# Patient Record
Sex: Female | Born: 1965 | Race: White | Hispanic: No | Marital: Single | Smoking: Never smoker
Health system: Southern US, Community
[De-identification: ages and names within clinical notes are randomized; demographics above are authoritative.]

## PROBLEM LIST (undated history)

## (undated) DIAGNOSIS — I639 Cerebral infarction, unspecified: Secondary | ICD-10-CM

## (undated) DIAGNOSIS — E7800 Pure hypercholesterolemia, unspecified: Secondary | ICD-10-CM

## (undated) DIAGNOSIS — R569 Unspecified convulsions: Secondary | ICD-10-CM

## (undated) DIAGNOSIS — I509 Heart failure, unspecified: Secondary | ICD-10-CM

## (undated) DIAGNOSIS — F329 Major depressive disorder, single episode, unspecified: Secondary | ICD-10-CM

## (undated) DIAGNOSIS — E785 Hyperlipidemia, unspecified: Secondary | ICD-10-CM

## (undated) DIAGNOSIS — I2510 Atherosclerotic heart disease of native coronary artery without angina pectoris: Secondary | ICD-10-CM

## (undated) DIAGNOSIS — F32A Depression, unspecified: Secondary | ICD-10-CM

## (undated) DIAGNOSIS — G43909 Migraine, unspecified, not intractable, without status migrainosus: Secondary | ICD-10-CM

## (undated) DIAGNOSIS — I472 Ventricular tachycardia, unspecified: Secondary | ICD-10-CM

## (undated) DIAGNOSIS — E669 Obesity, unspecified: Secondary | ICD-10-CM

## (undated) DIAGNOSIS — J45909 Unspecified asthma, uncomplicated: Secondary | ICD-10-CM

## (undated) DIAGNOSIS — I82409 Acute embolism and thrombosis of unspecified deep veins of unspecified lower extremity: Secondary | ICD-10-CM

## (undated) DIAGNOSIS — F419 Anxiety disorder, unspecified: Secondary | ICD-10-CM

## (undated) DIAGNOSIS — M5030 Other cervical disc degeneration, unspecified cervical region: Secondary | ICD-10-CM

## (undated) DIAGNOSIS — T7840XA Allergy, unspecified, initial encounter: Secondary | ICD-10-CM

## (undated) DIAGNOSIS — I469 Cardiac arrest, cause unspecified: Secondary | ICD-10-CM

## (undated) DIAGNOSIS — I10 Essential (primary) hypertension: Secondary | ICD-10-CM

## (undated) DIAGNOSIS — K5790 Diverticulosis of intestine, part unspecified, without perforation or abscess without bleeding: Secondary | ICD-10-CM

## (undated) DIAGNOSIS — K219 Gastro-esophageal reflux disease without esophagitis: Secondary | ICD-10-CM

## (undated) HISTORY — DX: Unspecified asthma, uncomplicated: J45.909

## (undated) HISTORY — PX: ABDOMINAL HYSTERECTOMY: SHX81

## (undated) HISTORY — PX: HAND SURGERY: SHX662

## (undated) HISTORY — DX: Cardiac arrest, cause unspecified: I46.9

## (undated) HISTORY — PX: OVARIAN CYST REMOVAL: SHX89

## (undated) HISTORY — DX: Allergy, unspecified, initial encounter: T78.40XA

---

## 1996-08-25 HISTORY — PX: TUBAL LIGATION: SHX77

## 2004-11-24 ENCOUNTER — Emergency Department: Payer: Self-pay | Admitting: Emergency Medicine

## 2004-11-25 ENCOUNTER — Ambulatory Visit: Payer: Self-pay | Admitting: Emergency Medicine

## 2004-12-04 ENCOUNTER — Ambulatory Visit (HOSPITAL_COMMUNITY): Admission: RE | Admit: 2004-12-04 | Discharge: 2004-12-04 | Payer: Self-pay | Admitting: Gynecology

## 2005-03-27 ENCOUNTER — Emergency Department: Payer: Self-pay | Admitting: Unknown Physician Specialty

## 2005-03-27 ENCOUNTER — Other Ambulatory Visit: Payer: Self-pay

## 2006-05-11 ENCOUNTER — Ambulatory Visit: Payer: Self-pay | Admitting: Gynecology

## 2006-06-09 ENCOUNTER — Ambulatory Visit: Payer: Self-pay | Admitting: Pain Medicine

## 2006-06-17 ENCOUNTER — Ambulatory Visit: Payer: Self-pay | Admitting: Pain Medicine

## 2006-09-08 ENCOUNTER — Ambulatory Visit: Payer: Self-pay | Admitting: Pain Medicine

## 2006-09-21 ENCOUNTER — Ambulatory Visit: Payer: Self-pay | Admitting: Pain Medicine

## 2006-11-03 ENCOUNTER — Ambulatory Visit: Payer: Self-pay | Admitting: Pain Medicine

## 2006-11-11 ENCOUNTER — Ambulatory Visit: Payer: Self-pay | Admitting: Pain Medicine

## 2006-12-24 ENCOUNTER — Ambulatory Visit: Payer: Self-pay | Admitting: Pain Medicine

## 2006-12-28 ENCOUNTER — Ambulatory Visit: Payer: Self-pay | Admitting: Pain Medicine

## 2007-02-09 ENCOUNTER — Ambulatory Visit: Payer: Self-pay | Admitting: Pain Medicine

## 2007-02-15 ENCOUNTER — Ambulatory Visit: Payer: Self-pay | Admitting: Pain Medicine

## 2007-03-25 ENCOUNTER — Ambulatory Visit: Payer: Self-pay | Admitting: Pain Medicine

## 2007-03-29 ENCOUNTER — Ambulatory Visit: Payer: Self-pay | Admitting: Pain Medicine

## 2007-05-20 ENCOUNTER — Ambulatory Visit: Payer: Self-pay | Admitting: Pain Medicine

## 2007-06-02 ENCOUNTER — Ambulatory Visit: Payer: Self-pay | Admitting: Pain Medicine

## 2007-07-08 ENCOUNTER — Ambulatory Visit: Payer: Self-pay | Admitting: Pain Medicine

## 2007-07-14 ENCOUNTER — Ambulatory Visit: Payer: Self-pay | Admitting: Pain Medicine

## 2007-09-07 ENCOUNTER — Ambulatory Visit: Payer: Self-pay | Admitting: Pain Medicine

## 2007-09-20 ENCOUNTER — Ambulatory Visit: Payer: Self-pay | Admitting: Pain Medicine

## 2007-10-07 ENCOUNTER — Ambulatory Visit: Payer: Self-pay | Admitting: Pain Medicine

## 2007-10-11 ENCOUNTER — Ambulatory Visit: Payer: Self-pay | Admitting: Pain Medicine

## 2007-11-04 ENCOUNTER — Ambulatory Visit: Payer: Self-pay | Admitting: Pain Medicine

## 2007-11-08 ENCOUNTER — Ambulatory Visit: Payer: Self-pay | Admitting: Pain Medicine

## 2007-11-21 ENCOUNTER — Encounter: Admission: RE | Admit: 2007-11-21 | Discharge: 2007-11-21 | Payer: Self-pay | Admitting: Neurosurgery

## 2007-12-07 ENCOUNTER — Ambulatory Visit: Payer: Self-pay | Admitting: Pain Medicine

## 2007-12-08 ENCOUNTER — Ambulatory Visit: Payer: Self-pay | Admitting: Pain Medicine

## 2008-01-13 ENCOUNTER — Ambulatory Visit: Payer: Self-pay | Admitting: Pain Medicine

## 2008-01-17 ENCOUNTER — Ambulatory Visit: Payer: Self-pay | Admitting: Pain Medicine

## 2008-02-12 ENCOUNTER — Emergency Department: Payer: Self-pay | Admitting: Emergency Medicine

## 2008-02-17 ENCOUNTER — Ambulatory Visit: Payer: Self-pay | Admitting: Pain Medicine

## 2008-02-23 ENCOUNTER — Ambulatory Visit: Payer: Self-pay | Admitting: Pain Medicine

## 2008-03-14 ENCOUNTER — Ambulatory Visit: Payer: Self-pay | Admitting: Pain Medicine

## 2008-03-14 ENCOUNTER — Emergency Department: Payer: Self-pay | Admitting: Internal Medicine

## 2008-03-20 ENCOUNTER — Ambulatory Visit: Payer: Self-pay | Admitting: Pain Medicine

## 2008-05-09 ENCOUNTER — Ambulatory Visit: Payer: Self-pay | Admitting: Pain Medicine

## 2008-05-15 ENCOUNTER — Ambulatory Visit: Payer: Self-pay | Admitting: Pain Medicine

## 2008-06-26 ENCOUNTER — Ambulatory Visit: Payer: Self-pay | Admitting: Pain Medicine

## 2008-08-08 ENCOUNTER — Ambulatory Visit: Payer: Self-pay | Admitting: Pain Medicine

## 2008-08-14 ENCOUNTER — Ambulatory Visit: Payer: Self-pay | Admitting: Pain Medicine

## 2008-08-28 ENCOUNTER — Ambulatory Visit: Payer: Self-pay | Admitting: Pain Medicine

## 2008-09-28 ENCOUNTER — Ambulatory Visit: Payer: Self-pay | Admitting: Pain Medicine

## 2008-11-01 ENCOUNTER — Ambulatory Visit: Payer: Self-pay | Admitting: Pain Medicine

## 2008-12-04 ENCOUNTER — Ambulatory Visit: Payer: Self-pay | Admitting: Pain Medicine

## 2009-01-01 ENCOUNTER — Ambulatory Visit: Payer: Self-pay | Admitting: Pain Medicine

## 2009-01-25 ENCOUNTER — Ambulatory Visit: Payer: Self-pay | Admitting: Pain Medicine

## 2009-02-28 ENCOUNTER — Ambulatory Visit: Payer: Self-pay | Admitting: Pain Medicine

## 2009-05-02 ENCOUNTER — Ambulatory Visit: Payer: Self-pay | Admitting: Pain Medicine

## 2009-07-23 ENCOUNTER — Ambulatory Visit: Payer: Self-pay | Admitting: Pain Medicine

## 2009-08-22 ENCOUNTER — Ambulatory Visit: Payer: Self-pay | Admitting: Pain Medicine

## 2009-10-08 ENCOUNTER — Ambulatory Visit: Payer: Self-pay | Admitting: Pain Medicine

## 2010-01-14 ENCOUNTER — Ambulatory Visit: Payer: Self-pay | Admitting: Pain Medicine

## 2010-02-14 ENCOUNTER — Ambulatory Visit: Payer: Self-pay | Admitting: Pain Medicine

## 2010-02-18 ENCOUNTER — Ambulatory Visit: Payer: Self-pay | Admitting: Pain Medicine

## 2010-05-15 ENCOUNTER — Emergency Department: Payer: Self-pay | Admitting: Emergency Medicine

## 2010-05-20 ENCOUNTER — Ambulatory Visit: Payer: Self-pay | Admitting: Pain Medicine

## 2010-05-30 ENCOUNTER — Ambulatory Visit: Payer: Self-pay

## 2010-06-19 ENCOUNTER — Ambulatory Visit: Payer: Self-pay | Admitting: Pain Medicine

## 2010-06-21 ENCOUNTER — Ambulatory Visit: Payer: Self-pay | Admitting: Pain Medicine

## 2010-07-09 ENCOUNTER — Ambulatory Visit: Payer: Self-pay | Admitting: Pain Medicine

## 2010-07-15 ENCOUNTER — Ambulatory Visit: Payer: Self-pay | Admitting: Pain Medicine

## 2010-07-16 ENCOUNTER — Encounter: Payer: Self-pay | Admitting: Surgery

## 2010-07-25 ENCOUNTER — Encounter: Payer: Self-pay | Admitting: Surgery

## 2010-08-20 ENCOUNTER — Ambulatory Visit: Payer: Self-pay | Admitting: Pain Medicine

## 2010-08-28 ENCOUNTER — Ambulatory Visit: Payer: Self-pay | Admitting: Pain Medicine

## 2010-08-29 ENCOUNTER — Encounter: Payer: Self-pay | Admitting: Surgery

## 2010-09-25 ENCOUNTER — Encounter: Payer: Self-pay | Admitting: Surgery

## 2010-10-07 ENCOUNTER — Ambulatory Visit: Payer: Self-pay | Admitting: Pain Medicine

## 2010-10-30 ENCOUNTER — Ambulatory Visit: Payer: Self-pay | Admitting: Pain Medicine

## 2010-11-27 ENCOUNTER — Ambulatory Visit: Payer: Self-pay | Admitting: Pain Medicine

## 2010-12-26 ENCOUNTER — Ambulatory Visit: Payer: Self-pay | Admitting: Pain Medicine

## 2010-12-30 ENCOUNTER — Ambulatory Visit: Payer: Self-pay | Admitting: Pain Medicine

## 2010-12-31 ENCOUNTER — Emergency Department (HOSPITAL_COMMUNITY)
Admission: EM | Admit: 2010-12-31 | Discharge: 2010-12-31 | Disposition: A | Discharge status: Home / Self Care | Payer: Medicaid Other | Attending: Emergency Medicine | Admitting: Emergency Medicine

## 2010-12-31 DIAGNOSIS — I10 Essential (primary) hypertension: Secondary | ICD-10-CM | POA: Insufficient documentation

## 2010-12-31 DIAGNOSIS — M549 Dorsalgia, unspecified: Secondary | ICD-10-CM | POA: Insufficient documentation

## 2010-12-31 DIAGNOSIS — F3289 Other specified depressive episodes: Secondary | ICD-10-CM | POA: Insufficient documentation

## 2010-12-31 DIAGNOSIS — F329 Major depressive disorder, single episode, unspecified: Secondary | ICD-10-CM | POA: Insufficient documentation

## 2011-01-08 ENCOUNTER — Emergency Department: Payer: Self-pay | Admitting: Unknown Physician Specialty

## 2011-01-10 ENCOUNTER — Emergency Department: Payer: Self-pay | Admitting: Emergency Medicine

## 2011-01-10 NOTE — Op Note (Signed)
[name], Claire Mcdonald                   ACCOUNT NO.:  [id number]   MEDICAL RECORD NO.:  [id number]          PATIENT TYPE:  AMB   LOCATION:  SDC                           FACILITY:  [facility]   PHYSICIAN:  Ginger Carne, MD  DATE OF BIRTH:  1966-08-04   DATE OF PROCEDURE:  12/04/2004  DATE OF DISCHARGE:                                 OPERATIVE REPORT   PREOPERATIVE DIAGNOSES:  1.  Chronic pelvic pain.  2.  Right ovarian complex cystic mass.   POSTOPERATIVE DIAGNOSES:  1.  Stage I endometriosis.  2.  Right ovarian corpus luteal cyst.   PROCEDURE:  Diagnostic laparoscopy.   SURGEON:  Ginger Carne, MD   ASSISTANT:  None.   COMPLICATIONS:  None immediate.   ESTIMATED BLOOD LOSS:  Negligible.   SPECIMENS:  None.   ASSISTANT:  None.   OPERATIVE FINDINGS:  External genitalia, vulva and vagina normal.  Cervix  smooth without erosions or lesions.  Laparoscopic evaluation demonstrated  stage I endometriosis with red punctate lesions on the broad ligaments  bilaterally posteriorly as well as the uterosacral ligaments and posterior  aspect of the uterus.  Both tubes and ovaries are free of endometriotic  lesions.  The right ovary demonstrated a 2.3 cm corpus luteal cyst that was  intact and hemorrhagic.  The left tube and ovary appeared otherwise normal,  upper abdomen normal, large and small bowel grossly normal, appendix normal.  No evidence of femoral, inguinal or obturator hernias.  Uterine contour was  smooth.   OPERATIVE PROCEDURE:  The patient was prepped and draped in the usual  fashion and placed in lithotomy position, Betadine solution used for  antiseptic, and the patient was catheterized prior to the procedure.  After  adequate general anesthesia, a tenaculum placed on the anterior lip of the  cervix and an acorn tenaculum in the endocervical canal.  A vertical  infraumbilical incision was made and the Veress needle placed in the  abdomen, and opening and closing  pressures were 10-15 mmHg.  A medial  release trocar placed in the same incision and laparoscope placed in the  trocar sleeve.  A 5 mm port was made in the left lower quadrant under direct  visualization.  Inspection of the pelvic contents with photography carried  out, including was the upper abdominal evaluation.  Afterwards gas released, trocars removed.  Closure of the 10 mm fascial site  with 0 Vicryl suture and a 4-0 Vicryl for subcuticular closure.  Instrument  and sponge count were correct.  The patient tolerated the procedure well and  returned to the postanesthesia recovery room in excellent condition.      SHB/MEDQ  D:  12/04/2004  T:  12/04/2004  Job:  [id number]

## 2011-01-10 NOTE — H&P (Signed)
NAME:  Claire Mcdonald, CHANNING NO.:  [id number]   MEDICAL RECORD NO.:  [id number]          PATIENT TYPE:  AMB   LOCATION:  SDC                           [facility]ACILITY:  [facility]   PHYSICIAN:  Ginger Carne, MD  DATE OF BIRTH:  [birth year]   DATE OF ADMISSION:  DATE OF DISCHARGE:                                HISTORY & PHYSICAL   ADMISSION DIAGNOSIS:  Chronic pelvic pain.   HISTORY OF PRESENT ILLNESS:  This patient is a 45 year old gravida 4, para 5-  0-0-4, Caucasian female admitted with a two to three-year history of  worsening bilateral pelvic pain between her menses as well as dysmenorrhea  and dyspareunia.  She noted the onset of increasing discomfort over the past  month and was seen in the emergency room at [facility], at which time a sonogram was performed on November 25, 2004, which  demonstrated a right complex cystic mass measuring 4.6 cm.  She complains of  discomfort primarily on the left side; however, both sides are involved with  discomfort.  The CA125 was 12.7, within normal range.  She has no  genitourinary, gastrointestinal or musculoskeletal sources for her pain.   OBSTETRIC/GYNECOLOGIC HISTORY:  The patient has had four full-term vaginal  deliveries between 1987 and 1998.  She had a tubal ligation postpartum in  1998.   PAST SURGICAL HISTORY:  A left hand surgery in 2005.   SOCIAL HISTORY:  Negative for smoking, illicit drug abuse or alcohol abuse.   ALLERGIES:  None.   CURRENT MEDICATIONS:  Vicodin one to two every four to six hours as needed  for pain.   FAMILY HISTORY:  Father has hypertension.   REVIEW OF SYSTEMS:  Negative.   PAST MEDICAL HISTORY:  Negative.   PHYSICAL EXAMINATION:  GENERAL:  A pleasant-appearing female in no acute  distress.  VITAL SIGNS:  Blood pressure is 132/74, weight 218 pounds, height 5 feet 4  inches.  HEENT:  Grossly normal.  CHEST:  Clear.  CARDIAC:  Without murmurs or enlargements.  BREASTS:   Without masses, discharge, thickenings or tenderness.  EXTREMITIES, LYMPHATIC, SKIN, NEUROLOGIC, MUSCULOSKELETAL:  Normal.  ABDOMEN:  Soft without gross hepatosplenomegaly.  There is tenderness  bilaterally in the lower abdominal quadrants.  PELVIC:  Normal Pap smear.  Urinalysis normal.  External genitalia, vulva  and vagina normal.  Cervix smooth without erosions or lesions.  Uterus is  small, anteverted and flexed, and both adnexa are palpable and found to be  normal.  RECTAL:  Hemoccult-negative without masses.   IMPRESSION:  Chronic pelvic pain with acute exacerbation.   PLAN:  The patient will undergo a diagnostic laparoscopy for purposes of  evaluating the etiology for her pain.  The nature of said procedure  discussed in detail with the patient, all questions answered to the  satisfaction of said patient.      SHB/MEDQ  D:  12/03/2004  T:  12/03/2004  Job:  [id number]

## 2011-01-22 ENCOUNTER — Emergency Department (HOSPITAL_COMMUNITY)
Admission: EM | Admit: 2011-01-22 | Discharge: 2011-01-22 | Disposition: A | Discharge status: Home / Self Care | Payer: Medicaid Other | Attending: Emergency Medicine | Admitting: Emergency Medicine

## 2011-01-22 ENCOUNTER — Emergency Department (HOSPITAL_COMMUNITY): Payer: Medicaid Other

## 2011-01-22 DIAGNOSIS — M549 Dorsalgia, unspecified: Secondary | ICD-10-CM | POA: Insufficient documentation

## 2011-01-22 DIAGNOSIS — F3289 Other specified depressive episodes: Secondary | ICD-10-CM | POA: Insufficient documentation

## 2011-01-22 DIAGNOSIS — I10 Essential (primary) hypertension: Secondary | ICD-10-CM | POA: Insufficient documentation

## 2011-01-22 DIAGNOSIS — N390 Urinary tract infection, site not specified: Secondary | ICD-10-CM | POA: Insufficient documentation

## 2011-01-22 DIAGNOSIS — R109 Unspecified abdominal pain: Secondary | ICD-10-CM | POA: Insufficient documentation

## 2011-01-22 DIAGNOSIS — F329 Major depressive disorder, single episode, unspecified: Secondary | ICD-10-CM | POA: Insufficient documentation

## 2011-01-22 LAB — URINALYSIS, ROUTINE W REFLEX MICROSCOPIC
Glucose, UA: NEGATIVE mg/dL
Ketones, ur: NEGATIVE mg/dL
Nitrite: NEGATIVE
Urobilinogen, UA: 0.2 mg/dL (ref 0.0–1.0)
pH: 5 (ref 5.0–8.0)

## 2011-01-22 LAB — PREGNANCY, URINE: Preg Test, Ur: NEGATIVE

## 2011-01-22 LAB — URINE MICROSCOPIC-ADD ON

## 2011-01-28 ENCOUNTER — Ambulatory Visit: Payer: Self-pay | Admitting: Pain Medicine

## 2012-06-21 ENCOUNTER — Emergency Department: Payer: Self-pay | Admitting: Emergency Medicine

## 2012-06-22 LAB — URINALYSIS, COMPLETE
Bilirubin,UR: NEGATIVE
Blood: NEGATIVE
Glucose,UR: NEGATIVE mg/dL (ref 0–75)
Leukocyte Esterase: NEGATIVE
Nitrite: NEGATIVE
Ph: 7 (ref 4.5–8.0)
Protein: NEGATIVE
Specific Gravity: 1.02 (ref 1.003–1.030)
WBC UR: NONE SEEN /HPF (ref 0–5)

## 2012-07-30 ENCOUNTER — Emergency Department: Payer: Self-pay | Admitting: Emergency Medicine

## 2012-10-12 ENCOUNTER — Emergency Department: Payer: Self-pay | Admitting: Emergency Medicine

## 2012-10-27 ENCOUNTER — Emergency Department: Payer: Self-pay | Admitting: Internal Medicine

## 2012-11-04 ENCOUNTER — Emergency Department: Payer: Self-pay | Admitting: Emergency Medicine

## 2012-11-04 LAB — HCG, QUANTITATIVE, PREGNANCY: Beta Hcg, Quant.: <1 m[IU]/mL — ABNORMAL LOW

## 2012-11-19 ENCOUNTER — Emergency Department: Payer: Self-pay | Admitting: Emergency Medicine

## 2012-11-24 ENCOUNTER — Emergency Department: Payer: Self-pay | Admitting: Emergency Medicine

## 2012-11-24 LAB — COMPREHENSIVE METABOLIC PANEL
Alkaline Phosphatase: 93 U/L (ref 50–136)
BUN: 14 mg/dL (ref 7–18)
Bilirubin,Total: 0.3 mg/dL (ref 0.2–1.0)
Calcium, Total: 8.4 mg/dL — ABNORMAL LOW (ref 8.5–10.1)
Chloride: 107 mmol/L (ref 98–107)
Creatinine: 0.63 mg/dL (ref 0.60–1.30)
EGFR (Non-African Amer.): >60
Glucose: 100 mg/dL — ABNORMAL HIGH (ref 65–99)
Osmolality: 276 (ref 275–301)
SGPT (ALT): 25 U/L (ref 12–78)
Sodium: 138 mmol/L (ref 136–145)
Total Protein: 7.4 g/dL (ref 6.4–8.2)

## 2012-11-24 LAB — URINALYSIS, COMPLETE
Nitrite: NEGATIVE
Ph: 5 (ref 4.5–8.0)
RBC,UR: 1 /HPF (ref 0–5)
Specific Gravity: 1.006 (ref 1.003–1.030)
Squamous Epithelial: 29
WBC UR: 1 /HPF (ref 0–5)

## 2012-11-24 LAB — CBC
HCT: 43.6 % (ref 35.0–47.0)
HGB: 14.7 g/dL (ref 12.0–16.0)
MCH: 30.6 pg (ref 26.0–34.0)
MCHC: 33.8 g/dL (ref 32.0–36.0)
RBC: 4.82 10*6/uL (ref 3.80–5.20)
RDW: 13 % (ref 11.5–14.5)

## 2012-11-30 ENCOUNTER — Emergency Department: Payer: Self-pay | Admitting: Emergency Medicine

## 2012-11-30 LAB — COMPREHENSIVE METABOLIC PANEL
Albumin: 3.2 g/dL — ABNORMAL LOW (ref 3.4–5.0)
Alkaline Phosphatase: 77 U/L (ref 50–136)
Anion Gap: 4 — ABNORMAL LOW (ref 7–16)
BUN: 17 mg/dL (ref 7–18)
Bilirubin,Total: 0.3 mg/dL (ref 0.2–1.0)
Calcium, Total: 8.5 mg/dL (ref 8.5–10.1)
Chloride: 102 mmol/L (ref 98–107)
Creatinine: 0.85 mg/dL (ref 0.60–1.30)
EGFR (African American): >60
EGFR (Non-African Amer.): >60
Glucose: 99 mg/dL (ref 65–99)
Osmolality: 274 (ref 275–301)
SGOT(AST): 21 U/L (ref 15–37)

## 2012-11-30 LAB — URINALYSIS, COMPLETE
Bilirubin,UR: NEGATIVE
Blood: NEGATIVE
Glucose,UR: NEGATIVE mg/dL (ref 0–75)
Ketone: NEGATIVE
Nitrite: NEGATIVE
Ph: 6 (ref 4.5–8.0)
Protein: NEGATIVE
RBC,UR: 1 /HPF (ref 0–5)
Squamous Epithelial: 40

## 2012-11-30 LAB — CBC
HCT: 38.3 % (ref 35.0–47.0)
HGB: 13.3 g/dL (ref 12.0–16.0)
MCHC: 34.6 g/dL (ref 32.0–36.0)
Platelet: 291 10*3/uL (ref 150–440)
RDW: 12.9 % (ref 11.5–14.5)

## 2012-11-30 LAB — LIPASE, BLOOD: Lipase: 163 U/L (ref 73–393)

## 2012-12-02 ENCOUNTER — Ambulatory Visit: Payer: Self-pay

## 2012-12-02 ENCOUNTER — Emergency Department: Payer: Self-pay | Admitting: Unknown Physician Specialty

## 2012-12-02 LAB — URINALYSIS, COMPLETE
Glucose,UR: NEGATIVE mg/dL (ref 0–75)
Ketone: NEGATIVE
Leukocyte Esterase: NEGATIVE
Ph: 8 (ref 4.5–8.0)
RBC,UR: 3 /HPF (ref 0–5)
Specific Gravity: 1.036 (ref 1.003–1.030)
WBC UR: 3 /HPF (ref 0–5)

## 2012-12-02 LAB — COMPREHENSIVE METABOLIC PANEL
Albumin: 3.4 g/dL (ref 3.4–5.0)
Alkaline Phosphatase: 73 U/L (ref 50–136)
Anion Gap: 6 — ABNORMAL LOW (ref 7–16)
BUN: 10 mg/dL (ref 7–18)
Calcium, Total: 8.6 mg/dL (ref 8.5–10.1)
Creatinine: 0.69 mg/dL (ref 0.60–1.30)
EGFR (Non-African Amer.): >60
Glucose: 95 mg/dL (ref 65–99)
Osmolality: 284 (ref 275–301)
Potassium: 3.8 mmol/L (ref 3.5–5.1)
SGOT(AST): 24 U/L (ref 15–37)
Sodium: 143 mmol/L (ref 136–145)
Total Protein: 6.8 g/dL (ref 6.4–8.2)

## 2012-12-02 LAB — LIPASE, BLOOD: Lipase: 135 U/L (ref 73–393)

## 2012-12-02 LAB — CREATININE, SERUM
Creatinine: 0.78 mg/dL (ref 0.60–1.30)
EGFR (Non-African Amer.): >60

## 2012-12-02 LAB — CBC
HCT: 40.5 % (ref 35.0–47.0)
HGB: 13.6 g/dL (ref 12.0–16.0)
MCV: 92 fL (ref 80–100)
Platelet: 316 10*3/uL (ref 150–440)
RBC: 4.4 10*6/uL (ref 3.80–5.20)
RDW: 13.2 % (ref 11.5–14.5)
WBC: 7.7 10*3/uL (ref 3.6–11.0)

## 2012-12-04 ENCOUNTER — Encounter (HOSPITAL_COMMUNITY): Payer: Self-pay | Admitting: Emergency Medicine

## 2012-12-04 ENCOUNTER — Emergency Department: Payer: Self-pay | Admitting: Emergency Medicine

## 2012-12-04 ENCOUNTER — Emergency Department (HOSPITAL_COMMUNITY)
Admission: EM | Admit: 2012-12-04 | Discharge: 2012-12-04 | Disposition: A | Discharge status: Home / Self Care | Payer: Medicaid Other | Attending: Emergency Medicine | Admitting: Emergency Medicine

## 2012-12-04 DIAGNOSIS — Z79899 Other long term (current) drug therapy: Secondary | ICD-10-CM | POA: Insufficient documentation

## 2012-12-04 DIAGNOSIS — Z8639 Personal history of other endocrine, nutritional and metabolic disease: Secondary | ICD-10-CM | POA: Insufficient documentation

## 2012-12-04 DIAGNOSIS — R102 Pelvic and perineal pain: Secondary | ICD-10-CM

## 2012-12-04 DIAGNOSIS — Z3202 Encounter for pregnancy test, result negative: Secondary | ICD-10-CM | POA: Insufficient documentation

## 2012-12-04 DIAGNOSIS — R197 Diarrhea, unspecified: Secondary | ICD-10-CM | POA: Insufficient documentation

## 2012-12-04 DIAGNOSIS — F329 Major depressive disorder, single episode, unspecified: Secondary | ICD-10-CM | POA: Insufficient documentation

## 2012-12-04 DIAGNOSIS — F411 Generalized anxiety disorder: Secondary | ICD-10-CM | POA: Insufficient documentation

## 2012-12-04 DIAGNOSIS — N949 Unspecified condition associated with female genital organs and menstrual cycle: Secondary | ICD-10-CM | POA: Insufficient documentation

## 2012-12-04 DIAGNOSIS — K921 Melena: Secondary | ICD-10-CM | POA: Insufficient documentation

## 2012-12-04 DIAGNOSIS — Z862 Personal history of diseases of the blood and blood-forming organs and certain disorders involving the immune mechanism: Secondary | ICD-10-CM | POA: Insufficient documentation

## 2012-12-04 DIAGNOSIS — I10 Essential (primary) hypertension: Secondary | ICD-10-CM | POA: Insufficient documentation

## 2012-12-04 DIAGNOSIS — F3289 Other specified depressive episodes: Secondary | ICD-10-CM | POA: Insufficient documentation

## 2012-12-04 DIAGNOSIS — R5381 Other malaise: Secondary | ICD-10-CM | POA: Insufficient documentation

## 2012-12-04 DIAGNOSIS — R109 Unspecified abdominal pain: Secondary | ICD-10-CM | POA: Insufficient documentation

## 2012-12-04 DIAGNOSIS — R112 Nausea with vomiting, unspecified: Secondary | ICD-10-CM | POA: Insufficient documentation

## 2012-12-04 HISTORY — DX: Anxiety disorder, unspecified: F41.9

## 2012-12-04 HISTORY — DX: Pure hypercholesterolemia, unspecified: E78.00

## 2012-12-04 HISTORY — DX: Depression, unspecified: F32.A

## 2012-12-04 HISTORY — DX: Major depressive disorder, single episode, unspecified: F32.9

## 2012-12-04 HISTORY — DX: Essential (primary) hypertension: I10

## 2012-12-04 LAB — CBC WITH DIFFERENTIAL/PLATELET
Basophils Relative: 0 % (ref 0–1)
Eosinophils Absolute: 0.2 10*3/uL (ref 0.0–0.7)
HCT: 39.1 % (ref 36.0–46.0)
Hemoglobin: 13.6 g/dL (ref 12.0–15.0)
MCH: 31.2 pg (ref 26.0–34.0)
MCHC: 34.8 g/dL (ref 30.0–36.0)
Monocytes Absolute: 0.6 10*3/uL (ref 0.1–1.0)
Monocytes Relative: 7 % (ref 3–12)
Neutrophils Relative %: 58 % (ref 43–77)
RDW: 12.7 % (ref 11.5–15.5)
WBC: 7.8 10*3/uL (ref 4.0–10.5)

## 2012-12-04 LAB — COMPREHENSIVE METABOLIC PANEL
Albumin: 3.4 g/dL (ref 3.4–5.0)
Alkaline Phosphatase: 77 U/L (ref 50–136)
Bilirubin,Total: 0.2 mg/dL (ref 0.2–1.0)
Calcium, Total: 8.6 mg/dL (ref 8.5–10.1)
Chloride: 110 mmol/L — ABNORMAL HIGH (ref 98–107)
Co2: 25 mmol/L (ref 21–32)
EGFR (African American): >60
EGFR (Non-African Amer.): >60
Glucose: 146 mg/dL — ABNORMAL HIGH (ref 65–99)
Potassium: 3.6 mmol/L (ref 3.5–5.1)
SGOT(AST): 33 U/L (ref 15–37)
SGPT (ALT): 29 U/L (ref 12–78)

## 2012-12-04 LAB — WET PREP, GENITAL: Trich, Wet Prep: NONE SEEN

## 2012-12-04 LAB — POCT I-STAT, CHEM 8
Calcium, Ion: 1.25 mmol/L — ABNORMAL HIGH (ref 1.12–1.23)
Creatinine, Ser: 0.7 mg/dL (ref 0.50–1.10)
Glucose, Bld: 95 mg/dL (ref 70–99)
HCT: 40 % (ref 36.0–46.0)
Hemoglobin: 13.6 g/dL (ref 12.0–15.0)
Potassium: 4 mEq/L (ref 3.5–5.1)
Sodium: 145 mEq/L (ref 135–145)
TCO2: 27 mmol/L (ref 0–100)

## 2012-12-04 LAB — CK TOTAL AND CKMB (NOT AT ARMC): CK, Total: 50 U/L (ref 21–215)

## 2012-12-04 LAB — URINALYSIS, ROUTINE W REFLEX MICROSCOPIC
Bilirubin Urine: NEGATIVE
Glucose, UA: NEGATIVE mg/dL
Hgb urine dipstick: NEGATIVE
Protein, ur: NEGATIVE mg/dL
Urobilinogen, UA: 1 mg/dL (ref 0.0–1.0)

## 2012-12-04 LAB — TROPONIN I: Troponin-I: <0.02 ng/mL

## 2012-12-04 LAB — CBC
HCT: 41.3 % (ref 35.0–47.0)
HGB: 13.5 g/dL (ref 12.0–16.0)
MCH: 30.1 pg (ref 26.0–34.0)
Platelet: 317 10*3/uL (ref 150–440)
WBC: 8.5 10*3/uL (ref 3.6–11.0)

## 2012-12-04 MED ORDER — HYDROMORPHONE HCL PF 1 MG/ML IJ SOLN
1.0000 mg | Freq: Once | INTRAMUSCULAR | Status: AC
  Administered 2012-12-04: 1 mg via INTRAVENOUS
  Filled 2012-12-04: qty 1

## 2012-12-04 MED ORDER — AZITHROMYCIN 250 MG PO TABS
1000.0000 mg | ORAL_TABLET | Freq: Once | ORAL | Status: AC
  Administered 2012-12-04: 1000 mg via ORAL
  Filled 2012-12-04: qty 4

## 2012-12-04 MED ORDER — CEFTRIAXONE SODIUM 250 MG IJ SOLR
250.0000 mg | Freq: Once | INTRAMUSCULAR | Status: AC
  Administered 2012-12-04: 250 mg via INTRAMUSCULAR
  Filled 2012-12-04: qty 250

## 2012-12-04 MED ORDER — PROMETHAZINE HCL 25 MG/ML IJ SOLN
25.0000 mg | Freq: Once | INTRAMUSCULAR | Status: AC
  Administered 2012-12-04: 25 mg via INTRAVENOUS
  Filled 2012-12-04: qty 1

## 2012-12-04 MED ORDER — LIDOCAINE HCL (PF) 1 % IJ SOLN
INTRAMUSCULAR | Status: AC
  Administered 2012-12-04: 0.9 mL
  Filled 2012-12-04: qty 5

## 2012-12-04 NOTE — ED Notes (Signed)
Discharge and follow up instructions reviewed. Pt verbalized understanding.  

## 2012-12-04 NOTE — ED Notes (Signed)
[place]ie PA at bedside 

## 2012-12-04 NOTE — ED Notes (Addendum)
Pt c/o multiple sx. Rt flank pain, hematuria, blood in stool and emesis, and pelvic pain x 2wks. Pt rates pain 10/10.

## 2012-12-04 NOTE — ED Notes (Signed)
Per [facility] pt c/o pelvic pain, rt flank pain, weakness, blood in urine, blood in stool, and blood in emesis x 2wks. Pt is A&O x4, vitals stable. Vitals 120/80, 103 P, 99% RA, Resp 18 and regular. Pt rates pain 10/10. Allergic to zofran, tramadol, and toradol all make her itch and break out in hives. Pt has hx of HTN, High Cholesterol, Panic attacks,depression and neuropathy.

## 2012-12-04 NOTE — ED Provider Notes (Signed)
History     CSN: [id number]  Arrival date & time 12/04/12  1750   First MD Initiated Contact with Patient 12/04/12 1752      Chief Complaint  Patient presents with  . Abdominal Pain  . Pelvic Pain  . Weakness    (Consider location/radiation/quality/duration/timing/severity/associated sxs/prior treatment) HPI  47 year old female presents complaining of abdominal and pelvis pain. Patient reports for the past 2 weeks she has had persistent pain to her low abdomen. Describe pain as an achy sensation, persistent, nonradiating, 10 out of 10, worsening with sexual activities. She also report having blood in the urine and blood in the stool. She has had nausea, vomiting, and diarrhea as well. Saw trace of blood in her emesis. She also endorsed fever as high as 104 last night, improves after she took a Tylenol. Patient denies headache, sneezing, coughing, chest pain, shortness of breath, dysuria, vaginal discharge, or rash. No prior history of STD. Does have prior history of ovarian cyst.  Past Medical History  Diagnosis Date  . Hypertension   . High cholesterol   . Anxiety   . Depression     Past Surgical History  Procedure Laterality Date  . Tubal ligation  1998    History reviewed. No pertinent family history.  History  Substance Use Topics  . Smoking status: Never Smoker   . Smokeless tobacco: Never Used  . Alcohol Use: No    OB History   Grav Para Term Preterm Abortions TAB SAB Ect Mult Living                  Review of Systems  Constitutional:       10 Systems reviewed and all are negative for acute change except as noted in the HPI.     Allergies  Toradol; Tramadol; and Zofran  Home Medications   Current Outpatient Rx  Name  Route  Sig  Dispense  Refill  . acetaminophen (TYLENOL) 500 MG tablet   Oral   Take 500 mg by mouth every 6 (six) hours as needed for pain.         . DULoxetine (CYMBALTA) 60 MG capsule   Oral   Take 60 mg by mouth daily.        . furosemide (LASIX) 40 MG tablet   Oral   Take 40 mg by mouth daily.         Marland Kitchen gabapentin (NEURONTIN) 300 MG capsule   Oral   Take 300 mg by mouth daily.         Marland Kitchen ibuprofen (ADVIL,MOTRIN) 200 MG tablet   Oral   Take 400 mg by mouth every 6 (six) hours as needed for pain.         . metoprolol succinate (TOPROL-XL) 100 MG 24 hr tablet   Oral   Take 100 mg by mouth daily. Take with or immediately following a meal.         . naproxen sodium (ANAPROX) 220 MG tablet   Oral   Take 440 mg by mouth as needed (pain).         . topiramate (TOPAMAX) 100 MG tablet   Oral   Take 100 mg by mouth 2 (two) times daily.           BP 128/76  Pulse 91  Temp(Src) 98.6 F (37 C) (Oral)  Resp 23  SpO2 99%  Physical Exam  Nursing note and vitals reviewed. Constitutional: She appears well-developed and well-nourished. No distress.  HENT:  Head: Normocephalic and atraumatic.  Eyes: Conjunctivae are normal.  Neck: Normal range of motion. Neck supple.  Cardiovascular: Normal rate and regular rhythm.   Pulmonary/Chest: Effort normal and breath sounds normal. She exhibits no tenderness.  Abdominal: Soft. There is no tenderness (Suprapubic tenderness, and left lower quadrant tenderness on palpation without guarding or rebound tenderness. No hernia noted. No overlying skin changes noted.). There is no rebound and no guarding.  Genitourinary: Vagina normal and uterus normal. There is no rash or lesion on the right labia. There is no rash or lesion on the left labia. Cervix exhibits motion tenderness. Cervix exhibits no discharge and no friability. Right adnexum displays tenderness. Right adnexum displays no mass. Left adnexum displays tenderness. Left adnexum displays no mass. No erythema, tenderness or bleeding around the vagina. No vaginal discharge found.  Chaperone present  No CVA tenderness.  Lymphadenopathy:       Right: No inguinal adenopathy present.       Left: No inguinal  adenopathy present.    ED Course  Procedures (including critical care time)  6:50 PM Patient presents with pelvic pain for the past 2 weeks. She does have cervical motion tenderness on exam. Examination was difficult due to large body habitus. Patient is afebrile with stable normal vital signs. I do not suspect tubo-ovarian abscess, or ovarian torsion at this time.  7:54 PM Although patient complained of bloody urine and blood in her stool,UA shows no evidence of hematuria no evidence of UTI. Hemoccult is negative. Patient has normal white count with normal hemoglobin. A wet preps results are unremarkable. She has normal electrolytes and a pregnancy test is negative. She is afebrile with stable normal vital sign. At this time patient has no concerning finding warrant for further evaluation. Patient is stable for discharge.  Labs Reviewed  WET PREP, GENITAL - Abnormal; Notable for the following:    WBC, Wet Prep HPF POC FEW (*)    All other components within normal limits  POCT I-STAT, CHEM 8 - Abnormal; Notable for the following:    Calcium, Ion 1.25 (*)    All other components within normal limits  GC/CHLAMYDIA PROBE AMP  CBC WITH DIFFERENTIAL  URINALYSIS, ROUTINE W REFLEX MICROSCOPIC  PREGNANCY, URINE   No results found.   1. Pelvic pain       MDM  BP 128/76  Pulse 91  Temp(Src) 98.6 F (37 C) (Oral)  Resp 23  SpO2 99%  I have reviewed nursing notes and vital signs.  I reviewed available ER/hospitalization records thought the EMR         Fayrene Helper, [place] 12/04/12 1958

## 2012-12-05 ENCOUNTER — Inpatient Hospital Stay (HOSPITAL_COMMUNITY)
Admission: AD | Admit: 2012-12-05 | Discharge: 2012-12-05 | Disposition: A | Discharge status: Home / Self Care | Payer: Medicaid Other | Source: Ambulatory Visit | Attending: Obstetrics and Gynecology | Admitting: Obstetrics and Gynecology

## 2012-12-05 ENCOUNTER — Inpatient Hospital Stay (HOSPITAL_COMMUNITY): Payer: Medicaid Other

## 2012-12-05 ENCOUNTER — Encounter (HOSPITAL_COMMUNITY): Payer: Self-pay | Admitting: *Deleted

## 2012-12-05 DIAGNOSIS — R197 Diarrhea, unspecified: Secondary | ICD-10-CM | POA: Insufficient documentation

## 2012-12-05 DIAGNOSIS — R102 Pelvic and perineal pain: Secondary | ICD-10-CM

## 2012-12-05 DIAGNOSIS — N949 Unspecified condition associated with female genital organs and menstrual cycle: Secondary | ICD-10-CM | POA: Insufficient documentation

## 2012-12-05 DIAGNOSIS — R109 Unspecified abdominal pain: Secondary | ICD-10-CM | POA: Insufficient documentation

## 2012-12-05 DIAGNOSIS — R112 Nausea with vomiting, unspecified: Secondary | ICD-10-CM | POA: Insufficient documentation

## 2012-12-05 LAB — URINALYSIS, COMPLETE
Ketone: NEGATIVE
Protein: NEGATIVE
RBC,UR: NONE SEEN /HPF (ref 0–5)
Specific Gravity: 1.012 (ref 1.003–1.030)
WBC UR: 1 /HPF (ref 0–5)

## 2012-12-05 MED ORDER — OXYCODONE-ACETAMINOPHEN 5-325 MG PO TABS
1.0000 | ORAL_TABLET | Freq: Once | ORAL | Status: AC
  Administered 2012-12-05: 1 via ORAL
  Filled 2012-12-05: qty 1

## 2012-12-05 MED ORDER — PROMETHAZINE HCL 25 MG PO TABS
25.0000 mg | ORAL_TABLET | Freq: Once | ORAL | Status: AC
  Administered 2012-12-05: 25 mg via ORAL
  Filled 2012-12-05: qty 1

## 2012-12-05 NOTE — ED Provider Notes (Signed)
Medical screening examination/treatment/procedure(s) were conducted as a shared visit with non-physician practitioner(s) and myself.  I personally evaluated the patient during the encounter.  Pelvic pain with normal VS and normal exam.   Labs unremarkable  Donnetta Hutching, MD 12/05/12 1221

## 2012-12-05 NOTE — MAU Note (Signed)
Pt reports abdominal pain and diarrhea.

## 2012-12-05 NOTE — MAU Note (Signed)
t reports she has had increased pelvic pain x 2 weeks and stomach and darrhea x 3 days. Also reports having n/v x 2-3 days (stated she is throwing up blood). Was seen at [facility] yesterday and the sent her home not knowing what was wrong.   

## 2012-12-05 NOTE — MAU Provider Note (Signed)
History     CSN: [id number]  Arrival date and time: 12/05/12 5366   First Provider Initiated Contact with Patient 12/05/12 1857      Chief Complaint  Patient presents with  . Pelvic Pain   HPI This is a 47 y.o. female who presents with c/o pelvic pain for 2 weeks. Had her period a week ago. Started having nausea and vomiting several days ago followed by Diarrhea.  Seen at [facility] yesterday and states they did nothing for her.  Also states her periods have been irregular and she has milk leaking from breasts.  States her family doctor doesn't know what is wrong with her.   RN Note: reports she has had increased pelvic pain x 2 weeks and stomach and darrhea x 3 days. Also reports having n/v x 2-3 days (stated she is throwing up blood). Was seen at [facility] yesterday and the sent her home not knowing what was wrong.       OB History   Grav Para Term Preterm Abortions TAB SAB Ect Mult Living                  Past Medical History  Diagnosis Date  . Hypertension   . High cholesterol   . Anxiety   . Depression     Past Surgical History  Procedure Laterality Date  . Tubal ligation  1998    Family History  Problem Relation Age of Onset  . Hypertension Father   . Diabetes Father   . Hyperlipidemia Neg Hx   . Heart disease Neg Hx   . Stroke Neg Hx   . Cancer Neg Hx     History  Substance Use Topics  . Smoking status: Never Smoker   . Smokeless tobacco: Never Used  . Alcohol Use: No    Allergies:  Allergies  Allergen Reactions  . Toradol (Ketorolac Tromethamine) Itching  . Tramadol Itching  . Zofran (Ondansetron Hcl) Itching    Prescriptions prior to admission  Medication Sig Dispense Refill  . acetaminophen (TYLENOL) 500 MG tablet Take 500 mg by mouth every 6 (six) hours as needed for pain.      . DULoxetine (CYMBALTA) 60 MG capsule Take 60 mg by mouth daily.      . furosemide (LASIX) 40 MG tablet Take 40 mg by mouth daily.      Marland Kitchen gabapentin (NEURONTIN) 300 MG  capsule Take 300 mg by mouth daily.      Marland Kitchen ibuprofen (ADVIL,MOTRIN) 200 MG tablet Take 400 mg by mouth every 6 (six) hours as needed for pain.      . metoprolol succinate (TOPROL-XL) 100 MG 24 hr tablet Take 100 mg by mouth daily. Take with or immediately following a meal.      . naproxen sodium (ANAPROX) 220 MG tablet Take 440 mg by mouth as needed (pain).      . topiramate (TOPAMAX) 100 MG tablet Take 100 mg by mouth 2 (two) times daily.        Review of Systems  Constitutional: Negative for fever, chills and malaise/fatigue.  Gastrointestinal: Positive for abdominal pain, diarrhea and blood in stool. Negative for nausea, vomiting and constipation.  Genitourinary: Negative for dysuria.  Neurological: Negative for focal weakness, weakness and headaches.   Physical Exam   Blood pressure 133/89, pulse 117, temperature 98.2 F (36.8 C), temperature source Oral, resp. rate 18, height 4\' 11"  (1.499 m), weight 221 lb (100.245 kg), last menstrual period 11/28/2012.  Physical Exam  Constitutional:  She is oriented to person, place, and time. She appears well-developed and well-nourished. No distress.  HENT:  Head: Normocephalic.  Cardiovascular: Normal rate.   Respiratory: Effort normal.  GI: Soft. She exhibits no distension and no mass. There is tenderness. There is no rebound and no guarding.  Genitourinary: Vagina normal. Uterus is tender (Exam limited by habitus). Cervix exhibits motion tenderness. Right adnexum displays tenderness. Left adnexum displays tenderness. No vaginal discharge found.  Musculoskeletal: Normal range of motion.  Neurological: She is alert and oriented to person, place, and time.  Skin: Skin is warm and dry.  Psychiatric: She has a normal mood and affect.    MAU Course  Procedures  MDM Pelvic US ordered.   Discussed with Dr Emelda Fear. Will medicate with Percocet for now.   *RADIOLOGY REPORT*  Clinical Data: Pelvic pain.  TRANSABDOMINAL AND TRANSVAGINAL  ULTRASOUND OF PELVIS  Technique: Both transabdominal and transvaginal ultrasound  examinations of the pelvis were performed. Transabdominal technique  was performed for global imaging of the pelvis including uterus,  ovaries, adnexal regions, and pelvic cul-de-sac.  It was necessary to proceed with endovaginal exam following the  transabdominal exam to visualize the uterus and endometrium.  Comparison: CT abdomen and pelvis 01/22/2011 appear  Findings:  Uterus: Normal in size and appearance  Endometrium: Normal in thickness and appearance  Right ovary: Normal appearance/no adnexal mass  Left ovary: Normal appearance/no adnexal mass  Other findings: No free fluid  IMPRESSION:  Normal study. No evidence of pelvic mass or other significant  abnormality.  Original Report Authenticated By: Holley Dexter, M.D.   Assessment and Plan  Report to Zorita Pang CNM  [facility] 12/05/2012, 7:18 PM   1. Female pelvic pain    Nl ultrasound findings Discussed multiple etiologies of pelvic pain Offered appointment in GYN clinic Pt declines, plans to FU with PCP

## 2012-12-06 ENCOUNTER — Emergency Department: Payer: Self-pay | Admitting: Emergency Medicine

## 2012-12-06 LAB — URINALYSIS, COMPLETE
Bilirubin,UR: NEGATIVE
Glucose,UR: NEGATIVE mg/dL (ref 0–75)
Ketone: NEGATIVE
Protein: NEGATIVE
Specific Gravity: 1.018 (ref 1.003–1.030)
Squamous Epithelial: 43
WBC UR: 1 /HPF (ref 0–5)

## 2012-12-06 LAB — GC/CHLAMYDIA PROBE AMP
CT Probe RNA: NEGATIVE
GC Probe RNA: NEGATIVE

## 2012-12-06 LAB — COMPREHENSIVE METABOLIC PANEL
Albumin: 3.2 g/dL — ABNORMAL LOW (ref 3.4–5.0)
Anion Gap: 8 (ref 7–16)
Bilirubin,Total: 0.2 mg/dL (ref 0.2–1.0)
Calcium, Total: 8.3 mg/dL — ABNORMAL LOW (ref 8.5–10.1)
Creatinine: 0.73 mg/dL (ref 0.60–1.30)
EGFR (Non-African Amer.): >60
Potassium: 3.5 mmol/L (ref 3.5–5.1)
SGOT(AST): 62 U/L — ABNORMAL HIGH (ref 15–37)
SGPT (ALT): 52 U/L (ref 12–78)
Sodium: 141 mmol/L (ref 136–145)
Total Protein: 6.7 g/dL (ref 6.4–8.2)

## 2012-12-06 LAB — CBC
HGB: 13 g/dL (ref 12.0–16.0)
MCH: 31 pg (ref 26.0–34.0)
MCV: 92 fL (ref 80–100)
Platelet: 291 10*3/uL (ref 150–440)
RBC: 4.19 10*6/uL (ref 3.80–5.20)
WBC: 6.9 10*3/uL (ref 3.6–11.0)

## 2012-12-07 NOTE — MAU Provider Note (Signed)
Attestation of Attending Supervision of Advanced Practitioner: Evaluation and management procedures were performed by the PA/NP/CNM/OB Fellow under my supervision/collaboration. Chart reviewed and agree with management and plan.  Aliveah Gallant V 12/07/2012 12:07 PM

## 2012-12-26 ENCOUNTER — Encounter (HOSPITAL_COMMUNITY): Payer: Self-pay | Admitting: Nurse Practitioner

## 2012-12-26 ENCOUNTER — Emergency Department (HOSPITAL_COMMUNITY): Payer: Medicaid Other

## 2012-12-26 ENCOUNTER — Emergency Department: Payer: Self-pay | Admitting: Emergency Medicine

## 2012-12-26 ENCOUNTER — Emergency Department (HOSPITAL_COMMUNITY)
Admission: EM | Admit: 2012-12-26 | Discharge: 2012-12-26 | Disposition: A | Discharge status: Home / Self Care | Payer: Medicaid Other | Attending: Emergency Medicine | Admitting: Emergency Medicine

## 2012-12-26 DIAGNOSIS — R197 Diarrhea, unspecified: Secondary | ICD-10-CM | POA: Insufficient documentation

## 2012-12-26 DIAGNOSIS — F411 Generalized anxiety disorder: Secondary | ICD-10-CM | POA: Insufficient documentation

## 2012-12-26 DIAGNOSIS — F329 Major depressive disorder, single episode, unspecified: Secondary | ICD-10-CM | POA: Insufficient documentation

## 2012-12-26 DIAGNOSIS — Z8639 Personal history of other endocrine, nutritional and metabolic disease: Secondary | ICD-10-CM | POA: Insufficient documentation

## 2012-12-26 DIAGNOSIS — Z79899 Other long term (current) drug therapy: Secondary | ICD-10-CM | POA: Insufficient documentation

## 2012-12-26 DIAGNOSIS — Z862 Personal history of diseases of the blood and blood-forming organs and certain disorders involving the immune mechanism: Secondary | ICD-10-CM | POA: Insufficient documentation

## 2012-12-26 DIAGNOSIS — R109 Unspecified abdominal pain: Secondary | ICD-10-CM

## 2012-12-26 DIAGNOSIS — I10 Essential (primary) hypertension: Secondary | ICD-10-CM | POA: Insufficient documentation

## 2012-12-26 DIAGNOSIS — Z3202 Encounter for pregnancy test, result negative: Secondary | ICD-10-CM | POA: Insufficient documentation

## 2012-12-26 DIAGNOSIS — R110 Nausea: Secondary | ICD-10-CM | POA: Insufficient documentation

## 2012-12-26 DIAGNOSIS — F3289 Other specified depressive episodes: Secondary | ICD-10-CM | POA: Insufficient documentation

## 2012-12-26 DIAGNOSIS — R1031 Right lower quadrant pain: Secondary | ICD-10-CM | POA: Insufficient documentation

## 2012-12-26 LAB — URINALYSIS, MICROSCOPIC ONLY
Glucose, UA: NEGATIVE mg/dL
Leukocytes, UA: NEGATIVE
Nitrite: NEGATIVE
Protein, ur: NEGATIVE mg/dL
Urobilinogen, UA: 0.2 mg/dL (ref 0.0–1.0)

## 2012-12-26 LAB — COMPREHENSIVE METABOLIC PANEL
AST: 22 U/L (ref 0–37)
Albumin: 3.7 g/dL (ref 3.4–5.0)
Albumin: 3.8 g/dL (ref 3.5–5.2)
Alkaline Phosphatase: 85 U/L (ref 39–117)
Anion Gap: 5 — ABNORMAL LOW (ref 7–16)
BUN: 19 mg/dL (ref 6–23)
CO2: 24 mEq/L (ref 19–32)
Calcium, Total: 8.9 mg/dL (ref 8.5–10.1)
Calcium: 9.3 mg/dL (ref 8.4–10.5)
Chloride: 109 mmol/L — ABNORMAL HIGH (ref 98–107)
Chloride: 106 mEq/L (ref 96–112)
Co2: 28 mmol/L (ref 21–32)
Creatinine, Ser: 0.62 mg/dL (ref 0.50–1.10)
Creatinine: 0.68 mg/dL (ref 0.60–1.30)
EGFR (Non-African Amer.): >60
GFR calc non Af Amer: >90 mL/min (ref >90)
Glucose, Bld: 94 mg/dL (ref 70–99)
Glucose: 107 mg/dL — ABNORMAL HIGH (ref 65–99)
Osmolality: 286 (ref 275–301)
Potassium: 3.7 mmol/L (ref 3.5–5.1)
SGOT(AST): 25 U/L (ref 15–37)
SGPT (ALT): 30 U/L (ref 12–78)
Sodium: 142 mmol/L (ref 136–145)
Total Protein: 7.6 g/dL (ref 6.4–8.2)

## 2012-12-26 LAB — URINALYSIS, COMPLETE
Ketone: NEGATIVE
Leukocyte Esterase: NEGATIVE
Ph: 6 (ref 4.5–8.0)
Protein: NEGATIVE
RBC,UR: 2 /HPF (ref 0–5)
Specific Gravity: 1.024 (ref 1.003–1.030)

## 2012-12-26 LAB — CBC
HCT: 42.8 % (ref 35.0–47.0)
HGB: 14.2 g/dL (ref 12.0–16.0)
MCH: 29.9 pg (ref 26.0–34.0)
MCHC: 33.3 g/dL (ref 32.0–36.0)
Platelet: 410 10*3/uL (ref 150–440)
RDW: 12.7 % (ref 11.5–14.5)

## 2012-12-26 LAB — CBC WITH DIFFERENTIAL/PLATELET
Basophils Absolute: 0 10*3/uL (ref 0.0–0.1)
Basophils Relative: 0 % (ref 0–1)
Eosinophils Relative: 3 % (ref 0–5)
HCT: 41.6 % (ref 36.0–46.0)
Lymphs Abs: 3.6 10*3/uL (ref 0.7–4.0)
MCH: 30.4 pg (ref 26.0–34.0)
MCHC: 34.4 g/dL (ref 30.0–36.0)
MCV: 88.5 fL (ref 78.0–100.0)
Monocytes Absolute: 0.5 10*3/uL (ref 0.1–1.0)
Neutro Abs: 6.9 10*3/uL (ref 1.7–7.7)
RDW: 12.6 % (ref 11.5–15.5)

## 2012-12-26 LAB — LIPASE, BLOOD
Lipase: 178 U/L (ref 73–393)
Lipase: 43 U/L (ref 11–59)

## 2012-12-26 LAB — WET PREP, GENITAL
Clue Cells Wet Prep HPF POC: NONE SEEN
WBC, Wet Prep HPF POC: NONE SEEN

## 2012-12-26 MED ORDER — HYDROMORPHONE HCL PF 1 MG/ML IJ SOLN
1.0000 mg | Freq: Once | INTRAMUSCULAR | Status: AC
  Administered 2012-12-26: 1 mg via INTRAVENOUS
  Filled 2012-12-26: qty 1

## 2012-12-26 MED ORDER — PROMETHAZINE HCL 25 MG PO TABS: 25.0000 mg | ORAL_TABLET | Freq: Four times a day (QID) | ORAL | Status: DC | PRN

## 2012-12-26 MED ORDER — ONDANSETRON HCL 4 MG/2ML IJ SOLN
4.0000 mg | Freq: Once | INTRAMUSCULAR | Status: AC
  Administered 2012-12-26: 4 mg via INTRAVENOUS
  Filled 2012-12-26: qty 2

## 2012-12-26 MED ORDER — OXYCODONE-ACETAMINOPHEN 5-325 MG PO TABS
1.0000 | ORAL_TABLET | Freq: Once | ORAL | Status: AC
  Administered 2012-12-26: 1 via ORAL
  Filled 2012-12-26: qty 1

## 2012-12-26 MED ORDER — IOHEXOL 300 MG/ML  SOLN: 50.0000 mL | Freq: Once | INTRAMUSCULAR | Status: DC | PRN

## 2012-12-26 MED ORDER — MORPHINE SULFATE 4 MG/ML IJ SOLN
4.0000 mg | Freq: Once | INTRAMUSCULAR | Status: AC
  Administered 2012-12-26: 4 mg via INTRAVENOUS
  Filled 2012-12-26: qty 1

## 2012-12-26 MED ORDER — IOHEXOL 300 MG/ML  SOLN
100.0000 mL | Freq: Once | INTRAMUSCULAR | Status: AC | PRN
  Administered 2012-12-26: 100 mL via INTRAVENOUS

## 2012-12-26 MED ORDER — PROMETHAZINE HCL 25 MG/ML IJ SOLN
12.5000 mg | Freq: Four times a day (QID) | INTRAMUSCULAR | Status: DC | PRN
  Administered 2012-12-26: 12.5 mg via INTRAVENOUS
  Filled 2012-12-26: qty 1

## 2012-12-26 NOTE — ED Provider Notes (Signed)
Pt received from [place], PA-C.  CT abd/pelvis negative for acute pathology.  Results discussed w/ pt.  She is tolerating pos but continues to have pain.  I offered one percocet.  She will be d/c'd home w/ phenergan.  Advised PCP f/u this week. Return precautions discussed. 10:48 PM   Otilio Miu, PA-C 12/26/12 2248

## 2012-12-26 NOTE — ED Notes (Signed)
Pt reports lower back and abd pain for 3 days. Describes as "throbbing pain." took ibuprofen and tylenol with no relief. Reports nausea and diarrhea.

## 2012-12-26 NOTE — ED Provider Notes (Signed)
History     CSN: [id number]  Arrival date & time 12/26/12  1733   First MD Initiated Contact with Patient 12/26/12 1805      Chief Complaint  Patient presents with  . Abdominal Pain    (Consider location/radiation/quality/duration/timing/severity/associated sxs/prior treatment) HPI Comments: Pt states that this pain is different [facility]  Patient is a 47 y.o. female presenting with abdominal pain. The history is provided by the patient. No language interpreter was used.  Abdominal Pain Pain location:  RLQ Pain quality: aching   Pain radiation: lower back. Pain severity:  Moderate Onset quality:  Gradual Duration:  3 days Timing:  Constant Progression:  Worsening Chronicity:  New Relieved by:  Nothing Worsened by:  Nothing tried Ineffective treatments:  None tried Associated symptoms: diarrhea and nausea   Associated symptoms: no chest pain and no vaginal discharge     Past Medical History  Diagnosis Date  . Hypertension   . High cholesterol   . Anxiety   . Depression     Past Surgical History  Procedure Laterality Date  . Tubal ligation  1998    Family History  Problem Relation Age of Onset  . Hypertension Father   . Diabetes Father   . Hyperlipidemia Neg Hx   . Heart disease Neg Hx   . Stroke Neg Hx   . Cancer Neg Hx     History  Substance Use Topics  . Smoking status: Never Smoker   . Smokeless tobacco: Never Used  . Alcohol Use: No    OB History   Grav Para Term Preterm Abortions TAB SAB Ect Mult Living                  Review of Systems  Constitutional: Negative.   Respiratory: Negative.   Cardiovascular: Negative for chest pain.  Gastrointestinal: Positive for nausea, abdominal pain and diarrhea.  Genitourinary: Negative for vaginal discharge.    Allergies  Toradol; Tramadol; and Zofran  Home Medications   Current Outpatient Rx  Name  Route  Sig  Dispense  Refill  . acetaminophen (TYLENOL) 500 MG  tablet   Oral   Take 500 mg by mouth every 6 (six) hours as needed for pain.         . DULoxetine (CYMBALTA) 60 MG capsule   Oral   Take 60 mg by mouth daily.         . furosemide (LASIX) 40 MG tablet   Oral   Take 40 mg by mouth daily.         Marland Kitchen gabapentin (NEURONTIN) 300 MG capsule   Oral   Take 300 mg by mouth daily.         . metoprolol succinate (TOPROL-XL) 100 MG 24 hr tablet   Oral   Take 100 mg by mouth daily. Take with or immediately following a meal.         . topiramate (TOPAMAX) 100 MG tablet   Oral   Take 100 mg by mouth 2 (two) times daily.           BP 146/68  Pulse 94  Temp(Src) 98.1 F (36.7 C) (Oral)  Resp 15  SpO2 98%  LMP 11/28/2012  Physical Exam  Nursing note and vitals reviewed. Constitutional: She is oriented to person, place, and time. She appears well-developed and well-nourished.  HENT:  Head: Normocephalic and atraumatic.  Eyes: Conjunctivae and EOM are normal.  Neck: Normal range of motion. Neck  supple.  Cardiovascular: Normal rate and regular rhythm.   Pulmonary/Chest: Effort normal and breath sounds normal.  Abdominal: Soft. Bowel sounds are normal. There is tenderness in the right lower quadrant.  Genitourinary:  Thin white discharge:no cmt  Musculoskeletal: Normal range of motion.  Neurological: She is oriented to person, place, and time.  Skin: Skin is warm and dry.  Psychiatric: She has a normal mood and affect.    ED Course  Procedures (including critical care time)  Labs Reviewed  CBC WITH DIFFERENTIAL - Abnormal; Notable for the following:    WBC 11.3 (*)    Platelets 404 (*)    All other components within normal limits  URINALYSIS, MICROSCOPIC ONLY - Abnormal; Notable for the following:    APPearance CLOUDY (*)    Bacteria, UA FEW (*)    Squamous Epithelial / LPF MANY (*)    All other components within normal limits  WET PREP, GENITAL  GC/CHLAMYDIA PROBE AMP  COMPREHENSIVE METABOLIC PANEL  LIPASE,  BLOOD  POCT PREGNANCY, URINE   No results found.   No diagnosis found.    MDM  Pt was [facility] for [facility]:us negative on the visit:pt states that this is a little different:will r/o appendicitis        Teressa Lower, NP 12/26/12 2033

## 2012-12-26 NOTE — ED Notes (Signed)
Pt states she is nauseous.  Pt informed medication had been ordered for her nausea by NP and that it would be coming from pharmacy.  Pt also informed RN they had finished their contrast.  CT notified.

## 2012-12-26 NOTE — ED Notes (Signed)
Pelvic cart at bedside. 

## 2012-12-26 NOTE — ED Notes (Signed)
Pt given CT contrast by RN. Pt encouraged to drink contrast.  Pt verbalized understanding of drinking contrast.

## 2012-12-26 NOTE — ED Notes (Signed)
Attempted to obtain e-signature.  Signature pad not working.  Pt verbalized understanding of d/c and f/u.

## 2012-12-26 NOTE — ED Notes (Signed)
Patient transported to CT 

## 2012-12-27 LAB — GC/CHLAMYDIA PROBE AMP: CT Probe RNA: NEGATIVE

## 2012-12-28 ENCOUNTER — Ambulatory Visit: Payer: Self-pay | Admitting: Cardiovascular Disease

## 2012-12-28 LAB — URINE CULTURE

## 2012-12-28 LAB — CBC
HCT: 38.4 % (ref 35.0–47.0)
MCHC: 35 g/dL (ref 32.0–36.0)
MCV: 89 fL (ref 80–100)
Platelet: 352 10*3/uL (ref 150–440)
RBC: 4.31 10*6/uL (ref 3.80–5.20)
RDW: 13 % (ref 11.5–14.5)

## 2012-12-28 LAB — PREGNANCY, URINE: Pregnancy Test, Urine: NEGATIVE m[IU]/mL

## 2012-12-29 ENCOUNTER — Encounter (HOSPITAL_COMMUNITY): Payer: Self-pay | Admitting: *Deleted

## 2012-12-29 ENCOUNTER — Emergency Department (HOSPITAL_COMMUNITY)
Admission: EM | Admit: 2012-12-29 | Discharge: 2012-12-29 | Disposition: A | Discharge status: Home / Self Care | Payer: Medicaid Other | Attending: Emergency Medicine | Admitting: Emergency Medicine

## 2012-12-29 DIAGNOSIS — F329 Major depressive disorder, single episode, unspecified: Secondary | ICD-10-CM | POA: Insufficient documentation

## 2012-12-29 DIAGNOSIS — Z8639 Personal history of other endocrine, nutritional and metabolic disease: Secondary | ICD-10-CM | POA: Insufficient documentation

## 2012-12-29 DIAGNOSIS — Z79899 Other long term (current) drug therapy: Secondary | ICD-10-CM | POA: Insufficient documentation

## 2012-12-29 DIAGNOSIS — I10 Essential (primary) hypertension: Secondary | ICD-10-CM | POA: Insufficient documentation

## 2012-12-29 DIAGNOSIS — F3289 Other specified depressive episodes: Secondary | ICD-10-CM | POA: Insufficient documentation

## 2012-12-29 DIAGNOSIS — M549 Dorsalgia, unspecified: Secondary | ICD-10-CM | POA: Insufficient documentation

## 2012-12-29 DIAGNOSIS — F411 Generalized anxiety disorder: Secondary | ICD-10-CM | POA: Insufficient documentation

## 2012-12-29 DIAGNOSIS — R112 Nausea with vomiting, unspecified: Secondary | ICD-10-CM | POA: Insufficient documentation

## 2012-12-29 DIAGNOSIS — Z862 Personal history of diseases of the blood and blood-forming organs and certain disorders involving the immune mechanism: Secondary | ICD-10-CM | POA: Insufficient documentation

## 2012-12-29 LAB — COMPREHENSIVE METABOLIC PANEL
AST: 18 U/L (ref 0–37)
Albumin: 3.3 g/dL — ABNORMAL LOW (ref 3.5–5.2)
BUN: 15 mg/dL (ref 6–23)
Calcium: 9.1 mg/dL (ref 8.4–10.5)
Chloride: 104 mEq/L (ref 96–112)
Creatinine, Ser: 0.67 mg/dL (ref 0.50–1.10)
GFR calc non Af Amer: >90 mL/min (ref >90)
Sodium: 138 mEq/L (ref 135–145)

## 2012-12-29 LAB — CBC WITH DIFFERENTIAL/PLATELET
Basophils Absolute: 0 10*3/uL (ref 0.0–0.1)
Basophils Relative: 0 % (ref 0–1)
Eosinophils Absolute: 0.3 10*3/uL (ref 0.0–0.7)
Eosinophils Relative: 4 % (ref 0–5)
HCT: 37.8 % (ref 36.0–46.0)
MCH: 30.9 pg (ref 26.0–34.0)
MCHC: 35.2 g/dL (ref 30.0–36.0)
MCV: 87.7 fL (ref 78.0–100.0)
Monocytes Absolute: 0.5 10*3/uL (ref 0.1–1.0)
Monocytes Relative: 6 % (ref 3–12)
Neutro Abs: 4.5 10*3/uL (ref 1.7–7.7)
Platelets: 351 10*3/uL (ref 150–400)
RDW: 12.8 % (ref 11.5–15.5)
WBC: 8 10*3/uL (ref 4.0–10.5)

## 2012-12-29 LAB — URINALYSIS, ROUTINE W REFLEX MICROSCOPIC
Bilirubin Urine: NEGATIVE
Glucose, UA: NEGATIVE mg/dL
Hgb urine dipstick: NEGATIVE
Ketones, ur: NEGATIVE mg/dL
Leukocytes, UA: NEGATIVE
Specific Gravity, Urine: 1.024 (ref 1.005–1.030)
Urobilinogen, UA: 0.2 mg/dL (ref 0.0–1.0)

## 2012-12-29 MED ORDER — HYDROMORPHONE HCL PF 1 MG/ML IJ SOLN
1.0000 mg | Freq: Once | INTRAMUSCULAR | Status: AC
  Administered 2012-12-29: 1 mg via INTRAVENOUS
  Filled 2012-12-29: qty 1

## 2012-12-29 MED ORDER — HYDROCODONE-ACETAMINOPHEN 5-325 MG PO TABS: 2.0000 | ORAL_TABLET | ORAL | Status: DC | PRN

## 2012-12-29 NOTE — ED Notes (Signed)
C/o right flank pain radiating around to RLQ, n/v x 5 days.

## 2012-12-29 NOTE — ED Notes (Signed)
Seen here for same Sunday, c/o symptoms same, continues to take antibiotic prescribed.  Avg diarrhea 3x daily, emesis x 2 daily. Denies UTI symptoms

## 2012-12-29 NOTE — ED Provider Notes (Signed)
Medical screening examination/treatment/procedure(s) were performed by non-physician practitioner and as supervising physician I was immediately available for consultation/collaboration.  Belmira Daley R. Dannya Pitkin, MD 12/29/12 0804 

## 2012-12-29 NOTE — ED Provider Notes (Signed)
Medical screening examination/treatment/procedure(s) were performed by non-physician practitioner and as supervising physician I was immediately available for consultation/collaboration.  Enijah Furr R. Jhania Etherington, MD 12/29/12 0804 

## 2012-12-29 NOTE — ED Provider Notes (Signed)
History     CSN: [id number]  Arrival date & time 12/29/12  1622   First MD Initiated Contact with Patient 12/29/12 1623      Chief Complaint  Patient presents with  . Flank Pain    (Consider location/radiation/quality/duration/timing/severity/associated sxs/prior treatment) HPI Comments: 47 year old female with a history of a recent evaluation for right-sided abdominal and flank pain. This was 3 days ago. She also notes that she has had nausea occasional vomiting and occasional diarrhea for the last 5 days. She also has normal bowel movements over the last 5 days. The location of her pain is her right lower back just above her bottom, has been constant, sharp and dull and achy as described by the patient, nothing seems to make it better or worse, it is there 24 hours a day she states that when she sleeps she does not feel it, she wakes up and it is there again. She has no associated numbness or weakness of the lower extremities, no difficulty urinating, no dysuria, diarrhea as described above. No fevers or chills, denies IV drug use, denies cancer. She denies any rashes to the skin but states that she feels this area is swollen.    Patient is a 47 y.o. female presenting with flank pain. The history is provided by the patient, a friend, medical records and the EMS personnel.  Flank Pain    Past Medical History  Diagnosis Date  . Hypertension   . High cholesterol   . Anxiety   . Depression     Past Surgical History  Procedure Laterality Date  . Tubal ligation  1998    Family History  Problem Relation Age of Onset  . Hypertension Father   . Diabetes Father   . Hyperlipidemia Neg Hx   . Heart disease Neg Hx   . Stroke Neg Hx   . Cancer Neg Hx     History  Substance Use Topics  . Smoking status: Never Smoker   . Smokeless tobacco: Never Used  . Alcohol Use: No    OB History   Grav Para Term Preterm Abortions TAB SAB Ect Mult Living                  Review of  Systems  Genitourinary: Positive for flank pain.  All other systems reviewed and are negative.    Allergies  Toradol; Tramadol; and Zofran  Home Medications   Current Outpatient Rx  Name  Route  Sig  Dispense  Refill  . acetaminophen (TYLENOL) 500 MG tablet   Oral   Take 500 mg by mouth every 6 (six) hours as needed for pain.         . DULoxetine (CYMBALTA) 60 MG capsule   Oral   Take 60 mg by mouth daily.         . furosemide (LASIX) 40 MG tablet   Oral   Take 40 mg by mouth daily.         Marland Kitchen gabapentin (NEURONTIN) 300 MG capsule   Oral   Take 300 mg by mouth daily.         . metoprolol succinate (TOPROL-XL) 100 MG 24 hr tablet   Oral   Take 100 mg by mouth daily. Take with or immediately following a meal.         . promethazine (PHENERGAN) 25 MG tablet   Oral   Take 1 tablet (25 mg total) by mouth every 6 (six) hours as needed for nausea.  20 tablet   0   . topiramate (TOPAMAX) 100 MG tablet   Oral   Take 100 mg by mouth 2 (two) times daily.         Marland Kitchen HYDROcodone-acetaminophen (NORCO/VICODIN) 5-325 MG per tablet   Oral   Take 2 tablets by mouth every 4 (four) hours as needed for pain.   10 tablet   0     BP 134/93  Pulse 87  Temp(Src) 97.6 F (36.4 C) (Oral)  Resp 18  Ht 5\' 4"  (1.626 m)  Wt 219 lb (99.338 kg)  BMI 37.57 kg/m2  SpO2 100%  LMP 12/19/2012  Physical Exam  Nursing note and vitals reviewed. Constitutional: She appears well-developed and well-nourished. No distress.  HENT:  Head: Normocephalic and atraumatic.  Mouth/Throat: Oropharynx is clear and moist. No oropharyngeal exudate.  Eyes: Conjunctivae and EOM are normal. Pupils are equal, round, and reactive to light. Right eye exhibits no discharge. Left eye exhibits no discharge. No scleral icterus.  Neck: Normal range of motion. Neck supple. No JVD present. No thyromegaly present.  Cardiovascular: Normal rate, regular rhythm, normal heart sounds and intact distal pulses.   Exam reveals no gallop and no friction rub.   No murmur heard. Pulmonary/Chest: Effort normal and breath sounds normal. No respiratory distress. She has no wheezes. She has no rales.  Abdominal: Soft. Bowel sounds are normal. She exhibits no distension and no mass. There is tenderness ( Mild suprapubic tenderness, no other abdominal tenderness, soft and non-peritoneal, no guarding).  Musculoskeletal: Normal range of motion. She exhibits tenderness ( Reproducible tenderness to the right lower back, there is no mass, no rash, no erythema, no induration of the skin. She has no swelling, no asymmetry of the lower back.). She exhibits no edema.  Lymphadenopathy:    She has no cervical adenopathy.  Neurological: She is alert. Coordination normal.  The patient has normal strength and sensation of the bilateral lower extremities, normal speech, normal sensorium, follows commands without difficulty  Skin: Skin is warm and dry. No rash noted. No erythema.  Psychiatric: She has a normal mood and affect. Her behavior is normal.    ED Course  Procedures (including critical care time)  Labs Reviewed  COMPREHENSIVE METABOLIC PANEL - Abnormal; Notable for the following:    Glucose, Bld 111 (*)    Albumin 3.3 (*)    Total Bilirubin 0.2 (*)    All other components within normal limits  URINALYSIS, ROUTINE W REFLEX MICROSCOPIC - Abnormal; Notable for the following:    APPearance HAZY (*)    All other components within normal limits  CBC WITH DIFFERENTIAL   No results found.   1. Back pain       MDM  The patient has right lower back pain without any other obvious symptoms of neurologic deficit, no symptoms of kidney stone, no radiation of the pain. Her exam is benign though there is some reproducible nature of his pain. The CT scan and laboratory workup that was performed 3 days ago has been reviewed by myself, it showed that she had a slight leukocytosis of 11,300, CT scan without any acute findings  and a normal compresses metabolic panel, urinalysis and STD testing.  7:26 PM Without any other objective findings, pt has normal CT a couple of days ago and normal labs today.  She has no focal neuro defecits.  Stable for d/c.    Meds given in ED:  Medications  HYDROmorphone (DILAUDID) injection 1 mg (1 mg  Intravenous Given 12/29/12 1751)    New Prescriptions   HYDROCODONE-ACETAMINOPHEN (NORCO/VICODIN) 5-325 MG PER TABLET    Take 2 tablets by mouth every 4 (four) hours as needed for pain.          Vida Roller, MD 12/29/12 [phone]

## 2013-01-04 ENCOUNTER — Ambulatory Visit: Payer: Self-pay | Admitting: Obstetrics & Gynecology

## 2013-01-05 ENCOUNTER — Emergency Department: Payer: Self-pay | Admitting: Emergency Medicine

## 2013-01-05 LAB — URINALYSIS, COMPLETE
Bilirubin,UR: NEGATIVE
Glucose,UR: NEGATIVE mg/dL (ref 0–75)
Ketone: NEGATIVE
Nitrite: NEGATIVE
Ph: 6 (ref 4.5–8.0)
Protein: 500
Specific Gravity: 1.03 (ref 1.003–1.030)

## 2013-01-05 LAB — COMPREHENSIVE METABOLIC PANEL
Albumin: 3.3 g/dL — ABNORMAL LOW (ref 3.4–5.0)
Anion Gap: 5 — ABNORMAL LOW (ref 7–16)
BUN: 15 mg/dL (ref 7–18)
Bilirubin,Total: 0.3 mg/dL (ref 0.2–1.0)
Calcium, Total: 8.8 mg/dL (ref 8.5–10.1)
Chloride: 104 mmol/L (ref 98–107)
Co2: 28 mmol/L (ref 21–32)
EGFR (African American): >60
EGFR (Non-African Amer.): >60
Glucose: 117 mg/dL — ABNORMAL HIGH (ref 65–99)
Potassium: 4 mmol/L (ref 3.5–5.1)
SGOT(AST): 27 U/L (ref 15–37)
SGPT (ALT): 26 U/L (ref 12–78)
Total Protein: 6.8 g/dL (ref 6.4–8.2)

## 2013-01-05 LAB — TROPONIN I: Troponin-I: <0.02 ng/mL

## 2013-01-05 LAB — PRO B NATRIURETIC PEPTIDE: B-Type Natriuretic Peptide: 370 pg/mL — ABNORMAL HIGH (ref 0–125)

## 2013-01-05 LAB — CBC
MCH: 31 pg (ref 26.0–34.0)
Platelet: 306 10*3/uL (ref 150–440)
RBC: 4.05 10*6/uL (ref 3.80–5.20)
WBC: 12.7 10*3/uL — ABNORMAL HIGH (ref 3.6–11.0)

## 2013-01-06 ENCOUNTER — Emergency Department: Payer: Self-pay | Admitting: Emergency Medicine

## 2013-01-06 LAB — HEMOGLOBIN: HGB: 12.3 g/dL (ref 12.0–16.0)

## 2013-01-07 LAB — PATHOLOGY REPORT

## 2013-01-08 ENCOUNTER — Observation Stay: Payer: Self-pay

## 2013-01-08 LAB — URINALYSIS, COMPLETE
Bacteria: NONE SEEN
Bilirubin,UR: NEGATIVE
Glucose,UR: NEGATIVE mg/dL (ref 0–75)
Ketone: NEGATIVE
Leukocyte Esterase: NEGATIVE
Nitrite: NEGATIVE
Ph: 5 (ref 4.5–8.0)
Protein: NEGATIVE
RBC,UR: 2 /HPF (ref 0–5)
Specific Gravity: 1.03 (ref 1.003–1.030)
Squamous Epithelial: 3
WBC UR: 4 /HPF (ref 0–5)

## 2013-01-08 LAB — CBC
HCT: 33.3 % — ABNORMAL LOW (ref 35.0–47.0)
MCH: 30.6 pg (ref 26.0–34.0)
MCHC: 33.9 g/dL (ref 32.0–36.0)
Platelet: 258 10*3/uL (ref 150–440)
RBC: 3.69 10*6/uL — ABNORMAL LOW (ref 3.80–5.20)
RDW: 12.7 % (ref 11.5–14.5)

## 2013-01-08 LAB — COMPREHENSIVE METABOLIC PANEL
Alkaline Phosphatase: 119 U/L (ref 50–136)
Anion Gap: 4 — ABNORMAL LOW (ref 7–16)
BUN: 11 mg/dL (ref 7–18)
Bilirubin,Total: 0.2 mg/dL (ref 0.2–1.0)
Calcium, Total: 7.9 mg/dL — ABNORMAL LOW (ref 8.5–10.1)
Chloride: 106 mmol/L (ref 98–107)
Co2: 32 mmol/L (ref 21–32)
Creatinine: 0.77 mg/dL (ref 0.60–1.30)
EGFR (Non-African Amer.): >60
Osmolality: 282 (ref 275–301)
Potassium: 3.5 mmol/L (ref 3.5–5.1)
Sodium: 142 mmol/L (ref 136–145)
Total Protein: 6.3 g/dL — ABNORMAL LOW (ref 6.4–8.2)

## 2013-01-09 LAB — COMPREHENSIVE METABOLIC PANEL
Albumin: 2.5 g/dL — ABNORMAL LOW (ref 3.4–5.0)
Anion Gap: 5 — ABNORMAL LOW (ref 7–16)
Calcium, Total: 8 mg/dL — ABNORMAL LOW (ref 8.5–10.1)
EGFR (African American): >60
Glucose: 131 mg/dL — ABNORMAL HIGH (ref 65–99)
Osmolality: 282 (ref 275–301)
Potassium: 4.2 mmol/L (ref 3.5–5.1)
SGPT (ALT): 106 U/L — ABNORMAL HIGH (ref 12–78)
Sodium: 141 mmol/L (ref 136–145)

## 2013-01-09 LAB — CBC
HCT: 30.1 % — ABNORMAL LOW (ref 35.0–47.0)
HGB: 10.1 g/dL — ABNORMAL LOW (ref 12.0–16.0)
MCH: 30.1 pg (ref 26.0–34.0)
MCHC: 33.3 g/dL (ref 32.0–36.0)
MCV: 90 fL (ref 80–100)
Platelet: 222 10*3/uL (ref 150–440)
WBC: 7.7 10*3/uL (ref 3.6–11.0)

## 2013-01-10 ENCOUNTER — Emergency Department: Payer: Self-pay | Admitting: Emergency Medicine

## 2013-01-10 LAB — COMPREHENSIVE METABOLIC PANEL
Albumin: 2.7 g/dL — ABNORMAL LOW (ref 3.4–5.0)
Alkaline Phosphatase: 117 U/L (ref 50–136)
Alkaline Phosphatase: 114 U/L (ref 50–136)
Anion Gap: 4 — ABNORMAL LOW (ref 7–16)
BUN: 10 mg/dL (ref 7–18)
Bilirubin,Total: 0.2 mg/dL (ref 0.2–1.0)
Calcium, Total: 8.2 mg/dL — ABNORMAL LOW (ref 8.5–10.1)
Chloride: 105 mmol/L (ref 98–107)
Chloride: 105 mmol/L (ref 98–107)
Co2: 32 mmol/L (ref 21–32)
Co2: 29 mmol/L (ref 21–32)
Creatinine: 0.86 mg/dL (ref 0.60–1.30)
Creatinine: 0.57 mg/dL — ABNORMAL LOW (ref 0.60–1.30)
EGFR (African American): >60
EGFR (Non-African Amer.): >60
EGFR (Non-African Amer.): >60
Glucose: 110 mg/dL — ABNORMAL HIGH (ref 65–99)
Osmolality: 279 (ref 275–301)
Osmolality: 275 (ref 275–301)
Potassium: 3.9 mmol/L (ref 3.5–5.1)
SGOT(AST): 69 U/L — ABNORMAL HIGH (ref 15–37)
SGOT(AST): 63 U/L — ABNORMAL HIGH (ref 15–37)
SGPT (ALT): 90 U/L — ABNORMAL HIGH (ref 12–78)
Sodium: 140 mmol/L (ref 136–145)
Total Protein: 6.2 g/dL — ABNORMAL LOW (ref 6.4–8.2)

## 2013-01-10 LAB — URINALYSIS, COMPLETE
Bilirubin,UR: NEGATIVE
Glucose,UR: NEGATIVE mg/dL (ref 0–75)
Ketone: NEGATIVE
Nitrite: NEGATIVE
RBC,UR: 44 /HPF (ref 0–5)
Squamous Epithelial: 3
WBC UR: 527 /HPF (ref 0–5)

## 2013-01-10 LAB — CBC
MCH: 30.8 pg (ref 26.0–34.0)
MCHC: 34.1 g/dL (ref 32.0–36.0)
MCV: 90 fL (ref 80–100)
Platelet: 258 10*3/uL (ref 150–440)
RBC: 3.5 10*6/uL — ABNORMAL LOW (ref 3.80–5.20)
WBC: 10 10*3/uL (ref 3.6–11.0)

## 2013-01-10 LAB — LIPASE, BLOOD: Lipase: 79 U/L (ref 73–393)

## 2013-01-14 ENCOUNTER — Emergency Department: Payer: Self-pay | Admitting: Emergency Medicine

## 2013-01-14 LAB — COMPREHENSIVE METABOLIC PANEL
Albumin: 2.8 g/dL — ABNORMAL LOW (ref 3.4–5.0)
Anion Gap: 5 — ABNORMAL LOW (ref 7–16)
BUN: 8 mg/dL (ref 7–18)
Calcium, Total: 8.2 mg/dL — ABNORMAL LOW (ref 8.5–10.1)
Chloride: 106 mmol/L (ref 98–107)
Glucose: 93 mg/dL (ref 65–99)
Total Protein: 6.3 g/dL — ABNORMAL LOW (ref 6.4–8.2)

## 2013-01-14 LAB — CBC
HCT: 32.4 % — ABNORMAL LOW (ref 35.0–47.0)
HGB: 10.9 g/dL — ABNORMAL LOW (ref 12.0–16.0)
Platelet: 308 10*3/uL (ref 150–440)
RBC: 3.62 10*6/uL — ABNORMAL LOW (ref 3.80–5.20)
WBC: 8.6 10*3/uL (ref 3.6–11.0)

## 2013-03-03 ENCOUNTER — Ambulatory Visit: Payer: Self-pay | Admitting: Pain Medicine

## 2013-03-14 ENCOUNTER — Ambulatory Visit: Payer: Self-pay | Admitting: Pain Medicine

## 2013-04-12 ENCOUNTER — Ambulatory Visit: Payer: Self-pay | Admitting: Pain Medicine

## 2013-04-20 ENCOUNTER — Ambulatory Visit: Payer: Self-pay | Admitting: Pain Medicine

## 2013-05-24 ENCOUNTER — Ambulatory Visit: Payer: Self-pay | Admitting: Pain Medicine

## 2013-05-30 ENCOUNTER — Ambulatory Visit: Payer: Self-pay | Admitting: Pain Medicine

## 2013-06-01 ENCOUNTER — Emergency Department: Payer: Self-pay | Admitting: Internal Medicine

## 2013-06-01 LAB — COMPREHENSIVE METABOLIC PANEL
Albumin: 3.8 g/dL (ref 3.4–5.0)
Anion Gap: 8 (ref 7–16)
BUN: 14 mg/dL (ref 7–18)
Calcium, Total: 9.1 mg/dL (ref 8.5–10.1)
Chloride: 104 mmol/L (ref 98–107)
Co2: 24 mmol/L (ref 21–32)
Creatinine: 0.86 mg/dL (ref 0.60–1.30)
EGFR (African American): >60
Glucose: 107 mg/dL — ABNORMAL HIGH (ref 65–99)
Potassium: 3.3 mmol/L — ABNORMAL LOW (ref 3.5–5.1)
SGPT (ALT): 24 U/L (ref 12–78)
Sodium: 136 mmol/L (ref 136–145)
Total Protein: 7.4 g/dL (ref 6.4–8.2)

## 2013-06-01 LAB — URINALYSIS, COMPLETE
Bacteria: NONE SEEN
Bilirubin,UR: NEGATIVE
Blood: NEGATIVE
Glucose,UR: NEGATIVE mg/dL (ref 0–75)
Nitrite: NEGATIVE
Ph: 6 (ref 4.5–8.0)
RBC,UR: <1 /HPF (ref 0–5)
Specific Gravity: 1.002 (ref 1.003–1.030)
Squamous Epithelial: <1
WBC UR: <1 /HPF (ref 0–5)

## 2013-06-01 LAB — TROPONIN I: Troponin-I: <0.02 ng/mL

## 2013-06-01 LAB — CBC
HCT: 40.8 % (ref 35.0–47.0)
HGB: 14 g/dL (ref 12.0–16.0)
MCH: 30 pg (ref 26.0–34.0)
Platelet: 362 10*3/uL (ref 150–440)
RDW: 14.4 % (ref 11.5–14.5)
WBC: 10.2 10*3/uL (ref 3.6–11.0)

## 2013-06-01 LAB — LIPASE, BLOOD: Lipase: 152 U/L (ref 73–393)

## 2013-06-23 ENCOUNTER — Ambulatory Visit: Payer: Self-pay | Admitting: Pain Medicine

## 2013-06-24 ENCOUNTER — Emergency Department: Payer: Self-pay | Admitting: Emergency Medicine

## 2013-06-24 LAB — CBC
HGB: 16 g/dL (ref 12.0–16.0)
MCHC: 34.3 g/dL (ref 32.0–36.0)
RBC: 5.24 10*6/uL — ABNORMAL HIGH (ref 3.80–5.20)
WBC: 8.5 10*3/uL (ref 3.6–11.0)

## 2013-06-24 LAB — COMPREHENSIVE METABOLIC PANEL
Alkaline Phosphatase: 78 U/L (ref 50–136)
Anion Gap: 5 — ABNORMAL LOW (ref 7–16)
BUN: 17 mg/dL (ref 7–18)
Bilirubin,Total: 0.4 mg/dL (ref 0.2–1.0)
Calcium, Total: 8.8 mg/dL (ref 8.5–10.1)
Chloride: 105 mmol/L (ref 98–107)
Co2: 27 mmol/L (ref 21–32)
Creatinine: 0.97 mg/dL (ref 0.60–1.30)
EGFR (African American): >60
EGFR (Non-African Amer.): >60
Osmolality: 275 (ref 275–301)
Potassium: 3.3 mmol/L — ABNORMAL LOW (ref 3.5–5.1)
SGOT(AST): 20 U/L (ref 15–37)
Sodium: 137 mmol/L (ref 136–145)

## 2013-06-24 LAB — URINALYSIS, COMPLETE
Glucose,UR: NEGATIVE mg/dL (ref 0–75)
Ketone: NEGATIVE
Leukocyte Esterase: NEGATIVE
RBC,UR: 4 /HPF (ref 0–5)
Specific Gravity: 1.01 (ref 1.003–1.030)
Squamous Epithelial: 4
WBC UR: 2 /HPF (ref 0–5)

## 2013-06-27 ENCOUNTER — Ambulatory Visit: Payer: Self-pay | Admitting: Pain Medicine

## 2013-10-04 ENCOUNTER — Ambulatory Visit: Payer: Self-pay | Admitting: Pain Medicine

## 2013-10-10 ENCOUNTER — Ambulatory Visit: Payer: Self-pay | Admitting: Pain Medicine

## 2013-10-17 ENCOUNTER — Encounter: Payer: Self-pay | Admitting: General Surgery

## 2013-10-17 ENCOUNTER — Observation Stay: Payer: Self-pay | Admitting: General Surgery

## 2013-10-17 DIAGNOSIS — R109 Unspecified abdominal pain: Secondary | ICD-10-CM

## 2013-10-17 LAB — BASIC METABOLIC PANEL
ANION GAP: 4 — AB (ref 7–16)
BUN: 19 mg/dL — ABNORMAL HIGH (ref 7–18)
CREATININE: 0.93 mg/dL (ref 0.60–1.30)
Calcium, Total: 8.8 mg/dL (ref 8.5–10.1)
Chloride: 102 mmol/L (ref 98–107)
Co2: 32 mmol/L (ref 21–32)
EGFR (African American): >60
Glucose: 95 mg/dL (ref 65–99)
Osmolality: 278 (ref 275–301)
POTASSIUM: 4.3 mmol/L (ref 3.5–5.1)
Sodium: 138 mmol/L (ref 136–145)

## 2013-10-18 LAB — CBC WITH DIFFERENTIAL/PLATELET
Basophil #: 0.1 10*3/uL (ref 0.0–0.1)
Basophil %: 0.7 %
EOS ABS: 0.2 10*3/uL (ref 0.0–0.7)
Eosinophil %: 1.5 %
HCT: 40.9 % (ref 35.0–47.0)
HGB: 13.7 g/dL (ref 12.0–16.0)
LYMPHS ABS: 2.5 10*3/uL (ref 1.0–3.6)
LYMPHS PCT: 19.4 %
MCH: 31.2 pg (ref 26.0–34.0)
MCHC: 33.6 g/dL (ref 32.0–36.0)
MCV: 93 fL (ref 80–100)
MONO ABS: 0.7 x10 3/mm (ref 0.2–0.9)
Monocyte %: 5 %
NEUTROS PCT: 73.4 %
Neutrophil #: 9.6 10*3/uL — ABNORMAL HIGH (ref 1.4–6.5)
PLATELETS: 402 10*3/uL (ref 150–440)
RBC: 4.4 10*6/uL (ref 3.80–5.20)
RDW: 13.1 % (ref 11.5–14.5)
WBC: 13.1 10*3/uL — ABNORMAL HIGH (ref 3.6–11.0)

## 2013-10-19 ENCOUNTER — Encounter: Payer: Self-pay | Admitting: General Surgery

## 2013-10-19 LAB — CBC WITH DIFFERENTIAL/PLATELET
BASOS PCT: 0.3 %
Basophil #: 0 10*3/uL (ref 0.0–0.1)
Eosinophil #: 0.2 10*3/uL (ref 0.0–0.7)
Eosinophil %: 2.5 %
HCT: 36.1 % (ref 35.0–47.0)
HGB: 12.3 g/dL (ref 12.0–16.0)
LYMPHS ABS: 2.7 10*3/uL (ref 1.0–3.6)
Lymphocyte %: 27.1 %
MCH: 31.8 pg (ref 26.0–34.0)
MCHC: 34.1 g/dL (ref 32.0–36.0)
MCV: 93 fL (ref 80–100)
MONOS PCT: 6.6 %
Monocyte #: 0.7 x10 3/mm (ref 0.2–0.9)
NEUTROS ABS: 6.3 10*3/uL (ref 1.4–6.5)
Neutrophil %: 63.5 %
PLATELETS: 322 10*3/uL (ref 150–440)
RBC: 3.87 10*6/uL (ref 3.80–5.20)
RDW: 13.3 % (ref 11.5–14.5)
WBC: 9.9 10*3/uL (ref 3.6–11.0)

## 2013-10-19 LAB — SEDIMENTATION RATE: ERYTHROCYTE SED RATE: 18 mm/h (ref 0–20)

## 2013-11-08 ENCOUNTER — Ambulatory Visit: Payer: Self-pay | Admitting: Pain Medicine

## 2013-11-14 ENCOUNTER — Ambulatory Visit: Payer: Self-pay | Admitting: Pain Medicine

## 2013-12-13 ENCOUNTER — Ambulatory Visit: Payer: Self-pay | Admitting: Pain Medicine

## 2013-12-14 ENCOUNTER — Ambulatory Visit: Payer: Self-pay | Admitting: Pain Medicine

## 2014-01-05 ENCOUNTER — Ambulatory Visit: Payer: Self-pay | Admitting: Pain Medicine

## 2014-01-09 ENCOUNTER — Ambulatory Visit: Payer: Self-pay | Admitting: Pain Medicine

## 2014-01-31 ENCOUNTER — Ambulatory Visit: Payer: Self-pay | Admitting: Pain Medicine

## 2014-02-08 ENCOUNTER — Ambulatory Visit: Payer: Self-pay | Admitting: Pain Medicine

## 2014-02-28 ENCOUNTER — Ambulatory Visit: Payer: Self-pay | Admitting: Pain Medicine

## 2014-03-08 ENCOUNTER — Ambulatory Visit: Payer: Self-pay | Admitting: Pain Medicine

## 2014-04-04 ENCOUNTER — Ambulatory Visit: Payer: Self-pay | Admitting: Pain Medicine

## 2014-04-05 ENCOUNTER — Ambulatory Visit: Payer: Self-pay | Admitting: Pain Medicine

## 2014-04-12 ENCOUNTER — Ambulatory Visit: Payer: Self-pay | Admitting: Pain Medicine

## 2014-04-25 ENCOUNTER — Ambulatory Visit: Payer: Self-pay | Admitting: Pain Medicine

## 2014-05-17 ENCOUNTER — Ambulatory Visit: Payer: Self-pay | Admitting: Pain Medicine

## 2014-06-13 ENCOUNTER — Ambulatory Visit: Payer: Self-pay | Admitting: Pain Medicine

## 2014-06-21 ENCOUNTER — Ambulatory Visit: Payer: Self-pay | Admitting: Pain Medicine

## 2014-07-10 ENCOUNTER — Emergency Department: Payer: Self-pay | Admitting: Emergency Medicine

## 2014-07-10 LAB — CBC WITH DIFFERENTIAL/PLATELET
Basophil #: 0 10*3/uL (ref 0.0–0.1)
Basophil %: 0.3 %
EOS ABS: 0.2 10*3/uL (ref 0.0–0.7)
Eosinophil %: 1.5 %
HCT: 45.1 % (ref 35.0–47.0)
HGB: 15.2 g/dL (ref 12.0–16.0)
Lymphocyte #: 2.6 10*3/uL (ref 1.0–3.6)
Lymphocyte %: 24.3 %
MCH: 31.2 pg (ref 26.0–34.0)
MCHC: 33.7 g/dL (ref 32.0–36.0)
MCV: 93 fL (ref 80–100)
MONO ABS: 0.8 x10 3/mm (ref 0.2–0.9)
MONOS PCT: 7 %
NEUTROS ABS: 7.2 10*3/uL — AB (ref 1.4–6.5)
Neutrophil %: 66.9 %
Platelet: 344 10*3/uL (ref 150–440)
RBC: 4.86 10*6/uL (ref 3.80–5.20)
RDW: 13.6 % (ref 11.5–14.5)
WBC: 10.8 10*3/uL (ref 3.6–11.0)

## 2014-07-10 LAB — COMPREHENSIVE METABOLIC PANEL
ALT: 34 U/L
ANION GAP: 8 (ref 7–16)
Albumin: 3.5 g/dL (ref 3.4–5.0)
Alkaline Phosphatase: 75 U/L
BUN: 17 mg/dL (ref 7–18)
Bilirubin,Total: 0.3 mg/dL (ref 0.2–1.0)
CREATININE: 0.88 mg/dL (ref 0.60–1.30)
Calcium, Total: 8.5 mg/dL (ref 8.5–10.1)
Chloride: 100 mmol/L (ref 98–107)
Co2: 29 mmol/L (ref 21–32)
EGFR (African American): >60
Glucose: 99 mg/dL (ref 65–99)
Osmolality: 275 (ref 275–301)
Potassium: 3.3 mmol/L — ABNORMAL LOW (ref 3.5–5.1)
SGOT(AST): 23 U/L (ref 15–37)
Sodium: 137 mmol/L (ref 136–145)
Total Protein: 7.2 g/dL (ref 6.4–8.2)

## 2014-07-10 LAB — D-DIMER(ARMC): D-Dimer: 521 ng/ml

## 2014-07-18 ENCOUNTER — Ambulatory Visit: Payer: Self-pay | Admitting: Pain Medicine

## 2014-07-26 ENCOUNTER — Ambulatory Visit: Payer: Self-pay | Admitting: Pain Medicine

## 2014-08-25 DIAGNOSIS — I469 Cardiac arrest, cause unspecified: Secondary | ICD-10-CM

## 2014-08-25 HISTORY — DX: Cardiac arrest, cause unspecified: I46.9

## 2014-08-29 ENCOUNTER — Ambulatory Visit: Payer: Self-pay | Admitting: Pain Medicine

## 2014-09-06 ENCOUNTER — Ambulatory Visit: Payer: Self-pay | Admitting: Pain Medicine

## 2014-10-05 ENCOUNTER — Ambulatory Visit: Payer: Self-pay | Admitting: Pain Medicine

## 2014-10-18 ENCOUNTER — Ambulatory Visit: Payer: Self-pay | Admitting: Pain Medicine

## 2014-12-15 NOTE — Consult Note (Signed)
Brief Consult Note: Diagnosis: 49 yr old female with hypotension/dizziness/near syncope at  home.has abdominal pain/heavy bleeding,/reuqring percocet/phenergan.poor po intake for few days.   Patient was seen by consultant.   Consult note dictated.   Recommend further assessment or treatment.   Orders entered.   Discussed with Attending MD.   Comments: near syncope/due to hypotension for volumedepletion with bleeding/poor po intake/narcotic induced and also due to continued use of bp meds pt already  got 5 litres fluids,getting 6th litre get chest xray,advised to check ambulatory bp machine at home,limit use of pain medications  2.slightly elevated AST and ALT;started on statins;woud need closemonitoring of LFTas out pt,no need to stop Statin now 3.elevated glucose;check hba1c 4/slight anemia,likley hemodilutional d/w dr,Stephen Glennon Mac.  Electronic Signatures: Epifanio Lesches (MD)  (Signed 18-May-14 16:23)  Authored: Brief Consult Note   Last Updated: 18-May-14 16:23 by Epifanio Lesches (MD)

## 2014-12-15 NOTE — Op Note (Signed)
PATIENT NAME:  Claire Mcdonald, Claire Mcdonald MR#:  [id number] DATE OF BIRTH:  Aug 10, 1966  DATE OF PROCEDURE:  01/04/2013  PREOPERATIVE DIAGNOSES: Chronic pelvic pain, endometrial thickening.   POSTOPERATIVE DIAGNOSES:  Chronic pelvic pain, endometrial thickening.  PROCEDURE PERFORMED: Operative laparoscopy with bilateral ovarian cystectomy, lysis of adhesions and resection of tubal cyst. Hysteroscopy, dilation and curettage procedure with partial resection of endometrium.   SURGEON: Glean Salen, M.D.   ANESTHESIA: General.   ESTIMATED BLOOD LOSS: Minimal.   COMPLICATIONS: None.   FINDINGS: Bilateral ovarian cysts, left greater than right; bilateral tubal cysts, thickened endometrium but no polyps or fibroids visualized.   DISPOSITION: To the recovery room in stable condition.   TECHNIQUE: The patient is prepped in the usual sterile fashion after adequate anesthesia is obtained in the dorsal lithotomy position. A speculum is placed. Bladder is drained and a tenaculum is placed over the anterior lip of the cervix.   Attention is then turned to the abdomen where a Veress needle inserted through a 5 mm infraumbilical incision after Marcaine is used to anesthetize the skin. Veress needle placement is confirmed using the hanging drop technique and the abdomen is then insufflated with CO2 gas. A 5 mm trocar is then inserted under direct visualization with the laparoscope with no injuries or bleeding noted. A left upper quadrant incision 5 mm is also placed for better visualization purposes due to her size and anatomy orientation. A right lower quadrant 11 mm trocar is placed in the lateral to the inferior epigastric blood vessels with no injuries or bleeding noted.   Above-mentioned findings are visualized. The left large ovarian cyst is grasped and is excised using a 5 mm Harmonic scalpel. The edges are cauterized with bipolar cautery device but excellent hemostasis is noted. Tubal cysts near this area as well  as underneath the uterus as well as on the right tube are excised. Adhesions, particularly of colon and omentum to right adnexa and uterus, are carefully dissected with no apparent injury to bowel, bladder or other structures. Ureters are out of harm's way the whole time. Right ovarian cyst was grasped, is dissected with drainage of fluid. It appears to be a corpus luteal cyst. A part of the cyst wall is excised and sent to pathology for further review. Excellent hemostasis is noted at all operative sites.   Gas is expelled. Trocars are removed. The right lower quadrant incision is closed with 2-0 Vicryl suture in the fascia and then the skin is closed with Dermabond at all 3 sites.   Hysteroscopy is performed with saline distention of the intrauterine cavity and the hysteroscope is inserted with the above-mentioned findings visualized. Using the MyoSure device, resection of the anterior uterine wall is performed for specimen procurement purposes and this was what was probably seen on ultrasound and correlates with her findings of heavy bleeding. This will be sent to pathology for further review to rule out hyperplasia or carcinoma. There is no apparent visual findings of cancer at this time. There are no other polyps or fibroids. Bilateral ostia are clearly visualized. The hysteroscope is removed with a 0 mL discrepancy of saline fluid during the course of the hysteroscopy procedure. The tenaculum is also removed with excellent hemostasis noted. The patient goes to the recovery room in stable condition. All sponge, instrument and needle counts are correct.    ____________________________ R. Barnett Applebaum, MD rph:cs D: 01/04/2013 19:26:00 ET T: 01/04/2013 20:10:26 ET JOB#: [id number]  cc: Glean Salen, MD, <Dictator> Herbie Baltimore  Salli Quarry MD ELECTRONICALLY SIGNED 01/05/2013 15:25

## 2014-12-15 NOTE — Consult Note (Signed)
Brief Consult Note: Diagnosis: abd pain, UTI.   Patient was seen by consultant.   Consult note dictated.   Recommend further assessment or treatment.   Discussed with Attending MD.   Comments: Pt seen and examined. No peritoneal signs. UTI evident and pt had not filled Rx for UTI yet. probable small hematoma in RLQ, no sign of extravasation or perf. Nonsurgical abd. Discussed with Dr Kenton Kingfisher and ER MD. Available if needed.  Electronic Signatures: Florene Glen (MD)  (Signed 19-May-14 20:06)  Authored: Brief Consult Note   Last Updated: 19-May-14 20:06 by Florene Glen (MD)

## 2014-12-15 NOTE — H&P (Signed)
 Subjective/Chief Complaint "Weakness, nausea, dizzyness"   History of Present Illness 49 yo female who is post-op day #4 s/p diagnostic laparoscopy, removal of bilateral fallopian tubes and D&C.  She presents for the third time to the Emergency Department since her surgery.  She states that her reason for presentation today is that she is concerned for her bleeding being heavier than yesterday (she has been concerned for her vaginal bleeding on other occasions since her surgery).  Additionally she has had weakness and dizzyness today - nearly falling once. She also feels quite weak.  She has had nausea and emesis since her surgery that has been difficult to control.  Her boyfriend, who accompanies her, states that he took her temp last night and obtained a temp 101.5F.  She had an episode of diarrhea last night, also.   Past Med/Surgical Hx:  Restless Leg Syndrome:   Hyperlipidemia:   depression:   back pain:   htn:   Ovarian Cysts requiring surgical removal:   tubal ligation:   ALLERGIES:  Zofran ODT: Hives, Itching  Ketorolac: Hives, Itching  Ultram: Hives, Itching  HOME MEDICATIONS: Medication Instructions Status  Percocet 5/325 325 mg-5 mg oral tablet 1 tab(s) orally every 3 hours, As Needed - for Pain Active  promethazine 25 mg oral tablet 1 tab(s) orally every 8 hours, As Needed - for Nausea, Vomiting Active  omeprazole 20 mg oral delayed release capsule 1 cap(s) orally 2 times a day Active  DULoxetine 60 mg oral delayed release capsule 1 cap(s) orally once a day (in the morning) Active  furosemide 20 mg oral tablet 1 tab(s) orally once a day (in the morning) Active  metoprolol succinate 100 mg oral tablet, extended release 1 tab(s) orally once a day (in the morning) Active   Family and Social History:  Family History Non-Contributory   Social History negative tobacco, negative ETOH, negative Illicit drugs   Place of Living Home   Review of Systems:  Fever/Chills Yes    Cough No   Sputum No   Abdominal Pain Yes   Diarrhea Yes   Constipation No   Nausea/Vomiting Yes   SOB/DOE No   Chest Pain No   Dysuria Yes   Tolerating Diet Yes   Medications/Allergies Reviewed Medications/Allergies reviewed   Physical Exam:  GEN well developed, well nourished, no acute distress, Vitals SIgns: Temp 37.0C, P 64-75, RR 12-16, BP 97 - 103/45-59, O2 sats 95-100% RA, pain 9-10/10   NECK supple  trachea midline   RESP normal resp effort  mild expiratory wheezes   CARD regular rate  rhythm regular   ABD soft, +tenderness to palpation especially just to the right of her umbilicus and suprapubically   GU superpubic tenderness   EXTR negative cyanosis/clubbing, negative edema   SKIN normal to palpation   NEURO motor/sensory function intact   PSYCH A+O to time, place, person   Additional Comments Labs from previous visits: 5/6 CBC WBC = 7.2, HGB = 13.4, HCT = 38.4 5/14> CBC WBC =12.7, HGB = 12.5, HCT = 36.4 5/17 CBC WBC = 8.6, HGB = 11.3, HCT = 33.3   Lab Results: Hepatic:  17-May-14 11:44   Bilirubin, Total 0.2  Alkaline Phosphatase 119  SGPT (ALT)  106  SGOT (AST)  128  Total Protein, Serum  6.3  Albumin, Serum  2.9  Routine Chem:  17-May-14 11:44   BUN 11  Creatinine (comp) 0.77  Sodium, Serum 142  Potassium, Serum 3.5  Chloride, Serum 106    CO2, Serum 32  Calcium (Total), Serum  7.9  Osmolality (calc) 282  eGFR (African American) >60  eGFR (Non-African American) >60 (eGFR values <54m/min/1.73 m2 may be an indication of chronic kidney disease (CKD). Calculated eGFR is useful in patients with stable renal function. The eGFR calculation will not be reliable in acutely ill patients when serum creatinine is changing rapidly. It is not useful in  patients on dialysis. The eGFR calculation may not be applicable to patients at the low and high extremes of body sizes, pregnant women, and vegetarians.)  Anion Gap  4  Routine UA:   17-May-14 13:20   Color (UA) Yellow  Clarity (UA) Hazy  Glucose (UA) Negative  Bilirubin (UA) Negative  Ketones (UA) Negative  Specific Gravity (UA) 1.030  Blood (UA) 1+  pH (UA) 5.0  Protein (UA) Negative  Nitrite (UA) Negative  Leukocyte Esterase (UA) Negative (Result(s) reported on 08 Jan 2013 at 01:43PM.)  RBC (UA) 2 /HPF  WBC (UA) 4 /HPF  Bacteria (UA) NONE SEEN  Epithelial Cells (UA) 3 /HPF  Mucous (UA) PRESENT (Result(s) reported on 08 Jan 2013 at 01:43PM.)  Routine Hem:  17-May-14 11:44   WBC (CBC) 8.6  RBC (CBC)  3.69  Hemoglobin (CBC)  11.3  Hematocrit (CBC)  33.3  Platelet Count (CBC) 258 (Result(s) reported on 08 Jan 2013 at 11:57AM.)  MCV 90  MCH 30.6  MCHC 33.9  RDW 12.7    Assessment/Admission Diagnosis 49yo Female post-op day #4 s/p diagnostic laparoscopy, removal of ovarian cysts, bilateral salpingectomy, D&C/hysteroscopy who presents for the thrird time since surgery with uncontrolled pain and hypotension with multiple other complaints   Plan 1) hypotension: Will watch overnight.  I have compared her blood pressures today to those in clinic and they to appear lower.  She appears to be going through a brief postoperative period where her blood pressure medication is causing a large drop in her blood pressure and, given that the BP medication is a beta blocker, not allowing for a compensatory rise in heart rate. Will observe tonight and likely d/c her home on a lower dose of metoprolol.  2) pain: will adjust pain medication to get pain level safely acceptable  3) vaginal bleeding post-operatively: Blood counts are reassuring overall given her description.  Will monitor for now. Pathology returned as benign from her surgery.  4) Nausea/emesis: control prn  5) prophylaxis: sequential compression stockings/ prilosec  6) dispo: home in AM, if clinically stable.   Electronic Signatures: JWill Bonnet(MD)  (Signed 17-May-14 18:00)  Authored: CHIEF  COMPLAINT and HISTORY, PAST MEDICAL/SURGIAL HISTORY, ALLERGIES, HOME MEDICATIONS, FAMILY AND SOCIAL HISTORY, REVIEW OF SYSTEMS, PHYSICAL EXAM, LABS, ASSESSMENT AND PLAN   Last Updated: 17-May-14 18:00 by JWill Bonnet(MD)

## 2014-12-15 NOTE — Consult Note (Signed)
PATIENT NAME:  Claire Mcdonald, Claire Mcdonald MR#:  [id number] DATE OF BIRTH:  March 01, 1966  DATE OF CONSULTATION:  01/10/2013  CONSULTING PHYSICIAN:  Jerrol Banana. Burt Knack, MD  CHIEF COMPLAINT:  Abdominal pain.   HISTORY OF PRESENT ILLNESS: This is a 49 year old, obese female patient who is approximately 6 days postop  from a D and C hysteroscopy, laparoscopy with lysis of adhesions, bilateral ovarian cystectomy and excision of tubal cyst and endometrial resection partial, by Dr. Barnett Applebaum. This was done 6 days ago. The patient has been readmitted for abdominal pain, and was discharged this morning and came back to the Emergency Room again with abdominal pain, which has never gone away. She describes considerable dysuria. She had blood in her urine yesterday, none today. She vomited earlier today, one time only. Has been able to keep liquids down. She was diagnosed with a urinary tract infection on discharge, and was given a prescription for antibiotics. She has not yet filled those.   I was asked to see the patient for abnormal CT findings and continued abdominal pain in the postoperative setting.   Dr. Marina Gravel had been consulted originally and spoke with Dr. Kenton Kingfisher, and relayed the history to me. I obtained the history from Dr. Marina Gravel, the chart, and from the patient and husband.   PAST MEDICAL HISTORY:  Hypertension, morbid obesity, with a BMI of 38, leg swelling.   PAST SURGICAL HISTORY: Tubal ligation and recent GYN surgery. See above for complete details.   ALLERGIES:  KETOROLAC, ULTRAM and ZOFRAN.   MEDICATIONS:  See list in chart.   FAMILY HISTORY:  Noncontributory.   SOCIAL HISTORY:  The patient is not employed. Does not smoke or drink.   REVIEW OF SYSTEMS:  A 10-system review is performed and negative, with the exception of that mentioned in the HPI.  Of note, she has dysuria and hematuria, and has been diagnosed with a UTI.   PHYSICAL EXAMINATION: GENERAL:  A healthy, obese, comfortable female patient,  219 pounds, BMI of 38.  VITAL SIGNS: Temp of 98.3, pulse 71, respirations 20, blood pressure 116/71, 98% room air sat. She has a pain scale ranging from 8 to 10.  HEENT:  Shows no scleral icterus, no palpable neck nodes.  CHEST:  Clear to auscultation.  CARDIAC:  Regular rate and rhythm.  ABDOMEN:  Soft, nondistended. Multiple healing scars are noted, with only minimal erythema, No drainage. The abdomen shows no percussion tenderness. No rebound tenderness. No guarding. Somewhat diffuse minimal tenderness throughout, more on the right side than on the left, but she does have some left-sided tenderness as well. It is very minimal.  EXTREMITIES:  Show moderate edema.  NEUROLOGIC:  Grossly intact.  INTEGUMENT:  Shows no jaundice.   DIAGNOSTIC DATA:  CT scan is personally reviewed, as is the operative report. A small area in the right lower quadrant suggestive of a small hematoma. No extravasation of contrast, and no obvious signs of perforation. Today her white blood cell count is 10, it was 7, H and H of 10.8 and 32, with a platelet count of 258. Electrolytes are within normal limits. AST and ALT are elevated. Albumin and protein are low.  ASSESSMENT AND PLAN:  This is a patient with a urinary tract infection. Her urinalysis is grossly abnormal, with 3+ leukocyte esterase and 575 white cells per high-power field. She was diagnosed with a UTI, but did not get her prescription filled yet. She does have some diffuse abdominal pain, but no peritoneal signs. A  nonsurgical abdomen.   I discussed these findings with Dr. Kenton Kingfisher, who has seen the patient as well and knows the patient quite well. In light of the fact that she does not have a surgical abdomen, I discussed with the patient and with Dr. Kenton Kingfisher and with the ER physician, disposition either home or in the hospital. She is able to keep liquids, and could be started on the antibiotics that were ordered for her earlier today, and Dr. Kenton Kingfisher could follow  her up as an outpatient. Otherwise, I see no reason to admit this patient, but I will leave disposition up to the ER physician and Dr. Kenton Kingfisher, who I spoke to personally.     ____________________________ Jerrol Banana Burt Knack, MD rec:mr D: 01/10/2013 20:13:00 ET T: 01/10/2013 20:48:41 ET JOB#: [id number]  cc: Jerrol Banana. Burt Knack, MD, <Dictator> Florene Glen MD ELECTRONICALLY SIGNED 01/11/2013 1:21

## 2014-12-15 NOTE — Consult Note (Signed)
PATIENT NAME:  Claire Mcdonald MR#:  [id number] DATE OF BIRTH:  [birth year]  DATE OF CONSULTATION:  01/09/2013  ER REFERRING PHYSICIAN:  Prentice Docker, MD  CONSULTING PHYSICIAN:  Claire Lesches, MD PRIMARY CARE PHYSICIAN: Claire Pop, MD   REASON FOR CONSULTATION: Near syncope, hypotension.   HOSPITAL COURSE: The patient is a 49 year old female patient who had a laparoscopy with bilateral ovarian cystectomy, lysis of adhesions and resection of tubal cyst. The patient also had D and C on May 13th.  The patient went home and came to the ER on 3 occasions, on May 14th, 16th and 17th for feeling dizzy and having significant bleeding, and also the patient had abdominal pain. She was discharged from the ER on the 14th and 16th but was admitted on the 17th because the patient had almost like a near-syncope yesterday morning, and also she was found to be having low blood pressure by EMS. Medical consult was requested OB/GYN because of hypotension, near syncope. History was obtained from the patient and also husband.  According to them, the patient had severe bleeding and also abdominal pain. The patient takes Percocet 5/325 every 3 hours and Phenergan 25 mg t.i.d., had very poor p.o. intake for the past 3 days with nausea, and the patient was feeling very dizzy.  The patient is on metoprolol, Toprol-XL 100 mg daily and Lasix 20 mg daily for blood pressure, and she been taking those medications without any break; and when she came to the Emergency Room, the patient's blood pressure was low, and it was around 98/59, 97/45 with heart rate 75.  She was admitted to observation because of the hypotension and near syncope. The patient already received 5 liters of normal saline, and this is her 6th pack going.  The patient right now is on Ringer's lactate at 100/mL/h. This is the 6th liter of fluids since yesterday. The patient's blood pressure varies around 104/58 to 94/57.  Heart rate is around 84.  The patient  denies any dizziness but had a fall this morning; and, according to the nurse, she is requiring Percocet 5/325 every 3 hours, and she is also getting Phenergan 12.5 every 6 hours.  She still has abdominal pain, but bleeding is better than yesterday. Denies any shortness of breath or chest pain.   PAST MEDICAL HISTORY:  Significant for hypertension.   MEDICATIONS: The patient is on: 1.  Lasix 20 mg daily at home.  2.  Metoprolol Toprol-XL 100 mg daily.  3.  Cymbalta  60 mg in the morning.   ALLERGIES: TO KETOROLAC, ULTRAM, ZOFRAN.    SOCIAL HISTORY: No smoking, no drinking. Lives with significant other.   FAMILY HISTORY: Significant for hypertension and diabetes, Mother's side.   MEDICATIONS: She is on Cymbalta 60 mg p.o. daily, and she is also on Percocet 5/325,  2 tablets every 4 to 6 hours p.r.n.   REVIEW OF SYSTEMS:  CONSTITUTIONAL: The patient denies any weakness, does have abdominal pain.  HEAD: No headache.  EARS, NOSE AND THROAT: No ear pain. No epistaxis. No difficulty swallowing.  LUNGS: The patient has no trouble breathing.  CARDIOVASCULAR: No chest pain.  GASTROINTESTINAL: The patient has some nausea, and the patient also has some abdominal pain status post surgery.   GENITOURINARY: No dysuria.  PSYCHIATRIC: Mood and affect are within normal limits. The patient has no anxiety or depression.   PHYSICAL EXAMINATION: VITAL SIGNS: Temperature 98.4, heart rate 84, blood pressure is 94/57. This is at 12:30, and the  patient's blood pressure around 10:00 was 106/58, saturation 99% on room air.  GENERAL: Alert, awake, oriented, obese female answering questions appropriately.   HEENT: Head atraumatic, normocephalic. Pupils are equally reacting to light. Extraocular movements are intact.  EARS, NOSE AND THROAT:  No tympanic membrane congestion. No turbinate hypertrophy. No oropharyngeal erythema.  NECK: Normal range of motion. No JVD. No carotid bruit.  CARDIOVASCULAR: S1, S2  regular. No murmurs.  LUNGS: The patient has some expiratory wheeze in the right upper lobe.  ABDOMEN: Soft. The patient's bowel sounds are present. Slight tenderness present in the right and left lower quadrants status post laparoscopy.  EXTREMITIES: Trace edema present.   LABORATORY AND RADIOLOGICAL DATA:  Glucose 117. WBC 7.7, hemoglobin 10.1, hematocrit 30.1, platelets 222. Electrolytes: Sodium 141, potassium 4.2, chloride is 107, bicarb 29, BUN 10, creatinine 0.75, glucose 131. LFTs: AST is 96, ALT is 106 and total protein is 5.3. BNP is elevated slightly at 370 on May 14th. Hemoglobin is around 10.1, which is stable.   EKG: Normal sinus at 67 beats per minute.   ASSESSMENT AND PLAN: A 49 year old female status post laparoscopy with cyst removal and D and C.  The patient is admitted for:  Hypotension and also near syncope to GYN unit. She already got 6 liters of fluid.  I believe patient dizziness is likely secondary to heavy bleeding with poor p.o. intake, and also the patient was on blood pressure medications at home.  All of that has contributed to low blood pressure and feeling dizzy and near syncope. Right now. The patient's blood pressure is slightly low, but it could be secondary to pain medications; and the patient's volume status looks okay, and she does not look dehydrated. She does not look pale.  We are going to stop the IV fluids as she does have some wheezing. The patient will have a chest x-ray.    The patient had a fall this morning likely secondary to pain medications.  I discussed with Dr. Prentice Mcdonald about changing the pain medications probably to Norco at bedtime to see if she can take ibuprofen and start Percocet or Norco.  She said she is not getting enough pain relief with ibuprofen, so I told her to increase the duration that she can be off the pain medication and see how she does; so, she will be on the GYN/OB observation unit until tomorrow.  If the symptoms improve,  she will be going home. I advised her to check the blood pressure at home and also the heart rate, and if she is persistently low on the blood pressure, I advised to hold the metoprolol and Lasix and restart the beta blocker at a lower dose like 25 mg of Toprol if the blood pressure goes up to around 130/80 and also heart rate goes up to more than 90. The patient has heavy vaginal bleeding which is expected after surgery. Hemoglobin is stable. No indication for transfusion.  The patient's hemoglobin is a little low today, but it could be because of hemodilution.    Elevated blood sugar.  The patient will have  hemoglobin A1c checked.    Slightly abnormal AST and ALT, better than yesterday.  Watch LFTs closely.  The patient's simvastatin can be continued because AST and ALT are slightly elevated, but the patient needs monitoring with outpatient LFTs.   I discussed the plan with Dr. Prentice Mcdonald.     TIME SPENT: About 55 minutes.   ____________________________ Claire Lesches, MD sk:cb  D: 01/09/2013 16:17:28 ET T: 01/09/2013 21:15:37 ET JOB#: [id number]  cc: Claire Lesches, MD, <Dictator> Will Bonnet, MD Claire Lesches MD ELECTRONICALLY SIGNED 01/18/2013 22:56

## 2014-12-16 NOTE — Consult Note (Signed)
PATIENT NAME:  Claire, Mcdonald MR#:  [id number] DATE OF BIRTH:  Nov 11, 1965  DATE OF CONSULTATION:  10/18/2013  REFERRING PHYSICIAN:   CONSULTING PHYSICIAN:  Delsa Sale, MD  INDICATIONS FOR ADMISSION: Right lower quadrant pain, leukocytosis, possible appendicitis.   HISTORY OF PRESENT ILLNESS: A 49 year old woman who was admitted to the service by Dr. Job Founds for possible appendicitis. CT scan showed no appendicitis and no inflammatory process in the abdomen, but the patient continued to have a pressure-like sensation in her right lower quadrant, extending to the left lower quadrant. The patient had received a Kenalog shot in February, and her PCP the date of admission, her white cell count was 17.5 thousand. In the hospital, it was 13,000. The patient has been seen for this same thing several times in 2014, with bouts of abdominal pain. After CT was normal. (Dictation Anomaly)  OB/GYN was consulted to see if this is something related to her past history of ovarian cysts and tubal ligation performed by Dr. Kenton Kingfisher in 2014.   PAST MEDICAL HISTORY: The patient is a 49 year old, G5, P 4-0-1-4 with 4 vaginal deliveries and an abortion she does not want to talk about, laparoscopic tubal ligation and ovarian cystectomy bilaterally, performed by Dr. Kenton Kingfisher in 2014. The patient has essential hypertension, treated with medications, Toprol and Lasix. She has reflux treated with Prilosec; migraines treated with Topamax; and depression treated with Cymbalta.  REVIEW OF SYSTEMS: The patient denies any fever or chills. She does have some nausea at this time. No dysuria. She describes a long-going history of vaginal bleeding, irregular, but is not currently bleeding vaginally. The patient also states she has had bouts of diarrhea over the last several months that comes and goes. She had not been able to relate it to anything.   PAST SURGICAL HISTORY: The patient had a tubal ligation, laparoscopy with ovarian cyst  removal.   PHYSICAL EXAMINATION: VITAL SIGNS: The patient is afebrile. Blood pressure is 112/81, 100% saturation.  HEAD AND NECK:  Unremarkable. No lymphadenopathy. No thyromegaly.  CHEST:  Clear to auscultation.  CARDIAC: Regular rate and rhythm.  ABDOMEN: Soft, with diffuse tenderness, but to deep, slow palpation, no rebound, guarding or pain, as the patient spoke through exam.   LABORATORY DATA: White cell count 13,000. CT scan shows an umbilical hernia with no bowel involvement, no evidence of appendicitis, inflammatory process or ovarian cysts. The patient does have diverticulosis without evidence of diverticulitis.   ASSESSMENT:  1.  Pelvic pain with intermittent diarrhea, possibility of irritable bowel syndrome versus colitis or inflammatory reaction to a food she eats. The patient states that she drinks lots of milk. Patient is told to stop all wheat, eggs, and milk for two weeks, and then slowly add back one at a time over a week, and keep a diary of her symptoms to see if this helps. The patient was also told to ask her primary care physician for a gastroenterology consult so that they can evaluate for irritable bowel syndrome.  2.  Pelvic pain. Patient's ultrasound, transvaginal, was scheduled today, however, given emergencies in the Emergency Room, the patient's ultrasound is postponed until tomorrow morning. This to make sure there are no cysts or anything on the ovaries or uterus that needs to be addressed at this time.  3.  Vaginal bleeding, not currently, but patient states she is tired of the heavy bleeding, and she is told to request a referral from her primary care physician to Dr. Kenton Kingfisher for  surgical preparation and for treatment of the urinary incontinence that she also says she is having.  4.  Leukocytosis. Repeat WBC in the morning. The patient is told the white cell count may be from the steroid injection that she got, but at this time, there is nothing requiring a surgical  procedure, and that if the white cell count resolves, this may just be an irritable bowel or some other kind of bowel issue. At this time, there is nothing that needs to be done or performed except for the ultrasound to be sure there is nothing that we are missing. The patient will follow up in our office with Dr. Kenton Kingfisher for surgical consult. The patient is aware of the dietary restrictions and we will continue care with you.   Thank you very much for the consult. Please call if you have any questions.     ____________________________ Delsa Sale, MD cck:cg D: 10/18/2013 22:37:19 ET T: 10/18/2013 23:39:21 ET JOB#: [id number]  cc: Delsa Sale, MD, <Dictator> Delsa Sale MD ELECTRONICALLY SIGNED 10/20/2013 12:56

## 2014-12-16 NOTE — H&P (Signed)
PATIENT NAME:  Claire Mcdonald, Claire Mcdonald MR#:  [id number] DATE OF BIRTH:  [birth year]  DATE OF ADMISSION:  10/17/2013  INDICATION FOR ADMISSION: Right lower quadrant pain, leukocytosis.   CLINICAL NOTE: This 49 year old woman reports that she was in usual state of health until the morning of February 20 when she awoke from a normal night's sleep with pain in the lower abdomen, especially in the right lower quadrant. This was reported as a pressure-like sensation. It was not associated with nausea, vomiting or diarrhea. It was not similar to pain she had experienced in spring of 2014 when she was identified with bilateral ovarian cysts. The pain waxed and waned but still increased throughout the day, but she did not seek medical attention at that time. On the morning of Saturday, February 21, she reported a significant diminution in her pain and was able to go about most of her activities. She has experienced no loss of appetite during this time. She reports daily spontaneous bowel movements without diarrhea. She reports that yesterday morning, February 22, the pain had returned, similar to what she experienced on February 20, primarily a pressure sensation in the right lower quadrant. She was seen at her primary care physician's office this morning, where a white blood cell count of 17,500 with 72% polys with a clinical impression of acute appendicitis. The patient was admitted to the hospital for pain management and further diagnostic studies.   PAST MEDICAL HISTORY: Notable for several episodes of abdominal pain, being evaluated in the Emergency Department in October 2014, May 2014, and April 2014. She has a long-standing history of essential hypertension for approximately 5 years managed with Toprol-XL 100 mg daily, Lasix 20 mg daily; history of reflux managed with Prilosec 20 mg daily; a long-standing history of migraines managed with Topamax 100 mg b.i.d.; and a history of depression managed with Cymbalta 60 mg daily.  The patient in the distant past has made use of Naprosyn.   The patient denies any fever or chills. No nausea or vomiting. No dysuria. No vaginal bleeding.   PAST SURGICAL HISTORY: Notable for a tubal ligation and laparoscopy with ovarian cyst removal as noted above.   PHYSICAL EXAMINATION:  VITAL SIGNS: At the time of admission, the patient was noted to be afebrile with a temperature of 98.1, pulse 71, respirations 17, and a blood pressure of 127/91. Saturation was 95% on room air.  HEAD AND NECK: Unremarkable. Mucous membranes were slightly dry. No pharyngeal erythema. No thyromegaly or cervical adenopathy.  CHEST: Clear to auscultation.  CARDIAC: Regular rhythm without murmur or gallop.  ABDOMEN: Obese and soft, with diffuse tenderness from the right upper quadrant extending down to the right lower quadrant, hypogastrium more so than any other area, and then into the left lower quadrant. There was no peritoneal irritation, guarding, or referred pain. No periumbilical tenderness. No evidence of inguinal or femoral hernias. No inguinal adenopathy.  EXTREMITIES: The patient has very sizable lower extremities, which she attributes to edema. No pitting was appreciated. There was no swelling of her feet. Pedal pulses were intact.   LABORATORY AND RADIOLOGICAL DATA: Laboratory studies submitted by her primary care physician showed a white blood cell count of 17,500, hemoglobin 14.3 with a hematocrit of 43, MCV of 92.5, platelet count of 430,000, 72% neutrophils, 21% lymphocytes.   Urinalysis: Dipstick was negative with a specific gravity of 1.010 with a pH of 5.5.   CT scan of the abdomen and pelvis completed with oral and IV contrast was  reviewed, as well as reviewed with David Martinique, MD, from radiology. No evidence of appendicitis or right lower quadrant inflammatory process. An umbilical hernia was appreciated without evidence of bowel involvement. No evidence of gallstones.   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   Basic metabolic panel is unremarkable with a creatinine of 0.93 and normal electrolytes.   ASSESSMENT AND PLAN: Etiology for the patient's pain is unclear at this time. She will be maintained on clear liquids. A repeat CBC will be obtained in the morning, and further followup will take place based on those studies.    ____________________________ Robert Bellow, MD jwb:jcm D: 10/17/2013 16:20:20 ET T: 10/17/2013 16:53:41 ET JOB#: [id number]  cc: Robert Bellow, MD, <Dictator> [place] MD ELECTRONICALLY SIGNED 10/18/2013 0:52

## 2014-12-16 NOTE — Discharge Summary (Signed)
PATIENT NAME:  Claire Mcdonald, Claire Mcdonald MR#:  [id number] DATE OF BIRTH:  06/15/1966  DATE OF ADMISSION:  10/17/2013 DATE OF DISCHARGE:  10/19/2013  DISCHARGE DIAGNOSES:  1.  Abdominal pain, leukocytosis.  2.  Morbid obesity.  3.  Migraine headaches.  4.  Gastroesophageal reflux.  5.  Essential hypertension.  6.  Fluid retention.  7.  Depression.   CLINICAL NOTE: This 49 year old woman presented to her primary care provider on the morning of February 23 with, at that time, a 3-day history of abdominal pain, primarily in the lower abdomen. Details are included in the history and physical. Her symptoms had been pronounced for the preceding 24 hours and with a white blood cell count of 17,000, she was sent for surgical evaluation.   PAST MEDICAL HISTORY: Notable for essential hypertension, morbid obesity, depression, severe back pain with spondylolisthesis under care of the pain management clinic and migraine headaches.   Clinical examination at the time of admission showed some mild lower abdominal tenderness, but no exam consistent with acute appendicitis. CT scan obtained on the day of admission was notable for a normal appendix and no evidence of a right lower quadrant inflammatory process. A small umbilical hernia with incarcerated fat was identified, but no evidence of entrapped bowel or associated soft tissue inflammation of the mesentery. Diverticulosis without diverticulitis was appreciated. She was admitted for IV hydration and observation. The morning after admission, while she reported no significant improvement in her pain, her abdominal exam remained unremarkable and her white blood cell count had come down to 13,000. The patient had been seen in the pain clinic approximately 10 days earlier and received a total of 40 mg of Kenalog as part of a multilevel injection by Mohammed Kindle, M.D.  Due to a previous GYN history requiring a laparoscopy and ovarian cystectomy a year earlier, consultation with  the GYN service was obtained and this was completed by Erik Obey on the evening of February 24. While the patient had originally denied any GI symptoms prior to presentation, she reported to Dr. Cristino Martes that she had had diarrhea going on for months, predating a course of oral Levaquin supplied in early February for upper respiratory infection. Dr. Bonnell Public assessment considered the possibility of IBS versus colitis or an inflammatory reaction to food. Pelvic pain of unclear etiology for which pelvic ultrasound was requested. Because of her history of intermittent vaginal bleeding reassessment with her primary gynecologist, Barnett Applebaum, M.D. was recommended. Come the morning of post admission day 2, the patient's white blood cell count had fallen to 9900 with a normal differential. Sedimentation rate was normal at 18. Her abdominal exam continued to be unremarkable, although she complained of pain in the lower abdomen. She had tolerated a soft diet without difficulty based on her report, although poorly documented in the clinical record by the nursing staff.   After pelvic ultrasound showed no significant pathology, she was felt to be a candidate for discharge home.   MEDICATIONS AT [facility]: Included metoprolol 100 mg daily, Topamax 100 mg b.i.d., Lasix 20 mg daily, Cymbalta 60 mg daily, Zolpidem 5 mg at bedtime, Prilosec OTC 20 mg daily and a prescription for Norco 5/325 #30 with the inscription 1 p.o. q.4 hours p.r.n. as needed for pain with no refills.   Arrangements were to be made for follow up with Barnett Applebaum, M.D. from [facility] for further assessment of her reported vaginal bleeding.   ____________________________ Robert Bellow, MD jwb:aw D: 10/21/2013 08:51:23 ET T:  10/21/2013 11:19:31 ET JOB#: [id number]  cc: Robert Bellow, MD, <Dictator> Guadalupe Maple, MD R. Barnett Applebaum, MD Burtis Junes, MD JEFFREY Amedeo Kinsman MD ELECTRONICALLY SIGNED 10/21/2013 13:36

## 2014-12-16 NOTE — Consult Note (Signed)
Brief Consult Note: Diagnosis: pelvic pain and elevated WBC, previous ovarian cysts.   Patient was seen by consultant.   Consult note dictated.   Recommend further assessment or treatment.   Orders entered.   Comments: [place] ordered earlier today but due to emergencies and not full bladder radiology postponed until tomorrow. Rec referral to Dr Kenton Kingfisher from her PCP as patient also having bleeding and would like hysterectomy. She states she has been havin g diarrhea off and on over last few months. Rec she stop wheat, and milk and keep diary of symptoms and then see GI for possible colitis or IBS. with bowel spasms.  Electronic Signatures: Erik Obey (MD)  (Signed [phone] 22:23)  Authored: Brief Consult Note   Last Updated: 24-Feb-15 22:23 by Erik Obey (MD)

## 2014-12-19 ENCOUNTER — Ambulatory Visit
Admit: 2014-12-19 | Disposition: A | Discharge status: Home / Self Care | Payer: Self-pay | Attending: Pain Medicine | Admitting: Pain Medicine

## 2014-12-27 ENCOUNTER — Ambulatory Visit: Admission: RE | Admit: 2014-12-27 | Payer: Medicaid Other | Source: Ambulatory Visit

## 2014-12-27 ENCOUNTER — Other Ambulatory Visit: Payer: Self-pay | Admitting: Unknown Physician Specialty

## 2014-12-27 ENCOUNTER — Ambulatory Visit
Admission: RE | Admit: 2014-12-27 | Discharge: 2014-12-27 | Disposition: A | Discharge status: Home / Self Care | Payer: Medicaid Other | Source: Ambulatory Visit | Attending: Unknown Physician Specialty | Admitting: Unknown Physician Specialty

## 2014-12-27 ENCOUNTER — Ambulatory Visit (HOSPITAL_COMMUNITY)
Admission: RE | Admit: 2014-12-27 | Discharge: 2014-12-27 | Disposition: A | Discharge status: Home / Self Care | Payer: Medicaid Other | Source: Ambulatory Visit | Attending: Unknown Physician Specialty | Admitting: Unknown Physician Specialty

## 2014-12-27 DIAGNOSIS — M25561 Pain in right knee: Secondary | ICD-10-CM

## 2014-12-29 ENCOUNTER — Ambulatory Visit
Admission: RE | Admit: 2014-12-29 | Discharge: 2014-12-29 | Disposition: A | Discharge status: Home / Self Care | Payer: Medicaid Other | Source: Ambulatory Visit | Attending: Unknown Physician Specialty | Admitting: Unknown Physician Specialty

## 2014-12-29 ENCOUNTER — Other Ambulatory Visit: Payer: Self-pay | Admitting: Unknown Physician Specialty

## 2014-12-29 DIAGNOSIS — M25561 Pain in right knee: Secondary | ICD-10-CM

## 2014-12-30 ENCOUNTER — Emergency Department: Payer: Medicaid Other

## 2014-12-30 ENCOUNTER — Emergency Department
Admission: EM | Admit: 2014-12-30 | Discharge: 2014-12-31 | Disposition: A | Discharge status: Home / Self Care | Payer: Medicaid Other | Attending: Emergency Medicine | Admitting: Emergency Medicine

## 2014-12-30 ENCOUNTER — Encounter: Payer: Self-pay | Admitting: Emergency Medicine

## 2014-12-30 DIAGNOSIS — Z79899 Other long term (current) drug therapy: Secondary | ICD-10-CM | POA: Diagnosis not present

## 2014-12-30 DIAGNOSIS — R630 Anorexia: Secondary | ICD-10-CM | POA: Insufficient documentation

## 2014-12-30 DIAGNOSIS — R102 Pelvic and perineal pain: Secondary | ICD-10-CM | POA: Diagnosis present

## 2014-12-30 DIAGNOSIS — R109 Unspecified abdominal pain: Secondary | ICD-10-CM

## 2014-12-30 DIAGNOSIS — N8320 Unspecified ovarian cysts: Secondary | ICD-10-CM | POA: Diagnosis not present

## 2014-12-30 DIAGNOSIS — I10 Essential (primary) hypertension: Secondary | ICD-10-CM | POA: Diagnosis not present

## 2014-12-30 DIAGNOSIS — N83201 Unspecified ovarian cyst, right side: Secondary | ICD-10-CM

## 2014-12-30 DIAGNOSIS — R197 Diarrhea, unspecified: Secondary | ICD-10-CM | POA: Diagnosis not present

## 2014-12-30 LAB — URINALYSIS COMPLETE WITH MICROSCOPIC (ARMC ONLY)
BILIRUBIN URINE: NEGATIVE
Bacteria, UA: NONE SEEN
Glucose, UA: NEGATIVE mg/dL
Hgb urine dipstick: NEGATIVE
Ketones, ur: NEGATIVE mg/dL
Nitrite: NEGATIVE
PH: 7 (ref 5.0–8.0)
Protein, ur: NEGATIVE mg/dL
Specific Gravity, Urine: 1.008 (ref 1.005–1.030)

## 2014-12-30 LAB — COMPREHENSIVE METABOLIC PANEL
ALK PHOS: 65 U/L (ref 38–126)
ALT: 19 U/L (ref 14–54)
ANION GAP: 6 (ref 5–15)
AST: 21 U/L (ref 15–41)
Albumin: 3.8 g/dL (ref 3.5–5.0)
BILIRUBIN TOTAL: 0.6 mg/dL (ref 0.3–1.2)
BUN: 10 mg/dL (ref 6–20)
CHLORIDE: 106 mmol/L (ref 101–111)
CO2: 26 mmol/L (ref 22–32)
Calcium: 8.6 mg/dL — ABNORMAL LOW (ref 8.9–10.3)
Creatinine, Ser: 0.78 mg/dL (ref 0.44–1.00)
GFR calc non Af Amer: >60 mL/min (ref >60)
GLUCOSE: 96 mg/dL (ref 65–99)
Potassium: 3.5 mmol/L (ref 3.5–5.1)
SODIUM: 138 mmol/L (ref 135–145)
Total Protein: 7.1 g/dL (ref 6.5–8.1)

## 2014-12-30 LAB — CBC WITH DIFFERENTIAL/PLATELET
Basophils Absolute: 0.1 10*3/uL (ref 0–0.1)
Basophils Relative: 1 %
Eosinophils Absolute: 0.2 10*3/uL (ref 0–0.7)
Eosinophils Relative: 2 %
HCT: 42.8 % (ref 35.0–47.0)
Hemoglobin: 14.2 g/dL (ref 12.0–16.0)
LYMPHS ABS: 3 10*3/uL (ref 1.0–3.6)
LYMPHS PCT: 35 %
MCH: 30.1 pg (ref 26.0–34.0)
MCHC: 33.2 g/dL (ref 32.0–36.0)
MCV: 90.5 fL (ref 80.0–100.0)
Monocytes Absolute: 0.5 10*3/uL (ref 0.2–0.9)
Monocytes Relative: 6 %
NEUTROS PCT: 56 %
Neutro Abs: 4.6 10*3/uL (ref 1.4–6.5)
PLATELETS: 294 10*3/uL (ref 150–440)
RBC: 4.73 MIL/uL (ref 3.80–5.20)
RDW: 13.7 % (ref 11.5–14.5)
WBC: 8.4 10*3/uL (ref 3.6–11.0)

## 2014-12-30 MED ORDER — SODIUM CHLORIDE 0.9 % IV BOLUS (SEPSIS)
1000.0000 mL | Freq: Once | INTRAVENOUS | Status: AC
  Administered 2014-12-30: 1000 mL via INTRAVENOUS

## 2014-12-30 MED ORDER — METOCLOPRAMIDE HCL 5 MG/ML IJ SOLN
INTRAMUSCULAR | Status: AC
  Filled 2014-12-30: qty 2

## 2014-12-30 MED ORDER — NAPROXEN 500 MG PO TABS: 500.0000 mg | ORAL_TABLET | Freq: Two times a day (BID) | ORAL | Status: DC

## 2014-12-30 MED ORDER — HYDROMORPHONE HCL 1 MG/ML IJ SOLN
1.0000 mg | Freq: Once | INTRAMUSCULAR | Status: AC
  Administered 2014-12-30: 1 mg via INTRAVENOUS

## 2014-12-30 MED ORDER — IOHEXOL 300 MG/ML  SOLN
100.0000 mL | Freq: Once | INTRAMUSCULAR | Status: AC | PRN
  Administered 2014-12-30: 100 mL via INTRAVENOUS

## 2014-12-30 MED ORDER — HYDROMORPHONE HCL 1 MG/ML IJ SOLN
INTRAMUSCULAR | Status: AC
Start: 2014-12-30 — End: 2014-12-30
  Filled 2014-12-30: qty 1

## 2014-12-30 MED ORDER — DIPHENHYDRAMINE HCL 50 MG/ML IJ SOLN
50.0000 mg | Freq: Once | INTRAMUSCULAR | Status: AC
  Administered 2014-12-30: 50 mg via INTRAVENOUS

## 2014-12-30 MED ORDER — DIPHENHYDRAMINE HCL 50 MG/ML IJ SOLN
INTRAMUSCULAR | Status: AC
  Administered 2014-12-30: 50 mg via INTRAVENOUS
  Filled 2014-12-30: qty 1

## 2014-12-30 MED ORDER — IOHEXOL 240 MG/ML SOLN
25.0000 mL | Freq: Once | INTRAMUSCULAR | Status: AC | PRN
  Administered 2014-12-30: 25 mL via ORAL

## 2014-12-30 MED ORDER — METOCLOPRAMIDE HCL 5 MG/ML IJ SOLN
10.0000 mg | Freq: Once | INTRAMUSCULAR | Status: AC
  Administered 2014-12-30: 10 mg via INTRAVENOUS

## 2014-12-30 NOTE — Discharge Instructions (Signed)
Abdominal Pain, Women °Abdominal (stomach, pelvic, or belly) pain can be caused by many things. It is important to tell your doctor: °· The location of the pain. °· Does it come and go or is it present all the time? °· Are there things that start the pain (eating certain foods, exercise)? °· Are there other symptoms associated with the pain (fever, nausea, vomiting, diarrhea)? °All of this is helpful to know when trying to find the cause of the pain. °CAUSES  °· Stomach: virus or bacteria infection, or ulcer. °· Intestine: appendicitis (inflamed appendix), regional ileitis (Crohn's disease), ulcerative colitis (inflamed colon), irritable bowel syndrome, diverticulitis (inflamed diverticulum of the colon), or cancer of the stomach or intestine. °· Gallbladder disease or stones in the gallbladder. °· Kidney disease, kidney stones, or infection. °· Pancreas infection or cancer. °· Fibromyalgia (pain disorder). °· Diseases of the female organs: °· Uterus: fibroid (non-cancerous) tumors or infection. °· Fallopian tubes: infection or tubal pregnancy. °· Ovary: cysts or tumors. °· Pelvic adhesions (scar tissue). °· Endometriosis (uterus lining tissue growing in the pelvis and on the pelvic organs). °· Pelvic congestion syndrome (female organs filling up with blood just before the menstrual period). °· Pain with the menstrual period. °· Pain with ovulation (producing an egg). °· Pain with an IUD (intrauterine device, birth control) in the uterus. °· Cancer of the female organs. °· Functional pain (pain not caused by a disease, may improve without treatment). °· Psychological pain. °· Depression. °DIAGNOSIS  °Your doctor will decide the seriousness of your pain by doing an examination. °· Blood tests. °· X-rays. °· Ultrasound. °· CT scan (computed tomography, special type of X-ray). °· MRI (magnetic resonance imaging). °· Cultures, for infection. °· Barium enema (dye inserted in the large intestine, to better view it with  X-rays). °· Colonoscopy (looking in intestine with a lighted tube). °· Laparoscopy (minor surgery, looking in abdomen with a lighted tube). °· Major abdominal exploratory surgery (looking in abdomen with a large incision). °TREATMENT  °The treatment will depend on the cause of the pain.  °· Many cases can be observed and treated at home. °· Over-the-counter medicines recommended by your caregiver. °· Prescription medicine. °· Antibiotics, for infection. °· Birth control pills, for painful periods or for ovulation pain. °· Hormone treatment, for endometriosis. °· Nerve blocking injections. °· Physical therapy. °· Antidepressants. °· Counseling with a psychologist or psychiatrist. °· Minor or major surgery. °HOME CARE INSTRUCTIONS  °· Do not take laxatives, unless directed by your caregiver. °· Take over-the-counter pain medicine only if ordered by your caregiver. Do not take aspirin because it can cause an upset stomach or bleeding. °· Try a clear liquid diet (broth or water) as ordered by your caregiver. Slowly move to a bland diet, as tolerated, if the pain is related to the stomach or intestine. °· Have a thermometer and take your temperature several times a day, and record it. °· Bed rest and sleep, if it helps the pain. °· Avoid sexual intercourse, if it causes pain. °· Avoid stressful situations. °· Keep your follow-up appointments and tests, as your caregiver orders. °· If the pain does not go away with medicine or surgery, you may try: °· Acupuncture. °· Relaxation exercises (yoga, meditation). °· Group therapy. °· Counseling. °SEEK MEDICAL CARE IF:  °· You notice certain foods cause stomach pain. °· Your home care treatment is not helping your pain. °· You need stronger pain medicine. °· You want your IUD removed. °· You feel faint or   not go away with medicine or surgery, you may try:   Acupuncture.   Relaxation exercises (yoga, meditation).   Group therapy.   Counseling.  SEEK MEDICAL CARE IF:    You notice certain foods cause stomach pain.   Your home care treatment is not helping your pain.   You need stronger pain medicine.   You want your IUD removed.   You feel faint or lightheaded.   You develop nausea and vomiting.   You develop a rash.   You are having side effects or an allergy to your medicine.  SEEK IMMEDIATE MEDICAL CARE IF:    Your  pain does not go away or gets worse.   You have a fever.   Your pain is felt only in portions of the abdomen. The right side could possibly be appendicitis. The left lower portion of the abdomen could be colitis or diverticulitis.   You are passing blood in your stools (bright red or black tarry stools, with or without vomiting).   You have blood in your urine.   You develop chills, with or without a fever.   You pass out.  MAKE SURE YOU:    Understand these instructions.   Will watch your condition.   Will get help right away if you are not doing well or get worse.  Document Released: 06/08/2007 Document Revised: 12/26/2013 Document Reviewed: 06/28/2009  [employer] Patient Information 2015 [employer]. This information is not intended to replace advice given to you by your health care provider. Make sure you discuss any questions you have with your health care provider.          Ovarian Cyst  An ovarian cyst is a fluid-filled sac that forms on an ovary. The ovaries are small organs that produce eggs in women. Various types of cysts can form on the ovaries. Most are not cancerous. Many do not cause problems, and they often go away on their own. Some may cause symptoms and require treatment. Common types of ovarian cysts include:   Functional cysts--These cysts may occur every month during the menstrual cycle. This is normal. The cysts usually go away with the next menstrual cycle if the woman does not get pregnant. Usually, there are no symptoms with a functional cyst.   Endometrioma cysts--These cysts form from the tissue that lines the uterus. They are also called "chocolate cysts" because they become filled with blood that turns brown. This type of cyst can cause pain in the lower abdomen during intercourse and with your menstrual period.   Cystadenoma cysts--This type develops from the cells on the outside of the ovary. These cysts can get very big and cause lower abdomen pain and pain with  intercourse. This type of cyst can twist on itself, cut off its blood supply, and cause severe pain. It can also easily rupture and cause a lot of pain.   Dermoid cysts--This type of cyst is sometimes found in both ovaries. These cysts may contain different kinds of body tissue, such as skin, teeth, hair, or cartilage. They usually do not cause symptoms unless they get very big.   Theca lutein cysts--These cysts occur when too much of a certain hormone (human chorionic gonadotropin) is produced and overstimulates the ovaries to produce an egg. This is most common after procedures used to assist with the conception of a baby (in vitro fertilization).  CAUSES    Fertility drugs can cause a condition in which multiple large cysts are formed on the   more about the cyst. These tests may include:  Ultrasound.  X-ray of the pelvis.  CT scan.  MRI.  Blood tests. TREATMENT  Many ovarian cysts go away on their own without treatment. Your health care provider may want to check your cyst regularly for 2-3 months to see if it changes. For women in menopause, it is particularly important to monitor a cyst closely because of the higher rate of ovarian cancer in menopausal women. When treatment is needed, it may include any of the following:  A procedure to  drain the cyst (aspiration). This may be done using a long needle and ultrasound. It can also be done through a laparoscopic procedure. This involves using a thin, lighted tube with a tiny camera on the end (laparoscope) inserted through a small incision.  Surgery to remove the whole cyst. This may be done using laparoscopic surgery or an open surgery involving a larger incision in the lower abdomen.  Hormone treatment or birth control pills. These methods are sometimes used to help dissolve a cyst. HOME CARE INSTRUCTIONS   Only take over-the-counter or prescription medicines as directed by your health care provider.  Follow up with your health care provider as directed.  Get regular pelvic exams and Pap tests. SEEK MEDICAL CARE IF:   Your periods are late, irregular, or painful, or they stop.  Your pelvic pain or abdominal pain does not go away.  Your abdomen becomes larger or swollen.  You have pressure on your bladder or trouble emptying your bladder completely.  You have pain during sexual intercourse.  You have feelings of fullness, pressure, or discomfort in your stomach.  You lose weight for no apparent reason.  You feel generally ill.  You become constipated.  You lose your appetite.  You develop acne.  You have an increase in body and facial hair.  You are gaining weight, without changing your exercise and eating habits.  You think you are pregnant. SEEK IMMEDIATE MEDICAL CARE IF:   You have increasing abdominal pain.  You feel sick to your stomach (nauseous), and you throw up (vomit).  You develop a fever that comes on suddenly.  You have abdominal pain during a bowel movement.  Your menstrual periods become heavier than usual. MAKE SURE YOU:  Understand these instructions.  Will watch your condition.  Will get help right away if you are not doing well or get worse. Document Released: 08/11/2005 Document Revised: 08/16/2013 Document Reviewed:  04/18/2013 [facility] Patient Information 2015 [place], [place]. This information is not intended to replace advice given to you by your health care provider. Make sure you discuss any questions you have with your health care provider.   Your blood tests and CT scan of the abdomen and pelvis today are unremarkable, except that the CT scan shows a 4 cm cyst on the right ovary. Aside from causing severe pain, this does not appear to be causing a serious illness at this time. Take pain medicine as needed, and follow up with gynecology for further monitoring of your symptoms.

## 2014-12-30 NOTE — ED Notes (Signed)
Pt reports that she does not have a ride home and cannot call anyone to come pick her up. Explained to pt that because she received IV narcotics, she will have to remain in ED for 4-6 hours prior to driving home. Pt verbalized understanding. Pt moved to 1H.

## 2014-12-30 NOTE — ED Notes (Signed)
Pt to CT at this time.

## 2014-12-30 NOTE — ED Provider Notes (Signed)
[facility] Emergency Department Provider Note  ____________________________________________  Time seen: 8:20 PM  I have reviewed the triage vital signs and the nursing notes.   HISTORY  Chief Complaint Pelvic Pain and Abdominal Pain    HPI Claire Mcdonald is a 49 y.o. female who complains of 3 days of lower abdominal pain, worse on the right lower quadrant. She has nausea and decreased appetite but is able to tolerate by mouth. No vomiting but she did have diarrhea yesterday. No dysuria, vaginal bleeding, vaginal discharge. She reports that she has not been sexually active in a very long time. No recent trauma, no chest pain or shortness of breath. No syncope. The abdominal pain is right lower quadrant, stabbing, nonradiating, not worsened with movement but worse with pressing in the area. No other aggravating or alleviating factors. No other associated symptoms other than noted above. It is moderate in intensity at this time. It's been constant for 3 days. Worsening.     Past Medical History  Diagnosis Date  . Hypertension   . High cholesterol   . Anxiety   . Depression     There are no active problems to display for this patient.   Past Surgical History  Procedure Laterality Date  . Tubal ligation  1998    Current Outpatient Rx  Name  Route  Sig  Dispense  Refill  . acetaminophen (TYLENOL) 500 MG tablet   Oral   Take 500 mg by mouth every 6 (six) hours as needed for pain.         . DULoxetine (CYMBALTA) 60 MG capsule   Oral   Take 60 mg by mouth daily.         . furosemide (LASIX) 40 MG tablet   Oral   Take 40 mg by mouth daily.         Marland Kitchen gabapentin (NEURONTIN) 300 MG capsule   Oral   Take 300 mg by mouth daily.         Marland Kitchen HYDROcodone-acetaminophen (NORCO/VICODIN) 5-325 MG per tablet   Oral   Take 2 tablets by mouth every 4 (four) hours as needed for pain.   10 tablet   0   . metoprolol succinate (TOPROL-XL) 100 MG 24 hr  tablet   Oral   Take 100 mg by mouth daily. Take with or immediately following a meal.         . naproxen (NAPROSYN) 500 MG tablet   Oral   Take 1 tablet (500 mg total) by mouth 2 (two) times daily with a meal.   20 tablet   0   . promethazine (PHENERGAN) 25 MG tablet   Oral   Take 1 tablet (25 mg total) by mouth every 6 (six) hours as needed for nausea.   20 tablet   0   . topiramate (TOPAMAX) 100 MG tablet   Oral   Take 100 mg by mouth 2 (two) times daily.           Allergies Toradol; Tramadol; and Zofran  Family History  Problem Relation Age of Onset  . Hypertension Father   . Diabetes Father   . Hyperlipidemia Neg Hx   . Heart disease Neg Hx   . Stroke Neg Hx   . Cancer Neg Hx     Social History History  Substance Use Topics  . Smoking status: Never Smoker   . Smokeless tobacco: Never Used  . Alcohol Use: No    Review of Systems  Constitutional: No fever or chills. No weight changes Eyes:No blurry vision or double vision.  ENT: No sore throat. Cardiovascular: No chest pain. Respiratory: No dyspnea or cough. Gastrointestinal: As per history of present illness  No BRBPR or melena. Genitourinary: Negative for dysuria, urinary retention, bloody urine, or difficulty urinating. Musculoskeletal: Negative for back pain. No joint swelling or pain. Skin: Negative for rash. Neurological: Negative for headaches, focal weakness or numbness. Psychiatric:No anxiety or depression.   Endocrine:No hot/cold intolerance, changes in energy, or sleep difficulty.  10-point ROS otherwise negative.  ____________________________________________   PHYSICAL EXAM:  VITAL SIGNS: ED Triage Vitals  Enc Vitals Group     BP 12/30/14 1644 148/93 mmHg     Pulse Rate 12/30/14 1644 63     Resp 12/30/14 1644 18     Temp 12/30/14 1644 98.2 F (36.8 C)     Temp Source 12/30/14 1644 Oral     SpO2 12/30/14 1644 98 %     Weight 12/30/14 1644 220 lb (99.791 kg)     Height  12/30/14 1644 5\' 4"  (1.626 m)     Head Cir --      Peak Flow --      Pain Score 12/30/14 1648 10     Pain Loc --      Pain Edu? --      Excl. in [place]? --      Constitutional: Alert and oriented. Well appearing and in no distress. Eyes: No scleral icterus. No conjunctival pallor. PERRL. EOMI ENT   Head: Normocephalic and atraumatic.   Nose: No congestion/rhinnorhea. No septal hematoma   Mouth/Throat: MMM, no pharyngeal erythema   Neck: No stridor. No SubQ emphysema.  Hematological/Lymphatic/Immunilogical: No cervical lymphadenopathy. Cardiovascular: RRR. Normal and symmetric distal pulses are present in all extremities. No murmurs, rubs, or gallops. Respiratory: Normal respiratory effort without tachypnea nor retractions. Breath sounds are clear and equal bilaterally. No wheezes/rales/rhonchi. Gastrointestinal: Right lower quadrant tenderness at McBurney's point.. No distention. There is no CVA tenderness.  No rebound, rigidity, or guarding. Genitourinary: deferred Musculoskeletal: Nontender with normal range of motion in all extremities. No joint effusions.  No lower extremity tenderness.  No edema. Neurologic:   Normal speech and language.  CN 2-10 normal. Motor grossly intact. No pronator drift.  Normal gait. No gross focal neurologic deficits are appreciated.  Skin:  Skin is warm, dry and intact. No rash noted.  No petechiae, purpura, or bullae. Psychiatric: Mood and affect are normal. Speech and behavior are normal. Patient exhibits appropriate insight and judgment.  ____________________________________________    LABS (pertinent positives/negatives) (all labs ordered are listed, but only abnormal results are displayed) Labs Reviewed  URINALYSIS COMPLETEWITH MICROSCOPIC ([place])  - Abnormal; Notable for the following:    Color, Urine STRAW (*)    APPearance HAZY (*)    Leukocytes, UA TRACE (*)    Squamous Epithelial / LPF 6-30 (*)    All other components  within normal limits  COMPREHENSIVE METABOLIC PANEL - Abnormal; Notable for the following:    Calcium 8.6 (*)    All other components within normal limits  CBC WITH DIFFERENTIAL/PLATELET  URINALYSIS, ROUTINE W REFLEX MICROSCOPIC   ____________________________________________   EKG    ____________________________________________    RADIOLOGY  CT abdomen and pelvis reveals a 4 cm right ovarian cyst  ____________________________________________   PROCEDURES  ____________________________________________   INITIAL IMPRESSION / ASSESSMENT AND PLAN / ED COURSE  Pertinent labs & imaging results that were available during my care of the  patient were reviewed by me and considered in my medical decision making (see chart for details).  Patient presents with symptoms and exam concerning for appendicitis. It was suspicion of torsion, PID, ectopic pregnancy. Do not suspect an acute abdomen at this time, and the patient is not septic. We'll get labs and a CT scan of the abdomen and pelvis, give her IV fluids, Dilaudid, and Reglan for nausea. If the CT scan is unremarkable, think she can be discharged home with treatment for symptomatic fibroids that she does have a history of similar pain in the past. Her last menstrual period was 3 weeks ago so this is a reasonable time to be having pain associated with fibroids. ----------------------------------------- 12:12 AM on 12/31/2014 -----------------------------------------  Medically stable. Workup unremarkable except for symptomatic ovarian cyst. We will discharge her home to follow-up with gynecology.  ____________________________________________   FINAL CLINICAL IMPRESSION(S) / ED DIAGNOSES  Final diagnoses:  Cyst of right ovary  Abdominal pain in female      Carrie Mew, MD 12/31/14 [phone]

## 2014-12-30 NOTE — ED Notes (Signed)
Patient to ED with c/o pelvic and lower abdominal pain for 3 days, denies urinary symptoms.

## 2014-12-30 NOTE — ED Provider Notes (Signed)
[facility] Emergency Department Provider Note  ____________________________________________  Time seen: 37  I have reviewed the triage vital signs and the nursing notes.   HISTORY  Chief Complaint Pelvic Pain and Abdominal Pain    HPI Claire Mcdonald is a 49 y.o. female who presents with 2 days of lower abdominal pain. The pain is sharp and crampy, radiating to her lateral back. She reports having similar pain in the past that was related to uterine fibroids. Her last menstrual cycle was 3 weeks ago. She reports nausea but no vomiting or diarrhea, and no fever or chills. No syncope or dizziness or lightheadedness. The pain is been constant over the last 2 days with a waxing and waning course.     Past Medical History  Diagnosis Date  . Hypertension   . High cholesterol   . Anxiety   . Depression     There are no active problems to display for this patient.   Past Surgical History  Procedure Laterality Date  . Tubal ligation  1998    Current Outpatient Rx  Name  Route  Sig  Dispense  Refill  . acetaminophen (TYLENOL) 500 MG tablet   Oral   Take 500 mg by mouth every 6 (six) hours as needed for pain.         . DULoxetine (CYMBALTA) 60 MG capsule   Oral   Take 60 mg by mouth daily.         . furosemide (LASIX) 40 MG tablet   Oral   Take 40 mg by mouth daily.         Marland Kitchen gabapentin (NEURONTIN) 300 MG capsule   Oral   Take 300 mg by mouth daily.         Marland Kitchen HYDROcodone-acetaminophen (NORCO/VICODIN) 5-325 MG per tablet   Oral   Take 2 tablets by mouth every 4 (four) hours as needed for pain.   10 tablet   0   . metoprolol succinate (TOPROL-XL) 100 MG 24 hr tablet   Oral   Take 100 mg by mouth daily. Take with or immediately following a meal.         . naproxen (NAPROSYN) 500 MG tablet   Oral   Take 1 tablet (500 mg total) by mouth 2 (two) times daily with a meal.   20 tablet   0   . promethazine (PHENERGAN) 25 MG tablet    Oral   Take 1 tablet (25 mg total) by mouth every 6 (six) hours as needed for nausea.   20 tablet   0   . topiramate (TOPAMAX) 100 MG tablet   Oral   Take 100 mg by mouth 2 (two) times daily.           Allergies Toradol; Tramadol; and Zofran  Family History  Problem Relation Age of Onset  . Hypertension Father   . Diabetes Father   . Hyperlipidemia Neg Hx   . Heart disease Neg Hx   . Stroke Neg Hx   . Cancer Neg Hx     Social History History  Substance Use Topics  . Smoking status: Never Smoker   . Smokeless tobacco: Never Used  . Alcohol Use: No    Review of Systems  Constitutional: No fever or chills. No weight changes Eyes:No blurry vision or double vision.  ENT: No sore throat. Cardiovascular: No chest pain. Respiratory: No dyspnea or cough. Gastrointestinal: Per history of present illness.  No BRBPR or melena. Genitourinary:  Negative for dysuria, urinary retention, bloody urine, or difficulty urinating. Musculoskeletal: Negative for back pain. No joint swelling or pain. Skin: Negative for rash. Neurological: Negative for headaches, focal weakness or numbness. Psychiatric:No anxiety or depression.   Endocrine:No hot/cold intolerance, changes in energy, or sleep difficulty.  10-point ROS otherwise negative.  ____________________________________________   PHYSICAL EXAM:  VITAL SIGNS: ED Triage Vitals  Enc Vitals Group     BP 12/30/14 1644 148/93 mmHg     Pulse Rate 12/30/14 1644 63     Resp 12/30/14 1644 18     Temp 12/30/14 1644 98.2 F (36.8 C)     Temp Source 12/30/14 1644 Oral     SpO2 12/30/14 1644 98 %     Weight 12/30/14 1644 220 lb (99.791 kg)     Height 12/30/14 1644 5\' 4"  (1.626 m)     Head Cir --      Peak Flow --      Pain Score 12/30/14 1648 10     Pain Loc --      Pain Edu? --      Excl. in [place]? --      Constitutional: Alert and oriented. Well appearing and in no distress. Eyes: No scleral icterus. No conjunctival pallor.  PERRL. EOMI ENT   Head: Normocephalic and atraumatic.   Nose: No congestion/rhinnorhea. No septal hematoma   Mouth/Throat: MMM, no pharyngeal erythema   Neck: No stridor. No SubQ emphysema.  Hematological/Lymphatic/Immunilogical: No cervical lymphadenopathy. Cardiovascular: RRR. Normal and symmetric distal pulses are present in all extremities. No murmurs, rubs, or gallops. Respiratory: Normal respiratory effort without tachypnea nor retractions. Breath sounds are clear and equal bilaterally. No wheezes/rales/rhonchi. Gastrointestinal: Right lower quadrant tenderness. No distention. There is no CVA tenderness.  No rebound, rigidity, or guarding. Genitourinary: deferred Musculoskeletal: Nontender with normal range of motion in all extremities. No joint effusions.  No lower extremity tenderness.  No edema. Neurologic:   Normal speech and language.  CN 2-10 normal. Motor grossly intact. No pronator drift.  Normal gait. No gross focal neurologic deficits are appreciated.  Skin:  Skin is warm, dry and intact. No rash noted.  No petechiae, purpura, or bullae. Psychiatric: Mood and affect are normal. Speech and behavior are normal. Patient exhibits appropriate insight and judgment.  ____________________________________________    LABS (pertinent positives/negatives) (all labs ordered are listed, but only abnormal results are displayed) Labs Reviewed  URINALYSIS COMPLETEWITH MICROSCOPIC ([place])  - Abnormal; Notable for the following:    Color, Urine STRAW (*)    APPearance HAZY (*)    Leukocytes, UA TRACE (*)    Squamous Epithelial / LPF 6-30 (*)    All other components within normal limits  COMPREHENSIVE METABOLIC PANEL - Abnormal; Notable for the following:    Calcium 8.6 (*)    All other components within normal limits  CBC WITH DIFFERENTIAL/PLATELET  URINALYSIS, ROUTINE W REFLEX MICROSCOPIC    ____________________________________________   EKG    ____________________________________________    RADIOLOGY  CT abdomen and pelvis significant for 4 cm cyst on the right ovary. No free fluid, no further imaging needed per radiology recommendations.  ____________________________________________   PROCEDURES  ____________________________________________   INITIAL IMPRESSION / ASSESSMENT AND PLAN / ED COURSE  Pertinent labs & imaging results that were available during my care of the patient were reviewed by me and considered in my medical decision making (see chart for details).  The patient presents with possible appendicitis versus ruptured cyst. I have a low suspicion for  torsion, PID, ectopic pregnancy at this time. We will obtain blood work and a CT scan of the abdomen and pelvis, give her IV fluids and Dilaudid and Zofran, and reevaluate.  ----------------------------------------- 11:10 PM on 12/30/2014 ----------------------------------------- Abdominal pain is improved. Tenderness is improved. Workup is unremarkable except for a simple simple ovarian cyst. She is medically stable, symptoms are controlled, we'll discharge her home and have her follow-up with her primary care doctor and gynecologist needed. I'll prescribe naproxen for her symptomatic ovarian cyst.   ____________________________________________   FINAL CLINICAL IMPRESSION(S) / ED DIAGNOSES  Final diagnoses:  Cyst of right ovary  Abdominal pain in female      Carrie Mew, MD 12/30/14 2311

## 2015-01-01 ENCOUNTER — Encounter: Payer: Self-pay | Admitting: Pain Medicine

## 2015-01-01 ENCOUNTER — Ambulatory Visit: Payer: Medicaid Other | Attending: Pain Medicine | Admitting: Pain Medicine

## 2015-01-01 VITALS — BP 108/74 | HR 58 | Temp 97.9°F | Resp 16 | Ht 64.0 in | Wt 220.0 lb

## 2015-01-01 DIAGNOSIS — M533 Sacrococcygeal disorders, not elsewhere classified: Secondary | ICD-10-CM | POA: Insufficient documentation

## 2015-01-01 DIAGNOSIS — M51379 Other intervertebral disc degeneration, lumbosacral region without mention of lumbar back pain or lower extremity pain: Secondary | ICD-10-CM | POA: Insufficient documentation

## 2015-01-01 DIAGNOSIS — M5137 Other intervertebral disc degeneration, lumbosacral region: Secondary | ICD-10-CM | POA: Insufficient documentation

## 2015-01-01 DIAGNOSIS — M1990 Unspecified osteoarthritis, unspecified site: Secondary | ICD-10-CM | POA: Insufficient documentation

## 2015-01-01 DIAGNOSIS — M545 Low back pain: Secondary | ICD-10-CM | POA: Diagnosis present

## 2015-01-01 DIAGNOSIS — M4328 Fusion of spine, sacral and sacrococcygeal region: Secondary | ICD-10-CM | POA: Diagnosis not present

## 2015-01-01 DIAGNOSIS — M4316 Spondylolisthesis, lumbar region: Secondary | ICD-10-CM | POA: Diagnosis not present

## 2015-01-01 MED ORDER — MIDAZOLAM HCL 5 MG/5ML IJ SOLN
INTRAMUSCULAR | Status: AC
  Administered 2015-01-01: 4 mg via INTRAVENOUS
  Filled 2015-01-01: qty 5

## 2015-01-01 MED ORDER — FENTANYL CITRATE (PF) 100 MCG/2ML IJ SOLN
INTRAMUSCULAR | Status: AC
  Administered 2015-01-01: 100 ug via INTRAVENOUS
  Filled 2015-01-01: qty 2

## 2015-01-01 MED ORDER — TRIAMCINOLONE ACETONIDE 40 MG/ML IJ SUSP
INTRAMUSCULAR | Status: AC
  Administered 2015-01-01: 10 mg
  Filled 2015-01-01: qty 1

## 2015-01-01 MED ORDER — ORPHENADRINE CITRATE 30 MG/ML IJ SOLN
INTRAMUSCULAR | Status: AC
  Administered 2015-01-01: 60 mg via INTRAMUSCULAR
  Filled 2015-01-01: qty 2

## 2015-01-01 MED ORDER — BUPIVACAINE HCL (PF) 0.25 % IJ SOLN
INTRAMUSCULAR | Status: AC
  Administered 2015-01-01: 20 mL
  Filled 2015-01-01: qty 30

## 2015-01-01 NOTE — Progress Notes (Signed)
Discharge patient home via wheelchair on [place] bus accompanied by son at 1203 hrs Teach back 3

## 2015-01-01 NOTE — Procedures (Signed)
PROCEDURE:  Block of nerves to the sacroiliac joint.   NOTE:  The patient is a 49 y.o. year-old female who returns to the [facility] for further evaluation and treatment of pain involving the lower back and lower extremity region with pain in the region of the buttocks as well. 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 Patient with severe tenderness of the PSIS and PIIS regions and positive Patrick's maneuver appears to be with significant sacroiliac joint dysfunction .  The risks, benefits, expectations of the procedure have been discussed and explained to the patient who is understanding and willing to proceed with interventional treatment in attempt to decrease severity of patient's symptoms, minimize the risk of medication escalation and  hopefully retard the progression of the patient's symptoms. We will proceed with what is felt to be a medically necessary procedure, block of nerves to the sacroiliac joint.   DESCRIPTION OF PROCEDURE:  Block of nerves to the sacroiliac joint.   The patient was taken to the fluoroscopy suite. With the patient in the prone position with EKG, blood pressure, pulse and pulse oximetry monitoring, IV Versed, IV fentanyl conscious sedation, Betadine prep of proposed entry site was performed.   Block of nerves at the L5 vertebral body level.   With the patient in prone position, under fluoroscopic guidance, a 25-gauge needle was inserted at the L5 vertebral body level on the left side. With 15 degrees oblique orientation a 25-gauge needle was inserted in the region known as Burton's eye or eye of the Scotty dog. Following documentation of needle placement in the area of Burton's eye or eye of the Scotty dog under  fluoroscopic guidance, needle placement was then accomplished at the sacral ala level on the left side.   Needle placement at the sacral ala.   With the patient in prone position under fluoroscopic guidance with AP view of the lumbosacral spine, a 25-gauge needle was inserted in the region known as the sacral ala on the left side. Following documentation of needle placement on the left side under fluoroscopic guidance needle placement was then accomplished at the S1 foramen level.   Needle placement at the S1 foramen level.   With the patient in prone position under fluoroscopic guidance with AP view of the lumbosacral spine and cephalad orientation, a 25-gauge needle was inserted at the superior and lateral border of the S1 foramen on the left side. Following documentation of needle placement at the S1 foramen level on the left side, needle placement was then accomplished at the S2 foramen level on the left side.   Needle placement at the S2 foramen level.   With the patient in prone position with AP view of the lumbosacral spine with cephalad orientation, a 25-gauge needle was inserted at the superior and lateral border of the S2 foramen under fluoroscopic guidance on the left side. Following needle placement at the L5 vertebral body level, sacral ala, S1 foramen and S2 foramen on the left side, needle placement was verified on lateral view under fluoroscopic guidance.  Following needle placement documentation on lateral view, each needle was injected with 1 mL of 0.25% bupivacaine and Kenalog. A total of 10 mg of Kenalog was utilized for the procedure. Please repeat procedure on the right side as  performed on the left side at the L5 vertebral body level, sacral ala, S1 foramen, S2 foramen,.  Now I would like to repeat this procedure on the right side, so you would then type this again with the right side being the side which you would transcribe. This procedure is usually done on the left side and  the  right side. So, I would like for you to type the right side just as I dictated the left side.   PLAN:  1. Medications: The patient will continue presently prescribed medications.  2. The patient will be considered for modification of treatment regimen pending response to the procedure performed on today's visit.  3. The patient is to follow-up with primary care physician Dr. Jeananne Rama for evaluation of blood pressure and general medical condition following the procedure performed on today's visit.  4. Surgical evaluation as discussed.  5. Neurological evaluation as discussed.  6. The patient may be a candidate for radiofrequency procedures, implantation devices and other treatment pending response to treatment performed on today's visit and follow-up evaluation.  7. The patient has been advised to adhere to proper body mechanics and to avoid activities which may exacerbate the patient's symptoms.   Return appointment to [facility] as scheduled.

## 2015-01-01 NOTE — Progress Notes (Signed)
LBP LE PAIN  PAIN ACROSS HIPS AND BUTTOCKS  

## 2015-01-01 NOTE — Patient Instructions (Addendum)
Continue present medications.  F/U PCP for evaliation of  BP and general medical  Condition.  F/U surgical evaluation.  F/U nrurological evaluation.  May consider radiofrequency rhizolysis or intraspinal procedures pending response to present treatment and F/U evaluation.  Patient to call [facility] should patient have concerns prior to scheduled return appointment.Pain Management Discharge Instructions  General Discharge Instructions :  If you need to reach your doctor call: Monday-Friday 8:00 am - 4:00 pm at [phone] or toll free [phone].  After clinic hours [phone] to have operator reach doctor.  Bring all of your medication bottles to all your appointments in the pain clinic.  To cancel or reschedule your appointment with Pain Management please remember to call 24 hours in advance to avoid a fee.  Refer to the educational materials which you have been given on: General Risks, I had my Procedure. Discharge Instructions, Post Sedation.  Post Procedure Instructions:  The drugs you were given will stay in your system until tomorrow, so for the next 24 hours you should not drive, make any legal decisions or drink any alcoholic beverages.  You may eat anything you prefer, but it is better to start with liquids then soups and crackers, and gradually work up to solid foods.  Please notify your doctor immediately if you have any unusual bleeding, trouble breathing or pain that is not related to your normal pain.  Depending on the type of procedure that was done, some parts of your body may feel week and/or numb.  This usually clears up by tonight or the next day.  Walk with the use of an assistive device or accompanied by an adult for the 24 hours.  You may use ice on the affected area for the first 24 hours.  Put ice in a Ziploc bag and cover with a towel and place against area 15 minutes on 15 minutes off.  You may switch to heat after 24 hours.

## 2015-01-02 ENCOUNTER — Telehealth: Payer: Self-pay | Admitting: *Deleted

## 2015-01-02 NOTE — Telephone Encounter (Signed)
Post procedure call 

## 2015-01-23 ENCOUNTER — Emergency Department: Payer: Medicaid Other

## 2015-01-23 ENCOUNTER — Other Ambulatory Visit: Payer: Self-pay

## 2015-01-23 ENCOUNTER — Encounter: Payer: Self-pay | Admitting: Emergency Medicine

## 2015-01-23 ENCOUNTER — Emergency Department
Admission: EM | Admit: 2015-01-23 | Discharge: 2015-01-23 | Disposition: A | Discharge status: Home / Self Care | Payer: Medicaid Other | Attending: Emergency Medicine | Admitting: Emergency Medicine

## 2015-01-23 DIAGNOSIS — R1013 Epigastric pain: Secondary | ICD-10-CM | POA: Diagnosis present

## 2015-01-23 DIAGNOSIS — J069 Acute upper respiratory infection, unspecified: Secondary | ICD-10-CM

## 2015-01-23 DIAGNOSIS — Z79899 Other long term (current) drug therapy: Secondary | ICD-10-CM | POA: Insufficient documentation

## 2015-01-23 DIAGNOSIS — Z7951 Long term (current) use of inhaled steroids: Secondary | ICD-10-CM | POA: Diagnosis not present

## 2015-01-23 DIAGNOSIS — K219 Gastro-esophageal reflux disease without esophagitis: Secondary | ICD-10-CM | POA: Diagnosis not present

## 2015-01-23 DIAGNOSIS — J029 Acute pharyngitis, unspecified: Secondary | ICD-10-CM | POA: Insufficient documentation

## 2015-01-23 DIAGNOSIS — I10 Essential (primary) hypertension: Secondary | ICD-10-CM | POA: Diagnosis not present

## 2015-01-23 DIAGNOSIS — Z791 Long term (current) use of non-steroidal anti-inflammatories (NSAID): Secondary | ICD-10-CM | POA: Insufficient documentation

## 2015-01-23 DIAGNOSIS — Z3202 Encounter for pregnancy test, result negative: Secondary | ICD-10-CM | POA: Insufficient documentation

## 2015-01-23 LAB — URINALYSIS COMPLETE WITH MICROSCOPIC (ARMC ONLY)
BACTERIA UA: NONE SEEN
BILIRUBIN URINE: NEGATIVE
GLUCOSE, UA: NEGATIVE mg/dL
Hgb urine dipstick: NEGATIVE
Ketones, ur: NEGATIVE mg/dL
LEUKOCYTES UA: NEGATIVE
Nitrite: NEGATIVE
Protein, ur: NEGATIVE mg/dL
Specific Gravity, Urine: 1.018 (ref 1.005–1.030)
pH: 6 (ref 5.0–8.0)

## 2015-01-23 LAB — CBC WITH DIFFERENTIAL/PLATELET
BASOS ABS: 0 10*3/uL (ref 0–0.1)
Basophils Relative: 0 %
EOS ABS: 0.2 10*3/uL (ref 0–0.7)
Eosinophils Relative: 2 %
HCT: 44 % (ref 35.0–47.0)
Hemoglobin: 14.4 g/dL (ref 12.0–16.0)
Lymphocytes Relative: 25 %
Lymphs Abs: 2.8 10*3/uL (ref 1.0–3.6)
MCH: 30.2 pg (ref 26.0–34.0)
MCHC: 32.8 g/dL (ref 32.0–36.0)
MCV: 92 fL (ref 80.0–100.0)
MONOS PCT: 6 %
Monocytes Absolute: 0.7 10*3/uL (ref 0.2–0.9)
NEUTROS ABS: 7.6 10*3/uL — AB (ref 1.4–6.5)
Neutrophils Relative %: 67 %
PLATELETS: 338 10*3/uL (ref 150–440)
RBC: 4.78 MIL/uL (ref 3.80–5.20)
RDW: 14.1 % (ref 11.5–14.5)
WBC: 11.2 10*3/uL — AB (ref 3.6–11.0)

## 2015-01-23 LAB — COMPREHENSIVE METABOLIC PANEL
ALBUMIN: 3.9 g/dL (ref 3.5–5.0)
ALT: 22 U/L (ref 14–54)
ANION GAP: 6 (ref 5–15)
AST: 22 U/L (ref 15–41)
Alkaline Phosphatase: 55 U/L (ref 38–126)
BUN: 15 mg/dL (ref 6–20)
CALCIUM: 8.5 mg/dL — AB (ref 8.9–10.3)
CO2: 27 mmol/L (ref 22–32)
Chloride: 107 mmol/L (ref 101–111)
Creatinine, Ser: 0.72 mg/dL (ref 0.44–1.00)
GFR calc non Af Amer: >60 mL/min (ref >60)
GLUCOSE: 96 mg/dL (ref 65–99)
Potassium: 3.6 mmol/L (ref 3.5–5.1)
Sodium: 140 mmol/L (ref 135–145)
Total Bilirubin: 0.4 mg/dL (ref 0.3–1.2)
Total Protein: 7.4 g/dL (ref 6.5–8.1)

## 2015-01-23 LAB — TROPONIN I: Troponin I: <0.03 ng/mL (ref <0.031)

## 2015-01-23 LAB — LIPASE, BLOOD: Lipase: 39 U/L (ref 22–51)

## 2015-01-23 LAB — POCT PREGNANCY, URINE: PREG TEST UR: NEGATIVE

## 2015-01-23 MED ORDER — BENZONATATE 100 MG PO CAPS: 100.0000 mg | ORAL_CAPSULE | Freq: Three times a day (TID) | ORAL | Status: DC | PRN

## 2015-01-23 MED ORDER — METOCLOPRAMIDE HCL 5 MG PO TABS: 5.0000 mg | ORAL_TABLET | Freq: Four times a day (QID) | ORAL | Status: DC | PRN

## 2015-01-23 MED ORDER — IPRATROPIUM-ALBUTEROL 0.5-2.5 (3) MG/3ML IN SOLN
RESPIRATORY_TRACT | Status: AC
  Filled 2015-01-23: qty 3

## 2015-01-23 MED ORDER — IPRATROPIUM-ALBUTEROL 0.5-2.5 (3) MG/3ML IN SOLN
3.0000 mL | Freq: Once | RESPIRATORY_TRACT | Status: AC
  Administered 2015-01-23: 3 mL via RESPIRATORY_TRACT

## 2015-01-23 MED ORDER — ALBUTEROL SULFATE HFA 108 (90 BASE) MCG/ACT IN AERS: 2.0000 | INHALATION_SPRAY | Freq: Four times a day (QID) | RESPIRATORY_TRACT | Status: DC | PRN

## 2015-01-23 MED ORDER — FLUTICASONE PROPIONATE 50 MCG/ACT NA SUSP: 1.0000 | Freq: Every day | NASAL | Status: DC

## 2015-01-23 NOTE — ED Provider Notes (Signed)
[facility] Emergency Department Provider Note ____________________________________________  Time seen: 1710  I have reviewed the triage vital signs and the nursing notes.  HISTORY  Chief Complaint Abdominal Pain; Emesis; and Diarrhea  HPI Claire Mcdonald is a 49 y.o. female who reports to the ED with complaints of a head cold 2 weeks. She describes a productive cough with green sputum, chest discomfort, and wheezing. Her secondary complaint is right lower quadrant abdominal pain. She was recently seen here in the ED the about 2 weeks earlier, for similar complaints. Denies any dysuria, fever, chills, sweats. She notes some diarrhea with intermittent nausea and vomiting.  Past Medical History  Diagnosis Date  . Hypertension   . High cholesterol   . Anxiety   . Depression     Patient Active Problem List   Diagnosis Date Noted  . DDD (degenerative disc disease), lumbosacral 01/01/2015  . DJD (degenerative joint disease)sacroiliac joint 01/01/2015  . Sacroiliac joint disease 01/01/2015   Past Surgical History  Procedure Laterality Date  . Tubal ligation  1998  . Ovarian cyst removal Left    Current Outpatient Rx  Name  Route  Sig  Dispense  Refill  . acetaminophen (TYLENOL) 500 MG tablet   Oral   Take 500 mg by mouth every 6 (six) hours as needed for pain.         Marland Kitchen albuterol (PROVENTIL HFA;VENTOLIN HFA) 108 (90 BASE) MCG/ACT inhaler   Inhalation   Inhale 2 puffs into the lungs every 6 (six) hours as needed for wheezing or shortness of breath.   1 Inhaler   0   . benzonatate (TESSALON PERLES) 100 MG capsule   Oral   Take 1 capsule (100 mg total) by mouth 3 (three) times daily as needed for cough (Take 1-2 per dose).   30 capsule   0   . DULoxetine (CYMBALTA) 60 MG capsule   Oral   Take 60 mg by mouth daily.         . fluticasone (FLONASE) 50 MCG/ACT nasal spray   Each Nare   Place 1 spray into both nostrils daily.   16 g   0   .  furosemide (LASIX) 40 MG tablet   Oral   Take 40 mg by mouth daily.         Marland Kitchen gabapentin (NEURONTIN) 300 MG capsule   Oral   Take 300 mg by mouth daily.         Marland Kitchen HYDROcodone-acetaminophen (NORCO/VICODIN) 5-325 MG per tablet   Oral   Take 2 tablets by mouth every 4 (four) hours as needed for pain. Patient not taking: Reported on 01/01/2015   10 tablet   0   . metoCLOPramide (REGLAN) 5 MG tablet   Oral   Take 1 tablet (5 mg total) by mouth every 6 (six) hours as needed for nausea.   10 tablet   0   . metoprolol succinate (TOPROL-XL) 100 MG 24 hr tablet   Oral   Take 100 mg by mouth daily. Take with or immediately following a meal.         . naproxen (NAPROSYN) 500 MG tablet   Oral   Take 1 tablet (500 mg total) by mouth 2 (two) times daily with a meal.   20 tablet   0   . promethazine (PHENERGAN) 25 MG tablet   Oral   Take 1 tablet (25 mg total) by mouth every 6 (six) hours as needed for nausea. Patient  not taking: Reported on 01/01/2015   20 tablet   0   . topiramate (TOPAMAX) 100 MG tablet   Oral   Take 100 mg by mouth 2 (two) times daily.          Allergies Toradol; Tramadol; and Zofran  Family History  Problem Relation Age of Onset  . Diabetes Father   . Asthma Father   . Hyperlipidemia Father   . Hypertension Father   . Kidney disease Father   . Heart disease Neg Hx   . Stroke Neg Hx   . Asthma Mother   . Depression Mother   . Hyperlipidemia Mother   . Hypertension Mother   . Cancer Maternal Grandmother   . Early death Maternal Grandmother   . Early death Paternal Grandmother    Social History History  Substance Use Topics  . Smoking status: Never Smoker   . Smokeless tobacco: Never Used  . Alcohol Use: No   Review of Systems  Constitutional: Negative for fever. Eyes: Negative for visual changes. ENT: Negative for sore throat. Positive for nasal drainage. Cardiovascular: Negative for chest pain. Respiratory: Negative for shortness of  breath. Positive for cough Gastrointestinal: Positive for abdominal pain, vomiting and diarrhea. Genitourinary: Negative for dysuria. Musculoskeletal: Negative for back pain. Skin: Negative for rash. Neurological: Negative for headaches, focal weakness or numbness. ____________________________________________  PHYSICAL EXAM:  VITAL SIGNS: ED Triage Vitals  Enc Vitals Group     BP 01/23/15 1651 145/96 mmHg     Pulse Rate 01/23/15 1651 69     Resp 01/23/15 1651 20     Temp 01/23/15 1651 97.8 F (36.6 C)     Temp Source 01/23/15 1651 Oral     SpO2 01/23/15 1651 99 %     Weight 01/23/15 1651 225 lb (102.059 kg)     Height 01/23/15 1651 5\' 2"  (1.575 m)     Head Cir --      Peak Flow --      Pain Score 01/23/15 1652 10     Pain Loc --      Pain Edu? --      Excl. in [place]? --     Constitutional: Alert and oriented. Well appearing and in no distress. HEENT: Normocephalic and atraumatic. Conjunctivae are normal. PERRL. Normal extraocular movements. No congestion/rhinnorhea. Mucous membranes are moist. TMs clear bilaterally. Neck supple without lymphadenopathy. Cardiovascular: Normal rate, regular rhythm.  Respiratory: Normal respiratory effort. Mild end expiratory wheeze noted.  Gastrointestinal: Soft and nontender. No distention. Normal bowel sounds x 4. Musculoskeletal: Nontender with normal range of motion in all extremities. No lower extremity tenderness nor edema. Neurologic:  Normal speech and language. No gross focal neurologic deficits are appreciated. Skin:  Skin is warm, dry and intact. No rash noted. Psychiatric: Mood and affect are normal. Patient exhibits appropriate insight and judgment. ____________________________________________   LABS (pertinent positives/negatives)  Labs Reviewed  CBC WITH DIFFERENTIAL/PLATELET - Abnormal; Notable for the following:    WBC 11.2 (*)    Neutro Abs 7.6 (*)    All other components within normal limits  COMPREHENSIVE METABOLIC PANEL  - Abnormal; Notable for the following:    Calcium 8.5 (*)    All other components within normal limits  URINALYSIS COMPLETEWITH MICROSCOPIC ([facility] ONLY) - Abnormal; Notable for the following:    Color, Urine YELLOW (*)    APPearance HAZY (*)    Squamous Epithelial / LPF 6-30 (*)    All other components within normal limits  TROPONIN  I  LIPASE, BLOOD  POC URINE PREG, ED  POCT PREGNANCY, URINE  ____________________________________________  EKG EKG: normal EKG, normal sinus rhythm, unchanged from previous tracings, normal sinus rhythm.  Rate 65 bpm  Normal Axis  No ST changes ____________________________________________   RADIOLOGY CXR IMPRESSION: No active cardiopulmonary disease. ____________________________________________  PROCEDURES  Duo Neb x 1 ____________________________________________  INITIAL IMPRESSION / ASSESSMENT AND PLAN / ED COURSE  Viral bronchitis/URI. Epigastric abdominal pain consistent with history of GERD. No indication of acute respirator infection or acute abdominal process. Radiology and lab results reviewed with patient. Discharge home with prescription Tesslon Perles, albuterol inhaler, Reglan, and Flonase. Suggest follow-up with primary care provider for continued symptoms.  ____________________________________________  FINAL CLINICAL IMPRESSION(S) / ED DIAGNOSES  Final diagnoses:  URI (upper respiratory infection)  Abdominal pain, acute, epigastric  Gastroesophageal reflux disease without esophagitis     Melvenia Needles, PA-C 01/23/15 1814  Orbie Pyo, MD 01/25/15 [phone]

## 2015-01-23 NOTE — Discharge Instructions (Signed)
Cough, Adult  A cough is a reflex. It helps you clear your throat and airways. A cough can help heal your body. A cough can last 2 or 3 weeks (acute) or may last more than 8 weeks (chronic). Some common causes of a cough can include an infection, allergy, or a cold. HOME CARE  Only take medicine as told by your doctor.  If given, take your medicines (antibiotics) as told. Finish them even if you start to feel better.  Use a cold steam vaporizer or humidifier in your home. This can help loosen thick spit (secretions).  Sleep so you are almost sitting up (semi-upright). Use pillows to do this. This helps reduce coughing.  Rest as needed.  Stop smoking if you smoke. GET HELP RIGHT AWAY IF:  You have yellowish-white fluid (pus) in your thick spit.  Your cough gets worse.  Your medicine does not reduce coughing, and you are losing sleep.  You cough up blood.  You have trouble breathing.  Your pain gets worse and medicine does not help.  You have a fever. MAKE SURE YOU:   Understand these instructions.  Will watch your condition.  Will get help right away if you are not doing well or get worse. Document Released: 04/24/2011 Document Revised: 12/26/2013 Document Reviewed: 04/24/2011 [facility] Patient Information 2015 [place], [place]. This information is not intended to replace advice given to you by your health care provider. Make sure you discuss any questions you have with your health care provider.  Gastroesophageal Reflux Disease, Adult Gastroesophageal reflux disease (GERD) happens when acid from your stomach goes into your food pipe (esophagus). The acid can cause a burning feeling in your chest. Over time, the acid can make small holes (ulcers) in your food pipe.  HOME CARE  Ask your doctor for advice about:  Losing weight.  Quitting smoking.  Alcohol use.  Avoid foods and drinks that make your problems worse. You may want to avoid:  Caffeine and  alcohol.  Chocolate.  Mints.  Garlic and onions.  Spicy foods.  Citrus fruits, such as oranges, lemons, or limes.  Foods that contain tomato, such as sauce, chili, salsa, and pizza.  Fried and fatty foods.  Avoid lying down for 3 hours before you go to bed or before you take a nap.  Eat small meals often, instead of large meals.  Wear loose-fitting clothing. Do not wear anything tight around your waist.  Raise (elevate) the head of your bed 6 to 8 inches with wood blocks. Using extra pillows does not help.  Only take medicines as told by your doctor.  Do not take aspirin or ibuprofen. GET HELP RIGHT AWAY IF:   You have pain in your arms, neck, jaw, teeth, or back.  Your pain gets worse or changes.  You feel sick to your stomach (nauseous), throw up (vomit), or sweat (diaphoresis).  You feel short of breath, or you pass out (faint).  Your throw up is green, yellow, black, or looks like coffee grounds or blood.  Your poop (stool) is red, bloody, or black. MAKE SURE YOU:   Understand these instructions.  Will watch your condition.  Will get help right away if you are not doing well or get worse. Document Released: 01/28/2008 Document Revised: 11/03/2011 Document Reviewed: 02/28/2011 [facility] Patient Information 2015 [place], [place]. This information is not intended to replace advice given to you by your health care provider. Make sure you discuss any questions you have with your health care provider.  Your exam, labs, and chest x-ray are normal today.  You are likely having cough due to allergy or viral causes.  Take the prescription meds as directed.  Increase fluid intake, and follow-up with your provider as needed. Consider adding an OTC allergy medicine (Claritin/Allegra/Zyrtec) for daily symptom relief. Continue your daily omeprazole.

## 2015-01-23 NOTE — ED Notes (Addendum)
Patient to ER for "head cold" x2 weeks. Denies being on antibiotics. States she is now coughing up green sputum. Also c/o chest pain and wheezing. States she has right lower quadrant abdominal pain that is burning. Denies any burning or pain with urination. + Diarrhea with multiple episodes, nausea with vomiting x3.

## 2015-01-26 ENCOUNTER — Encounter: Payer: Self-pay | Admitting: Family Medicine

## 2015-01-26 ENCOUNTER — Ambulatory Visit (INDEPENDENT_AMBULATORY_CARE_PROVIDER_SITE_OTHER): Payer: Medicaid Other | Admitting: Family Medicine

## 2015-01-26 VITALS — BP 118/80 | HR 59 | Temp 98.0°F | Ht 61.5 in | Wt 220.0 lb

## 2015-01-26 DIAGNOSIS — J0100 Acute maxillary sinusitis, unspecified: Secondary | ICD-10-CM

## 2015-01-26 MED ORDER — AMOXICILLIN 875 MG PO TABS: 875.0000 mg | ORAL_TABLET | Freq: Two times a day (BID) | ORAL | Status: DC

## 2015-01-26 NOTE — Progress Notes (Signed)
BP 118/80 mmHg  Pulse 59  Temp(Src) 98 F (36.7 C)  Ht 5' 1.5" (1.562 m)  Wt 220 lb (99.791 kg)  BMI 40.90 kg/m2  SpO2 99%  LMP 01/12/2015 (Approximate)   Subjective:    Patient ID: Claire Mcdonald, female    DOB: Dec 12, 1965, 49 y.o.   MRN: [id number]  HPI: Claire Mcdonald is a 49 y.o. female presenting on 01/26/2015 for Cough and Sinusitis Upper Respiratory Infection: Patient complains of symptoms of a URI, follow up on a URI. Symptoms include cough. Onset of symptoms was 10 days ago, unchanged since that time. Pt with sinus pressure nasal congestion  Also with fever Was seem at [facility] and treated for URI, no antibiotic was given, told to see PCP if not improved  Relevant past medical, surgical, family and social history reviewed and updated as indicated. Interim medical history since our last visit reviewed. Allergies and medications reviewed and updated.  Current Outpatient Prescriptions on File Prior to Visit  Medication Sig  . acetaminophen (TYLENOL) 500 MG tablet Take 500 mg by mouth every 6 (six) hours as needed for pain.  Marland Kitchen albuterol (PROVENTIL HFA;VENTOLIN HFA) 108 (90 BASE) MCG/ACT inhaler Inhale 2 puffs into the lungs every 6 (six) hours as needed for wheezing or shortness of breath.  . DULoxetine (CYMBALTA) 60 MG capsule Take 60 mg by mouth daily.  . fluticasone (FLONASE) 50 MCG/ACT nasal spray Place 1 spray into both nostrils daily.  . furosemide (LASIX) 40 MG tablet Take 40 mg by mouth daily.  Marland Kitchen gabapentin (NEURONTIN) 300 MG capsule Take 300 mg by mouth daily.  . metoCLOPramide (REGLAN) 5 MG tablet Take 1 tablet (5 mg total) by mouth every 6 (six) hours as needed for nausea.  . metoprolol succinate (TOPROL-XL) 100 MG 24 hr tablet Take 100 mg by mouth daily. Take with or immediately following a meal.  . naproxen (NAPROSYN) 500 MG tablet Take 1 tablet (500 mg total) by mouth 2 (two) times daily with a meal.  . topiramate (TOPAMAX) 100 MG tablet Take 100 mg by mouth 2 (two) times  daily.  Marland Kitchen HYDROcodone-acetaminophen (NORCO/VICODIN) 5-325 MG per tablet Take 2 tablets by mouth every 4 (four) hours as needed for pain. (Patient not taking: Reported on 01/01/2015)  . promethazine (PHENERGAN) 25 MG tablet Take 1 tablet (25 mg total) by mouth every 6 (six) hours as needed for nausea. (Patient not taking: Reported on 01/01/2015)   No current facility-administered medications on file prior to visit.    Review of Systems  Constitutional: Positive for fever and chills.  HENT: Positive for congestion. Negative for dental problem, ear discharge and facial swelling.   Respiratory: Positive for cough. Negative for chest tightness and wheezing.   Skin: Negative for rash.    Per HPI unless specifically indicated above     Objective:    BP 118/80 mmHg  Pulse 59  Temp(Src) 98 F (36.7 C)  Ht 5' 1.5" (1.562 m)  Wt 220 lb (99.791 kg)  BMI 40.90 kg/m2  SpO2 99%  LMP 01/12/2015 (Approximate)  Wt Readings from Last 3 Encounters:  01/26/15 220 lb (99.791 kg)  01/23/15 225 lb (102.059 kg)  01/01/15 220 lb (99.791 kg)    Physical Exam  Constitutional: She is oriented to person, place, and time. She appears well-developed and well-nourished. No distress.  HENT:  Head: Normocephalic and atraumatic.  Right Ear: Hearing and tympanic membrane normal.  Left Ear: Hearing and tympanic membrane normal.  Nose: Mucosal edema  present.  Mouth/Throat: Oropharyngeal exudate present.  Eyes: Conjunctivae and lids are normal. Right eye exhibits no discharge. Left eye exhibits no discharge. No scleral icterus.  Pulmonary/Chest: Effort normal and breath sounds normal. No respiratory distress.  Musculoskeletal: Normal range of motion.  Neurological: She is alert and oriented to person, place, and time.  Skin: Skin is intact. No rash noted.  Psychiatric: She has a normal mood and affect. Her speech is normal and behavior is normal. Judgment and thought content normal. Cognition and memory are  normal.    Results for orders placed or performed during the hospital encounter of 01/23/15  CBC WITH DIFFERENTIAL  Result Value Ref Range   WBC 11.2 (H) 3.6 - 11.0 K/uL   RBC 4.78 3.80 - 5.20 MIL/uL   Hemoglobin 14.4 12.0 - 16.0 g/dL   HCT 44.0 35.0 - 47.0 %   MCV 92.0 80.0 - 100.0 fL   MCH 30.2 26.0 - 34.0 pg   MCHC 32.8 32.0 - 36.0 g/dL   RDW 14.1 11.5 - 14.5 %   Platelets 338 150 - 440 K/uL   Neutrophils Relative % 67 %   Neutro Abs 7.6 (H) 1.4 - 6.5 K/uL   Lymphocytes Relative 25 %   Lymphs Abs 2.8 1.0 - 3.6 K/uL   Monocytes Relative 6 %   Monocytes Absolute 0.7 0.2 - 0.9 K/uL   Eosinophils Relative 2 %   Eosinophils Absolute 0.2 0 - 0.7 K/uL   Basophils Relative 0 %   Basophils Absolute 0.0 0 - 0.1 K/uL  Comprehensive metabolic panel  Result Value Ref Range   Sodium 140 135 - 145 mmol/L   Potassium 3.6 3.5 - 5.1 mmol/L   Chloride 107 101 - 111 mmol/L   CO2 27 22 - 32 mmol/L   Glucose, Bld 96 65 - 99 mg/dL   BUN 15 6 - 20 mg/dL   Creatinine, Ser 0.72 0.44 - 1.00 mg/dL   Calcium 8.5 (L) 8.9 - 10.3 mg/dL   Total Protein 7.4 6.5 - 8.1 g/dL   Albumin 3.9 3.5 - 5.0 g/dL   AST 22 15 - 41 U/L   ALT 22 14 - 54 U/L   Alkaline Phosphatase 55 38 - 126 U/L   Total Bilirubin 0.4 0.3 - 1.2 mg/dL   GFR calc non Af Amer >60 >60 mL/min   GFR calc Af Amer >60 >60 mL/min   Anion gap 6 5 - 15  Urinalysis complete, with microscopic [facility])  Result Value Ref Range   Color, Urine YELLOW (A) YELLOW   APPearance HAZY (A) CLEAR   Glucose, UA NEGATIVE NEGATIVE mg/dL   Bilirubin Urine NEGATIVE NEGATIVE   Ketones, ur NEGATIVE NEGATIVE mg/dL   Specific Gravity, Urine 1.018 1.005 - 1.030   Hgb urine dipstick NEGATIVE NEGATIVE   pH 6.0 5.0 - 8.0   Protein, ur NEGATIVE NEGATIVE mg/dL   Nitrite NEGATIVE NEGATIVE   Leukocytes, UA NEGATIVE NEGATIVE   RBC / HPF 0-5 0 - 5 RBC/hpf   WBC, UA 0-5 0 - 5 WBC/hpf   Bacteria, UA NONE SEEN NONE SEEN   Squamous Epithelial / LPF 6-30 (A) NONE SEEN    Mucous PRESENT    Hyaline Casts, UA PRESENT   Troponin I  Result Value Ref Range   Troponin I <0.03 <0.031 ng/mL  Lipase, blood  Result Value Ref Range   Lipase 39 22 - 51 U/L  Pregnancy, urine POC  Result Value Ref Range   Preg Test, Ur NEGATIVE  NEGATIVE      Assessment & Plan:   Problem List Items Addressed This Visit    None    Visit Diagnoses    Acute maxillary sinusitis, recurrence not specified    -  Primary    Discussed use of OTC meds and tylenol Increase fluids Give work note    Relevant Medications    amoxicillin (AMOXIL) 875 MG tablet        Meds ordered this encounter  Medications  . amoxicillin (AMOXIL) 875 MG tablet    Sig: Take 1 tablet (875 mg total) by mouth 2 (two) times daily.    Dispense:  20 tablet    Refill:  0    Follow up plan: Return if symptoms worsen or fail to improve.

## 2015-01-28 ENCOUNTER — Other Ambulatory Visit: Payer: Self-pay | Admitting: Pain Medicine

## 2015-01-28 DIAGNOSIS — M533 Sacrococcygeal disorders, not elsewhere classified: Secondary | ICD-10-CM

## 2015-01-28 DIAGNOSIS — M47816 Spondylosis without myelopathy or radiculopathy, lumbar region: Secondary | ICD-10-CM | POA: Insufficient documentation

## 2015-01-28 DIAGNOSIS — M5136 Other intervertebral disc degeneration, lumbar region: Secondary | ICD-10-CM | POA: Insufficient documentation

## 2015-02-01 ENCOUNTER — Encounter: Payer: Self-pay | Admitting: Pain Medicine

## 2015-02-01 ENCOUNTER — Ambulatory Visit: Payer: Medicaid Other | Attending: Pain Medicine | Admitting: Pain Medicine

## 2015-02-01 VITALS — BP 103/74 | HR 55 | Temp 98.1°F | Resp 16 | Ht 62.0 in | Wt 221.0 lb

## 2015-02-01 DIAGNOSIS — M47816 Spondylosis without myelopathy or radiculopathy, lumbar region: Secondary | ICD-10-CM | POA: Insufficient documentation

## 2015-02-01 DIAGNOSIS — M545 Low back pain: Secondary | ICD-10-CM | POA: Diagnosis present

## 2015-02-01 DIAGNOSIS — M533 Sacrococcygeal disorders, not elsewhere classified: Secondary | ICD-10-CM | POA: Diagnosis not present

## 2015-02-01 DIAGNOSIS — M5136 Other intervertebral disc degeneration, lumbar region: Secondary | ICD-10-CM | POA: Insufficient documentation

## 2015-02-01 DIAGNOSIS — M4316 Spondylolisthesis, lumbar region: Secondary | ICD-10-CM | POA: Diagnosis not present

## 2015-02-01 NOTE — Progress Notes (Signed)
Discharged to home ambulatory.  Pre procedure instructions given with teach back 3 done.

## 2015-02-01 NOTE — Patient Instructions (Addendum)
Continue present medications. .   Block of nerves to the sacroiliac joint 02/05/2015  F/U PCP for evaliation of  BP and general medical  condition.  F/U surgical evaluation.  F/U neurological evaluation.  May consider radiofrequency rhizolysis or intraspinal procedures pending response to present treatment and F/U evaluation.  Patient to call [facility] Management Center should patient have concerns prior to scheduled return appointment. GENERAL RISKS AND COMPLICATIONS  What are the risk, side effects and possible complications? Generally speaking, most procedures are safe.  However, with any procedure there are risks, side effects, and the possibility of complications.  The risks and complications are dependent upon the sites that are lesioned, or the type of nerve block to be performed.  The closer the procedure is to the spine, the more serious the risks are.  Great care is taken when placing the radio frequency needles, block needles or lesioning probes, but sometimes complications can occur. 1. Infection: Any time there is an injection through the skin, there is a risk of infection.  This is why sterile conditions are used for these blocks.  There are four possible types of infection. 1. Localized skin infection. 2. Central Nervous System Infection-This can be in the form of Meningitis, which can be deadly. 3. Epidural Infections-This can be in the form of an epidural abscess, which can cause pressure inside of the spine, causing compression of the spinal cord with subsequent paralysis. This would require an emergency surgery to decompress, and there are no guarantees that the patient would recover from the paralysis. 4. Discitis-This is an infection of the intervertebral discs.  It occurs in about 1% of discography procedures.  It is difficult to treat and it may lead to surgery.        2. Pain: the needles have to go through skin and soft tissues, will cause soreness.       3. Damage to  internal structures:  The nerves to be lesioned may be near blood vessels or    other nerves which can be potentially damaged.       4. Bleeding: Bleeding is more common if the patient is taking blood thinners such as  aspirin, Coumadin, Ticiid, Plavix, etc., or if he/she have some genetic predisposition  such as hemophilia. Bleeding into the spinal canal can cause compression of the spinal  cord with subsequent paralysis.  This would require an emergency surgery to  decompress and there are no guarantees that the patient would recover from the  paralysis.       5. Pneumothorax:  Puncturing of a lung is a possibility, every time a needle is introduced in  the area of the chest or upper back.  Pneumothorax refers to free air around the  collapsed lung(s), inside of the thoracic cavity (chest cavity).  Another two possible  complications related to a similar event would include: Hemothorax and Chylothorax.   These are variations of the Pneumothorax, where instead of air around the collapsed  lung(s), you may have blood or chyle, respectively.       6. Spinal headaches: They may occur with any procedures in the area of the spine.       7. Persistent CSF (Cerebro-Spinal Fluid) leakage: This is a rare problem, but may occur  with prolonged intrathecal or epidural catheters either due to the formation of a fistulous  track or a dural tear.       8. Nerve damage: By working so close to the spinal cord, there  is always a possibility of  nerve damage, which could be as serious as a permanent spinal cord injury with  paralysis.       9. Death:  Although rare, severe deadly allergic reactions known as "Anaphylactic  reaction" can occur to any of the medications used.      10. Worsening of the symptoms:  We can always make thing worse.  What are the chances of something like this happening? Chances of any of this occuring are extremely low.  By statistics, you have more of a chance of getting killed in a motor  vehicle accident: while driving to the hospital than any of the above occurring .  Nevertheless, you should be aware that they are possibilities.  In general, it is similar to taking a shower.  Everybody knows that you can slip, hit your head and get killed.  Does that mean that you should not shower again?  Nevertheless always keep in mind that statistics do not mean anything if you happen to be on the wrong side of them.  Even if a procedure has a 1 (one) in a 1,000,000 (million) chance of going wrong, it you happen to be that one..Also, keep in mind that by statistics, you have more of a chance of having something go wrong when taking medications.  Who should not have this procedure? If you are on a blood thinning medication (e.g. Coumadin, Plavix, see list of "Blood Thinners"), or if you have an active infection going on, you should not have the procedure.  If you are taking any blood thinners, please inform your physician.  How should I prepare for this procedure?  Do not eat or drink anything at least six hours prior to the procedure.  Bring a driver with you .  It cannot be a taxi.  Come accompanied by an adult that can drive you back, and that is strong enough to help you if your legs get weak or numb from the local anesthetic.  Take all of your medicines the morning of the procedure with just enough water to swallow them.  If you have diabetes, make sure that you are scheduled to have your procedure done first thing in the morning, whenever possible.  If you have diabetes, take only half of your insulin dose and notify our nurse that you have done so as soon as you arrive at the clinic.  If you are diabetic, but only take blood sugar pills (oral hypoglycemic), then do not take them on the morning of your procedure.  You may take them after you have had the procedure.  Do not take aspirin or any aspirin-containing medications, at least eleven (11) days prior to the procedure.  They may  prolong bleeding.  Wear loose fitting clothing that may be easy to take off and that you would not mind if it got stained with Betadine or blood.  Do not wear any jewelry or perfume  Remove any nail coloring.  It will interfere with some of our monitoring equipment.  NOTE: Remember that this is not meant to be interpreted as a complete list of all possible complications.  Unforeseen problems may occur.  BLOOD THINNERS The following drugs contain aspirin or other products, which can cause increased bleeding during surgery and should not be taken for 2 weeks prior to and 1 week after surgery.  If you should need take something for relief of minor pain, you may take acetaminophen which is found in Tylenol,m Datril, Anacin-3  and Panadol. It is not blood thinner. The products listed below are.  Do not take any of the products listed below in addition to any listed on your instruction sheet.  A.P.C or A.P.C with Codeine Codeine Phosphate Capsules #3 Ibuprofen Ridaura  ABC compound Congesprin Imuran rimadil  Advil Cope Indocin Robaxisal  Alka-Seltzer Effervescent Pain Reliever and Antacid Coricidin or Coricidin-D  Indomethacin Rufen  Alka-Seltzer plus Cold Medicine Cosprin Ketoprofen S-A-C Tablets  Anacin Analgesic Tablets or Capsules Coumadin Korlgesic Salflex  Anacin Extra Strength Analgesic tablets or capsules CP-2 Tablets Lanoril Salicylate  Anaprox Cuprimine Capsules Levenox Salocol  Anexsia-D Dalteparin Magan Salsalate  Anodynos Darvon compound Magnesium Salicylate Sine-off  Ansaid Dasin Capsules Magsal Sodium Salicylate  Anturane Depen Capsules Marnal Soma  APF Arthritis pain formula Dewitt's Pills Measurin Stanback  Argesic Dia-Gesic Meclofenamic Sulfinpyrazone  Arthritis Bayer Timed Release Aspirin Diclofenac Meclomen Sulindac  Arthritis pain formula Anacin Dicumarol Medipren Supac  Analgesic (Safety coated) Arthralgen Diffunasal Mefanamic Suprofen  Arthritis Strength Bufferin  Dihydrocodeine Mepro Compound Suprol  Arthropan liquid Dopirydamole Methcarbomol with Aspirin Synalgos  ASA tablets/Enseals Disalcid Micrainin Tagament  Ascriptin Doan's Midol Talwin  Ascriptin A/D Dolene Mobidin Tanderil  Ascriptin Extra Strength Dolobid Moblgesic Ticlid  Ascriptin with Codeine Doloprin or Doloprin with Codeine Momentum Tolectin  Asperbuf Duoprin Mono-gesic Trendar  Aspergum Duradyne Motrin or Motrin IB Triminicin  Aspirin plain, buffered or enteric coated Durasal Myochrisine Trigesic  Aspirin Suppositories Easprin Nalfon Trillsate  Aspirin with Codeine Ecotrin Regular or Extra Strength Naprosyn Uracel  Atromid-S Efficin Naproxen Ursinus  Auranofin Capsules Elmiron Neocylate Vanquish  Axotal Emagrin Norgesic Verin  Azathioprine Empirin or Empirin with Codeine Normiflo Vitamin E  Azolid Emprazil Nuprin Voltaren  Bayer Aspirin plain, buffered or children's or timed BC Tablets or powders Encaprin Orgaran Warfarin Sodium  Buff-a-Comp Enoxaparin Orudis Zorpin  Buff-a-Comp with Codeine Equegesic Os-Cal-Gesic   Buffaprin Excedrin plain, buffered or Extra Strength Oxalid   Bufferin Arthritis Strength Feldene Oxphenbutazone   Bufferin plain or Extra Strength Feldene Capsules Oxycodone with Aspirin   Bufferin with Codeine Fenoprofen Fenoprofen Pabalate or Pabalate-SF   Buffets II Flogesic Panagesic   Buffinol plain or Extra Strength Florinal or Florinal with Codeine Panwarfarin   Buf-Tabs Flurbiprofen Penicillamine   Butalbital Compound Four-way cold tablets Penicillin   Butazolidin Fragmin Pepto-Bismol   Carbenicillin Geminisyn Percodan   Carna Arthritis Reliever Geopen Persantine   Carprofen Gold's salt Persistin   Chloramphenicol Goody's Phenylbutazone   Chloromycetin Haltrain Piroxlcam   Clmetidine heparin Plaquenil   Cllnoril Hyco-pap Ponstel   Clofibrate Hydroxy chloroquine Propoxyphen         Before stopping any of these medications, be sure to consult the  physician who ordered them.  Some, such as Coumadin (Warfarin) are ordered to prevent or treat serious conditions such as "deep thrombosis", "pumonary embolisms", and other heart problems.  The amount of time that you may need off of the medication may also vary with the medication and the reason for which you were taking it.  If you are taking any of these medications, please make sure you notify your pain physician before you undergo any procedures.         Selective Nerve Root Block Patient Information  Description: Specific nerve roots exit the spinal canal and these nerves can be compressed and inflamed by a bulging disc and bone spurs.  By injecting steroids on the nerve root, we can potentially decrease the inflammation surrounding these nerves, which often leads to decreased pain.  Also, by  injecting local anesthesia on the nerve root, this can provide [place] helpful information to give to your referring doctor if it decreases your pain.  Selective nerve root blocks can be done along the spine from the neck to the low back depending on the location of your pain.   After numbing the skin with local anesthesia, a small needle is passed to the nerve root and the position of the needle is verified using x-ray pictures.  After the needle is in correct position, we then deposit the medication.  You may experience a pressure sensation while this is being done.  The entire block usually lasts less than 15 minutes.  Conditions that may be treated with selective nerve root blocks:  Low back and leg pain  Spinal stenosis  Diagnostic block prior to potential surgery  Neck and arm pain  Post laminectomy syndrome  Preparation for the injection:  1. Do not eat any solid food or dairy products within 6 hours of your appointment. 2. You may drink clear liquids up to 2 hours before an appointment.  Clear liquids include water, black coffee, juice or soda.  No milk or cream please. 3. You may take  your regular medications, including pain medications, with a sip of water before your appointment.  Diabetics should hold regular insulin (if taken separately) and take 1/2 normal NPH dose the morning of the procedure.  Carry some sugar containing items with you to your appointment. 4. A driver must accompany you and be prepared to drive you home after your procedure. 5. Bring all your current medications with you. 6. An IV may be inserted and sedation may be given at the discretion of the physician. 7. A blood pressure cuff, EKG, and other monitors will often be applied during the procedure.  Some patients may need to have extra oxygen administered for a short period. 8. You will be asked to provide medical information, including allergies, prior to the procedure.  We must know immediately if you are taking blood  Thinners (like Coumadin) or if you are allergic to IV iodine contrast (dye).  Possible side-effects: All are usually temporary  Bleeding from needle site  Light headedness  Numbness and tingling  Decreased blood pressure  Weakness in arms/legs  Pressure sensation in back/neck  Pain at injection site (several days)  Possible complications: All are extremely rare  Infection  Nerve injury  Spinal headache (a headache wore with upright position)  Call if you experience:  Fever/chills associated with headache or increased back/neck pain  Headache worsened by an upright position  New onset weakness or numbness of an extremity below the injection site  Hives or difficulty breathing (go to the emergency room)  Inflammation or drainage at the injection site(s)  Severe back/neck pain greater than usual  New symptoms which are concerning to you  Please note:  Although the local anesthetic injected can often make your back or neck feel good for several hours after the injection the pain will likely return.  It takes 3-5 days for steroids to work on the nerve root.  You may not notice any pain relief for at least one week.  If effective, we will often do a series of 3 injections spaced 3-6 weeks apart to maximally decrease your pain.    If you have any questions, please call [phone] [facility]

## 2015-02-01 NOTE — Progress Notes (Signed)
Safety precautions to be maintained throughout the outpatient stay will include: orient to surroundings, keep bed in low position, maintain call bell within reach at all times, provide assistance with transfer out of bed and ambulation.  

## 2015-02-01 NOTE — Progress Notes (Signed)
   Subjective:    Patient ID: Claire Mcdonald, female    DOB: 1966/03/27, 49 y.o.   MRN: [id number]  HPI  Patient is 49 year old female returns to [place] for further evaluation and treatment of pain involving the region of the lower back and lower extremity regions predominantly. Patient is noted return of her pain which is aggravated by standing walking twisting turning maneuvers. Eyes any trauma change in events of daily living the call significant change in symptomatology. She states the pain radiates from the lumbar region to the buttocks and is aggravated by climbing stairs especially. We'll proceed with block of nerves to the sacroiliac joint at time return appointment. The patient was understanding and agreement with suggested treatment plan   Review of Systems     Objective:   Physical Exam   There was tenderness of the splenius capitis and occipitalis musculature region of mild degree. Palpation over the region of the cervical facet and thoracic facet paraspinal musculature region reproduced pain of mild to moderate degree. There was mild tinnitus of the acromioclavicular glenohumeral joint region. Area palpation of the thoracic facet thoracic paraspinal musculature region was a tends to palpation on the left greater than the right. No crepitus of the thoracic region haven't been noted. There was tenderness over the    paraspinal musculature region of the lumbar region of moderate moderately severe degree with lateral bending and rotation and extension and palpation of the lumbar facets reproducing severe discomfort . Patient of the PSIS and PII S regions reproduced moderate to moderately severe discomfort. With moderate tends to palpation over the greater trochanteric region noted as well. Leg raising was tolerates approximately 30 without an increase of pain with dorsiflexion noted. Abdomen nontender and no costovertebral tenderness was noted.          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  Block of nerves to the sacroiliac joint to be performed at time return appointment  F/U PCP for evaliation of  BP and general medical  condition.  F/U surgical evaluation.  F/U neurological evaluation.  May consider radiofrequency rhizolysis or intraspinal procedures pending response to present treatment and F/U evaluation.  Patient to call Pain Management Center should patient have concerns prior to scheduled return appointment.

## 2015-02-05 ENCOUNTER — Encounter: Payer: Self-pay | Admitting: Pain Medicine

## 2015-02-05 ENCOUNTER — Ambulatory Visit: Payer: Medicaid Other | Attending: Pain Medicine | Admitting: Pain Medicine

## 2015-02-05 VITALS — BP 96/65 | HR 59 | Temp 97.8°F | Resp 14 | Ht 62.0 in | Wt 220.0 lb

## 2015-02-05 DIAGNOSIS — M545 Low back pain: Secondary | ICD-10-CM | POA: Diagnosis present

## 2015-02-05 DIAGNOSIS — M533 Sacrococcygeal disorders, not elsewhere classified: Secondary | ICD-10-CM

## 2015-02-05 DIAGNOSIS — M47816 Spondylosis without myelopathy or radiculopathy, lumbar region: Secondary | ICD-10-CM | POA: Insufficient documentation

## 2015-02-05 DIAGNOSIS — M5136 Other intervertebral disc degeneration, lumbar region: Secondary | ICD-10-CM

## 2015-02-05 DIAGNOSIS — M4316 Spondylolisthesis, lumbar region: Secondary | ICD-10-CM | POA: Diagnosis not present

## 2015-02-05 MED ORDER — FENTANYL CITRATE (PF) 100 MCG/2ML IJ SOLN
INTRAMUSCULAR | Status: AC
  Administered 2015-02-05: 100 ug via INTRAVENOUS
  Filled 2015-02-05: qty 2

## 2015-02-05 MED ORDER — TRIAMCINOLONE ACETONIDE 40 MG/ML IJ SUSP
INTRAMUSCULAR | Status: AC
  Administered 2015-02-05: 40 mg
  Filled 2015-02-05: qty 1

## 2015-02-05 MED ORDER — ORPHENADRINE CITRATE 30 MG/ML IJ SOLN
INTRAMUSCULAR | Status: AC
  Administered 2015-02-05: 60 mg
  Filled 2015-02-05: qty 2

## 2015-02-05 MED ORDER — BUPIVACAINE HCL (PF) 0.25 % IJ SOLN
INTRAMUSCULAR | Status: AC
  Administered 2015-02-05: 30 mL
  Filled 2015-02-05: qty 30

## 2015-02-05 MED ORDER — MIDAZOLAM HCL 5 MG/5ML IJ SOLN
INTRAMUSCULAR | Status: AC
  Administered 2015-02-05: 3 mg via INTRAVENOUS
  Filled 2015-02-05: qty 5

## 2015-02-05 NOTE — Patient Instructions (Addendum)
Continue present medications.  F/U PCP for evaliation of  BP and general medical  condition.  F/U surgical evaluation.  F/U neurological evaluation.  May consider radiofrequency rhizolysis or intraspinal procedures pending response to present treatment and F/U evaluation.  Patient to call Pain Management Center should patient have concerns prior to scheduled return appointment. Pain Management Discharge Instructions  General Discharge Instructions :  If you need to reach your doctor call: Monday-Friday 8:00 am - 4:00 pm at [phone] or toll free [phone].  After clinic hours [phone] to have operator reach doctor.  Bring all of your medication bottles to all your appointments in the pain clinic.  To cancel or reschedule your appointment with Pain Management please remember to call 24 hours in advance to avoid a fee.  Refer to the educational materials which you have been given on: General Risks, I had my Procedure. Discharge Instructions, Post Sedation.  Post Procedure Instructions:  The drugs you were given will stay in your system until tomorrow, so for the next 24 hours you should not drive, make any legal decisions or drink any alcoholic beverages.  You may eat anything you prefer, but it is better to start with liquids then soups and crackers, and gradually work up to solid foods.  Please notify your doctor immediately if you have any unusual bleeding, trouble breathing or pain that is not related to your normal pain.  Depending on the type of procedure that was done, some parts of your body may feel week and/or numb.  This usually clears up by tonight or the next day.  Walk with the use of an assistive device or accompanied by an adult for the 24 hours.  You may use ice on the affected area for the first 24 hours.  Put ice in a Ziploc bag and cover with a towel and place against area 15 minutes on 15 minutes off.  You may switch to heat after 24 hours. 

## 2015-02-05 NOTE — Progress Notes (Signed)
Safety precautions to be maintained throughout the outpatient stay will include: orient to surroundings, keep bed in low position, maintain call bell within reach at all times, provide assistance with transfer out of bed and ambulation.  Tolerated po fluids well.  

## 2015-02-05 NOTE — Progress Notes (Signed)
Subjective:    Patient ID: Claire Mcdonald, female    DOB: 06-Sep-1965, 49 y.o.   MRN: [id number]  HPI  PROCEDURE:  Block of nerves to the sacroiliac joint.   NOTE:  The patient is a 49 y.o. female who [facility] for further evaluation and treatment of pain involving the lower back and lower extremity region with pain in the region of the buttocks as well. Prior  MRI.  revealed degenerative changes of the lumbar spine with multilevel involvement with grade 2 anterolisthesis L5 on S1 and bilateral spondylolisthesis L4-5 with degenerative changes L5-S1 with loss of height at the L5 level the patient was severe pain with palpation over the PSIS and PII S regions as well as with positive Patrick's maneuver period there is concern regarding significant component of patient's pain being due to sacroiliitis sacroiliac joint dysfunction. We will proceed with block of nerves to the sacroiliac joint in attempt to decrease severity of patient's symptoms minimize progression of patient's symptoms and hopefully avoid the need for more involved treatmentThe risks, benefits, expectations of the procedure have been discussed and explained to the patient who is understanding and willing to proceed with interventional treatment in attempt to decrease severity of patient's symptoms, minimize the risk of medication escalation and  hopefully retard the progression of the patient's symptoms. We will proceed with what is felt to be a medically necessary procedure, block of nerves to the sacroiliac joint.   DESCRIPTION OF PROCEDURE:  Block of nerves to the sacroiliac joint.   The patient was taken to the fluoroscopy suite. With the patient in the prone position with EKG, blood pressure, pulse and pulse oximetry monitoring, IV Versed, IV fentanyl conscious sedation, Betadine prep of proposed entry site was performed.   Block of nerves at the L5 vertebral body level.   With the patient in prone  position, under fluoroscopic guidance, a 22 -gauge needle was inserted at the L5 vertebral body level on the . The patient was understanding and in agreement with suggested treatment plan. side. With 15 degrees oblique orientation a 22 -gauge needle was inserted in the region known as Burton's eye or eye of the Scotty dog. Following documentation of needle placement in the area of Burton's eye or eye of the Scotty dog under fluoroscopic guidance, needle placement was then accomplished at the sacral ala level on the  left side.   Needle placement at the sacral ala.   With the patient in prone position under fluoroscopic guidance with AP view of the lumbosacral spine, a 22 -gauge needle was inserted in the region known as the sacral ala on the  left side. Following documentation of needle placement on the  left side under fluoroscopic guidance needle placement was then accomplished at the S1 foramen level.   Needle placement at the S1 foramen level.   With the patient in prone position under fluoroscopic guidance with AP view of the lumbosacral spine and cephalad orientation, a 22 -gauge needle was inserted at the superior and lateral border of the S1 foramen on the  left side. Following documentation of needle placement at the S1 foramen level on the  left side, needle placement was then accomplished at the S2 foramen level on the  left side.   Needle placement at the S2 foramen level.   With the patient in prone position with AP view of the lumbosacral spine with cephalad orientation, a 22 - gauge needle was inserted at the  superior and lateral border of the S2 foramen under fluoroscopic guidance on the  left side. Following needle placement at the L5 vertebral body level, sacral ala, S1 foramen and S2 foramen on the  left side, needle placement was verified on lateral view under fluoroscopic guidance.  Following needle placement documentation on lateral view, each needle was injected with 1 mL of 0.25%  bupivacaine and Kenalog    BLOCK OF NERVES DUE TO SACROILIAC JOINT ON THE RIGHT SIDE    The procedure was performed on the right side and at the same levels as was performed on the left side utilizing the same technique and under fluoroscopic guidance   A total of 10mg  of Kenalog was utilized for the procedure.   PLAN:  1. Medications: The patient will continue presently prescribed medications.  2. The patient will be considered for modification of treatment regimen pending response to the procedure performed on today's visit.  3. The patient is to follow-up with primary care physician for evaluation of blood pressure and general medical condition following the procedure performed on today's visit.  4. Surgical evaluation as discussed.  5. Neurological evaluation as discussed.  6. The patient may be a candidate for radiofrequency procedures, implantation devices and other treatment pending response to treatment performed on today's visit and follow-up evaluation.  7. The patient has been advised to adhere to proper body mechanics and to avoid activities which may exacerbate the patient's symptoms.   Return appointment to [facility] as scheduled.      Review of Systems     Objective:   Physical Exam        Assessment & Plan:

## 2015-02-06 ENCOUNTER — Telehealth: Payer: Self-pay | Admitting: *Deleted

## 2015-02-06 NOTE — Telephone Encounter (Signed)
Left voice mail

## 2015-02-23 DIAGNOSIS — I469 Cardiac arrest, cause unspecified: Secondary | ICD-10-CM

## 2015-02-23 HISTORY — DX: Cardiac arrest, cause unspecified: I46.9

## 2015-03-06 ENCOUNTER — Ambulatory Visit: Payer: Medicaid Other | Attending: Pain Medicine | Admitting: Pain Medicine

## 2015-03-06 ENCOUNTER — Encounter: Payer: Self-pay | Admitting: Pain Medicine

## 2015-03-06 VITALS — BP 124/91 | HR 60 | Temp 98.6°F | Resp 18 | Ht 61.0 in | Wt 216.0 lb

## 2015-03-06 DIAGNOSIS — M533 Sacrococcygeal disorders, not elsewhere classified: Secondary | ICD-10-CM | POA: Diagnosis not present

## 2015-03-06 DIAGNOSIS — M4316 Spondylolisthesis, lumbar region: Secondary | ICD-10-CM | POA: Diagnosis not present

## 2015-03-06 DIAGNOSIS — M79605 Pain in left leg: Secondary | ICD-10-CM | POA: Diagnosis present

## 2015-03-06 DIAGNOSIS — M47816 Spondylosis without myelopathy or radiculopathy, lumbar region: Secondary | ICD-10-CM | POA: Insufficient documentation

## 2015-03-06 DIAGNOSIS — M5136 Other intervertebral disc degeneration, lumbar region: Secondary | ICD-10-CM | POA: Insufficient documentation

## 2015-03-06 DIAGNOSIS — M545 Low back pain: Secondary | ICD-10-CM | POA: Diagnosis present

## 2015-03-06 DIAGNOSIS — M79604 Pain in right leg: Secondary | ICD-10-CM | POA: Diagnosis present

## 2015-03-06 DIAGNOSIS — M7060 Trochanteric bursitis, unspecified hip: Secondary | ICD-10-CM | POA: Insufficient documentation

## 2015-03-06 NOTE — Patient Instructions (Addendum)
Continue present medications  Lumbar facet, medial branch nerve, blocks Monday, 03/12/2015  F/U PCP for evaliation of  BP and general medical  condition.  F/U surgical evaluation  F/U neurological evaluation  May consider radiofrequency rhizolysis or intraspinal procedures pending response to present treatment and F/U evaluation.  Patient to call [facility] should patient have concerns prior to scheduled return appointment. Facet Blocks Patient Information  Description: The facets are joints in the spine between the vertebrae.  Like any joints in the body, facets can become irritated and painful.  Arthritis can also effect the facets.  By injecting steroids and local anesthetic in and around these joints, we can temporarily block the nerve supply to them.  Steroids act directly on irritated nerves and tissues to reduce selling and inflammation which often leads to decreased pain.  Facet blocks may be done anywhere along the spine from the neck to the low back depending upon the location of your pain.   After numbing the skin with local anesthetic (like Novocaine), a small needle is passed onto the facet joints under x-ray guidance.  You may experience a sensation of pressure while this is being done.  The entire block usually lasts about 15-25 minutes.   Conditions which may be treated by facet blocks:   Low back/buttock pain  Neck/shoulder pain  Certain types of headaches  Preparation for the injection:  1. Do not eat any solid food or dairy products within 6 hours of your appointment. 2. You may drink clear liquid up to 2 hours before appointment.  Clear liquids include water, black coffee, juice or soda.  No milk or cream please. 3. You may take your regular medication, including pain medications, with a sip of water before your appointment.  Diabetics should hold regular insulin (if taken separately) and take 1/2 normal NPH dose the morning of the procedure.  Carry some  sugar containing items with you to your appointment. 4. A driver must accompany you and be prepared to drive you home after your procedure. 5. Bring all your current medications with you. 6. An IV may be inserted and sedation may be given at the discretion of the physician. 7. A blood pressure cuff, EKG and other monitors will often be applied during the procedure.  Some patients may need to have extra oxygen administered for a short period. 8. You will be asked to provide medical information, including your allergies and medications, prior to the procedure.  We must know immediately if you are taking blood thinners (like Coumadin/Warfarin) or if you are allergic to IV iodine contrast (dye).  We must know if you could possible be pregnant.  Possible side-effects:   Bleeding from needle site  Infection (rare, may require surgery)  Nerve injury (rare)  Numbness & tingling (temporary)  Difficulty urinating (rare, temporary)  Spinal headache (a headache worse with upright posture)  Light-headedness (temporary)  Pain at injection site (serveral days)  Decreased blood pressure (rare, temporary)  Weakness in arm/leg (temporary)  Pressure sensation in back/neck (temporary)   Call if you experience:   Fever/chills associated with headache or increased back/neck pain  Headache worsened by an upright position  New onset, weakness or numbness of an extremity below the injection site  Hives or difficulty breathing (go to the emergency room)  Inflammation or drainage at the injection site(s)  Severe back/neck pain greater than usual  New symptoms which are concerning to you  Please note:  Although the local anesthetic injected can  often make your back or neck feel good for several hours after the injection, the pain will likely return. It takes 3-7 days for steroids to work.  You may not notice any pain relief for at least one week.  If effective, we will often do a series of  2-3 injections spaced 3-6 weeks apart to maximally decrease your pain.  After the initial series, you may be a candidate for a more permanent nerve block of the facets.  If you have any questions, please call #[phone]) [facility]

## 2015-03-06 NOTE — Progress Notes (Signed)
Safety precautions to be maintained throughout the outpatient stay will include: orient to surroundings, keep bed in low position, maintain call bell within reach at all times, provide assistance with transfer out of bed and ambulation.  

## 2015-03-06 NOTE — Progress Notes (Signed)
   Subjective:    Patient ID: Claire Mcdonald, female    DOB: [birth year], 49 y.o.   MRN: [id number]  HPI  Patient is 49 year old female returns to [place] for further evaluation and treatment of pain involving the region of the lower back and lower extremity region predominantly patient states that her lower back pain lower extremity pain has improved significantly with prior treatment and [facility]. Patient notes return of pain involving the lower back region on the left as well as on the right. Patient denies trauma change in events of daily living the call significant change in symptomatology. He states that pain radiates to the buttocks on the left as well as on the right and continues to the level of the knees with prolonged standing. Patient states that her pain also increases with turning over in bed. We discussed patient's condition there is concern regarding significant component of patient's pain being due to facet syndrome. We will proceed with lumbar facet, medial branch nerve, blocks at time return appointment as discussed on today's visit. The patient is understanding and agreement with suggested treatment plan.    Review of Systems     Objective:   Physical Exam Mild tenderness of the splenius capitis and occipitalis region. There was no evidence of new lesions of the head and neck noted. Palpation over the cervical facet cervical paraspinal musculature region and the thoracic facet thoracic paraspinal musculature region was with minimal discomfort. Patient was with mild tenderness of the acromioclavicular glenohumeral joint region. Patient was with bilaterally equal grip strength. Palpation over the thoracic facet thoracic paraspinal musculature region reproduced mild discomfort. Palpation over the lower thoracic paraspinal muscles region was associated with moderate discomfort. Lateral bending and rotation and extension and palpation over the lumbar facets  reproduce moderate to moderately severe discomfort. Please note no crepitus of the thoracic region was noted. Leg raising was tolerated to prostate 30 without an increase of pain with dorsiflexion noted. DTRs difficult to elicit patient had difficulty relaxing. Palpation over the PSIS PII S regions in the gluteal and piriformis musculature region was associated with moderate discomfort. No sensory deficit of dermatomal distribution was detected. There was mild to moderate tenderness of the greater trochanteric region iliotibial band region. Abdomen was nontender with no costovertebral angle tenderness noted.       Assessment & Plan:    Degenerative disc disease lumbar spine Degenerative changes lumbar spine, grade 2 anterolisthesis, L5 on S1, bilateral spondylolisthesis, L5 degenerative changes, L5-S1 with degeneration with loss of disc height [place] of disc height  Facet syndrome  Sacroiliac joint dysfunction  Greater trochanteric bursitis    Plan   Continue present medications  Lumbar facet, medial branch nerve, blocks to be performed at time return appointment   F/U PCP Dr. Jeananne Rama and  Julian Hy for evaliation of  BP and general medical  condition.  F/U surgical evaluation  F/U neurological evaluation  May consider radiofrequency rhizolysis or intraspinal procedures pending response to present treatment and F/U evaluation.  Patient to call [facility] should patient have concerns prior to scheduled return appointment.

## 2015-03-12 ENCOUNTER — Ambulatory Visit: Payer: Medicaid Other | Attending: Pain Medicine | Admitting: Pain Medicine

## 2015-03-12 VITALS — BP 97/50 | HR 62 | Temp 97.8°F | Resp 14 | Ht 61.0 in | Wt 220.0 lb

## 2015-03-12 DIAGNOSIS — M545 Low back pain: Secondary | ICD-10-CM | POA: Diagnosis present

## 2015-03-12 DIAGNOSIS — M47816 Spondylosis without myelopathy or radiculopathy, lumbar region: Secondary | ICD-10-CM | POA: Insufficient documentation

## 2015-03-12 DIAGNOSIS — M4316 Spondylolisthesis, lumbar region: Secondary | ICD-10-CM | POA: Insufficient documentation

## 2015-03-12 DIAGNOSIS — M533 Sacrococcygeal disorders, not elsewhere classified: Secondary | ICD-10-CM

## 2015-03-12 DIAGNOSIS — M5136 Other intervertebral disc degeneration, lumbar region: Secondary | ICD-10-CM

## 2015-03-12 MED ORDER — MIDAZOLAM HCL 5 MG/5ML IJ SOLN
INTRAMUSCULAR | Status: AC
  Administered 2015-03-12: 3 mg via INTRAVENOUS
  Filled 2015-03-12: qty 5

## 2015-03-12 MED ORDER — TRIAMCINOLONE ACETONIDE 40 MG/ML IJ SUSP
INTRAMUSCULAR | Status: AC
  Administered 2015-03-12: 40 mg
  Filled 2015-03-12: qty 1

## 2015-03-12 MED ORDER — ORPHENADRINE CITRATE 30 MG/ML IJ SOLN
INTRAMUSCULAR | Status: AC
  Filled 2015-03-12: qty 2

## 2015-03-12 MED ORDER — SODIUM CHLORIDE 0.9 % IJ SOLN
INTRAMUSCULAR | Status: AC
  Administered 2015-03-12: 10 mL
  Filled 2015-03-12: qty 20

## 2015-03-12 MED ORDER — FENTANYL CITRATE (PF) 100 MCG/2ML IJ SOLN
INTRAMUSCULAR | Status: AC
  Administered 2015-03-12: 100 ug via INTRAVENOUS
  Filled 2015-03-12: qty 2

## 2015-03-12 MED ORDER — BUPIVACAINE HCL (PF) 0.25 % IJ SOLN
INTRAMUSCULAR | Status: AC
  Filled 2015-03-12: qty 30

## 2015-03-12 NOTE — Patient Instructions (Addendum)
Continue present medications  F/U PCP Dr Jeananne Rama  for evaliation of  BP and general medical  condition.  F/U surgical evaluation  F/U neurological evaluation  May consider radiofrequency rhizolysis or intraspinal procedures pending response to present treatment and F/U evaluation.  Patient to call [facility] should patient have concerns prior to scheduled return appointment. Pain Management Discharge Instructions  General Discharge Instructions :  If you need to reach your doctor call: Monday-Friday 8:00 am - 4:00 pm at [phone] or toll free [phone].  After clinic hours [phone] to have operator reach doctor.  Bring all of your medication bottles to all your appointments in the pain clinic.  To cancel or reschedule your appointment with Pain Management please remember to call 24 hours in advance to avoid a fee.  Refer to the educational materials which you have been given on: General Risks, I had my Procedure. Discharge Instructions, Post Sedation.  Post Procedure Instructions:  The drugs you were given will stay in your system until tomorrow, so for the next 24 hours you should not drive, make any legal decisions or drink any alcoholic beverages.  You may eat anything you prefer, but it is better to start with liquids then soups and crackers, and gradually work up to solid foods.  Please notify your doctor immediately if you have any unusual bleeding, trouble breathing or pain that is not related to your normal pain.  Depending on the type of procedure that was done, some parts of your body may feel week and/or numb.  This usually clears up by tonight or the next day.  Walk with the use of an assistive device or accompanied by an adult for the 24 hours.  You may use ice on the affected area for the first 24 hours.  Put ice in a Ziploc bag and cover with a towel and place against area 15 minutes on 15 minutes off.  You may switch to heat after 24  hours.Selective Nerve Root Block Patient Information  Description: Specific nerve roots exit the spinal canal and these nerves can be compressed and inflamed by a bulging disc and bone spurs.  By injecting steroids on the nerve root, we can potentially decrease the inflammation surrounding these nerves, which often leads to decreased pain.  Also, by injecting local anesthesia on the nerve root, this can provide [place] helpful information to give to your referring doctor if it decreases your pain.  Selective nerve root blocks can be done along the spine from the neck to the low back depending on the location of your pain.   After numbing the skin with local anesthesia, a small needle is passed to the nerve root and the position of the needle is verified using x-ray pictures.  After the needle is in correct position, we then deposit the medication.  You may experience a pressure sensation while this is being done.  The entire block usually lasts less than 15 minutes.  Conditions that may be treated with selective nerve root blocks:  Low back and leg pain  Spinal stenosis  Diagnostic block prior to potential surgery  Neck and arm pain  Post laminectomy syndrome  Preparation for the injection:  1. Do not eat any solid food or dairy products within 6 hours of your appointment. 2. You may drink clear liquids up to 2 hours before an appointment.  Clear liquids include water, black coffee, juice or soda.  No milk or cream please. 3. You may take your regular  medications, including pain medications, with a sip of water before your appointment.  Diabetics should hold regular insulin (if taken separately) and take 1/2 normal NPH dose the morning of the procedure.  Carry some sugar containing items with you to your appointment. 4. A driver must accompany you and be prepared to drive you home after your procedure. 5. Bring all your current medications with you. 6. An IV may be inserted and sedation may be  given at the discretion of the physician. 7. A blood pressure cuff, EKG, and other monitors will often be applied during the procedure.  Some patients may need to have extra oxygen administered for a short period. 8. You will be asked to provide medical information, including allergies, prior to the procedure.  We must know immediately if you are taking blood  Thinners (like Coumadin) or if you are allergic to IV iodine contrast (dye).  Possible side-effects: All are usually temporary  Bleeding from needle site  Light headedness  Numbness and tingling  Decreased blood pressure  Weakness in arms/legs  Pressure sensation in back/neck  Pain at injection site (several days)  Possible complications: All are extremely rare  Infection  Nerve injury  Spinal headache (a headache wore with upright position)  Call if you experience:  Fever/chills associated with headache or increased back/neck pain  Headache worsened by an upright position  New onset weakness or numbness of an extremity below the injection site  Hives or difficulty breathing (go to the emergency room)  Inflammation or drainage at the injection site(s)  Severe back/neck pain greater than usual  New symptoms which are concerning to you  Please note:  Although the local anesthetic injected can often make your back or neck feel good for several hours after the injection the pain will likely return.  It takes 3-5 days for steroids to work on the nerve root. You may not notice any pain relief for at least one week.  If effective, we will often do a series of 3 injections spaced 3-6 weeks apart to maximally decrease your pain.    If you have any questions, please call [phone] [facility]

## 2015-03-12 NOTE — Progress Notes (Signed)
Subjective:    Patient ID: Claire Mcdonald, female    DOB: [birth year], 49 y.o.   MRN: [id number]  HPI  PROCEDURE PERFORMED: Lumbar facet (medial branch block)   NOTE: The patient is a 49 y.o. female who returns to [place] for further evaluation and treatment of pain involving the lumbar and lower extremity region. MRI  revealed the patient to be with evidence of degenerative changes of the lumbar spine with grade 2 .  anterolisthesis, L5 on S1 with bilateral spondylolisthesis, L5 degenerative changes, L5-S1 with degeneration and signal intensity within the disc material and loss of disc height. There is concern regarding significant component of patient's pain being due to facet degenerative changes with facet syndromeThe risks, benefits, and expectations of the procedure have been discussed and explained to the patient who was understanding and in agreement with suggested treatment plan. We will proceed with interventional treatment as discussed and as explained to the patient who was understanding and wished to proceed with procedure as planned.   DESCRIPTION OF PROCEDURE: Lumbar facet (medial branch block) with IV Versed, IV fentanyl conscious sedation, EKG, blood pressure, pulse, and pulse oximetry monitoring. The procedure was performed with the patient in the prone position. Betadine prep of proposed entry site performed.   NEEDLE PLACEMENT AT:  the left L 3 lumbar facet (medial branch block). Under fluoroscopic guidance with oblique orientation of 15 degrees, a 22-gauge needle was inserted at the L 3 vertebral body level with needle placed at the targeted area of Burton's Eye or Eye of the Scotty Dog with documentation of needle placement in the superior and lateral border of targeted area of Burton's Eye or Eye of the Scotty Dog with oblique orientation of 15 degrees. Following documentation of needle placement at the L  3 vertebral body level, needle placement was then accomplished  at the L  4 vertebral body level.   NEEDLE PLACEMENT AT L4 AND L5 VERTEBRAL BODY LEVELS ON THE LEFT SIDE The procedure was performed at theL4 and L5 vertebral body levels exactly as was performed at the L  3 vertebral body level utilizing the same technique and under fluoroscopic guidance.  NEEDLE PLACEMENT AT THE SACRAL ALA with AP view of the lumbosacral spine. With the patient in the prone position, Betadine prep of proposed entry site accomplished, a 22 gauge needle was inserted in the region of the sacral ala (groove formed by the superior articulating process of S1 and the sacral wing). Following documentation of needle placement at the sacral ala,  needle placement was then accomplished at the S1 foramen level.   NEEDLE PLACEMENT AT THE S1 FORAMEN LEVEL under fluoroscopic guidance with AP view of the lumbosacral spine and cephalad orientation of the fluoroscope, a 22-gauge needle was placed at the superior and lateral border of the S1 foramen under fluoroscopic guidance. Following documentation of needle placement at the S1 foramen.   Needle placement was then verified at all levels on lateral view. Following documentation of needle placement at all levels on lateral view and following negative aspiration for heme and CSF, each level was injected with 1 mL of 0.25% bupivacaine with Kenalog.     LUMBAR FACET, MEDIAL BRANCH NERVE, BLOCKS PERFORMED ON THE RIGHT SIDE   The procedure was performed on the right side exactly as was performed on the left side at the same levels and utilizing the same technique under fluoroscopic guidance.     The patient tolerated the procedure well. A total  of 40 mg of Kenalog was utilized for the procedure.   PLAN:  1. Medications: The patient will continue presently prescribed medications. 2. May consider modification of treatment regimen at time of return appointment pending response to treatment rendered on today's visit. 3. The patient is to follow-up  with primary care physician Dr. Jeananne Rama  for further evaluation of blood pressure and general medical condition status post steroid injection performed on today's visit. 4. Surgical follow-up evaluation. 5. Neurological follow-up evaluation. 6. The patient may be candidate for radiofrequency procedures, implantation type procedures, and other treatment pending response to treatment and follow-up evaluation. 7. The patient has been advised to call the [facility] Center prior to scheduled return appointment should there be significant change in condition or should patient have other concerns regarding condition prior to scheduled return appointment.  The patient is understanding and in agreement with suggested treatment plan.    Review of Systems     Objective:   Physical Exam        Assessment & Plan:

## 2015-03-13 ENCOUNTER — Telehealth: Payer: Self-pay | Admitting: *Deleted

## 2015-03-13 NOTE — Telephone Encounter (Signed)
No problems 

## 2015-03-25 ENCOUNTER — Other Ambulatory Visit: Payer: Self-pay

## 2015-03-25 ENCOUNTER — Emergency Department: Payer: Medicaid Other

## 2015-03-25 ENCOUNTER — Emergency Department
Admission: EM | Admit: 2015-03-25 | Discharge: 2015-03-25 | Disposition: A | Discharge status: Hospice | Payer: Medicaid Other | Attending: Emergency Medicine | Admitting: Emergency Medicine

## 2015-03-25 ENCOUNTER — Encounter (HOSPITAL_COMMUNITY): Payer: Self-pay | Admitting: Internal Medicine

## 2015-03-25 ENCOUNTER — Inpatient Hospital Stay (HOSPITAL_COMMUNITY)
Admission: AD | Admit: 2015-03-25 | Discharge: 2015-04-12 | DRG: 225 | Disposition: A | Discharge status: Home / Self Care | Payer: Medicaid Other | Source: Other Acute Inpatient Hospital | Attending: Internal Medicine | Admitting: Internal Medicine

## 2015-03-25 ENCOUNTER — Inpatient Hospital Stay (HOSPITAL_COMMUNITY): Payer: Medicaid Other

## 2015-03-25 DIAGNOSIS — Z886 Allergy status to analgesic agent status: Secondary | ICD-10-CM | POA: Diagnosis not present

## 2015-03-25 DIAGNOSIS — Z792 Long term (current) use of antibiotics: Secondary | ICD-10-CM | POA: Insufficient documentation

## 2015-03-25 DIAGNOSIS — D649 Anemia, unspecified: Secondary | ICD-10-CM | POA: Diagnosis not present

## 2015-03-25 DIAGNOSIS — I469 Cardiac arrest, cause unspecified: Secondary | ICD-10-CM | POA: Diagnosis present

## 2015-03-25 DIAGNOSIS — I2699 Other pulmonary embolism without acute cor pulmonale: Secondary | ICD-10-CM

## 2015-03-25 DIAGNOSIS — Z6841 Body Mass Index (BMI) 40.0 and over, adult: Secondary | ICD-10-CM | POA: Diagnosis not present

## 2015-03-25 DIAGNOSIS — I4901 Ventricular fibrillation: Principal | ICD-10-CM | POA: Diagnosis present

## 2015-03-25 DIAGNOSIS — R4189 Other symptoms and signs involving cognitive functions and awareness: Secondary | ICD-10-CM

## 2015-03-25 DIAGNOSIS — R911 Solitary pulmonary nodule: Secondary | ICD-10-CM | POA: Diagnosis present

## 2015-03-25 DIAGNOSIS — Z79899 Other long term (current) drug therapy: Secondary | ICD-10-CM | POA: Insufficient documentation

## 2015-03-25 DIAGNOSIS — N390 Urinary tract infection, site not specified: Secondary | ICD-10-CM | POA: Diagnosis not present

## 2015-03-25 DIAGNOSIS — Z0389 Encounter for observation for other suspected diseases and conditions ruled out: Secondary | ICD-10-CM

## 2015-03-25 DIAGNOSIS — E780 Pure hypercholesterolemia: Secondary | ICD-10-CM | POA: Diagnosis not present

## 2015-03-25 DIAGNOSIS — R0681 Apnea, not elsewhere classified: Secondary | ICD-10-CM | POA: Diagnosis present

## 2015-03-25 DIAGNOSIS — G40901 Epilepsy, unspecified, not intractable, with status epilepticus: Secondary | ICD-10-CM | POA: Diagnosis present

## 2015-03-25 DIAGNOSIS — R05 Cough: Secondary | ICD-10-CM

## 2015-03-25 DIAGNOSIS — Z885 Allergy status to narcotic agent status: Secondary | ICD-10-CM | POA: Diagnosis not present

## 2015-03-25 DIAGNOSIS — R7989 Other specified abnormal findings of blood chemistry: Secondary | ICD-10-CM

## 2015-03-25 DIAGNOSIS — G40301 Generalized idiopathic epilepsy and epileptic syndromes, not intractable, with status epilepticus: Secondary | ICD-10-CM | POA: Diagnosis not present

## 2015-03-25 DIAGNOSIS — I82401 Acute embolism and thrombosis of unspecified deep veins of right lower extremity: Secondary | ICD-10-CM | POA: Diagnosis present

## 2015-03-25 DIAGNOSIS — G8929 Other chronic pain: Secondary | ICD-10-CM | POA: Diagnosis present

## 2015-03-25 DIAGNOSIS — G40401 Other generalized epilepsy and epileptic syndromes, not intractable, with status epilepticus: Secondary | ICD-10-CM | POA: Diagnosis not present

## 2015-03-25 DIAGNOSIS — I252 Old myocardial infarction: Secondary | ICD-10-CM

## 2015-03-25 DIAGNOSIS — M549 Dorsalgia, unspecified: Secondary | ICD-10-CM | POA: Diagnosis not present

## 2015-03-25 DIAGNOSIS — J9811 Atelectasis: Secondary | ICD-10-CM | POA: Diagnosis not present

## 2015-03-25 DIAGNOSIS — E669 Obesity, unspecified: Secondary | ICD-10-CM | POA: Diagnosis present

## 2015-03-25 DIAGNOSIS — F329 Major depressive disorder, single episode, unspecified: Secondary | ICD-10-CM | POA: Diagnosis present

## 2015-03-25 DIAGNOSIS — K219 Gastro-esophageal reflux disease without esophagitis: Secondary | ICD-10-CM | POA: Diagnosis not present

## 2015-03-25 DIAGNOSIS — I472 Ventricular tachycardia: Secondary | ICD-10-CM | POA: Diagnosis not present

## 2015-03-25 DIAGNOSIS — Z8674 Personal history of sudden cardiac arrest: Secondary | ICD-10-CM | POA: Insufficient documentation

## 2015-03-25 DIAGNOSIS — F418 Other specified anxiety disorders: Secondary | ICD-10-CM | POA: Diagnosis present

## 2015-03-25 DIAGNOSIS — R0600 Dyspnea, unspecified: Secondary | ICD-10-CM | POA: Diagnosis not present

## 2015-03-25 DIAGNOSIS — I10 Essential (primary) hypertension: Secondary | ICD-10-CM | POA: Insufficient documentation

## 2015-03-25 DIAGNOSIS — E785 Hyperlipidemia, unspecified: Secondary | ICD-10-CM | POA: Diagnosis present

## 2015-03-25 DIAGNOSIS — Z8249 Family history of ischemic heart disease and other diseases of the circulatory system: Secondary | ICD-10-CM

## 2015-03-25 DIAGNOSIS — R519 Headache, unspecified: Secondary | ICD-10-CM

## 2015-03-25 DIAGNOSIS — I462 Cardiac arrest due to underlying cardiac condition: Secondary | ICD-10-CM | POA: Diagnosis not present

## 2015-03-25 DIAGNOSIS — Z888 Allergy status to other drugs, medicaments and biological substances status: Secondary | ICD-10-CM | POA: Diagnosis not present

## 2015-03-25 DIAGNOSIS — R059 Cough, unspecified: Secondary | ICD-10-CM

## 2015-03-25 DIAGNOSIS — R569 Unspecified convulsions: Secondary | ICD-10-CM | POA: Diagnosis not present

## 2015-03-25 DIAGNOSIS — R51 Headache: Secondary | ICD-10-CM

## 2015-03-25 DIAGNOSIS — I82409 Acute embolism and thrombosis of unspecified deep veins of unspecified lower extremity: Secondary | ICD-10-CM | POA: Diagnosis present

## 2015-03-25 DIAGNOSIS — Z452 Encounter for adjustment and management of vascular access device: Secondary | ICD-10-CM

## 2015-03-25 DIAGNOSIS — Z7951 Long term (current) use of inhaled steroids: Secondary | ICD-10-CM | POA: Diagnosis not present

## 2015-03-25 DIAGNOSIS — E876 Hypokalemia: Secondary | ICD-10-CM | POA: Diagnosis present

## 2015-03-25 DIAGNOSIS — Z7901 Long term (current) use of anticoagulants: Secondary | ICD-10-CM

## 2015-03-25 DIAGNOSIS — E162 Hypoglycemia, unspecified: Secondary | ICD-10-CM | POA: Diagnosis not present

## 2015-03-25 DIAGNOSIS — I2510 Atherosclerotic heart disease of native coronary artery without angina pectoris: Secondary | ICD-10-CM | POA: Diagnosis not present

## 2015-03-25 DIAGNOSIS — R778 Other specified abnormalities of plasma proteins: Secondary | ICD-10-CM | POA: Diagnosis present

## 2015-03-25 DIAGNOSIS — IMO0001 Reserved for inherently not codable concepts without codable children: Secondary | ICD-10-CM

## 2015-03-25 HISTORY — DX: Migraine, unspecified, not intractable, without status migrainosus: G43.909

## 2015-03-25 HISTORY — DX: Diverticulosis of intestine, part unspecified, without perforation or abscess without bleeding: K57.90

## 2015-03-25 HISTORY — DX: Acute embolism and thrombosis of unspecified deep veins of unspecified lower extremity: I82.409

## 2015-03-25 HISTORY — DX: Obesity, unspecified: E66.9

## 2015-03-25 HISTORY — DX: Gastro-esophageal reflux disease without esophagitis: K21.9

## 2015-03-25 HISTORY — PX: CENTRAL LINE: PRO85

## 2015-03-25 LAB — BASIC METABOLIC PANEL
Anion gap: 12 (ref 5–15)
BUN: 15 mg/dL (ref 6–20)
CALCIUM: 8.9 mg/dL (ref 8.9–10.3)
CO2: 21 mmol/L — ABNORMAL LOW (ref 22–32)
Chloride: 108 mmol/L (ref 101–111)
Creatinine, Ser: 0.84 mg/dL (ref 0.44–1.00)
GFR calc Af Amer: >60 mL/min (ref >60)
Glucose, Bld: 85 mg/dL (ref 65–99)
Potassium: 5.1 mmol/L (ref 3.5–5.1)
SODIUM: 141 mmol/L (ref 135–145)

## 2015-03-25 LAB — URINE DRUG SCREEN, QUALITATIVE (ARMC ONLY)
Amphetamines, Ur Screen: NOT DETECTED
Barbiturates, Ur Screen: NOT DETECTED
Benzodiazepine, Ur Scrn: POSITIVE — AB
COCAINE METABOLITE, UR ~~LOC~~: NOT DETECTED
Cannabinoid 50 Ng, Ur ~~LOC~~: NOT DETECTED
MDMA (ECSTASY) UR SCREEN: NOT DETECTED
Methadone Scn, Ur: NOT DETECTED
Opiate, Ur Screen: NOT DETECTED
Phencyclidine (PCP) Ur S: NOT DETECTED
TRICYCLIC, UR SCREEN: NOT DETECTED

## 2015-03-25 LAB — BLOOD GAS, ARTERIAL
ACID-BASE DEFICIT: 5.8 mmol/L — AB (ref 0.0–2.0)
Acid-base deficit: 5.1 mmol/L — ABNORMAL HIGH (ref 0.0–2.0)
BICARBONATE: 19.3 meq/L — AB (ref 20.0–24.0)
Bicarbonate: 20.1 mEq/L — ABNORMAL LOW (ref 21.0–28.0)
Drawn by: 44166
FIO2: 0.4
FIO2: 0.4
MECHVT: 420 mL
O2 SAT: 94.9 %
O2 SAT: 99.3 %
PEEP: 5 cmH2O
PEEP: 5 cmH2O
Patient temperature: 37
Patient temperature: 99.5
RATE: 14 resp/min
RATE: 14 resp/min
TCO2: 20.4 mmol/L (ref 0–100)
pCO2 arterial: 40 mmHg (ref 32.0–48.0)
pCO2 arterial: 35.7 mmHg (ref 35.0–45.0)
pH, Arterial: 7.31 — ABNORMAL LOW (ref 7.350–7.450)
pH, Arterial: 7.356 (ref 7.350–7.450)
pO2, Arterial: 82 mmHg — ABNORMAL LOW (ref 83.0–108.0)
pO2, Arterial: 194 mmHg — ABNORMAL HIGH (ref 80.0–100.0)

## 2015-03-25 LAB — COMPREHENSIVE METABOLIC PANEL
ALT: 86 U/L — AB (ref 14–54)
ALT: 71 U/L — ABNORMAL HIGH (ref 14–54)
ANION GAP: 7 (ref 5–15)
AST: 112 U/L — ABNORMAL HIGH (ref 15–41)
AST: 113 U/L — ABNORMAL HIGH (ref 15–41)
Albumin: 3.8 g/dL (ref 3.5–5.0)
Albumin: 2.9 g/dL — ABNORMAL LOW (ref 3.5–5.0)
Alkaline Phosphatase: 55 U/L (ref 38–126)
Alkaline Phosphatase: 43 U/L (ref 38–126)
Anion gap: 9 (ref 5–15)
BUN: 16 mg/dL (ref 6–20)
BUN: 16 mg/dL (ref 6–20)
CALCIUM: 7.8 mg/dL — AB (ref 8.9–10.3)
CO2: 21 mmol/L — AB (ref 22–32)
CO2: 22 mmol/L (ref 22–32)
CREATININE: 0.7 mg/dL (ref 0.44–1.00)
Calcium: 8.5 mg/dL — ABNORMAL LOW (ref 8.9–10.3)
Chloride: 110 mmol/L (ref 101–111)
Chloride: 110 mmol/L (ref 101–111)
Creatinine, Ser: 0.99 mg/dL (ref 0.44–1.00)
GFR calc Af Amer: >60 mL/min (ref >60)
GFR calc non Af Amer: >60 mL/min (ref >60)
GLUCOSE: 193 mg/dL — AB (ref 65–99)
Glucose, Bld: 108 mg/dL — ABNORMAL HIGH (ref 65–99)
POTASSIUM: 3.7 mmol/L (ref 3.5–5.1)
Potassium: 3.1 mmol/L — ABNORMAL LOW (ref 3.5–5.1)
Sodium: 140 mmol/L (ref 135–145)
Sodium: 139 mmol/L (ref 135–145)
Total Bilirubin: 0.8 mg/dL (ref 0.3–1.2)
Total Bilirubin: 0.8 mg/dL (ref 0.3–1.2)
Total Protein: 6.7 g/dL (ref 6.5–8.1)
Total Protein: 5.3 g/dL — ABNORMAL LOW (ref 6.5–8.1)

## 2015-03-25 LAB — CBC WITH DIFFERENTIAL/PLATELET
BASOS ABS: 0.1 10*3/uL (ref 0–0.1)
BASOS PCT: 1 %
EOS PCT: 2 %
Eosinophils Absolute: 0.2 10*3/uL (ref 0–0.7)
HCT: 44.3 % (ref 35.0–47.0)
HEMOGLOBIN: 14.4 g/dL (ref 12.0–16.0)
Lymphocytes Relative: 27 %
Lymphs Abs: 2.4 10*3/uL (ref 1.0–3.6)
MCH: 30.2 pg (ref 26.0–34.0)
MCHC: 32.6 g/dL (ref 32.0–36.0)
MCV: 92.5 fL (ref 80.0–100.0)
MONOS PCT: 6 %
Monocytes Absolute: 0.5 10*3/uL (ref 0.2–0.9)
NEUTROS ABS: 5.7 10*3/uL (ref 1.4–6.5)
Neutrophils Relative %: 64 %
Platelets: 283 10*3/uL (ref 150–440)
RBC: 4.79 MIL/uL (ref 3.80–5.20)
RDW: 14.1 % (ref 11.5–14.5)
WBC: 8.8 10*3/uL (ref 3.6–11.0)

## 2015-03-25 LAB — URINALYSIS COMPLETE WITH MICROSCOPIC (ARMC ONLY)
Bilirubin Urine: NEGATIVE
Glucose, UA: 50 mg/dL — AB
Hgb urine dipstick: NEGATIVE
Ketones, ur: NEGATIVE mg/dL
LEUKOCYTES UA: NEGATIVE
Nitrite: NEGATIVE
PH: 5 (ref 5.0–8.0)
PROTEIN: 100 mg/dL — AB
Specific Gravity, Urine: 1.023 (ref 1.005–1.030)

## 2015-03-25 LAB — PROTIME-INR
INR: 1.02 (ref 0.00–1.49)
PROTHROMBIN TIME: 13.6 s (ref 11.6–15.2)

## 2015-03-25 LAB — PHOSPHORUS: PHOSPHORUS: 3.8 mg/dL (ref 2.5–4.6)

## 2015-03-25 LAB — CBC
HEMATOCRIT: 48.7 % — AB (ref 36.0–46.0)
Hemoglobin: 16.2 g/dL — ABNORMAL HIGH (ref 12.0–15.0)
MCH: 30.9 pg (ref 26.0–34.0)
MCHC: 33.3 g/dL (ref 30.0–36.0)
MCV: 92.9 fL (ref 78.0–100.0)
Platelets: 310 10*3/uL (ref 150–400)
RBC: 5.24 MIL/uL — ABNORMAL HIGH (ref 3.87–5.11)
RDW: 13.9 % (ref 11.5–15.5)
WBC: 16.9 10*3/uL — ABNORMAL HIGH (ref 4.0–10.5)

## 2015-03-25 LAB — TRIGLYCERIDES: Triglycerides: 133 mg/dL (ref <150)

## 2015-03-25 LAB — PROCALCITONIN: Procalcitonin: <0.1 ng/mL

## 2015-03-25 LAB — MRSA PCR SCREENING: MRSA by PCR: NEGATIVE

## 2015-03-25 LAB — APTT: aPTT: 25 seconds (ref 24–37)

## 2015-03-25 LAB — GLUCOSE, CAPILLARY: GLUCOSE-CAPILLARY: 105 mg/dL — AB (ref 65–99)

## 2015-03-25 LAB — HCG, SERUM, QUALITATIVE: PREG SERUM: NEGATIVE

## 2015-03-25 LAB — BRAIN NATRIURETIC PEPTIDE: B Natriuretic Peptide: 146.1 pg/mL — ABNORMAL HIGH (ref 0.0–100.0)

## 2015-03-25 LAB — MAGNESIUM: Magnesium: 2.5 mg/dL — ABNORMAL HIGH (ref 1.7–2.4)

## 2015-03-25 LAB — ETHANOL: Alcohol, Ethyl (B): <5 mg/dL (ref <5)

## 2015-03-25 LAB — LACTIC ACID, PLASMA: LACTIC ACID, VENOUS: 3.1 mmol/L — AB (ref 0.5–2.0)

## 2015-03-25 LAB — TROPONIN I
Troponin I: 0.57 ng/mL — ABNORMAL HIGH (ref <0.031)
Troponin I: 2.76 ng/mL (ref <0.031)

## 2015-03-25 MED ORDER — PROPOFOL 1000 MG/100ML IV EMUL
0.0000 ug/kg/min | INTRAVENOUS | Status: DC
  Administered 2015-03-25: 20 ug/kg/min via INTRAVENOUS
  Administered 2015-03-26: 25 ug/kg/min via INTRAVENOUS
  Administered 2015-03-26: 30 ug/kg/min via INTRAVENOUS
  Administered 2015-03-26 – 2015-03-27 (×2): 20 ug/kg/min via INTRAVENOUS
  Administered 2015-03-27 – 2015-03-28 (×4): 30 ug/kg/min via INTRAVENOUS
  Administered 2015-03-28: 25 ug/kg/min via INTRAVENOUS
  Administered 2015-03-28: 20 ug/kg/min via INTRAVENOUS
  Administered 2015-03-29 (×2): 40 ug/kg/min via INTRAVENOUS
  Filled 2015-03-25 (×10): qty 100
  Filled 2015-03-25: qty 1000
  Filled 2015-03-25: qty 100

## 2015-03-25 MED ORDER — ALBUTEROL SULFATE (2.5 MG/3ML) 0.083% IN NEBU
2.5000 mg | INHALATION_SOLUTION | RESPIRATORY_TRACT | Status: DC
  Administered 2015-03-25 – 2015-04-01 (×37): 2.5 mg via RESPIRATORY_TRACT
  Filled 2015-03-25 (×38): qty 3

## 2015-03-25 MED ORDER — SENNOSIDES 8.8 MG/5ML PO SYRP
5.0000 mL | ORAL_SOLUTION | Freq: Two times a day (BID) | ORAL | Status: DC | PRN
  Filled 2015-03-25: qty 5

## 2015-03-25 MED ORDER — FENTANYL CITRATE (PF) 100 MCG/2ML IJ SOLN
100.0000 ug | INTRAMUSCULAR | Status: DC | PRN
  Filled 2015-03-25: qty 2

## 2015-03-25 MED ORDER — FENTANYL CITRATE (PF) 100 MCG/2ML IJ SOLN
100.0000 ug | INTRAMUSCULAR | Status: DC | PRN
  Administered 2015-03-26: 50 ug via INTRAVENOUS
  Administered 2015-03-26: 100 ug via INTRAVENOUS
  Administered 2015-03-27: 50 ug via INTRAVENOUS
  Administered 2015-03-27 – 2015-03-28 (×2): 100 ug via INTRAVENOUS
  Filled 2015-03-25 (×4): qty 2

## 2015-03-25 MED ORDER — LEVETIRACETAM 500 MG/5ML IV SOLN
1500.0000 mg | Freq: Once | INTRAVENOUS | Status: AC
  Administered 2015-03-25: 1500 mg via INTRAVENOUS
  Filled 2015-03-25: qty 15

## 2015-03-25 MED ORDER — DEXTROSE 50 % IV SOLN
25.0000 mL | Freq: Once | INTRAVENOUS | Status: AC
  Administered 2015-03-26: 25 mL via INTRAVENOUS
  Filled 2015-03-25: qty 50

## 2015-03-25 MED ORDER — SODIUM CHLORIDE 0.9 % IV SOLN
500.0000 mg | Freq: Two times a day (BID) | INTRAVENOUS | Status: DC
  Administered 2015-03-26 – 2015-03-31 (×11): 500 mg via INTRAVENOUS
  Filled 2015-03-25 (×13): qty 5

## 2015-03-25 MED ORDER — MIDAZOLAM HCL 2 MG/2ML IJ SOLN
4.0000 mg | Freq: Once | INTRAMUSCULAR | Status: AC
  Administered 2015-03-25: 4 mg via INTRAVENOUS

## 2015-03-25 MED ORDER — PROPOFOL 1000 MG/100ML IV EMUL
5.0000 ug/kg/min | Freq: Once | INTRAVENOUS | Status: AC
Start: 2015-03-25 — End: 2015-03-25
  Administered 2015-03-25: 5 ug/kg/min via INTRAVENOUS
  Filled 2015-03-25: qty 100

## 2015-03-25 MED ORDER — LORAZEPAM 2 MG/ML IJ SOLN
2.0000 mg | Freq: Once | INTRAMUSCULAR | Status: AC
  Administered 2015-03-25: 15:00:00 via INTRAVENOUS

## 2015-03-25 MED ORDER — HEPARIN SODIUM (PORCINE) 5000 UNIT/ML IJ SOLN
5000.0000 [IU] | Freq: Three times a day (TID) | INTRAMUSCULAR | Status: DC
  Administered 2015-03-25: 5000 [IU] via SUBCUTANEOUS
  Filled 2015-03-25: qty 1

## 2015-03-25 MED ORDER — HEPARIN SODIUM (PORCINE) 5000 UNIT/ML IJ SOLN: 5000.0000 [IU] | Freq: Three times a day (TID) | INTRAMUSCULAR | Status: DC

## 2015-03-25 MED ORDER — LORAZEPAM 2 MG/ML IJ SOLN
INTRAMUSCULAR | Status: AC
  Filled 2015-03-25: qty 1

## 2015-03-25 MED ORDER — FENTANYL CITRATE (PF) 100 MCG/2ML IJ SOLN
INTRAMUSCULAR | Status: AC
  Filled 2015-03-25: qty 2

## 2015-03-25 MED ORDER — SODIUM CHLORIDE 0.9 % IV SOLN
1.0000 mg/h | INTRAVENOUS | Status: DC
  Filled 2015-03-25: qty 10

## 2015-03-25 MED ORDER — ACETAMINOPHEN 325 MG PO TABS
650.0000 mg | ORAL_TABLET | ORAL | Status: DC | PRN
  Administered 2015-03-26: 650 mg via ORAL
  Filled 2015-03-25: qty 2

## 2015-03-25 MED ORDER — SODIUM CHLORIDE 0.9 % IV SOLN: 250.0000 mL | INTRAVENOUS | Status: DC | PRN

## 2015-03-25 MED ORDER — FAMOTIDINE IN NACL 20-0.9 MG/50ML-% IV SOLN
20.0000 mg | Freq: Two times a day (BID) | INTRAVENOUS | Status: DC
  Administered 2015-03-25 – 2015-03-29 (×9): 20 mg via INTRAVENOUS
  Filled 2015-03-25 (×11): qty 50

## 2015-03-25 MED ORDER — NOREPINEPHRINE BITARTRATE 1 MG/ML IV SOLN
0.0000 ug/min | INTRAVENOUS | Status: DC
  Filled 2015-03-25: qty 4

## 2015-03-25 MED ORDER — ETOMIDATE 2 MG/ML IV SOLN
20.0000 mg | Freq: Once | INTRAVENOUS | Status: AC
  Administered 2015-03-25: 20 mg via INTRAVENOUS

## 2015-03-25 MED ORDER — FENTANYL CITRATE (PF) 100 MCG/2ML IJ SOLN
50.0000 ug | Freq: Once | INTRAMUSCULAR | Status: AC
  Administered 2015-03-25: 50 ug via INTRAVENOUS

## 2015-03-25 MED ORDER — SUCCINYLCHOLINE CHLORIDE 20 MG/ML IJ SOLN
120.0000 mg | Freq: Once | INTRAMUSCULAR | Status: AC
  Administered 2015-03-25: 120 mg via INTRAVENOUS

## 2015-03-25 MED ORDER — LORAZEPAM 2 MG/ML IJ SOLN
2.0000 mg | Freq: Once | INTRAMUSCULAR | Status: AC
  Administered 2015-03-25: 14:00:00 via INTRAVENOUS

## 2015-03-25 MED ORDER — BISACODYL 10 MG RE SUPP: 10.0000 mg | Freq: Every day | RECTAL | Status: DC | PRN

## 2015-03-25 NOTE — H&P (Signed)
PULMONARY / CRITICAL CARE MEDICINE   Name: Claire Mcdonald MRN: [id number] DOB: 02/20/1966    ADMISSION DATE:  03/25/2015  REFERRING MD :  [facility]  CHIEF COMPLAINT:  V-Fib/V-tach cardiac arrest and status epilepticus  INITIAL PRESENTATION: Noted by coworkers at [facility] to be having a seizure  STUDIES:  7/31 Head CT - no acute findings 7/31 Neck CT - no acute findings  SIGNIFICANT EVENTS: Appeared to be having seizure like activity on admission.  Neurology consulted.  Movements stopped on their own.    HISTORY OF PRESENT ILLNESS:  Claire Mcdonald is 49 y/o woman with past medical history of hypertension, chronic pain and depression who is on gabapentin and prn topomax.  She was working at [employer] this evening when her coworkers called EMS to report seizure like activity.  When EMS arrived she was placed on an AED and shock was advised.  She was shocked twice and a King airway placed.  She was noted to again have seizure like activity in the ambulance and in the ED at [facility].  She was treated with ativan and keppra.  She was transferred to [facility] for cooling after out of hospital arrest.  On arrival to [facility] she was having abnormal movements.  She had disconjungate gaze and would intermittently withdraw both legs, either spontaneously or in response to pain.  Propofol was held with no change in movements.  After about 45 minutes and after receiving 55mcg fentanyl for central line placement the movements ceased and the patient began to open her name to voice.  She later would follow commands such as squeezing hands and wiggling toes.  As she was already at [facility] it was decided to continue with the 36C cooling protocol.   PAST MEDICAL HISTORY :   has a past medical history of Hypertension; High cholesterol; Anxiety; Depression; and Allergy.  has past surgical history that includes Tubal ligation (1998) and Ovarian cyst removal (Left). Prior to Admission medications   Medication Sig Start  Date End Date Taking? Authorizing Provider  acetaminophen (TYLENOL) 500 MG tablet Take 500 mg by mouth every 6 (six) hours as needed for pain.    Historical Provider, MD  albuterol (PROVENTIL HFA;VENTOLIN HFA) 108 (90 BASE) MCG/ACT inhaler Inhale 2 puffs into the lungs every 6 (six) hours as needed for wheezing or shortness of breath. Patient not taking: Reported on 03/12/2015 01/23/15   Dannielle Karvonen Menshew, PA-C  amoxicillin (AMOXIL) 875 MG tablet Take 1 tablet (875 mg total) by mouth 2 (two) times daily. Patient not taking: Reported on 03/12/2015 01/26/15   Guadalupe Maple, MD  DULoxetine (CYMBALTA) 60 MG capsule Take 60 mg by mouth daily.    Historical Provider, MD  fluticasone (FLONASE) 50 MCG/ACT nasal spray Place 1 spray into both nostrils daily. 01/23/15   Jenise V Bacon Menshew, PA-C  furosemide (LASIX) 40 MG tablet Take 40 mg by mouth daily.    Historical Provider, MD  gabapentin (NEURONTIN) 300 MG capsule Take 300 mg by mouth as needed.     Historical Provider, MD  HYDROcodone-acetaminophen (NORCO/VICODIN) 5-325 MG per tablet Take 2 tablets by mouth every 4 (four) hours as needed for pain. Patient not taking: Reported on 03/12/2015 12/29/12   Noemi Chapel, MD  metoCLOPramide (REGLAN) 5 MG tablet Take 1 tablet (5 mg total) by mouth every 6 (six) hours as needed for nausea. Patient not taking: Reported on 03/12/2015 01/23/15 01/23/16  Dannielle Karvonen Menshew, PA-C  metoprolol succinate (TOPROL-XL) 100  MG 24 hr tablet Take 100 mg by mouth daily. Take with or immediately following a meal.    Historical Provider, MD  naproxen (NAPROSYN) 500 MG tablet Take 1 tablet (500 mg total) by mouth 2 (two) times daily with a meal. Patient not taking: Reported on 03/12/2015 12/30/14   Carrie Mew, MD  promethazine (PHENERGAN) 25 MG tablet Take 1 tablet (25 mg total) by mouth every 6 (six) hours as needed for nausea. Patient not taking: Reported on 03/12/2015 12/26/12   Gertha Calkin, PA-C  topiramate  (TOPAMAX) 100 MG tablet Take 100 mg by mouth 2 (two) times daily.    Historical Provider, MD   Allergies  Allergen Reactions  . Toradol [Ketorolac Tromethamine] Itching and Rash  . Tramadol Itching and Rash  . Zofran [Ondansetron Hcl] Itching and Rash    FAMILY HISTORY:  indicated that her mother is alive. She indicated that her father is alive. Family history of hypertension and heart disease.  No family history of seizures.  SOCIAL HISTORY:  reports that she has never smoked. She has never used smokeless tobacco. She reports that she does not drink alcohol or use illicit drugs.  REVIEW OF SYSTEMS:  Could not be obtained as patient is intubated and sedated.  SUBJECTIVE:   VITAL SIGNS: Temp:  [95.8 F (35.4 C)-98 F (36.7 C)] 95.8 F (35.4 C) (07/31 1741) Pulse Rate:  [56-96] 56 (07/31 2047) Resp:  [11-21] 14 (07/31 2047) BP: (92-155)/(42-110) 122/92 mmHg (07/31 2047) SpO2:  [95 %-100 %] 100 % (07/31 2047) FiO2 (%):  [40 %] 40 % (07/31 2047) Weight:  [88.451 kg (195 lb)] 88.451 kg (195 lb) (07/31 1425) HEMODYNAMICS:   VENTILATOR SETTINGS: Vent Mode:  [-] PRVC FiO2 (%):  [40 %] 40 % Set Rate:  [14 bmp] 14 bmp Vt Set:  [420 mL-500 mL] 420 mL PEEP:  [5 cmH20] 5 cmH20 Plateau Pressure:  [16 cmH20] 16 cmH20 INTAKE / OUTPUT: No intake or output data in the 24 hours ending 03/25/15 2103  PHYSICAL EXAMINATION: General:  Obese woman in bed. Intubated. Neuro: Opens eyes to voice when sedation held, grimaces to pain. HEENT:  PERRL, EOMI, NCAT Cardiovascular:  Bradycardic, regular rhythm, no MRG Lungs:  CTAB, no WRR, no chest wall trauma noted.  Abdomen:  Abdomen obese, + BS, no organomegaly appreciated.  Musculoskeletal:  No edema Skin:  No rashes, bruises or other lesions.   LABS:  CBC  Recent Labs Lab 03/25/15 1435 03/25/15 1917  WBC 8.8 16.9*  HGB 14.4 16.2*  HCT 44.3 48.7*  PLT 283 310   Coag's No results for input(s): APTT, INR in the last 168  hours. BMET  Recent Labs Lab 03/25/15 1435  NA 140  K 3.7  CL 110  CO2 21*  BUN 16  CREATININE 0.99  GLUCOSE 193*   Electrolytes  Recent Labs Lab 03/25/15 1435  CALCIUM 8.5*   Sepsis Markers No results for input(s): LATICACIDVEN, PROCALCITON, O2SATVEN in the last 168 hours. ABG  Recent Labs Lab 03/25/15 1612  PHART 7.31*  PCO2ART 40  PO2ART 82*   Liver Enzymes  Recent Labs Lab 03/25/15 1435  AST 112*  ALT 86*  ALKPHOS 55  BILITOT 0.8  ALBUMIN 3.8   Cardiac Enzymes  Recent Labs Lab 03/25/15 1435  TROPONINI 0.57*   Glucose No results for input(s): GLUCAP in the last 168 hours.  Imaging Dg Abd 1 View  03/25/2015   CLINICAL DATA:  OG tube placement, cardiac arrest  EXAM: ABDOMEN -  1 VIEW  COMPARISON:  CT abdomen pelvis dated 12/30/2014  FINDINGS: Enteric tube is looped in the stomach and terminates in the gastric cardia.  Nonobstructive bowel gas pattern.  Defibrillator pads overlie the lower chest/upper abdomen.  IMPRESSION: Enteric tube is looped in the stomach and terminates in the gastric cardia.   Electronically Signed   By: Julian Hy M.D.   On: 03/25/2015 16:10   Ct Head Wo Contrast  03/25/2015   CLINICAL DATA:  Found on floor at work.  Unresponsive.  EXAM: CT HEAD WITHOUT CONTRAST  CT CERVICAL SPINE WITHOUT CONTRAST  TECHNIQUE: Multidetector CT imaging of the head and cervical spine was performed following the standard protocol without intravenous contrast. Multiplanar CT image reconstructions of the cervical spine were also generated.  COMPARISON:  10/27/2012  FINDINGS: CT HEAD FINDINGS  No acute intracranial abnormality. Specifically, no hemorrhage, hydrocephalus, mass lesion, acute infarction, or significant intracranial injury. No acute calvarial abnormality. Visualized paranasal sinuses and mastoids clear. Orbital soft tissues unremarkable.  CT CERVICAL SPINE FINDINGS  Motion artifact degrades image quality. No fracture visualized. No  malalignment. Prevertebral soft tissues are normal. No epidural or paraspinal hematoma.  IMPRESSION: No intracranial abnormality.  No acute bony abnormality in the cervical spine.   Electronically Signed   By: Rolm Baptise M.D.   On: 03/25/2015 15:06   Ct Cervical Spine Wo Contrast  03/25/2015   CLINICAL DATA:  Found on floor at work.  Unresponsive.  EXAM: CT HEAD WITHOUT CONTRAST  CT CERVICAL SPINE WITHOUT CONTRAST  TECHNIQUE: Multidetector CT imaging of the head and cervical spine was performed following the standard protocol without intravenous contrast. Multiplanar CT image reconstructions of the cervical spine were also generated.  COMPARISON:  10/27/2012  FINDINGS: CT HEAD FINDINGS  No acute intracranial abnormality. Specifically, no hemorrhage, hydrocephalus, mass lesion, acute infarction, or significant intracranial injury. No acute calvarial abnormality. Visualized paranasal sinuses and mastoids clear. Orbital soft tissues unremarkable.  CT CERVICAL SPINE FINDINGS  Motion artifact degrades image quality. No fracture visualized. No malalignment. Prevertebral soft tissues are normal. No epidural or paraspinal hematoma.  IMPRESSION: No intracranial abnormality.  No acute bony abnormality in the cervical spine.   Electronically Signed   By: Rolm Baptise M.D.   On: 03/25/2015 15:06   Dg Chest Port 1 View  03/25/2015   CLINICAL DATA:  Intubation, cardiac arrest.  EXAM: PORTABLE CHEST - 1 VIEW  COMPARISON:  01/23/2015  FINDINGS: Endotracheal tube has tip in the origin of the left mainstem bronchus. This could be pulled back approximately 3 cm. Nasogastric tube is coiled once over the stomach with tip and side-port over the stomach in the left upper quadrant.  Lungs are hypoinflated without focal consolidation or effusion. Cardiomediastinal silhouette is within normal. Remainder of the exam is unchanged.  IMPRESSION: Hypoinflation without acute cardiopulmonary disease.  Tubes and lines as described. Note that  the endotracheal tube has tip over the origin of the left mainstem bronchus.  These results were called by telephone at the time of interpretation on 03/25/2015 at 4:03 pm to Dr. Thomasene Lot, who verbally acknowledged these results.   Electronically Signed   By: Marin Olp M.D.   On: 03/25/2015 16:03     ASSESSMENT / PLAN:  PULMONARY OETT 7.5, 19cm at the lip, placed in ED A: Intubated for airway protection and apnea post arrest P:   Lung protective ventilation, wean as tolerated. Daily SBT  CARDIOVASCULAR CVL Left subclavian placed 7/31 A: S/P v-fib vs. V-tach arrest  P:  36C cooling protocol Continue monitoring Trend troponin - elevation likely secondary to shock - if continues to rise will start heparin gtt Complete echo in AM  RENAL A:  No acute issues P:   Follow electrolytes  GASTROINTESTINAL A:  No acute issues P:   OG tube in place Bowel regimen  HEMATOLOGIC A:  No acute issues P:  On prophylactic heparin   INFECTIOUS A:  No current signs of infection P:   Monitor for signs and sx of infection BCx2  UC  Sputum   ENDOCRINE A:  No current issues   P:   Monitor blood sugar on daily BMP  NEUROLOGIC A:  Seizure - status epilepticus P:   RASS goal: -1-0 Loaded with Keppra in ED, currently on propofol Neurology consulted - they will follow Will get EEG upon warming   FAMILY  - Updates: Sons updated in room   - Inter-disciplinary family meet or Palliative Care meeting due by:  8/7    TODAY'S SUMMARY: Admitted after v-fib/v-tach arrest and status epilepticus.  Being cooled.  Has had some purposeful movement when sedation was held.      Reginia Forts MD, PhD Pulmonary and [place] Pager: [phone]  03/25/2015, 9:03 PM

## 2015-03-25 NOTE — Consult Note (Addendum)
Admission H&P    Chief Complaint: Cardiac arrest with possible associated seizure activity.  HPI: Claire Mcdonald is an 49 y.o. female history of hypertension, hyperlipidemia, depression and anxiety, brought to the emergency room and [facility] following an episode of loss of consciousness at work with possible seizure activity. Patient was thought to be an atrial fibrillation and was defibrillated. She had palpable pulse at the time EMS arrived but respirations were an adequate. King airway was placed. She was subsequently intubated and placed on mechanical ventilation. She had posturing-like movements and possible seizure activity after arriving in the emergency room and en route to [facility]. She was given Ativan and subsequently given a loading dose of Keppra. She was also started on propofol drip. She continued to have episodes of intermittent stiffness but no clear convulsions. He has no history of seizure disorder. She's been taking Topamax 100 mg twice a and Neurontin 300 mg on an as-needed basis, presumably for depression and pain.  Past Medical History  Diagnosis Date  . Hypertension   . High cholesterol   . Anxiety   . Depression   . Allergy     Past Surgical History  Procedure Laterality Date  . Tubal ligation  1998  . Ovarian cyst removal Left     Family History  Problem Relation Age of Onset  . Diabetes Father   . Asthma Father   . Hyperlipidemia Father   . Hypertension Father   . Kidney disease Father   . Heart disease Neg Hx   . Stroke Neg Hx   . Asthma Mother   . Depression Mother   . Hyperlipidemia Mother   . Hypertension Mother   . Cancer Maternal Grandmother   . Early death Maternal Grandmother   . Early death Paternal Grandmother    Social History:  reports that she has never smoked. She has never used smokeless tobacco. She reports that she does not drink alcohol or use illicit drugs.  Allergies:  Allergies  Allergen Reactions  . Toradol  [Ketorolac Tromethamine] Itching and Rash  . Tramadol Itching and Rash  . Zofran [Ondansetron Hcl] Itching and Rash    Medications Prior to Admission  Medication Sig Dispense Refill  . acetaminophen (TYLENOL) 500 MG tablet Take 500 mg by mouth every 6 (six) hours as needed for pain.    Marland Kitchen albuterol (PROVENTIL HFA;VENTOLIN HFA) 108 (90 BASE) MCG/ACT inhaler Inhale 2 puffs into the lungs every 6 (six) hours as needed for wheezing or shortness of breath. (Patient not taking: Reported on 03/12/2015) 1 Inhaler 0  . amoxicillin (AMOXIL) 875 MG tablet Take 1 tablet (875 mg total) by mouth 2 (two) times daily. (Patient not taking: Reported on 03/12/2015) 20 tablet 0  . DULoxetine (CYMBALTA) 60 MG capsule Take 60 mg by mouth daily.    . fluticasone (FLONASE) 50 MCG/ACT nasal spray Place 1 spray into both nostrils daily. 16 g 0  . furosemide (LASIX) 40 MG tablet Take 40 mg by mouth daily.    Marland Kitchen gabapentin (NEURONTIN) 300 MG capsule Take 300 mg by mouth as needed.     Marland Kitchen HYDROcodone-acetaminophen (NORCO/VICODIN) 5-325 MG per tablet Take 2 tablets by mouth every 4 (four) hours as needed for pain. (Patient not taking: Reported on 03/12/2015) 10 tablet 0  . metoCLOPramide (REGLAN) 5 MG tablet Take 1 tablet (5 mg total) by mouth every 6 (six) hours as needed for nausea. (Patient not taking: Reported on 03/12/2015) 10 tablet 0  . metoprolol  succinate (TOPROL-XL) 100 MG 24 hr tablet Take 100 mg by mouth daily. Take with or immediately following a meal.    . naproxen (NAPROSYN) 500 MG tablet Take 1 tablet (500 mg total) by mouth 2 (two) times daily with a meal. (Patient not taking: Reported on 03/12/2015) 20 tablet 0  . promethazine (PHENERGAN) 25 MG tablet Take 1 tablet (25 mg total) by mouth every 6 (six) hours as needed for nausea. (Patient not taking: Reported on 03/12/2015) 20 tablet 0  . topiramate (TOPAMAX) 100 MG tablet Take 100 mg by mouth 2 (two) times daily.      ROS: Unavailable due to hyperventilation  unresponsiveness.  Physical Examination: Blood pressure 122/92, pulse 56, resp. rate 14, last menstrual period 03/06/2015, SpO2 100 %.  HEENT-  Normocephalic, no lesions, without obvious abnormality.  Normal external eye and conjunctiva.  Normal TM's bilaterally.  Normal auditory canals and external ears. Normal external nose, mucus membranes and septum.  Normal pharynx. Neck supple with no masses, nodes, nodules or enlargement. Cardiovascular - regular rate and rhythm, S1, S2 normal, no murmur, click, rub or gallop Lungs - chest clear, no wheezing, rales, normal symmetric air entry Abdomen - soft, non-tender; bowel sounds normal; no masses,  no organomegaly Extremities - no joint deformities, effusion, or inflammation and no skin discoloration  Neurologic Examination: Patient was intubated and on mechanical ventilation. Sedation with a fall was discontinued 20 minutes prior to this evaluation. She had received 50 mg of Ativan to female one hour prior to this evaluation. She was responsive to tactile and verbal stimulation with eye opening and eye deviation in the direction of stimulation. She did not follow simple commands. Pupils were equal and reacted normally to light. Extraocular movements were intact and conjugate with oculocephalic maneuvers. No facial asymmetry was noted. Muscle tone was flaccid throughout. Patient had no spontaneous movements and no abnormal posturing. She was to lower extremities equally to noxious stimuli. Deep tendon reflexes were 2+ and symmetrical. Plantar responses were extensor bilaterally.  Results for orders placed or performed during the hospital encounter of 03/25/15 (from the past 48 hour(s))  CBC     Status: Abnormal   Collection Time: 03/25/15  7:17 PM  Result Value Ref Range   WBC 16.9 (H) 4.0 - 10.5 K/uL   RBC 5.24 (H) 3.87 - 5.11 MIL/uL   Hemoglobin 16.2 (H) 12.0 - 15.0 g/dL   HCT 48.7 (H) 36.0 - 46.0 %   MCV 92.9 78.0 - 100.0 fL   MCH 30.9 26.0  - 34.0 pg   MCHC 33.3 30.0 - 36.0 g/dL   RDW 13.9 11.5 - 15.5 %   Platelets 310 150 - 400 K/uL   Dg Abd 1 View  03/25/2015   CLINICAL DATA:  OG tube placement, cardiac arrest  EXAM: ABDOMEN - 1 VIEW  COMPARISON:  CT abdomen pelvis dated 12/30/2014  FINDINGS: Enteric tube is looped in the stomach and terminates in the gastric cardia.  Nonobstructive bowel gas pattern.  Defibrillator pads overlie the lower chest/upper abdomen.  IMPRESSION: Enteric tube is looped in the stomach and terminates in the gastric cardia.   Electronically Signed   By: Julian Hy M.D.   On: 03/25/2015 16:10   Ct Head Wo Contrast  03/25/2015   CLINICAL DATA:  Found on floor at work.  Unresponsive.  EXAM: CT HEAD WITHOUT CONTRAST  CT CERVICAL SPINE WITHOUT CONTRAST  TECHNIQUE: Multidetector CT imaging of the head and cervical spine was performed following the standard  protocol without intravenous contrast. Multiplanar CT image reconstructions of the cervical spine were also generated.  COMPARISON:  10/27/2012  FINDINGS: CT HEAD FINDINGS  No acute intracranial abnormality. Specifically, no hemorrhage, hydrocephalus, mass lesion, acute infarction, or significant intracranial injury. No acute calvarial abnormality. Visualized paranasal sinuses and mastoids clear. Orbital soft tissues unremarkable.  CT CERVICAL SPINE FINDINGS  Motion artifact degrades image quality. No fracture visualized. No malalignment. Prevertebral soft tissues are normal. No epidural or paraspinal hematoma.  IMPRESSION: No intracranial abnormality.  No acute bony abnormality in the cervical spine.   Electronically Signed   By: Rolm Baptise M.D.   On: 03/25/2015 15:06   Ct Cervical Spine Wo Contrast  03/25/2015   CLINICAL DATA:  Found on floor at work.  Unresponsive.  EXAM: CT HEAD WITHOUT CONTRAST  CT CERVICAL SPINE WITHOUT CONTRAST  TECHNIQUE: Multidetector CT imaging of the head and cervical spine was performed following the standard protocol without  intravenous contrast. Multiplanar CT image reconstructions of the cervical spine were also generated.  COMPARISON:  10/27/2012  FINDINGS: CT HEAD FINDINGS  No acute intracranial abnormality. Specifically, no hemorrhage, hydrocephalus, mass lesion, acute infarction, or significant intracranial injury. No acute calvarial abnormality. Visualized paranasal sinuses and mastoids clear. Orbital soft tissues unremarkable.  CT CERVICAL SPINE FINDINGS  Motion artifact degrades image quality. No fracture visualized. No malalignment. Prevertebral soft tissues are normal. No epidural or paraspinal hematoma.  IMPRESSION: No intracranial abnormality.  No acute bony abnormality in the cervical spine.   Electronically Signed   By: Rolm Baptise M.D.   On: 03/25/2015 15:06   Dg Chest Port 1 View  03/25/2015   CLINICAL DATA:  Intubation, cardiac arrest.  EXAM: PORTABLE CHEST - 1 VIEW  COMPARISON:  01/23/2015  FINDINGS: Endotracheal tube has tip in the origin of the left mainstem bronchus. This could be pulled back approximately 3 cm. Nasogastric tube is coiled once over the stomach with tip and side-port over the stomach in the left upper quadrant.  Lungs are hypoinflated without focal consolidation or effusion. Cardiomediastinal silhouette is within normal. Remainder of the exam is unchanged.  IMPRESSION: Hypoinflation without acute cardiopulmonary disease.  Tubes and lines as described. Note that the endotracheal tube has tip over the origin of the left mainstem bronchus.  These results were called by telephone at the time of interpretation on 03/25/2015 at 4:03 pm to Dr. Thomasene Lot, who verbally acknowledged these results.   Electronically Signed   By: Marin Olp M.D.   On: 03/25/2015 16:03    Assessment/Plan 49 year old lady with hypertension and hyperlipidemia presenting with cardiac arrest as well as possible associated seizure activity. Patient is showing no signs of diffuse nor focal seizure activity at this point,  including with discontinuation of propofol. Status epilepticus is unlikely, particularly with patient's responsiveness to external stimuli. It is unclear at this point where there patient has encephalopathic changes as a result of cardiac arrest and possible hypoxia.  Recommendations: 1. No contraindication to planned treatment with hypothermia and use of paralytic agents. 2. Continue Keppra IV 500 mg every 12 hours, for now. 3. EEG, routine adult study when patient is been rewarmed to normal temperature range. 4. MRI of the brain without contrast when feasible.  We will continue to follow this patient with you.  C.R. Nicole Kindred, MD [facility]  03/25/2015, 9:03 PM

## 2015-03-25 NOTE — ED Notes (Addendum)
Patient at work at [employer] and had witnessed fall to floor. Claire Mcdonald  911 called by staff member for breathing difficulty and seizures..  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  Per EMS patient given mg of versed for seizure activity.  Per EMS patient was down for approximately 3 minutes

## 2015-03-25 NOTE — ED Notes (Signed)
Dr. Reita Cliche notified of elevated troponin.

## 2015-03-25 NOTE — ED Notes (Signed)
[facility] here for transport. 

## 2015-03-25 NOTE — ED Notes (Signed)
Telephone report called Teal,RN at [facility].

## 2015-03-25 NOTE — ED Notes (Signed)
ETT pulled  back to 19cm at lip per MD request by respiratory therapist.

## 2015-03-25 NOTE — Procedures (Signed)
Central Venous Catheter Insertion Procedure Note Claire Mcdonald [id number] [birth year]  Procedure: Insertion of Central Venous Catheter Indications: Drug and/or fluid administration and Frequent blood sampling  Procedure Details Consent: Unable to obtain consent because of altered level of consciousness. Time Out: Verified patient identification, verified procedure, site/side was marked, verified correct patient position, special equipment/implants available, medications/allergies/relevent history reviewed, required imaging and test results available.  Performed  Maximum sterile technique was used including antiseptics, cap, gloves, gown, hand hygiene, mask and sheet. Skin prep: Chlorhexidine; local anesthetic administered A antimicrobial bonded/coated triple lumen catheter was placed in the left subclavian vein using the Seldinger technique.  Evaluation Blood flow good Complications: No apparent complications Patient did tolerate procedure well. Chest X-ray ordered to verify placement.  CXR: normal.  [name], [name] 03/25/2015, 9:34 PM

## 2015-03-25 NOTE — ED Provider Notes (Addendum)
[facility] Emergency Department Provider Note   ____________________________________________  Time seen: On arrival by EMS I have reviewed the triage vital signs and the triage nursing note.  HISTORY  Chief Complaint Cardiac Arrest   Historian Per EMS because the patient unresponsive. Limited history due to patient's unresponsive  HPI Claire Mcdonald is a 49 y.o. 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 Apparently her breathing was inadequate and when fire department arrived there defibrillator advised shock and she was shocked twice. Upon EMS arrival she did have a pulse and adequate blood pressure however inadequate respiratory and was intubated with a King airway. Per report there is no known history of seizure disorder.    Past Medical History  Diagnosis Date  . Hypertension   . High cholesterol   . Anxiety   . Depression   . Allergy     Patient Active Problem List   Diagnosis Date Noted  . DDD (degenerative disc disease), lumbar 01/28/2015  . Lumbar facet arthropathy 01/28/2015  . Sacroiliac joint disease 01/28/2015    Past Surgical History  Procedure Laterality Date  . Tubal ligation  1998  . Ovarian cyst removal Left     Current Outpatient Rx  Name  Route  Sig  Dispense  Refill  . acetaminophen (TYLENOL) 500 MG tablet   Oral   Take 500 mg by mouth every 6 (six) hours as needed for pain.         Marland Kitchen albuterol (PROVENTIL HFA;VENTOLIN HFA) 108 (90 BASE) MCG/ACT inhaler   Inhalation   Inhale 2 puffs into the lungs every 6 (six) hours as needed for wheezing or shortness of breath. Patient not taking: Reported on 03/12/2015   1 Inhaler   0   . amoxicillin (AMOXIL) 875 MG tablet   Oral   Take 1 tablet (875 mg total) by mouth 2 (two) times daily. Patient not taking: Reported on 03/12/2015   20 tablet   0   . DULoxetine (CYMBALTA) 60 MG  capsule   Oral   Take 60 mg by mouth daily.         . fluticasone (FLONASE) 50 MCG/ACT nasal spray   Each Nare   Place 1 spray into both nostrils daily.   16 g   0   . furosemide (LASIX) 40 MG tablet   Oral   Take 40 mg by mouth daily.         Marland Kitchen gabapentin (NEURONTIN) 300 MG capsule   Oral   Take 300 mg by mouth as needed.          Marland Kitchen HYDROcodone-acetaminophen (NORCO/VICODIN) 5-325 MG per tablet   Oral   Take 2 tablets by mouth every 4 (four) hours as needed for pain. Patient not taking: Reported on 03/12/2015   10 tablet   0   . metoCLOPramide (REGLAN) 5 MG tablet   Oral   Take 1 tablet (5 mg total) by mouth every 6 (six) hours as needed for nausea. Patient not taking: Reported on 03/12/2015   10 tablet   0   . metoprolol succinate (TOPROL-XL) 100 MG 24 hr tablet   Oral   Take 100 mg by mouth daily. Take with or immediately following a meal.         . naproxen (NAPROSYN) 500 MG tablet   Oral   Take 1 tablet (500 mg total) by mouth 2 (two)  times daily with a meal. Patient not taking: Reported on 03/12/2015   20 tablet   0   . promethazine (PHENERGAN) 25 MG tablet   Oral   Take 1 tablet (25 mg total) by mouth every 6 (six) hours as needed for nausea. Patient not taking: Reported on 03/12/2015   20 tablet   0   . topiramate (TOPAMAX) 100 MG tablet   Oral   Take 100 mg by mouth 2 (two) times daily.           Allergies Toradol; Tramadol; and Zofran  Family History  Problem Relation Age of Onset  . Diabetes Father   . Asthma Father   . Hyperlipidemia Father   . Hypertension Father   . Kidney disease Father   . Heart disease Neg Hx   . Stroke Neg Hx   . Asthma Mother   . Depression Mother   . Hyperlipidemia Mother   . Hypertension Mother   . Cancer Maternal Grandmother   . Early death Maternal Grandmother   . Early death Paternal Grandmother     Social History History  Substance Use Topics  . Smoking status: Never Smoker   . Smokeless  tobacco: Never Used  . Alcohol Use: No    Review of Systems Unable to obtain due to patient unresponsive Constitutional:  Eyes:  ENT:  Cardiovascular: Respiratory:Marland Kitchen Gastrointestinal:. Genitourinary:  Musculoskeletal: Skin: Neurological:   ____________________________________________   PHYSICAL EXAM:  VITAL SIGNS: ED Triage Vitals  Enc Vitals Group     BP 03/25/15 1425 115/79 mmHg     Pulse Rate 03/25/15 1425 68     Resp --      Temp 03/25/15 1425 97.4 F (36.3 C)     Temp Source 03/25/15 1425 Oral     SpO2 03/25/15 1425 100 %     Weight 03/25/15 1425 195 lb (88.451 kg)     Height 03/25/15 1425 5\' 3"  (1.6 m)     Head Cir --      Peak Flow --      Pain Score --      Pain Loc --      Pain Edu? --      Excl. in [place]? --      Constitutional: Unresponsive with King airway intact. Eyes: Conjunctivae are normal. PERRL. 4 mm to 2 mm.  ENT   Head: Normocephalic and atraumatic.   Nose: No congestion/rhinnorhea.   Mouth/Throat: Mucous membranes are moist. Poor dentition.   Neck: No stridor. No C-spine step-offs. Cardiovascular/Chest: Normal rate, regular rhythm.  No murmurs, rubs, or gallops. Respiratory: Normal respiratory effort without tachypnea nor retractions. Moderate rhonchi bilateral bases. Gastrointestinal: Soft. No distention. Genitourinary/rectal:Deferred Musculoskeletal: No evidence of trauma or deformity. Neurologic:  Posturing versus seizure of upper extremities is extension.  Skin:  Skin is warm, dry and intact. No rash noted.   ____________________________________________   EKG I, Kuulei Roca, MD, the attending physician have personally viewed and interpreted all ECGs.  70 bpm. Normal sinus rhythm. Narrow QRS. Normal axis. Nonspecific T wave. ____________________________________________  LABS (pertinent positives/negatives)  CBC within normal limits Troponin 3.34 Metabolic panel without significant  abnormality  ____________________________________________  RADIOLOGY All Xrays were viewed by me. Imaging interpreted by Radiologist.    CT head noncontrast no acute abnormalities CT cervical spine without contrast: No traumatic injury  Chest x-ray portable: ET tube adequate position. No acute cardio pulmonary abnormality. __________________________________________  PROCEDURES  Procedure(s) performed: INTUBATION Performed by: Kayleeann Roca  Required items: required  blood products, implants, devices, and special equipment available Patient identity confirmed: provided demographic data and hospital-assigned identification number Time out: Immediately prior to procedure a "time out" was called to verify the correct patient, procedure, equipment, support staff and site/side marked as required.  Indications: Cardiac arrest, status epilepticus   Intubation method: Glidescope Laryngoscopy   Preoxygenation: BVM  Sedatives: Etomidate Paralytic: Succinylcholine  Tube Size: 7.5 cuffed  Post-procedure assessment: chest rise and ETCO2 monitor Breath sounds: equal and absent over the epigastrium Tube secured with ET tube older       Critical Care performed: CRITICAL CARE Performed by: Jakera Roca   Total critical care time: 75 minutes  Critical care time was exclusive of separately billable procedures and treating other patients.  Critical care was necessary to treat or prevent imminent or life-threatening deterioration.  Critical care was time spent personally by me on the following activities: development of treatment plan with patient and/or surrogate as well as nursing, discussions with consultants, evaluation of patient's response to treatment, examination of patient, obtaining history from patient or surrogate, ordering and performing treatments and interventions, ordering and review of laboratory studies, ordering and review of radiographic studies, pulse oximetry and  re-evaluation of patient's condition.   ____________________________________________   ED COURSE / ASSESSMENT AND PLAN  CONSULTATIONS: ICU attending Zacarias Pontes for transfer  Pertinent labs & imaging results that were available during my care of the patient were reviewed by me and considered in my medical decision making (see chart for details).  Patient arrived with a King airway in place with some spontaneous respirations and a good blood pressure with active seizure activity noted of bilateral upper extremities stiffening versus posturing. Concern for intracranial bleed versus mass emergency and patient was taken to CT as soon as possible for this study prior to changing airway. Patient was given additional medication for seizure activity upon arrival to the ED of 2 mg Ativan 2. She had received 4 mg of Versed prehospital.  EKG showed no STEMI.  CT head and C-spine were negative for intracranial emergency or trauma to the neck. Patient again was noted to have extensor stiffening of the bilateral upper extremities and was given 4 mg of Versed IV.  Proper preparations were made for change over of the [facility] airway to endotracheal intubation. No complications.  Patient was placed on Versed drip. Patient was also given bolus dosing of Keppra for continued seizure activity.  Consulted [facility] for transfer for status epilepticus.  Dr. Tamala Julian of the ICU recommended propofol rather than Versed drip and this was changed for sedation here. He recommended going ahead with ice packs for likely code ice cooling of the patient is given the cardiac arrest. It is quite unclear whether or not her illness started with seizure versus cardiac arrhythmia.  I updated the father as to the history and clinical course and transfer. ___________________________________________   FINAL CLINICAL IMPRESSION(S) / ED DIAGNOSES   Final diagnoses:  Status epilepticus, generalized convulsive  Cardiac arrest       Maribelle Roca, MD 03/25/15 1552  Danijela Roca, MD 03/25/15 [id number]

## 2015-03-25 NOTE — ED Notes (Signed)
Dr. Reita Cliche at bedside.  CT of cervical spine negative. C-collar removed.  Rapid intubation meds given, king airway removed by respiratory therapist and Dr. Reita Cliche.  Patient retintubated with 7.5ETT, 23cm at lip.  Patient with positive color change on c02 detector, condensation noted in ETT, equal bilateral breath sounds. ETT secured with commercial ETT holder.

## 2015-03-25 NOTE — ED Notes (Signed)
Ice packs applied to bilateral arm pits and to groin as per order.

## 2015-03-25 NOTE — ED Notes (Addendum)
Patient transported to CT, with RN and respiratory.

## 2015-03-25 NOTE — ED Notes (Signed)
c-collar placed on patient upon arrival.

## 2015-03-25 NOTE — ED Notes (Signed)
Telephone report called to [facility] with Percy.

## 2015-03-25 NOTE — Progress Notes (Signed)
CRITICAL VALUE ALERT  Critical value received:  Lactic Acid 3.1  Date of notification:  03/25/15  Time of notification:  2207  Critical value read back:Yes.    Nurse who received alert:  Verlin Grills  MD notified (1st page):  Elink  Time of first page:  2209  Responding MD:  Warren Lacy  Time MD responded:  2209  Also reported K of 5.1, WBC of 16.9. Will continue to monitor.

## 2015-03-25 NOTE — ED Notes (Signed)
Patient in care of [facility] and has left hospital.

## 2015-03-26 ENCOUNTER — Encounter (HOSPITAL_COMMUNITY): Payer: Self-pay | Admitting: Physician Assistant

## 2015-03-26 DIAGNOSIS — I10 Essential (primary) hypertension: Secondary | ICD-10-CM

## 2015-03-26 DIAGNOSIS — G40301 Generalized idiopathic epilepsy and epileptic syndromes, not intractable, with status epilepticus: Secondary | ICD-10-CM

## 2015-03-26 DIAGNOSIS — Z8249 Family history of ischemic heart disease and other diseases of the circulatory system: Secondary | ICD-10-CM

## 2015-03-26 LAB — CBC
HCT: 42.1 % (ref 36.0–46.0)
Hemoglobin: 14 g/dL (ref 12.0–15.0)
MCH: 31 pg (ref 26.0–34.0)
MCHC: 33.3 g/dL (ref 30.0–36.0)
MCV: 93.1 fL (ref 78.0–100.0)
PLATELETS: 260 10*3/uL (ref 150–400)
RBC: 4.52 MIL/uL (ref 3.87–5.11)
RDW: 14.1 % (ref 11.5–15.5)
WBC: 15.9 10*3/uL — ABNORMAL HIGH (ref 4.0–10.5)

## 2015-03-26 LAB — MAGNESIUM
Magnesium: 2.2 mg/dL (ref 1.7–2.4)
Magnesium: 2.2 mg/dL (ref 1.7–2.4)

## 2015-03-26 LAB — TROPONIN I
TROPONIN I: 5.13 ng/mL — AB (ref <0.031)
TROPONIN I: 4.67 ng/mL — AB (ref <0.031)
Troponin I: 6.34 ng/mL (ref <0.031)
Troponin I: 6.14 ng/mL (ref <0.031)

## 2015-03-26 LAB — GLUCOSE, CAPILLARY
GLUCOSE-CAPILLARY: 94 mg/dL (ref 65–99)
GLUCOSE-CAPILLARY: 109 mg/dL — AB (ref 65–99)
GLUCOSE-CAPILLARY: 80 mg/dL (ref 65–99)
Glucose-Capillary: 104 mg/dL — ABNORMAL HIGH (ref 65–99)
Glucose-Capillary: 66 mg/dL (ref 65–99)
Glucose-Capillary: 67 mg/dL (ref 65–99)
Glucose-Capillary: 71 mg/dL (ref 65–99)

## 2015-03-26 LAB — BASIC METABOLIC PANEL
Anion gap: 4 — ABNORMAL LOW (ref 5–15)
Anion gap: 7 (ref 5–15)
BUN: 11 mg/dL (ref 6–20)
BUN: 10 mg/dL (ref 6–20)
CALCIUM: 8.1 mg/dL — AB (ref 8.9–10.3)
CO2: 22 mmol/L (ref 22–32)
CO2: 23 mmol/L (ref 22–32)
CREATININE: 0.61 mg/dL (ref 0.44–1.00)
CREATININE: 0.62 mg/dL (ref 0.44–1.00)
Calcium: 8.2 mg/dL — ABNORMAL LOW (ref 8.9–10.3)
Chloride: 113 mmol/L — ABNORMAL HIGH (ref 101–111)
Chloride: 110 mmol/L (ref 101–111)
GFR calc non Af Amer: >60 mL/min (ref >60)
GFR calc non Af Amer: >60 mL/min (ref >60)
GLUCOSE: 102 mg/dL — AB (ref 65–99)
Glucose, Bld: 99 mg/dL (ref 65–99)
POTASSIUM: 3.8 mmol/L (ref 3.5–5.1)
POTASSIUM: 3.5 mmol/L (ref 3.5–5.1)
SODIUM: 139 mmol/L (ref 135–145)
Sodium: 140 mmol/L (ref 135–145)

## 2015-03-26 LAB — POCT I-STAT 3, ART BLOOD GAS (G3+)
Acid-base deficit: 6 mmol/L — ABNORMAL HIGH (ref 0.0–2.0)
Bicarbonate: 19.1 mEq/L — ABNORMAL LOW (ref 20.0–24.0)
O2 Saturation: 99 %
Patient temperature: 98
TCO2: 20 mmol/L (ref 0–100)
pCO2 arterial: 34.8 mmHg — ABNORMAL LOW (ref 35.0–45.0)
pH, Arterial: 7.346 — ABNORMAL LOW (ref 7.350–7.450)
pO2, Arterial: 122 mmHg — ABNORMAL HIGH (ref 80.0–100.0)

## 2015-03-26 LAB — HEPARIN LEVEL (UNFRACTIONATED)
Heparin Unfractionated: 0.44 IU/mL (ref 0.30–0.70)
Heparin Unfractionated: 0.69 IU/mL (ref 0.30–0.70)

## 2015-03-26 LAB — APTT: aPTT: 86 seconds — ABNORMAL HIGH (ref 24–37)

## 2015-03-26 LAB — PROTIME-INR
INR: 1.13 (ref 0.00–1.49)
Prothrombin Time: 14.7 seconds (ref 11.6–15.2)

## 2015-03-26 LAB — LACTIC ACID, PLASMA: LACTIC ACID, VENOUS: 0.8 mmol/L (ref 0.5–2.0)

## 2015-03-26 LAB — PHOSPHORUS: Phosphorus: 3.7 mg/dL (ref 2.5–4.6)

## 2015-03-26 MED ORDER — METOPROLOL TARTRATE 1 MG/ML IV SOLN
5.0000 mg | Freq: Four times a day (QID) | INTRAVENOUS | Status: DC
  Administered 2015-03-26 – 2015-03-30 (×14): 5 mg via INTRAVENOUS
  Filled 2015-03-26 (×20): qty 5

## 2015-03-26 MED ORDER — DEXTROSE 50 % IV SOLN
INTRAVENOUS | Status: AC
  Filled 2015-03-26: qty 50

## 2015-03-26 MED ORDER — DEXTROSE 50 % IV SOLN: 1.0000 | Freq: Once | INTRAVENOUS | Status: DC

## 2015-03-26 MED ORDER — POTASSIUM CHLORIDE 10 MEQ/50ML IV SOLN
10.0000 meq | INTRAVENOUS | Status: AC
  Administered 2015-03-26 (×4): 10 meq via INTRAVENOUS
  Filled 2015-03-26 (×4): qty 50

## 2015-03-26 MED ORDER — CHLORHEXIDINE GLUCONATE 0.12 % MT SOLN
15.0000 mL | Freq: Two times a day (BID) | OROMUCOSAL | Status: DC
  Administered 2015-03-26 – 2015-03-29 (×9): 15 mL via OROMUCOSAL
  Filled 2015-03-26 (×3): qty 15

## 2015-03-26 MED ORDER — DEXTROSE IN LACTATED RINGERS 5 % IV SOLN
INTRAVENOUS | Status: DC
  Administered 2015-03-26 – 2015-03-29 (×4): via INTRAVENOUS

## 2015-03-26 MED ORDER — CETYLPYRIDINIUM CHLORIDE 0.05 % MT LIQD
7.0000 mL | Freq: Four times a day (QID) | OROMUCOSAL | Status: DC
  Administered 2015-03-26 – 2015-03-30 (×17): 7 mL via OROMUCOSAL

## 2015-03-26 MED ORDER — HEPARIN (PORCINE) IN NACL 100-0.45 UNIT/ML-% IJ SOLN
900.0000 [IU]/h | INTRAMUSCULAR | Status: DC
  Administered 2015-03-26: 1000 [IU]/h via INTRAVENOUS
  Administered 2015-03-27: 900 [IU]/h via INTRAVENOUS
  Filled 2015-03-26 (×4): qty 250

## 2015-03-26 MED ORDER — DEXTROSE 50 % IV SOLN
25.0000 mL | Freq: Once | INTRAVENOUS | Status: AC
  Administered 2015-03-26: 25 mL via INTRAVENOUS

## 2015-03-26 MED ORDER — METOPROLOL TARTRATE 1 MG/ML IV SOLN
INTRAVENOUS | Status: AC
  Filled 2015-03-26: qty 5

## 2015-03-26 NOTE — Consult Note (Signed)
CARDIOLOGY CONSULT NOTE   Patient ID: Claire Mcdonald MRN: [id number] DOB/AGE: 03/31/1966 49 y.o.  Admit date: 03/25/2015  Primary Physician   Golden Pop, MD Primary Cardiologist   New Reason for Consultation   Cardiac arrest  Claire Mcdonald is a 49 y.o. year old female with a history of HTN, HL, depression, chronic back pain (injection for possible facet syndrome on 07/18), obesity, and GERD, but no cardiac issues.   Per her sons, she would occasionally get a "fluttering" feeling in her chest, but has never passed out or thought she was going to do so. The episodes were brief, and resolve without any meds. Also, she would occasionally complain of chest pain. She has never been evaluated for either of these.  She was at work at [employer] 07/31 and lost consciousness, fell, and had possible seizure activity. AED advised shock, and she was shocked x 2. Upon EMS arrival, she had a pulse but inadequate respirations and a King airway was placed. She was then intubated in [facility], and is on a vent. For another episode of possible seizure activity at [facility] and en route, she was given Ativan and Keppra. There is no mention of any arrhythmia during these episodes.   She was transferred to [facility] last pm. At one point, the propofol was held due to possible seizure activity, and she was able to respond to commands. Currently, she is sedated. She was cooled, with the 36C cooling protocol.   Overnight, she had some runs of NSVT which resolved spontaneously.   Past Medical History  Diagnosis Date  . Hypertension   . High cholesterol   . Anxiety   . Depression   . Allergy   . GERD (gastroesophageal reflux disease)   . Migraine   . Diverticulosis     seen on CT 2015  . Obesity (BMI 30-39.9)      Past Surgical History  Procedure Laterality Date  . Tubal ligation  1998  . Ovarian cyst removal Left   . Central line  03/25/2015       . Hand surgery Left     Allergies  Allergen Reactions  .  Toradol [Ketorolac Tromethamine] Itching and Rash  . Tramadol Itching and Rash  . Zofran [Ondansetron Hcl] Itching and Rash   I have reviewed the patient's current medications . albuterol  2.5 mg Nebulization Q4H  . antiseptic oral rinse  7 mL Mouth Rinse QID  . chlorhexidine  15 mL Mouth Rinse BID  . famotidine (PEPCID) IV  20 mg Intravenous Q12H  . levETIRAcetam  500 mg Intravenous Q12H   . dextrose 5% lactated ringers    . heparin 1,000 Units/hr (03/26/15 0447)  . norepinephrine (LEVOPHED) Adult infusion    . propofol (DIPRIVAN) infusion 20 mcg/kg/min (03/26/15 1113)   sodium chloride, acetaminophen, bisacodyl, fentaNYL (SUBLIMAZE) injection, fentaNYL (SUBLIMAZE) injection, sennosides  Medication Sig  acetaminophen (TYLENOL) 500 MG tablet Take 500 mg by mouth every 6 (six) hours as needed for pain.  albuterol (PROVENTIL HFA;VENTOLIN HFA) 108 (90 BASE) MCG/ACT inhaler Inhale 2 puffs into the lungs every 6 (six) hours as needed for wheezing or shortness of breath. Patient not taking: Reported on 03/12/2015  amoxicillin (AMOXIL) 875 MG tablet Take 1 tablet (875 mg total) by mouth 2 (two) times daily. Patient not taking: Reported on 03/12/2015  DULoxetine (CYMBALTA) 60 MG capsule Take 60 mg by mouth daily.  fluticasone (FLONASE) 50 MCG/ACT nasal spray Place 1 spray into both nostrils  daily.  furosemide (LASIX) 40 MG tablet Take 40 mg by mouth daily.  gabapentin (NEURONTIN) 300 MG capsule Take 300 mg by mouth as needed.   HYDROcodone-acetaminophen (NORCO/VICODIN) 5-325 MG per tablet Take 2 tablets by mouth every 4 (four) hours as needed for pain. Patient not taking: Reported on 03/12/2015  metoCLOPramide (REGLAN) 5 MG tablet Take 1 tablet (5 mg total) by mouth every 6 (six) hours as needed for nausea. Patient not taking: Reported on 03/12/2015  metoprolol succinate (TOPROL-XL) 100 MG 24 hr tablet Take 100 mg by mouth daily. Take with or immediately following a meal.  naproxen (NAPROSYN)  500 MG tablet Take 1 tablet (500 mg total) by mouth 2 (two) times daily with a meal. Patient not taking: Reported on 03/12/2015  promethazine (PHENERGAN) 25 MG tablet Take 1 tablet (25 mg total) by mouth every 6 (six) hours as needed for nausea. Patient not taking: Reported on 03/12/2015  topiramate (TOPAMAX) 100 MG tablet Take 100 mg by mouth 2 (two) times daily.   Marland Kitchen albuterol  2.5 mg Nebulization Q4H  . antiseptic oral rinse  7 mL Mouth Rinse QID  . chlorhexidine  15 mL Mouth Rinse BID  . famotidine (PEPCID) IV  20 mg Intravenous Q12H  . levETIRAcetam  500 mg Intravenous Q12H   . dextrose 5% lactated ringers 50 mL/hr at 03/26/15 1337  . heparin 1,000 Units/hr (03/26/15 0447)  . norepinephrine (LEVOPHED) Adult infusion    . propofol (DIPRIVAN) infusion 25 mcg/kg/min (03/26/15 1321)     History   Social History  . Marital Status: Single    Spouse Name: N/A  . Number of Children: N/A  . Years of Education: N/A   Occupational History  . Not on file.   Social History Main Topics  . Smoking status: Never Smoker   . Smokeless tobacco: Never Used  . Alcohol Use: No  . Drug Use: No  . Sexual Activity: Yes    Birth Control/ Protection: Surgical   Other Topics Concern  . Not on file   Social History Narrative   Lives in [place], [place].    Family Status  Relation Status Death Age  . Father Alive   . Mother Alive    Family History  Problem Relation Age of Onset  . Diabetes Father   . Asthma Father   . Hyperlipidemia Father   . Hypertension Father   . Kidney disease Father   . Heart disease Neg Hx   . Stroke Neg Hx   . Asthma Mother   . Depression Mother   . Hyperlipidemia Mother   . Hypertension Mother   . Cancer Maternal Grandmother   . Early death Maternal Grandmother   . Early death Paternal Grandmother      ROS:  Full 14 point review of systems complete and found to be negative unless listed above.  Physical Exam: Blood pressure 105/70, pulse 71, temperature  97.7 F (36.5 C), temperature source Core (Comment), resp. rate 12, height 5\' 3"  (1.6 m), weight 217 lb 6 oz (98.6 kg), last menstrual period 03/06/2015, SpO2 100 %.  General: Well developed, well nourished, female sedated on the vent. Head: Eyes Pupils midrange, not reactive, No xanthomas.   Normocephalic and atraumatic, oropharynx without edema or exudate. Dentition: ok Lungs: clear anteriorly Heart: HRRR S1 S2, no rub/gallop, no Murmur. pulses are 2+ all 4 extrem.   Neck: No carotid bruits. No lymphadenopathy.  JVD not elevated, but difficult to assess 2nd body habitus and equipment. Abdomen:  Bowel sounds present, abdomen soft and non-tender without masses or hernias noted. Msk:  No spine or cva tenderness. No weakness, no joint deformities or effusions. Extremities: No clubbing or cyanosis. no edema.  Neuro: sedated on the vent, responds to stimulus Skin: No rashes or lesions noted.  Labs:   Lab Results  Component Value Date   WBC 15.9* 03/26/2015   HGB 14.0 03/26/2015   HCT 42.1 03/26/2015   MCV 93.1 03/26/2015   PLT 260 03/26/2015    Recent Labs  03/25/15 1917  INR 1.02     Recent Labs Lab 03/25/15 2140 03/26/15 0500  NA 139 139  K 3.1* 3.8  CL 110 113*  CO2 22 22  BUN 16 11  CREATININE 0.70 0.61  CALCIUM 7.8* 8.1*  PROT 5.3*  --   BILITOT 0.8  --   ALKPHOS 43  --   ALT 71*  --   AST 113*  --   GLUCOSE 108* 99  ALBUMIN 2.9*  --    MAGNESIUM  Date Value Ref Range Status  03/26/2015 2.2 1.7 - 2.4 mg/dL Final    Recent Labs  03/25/15 1435 03/25/15 2140 03/26/15 0245 03/26/15 0500  TROPONINI 0.57* 2.76* 5.13* 6.34*   B NATRIURETIC PEPTIDE  Date/Time Value Ref Range Status  03/25/2015 09:40 PM 146.1* 0.0 - 100.0 pg/mL Final   Lab Results  Component Value Date   TRIG 133 03/25/2015    Echo: ordered  ECG:  Sinus bradycardia, HR 50s, diffuse T wave abnormalities  Radiology:  Dg Abd 1 View 03/25/2015   CLINICAL DATA:  OG tube placement, cardiac  arrest  EXAM: ABDOMEN - 1 VIEW  COMPARISON:  CT abdomen pelvis dated 12/30/2014  FINDINGS: Enteric tube is looped in the stomach and terminates in the gastric cardia.  Nonobstructive bowel gas pattern.  Defibrillator pads overlie the lower chest/upper abdomen.  IMPRESSION: Enteric tube is looped in the stomach and terminates in the gastric cardia.   Electronically Signed   By: Julian Hy M.D.   On: 03/25/2015 16:10   Ct Head Wo Contrast 03/25/2015   CLINICAL DATA:  Found on floor at work.  Unresponsive.  EXAM: CT HEAD WITHOUT CONTRAST  CT CERVICAL SPINE WITHOUT CONTRAST  TECHNIQUE: Multidetector CT imaging of the head and cervical spine was performed following the standard protocol without intravenous contrast. Multiplanar CT image reconstructions of the cervical spine were also generated.  COMPARISON:  10/27/2012  FINDINGS: CT HEAD FINDINGS  No acute intracranial abnormality. Specifically, no hemorrhage, hydrocephalus, mass lesion, acute infarction, or significant intracranial injury. No acute calvarial abnormality. Visualized paranasal sinuses and mastoids clear. Orbital soft tissues unremarkable.  CT CERVICAL SPINE FINDINGS  Motion artifact degrades image quality. No fracture visualized. No malalignment. Prevertebral soft tissues are normal. No epidural or paraspinal hematoma.  IMPRESSION: No intracranial abnormality.  No acute bony abnormality in the cervical spine.   Electronically Signed   By: Rolm Baptise M.D.   On: 03/25/2015 15:06   Dg Chest Port 1 View 03/25/2015   CLINICAL DATA:  Central line placement.  Hypothermia.  EXAM: PORTABLE CHEST - 1 VIEW  COMPARISON:  03/25/2015.  FINDINGS: Support apparatus: Endotracheal tube tip 1 cm from the carina. Enteric tube is present doubled back upon itself with the tip at the cardia of the stomach.  New LEFT subclavian central line is present. The tip is in the lower superior vena cava at the cavoatrial junction.  Cardiomediastinal Silhouette:  Unchanged.   Lungs: Opacity at the  LEFT costophrenic angle, likely representing atelectasis. Airspace disease could also produce this appearance. No pneumothorax.  Effusions:  None.  Other:  None.  IMPRESSION: 1. Support apparatus as described above. The endotracheal tube has been retracted but remains 1 cm from the carina. 2. Uncomplicated interval placement of LEFT subclavian central line with the tip at the junction of the superior vena cava and RIGHT atrium. 3. Opacification at the LEFT costophrenic angle which may represent atelectasis or airspace disease.   Electronically Signed   By: Dereck Ligas M.D.   On: 03/25/2015 21:01   Dg Chest Port 1 View 03/25/2015   CLINICAL DATA:  Intubation, cardiac arrest.  EXAM: PORTABLE CHEST - 1 VIEW  COMPARISON:  01/23/2015  FINDINGS: Endotracheal tube has tip in the origin of the left mainstem bronchus. This could be pulled back approximately 3 cm. Nasogastric tube is coiled once over the stomach with tip and side-port over the stomach in the left upper quadrant.  Lungs are hypoinflated without focal consolidation or effusion. Cardiomediastinal silhouette is within normal. Remainder of the exam is unchanged.  IMPRESSION: Hypoinflation without acute cardiopulmonary disease.  Tubes and lines as described. Note that the endotracheal tube has tip over the origin of the left mainstem bronchus.  These results were called by telephone at the time of interpretation on 03/25/2015 at 4:03 pm to Dr. Thomasene Lot, who verbally acknowledged these results.   Electronically Signed   By: Marin Olp M.D.   On: 03/25/2015 16:03     ASSESSMENT AND PLAN:   The patient was seen today by Dr Meda Coffee, the patient evaluated and the data reviewed.  Principal Problem:   Cardiac arrest - felt 2nd VT/VF, has had some VT here, see CV strips under chart review. - K+ was low on admission, would continue supplement to keep K+ at 4, Mg is at 2  - f/u on echo results, but may want to get a limited study for now  as equipment makes imaging difficult.    Elevated troponin - still trending up, continue to cycle - CPR interval was very short, not sure that would have caused troponin this high. - echo ordered, will need ischemic eval once she is recovered from acute event.    Hypokalemia - PTA was on Lasix 40 mg qd, so may be chronic problem - Keep close to 4    Bradycardia - sinus brady - was on Toprol XL 100 mg qd PTA - follow  Otherwise, per CCM Active Problems:   Status epilepticus   Signed: Lenoard Aden 03/26/2015 12:49 PM Beeper [phone]  Co-Sign MD  The patient was seen, examined and discussed with Rosaria Ferries, PA-C and I agree with the above.   49 year old female with h/o HTN, no prior cardiac history who was admitted after sustaining a witnessed cardiac arrest and received 2 AICD shocks for presumed VT/VF (no documentation). She was intubated and started on hypothermia protocol yesterday. The patient is responding when weaned off pressors. Neurology is working up for possible seizure (no history).  Her initial ECG showed negative T waves in the inferior and anterolateral leads, not present on ECG in May 2016. Her troponin continues to rise 0.57-> 2.7-> 5.1-> 6.3.  Per her sons, she would occasionally get a "fluttering" feeling in her chest, without presyncope or syncope and occasional complains of chest pain ( unknown if exertional). She has never smoked, her father died of MI.   Her Crea is normal, I will discuss timing of  a cardiac cath with the interventional team.   Dorothy Spark 03/26/2015

## 2015-03-26 NOTE — Progress Notes (Addendum)
ANTICOAGULATION CONSULT NOTE - Follow Up Consult  Pharmacy Consult for Heparin Indication: Elevated troponins s/p cardiac arrest  Allergies  Allergen Reactions  . Toradol [Ketorolac Tromethamine] Itching and Rash  . Tramadol Itching and Rash  . Zofran [Ondansetron Hcl] Itching and Rash    Patient Measurements: Height: 5\' 3"  (160 cm) Weight: 217 lb 6 oz (98.6 kg) IBW/kg (Calculated) : 52.4 Heparin Dosing Weight: 75 kg  Vital Signs: Temp: 97.2 F (36.2 C) (08/01 1100) Temp Source: Core (Comment) (08/01 1100) BP: 117/71 mmHg (08/01 1102) Pulse Rate: 82 (08/01 1102)  Labs:  Recent Labs  03/25/15 1435 03/25/15 1917 03/25/15 2140 03/26/15 0245 03/26/15 0500 03/26/15 1042  HGB 14.4 16.2*  --   --  14.0  --   HCT 44.3 48.7*  --   --  42.1  --   PLT 283 310  --   --  260  --   APTT  --   --  25  --   --   --   LABPROT  --  13.6  --   --   --   --   INR  --  1.02  --   --   --   --   HEPARINUNFRC  --   --   --   --   --  0.44  CREATININE 0.99 0.84 0.70  --  0.61  --   TROPONINI 0.57*  --  2.76* 5.13* 6.34*  --     Estimated Creatinine Clearance: 97.3 mL/min (by C-G formula based on Cr of 0.61).  Assessment: 49yo female was noted by coworkers to be having a seizure, EMS called, AED advised shock for Vfib vs Vtach arrest, status epilepticus lasted ~76min, currently on 36-degree cooling after pt started to follow commands, now w/ rising troponin, head CT clear, to begin heparin.  Anticoagulation: Heparin drip s/p cardiac arrest with elevated troponins. HL 0.44 CBC WNL  Goal of Therapy:  Heparin level 0.3-0.7 units/ml Monitor platelets by anticoagulation protocol: Yes   Plan:  Heparin 1000 units/hr Confirm level in 6 hours. Daily HL and CBC  Crystal S. Alford Highland, PharmD, BCPS Clinical Staff Pharmacist Pager [phone]  Eilene Ghazi Stillinger 03/26/2015,11:33 AM  _____________________________________ Addendum:  HL 0.69 (goal 0.3 - 0.7)  Plan: Decrease  to 900 units/hr heparin HL at [place], PharmD, [facility] Clinical Pharmacist Pager [phone] 03/26/2015 5:50 PM

## 2015-03-26 NOTE — Progress Notes (Signed)
Attempted A-line on pt. By 2 RT's & all attempts were unsuccessful. CCM was made aware by RN.

## 2015-03-26 NOTE — Progress Notes (Signed)
Subjective: No further seizure activity Troponin increased to 5.13, currently on heparin drip  Objective: Current vital signs: BP 107/63 mmHg  Pulse 58  Temp(Src) 96.4 F (35.8 C) (Core (Comment))  Resp 11  Ht 5\' 3"  (1.6 m)  Wt 98.6 kg (217 lb 6 oz)  BMI 38.52 kg/m2  SpO2 100%  LMP 03/06/2015 Vital signs in last 24 hours: Temp:  [94.8 F (34.9 C)-98.1 F (36.7 C)] 96.4 F (35.8 C) (08/01 0600) Pulse Rate:  [51-96] 58 (08/01 0600) Resp:  [6-27] 11 (08/01 0600) BP: (91-155)/(42-110) 107/63 mmHg (08/01 0600) SpO2:  [95 %-100 %] 100 % (08/01 0600) FiO2 (%):  [30 %-40 %] 30 % (08/01 0434) Weight:  [88.451 kg (195 lb)-98.6 kg (217 lb 6 oz)] 98.6 kg (217 lb 6 oz) (08/01 0400)  Intake/Output from previous day: 07/31 0701 - 08/01 0700 In: 499.9 [I.V.:144.9; IV Piggyback:355] Out: 250 [Urine:250] Intake/Output this shift:   Nutritional status: Diet NPO time specified  Neurologic Exam:  Patient was intubated and on mechanical ventilation. Currently on sedation.  Non-verbal, no response to verbal or noxious stimuli. No spontaneous eye opening, not following commands Extraocular movements were intact and conjugate with oculocephalic maneuvers. No gaze deviation. PERRL No facial asymmetry was noted. Muscle tone was flaccid throughout. Patient had no spontaneous movements and no abnormal posturing. She was to lower extremities equally to noxious stimuli. Plantar responses were extensor bilaterally. Lab Results: Basic Metabolic Panel:  Recent Labs Lab 03/25/15 1435 03/25/15 1917 03/25/15 2140 03/26/15 0500  NA 140 141 139 139  K 3.7 5.1 3.1* 3.8  CL 110 108 110 113*  CO2 21* 21* 22 22  GLUCOSE 193* 85 108* 99  BUN 16 15 16 11   CREATININE 0.99 0.84 0.70 0.61  CALCIUM 8.5* 8.9 7.8* 8.1*  MG  --  2.5*  --  2.2  PHOS  --  3.8  --  3.7    Liver Function Tests:  Recent Labs Lab 03/25/15 1435 03/25/15 2140  AST 112* 113*  ALT 86* 71*  ALKPHOS 55 43  BILITOT 0.8 0.8   PROT 6.7 5.3*  ALBUMIN 3.8 2.9*   No results for input(s): LIPASE, AMYLASE in the last 168 hours. No results for input(s): AMMONIA in the last 168 hours.  CBC:  Recent Labs Lab 03/25/15 1435 03/25/15 1917 03/26/15 0500  WBC 8.8 16.9* 15.9*  NEUTROABS 5.7  --   --   HGB 14.4 16.2* 14.0  HCT 44.3 48.7* 42.1  MCV 92.5 92.9 93.1  PLT 283 310 260    Cardiac Enzymes:  Recent Labs Lab 03/25/15 1435 03/25/15 2140 03/26/15 0245 03/26/15 0500  TROPONINI 0.57* 2.76* 5.13* 6.34*    Lipid Panel:  Recent Labs Lab 03/25/15 2140  TRIG 133    CBG:  Recent Labs Lab 03/25/15 2138 03/25/15 2355 03/26/15 0027 03/26/15 0452  GLUCAP 105* 67 104* 49    Microbiology: Results for orders placed or performed during the hospital encounter of 03/25/15  MRSA PCR Screening     Status: None   Collection Time: 03/25/15  8:44 PM  Result Value Ref Range Status   MRSA by PCR NEGATIVE NEGATIVE Final    Comment:        The GeneXpert MRSA Assay (FDA approved for NASAL specimens only), is one component of a comprehensive MRSA colonization surveillance program. It is not intended to diagnose MRSA infection nor to guide or monitor treatment for MRSA infections.     Coagulation Studies:  Recent Labs  03/25/15 1917  LABPROT 13.6  INR 1.02    Imaging: Dg Abd 1 View  03/25/2015   CLINICAL DATA:  OG tube placement, cardiac arrest  EXAM: ABDOMEN - 1 VIEW  COMPARISON:  CT abdomen pelvis dated 12/30/2014  FINDINGS: Enteric tube is looped in the stomach and terminates in the gastric cardia.  Nonobstructive bowel gas pattern.  Defibrillator pads overlie the lower chest/upper abdomen.  IMPRESSION: Enteric tube is looped in the stomach and terminates in the gastric cardia.   Electronically Signed   By: Julian Hy M.D.   On: 03/25/2015 16:10   Ct Head Wo Contrast  03/25/2015   CLINICAL DATA:  Found on floor at work.  Unresponsive.  EXAM: CT HEAD WITHOUT CONTRAST  CT CERVICAL  SPINE WITHOUT CONTRAST  TECHNIQUE: Multidetector CT imaging of the head and cervical spine was performed following the standard protocol without intravenous contrast. Multiplanar CT image reconstructions of the cervical spine were also generated.  COMPARISON:  10/27/2012  FINDINGS: CT HEAD FINDINGS  No acute intracranial abnormality. Specifically, no hemorrhage, hydrocephalus, mass lesion, acute infarction, or significant intracranial injury. No acute calvarial abnormality. Visualized paranasal sinuses and mastoids clear. Orbital soft tissues unremarkable.  CT CERVICAL SPINE FINDINGS  Motion artifact degrades image quality. No fracture visualized. No malalignment. Prevertebral soft tissues are normal. No epidural or paraspinal hematoma.  IMPRESSION: No intracranial abnormality.  No acute bony abnormality in the cervical spine.   Electronically Signed   By: Rolm Baptise M.D.   On: 03/25/2015 15:06   Ct Cervical Spine Wo Contrast  03/25/2015   CLINICAL DATA:  Found on floor at work.  Unresponsive.  EXAM: CT HEAD WITHOUT CONTRAST  CT CERVICAL SPINE WITHOUT CONTRAST  TECHNIQUE: Multidetector CT imaging of the head and cervical spine was performed following the standard protocol without intravenous contrast. Multiplanar CT image reconstructions of the cervical spine were also generated.  COMPARISON:  10/27/2012  FINDINGS: CT HEAD FINDINGS  No acute intracranial abnormality. Specifically, no hemorrhage, hydrocephalus, mass lesion, acute infarction, or significant intracranial injury. No acute calvarial abnormality. Visualized paranasal sinuses and mastoids clear. Orbital soft tissues unremarkable.  CT CERVICAL SPINE FINDINGS  Motion artifact degrades image quality. No fracture visualized. No malalignment. Prevertebral soft tissues are normal. No epidural or paraspinal hematoma.  IMPRESSION: No intracranial abnormality.  No acute bony abnormality in the cervical spine.   Electronically Signed   By: Rolm Baptise M.D.    On: 03/25/2015 15:06   Dg Chest Port 1 View  03/25/2015   CLINICAL DATA:  Central line placement.  Hypothermia.  EXAM: PORTABLE CHEST - 1 VIEW  COMPARISON:  03/25/2015.  FINDINGS: Support apparatus: Endotracheal tube tip 1 cm from the carina. Enteric tube is present doubled back upon itself with the tip at the cardia of the stomach.  New LEFT subclavian central line is present. The tip is in the lower superior vena cava at the cavoatrial junction.  Cardiomediastinal Silhouette:  Unchanged.  Lungs: Opacity at the LEFT costophrenic angle, likely representing atelectasis. Airspace disease could also produce this appearance. No pneumothorax.  Effusions:  None.  Other:  None.  IMPRESSION: 1. Support apparatus as described above. The endotracheal tube has been retracted but remains 1 cm from the carina. 2. Uncomplicated interval placement of LEFT subclavian central line with the tip at the junction of the superior vena cava and RIGHT atrium. 3. Opacification at the LEFT costophrenic angle which may represent atelectasis or airspace disease.   Electronically Signed   By:  Dereck Ligas M.D.   On: 03/25/2015 21:01   Dg Chest Port 1 View  03/25/2015   CLINICAL DATA:  Intubation, cardiac arrest.  EXAM: PORTABLE CHEST - 1 VIEW  COMPARISON:  01/23/2015  FINDINGS: Endotracheal tube has tip in the origin of the left mainstem bronchus. This could be pulled back approximately 3 cm. Nasogastric tube is coiled once over the stomach with tip and side-port over the stomach in the left upper quadrant.  Lungs are hypoinflated without focal consolidation or effusion. Cardiomediastinal silhouette is within normal. Remainder of the exam is unchanged.  IMPRESSION: Hypoinflation without acute cardiopulmonary disease.  Tubes and lines as described. Note that the endotracheal tube has tip over the origin of the left mainstem bronchus.  These results were called by telephone at the time of interpretation on 03/25/2015 at 4:03 pm to Dr.  Thomasene Lot, who verbally acknowledged these results.   Electronically Signed   By: Marin Olp M.D.   On: 03/25/2015 16:03    Medications:  Scheduled: . albuterol  2.5 mg Nebulization Q4H  . antiseptic oral rinse  7 mL Mouth Rinse QID  . chlorhexidine  15 mL Mouth Rinse BID  . famotidine (PEPCID) IV  20 mg Intravenous Q12H  . levETIRAcetam  500 mg Intravenous Q12H    Assessment/Plan:  49 year old lady with hypertension and hyperlipidemia presenting with cardiac arrest as well as possible associated seizure activity. Patient is showing no signs of diffuse nor focal seizure activity at this point, including with discontinuation of propofol. Status epilepticus is unlikely, particularly with patient's responsiveness to external stimuli. It is unclear at this point whether the patient has encephalopathic changes as a result of cardiac arrest and possible hypoxia.  Recommendations: 1. No contraindication to planned treatment with hypothermia and use of paralytic agents. 2. Continue Keppra IV 500 mg every 12 hours, for now. 3. EEG, routine adult study when patient is been rewarmed to normal temperature range. 4. Consider MRI of the brain without contrast when feasible. 5. Cardiac workup and treatment per primary team   LOS: 1 day   Jim Like, DO [facility] [phone]  If 7pm- 7am, please page neurology on call as listed in [facility]. 03/26/2015  7:33 AM

## 2015-03-26 NOTE — Progress Notes (Signed)
Hypoglycemic Event  CBG: 67  Treatment: D50 IV 25 mL and D50 IV 50 mL  Symptoms: None  Follow-up CBG: Time:1245 CBG Result: 109  Possible Reasons for Event: Other: NPO  Comments/MD notified: Dr. Ashok Cordia notified and new orders initiated.   Maxie Barb  Remember to initiate Hypoglycemia Order Set & complete

## 2015-03-26 NOTE — Clinical Documentation Improvement (Signed)
MD's, NP's, and PA's  Noted  "apnea post arrest" if either of the following diagnoses is appropriate for this admission please document in notes and discharge summary.  Thank you   Possible Clinical Conditions?  Acute Respiratory Failure  Acute Respiratory Arrest  Other Condition  Cannot Clinically Determine    Risk Factors:Cardiac Arrest, Seizure    Diagnostics: pulse ox  Treatment: Intubated and ventilated  Thank You, Ree Kida ,RN Clinical Documentation Specialist:  [place] [facility]

## 2015-03-26 NOTE — Care Management Note (Signed)
Case Management Note  Patient Details  Name: Claire Mcdonald MRN: [id number] Date of Birth: 07-22-1966  Subjective/Objective:    Adm w cardiac arrest, vent                Action/Plan: lives w fam, pcp dr mark Luvenia Heller   Expected Discharge Date:                  Expected Discharge Plan:  [place]  In-House Referral:     Discharge planning Services     Post Acute Care Choice:    Choice offered to:     DME Arranged:    DME Agency:     HH Arranged:    [place] Agency:     Status of Service:     Medicare Important Message Given:    Date Medicare IM Given:    Medicare IM give by:    Date Additional Medicare IM Given:    Additional Medicare Important Message give by:     If discussed at [place] of Stay Meetings, dates discussed:    Additional Comments: ur review done  Lacretia Leigh, RN 03/26/2015, 8:48 AM

## 2015-03-26 NOTE — Progress Notes (Signed)
Called to inform Elink MD of troponin of 5.13. MD started heparin.

## 2015-03-26 NOTE — Progress Notes (Signed)
[place] Progress Note Patient Name: LYNNAE LUDEMANN DOB: 20-Jan-1966 MRN: [id number]   Date of Service  03/26/2015  HPI/Events of Note  Multiple issues: 1. K+ = 3.1 and Creatinine = 0.7 and 2. Troponin = 2.76. Elevated Troponin likely related to CPR. Bradycardia precludes B-Blocker and Toradol allergy (rash and itching) precludes ASA. Will trend troponin for now.   eICU Interventions  Will order: 1. Replete K+.      Intervention Category Major Interventions: Other: Intermediate Interventions: Electrolyte abnormality - evaluation and management  Lucia Mccreadie Eugene 03/26/2015, 12:53 AM

## 2015-03-26 NOTE — Progress Notes (Signed)
CRITICAL VALUE ALERT  Critical value received:  Troponin 2.76  Date of notification:  03/25/15  Time of notification:  2304  Critical value read back:Yes.    Nurse who received alert:  Verlin Grills  MD notified (1st page):  Elink  Time of first page:  0050  Responding MD:  Warren Lacy  Time MD responded:  1607  Also told MD that K is 3.1. Will continue to monitor.

## 2015-03-26 NOTE — Progress Notes (Signed)
Hypoglycemic Event  CBG: 67  Treatment: D50 IV 25 mL  Symptoms: None  Follow-up CBG: Time: 0027 CBG Result:104  Possible Reasons for Event: Unknown     Claire Mcdonald

## 2015-03-26 NOTE — Progress Notes (Addendum)
[place] Progress Note Patient Name: Claire Mcdonald DOB: 13-Aug-1966 MRN: [id number]   Date of Service  03/26/2015  HPI/Events of Note  Troponin on admission = 0.57 >> 2.76. Head CT Scan negative. May be d/t CPR, however, will start on Heparin IV infusion. Can't start B-Blocker or ASA per previous note. Continue to trend troponin.  eICU Interventions  Will order:  1. Heparin IV infusion per pharmacy.     Intervention Category Intermediate Interventions: Diagnostic test evaluation  [place] Semrad Cornelia Copa 03/26/2015, 3:34 AM

## 2015-03-26 NOTE — Progress Notes (Signed)
Initial Nutrition Assessment  DOCUMENTATION CODES:   Morbid obesity  INTERVENTION:   -Once pt is re-warmed, recommend:  Initiate Vital High Protein @ 5 ml/hr via OGT.  60 ml Prostat QID.    Tube feeding regimen provides 920 kcal (80% of needs), 131 grams of protein, and 97 ml of H2O.   Total regimen with current propofol rate provides 1305 kcals (100% of needs).   NUTRITION DIAGNOSIS:   Inadequate oral intake related to inability to eat as evidenced by NPO status.  GOAL:   Provide needs based on ASPEN/SCCM guidelines   MONITOR:   Vent status, Labs, Weight trends, Skin, I & O's  REASON FOR ASSESSMENT:   Consult Assessment of nutrition requirement/status  ASSESSMENT:   Claire Mcdonald is 49 y/o woman with past medical history of hypertension, chronic pain and depression who is on gabapentin and prn topomax. She was working at [employer] this evening when her coworkers called EMS to report seizure like activity. When EMS arrived she was placed on an AED and shock was advised. She was shocked twice and a King airway placed. She was noted to again have seizure like activity in the ambulance and in the ED at [facility]. She was treated with ativan and keppra. She was transferred to [facility] for cooling after out of hospital arrest.  Pt transferred to [facility] from [facility] for cardiac arrest and status epilepticus.   Patient is currently intubated on ventilator support. OGT in place.   MV: 8.5 L/min Temp (24hrs), Avg:96.8 F (36 C), Min:94.8 F (34.9 C), Max:98.1 F (36.7 C)  Propofol: 14.6 ml/hr (385 kcals)  Nutrition-Focused physical exam completed. Findings are no fat depletion, no muscle depletion, and mild edema.   Pt on arctic sun protocol.  Diet Order:  Diet NPO time specified  Skin:  Reviewed, no issues  Last BM:  PTA  Height:   Ht Readings from Last 1 Encounters:  03/26/15 5\' 3"  (1.6 m)    Weight:   Wt Readings from Last 1 Encounters:  03/26/15 217 lb 6  oz (98.6 kg)    Ideal Body Weight:  52.3 kg  BMI:  Body mass index is 38.52 kg/(m^2).  Estimated Nutritional Needs:   Kcal:  1150-1307  Protein:  >130 grams  Fluid:  >1.2 L  EDUCATION NEEDS:   No education needs identified at this time  Cassadee Vanzandt A. Jimmye Norman, RD, LDN, CDE Pager: [phone] After hours Pager: [phone]

## 2015-03-26 NOTE — Progress Notes (Signed)
ANTICOAGULATION CONSULT NOTE - Initial Consult  Pharmacy Consult for heparin Indication: chest pain/ACS  Allergies  Allergen Reactions  . Toradol [Ketorolac Tromethamine] Itching and Rash  . Tramadol Itching and Rash  . Zofran [Ondansetron Hcl] Itching and Rash    Patient Measurements: Height: 5\' 3"  (160 cm) Weight: 195 lb 1.7 oz (88.5 kg) IBW/kg (Calculated) : 52.4 Heparin Dosing Weight: 75kg  Vital Signs: Temp: 97.7 F (36.5 C) (08/01 0200) Temp Source: Core (Comment) (08/01 0000) BP: 120/99 mmHg (08/01 0200) Pulse Rate: 65 (08/01 0200)  Labs:  Recent Labs  03/25/15 1435 03/25/15 1917 03/25/15 2140  HGB 14.4 16.2*  --   HCT 44.3 48.7*  --   PLT 283 310  --   APTT  --   --  25  LABPROT  --  13.6  --   INR  --  1.02  --   CREATININE 0.99 0.84 0.70  TROPONINI 0.57*  --  2.76*    Estimated Creatinine Clearance: 91.7 mL/min (by C-G formula based on Cr of 0.7).   Medical History: Past Medical History  Diagnosis Date  . Hypertension   . High cholesterol   . Anxiety   . Depression   . Allergy     Medications:  Prescriptions prior to admission  Medication Sig Dispense Refill Last Dose  . acetaminophen (TYLENOL) 500 MG tablet Take 500 mg by mouth every 6 (six) hours as needed for pain.   Not Taking  . albuterol (PROVENTIL HFA;VENTOLIN HFA) 108 (90 BASE) MCG/ACT inhaler Inhale 2 puffs into the lungs every 6 (six) hours as needed for wheezing or shortness of breath. (Patient not taking: Reported on 03/12/2015) 1 Inhaler 0 Not Taking  . amoxicillin (AMOXIL) 875 MG tablet Take 1 tablet (875 mg total) by mouth 2 (two) times daily. (Patient not taking: Reported on 03/12/2015) 20 tablet 0 Not Taking  . DULoxetine (CYMBALTA) 60 MG capsule Take 60 mg by mouth daily.   Taking  . fluticasone (FLONASE) 50 MCG/ACT nasal spray Place 1 spray into both nostrils daily. 16 g 0 Taking  . furosemide (LASIX) 40 MG tablet Take 40 mg by mouth daily.   Taking  . gabapentin (NEURONTIN)  300 MG capsule Take 300 mg by mouth as needed.    Taking  . HYDROcodone-acetaminophen (NORCO/VICODIN) 5-325 MG per tablet Take 2 tablets by mouth every 4 (four) hours as needed for pain. (Patient not taking: Reported on 03/12/2015) 10 tablet 0 Not Taking  . metoCLOPramide (REGLAN) 5 MG tablet Take 1 tablet (5 mg total) by mouth every 6 (six) hours as needed for nausea. (Patient not taking: Reported on 03/12/2015) 10 tablet 0 Not Taking  . metoprolol succinate (TOPROL-XL) 100 MG 24 hr tablet Take 100 mg by mouth daily. Take with or immediately following a meal.   Taking  . naproxen (NAPROSYN) 500 MG tablet Take 1 tablet (500 mg total) by mouth 2 (two) times daily with a meal. (Patient not taking: Reported on 03/12/2015) 20 tablet 0 Not Taking  . promethazine (PHENERGAN) 25 MG tablet Take 1 tablet (25 mg total) by mouth every 6 (six) hours as needed for nausea. (Patient not taking: Reported on 03/12/2015) 20 tablet 0 Not Taking  . topiramate (TOPAMAX) 100 MG tablet Take 100 mg by mouth 2 (two) times daily.   Taking   Scheduled:  . albuterol  2.5 mg Nebulization Q4H  . antiseptic oral rinse  7 mL Mouth Rinse QID  . chlorhexidine  15 mL Mouth Rinse BID  .  famotidine (PEPCID) IV  20 mg Intravenous Q12H  . heparin  5,000 Units Subcutaneous 3 times per day  . levETIRAcetam  500 mg Intravenous Q12H  . potassium chloride  10 mEq Intravenous Q1 Hr x 4   Infusions:  . norepinephrine (LEVOPHED) Adult infusion    . propofol (DIPRIVAN) infusion 20 mcg/kg/min (03/25/15 2202)    Assessment: 49yo female was noted by coworkers to be having a seizure, EMS called, AED advised shock for Vfib vs Vtach arrest, status epilepticus lasted ~43min, currently on 36-degree cooling after pt started to follow commands, now w/ rising troponin, head CT clear, to begin heparin.  Goal of Therapy:  Heparin level 0.3-0.7 units/ml Monitor platelets by anticoagulation protocol: Yes   Plan:  Will begin heparin gtt at 1000 units/hr  and monitor heparin levels and CBC.  Wynona Neat, PharmD, BCPS  03/26/2015,3:36 AM

## 2015-03-26 NOTE — Progress Notes (Signed)
PULMONARY / CRITICAL CARE MEDICINE   Name: Claire Mcdonald MRN: [id number] DOB: 03-Apr-1966    ADMISSION DATE:  03/25/2015  REFERRING MD :  [facility]  CHIEF COMPLAINT:  V-Fib/V-tach cardiac arrest and status epilepticus  INITIAL PRESENTATION: Noted by coworkers at [facility] [employer] to be having seizure-like activity. Received shock by AED with loss of consciouness.  STUDIES:  7/31 Head CT - no acute findings 7/31 Neck CT - no acute findings  SIGNIFICANT EVENTS: 7/31 - Appeared to be having seizure like activity on admission.  Neurology consulted.  Movements stopped on their own.   SUBJECTIVE:  No acute events overnight. Heparin drip started per protocol given elevated troponin I. Patient with purposeful movements reported by RN.  ROS:  Unobtainable as patient is intubated & sedated.  VITAL SIGNS: Temp:  [94.8 F (34.9 C)-98.1 F (36.7 C)] 97 F (36.1 C) (08/01 1000) Pulse Rate:  [51-96] 82 (08/01 1102) Resp:  [6-27] 19 (08/01 1102) BP: (91-155)/(42-110) 117/71 mmHg (08/01 1102) SpO2:  [95 %-100 %] 100 % (08/01 1102) FiO2 (%):  [30 %-40 %] 30 % (08/01 0800) Weight:  [195 lb (88.451 kg)-217 lb 6 oz (98.6 kg)] 217 lb 6 oz (98.6 kg) (08/01 0400) HEMODYNAMICS:   VENTILATOR SETTINGS: Vent Mode:  [-] PRVC FiO2 (%):  [30 %-40 %] 30 % Set Rate:  [14 bmp] 14 bmp Vt Set:  [420 mL-500 mL] 420 mL PEEP:  [5 cmH20] 5 cmH20 Plateau Pressure:  [14 cmH20-17 cmH20] 17 cmH20 INTAKE / OUTPUT:  Intake/Output Summary (Last 24 hours) at 03/26/15 1119 Last data filed at 03/26/15 1100  Gross per 24 hour  Intake 654.51 ml  Output    250 ml  Net 404.51 ml    PHYSICAL EXAMINATION: General:  Sedated. No acute distress. Currently on ventilator. Integument:  Warm & dry. No rash on exposed skin. No bruising. HEENT:  Moist mucus membranes. No scleral injection or icterus. Endotracheal tube in place. PERRL. Cardiovascular:  Regular rate. No edema. No appreciable JVD.  Pulmonary:  Good aeration &  clear to auscultation bilaterally. Symmetric chest wall expansion. No accessory muscle use on ventilator. Abdomen: Soft. Normal bowel sounds. Nondistended.  Neurological:  Wiggling toes on command. Moving hands on command. Patient will open eyes and attend to voice. Does not give a thumbs up on command.  LABS:  CBC  Recent Labs Lab 03/25/15 1435 03/25/15 1917 03/26/15 0500  WBC 8.8 16.9* 15.9*  HGB 14.4 16.2* 14.0  HCT 44.3 48.7* 42.1  PLT 283 310 260   Coag's  Recent Labs Lab 03/25/15 1917 03/25/15 2140  APTT  --  25  INR 1.02  --    BMET  Recent Labs Lab 03/25/15 1917 03/25/15 2140 03/26/15 0500  NA 141 139 139  K 5.1 3.1* 3.8  CL 108 110 113*  CO2 21* 22 22  BUN 15 16 11   CREATININE 0.84 0.70 0.61  GLUCOSE 85 108* 99   Electrolytes  Recent Labs Lab 03/25/15 1917 03/25/15 2140 03/26/15 0500  CALCIUM 8.9 7.8* 8.1*  MG 2.5*  --  2.2  PHOS 3.8  --  3.7   Sepsis Markers  Recent Labs Lab 03/25/15 1917 03/25/15 2140 03/26/15 0145  LATICACIDVEN 3.1*  --  0.8  PROCALCITON  --  <0.10  --    ABG  Recent Labs Lab 03/25/15 1612 03/25/15 2155 03/26/15 0428  PHART 7.31* 7.356 7.346*  PCO2ART 40 35.7 34.8*  PO2ART 82* 194* 122.0*   Liver Enzymes  Recent  Labs Lab 03/25/15 1435 03/25/15 2140  AST 112* 113*  ALT 86* 71*  ALKPHOS 55 43  BILITOT 0.8 0.8  ALBUMIN 3.8 2.9*   Cardiac Enzymes  Recent Labs Lab 03/25/15 2140 03/26/15 0245 03/26/15 0500  TROPONINI 2.76* 5.13* 6.34*   Glucose  Recent Labs Lab 03/25/15 2138 03/25/15 2355 03/26/15 0027 03/26/15 0452 03/26/15 0725  GLUCAP 105* 67 104* 94 71    Imaging Dg Abd 1 View  03/25/2015   CLINICAL DATA:  OG tube placement, cardiac arrest  EXAM: ABDOMEN - 1 VIEW  COMPARISON:  CT abdomen pelvis dated 12/30/2014  FINDINGS: Enteric tube is looped in the stomach and terminates in the gastric cardia.  Nonobstructive bowel gas pattern.  Defibrillator pads overlie the lower chest/upper  abdomen.  IMPRESSION: Enteric tube is looped in the stomach and terminates in the gastric cardia.   Electronically Signed   By: Julian Hy M.D.   On: 03/25/2015 16:10   Ct Head Wo Contrast  03/25/2015   CLINICAL DATA:  Found on floor at work.  Unresponsive.  EXAM: CT HEAD WITHOUT CONTRAST  CT CERVICAL SPINE WITHOUT CONTRAST  TECHNIQUE: Multidetector CT imaging of the head and cervical spine was performed following the standard protocol without intravenous contrast. Multiplanar CT image reconstructions of the cervical spine were also generated.  COMPARISON:  10/27/2012  FINDINGS: CT HEAD FINDINGS  No acute intracranial abnormality. Specifically, no hemorrhage, hydrocephalus, mass lesion, acute infarction, or significant intracranial injury. No acute calvarial abnormality. Visualized paranasal sinuses and mastoids clear. Orbital soft tissues unremarkable.  CT CERVICAL SPINE FINDINGS  Motion artifact degrades image quality. No fracture visualized. No malalignment. Prevertebral soft tissues are normal. No epidural or paraspinal hematoma.  IMPRESSION: No intracranial abnormality.  No acute bony abnormality in the cervical spine.   Electronically Signed   By: Rolm Baptise M.D.   On: 03/25/2015 15:06   Ct Cervical Spine Wo Contrast  03/25/2015   CLINICAL DATA:  Found on floor at work.  Unresponsive.  EXAM: CT HEAD WITHOUT CONTRAST  CT CERVICAL SPINE WITHOUT CONTRAST  TECHNIQUE: Multidetector CT imaging of the head and cervical spine was performed following the standard protocol without intravenous contrast. Multiplanar CT image reconstructions of the cervical spine were also generated.  COMPARISON:  10/27/2012  FINDINGS: CT HEAD FINDINGS  No acute intracranial abnormality. Specifically, no hemorrhage, hydrocephalus, mass lesion, acute infarction, or significant intracranial injury. No acute calvarial abnormality. Visualized paranasal sinuses and mastoids clear. Orbital soft tissues unremarkable.  CT CERVICAL  SPINE FINDINGS  Motion artifact degrades image quality. No fracture visualized. No malalignment. Prevertebral soft tissues are normal. No epidural or paraspinal hematoma.  IMPRESSION: No intracranial abnormality.  No acute bony abnormality in the cervical spine.   Electronically Signed   By: Rolm Baptise M.D.   On: 03/25/2015 15:06   Dg Chest Port 1 View  03/25/2015   CLINICAL DATA:  Central line placement.  Hypothermia.  EXAM: PORTABLE CHEST - 1 VIEW  COMPARISON:  03/25/2015.  FINDINGS: Support apparatus: Endotracheal tube tip 1 cm from the carina. Enteric tube is present doubled back upon itself with the tip at the cardia of the stomach.  New LEFT subclavian central line is present. The tip is in the lower superior vena cava at the cavoatrial junction.  Cardiomediastinal Silhouette:  Unchanged.  Lungs: Opacity at the LEFT costophrenic angle, likely representing atelectasis. Airspace disease could also produce this appearance. No pneumothorax.  Effusions:  None.  Other:  None.  IMPRESSION: 1. Support  apparatus as described above. The endotracheal tube has been retracted but remains 1 cm from the carina. 2. Uncomplicated interval placement of LEFT subclavian central line with the tip at the junction of the superior vena cava and RIGHT atrium. 3. Opacification at the LEFT costophrenic angle which may represent atelectasis or airspace disease.   Electronically Signed   By: Dereck Ligas M.D.   On: 03/25/2015 21:01   Dg Chest Port 1 View  03/25/2015   CLINICAL DATA:  Intubation, cardiac arrest.  EXAM: PORTABLE CHEST - 1 VIEW  COMPARISON:  01/23/2015  FINDINGS: Endotracheal tube has tip in the origin of the left mainstem bronchus. This could be pulled back approximately 3 cm. Nasogastric tube is coiled once over the stomach with tip and side-port over the stomach in the left upper quadrant.  Lungs are hypoinflated without focal consolidation or effusion. Cardiomediastinal silhouette is within normal. Remainder  of the exam is unchanged.  IMPRESSION: Hypoinflation without acute cardiopulmonary disease.  Tubes and lines as described. Note that the endotracheal tube has tip over the origin of the left mainstem bronchus.  These results were called by telephone at the time of interpretation on 03/25/2015 at 4:03 pm to Dr. Thomasene Lot, who verbally acknowledged these results.   Electronically Signed   By: Marin Olp M.D.   On: 03/25/2015 16:03     ASSESSMENT / PLAN:  PULMONARY OETT 7.5, 19cm at the lip, placed in ED 7/31 >> A:  Intubated for airway protection and apnea post arrest  P:   Vent bundle Daily SBT  CARDIOVASCULAR CVL Left subclavian placed 7/31 >> A:  S/P v-fib vs. V-tach arrest  P:  36C cooling protocol Continue monitoring Continuing heparin drip per protocol Awaiting echocardiogram Consult cardiology for recommendations  RENAL A:   No acute issues  P:   Trending urine output with Foley catheter Daily BUN/creatinine Repeat electrolytes at 1500 hrs.  GASTROINTESTINAL A:   No acute issues  P:   OG tube in place NPO Bowel regimen  HEMATOLOGIC A:   No acute issues  P:  Monitor CBC daily while on heparin drip Heparin drip per protocol  INFECTIOUS A:   No acute issues  P:   Monitor for signs and sx of infection  ENDOCRINE A:   No current issues    P:   Accu-Cheks every 4 hours  NEUROLOGIC A:   Seizure - status epilepticus  P:   Neurology following RASS goal: 0 to -1 Continuing propofol drip Continuing Keppra Plan for EEG upon warming   FAMILY  - Updates: No family present today.  - Inter-disciplinary family meet or Palliative Care meeting due by:  8/7   TODAY'S SUMMARY: Admitted after v-fib/v-tach arrest and status epilepticus.  Being cooled.  Has had some purposeful movement when sedation was held.   I have spent a total of 33 minutes of critical care time today caring for this patient, contacting cardiology for their consultation, and  reviewing the patient's electronic medical record.   Sonia Baller Ashok Cordia, M.D. [facility] Pager:  [phone] After 3pm or if no response, call [phone]   03/26/2015, 11:19 AM

## 2015-03-27 ENCOUNTER — Inpatient Hospital Stay (HOSPITAL_COMMUNITY): Payer: Medicaid Other

## 2015-03-27 ENCOUNTER — Encounter (HOSPITAL_COMMUNITY)
Admission: AD | Disposition: A | Discharge status: Home / Self Care | Payer: Medicaid Other | Source: Other Acute Inpatient Hospital | Attending: Internal Medicine

## 2015-03-27 DIAGNOSIS — E785 Hyperlipidemia, unspecified: Secondary | ICD-10-CM

## 2015-03-27 DIAGNOSIS — I2510 Atherosclerotic heart disease of native coronary artery without angina pectoris: Secondary | ICD-10-CM

## 2015-03-27 DIAGNOSIS — R569 Unspecified convulsions: Secondary | ICD-10-CM

## 2015-03-27 DIAGNOSIS — I469 Cardiac arrest, cause unspecified: Secondary | ICD-10-CM

## 2015-03-27 HISTORY — PX: CARDIAC CATHETERIZATION: SHX172

## 2015-03-27 LAB — GLUCOSE, CAPILLARY
GLUCOSE-CAPILLARY: 116 mg/dL — AB (ref 65–99)
GLUCOSE-CAPILLARY: 116 mg/dL — AB (ref 65–99)
Glucose-Capillary: 136 mg/dL — ABNORMAL HIGH (ref 65–99)
Glucose-Capillary: 87 mg/dL (ref 65–99)
Glucose-Capillary: 92 mg/dL (ref 65–99)
Glucose-Capillary: 82 mg/dL (ref 65–99)
Glucose-Capillary: 101 mg/dL — ABNORMAL HIGH (ref 65–99)

## 2015-03-27 LAB — BASIC METABOLIC PANEL
ANION GAP: 7 (ref 5–15)
Anion gap: 8 (ref 5–15)
BUN: 6 mg/dL (ref 6–20)
CALCIUM: 8.1 mg/dL — AB (ref 8.9–10.3)
CHLORIDE: 112 mmol/L — AB (ref 101–111)
CO2: 23 mmol/L (ref 22–32)
CO2: 22 mmol/L (ref 22–32)
CREATININE: 0.7 mg/dL (ref 0.44–1.00)
CREATININE: 0.75 mg/dL (ref 0.44–1.00)
Calcium: 8 mg/dL — ABNORMAL LOW (ref 8.9–10.3)
Chloride: 110 mmol/L (ref 101–111)
GFR calc Af Amer: >60 mL/min (ref >60)
GFR calc Af Amer: >60 mL/min (ref >60)
GFR calc non Af Amer: >60 mL/min (ref >60)
GFR calc non Af Amer: >60 mL/min (ref >60)
GLUCOSE: 101 mg/dL — AB (ref 65–99)
Glucose, Bld: 110 mg/dL — ABNORMAL HIGH (ref 65–99)
POTASSIUM: 3.7 mmol/L (ref 3.5–5.1)
Potassium: 4 mmol/L (ref 3.5–5.1)
SODIUM: 141 mmol/L (ref 135–145)
SODIUM: 141 mmol/L (ref 135–145)

## 2015-03-27 LAB — CBC
HEMATOCRIT: 38.3 % (ref 36.0–46.0)
HEMATOCRIT: 36.9 % (ref 36.0–46.0)
HEMOGLOBIN: 12.7 g/dL (ref 12.0–15.0)
HEMOGLOBIN: 12.2 g/dL (ref 12.0–15.0)
MCH: 30.3 pg (ref 26.0–34.0)
MCH: 30.4 pg (ref 26.0–34.0)
MCHC: 33.2 g/dL (ref 30.0–36.0)
MCHC: 33.1 g/dL (ref 30.0–36.0)
MCV: 91.4 fL (ref 78.0–100.0)
MCV: 92 fL (ref 78.0–100.0)
PLATELETS: 268 10*3/uL (ref 150–400)
Platelets: 265 10*3/uL (ref 150–400)
RBC: 4.19 MIL/uL (ref 3.87–5.11)
RBC: 4.01 MIL/uL (ref 3.87–5.11)
RDW: 14.2 % (ref 11.5–15.5)
RDW: 14.3 % (ref 11.5–15.5)
WBC: 16.7 10*3/uL — ABNORMAL HIGH (ref 4.0–10.5)
WBC: 15.7 10*3/uL — AB (ref 4.0–10.5)

## 2015-03-27 LAB — TROPONIN I: TROPONIN I: 4.31 ng/mL — AB (ref <0.031)

## 2015-03-27 LAB — HEPATIC FUNCTION PANEL
ALT: 61 U/L — ABNORMAL HIGH (ref 14–54)
AST: 72 U/L — ABNORMAL HIGH (ref 15–41)
Albumin: 2.8 g/dL — ABNORMAL LOW (ref 3.5–5.0)
Alkaline Phosphatase: 51 U/L (ref 38–126)
BILIRUBIN DIRECT: 0.2 mg/dL (ref 0.1–0.5)
BILIRUBIN INDIRECT: 0.7 mg/dL (ref 0.3–0.9)
BILIRUBIN TOTAL: 0.9 mg/dL (ref 0.3–1.2)
Total Protein: 5.5 g/dL — ABNORMAL LOW (ref 6.5–8.1)

## 2015-03-27 LAB — RENAL FUNCTION PANEL
ANION GAP: 9 (ref 5–15)
Albumin: 2.8 g/dL — ABNORMAL LOW (ref 3.5–5.0)
BUN: 7 mg/dL (ref 6–20)
CO2: 22 mmol/L (ref 22–32)
Calcium: 7.9 mg/dL — ABNORMAL LOW (ref 8.9–10.3)
Chloride: 108 mmol/L (ref 101–111)
Creatinine, Ser: 0.74 mg/dL (ref 0.44–1.00)
GFR calc non Af Amer: >60 mL/min (ref >60)
GLUCOSE: 127 mg/dL — AB (ref 65–99)
Phosphorus: 3.6 mg/dL (ref 2.5–4.6)
Potassium: 2.9 mmol/L — ABNORMAL LOW (ref 3.5–5.1)
SODIUM: 139 mmol/L (ref 135–145)

## 2015-03-27 LAB — HEPARIN LEVEL (UNFRACTIONATED)
HEPARIN UNFRACTIONATED: 0.64 [IU]/mL (ref 0.30–0.70)
Heparin Unfractionated: 0.66 IU/mL (ref 0.30–0.70)

## 2015-03-27 LAB — MAGNESIUM: Magnesium: 2 mg/dL (ref 1.7–2.4)

## 2015-03-27 LAB — POCT ACTIVATED CLOTTING TIME: Activated Clotting Time: 128 seconds

## 2015-03-27 SURGERY — LEFT HEART CATH AND CORONARY ANGIOGRAPHY
Anesthesia: LOCAL

## 2015-03-27 MED ORDER — LIDOCAINE HCL (PF) 1 % IJ SOLN
INTRAMUSCULAR | Status: DC | PRN
  Administered 2015-03-27: 20 mL via INTRADERMAL

## 2015-03-27 MED ORDER — ONDANSETRON HCL 4 MG/2ML IJ SOLN: 4.0000 mg | Freq: Four times a day (QID) | INTRAMUSCULAR | Status: DC | PRN

## 2015-03-27 MED ORDER — HEPARIN (PORCINE) IN NACL 2-0.9 UNIT/ML-% IJ SOLN
INTRAMUSCULAR | Status: AC
  Filled 2015-03-27: qty 500

## 2015-03-27 MED ORDER — SODIUM CHLORIDE 0.9 % IJ SOLN: 3.0000 mL | INTRAMUSCULAR | Status: DC | PRN

## 2015-03-27 MED ORDER — HEPARIN SODIUM (PORCINE) 5000 UNIT/ML IJ SOLN
5000.0000 [IU] | Freq: Three times a day (TID) | INTRAMUSCULAR | Status: DC
  Administered 2015-03-28 – 2015-04-01 (×14): 5000 [IU] via SUBCUTANEOUS
  Filled 2015-03-27 (×16): qty 1

## 2015-03-27 MED ORDER — ATORVASTATIN CALCIUM 80 MG PO TABS
80.0000 mg | ORAL_TABLET | Freq: Every day | ORAL | Status: DC
  Administered 2015-03-27 – 2015-04-11 (×16): 80 mg via ORAL
  Filled 2015-03-27 (×17): qty 1

## 2015-03-27 MED ORDER — SODIUM CHLORIDE 0.9 % IV SOLN: 250.0000 mL | INTRAVENOUS | Status: DC | PRN

## 2015-03-27 MED ORDER — ASPIRIN 81 MG PO CHEW: 81.0000 mg | CHEWABLE_TABLET | ORAL | Status: AC

## 2015-03-27 MED ORDER — SODIUM CHLORIDE 0.9 % IJ SOLN
3.0000 mL | Freq: Two times a day (BID) | INTRAMUSCULAR | Status: DC
  Administered 2015-03-27 – 2015-03-29 (×4): 3 mL via INTRAVENOUS

## 2015-03-27 MED ORDER — PROMETHAZINE HCL 25 MG/ML IJ SOLN: 12.5000 mg | Freq: Four times a day (QID) | INTRAMUSCULAR | Status: DC | PRN

## 2015-03-27 MED ORDER — POTASSIUM CHLORIDE 10 MEQ/50ML IV SOLN
10.0000 meq | INTRAVENOUS | Status: AC
  Administered 2015-03-27 (×8): 10 meq via INTRAVENOUS
  Filled 2015-03-27 (×8): qty 50

## 2015-03-27 MED ORDER — HEPARIN (PORCINE) IN NACL 2-0.9 UNIT/ML-% IJ SOLN
INTRAMUSCULAR | Status: DC | PRN
  Administered 2015-03-27: 15:00:00

## 2015-03-27 MED ORDER — ASPIRIN 81 MG PO CHEW
81.0000 mg | CHEWABLE_TABLET | Freq: Every day | ORAL | Status: DC
Start: 2015-03-27 — End: 2015-04-05
  Administered 2015-03-27 – 2015-04-05 (×9): 81 mg via ORAL
  Filled 2015-03-27 (×9): qty 1

## 2015-03-27 MED ORDER — ACETAMINOPHEN 325 MG PO TABS
650.0000 mg | ORAL_TABLET | ORAL | Status: DC | PRN
  Administered 2015-03-29 – 2015-04-02 (×2): 650 mg via ORAL
  Filled 2015-03-27 (×3): qty 2

## 2015-03-27 MED ORDER — LIDOCAINE HCL (PF) 1 % IJ SOLN
INTRAMUSCULAR | Status: AC
Start: 2015-03-27 — End: 2015-03-27
  Filled 2015-03-27: qty 30

## 2015-03-27 MED ORDER — SODIUM CHLORIDE 0.9 % IV SOLN
INTRAVENOUS | Status: DC
  Administered 2015-03-27: 15:00:00 via INTRAVENOUS

## 2015-03-27 MED ORDER — SODIUM CHLORIDE 0.9 % IJ SOLN
3.0000 mL | Freq: Two times a day (BID) | INTRAMUSCULAR | Status: DC
  Administered 2015-03-27: 3 mL via INTRAVENOUS

## 2015-03-27 MED ORDER — SODIUM CHLORIDE 0.9 % IV SOLN
INTRAVENOUS | Status: DC
  Administered 2015-03-27: 12:00:00 via INTRAVENOUS

## 2015-03-27 MED ORDER — IOHEXOL 350 MG/ML SOLN
INTRAVENOUS | Status: DC | PRN
  Administered 2015-03-27: 55 mL via INTRA_ARTERIAL

## 2015-03-27 SURGICAL SUPPLY — 8 items
CATH INFINITI 5FR MULTPACK ANG (CATHETERS) ×2 IMPLANT
HOVERMATT SINGLE USE (MISCELLANEOUS) ×2 IMPLANT
KIT HEART LEFT (KITS) ×3 IMPLANT
PACK CARDIAC CATHETERIZATION (CUSTOM PROCEDURE TRAY) ×3 IMPLANT
SHEATH PINNACLE 5F 10CM (SHEATH) ×2 IMPLANT
SYR MEDRAD MARK V 150ML (SYRINGE) ×3 IMPLANT
TRANSDUCER W/STOPCOCK (MISCELLANEOUS) ×3 IMPLANT
WIRE EMERALD 3MM-J .035X150CM (WIRE) ×2 IMPLANT

## 2015-03-27 NOTE — Procedures (Signed)
History: 49 yo F s/p cardiac arrest.   Sedation: Propofol  Technique: This is a 19 channel routine scalp EEG performed at the bedside with bipolar and monopolar montages arranged in accordance to the international 10/20 system of electrode placement. One channel was dedicated to EKG recording.    Background: The background consists of intermixed alpha and beta activities with mild generalized delta seen at times. There is marked movement artifact during most of this study. There is a moderately well defined posterior dominant rhythm of 8 Hz. Sleep is recorded.   Photic stimulation: Physiologic driving is not performed  EEG Abnormalities: 1) mild generalized delta during wakefullness  Clinical Interpretation: This EEG is mildly abnormal consistent with a mld generalized non-specific cerebral dysfunction(encephalopathy) as can bee seen with sedating medications among other causes.There was no seizure or seizure predisposition recorded on this study.   Claire Rack, MD [facility] [phone]  If 7pm- 7am, please page neurology on call as listed in [place].

## 2015-03-27 NOTE — Interval H&P Note (Signed)
Cath Lab Visit (complete for each Cath Lab visit)  Clinical Evaluation Leading to the Procedure:   ACS: Yes.    Non-ACS:    Anginal Classification: CCS IV  Anti-ischemic medical therapy: Maximal Therapy (2 or more classes of medications)  Non-Invasive Test Results: No non-invasive testing performed  Prior CABG: No previous CABG      History and Physical Interval Note:  03/27/2015 2:17 PM  Claire Mcdonald  has presented today for surgery, with the diagnosis of post arrest  The various methods of treatment have been discussed with the patient and family. After consideration of risks, benefits and other options for treatment, the patient has consented to  Procedure(s): Left Heart Cath and Coronary Angiography (N/A) as a surgical intervention .  The patient's history has been reviewed, patient examined, no change in status, stable for surgery.  I have reviewed the patient's chart and labs.  Questions were answered to the patient's satisfaction.     Rihan Schueler A

## 2015-03-27 NOTE — Progress Notes (Signed)
*  PRELIMINARY RESULTS* Echocardiogram 2D Echocardiogram has been performed.  Claire Mcdonald 03/27/2015, 11:11 AM

## 2015-03-27 NOTE — Progress Notes (Signed)
ANTICOAGULATION CONSULT NOTE - Follow Up Consult  Pharmacy Consult for Heparin Indication: Elevated troponins s/p cardiac arrest  Allergies  Allergen Reactions  . Toradol [Ketorolac Tromethamine] Itching and Rash  . Tramadol Itching and Rash  . Zofran [Ondansetron Hcl] Itching and Rash    Patient Measurements: Height: 5\' 3"  (160 cm) Weight: 219 lb 2.2 oz (99.4 kg) IBW/kg (Calculated) : 52.4 Heparin Dosing Weight: 75 kg  Vital Signs: Temp: 98.1 F (36.7 C) (08/02 1000) Temp Source: Core (Comment) (08/02 1000) BP: 98/56 mmHg (08/02 1000) Pulse Rate: 90 (08/02 1000)  Labs:  Recent Labs  03/25/15 1917 03/25/15 2140  03/26/15 0500  03/26/15 1459 03/26/15 1500 03/26/15 1501 03/26/15 1520 03/26/15 1935 03/27/15 03/27/15 0155 03/27/15 0435  HGB 16.2*  --   --  14.0  --   --   --   --   --   --   --   --  12.7  HCT 48.7*  --   --  42.1  --   --   --   --   --   --   --   --  38.3  PLT 310  --   --  260  --   --   --   --   --   --   --   --  265  APTT  --  25  --   --   --   --  86*  --   --   --   --   --   --   LABPROT 13.6  --   --   --   --   --  14.7  --   --   --   --   --   --   INR 1.02  --   --   --   --   --  1.13  --   --   --   --   --   --   HEPARINUNFRC  --   --   --   --   < > 0.69  --   --   --   --  0.64  --  0.66  CREATININE 0.84 0.70  --  0.61  --   --   --  0.62  --   --   --   --  0.74  TROPONINI  --  2.76*  < > 6.34*  --   --   --   --  6.14* 4.67*  --  4.31*  --   < > = values in this interval not displayed.  Estimated Creatinine Clearance: 97.7 mL/min (by C-G formula based on Cr of 0.74).  Assessment: 49yo female continues on heparin gtt s/p cardiac arrest with elevated troponin. Pt currently on hypothermia protocol.  Pharmacy consulted to dose heparin.  PMH HTN, chronic pain, depression  Anticoagulation/Heme: Heparin drip for elevated troponin s/p cardiac arrest with HL 0.66, therapeutic CBC WNL On heparin 900 units/h  Goal of Therapy:   Heparin level 0.3-0.7 units/ml Monitor platelets by anticoagulation protocol: Yes   Plan:  Continue heparin 900 units/hr Daily HL and CBC  Heloise Ochoa, [place].D. PGY2 Cardiology Pharmacy Resident   03/27/2015,10:56 AM

## 2015-03-27 NOTE — Progress Notes (Signed)
[facility] ADULT ICU REPLACEMENT PROTOCOL FOR AM LAB REPLACEMENT ONLY  The patient does apply for the [facility] Adult ICU Electrolyte Replacment Protocol based on the criteria listed below:   1. Is GFR >/= 40 ml/min? Yes.    Patient's GFR today is >60 2. Is urine output >/= 0.5 ml/kg/hr for the last 6 hours? Yes.   Patient's UOP is 0.5 ml/kg/hr 3. Is BUN < 60 mg/dL? Yes.    Patient's BUN today is 7 4. Abnormal electrolyte(s): K2.9 5. Ordered repletion with: perprotocol 6. If a panic level lab has been reported, has the CCM MD in charge been notified? No..   Physician:    Ronda Fairly A 03/27/2015 5:47 AM

## 2015-03-27 NOTE — H&P (View-Only) (Signed)
Subjective:  Able to follow commands, alert off sedation. Intubated.   Objective:  Vital Signs in the last 24 hours: Temp:  [95.7 F (35.4 C)-99 F (37.2 C)] 98.6 F (37 C) (08/02 0800) Pulse Rate:  [63-101] 90 (08/02 0830) Resp:  [11-23] 15 (08/02 0830) BP: (53-176)/(24-105) 132/82 mmHg (08/02 0830) SpO2:  [94 %-100 %] 100 % (08/02 0830) FiO2 (%):  [30 %] 30 % (08/02 0953) Weight:  [219 lb 2.2 oz (99.4 kg)] 219 lb 2.2 oz (99.4 kg) (08/02 0300)  Intake/Output from previous day: 08/01 0701 - 08/02 0700 In: 1821.5 [I.V.:1461.5; IV Piggyback:360] Out: 9892 [Urine:875; Emesis/NG output:400]   Physical Exam: General: Well developed, well nourished, in no acute distress. Head:  Normocephalic and atraumatic. Intubated Lungs: Clear to auscultation and percussion. Heart: Normal S1 and S2.  No murmur, rubs or gallops.  Abdomen: soft, non-tender, positive bowel sounds. Obese Extremities: No clubbing or cyanosis. No edema.  Neurologic: Able to answer questions off propofol, no seizure.    Lab Results:  Recent Labs  03/26/15 0500 03/27/15 0435  WBC 15.9* 16.7*  HGB 14.0 12.7  PLT 260 265    Recent Labs  03/26/15 1501 03/27/15 0435  NA 140 139  K 3.5 2.9*  CL 110 108  CO2 23 22  GLUCOSE 102* 127*  BUN 10 7  CREATININE 0.62 0.74    Recent Labs  03/26/15 1935 03/27/15 0155  TROPONINI 4.67* 4.31*   Hepatic Function Panel  Recent Labs  03/27/15 0435  PROT 5.5*  ALBUMIN 2.8*  2.8*  AST 72*  ALT 61*  ALKPHOS 51  BILITOT 0.9  BILIDIR 0.2  IBILI 0.7   No results for input(s): CHOL in the last 72 hours. No results for input(s): PROTIME in the last 72 hours.  Imaging: Dg Abd 1 View  03/25/2015   CLINICAL DATA:  OG tube placement, cardiac arrest  EXAM: ABDOMEN - 1 VIEW  COMPARISON:  CT abdomen pelvis dated 12/30/2014  FINDINGS: Enteric tube is looped in the stomach and terminates in the gastric cardia.  Nonobstructive bowel gas pattern.  Defibrillator  pads overlie the lower chest/upper abdomen.  IMPRESSION: Enteric tube is looped in the stomach and terminates in the gastric cardia.   Electronically Signed   By: Julian Hy M.D.   On: 03/25/2015 16:10   Ct Head Wo Contrast  03/25/2015   CLINICAL DATA:  Found on floor at work.  Unresponsive.  EXAM: CT HEAD WITHOUT CONTRAST  CT CERVICAL SPINE WITHOUT CONTRAST  TECHNIQUE: Multidetector CT imaging of the head and cervical spine was performed following the standard protocol without intravenous contrast. Multiplanar CT image reconstructions of the cervical spine were also generated.  COMPARISON:  10/27/2012  FINDINGS: CT HEAD FINDINGS  No acute intracranial abnormality. Specifically, no hemorrhage, hydrocephalus, mass lesion, acute infarction, or significant intracranial injury. No acute calvarial abnormality. Visualized paranasal sinuses and mastoids clear. Orbital soft tissues unremarkable.  CT CERVICAL SPINE FINDINGS  Motion artifact degrades image quality. No fracture visualized. No malalignment. Prevertebral soft tissues are normal. No epidural or paraspinal hematoma.  IMPRESSION: No intracranial abnormality.  No acute bony abnormality in the cervical spine.   Electronically Signed   By: Rolm Baptise M.D.   On: 03/25/2015 15:06   Ct Cervical Spine Wo Contrast  03/25/2015   CLINICAL DATA:  Found on floor at work.  Unresponsive.  EXAM: CT HEAD WITHOUT CONTRAST  CT CERVICAL SPINE WITHOUT CONTRAST  TECHNIQUE: Multidetector CT imaging of the head  and cervical spine was performed following the standard protocol without intravenous contrast. Multiplanar CT image reconstructions of the cervical spine were also generated.  COMPARISON:  10/27/2012  FINDINGS: CT HEAD FINDINGS  No acute intracranial abnormality. Specifically, no hemorrhage, hydrocephalus, mass lesion, acute infarction, or significant intracranial injury. No acute calvarial abnormality. Visualized paranasal sinuses and mastoids clear. Orbital soft  tissues unremarkable.  CT CERVICAL SPINE FINDINGS  Motion artifact degrades image quality. No fracture visualized. No malalignment. Prevertebral soft tissues are normal. No epidural or paraspinal hematoma.  IMPRESSION: No intracranial abnormality.  No acute bony abnormality in the cervical spine.   Electronically Signed   By: Rolm Baptise M.D.   On: 03/25/2015 15:06   Dg Chest Port 1 View  03/25/2015   CLINICAL DATA:  Central line placement.  Hypothermia.  EXAM: PORTABLE CHEST - 1 VIEW  COMPARISON:  03/25/2015.  FINDINGS: Support apparatus: Endotracheal tube tip 1 cm from the carina. Enteric tube is present doubled back upon itself with the tip at the cardia of the stomach.  New LEFT subclavian central line is present. The tip is in the lower superior vena cava at the cavoatrial junction.  Cardiomediastinal Silhouette:  Unchanged.  Lungs: Opacity at the LEFT costophrenic angle, likely representing atelectasis. Airspace disease could also produce this appearance. No pneumothorax.  Effusions:  None.  Other:  None.  IMPRESSION: 1. Support apparatus as described above. The endotracheal tube has been retracted but remains 1 cm from the carina. 2. Uncomplicated interval placement of LEFT subclavian central line with the tip at the junction of the superior vena cava and RIGHT atrium. 3. Opacification at the LEFT costophrenic angle which may represent atelectasis or airspace disease.   Electronically Signed   By: Dereck Ligas M.D.   On: 03/25/2015 21:01   Dg Chest Port 1 View  03/25/2015   CLINICAL DATA:  Intubation, cardiac arrest.  EXAM: PORTABLE CHEST - 1 VIEW  COMPARISON:  01/23/2015  FINDINGS: Endotracheal tube has tip in the origin of the left mainstem bronchus. This could be pulled back approximately 3 cm. Nasogastric tube is coiled once over the stomach with tip and side-port over the stomach in the left upper quadrant.  Lungs are hypoinflated without focal consolidation or effusion. Cardiomediastinal  silhouette is within normal. Remainder of the exam is unchanged.  IMPRESSION: Hypoinflation without acute cardiopulmonary disease.  Tubes and lines as described. Note that the endotracheal tube has tip over the origin of the left mainstem bronchus.  These results were called by telephone at the time of interpretation on 03/25/2015 at 4:03 pm to Dr. Thomasene Lot, who verbally acknowledged these results.   Electronically Signed   By: Marin Olp M.D.   On: 03/25/2015 16:03   Personally viewed.   Telemetry: Prior NSVT on tele. None currently. NSR Personally viewed.   EKG:  NSR, TWI inf/ant subtle. Personally viewed  Cardiac Studies:  Cath P  Scheduled Meds: . albuterol  2.5 mg Nebulization Q4H  . antiseptic oral rinse  7 mL Mouth Rinse QID  . chlorhexidine  15 mL Mouth Rinse BID  . famotidine (PEPCID) IV  20 mg Intravenous Q12H  . levETIRAcetam  500 mg Intravenous Q12H  . metoprolol  5 mg Intravenous 4 times per day  . potassium chloride  10 mEq Intravenous Q1H   Continuous Infusions: . dextrose 5% lactated ringers 50 mL/hr at 03/27/15 1003  . heparin 900 Units/hr (03/27/15 0429)  . norepinephrine (LEVOPHED) Adult infusion    . propofol (DIPRIVAN)  infusion 10 mcg/kg/min (03/27/15 0930)   PRN Meds:.sodium chloride, acetaminophen, bisacodyl, fentaNYL (SUBLIMAZE) injection, fentaNYL (SUBLIMAZE) injection, sennosides  Assessment/Plan:  Principal Problem:   Cardiac arrest Active Problems:   Status epilepticus  49 year old with presumed VT/VF arrest (DC CV x 2 by AED), no evidence of current seizure activity.  1. Cardiac arrest  - plan for cath today  - Intubated only for airway protection since admission, no respiratory abnormality (discussed with CCM)  - Trop peak 6.3  - No prolonged QT  - no prior cardiac hisory  - Reviewed Dr. Meda Coffee consult note and agree with cardiac cath. Today should be reasonable and can proceed while intubated.   - Weaned off propofol and she was able to nod  her head yes to proceeding with cardiac cath. Family members Hassan Rowan and Alvester Chou - Daughter and Son, were notified of potential for cath yesterday and understand.  - ASA per NGT.  - Heparin IV - No Bb with recent need for pressor - Will add atorvastatin 80mg  NGT - ECHO pending  2. Seizure  - Keppra  - No further activity  - able to follow commands and answer questions.  3. Essential HTN  - controlled  4. HL  - adding atorvastatin 80  5. Hypokalemia  - replete per CCM  Claire Mcdonald 03/27/2015, 10:00 AM

## 2015-03-27 NOTE — Progress Notes (Signed)
EEG completed; results pending.    

## 2015-03-27 NOTE — Progress Notes (Signed)
Subjective:  Able to follow commands, alert off sedation. Intubated.   Objective:  Vital Signs in the last 24 hours: Temp:  [95.7 F (35.4 C)-99 F (37.2 C)] 98.6 F (37 C) (08/02 0800) Pulse Rate:  [63-101] 90 (08/02 0830) Resp:  [11-23] 15 (08/02 0830) BP: (53-176)/(24-105) 132/82 mmHg (08/02 0830) SpO2:  [94 %-100 %] 100 % (08/02 0830) FiO2 (%):  [30 %] 30 % (08/02 0953) Weight:  [219 lb 2.2 oz (99.4 kg)] 219 lb 2.2 oz (99.4 kg) (08/02 0300)  Intake/Output from previous day: 08/01 0701 - 08/02 0700 In: 1821.5 [I.V.:1461.5; IV Piggyback:360] Out: 7062 [Urine:875; Emesis/NG output:400]   Physical Exam: General: Well developed, well nourished, in no acute distress. Head:  Normocephalic and atraumatic. Intubated Lungs: Clear to auscultation and percussion. Heart: Normal S1 and S2.  No murmur, rubs or gallops.  Abdomen: soft, non-tender, positive bowel sounds. Obese Extremities: No clubbing or cyanosis. No edema.  Neurologic: Able to answer questions off propofol, no seizure.    Lab Results:  Recent Labs  03/26/15 0500 03/27/15 0435  WBC 15.9* 16.7*  HGB 14.0 12.7  PLT 260 265    Recent Labs  03/26/15 1501 03/27/15 0435  NA 140 139  K 3.5 2.9*  CL 110 108  CO2 23 22  GLUCOSE 102* 127*  BUN 10 7  CREATININE 0.62 0.74    Recent Labs  03/26/15 1935 03/27/15 0155  TROPONINI 4.67* 4.31*   Hepatic Function Panel  Recent Labs  03/27/15 0435  PROT 5.5*  ALBUMIN 2.8*  2.8*  AST 72*  ALT 61*  ALKPHOS 51  BILITOT 0.9  BILIDIR 0.2  IBILI 0.7   No results for input(s): CHOL in the last 72 hours. No results for input(s): PROTIME in the last 72 hours.  Imaging: Dg Abd 1 View  03/25/2015   CLINICAL DATA:  OG tube placement, cardiac arrest  EXAM: ABDOMEN - 1 VIEW  COMPARISON:  CT abdomen pelvis dated 12/30/2014  FINDINGS: Enteric tube is looped in the stomach and terminates in the gastric cardia.  Nonobstructive bowel gas pattern.  Defibrillator  pads overlie the lower chest/upper abdomen.  IMPRESSION: Enteric tube is looped in the stomach and terminates in the gastric cardia.   Electronically Signed   By: Julian Hy M.D.   On: 03/25/2015 16:10   Ct Head Wo Contrast  03/25/2015   CLINICAL DATA:  Found on floor at work.  Unresponsive.  EXAM: CT HEAD WITHOUT CONTRAST  CT CERVICAL SPINE WITHOUT CONTRAST  TECHNIQUE: Multidetector CT imaging of the head and cervical spine was performed following the standard protocol without intravenous contrast. Multiplanar CT image reconstructions of the cervical spine were also generated.  COMPARISON:  10/27/2012  FINDINGS: CT HEAD FINDINGS  No acute intracranial abnormality. Specifically, no hemorrhage, hydrocephalus, mass lesion, acute infarction, or significant intracranial injury. No acute calvarial abnormality. Visualized paranasal sinuses and mastoids clear. Orbital soft tissues unremarkable.  CT CERVICAL SPINE FINDINGS  Motion artifact degrades image quality. No fracture visualized. No malalignment. Prevertebral soft tissues are normal. No epidural or paraspinal hematoma.  IMPRESSION: No intracranial abnormality.  No acute bony abnormality in the cervical spine.   Electronically Signed   By: Rolm Baptise M.D.   On: 03/25/2015 15:06   Ct Cervical Spine Wo Contrast  03/25/2015   CLINICAL DATA:  Found on floor at work.  Unresponsive.  EXAM: CT HEAD WITHOUT CONTRAST  CT CERVICAL SPINE WITHOUT CONTRAST  TECHNIQUE: Multidetector CT imaging of the head  and cervical spine was performed following the standard protocol without intravenous contrast. Multiplanar CT image reconstructions of the cervical spine were also generated.  COMPARISON:  10/27/2012  FINDINGS: CT HEAD FINDINGS  No acute intracranial abnormality. Specifically, no hemorrhage, hydrocephalus, mass lesion, acute infarction, or significant intracranial injury. No acute calvarial abnormality. Visualized paranasal sinuses and mastoids clear. Orbital soft  tissues unremarkable.  CT CERVICAL SPINE FINDINGS  Motion artifact degrades image quality. No fracture visualized. No malalignment. Prevertebral soft tissues are normal. No epidural or paraspinal hematoma.  IMPRESSION: No intracranial abnormality.  No acute bony abnormality in the cervical spine.   Electronically Signed   By: Rolm Baptise M.D.   On: 03/25/2015 15:06   Dg Chest Port 1 View  03/25/2015   CLINICAL DATA:  Central line placement.  Hypothermia.  EXAM: PORTABLE CHEST - 1 VIEW  COMPARISON:  03/25/2015.  FINDINGS: Support apparatus: Endotracheal tube tip 1 cm from the carina. Enteric tube is present doubled back upon itself with the tip at the cardia of the stomach.  New LEFT subclavian central line is present. The tip is in the lower superior vena cava at the cavoatrial junction.  Cardiomediastinal Silhouette:  Unchanged.  Lungs: Opacity at the LEFT costophrenic angle, likely representing atelectasis. Airspace disease could also produce this appearance. No pneumothorax.  Effusions:  None.  Other:  None.  IMPRESSION: 1. Support apparatus as described above. The endotracheal tube has been retracted but remains 1 cm from the carina. 2. Uncomplicated interval placement of LEFT subclavian central line with the tip at the junction of the superior vena cava and RIGHT atrium. 3. Opacification at the LEFT costophrenic angle which may represent atelectasis or airspace disease.   Electronically Signed   By: Dereck Ligas M.D.   On: 03/25/2015 21:01   Dg Chest Port 1 View  03/25/2015   CLINICAL DATA:  Intubation, cardiac arrest.  EXAM: PORTABLE CHEST - 1 VIEW  COMPARISON:  01/23/2015  FINDINGS: Endotracheal tube has tip in the origin of the left mainstem bronchus. This could be pulled back approximately 3 cm. Nasogastric tube is coiled once over the stomach with tip and side-port over the stomach in the left upper quadrant.  Lungs are hypoinflated without focal consolidation or effusion. Cardiomediastinal  silhouette is within normal. Remainder of the exam is unchanged.  IMPRESSION: Hypoinflation without acute cardiopulmonary disease.  Tubes and lines as described. Note that the endotracheal tube has tip over the origin of the left mainstem bronchus.  These results were called by telephone at the time of interpretation on 03/25/2015 at 4:03 pm to Dr. Thomasene Lot, who verbally acknowledged these results.   Electronically Signed   By: Marin Olp M.D.   On: 03/25/2015 16:03   Personally viewed.   Telemetry: Prior NSVT on tele. None currently. NSR Personally viewed.   EKG:  NSR, TWI inf/ant subtle. Personally viewed  Cardiac Studies:  Cath P  Scheduled Meds: . albuterol  2.5 mg Nebulization Q4H  . antiseptic oral rinse  7 mL Mouth Rinse QID  . chlorhexidine  15 mL Mouth Rinse BID  . famotidine (PEPCID) IV  20 mg Intravenous Q12H  . levETIRAcetam  500 mg Intravenous Q12H  . metoprolol  5 mg Intravenous 4 times per day  . potassium chloride  10 mEq Intravenous Q1H   Continuous Infusions: . dextrose 5% lactated ringers 50 mL/hr at 03/27/15 1003  . heparin 900 Units/hr (03/27/15 0429)  . norepinephrine (LEVOPHED) Adult infusion    . propofol (DIPRIVAN)  infusion 10 mcg/kg/min (03/27/15 0930)   PRN Meds:.sodium chloride, acetaminophen, bisacodyl, fentaNYL (SUBLIMAZE) injection, fentaNYL (SUBLIMAZE) injection, sennosides  Assessment/Plan:  Principal Problem:   Cardiac arrest Active Problems:   Status epilepticus  49 year old with presumed VT/VF arrest (DC CV x 2 by AED), no evidence of current seizure activity.  1. Cardiac arrest  - plan for cath today  - Intubated only for airway protection since admission, no respiratory abnormality (discussed with CCM)  - Trop peak 6.3  - No prolonged QT  - no prior cardiac hisory  - Reviewed Dr. Meda Coffee consult note and agree with cardiac cath. Today should be reasonable and can proceed while intubated.   - Weaned off propofol and she was able to nod  her head yes to proceeding with cardiac cath. Family members Hassan Rowan and Alvester Chou - Daughter and Son, were notified of potential for cath yesterday and understand.  - ASA per NGT.  - Heparin IV - No Bb with recent need for pressor - Will add atorvastatin 80mg  NGT - ECHO pending  2. Seizure  - Keppra  - No further activity  - able to follow commands and answer questions.  3. Essential HTN  - controlled  4. HL  - adding atorvastatin 80  5. Hypokalemia  - replete per CCM  Addalynn Kumari 03/27/2015, 10:00 AM

## 2015-03-27 NOTE — Progress Notes (Signed)
PULMONARY / CRITICAL CARE MEDICINE   Name: Claire Mcdonald MRN: [id number] DOB: 1965/11/23    ADMISSION DATE:  03/25/2015  REFERRING MD :  [facility]  CHIEF COMPLAINT:  V-Fib/V-tach cardiac arrest and status epilepticus  INITIAL PRESENTATION: Noted by coworkers at [facility] to be having seizure-like activity. Received shock by AED with loss of consciouness.  STUDIES:  7/31 Head CT - no acute findings 7/31 Neck CT - no acute findings  SIGNIFICANT EVENTS: 7/31 - Appeared to be having seizure like activity on admission.  Neurology consulted.  Movements stopped on their own.   SUBJECTIVE:  No acute events overnight. Patient still with purposeful movements. Cardiology plans for [facility] today.  ROS:  Unobtainable as patient is intubated & sedated.  VITAL SIGNS: Temp:  [95.7 F (35.4 C)-99 F (37.2 C)] 98.1 F (36.7 C) (08/02 1000) Pulse Rate:  [63-101] 90 (08/02 1000) Resp:  [11-23] 14 (08/02 1000) BP: (53-176)/(24-105) 98/56 mmHg (08/02 1000) SpO2:  [94 %-100 %] 100 % (08/02 1000) FiO2 (%):  [30 %] 30 % (08/02 0953) Weight:  [219 lb 2.2 oz (99.4 kg)] 219 lb 2.2 oz (99.4 kg) (08/02 1120) HEMODYNAMICS:   VENTILATOR SETTINGS: Vent Mode:  [-] PRVC FiO2 (%):  [30 %] 30 % Set Rate:  [14 bmp] 14 bmp Vt Set:  [420 mL] 420 mL PEEP:  [5 cmH20] 5 cmH20 Pressure Support:  [5 cmH20] 5 cmH20 Plateau Pressure:  [9 cmH20-17 cmH20] 17 cmH20 INTAKE / OUTPUT:  Intake/Output Summary (Last 24 hours) at 03/27/15 1213 Last data filed at 03/27/15 0800  Gross per 24 hour  Intake 1763.7 ml  Output   1175 ml  Net  588.7 ml    PHYSICAL EXAMINATION: General:  Awake & moving. Appears . Currently on ventilator. Integument:  Warm & dry. No rash on exposed skin. No bruising. HEENT:  Moist mucus membranes. No scleral injection or icterus. Endotracheal tube in place. PERRL. Cardiovascular:  Regular rate. No edema. No appreciable JVD.  Pulmonary:  Good aeration & clear to auscultation bilaterally.  Symmetric chest wall expansion. No accessory muscle use on ventilator. Abdomen: Soft. Normal bowel sounds. Nondistended.  Neurological:  Wiggling toes on command. Moving hands on command. Patient will open eyes and attend to voice. Does not give a thumbs up on command.  LABS:  CBC  Recent Labs Lab 03/25/15 1917 03/26/15 0500 03/27/15 0435  WBC 16.9* 15.9* 16.7*  HGB 16.2* 14.0 12.7  HCT 48.7* 42.1 38.3  PLT 310 260 265   Coag's  Recent Labs Lab 03/25/15 1917 03/25/15 2140 03/26/15 1500  APTT  --  25 86*  INR 1.02  --  1.13   BMET  Recent Labs Lab 03/26/15 0500 03/26/15 1501 03/27/15 0435  NA 139 140 139  K 3.8 3.5 2.9*  CL 113* 110 108  CO2 22 23 22   BUN 11 10 7   CREATININE 0.61 0.62 0.74  GLUCOSE 99 102* 127*   Electrolytes  Recent Labs Lab 03/25/15 1917  03/26/15 0500 03/26/15 1501 03/27/15 0435  CALCIUM 8.9  < > 8.1* 8.2* 7.9*  MG 2.5*  --  2.2 2.2 2.0  PHOS 3.8  --  3.7  --  3.6  < > = values in this interval not displayed. Sepsis Markers  Recent Labs Lab 03/25/15 1917 03/25/15 2140 03/26/15 0145  LATICACIDVEN 3.1*  --  0.8  PROCALCITON  --  <0.10  --    ABG  Recent Labs Lab 03/25/15 1612 03/25/15 2155 03/26/15 0428  PHART 7.31* 7.356 7.346*  PCO2ART 40 35.7 34.8*  PO2ART 82* 194* 122.0*   Liver Enzymes  Recent Labs Lab 03/25/15 1435 03/25/15 2140 03/27/15 0435  AST 112* 113* 72*  ALT 86* 71* 61*  ALKPHOS 55 43 51  BILITOT 0.8 0.8 0.9  ALBUMIN 3.8 2.9* 2.8*  2.8*   Cardiac Enzymes  Recent Labs Lab 03/26/15 1520 03/26/15 1935 03/27/15 0155  TROPONINI 6.14* 4.67* 4.31*   Glucose  Recent Labs Lab 03/26/15 1245 03/26/15 1611 03/26/15 1940 03/26/15 2354 03/27/15 0438 03/27/15 0727  GLUCAP 109* 80 116* 116* 136* 87    Imaging No results found.   ASSESSMENT / PLAN:  PULMONARY OETT 7.5, 19cm at the lip, placed in ED 7/31 >> A:  Intubated for airway protection and apnea post arrest  P:   Vent  bundle Daily SBT  CARDIOVASCULAR CVL Left subclavian placed 7/31 >> A:  S/P v-fib vs. V-tach arrest  P:  Cardiology following 36C cooling protocol Continue monitoring Continuing heparin drip per protocol  RENAL A:   Hypokalemia - Replacing IV  P:   Trending urine output with Foley catheter Daily BUN/creatinine Repeat electrolytes at 1500 hrs.  GASTROINTESTINAL A:   No acute issues  P:   OG tube in place NPO Bowel regimen  HEMATOLOGIC A:   No acute issues  P:  Monitor CBC daily while on heparin drip Heparin drip per protocol  INFECTIOUS A:   No acute issues  P:   Monitor for signs and sx of infection  ENDOCRINE A:   No current issues    P:   Accu-Cheks every 4 hours  NEUROLOGIC A:   Seizure - status epilepticus  P:   Neurology following  RASS goal: 0 to -1 Continuing propofol drip Continuing Keppra EEG complete   FAMILY  - Updates: No family present today.  - Inter-disciplinary family meet or Palliative Care meeting due by:  8/7   TODAY'S SUMMARY: Plan to continue endotracheal intubation for sedation of LHC. Plan for SBT and probable extubation in AM.  I have spent a total of 34 minutes of critical care time today caring for this patient, discussing plan of care with cardiology, and reviewing the patient's electronic medical record.   Sonia Baller Ashok Cordia, M.D. [facility] Pager:  [phone] After 3pm or if no response, call [phone]   03/27/2015, 12:13 PM

## 2015-03-27 NOTE — Progress Notes (Signed)
    Spoke to son and daughter about cardiac cath. Risks and benefits (stroke, MI, death, bleeding, renal, arrhythmia). Will proceed.  Remains intubated for stable conditions during cath.   Will need EP consult for ICD.  Candee Furbish, MD

## 2015-03-27 NOTE — Progress Notes (Signed)
ANTICOAGULATION CONSULT NOTE - Follow Up Consult  Pharmacy Consult for Heparin Indication: Elevated troponins s/p cardiac arrest  Allergies  Allergen Reactions  . Toradol [Ketorolac Tromethamine] Itching and Rash  . Tramadol Itching and Rash  . Zofran [Ondansetron Hcl] Itching and Rash    Patient Measurements: Height: 5\' 3"  (160 cm) Weight: 217 lb 6 oz (98.6 kg) IBW/kg (Calculated) : 52.4 Heparin Dosing Weight: 75 kg  Vital Signs: Temp: 99 F (37.2 C) (08/02 0000) Temp Source: Core (Comment) (08/02 0000) BP: 134/103 mmHg (08/02 0000) Pulse Rate: 86 (08/02 0000)  Labs:  Recent Labs  03/25/15 1435 03/25/15 1917 03/25/15 2140  03/26/15 0500 03/26/15 1042 03/26/15 1459 03/26/15 1500 03/26/15 1501 03/26/15 1520 03/26/15 1935 03/27/15  HGB 14.4 16.2*  --   --  14.0  --   --   --   --   --   --   --   HCT 44.3 48.7*  --   --  42.1  --   --   --   --   --   --   --   PLT 283 310  --   --  260  --   --   --   --   --   --   --   APTT  --   --  25  --   --   --   --  86*  --   --   --   --   LABPROT  --  13.6  --   --   --   --   --  14.7  --   --   --   --   INR  --  1.02  --   --   --   --   --  1.13  --   --   --   --   HEPARINUNFRC  --   --   --   --   --  0.44 0.69  --   --   --   --  0.64  CREATININE 0.99 0.84 0.70  --  0.61  --   --   --  0.62  --   --   --   TROPONINI 0.57*  --  2.76*  < > 6.34*  --   --   --   --  6.14* 4.67*  --   < > = values in this interval not displayed.  Estimated Creatinine Clearance: 97.3 mL/min (by C-G formula based on Cr of 0.62).  Assessment: 49yo female continues on heparin gtt s/p cardiac arrest with elevated troponin. Pt currently on 36-degree cooling. Heparin level 0.64 (therapeutic) on 900 units/hr. No bleeding noted.  Goal of Therapy:  Heparin level 0.3-0.7 units/ml Monitor platelets by anticoagulation protocol: Yes   Plan:  Continue heparin 900 units/hr Daily HL and CBC  Sherlon Handing, PharmD, BCPS Clinical pharmacist,  pager [phone]  03/27/2015,12:27 AM

## 2015-03-27 NOTE — Progress Notes (Addendum)
Subjective: No further clinical seizure activity noted  Troponin trending down, currently 4.31  Objective: Current vital signs: BP 127/90 mmHg  Pulse 79  Temp(Src) 98.8 F (37.1 C) (Core (Comment))  Resp 17  Ht 5\' 3"  (1.6 m)  Wt 99.4 kg (219 lb 2.2 oz)  BMI 38.83 kg/m2  SpO2 100%  LMP 03/06/2015 Vital signs in last 24 hours: Temp:  [95.7 F (35.4 C)-99 F (37.2 C)] 98.8 F (37.1 C) (08/02 0700) Pulse Rate:  [57-99] 79 (08/02 0700) Resp:  [11-23] 17 (08/02 0700) BP: (53-176)/(24-105) 127/90 mmHg (08/02 0700) SpO2:  [94 %-100 %] 100 % (08/02 0700) FiO2 (%):  [30 %] 30 % (08/02 0400) Weight:  [99.4 kg (219 lb 2.2 oz)] 99.4 kg (219 lb 2.2 oz) (08/02 0300)  Intake/Output from previous day: 08/01 0701 - 08/02 0700 In: 1751.9 [I.V.:1391.9; IV Piggyback:360] Out: 5993 [Urine:875; Emesis/NG output:400] Intake/Output this shift:   Nutritional status: Diet NPO time specified  Neurologic Exam:  Patient was intubated and on mechanical ventilation. Currently on sedation.  Non-verbal, will open eyes spontaneously, follow simple commands (open/close eyes, look to the right/left) Extraocular movements were intact and conjugate with oculocephalic maneuvers. No gaze deviation. PERRL No facial asymmetry was noted. Will move spontaneously and to command in all extremties Plantar responses were extensor bilaterally. Lab Results: Basic Metabolic Panel:  Recent Labs Lab 03/25/15 1917 03/25/15 2140 03/26/15 0500 03/26/15 1501 03/27/15 0435  NA 141 139 139 140 139  K 5.1 3.1* 3.8 3.5 2.9*  CL 108 110 113* 110 108  CO2 21* 22 22 23 22   GLUCOSE 85 108* 99 102* 127*  BUN 15 16 11 10 7   CREATININE 0.84 0.70 0.61 0.62 0.74  CALCIUM 8.9 7.8* 8.1* 8.2* 7.9*  MG 2.5*  --  2.2 2.2 2.0  PHOS 3.8  --  3.7  --  3.6    Liver Function Tests:  Recent Labs Lab 03/25/15 1435 03/25/15 2140 03/27/15 0435  AST 112* 113* 72*  ALT 86* 71* 61*  ALKPHOS 55 43 51  BILITOT 0.8 0.8 0.9  PROT  6.7 5.3* 5.5*  ALBUMIN 3.8 2.9* 2.8*  2.8*   No results for input(s): LIPASE, AMYLASE in the last 168 hours. No results for input(s): AMMONIA in the last 168 hours.  CBC:  Recent Labs Lab 03/25/15 1435 03/25/15 1917 03/26/15 0500 03/27/15 0435  WBC 8.8 16.9* 15.9* 16.7*  NEUTROABS 5.7  --   --   --   HGB 14.4 16.2* 14.0 12.7  HCT 44.3 48.7* 42.1 38.3  MCV 92.5 92.9 93.1 91.4  PLT 283 310 260 265    Cardiac Enzymes:  Recent Labs Lab 03/26/15 0245 03/26/15 0500 03/26/15 1520 03/26/15 1935 03/27/15 0155  TROPONINI 5.13* 6.34* 6.14* 4.67* 4.31*    Lipid Panel:  Recent Labs Lab 03/25/15 2140  TRIG 133    CBG:  Recent Labs Lab 03/26/15 1611 03/26/15 1940 03/26/15 2354 03/27/15 0438 03/27/15 0727  GLUCAP 80 116* 116* 136* 66    Microbiology: Results for orders placed or performed during the hospital encounter of 03/25/15  MRSA PCR Screening     Status: None   Collection Time: 03/25/15  8:44 PM  Result Value Ref Range Status   MRSA by PCR NEGATIVE NEGATIVE Final    Comment:        The GeneXpert MRSA Assay (FDA approved for NASAL specimens only), is one component of a comprehensive MRSA colonization surveillance program. It is not intended to diagnose MRSA infection  nor to guide or monitor treatment for MRSA infections.     Coagulation Studies:  Recent Labs  03/25/15 1917 03/26/15 1500  LABPROT 13.6 14.7  INR 1.02 1.13    Imaging: Dg Abd 1 View  03/25/2015   CLINICAL DATA:  OG tube placement, cardiac arrest  EXAM: ABDOMEN - 1 VIEW  COMPARISON:  CT abdomen pelvis dated 12/30/2014  FINDINGS: Enteric tube is looped in the stomach and terminates in the gastric cardia.  Nonobstructive bowel gas pattern.  Defibrillator pads overlie the lower chest/upper abdomen.  IMPRESSION: Enteric tube is looped in the stomach and terminates in the gastric cardia.   Electronically Signed   By: Julian Hy M.D.   On: 03/25/2015 16:10   Ct Head Wo  Contrast  03/25/2015   CLINICAL DATA:  Found on floor at work.  Unresponsive.  EXAM: CT HEAD WITHOUT CONTRAST  CT CERVICAL SPINE WITHOUT CONTRAST  TECHNIQUE: Multidetector CT imaging of the head and cervical spine was performed following the standard protocol without intravenous contrast. Multiplanar CT image reconstructions of the cervical spine were also generated.  COMPARISON:  10/27/2012  FINDINGS: CT HEAD FINDINGS  No acute intracranial abnormality. Specifically, no hemorrhage, hydrocephalus, mass lesion, acute infarction, or significant intracranial injury. No acute calvarial abnormality. Visualized paranasal sinuses and mastoids clear. Orbital soft tissues unremarkable.  CT CERVICAL SPINE FINDINGS  Motion artifact degrades image quality. No fracture visualized. No malalignment. Prevertebral soft tissues are normal. No epidural or paraspinal hematoma.  IMPRESSION: No intracranial abnormality.  No acute bony abnormality in the cervical spine.   Electronically Signed   By: Rolm Baptise M.D.   On: 03/25/2015 15:06   Ct Cervical Spine Wo Contrast  03/25/2015   CLINICAL DATA:  Found on floor at work.  Unresponsive.  EXAM: CT HEAD WITHOUT CONTRAST  CT CERVICAL SPINE WITHOUT CONTRAST  TECHNIQUE: Multidetector CT imaging of the head and cervical spine was performed following the standard protocol without intravenous contrast. Multiplanar CT image reconstructions of the cervical spine were also generated.  COMPARISON:  10/27/2012  FINDINGS: CT HEAD FINDINGS  No acute intracranial abnormality. Specifically, no hemorrhage, hydrocephalus, mass lesion, acute infarction, or significant intracranial injury. No acute calvarial abnormality. Visualized paranasal sinuses and mastoids clear. Orbital soft tissues unremarkable.  CT CERVICAL SPINE FINDINGS  Motion artifact degrades image quality. No fracture visualized. No malalignment. Prevertebral soft tissues are normal. No epidural or paraspinal hematoma.  IMPRESSION: No  intracranial abnormality.  No acute bony abnormality in the cervical spine.   Electronically Signed   By: Rolm Baptise M.D.   On: 03/25/2015 15:06   Dg Chest Port 1 View  03/25/2015   CLINICAL DATA:  Central line placement.  Hypothermia.  EXAM: PORTABLE CHEST - 1 VIEW  COMPARISON:  03/25/2015.  FINDINGS: Support apparatus: Endotracheal tube tip 1 cm from the carina. Enteric tube is present doubled back upon itself with the tip at the cardia of the stomach.  New LEFT subclavian central line is present. The tip is in the lower superior vena cava at the cavoatrial junction.  Cardiomediastinal Silhouette:  Unchanged.  Lungs: Opacity at the LEFT costophrenic angle, likely representing atelectasis. Airspace disease could also produce this appearance. No pneumothorax.  Effusions:  None.  Other:  None.  IMPRESSION: 1. Support apparatus as described above. The endotracheal tube has been retracted but remains 1 cm from the carina. 2. Uncomplicated interval placement of LEFT subclavian central line with the tip at the junction of the superior vena cava and  RIGHT atrium. 3. Opacification at the LEFT costophrenic angle which may represent atelectasis or airspace disease.   Electronically Signed   By: Dereck Ligas M.D.   On: 03/25/2015 21:01   Dg Chest Port 1 View  03/25/2015   CLINICAL DATA:  Intubation, cardiac arrest.  EXAM: PORTABLE CHEST - 1 VIEW  COMPARISON:  01/23/2015  FINDINGS: Endotracheal tube has tip in the origin of the left mainstem bronchus. This could be pulled back approximately 3 cm. Nasogastric tube is coiled once over the stomach with tip and side-port over the stomach in the left upper quadrant.  Lungs are hypoinflated without focal consolidation or effusion. Cardiomediastinal silhouette is within normal. Remainder of the exam is unchanged.  IMPRESSION: Hypoinflation without acute cardiopulmonary disease.  Tubes and lines as described. Note that the endotracheal tube has tip over the origin of the  left mainstem bronchus.  These results were called by telephone at the time of interpretation on 03/25/2015 at 4:03 pm to Dr. Thomasene Lot, who verbally acknowledged these results.   Electronically Signed   By: Marin Olp M.D.   On: 03/25/2015 16:03    Medications:  Scheduled: . albuterol  2.5 mg Nebulization Q4H  . antiseptic oral rinse  7 mL Mouth Rinse QID  . chlorhexidine  15 mL Mouth Rinse BID  . famotidine (PEPCID) IV  20 mg Intravenous Q12H  . levETIRAcetam  500 mg Intravenous Q12H  . metoprolol  5 mg Intravenous 4 times per day  . potassium chloride  10 mEq Intravenous Q1H    Assessment/Plan:  49 year old lady with hypertension and hyperlipidemia presenting with cardiac arrest as well as possible associated seizure activity. Continued status epilepticus is unlikely, particularly with patient's responsiveness to external stimuli upon initial evaluation. Patient able to follow simple commands off sedation, no focal deficits noted.  Recommendations: 1. Continue Keppra IV 500 mg every 12 hours, for now. 2. EEG, routine  3. Cardiac workup and treatment per primary team  Addendum: EEG completed, shows no ongoing seizure activity. Able to follow simple commands on limited sedation, no focal deficits noted on exam. No further neurological workup indicated at this time. Would continue keppra upon discharge, will need outpatient neurology follow up in 2-4 weeks.    LOS: 2 days   Jim Like, DO [facility] [phone]  If 7pm- 7am, please page neurology on call as listed in AMION. 03/27/2015  7:45 AM

## 2015-03-28 ENCOUNTER — Encounter (HOSPITAL_COMMUNITY): Payer: Self-pay | Admitting: Cardiovascular Disease

## 2015-03-28 LAB — CBC
HEMATOCRIT: 36.2 % (ref 36.0–46.0)
Hemoglobin: 11.8 g/dL — ABNORMAL LOW (ref 12.0–15.0)
MCH: 30.1 pg (ref 26.0–34.0)
MCHC: 32.6 g/dL (ref 30.0–36.0)
MCV: 92.3 fL (ref 78.0–100.0)
Platelets: 257 10*3/uL (ref 150–400)
RBC: 3.92 MIL/uL (ref 3.87–5.11)
RDW: 14.5 % (ref 11.5–15.5)
WBC: 14.1 10*3/uL — AB (ref 4.0–10.5)

## 2015-03-28 LAB — GLUCOSE, CAPILLARY
GLUCOSE-CAPILLARY: 105 mg/dL — AB (ref 65–99)
GLUCOSE-CAPILLARY: 104 mg/dL — AB (ref 65–99)
GLUCOSE-CAPILLARY: 88 mg/dL (ref 65–99)
Glucose-Capillary: 66 mg/dL (ref 65–99)
Glucose-Capillary: 81 mg/dL (ref 65–99)
Glucose-Capillary: 90 mg/dL (ref 65–99)
Glucose-Capillary: 101 mg/dL — ABNORMAL HIGH (ref 65–99)

## 2015-03-28 LAB — RENAL FUNCTION PANEL
ANION GAP: 8 (ref 5–15)
Albumin: 2.6 g/dL — ABNORMAL LOW (ref 3.5–5.0)
BUN: <5 mg/dL — ABNORMAL LOW (ref 6–20)
CHLORIDE: 110 mmol/L (ref 101–111)
CO2: 23 mmol/L (ref 22–32)
Calcium: 8.1 mg/dL — ABNORMAL LOW (ref 8.9–10.3)
Creatinine, Ser: 0.6 mg/dL (ref 0.44–1.00)
GFR calc Af Amer: >60 mL/min (ref >60)
GFR calc non Af Amer: >60 mL/min (ref >60)
Glucose, Bld: 102 mg/dL — ABNORMAL HIGH (ref 65–99)
Phosphorus: 3.2 mg/dL (ref 2.5–4.6)
Potassium: 3.8 mmol/L (ref 3.5–5.1)
SODIUM: 141 mmol/L (ref 135–145)

## 2015-03-28 LAB — TRIGLYCERIDES: TRIGLYCERIDES: 93 mg/dL (ref <150)

## 2015-03-28 LAB — MAGNESIUM: MAGNESIUM: 2.1 mg/dL (ref 1.7–2.4)

## 2015-03-28 MED ORDER — SODIUM CHLORIDE 0.9 % IV BOLUS (SEPSIS)
500.0000 mL | Freq: Once | INTRAVENOUS | Status: AC
  Administered 2015-03-28: 500 mL via INTRAVENOUS

## 2015-03-28 MED ORDER — DEXAMETHASONE SODIUM PHOSPHATE 4 MG/ML IJ SOLN
2.0000 mg | Freq: Four times a day (QID) | INTRAMUSCULAR | Status: AC
  Administered 2015-03-28 (×2): 2 mg via INTRAVENOUS
  Filled 2015-03-28 (×2): qty 1

## 2015-03-28 MED ORDER — DEXTROSE 50 % IV SOLN
INTRAVENOUS | Status: AC
  Filled 2015-03-28: qty 50

## 2015-03-28 MED ORDER — SODIUM CHLORIDE 0.9 % IV BOLUS (SEPSIS)
250.0000 mL | Freq: Once | INTRAVENOUS | Status: AC
  Administered 2015-03-28: 250 mL via INTRAVENOUS

## 2015-03-28 MED ORDER — DEXTROSE 50 % IV SOLN
1.0000 | Freq: Once | INTRAVENOUS | Status: AC
  Administered 2015-03-28: 50 mL via INTRAVENOUS

## 2015-03-28 MED FILL — Lidocaine HCl Local Preservative Free (PF) Inj 1%: INTRAMUSCULAR | Qty: 30 | Status: AC

## 2015-03-28 NOTE — Progress Notes (Signed)
Unable to extubate at this time sedation restarted, patient restless pulling at ET tube.

## 2015-03-28 NOTE — Progress Notes (Signed)
Subjective:  Able to follow commands, alert off sedation. Intubated. CCM giving decadron prior to attempt at extubation tomorrow.   Objective:  Vital Signs in the last 24 hours: Temp:  [98.2 F (36.8 C)-99.3 F (37.4 C)] 98.4 F (36.9 C) (08/03 1015) Pulse Rate:  [82-198] 112 (08/03 0900) Resp:  [10-25] 20 (08/03 1015) BP: (85-175)/(31-120) 98/57 mmHg (08/03 1015) SpO2:  [0 %-100 %] 99 % (08/03 0900) FiO2 (%):  [30 %] 30 % (08/03 0802) Weight:  [219 lb 2.2 oz (99.4 kg)-221 lb 9 oz (100.5 kg)] 221 lb 9 oz (100.5 kg) (08/03 0350)  Intake/Output from previous day: 08/02 0701 - 08/03 0700 In: 2360.1 [I.V.:2050.1; IV Piggyback:310] Out: 8676 [Urine:1755]   Physical Exam: General: Well developed, well nourished, on vent sedate now. Head:  Normocephalic and atraumatic. Intubated Lungs: Clear to auscultation and percussion. Heart: Normal S1 and S2.  No murmur, rubs or gallops.  Abdomen: soft, non-tender, positive bowel sounds. Obese Extremities: No clubbing or cyanosis. No edema.  Neurologic: Able to answer questions off propofol, no seizure.    Lab Results:  Recent Labs  03/27/15 1615 03/28/15 0415  WBC 15.7* 14.1*  HGB 12.2 11.8*  PLT 268 257    Recent Labs  03/27/15 2022 03/28/15 0415  NA 141 141  K 3.7 3.8  CL 112* 110  CO2 22 23  GLUCOSE 101* 102*  BUN <5* <5*  CREATININE 0.75 0.60    Recent Labs  03/26/15 1935 03/27/15 0155  TROPONINI 4.67* 4.31*   Hepatic Function Panel  Recent Labs  03/27/15 0435 03/28/15 0415  PROT 5.5*  --   ALBUMIN 2.8*  2.8* 2.6*  AST 72*  --   ALT 61*  --   ALKPHOS 51  --   BILITOT 0.9  --   BILIDIR 0.2  --   IBILI 0.7  --     Telemetry: Prior NSVT on tele. None currently. NSR Personally viewed.   EKG:  NSR, TWI inf/ant. Normal QT interval. Personally viewed  Cardiac Studies:  Cath P  Scheduled Meds: . albuterol  2.5 mg Nebulization Q4H  . antiseptic oral rinse  7 mL Mouth Rinse QID  . aspirin  81 mg  Oral Daily  . atorvastatin  80 mg Oral q1800  . chlorhexidine  15 mL Mouth Rinse BID  . dexamethasone  2 mg Intravenous 4 times per day  . famotidine (PEPCID) IV  20 mg Intravenous Q12H  . heparin  5,000 Units Subcutaneous 3 times per day  . levETIRAcetam  500 mg Intravenous Q12H  . metoprolol  5 mg Intravenous 4 times per day  . sodium chloride  3 mL Intravenous Q12H   Continuous Infusions: . sodium chloride Stopped (03/27/15 2353)  . dextrose 5% lactated ringers 50 mL/hr at 03/28/15 0754  . norepinephrine (LEVOPHED) Adult infusion    . propofol (DIPRIVAN) infusion Stopped (03/28/15 0900)   PRN Meds:.sodium chloride, sodium chloride, acetaminophen, bisacodyl, fentaNYL (SUBLIMAZE) injection, fentaNYL (SUBLIMAZE) injection, ondansetron (ZOFRAN) IV, promethazine, sennosides, sodium chloride  Assessment/Plan:  Principal Problem:   Cardiac arrest Active Problems:   Status epilepticus   Seizure   Essential hypertension   Hyperlipemia  49 year old with presumed VT/VF arrest (DC CV x 2 by AED), no evidence of current seizure activity.  1. Cardiac arrest  -  Cath with 10%, 30% lesion. No significant CAD. EF normalized.   - Intubated only for airway protection since admission, no respiratory abnormality (discussed with CCM). Getting decadron and  hopeful extubation tomorrow.   - Trop peak 6.3  - No prolonged QT but TWI noted on ECG  - no prior cardiac hisory  - Family members Hassan Rowan and Alvester Chou - Daughter and Son) - ASA per NGT.  - Heparin IV - Low dose Bb 5mg  IV Q 6 with recent need for pressor. Will change to PO when able.  - Will add atorvastatin 80mg  NGT - ECHO -normal EF, mild LVH - Once extubated will have EP service consulted.   2. Seizure  - Keppra  - No further activity  - able to follow commands and answer questions.  - EEG normal  3. Essential HTN  - controlled  4. HL  - added atorvastatin 80  5. Hypokalemia  - replete per CCM  Deitrick Ferreri 03/28/2015, 10:46  AM

## 2015-03-28 NOTE — Progress Notes (Signed)
Patient meets wean protocol and RN is reducing sedation. Patient will SBT once she is more awake. RT will continue to monitor.

## 2015-03-28 NOTE — Progress Notes (Signed)
PCCM Attending SBT Note: Patient on PS 0/5 & FiO2 0.3 with good hemodynamic stability & comfort. Patient had zero cuff leak w/ 7.5 ETT. Starting Decadron & repeat SBT AM 8/4.  Sonia Baller Ashok Cordia, M.D. [facility] Pager:  [phone] After 3pm or if no response, call [phone]

## 2015-03-28 NOTE — Progress Notes (Signed)
RT came to bedside with RN and MD. Patient was assessed for a cuff leak prior to attempting extubation. No cuff leak was present and patient was not extubated.  Patient was returned to full support. RT will continue to monitor patient.

## 2015-03-28 NOTE — Progress Notes (Signed)
PULMONARY / CRITICAL CARE MEDICINE   Name: Claire Mcdonald MRN: [id number] DOB: 11-16-1965    ADMISSION DATE:  03/25/2015  REFERRING MD :  [facility]  CHIEF COMPLAINT:  V-Fib/V-tach cardiac arrest and status epilepticus  INITIAL PRESENTATION: Noted by coworkers at [facility] to be having seizure-like activity. Received shock by AED with loss of consciouness.  STUDIES:  7/31 Head CT - no acute findings 7/31 Neck CT - no acute findings  SIGNIFICANT EVENTS: 7/31 - Appeared to be having seizure like activity on admission.  Neurology consulted.  Movements stopped on their own.  8/02 - LHC: Reportedly no obstructing CAD  SUBJECTIVE:  S/P LHC without CAD obstructing lesions. Patient awake this morning. No acute events overnight. Heparin drip discontinued yesterday.  ROS:  Unobtainable as patient is intubated & sedated.  VITAL SIGNS: Temp:  [98.1 F (36.7 C)-99.3 F (37.4 C)] 98.6 F (37 C) (08/03 0800) Pulse Rate:  [82-198] 100 (08/03 0802) Resp:  [10-25] 25 (08/03 0802) BP: (85-175)/(31-120) 148/111 mmHg (08/03 0800) SpO2:  [0 %-100 %] 100 % (08/03 0802) FiO2 (%):  [30 %] 30 % (08/03 0802) Weight:  [219 lb 2.2 oz (99.4 kg)-221 lb 9 oz (100.5 kg)] 221 lb 9 oz (100.5 kg) (08/03 0350) HEMODYNAMICS:   VENTILATOR SETTINGS: Vent Mode:  [-] PRVC FiO2 (%):  [30 %] 30 % Set Rate:  [14 bmp] 14 bmp Vt Set:  [420 mL] 420 mL PEEP:  [5 cmH20] 5 cmH20 Plateau Pressure:  [10 cmH20-17 cmH20] 11 cmH20 INTAKE / OUTPUT:  Intake/Output Summary (Last 24 hours) at 03/28/15 0916 Last data filed at 03/28/15 0600  Gross per 24 hour  Intake 2243.14 ml  Output   1755 ml  Net 488.14 ml    PHYSICAL EXAMINATION: General:  Awake. No distress but uncomfortable with ETT. Currently on ventilator. Integument:  Warm & dry. No rash on exposed skin. Bruising right wrist. HEENT:  Moist mucus membranes. Endotracheal tube in place. PERRL. Cardiovascular:  Tachycardic but regular rhythm. No edema. No  appreciable JVD.  Pulmonary:  Good aeration & clear to auscultation bilaterally. Symmetric chest wall rise on ventilator. Abdomen: Soft. Normal bowel sounds. Nondistended.  Neurological:  Wiggling toes on command. Moving hands on command. Attends to voice.  LABS:  CBC  Recent Labs Lab 03/27/15 0435 03/27/15 1615 03/28/15 0415  WBC 16.7* 15.7* 14.1*  HGB 12.7 12.2 11.8*  HCT 38.3 36.9 36.2  PLT 265 268 257   Coag's  Recent Labs Lab 03/25/15 1917 03/25/15 2140 03/26/15 1500  APTT  --  25 86*  INR 1.02  --  1.13   BMET  Recent Labs Lab 03/27/15 1615 03/27/15 2022 03/28/15 0415  NA 141 141 141  K 4.0 3.7 3.8  CL 110 112* 110  CO2 23 22 23   BUN 6 <5* <5*  CREATININE 0.70 0.75 0.60  GLUCOSE 110* 101* 102*   Electrolytes  Recent Labs Lab 03/26/15 0500 03/26/15 1501 03/27/15 0435 03/27/15 1615 03/27/15 2022 03/28/15 0415  CALCIUM 8.1* 8.2* 7.9* 8.1* 8.0* 8.1*  MG 2.2 2.2 2.0  --   --  2.1  PHOS 3.7  --  3.6  --   --  3.2   Sepsis Markers  Recent Labs Lab 03/25/15 1917 03/25/15 2140 03/26/15 0145  LATICACIDVEN 3.1*  --  0.8  PROCALCITON  --  <0.10  --    ABG  Recent Labs Lab 03/25/15 1612 03/25/15 2155 03/26/15 0428  PHART 7.31* 7.356 7.346*  PCO2ART 40 35.7  34.8*  PO2ART 82* 194* 122.0*   Liver Enzymes  Recent Labs Lab 03/25/15 1435 03/25/15 2140 03/27/15 0435 03/28/15 0415  AST 112* 113* 72*  --   ALT 86* 71* 61*  --   ALKPHOS 55 43 51  --   BILITOT 0.8 0.8 0.9  --   ALBUMIN 3.8 2.9* 2.8*  2.8* 2.6*   Cardiac Enzymes  Recent Labs Lab 03/26/15 1520 03/26/15 1935 03/27/15 0155  TROPONINI 6.14* 4.67* 4.31*   Glucose  Recent Labs Lab 03/27/15 1539 03/27/15 2028 03/28/15 0043 03/28/15 0417 03/28/15 0756 03/28/15 0909  GLUCAP 82 101* 81 105* 66 104*    Imaging No results found.   ASSESSMENT / PLAN:  PULMONARY OETT 7.5, 19cm at the lip, placed in ED 7/31 >> A:  Intubated for airway protection and apnea post  arrest  P:   Vent bundle SBT today Hopeful for extubation  CARDIOVASCULAR CVL Left subclavian placed 7/31 >> A:  S/P v-fib vs. V-tach arrest  P:  Cardiology following S/P cooling Continue monitoring Off heparin drip ASA 81mg  Lipitor 80mg  qhs Lopressor IV qid  RENAL A:   Hypokalemia - Resolved  P:   Trending urine output with Foley catheter Daily BUN/creatinine Repeat electrolytes AM daily  GASTROINTESTINAL A:   No acute issues  P:   OG tube in place NPO Bowel regimen Pepcid IV bid  HEMATOLOGIC A:   Leukocytosis - Improving Mild anemia - No signs of active bleeding  P:  Monitor Hgb w/ CBC daily  Trend Leukocytosis w/ daily CBC Heparin drip discontinued SCDs Heparin [place] q8hr  INFECTIOUS A:   No acute issues  P:   Monitor for signs and sx of infection Daily   ENDOCRINE A:   Hypoglycemia    P:   Accu-Cheks every 4 hours D5LR @ 50cc/hr  NEUROLOGIC A:   Seizure - status epilepticus  P:   Neurology following  RASS goal: 0 to -1 Continuing propofol drip Continuing Keppra EEG w/o seizure activity   FAMILY  - Updates: No family present today.  - Inter-disciplinary family meet or Palliative Care meeting due by:  8/7   TODAY'S SUMMARY: Electrolytes stable. Mental status remains nonfocal. Attempting SBT and probable extubation this morning.   I have spent a total of 35 minutes of critical care time today caring for this patient, discussing plan of care with RN & RT, and reviewing the patient's electronic medical record.   Sonia Baller Ashok Cordia, M.D. [facility] Pager:  [phone] After 3pm or if no response, call [phone] 03/28/2015, 9:16 AM

## 2015-03-29 ENCOUNTER — Encounter (HOSPITAL_COMMUNITY): Payer: Self-pay

## 2015-03-29 LAB — GLUCOSE, CAPILLARY
GLUCOSE-CAPILLARY: 118 mg/dL — AB (ref 65–99)
GLUCOSE-CAPILLARY: 119 mg/dL — AB (ref 65–99)
Glucose-Capillary: 97 mg/dL (ref 65–99)
Glucose-Capillary: 119 mg/dL — ABNORMAL HIGH (ref 65–99)
Glucose-Capillary: 98 mg/dL (ref 65–99)
Glucose-Capillary: 140 mg/dL — ABNORMAL HIGH (ref 65–99)

## 2015-03-29 LAB — RENAL FUNCTION PANEL
ANION GAP: 11 (ref 5–15)
Albumin: 2.5 g/dL — ABNORMAL LOW (ref 3.5–5.0)
CALCIUM: 8.4 mg/dL — AB (ref 8.9–10.3)
CO2: 20 mmol/L — ABNORMAL LOW (ref 22–32)
Chloride: 110 mmol/L (ref 101–111)
Creatinine, Ser: 0.68 mg/dL (ref 0.44–1.00)
GFR calc Af Amer: >60 mL/min (ref >60)
GFR calc non Af Amer: >60 mL/min (ref >60)
Glucose, Bld: 116 mg/dL — ABNORMAL HIGH (ref 65–99)
POTASSIUM: 3.5 mmol/L (ref 3.5–5.1)
Phosphorus: 3.4 mg/dL (ref 2.5–4.6)
Sodium: 141 mmol/L (ref 135–145)

## 2015-03-29 LAB — MAGNESIUM: Magnesium: 2.2 mg/dL (ref 1.7–2.4)

## 2015-03-29 LAB — CBC
HCT: 35.4 % — ABNORMAL LOW (ref 36.0–46.0)
HEMOGLOBIN: 11.5 g/dL — AB (ref 12.0–15.0)
MCH: 30.3 pg (ref 26.0–34.0)
MCHC: 32.5 g/dL (ref 30.0–36.0)
MCV: 93.2 fL (ref 78.0–100.0)
Platelets: 304 10*3/uL (ref 150–400)
RBC: 3.8 MIL/uL — ABNORMAL LOW (ref 3.87–5.11)
RDW: 14.6 % (ref 11.5–15.5)
WBC: 14.4 10*3/uL — AB (ref 4.0–10.5)

## 2015-03-29 MED ORDER — ACETAMINOPHEN 160 MG/5ML PO SOLN
650.0000 mg | ORAL | Status: DC | PRN
  Administered 2015-03-30 – 2015-04-10 (×4): 650 mg via ORAL
  Filled 2015-03-29 (×4): qty 20.3

## 2015-03-29 MED ORDER — POTASSIUM CHLORIDE 10 MEQ/50ML IV SOLN
10.0000 meq | INTRAVENOUS | Status: AC
  Administered 2015-03-29 (×2): 10 meq via INTRAVENOUS
  Filled 2015-03-29 (×2): qty 50

## 2015-03-29 NOTE — Progress Notes (Deleted)
Pt went into afib RVR with rates in the 140's Elink MD called and orders were given. Hr improved with 5mg  of IV Lopressor.

## 2015-03-29 NOTE — Procedures (Signed)
Extubation Procedure Note  Patient Details:   Name: Claire Mcdonald DOB: August 05, 1966 MRN: [id number]   Airway Documentation:     Evaluation  O2 sats: stable throughout Complications: No apparent complications Patient did tolerate procedure well. Bilateral Breath Sounds: Rhonchi, Diminished Suctioning: Airway Yes   Pt was extubated to a 4L [place]. Cuff leak was noted. Pt BS diminished. Pt tolerated procedure well. RN at bedside with RT. RT will continue to monitor.  Tiburcio Bash 03/29/2015, 10:54 AM

## 2015-03-29 NOTE — Progress Notes (Signed)
[facility] ADULT ICU REPLACEMENT PROTOCOL FOR AM LAB REPLACEMENT ONLY  The patient does apply for the [facility] Adult ICU Electrolyte Replacment Protocol based on the criteria listed below:   1. Is GFR >/= 40 ml/min? Yes.    Patient's GFR today is >60 2. Is urine output >/= 0.5 ml/kg/hr for the last 6 hours? Yes.   Patient's UOP is 0.9 ml/kg/hr 3. Is BUN < 60 mg/dL? Yes.    Patient's BUN today is <5 4. Abnormal electrolyte(s): K3.5 5. Ordered repletion with: per protocol 6. If a panic level lab has been reported, has the CCM MD in charge been notified? Yes.  .   Physician:  Augustina Mood, MD  [facility] 03/29/2015 6:36 AM

## 2015-03-29 NOTE — Progress Notes (Signed)
Subjective:  Able to follow commands, alert off sedation. Intubated. CCM giving decadron prior to attempt at extubation tomorrow.   Objective:  Vital Signs in the last 24 hours: Temp:  [97.9 F (36.6 C)-99.7 F (37.6 C)] 98.7 F (37.1 C) (08/04 0722) Pulse Rate:  [88-123] 107 (08/04 0800) Resp:  [0-23] 12 (08/04 0800) BP: (72-132)/(33-110) 116/65 mmHg (08/04 0800) SpO2:  [97 %-100 %] 100 % (08/04 0800) FiO2 (%):  [30 %] 30 % (08/04 0752) Weight:  [222 lb 3.6 oz (100.8 kg)] 222 lb 3.6 oz (100.8 kg) (08/04 0314)  Intake/Output from previous day: 08/03 0701 - 08/04 0700 In: 2560.5 [I.V.:2250.5; IV Piggyback:310] Out: 1700 [Urine:1700]   Physical Exam: General: Well developed, well nourished, on vent sedate now. Head:  Normocephalic and atraumatic. Intubated Lungs: Clear to auscultation and percussion. Heart: Normal S1 and S2.  No murmur, rubs or gallops.  Abdomen: soft, non-tender, positive bowel sounds. Obese Extremities: No clubbing or cyanosis. No edema.  Neurologic: Able to answer questions off propofol, no seizure.    Lab Results:  Recent Labs  03/28/15 0415 03/29/15 0330  WBC 14.1* 14.4*  HGB 11.8* 11.5*  PLT 257 304    Recent Labs  03/28/15 0415 03/29/15 0330  NA 141 141  K 3.8 3.5  CL 110 110  CO2 23 20*  GLUCOSE 102* 116*  BUN <5* <5*  CREATININE 0.60 0.68    Recent Labs  03/26/15 1935 03/27/15 0155  TROPONINI 4.67* 4.31*   Hepatic Function Panel  Recent Labs  03/27/15 0435  03/29/15 0330  PROT 5.5*  --   --   ALBUMIN 2.8*  2.8*  < > 2.5*  AST 72*  --   --   ALT 61*  --   --   ALKPHOS 51  --   --   BILITOT 0.9  --   --   BILIDIR 0.2  --   --   IBILI 0.7  --   --   < > = values in this interval not displayed.  Telemetry: Prior NSVT on tele. None currently. NSR/ sinus tachy Personally viewed.   EKG:  NSR, TWI inf/ant. Normal QT interval. Personally viewed  Cardiac Studies:  Cath no significant CAD (10,30%  lesions).  Scheduled Meds: . albuterol  2.5 mg Nebulization Q4H  . antiseptic oral rinse  7 mL Mouth Rinse QID  . aspirin  81 mg Oral Daily  . atorvastatin  80 mg Oral q1800  . chlorhexidine  15 mL Mouth Rinse BID  . famotidine (PEPCID) IV  20 mg Intravenous Q12H  . heparin  5,000 Units Subcutaneous 3 times per day  . levETIRAcetam  500 mg Intravenous Q12H  . metoprolol  5 mg Intravenous 4 times per day  . potassium chloride  10 mEq Intravenous Q1 Hr x 2  . sodium chloride  3 mL Intravenous Q12H   Continuous Infusions: . sodium chloride Stopped (03/27/15 2353)  . dextrose 5% lactated ringers 50 mL/hr at 03/29/15 0700  . norepinephrine (LEVOPHED) Adult infusion    . propofol (DIPRIVAN) infusion 10 mcg/kg/min (03/29/15 0727)   PRN Meds:.sodium chloride, sodium chloride, acetaminophen, bisacodyl, fentaNYL (SUBLIMAZE) injection, fentaNYL (SUBLIMAZE) injection, ondansetron (ZOFRAN) IV, promethazine, sennosides, sodium chloride  Assessment/Plan:  Principal Problem:   Cardiac arrest Active Problems:   Status epilepticus   Seizure   Essential hypertension   Hyperlipemia  49 year old with presumed VT/VF arrest (DC CV x 2 by AED), no evidence of current seizure activity.  1. Cardiac arrest  -  Cath with 10%, 30% lesion. No significant CAD. EF normalized.   - Intubated only for airway protection since admission, no respiratory abnormality (discussed with CCM). Getting decadron and hopeful extubation today.   - Trop peak 6.3  - No prolonged QT but TWI noted on ECG  - no prior cardiac hisory  - Family members Hassan Rowan and Alvester Chou - Daughter and Son) - ASA per NGT.  - Heparin IV - Low dose Bb 5mg  IV Q 6 with recent need for pressor. Will change to PO when able.  - added atorvastatin 80mg  NGT - ECHO -normal EF, mild LVH - Once extubated will have EP service consulted.   2. Seizure  - Keppra  - No further activity  - able to follow commands and answer questions.  - EEG normal  3.  Essential HTN  - controlled  4. HL  - added atorvastatin 80  5. Hypokalemia  - replete per CCM   Yeng Perz, [place] 03/29/2015, 8:54 AM

## 2015-03-29 NOTE — Progress Notes (Signed)
PULMONARY / CRITICAL CARE MEDICINE   Name: Claire Mcdonald MRN: [id number] DOB: 14-Jan-1966    ADMISSION DATE:  03/25/2015  REFERRING MD :  [facility]  CHIEF COMPLAINT:  V-Fib/V-tach cardiac arrest and status epilepticus  INITIAL PRESENTATION: Noted by coworkers at [facility] [employer] to be having seizure-like activity. Received shock by AED with loss of consciouness.  STUDIES:  7/31 Head CT - no acute findings 7/31 Neck CT - no acute findings  SIGNIFICANT EVENTS: 7/31 - Appeared to be having seizure like activity on admission.  Neurology consulted.  Movements stopped on their own.  8/02 - LHC: Reportedly no obstructing CAD  SUBJECTIVE:  Patient successfully extubated this morning. Denies any chest pain or pressure. Denies any difficulty breathing off of ventilator.  ROS:  Denies any nausea or abdominal pain. No headache.  VITAL SIGNS: Temp:  [98.2 F (36.8 C)-99.7 F (37.6 C)] 98.4 F (36.9 C) (08/04 1146) Pulse Rate:  [88-123] 104 (08/04 1224) Resp:  [0-22] 14 (08/04 1224) BP: (72-132)/(35-110) 108/45 mmHg (08/04 1200) SpO2:  [96 %-100 %] 96 % (08/04 1224) FiO2 (%):  [30 %] 30 % (08/04 0752) Weight:  [222 lb 3.6 oz (100.8 kg)] 222 lb 3.6 oz (100.8 kg) (08/04 0314) HEMODYNAMICS:   VENTILATOR SETTINGS: Vent Mode:  [-] PSV;CPAP FiO2 (%):  [30 %] 30 % Set Rate:  [15 bmp] 15 bmp Vt Set:  [540 mL] 540 mL PEEP:  [5 cmH20] 5 cmH20 Pressure Support:  [0 cmH20-5 cmH20] 0 cmH20 Plateau Pressure:  [9 cmH20-27 cmH20] 23 cmH20 INTAKE / OUTPUT:  Intake/Output Summary (Last 24 hours) at 03/29/15 1354 Last data filed at 03/29/15 1200  Gross per 24 hour  Intake 1858.58 ml  Output   1450 ml  Net 408.58 ml    PHYSICAL EXAMINATION: General:  Awake. Alert post extubation. No distress. Integument:  Warm & dry. No rash on exposed skin. Bruising right wrist unchanged. HEENT:  Moist mucus membranes. No scleral icterus PERRL. Cardiovascular:  Tachycardic but regular rhythm. No  edema. Pulmonary:  Slightly decreased aeration bilateral lung bases. Normal work of breathing on nasal cannula oxygen. Abdomen: Soft. Normal bowel sounds. Nondistended.  Neurological:  Follows commands. Answers questions. Grossly nonfocal. Remains confused.  LABS:  CBC  Recent Labs Lab 03/27/15 1615 03/28/15 0415 03/29/15 0330  WBC 15.7* 14.1* 14.4*  HGB 12.2 11.8* 11.5*  HCT 36.9 36.2 35.4*  PLT 268 257 304   Coag's  Recent Labs Lab 03/25/15 1917 03/25/15 2140 03/26/15 1500  APTT  --  25 86*  INR 1.02  --  1.13   BMET  Recent Labs Lab 03/27/15 2022 03/28/15 0415 03/29/15 0330  NA 141 141 141  K 3.7 3.8 3.5  CL 112* 110 110  CO2 22 23 20*  BUN <5* <5* <5*  CREATININE 0.75 0.60 0.68  GLUCOSE 101* 102* 116*   Electrolytes  Recent Labs Lab 03/27/15 0435  03/27/15 2022 03/28/15 0415 03/29/15 0330  CALCIUM 7.9*  < > 8.0* 8.1* 8.4*  MG 2.0  --   --  2.1 2.2  PHOS 3.6  --   --  3.2 3.4  < > = values in this interval not displayed. Sepsis Markers  Recent Labs Lab 03/25/15 1917 03/25/15 2140 03/26/15 0145  LATICACIDVEN 3.1*  --  0.8  PROCALCITON  --  <0.10  --    ABG  Recent Labs Lab 03/25/15 1612 03/25/15 2155 03/26/15 0428  PHART 7.31* 7.356 7.346*  PCO2ART 40 35.7 34.8*  PO2ART 82*  194* 122.0*   Liver Enzymes  Recent Labs Lab 03/25/15 1435 03/25/15 2140 03/27/15 0435 03/28/15 0415 03/29/15 0330  AST 112* 113* 72*  --   --   ALT 86* 71* 61*  --   --   ALKPHOS 55 43 51  --   --   BILITOT 0.8 0.8 0.9  --   --   ALBUMIN 3.8 2.9* 2.8*  2.8* 2.6* 2.5*   Cardiac Enzymes  Recent Labs Lab 03/26/15 1520 03/26/15 1935 03/27/15 0155  TROPONINI 6.14* 4.67* 4.31*   Glucose  Recent Labs Lab 03/28/15 1625 03/28/15 2032 03/28/15 2320 03/29/15 0327 03/29/15 0726 03/29/15 1151  GLUCAP 88 101* 97 119* 118* 98    Imaging No results found.   ASSESSMENT / PLAN: PULMONARY OETT 7.5, 19cm at the lip, placed in ED 7/31 >>8/4 A:   Intubated for airway protection and apnea post arrest - now extubated.  P:   Continue nebs Wean FiO2 for Sat >92%  CARDIOVASCULAR CVL Left subclavian placed 7/31 >> A:  S/P v-fib vs. V-tach arrest  P:  Cardiology following Continue tele monitoring ASA 81mg  Lipitor 80mg  qhs Lopressor IV qid  RENAL A:   Hypokalemia - Resolved  P:   Trending urine output with Foley catheter Daily BUN/creatinine Repeat electrolytes AM daily  GASTROINTESTINAL A:   No acute issues  P:   NPO until passes swallow evaluation Bowel regimen Pepcid IV bid  HEMATOLOGIC A:   Leukocytosis - Stable Mild anemia - No signs of active bleeding  P:  Monitor Hgb w/ CBC daily  Trend Leukocytosis w/ daily CBC Heparin drip discontinued SCDs Heparin Wallace q8hr  INFECTIOUS A:   No acute issues  P:   Monitor for signs and sx of infection Daily   ENDOCRINE A:   Hypoglycemia    P:   Accu-Cheks every 4 hours D5LR @ 50cc/hr  NEUROLOGIC A:   Seizure - status epilepticus  P:   Neurology following  Plan for outpatient Neuro f/u Continuing Keppra EEG w/o seizure activity   FAMILY  - Updates: No family present today.  - Inter-disciplinary family meet or Palliative Care meeting due by:  8/7   TODAY'S SUMMARY: Patient successfully extubated. Plan for EP evaluation today.   I have spent a total of 41 minutes of critical care time today caring for this patient, discussing plan of care with RN & RT planning for extubation, and reviewing the patient's electronic medical record.   Sonia Baller Ashok Cordia, M.D. [facility]monary [facility] Critical [facility] Pager:  [phone] After 3pm or if no response, call [phone] 03/29/2015, 1:54 PM

## 2015-03-30 ENCOUNTER — Inpatient Hospital Stay (HOSPITAL_COMMUNITY): Payer: Medicaid Other

## 2015-03-30 DIAGNOSIS — I4901 Ventricular fibrillation: Secondary | ICD-10-CM

## 2015-03-30 LAB — BASIC METABOLIC PANEL
ANION GAP: 6 (ref 5–15)
BUN: 5 mg/dL — AB (ref 6–20)
CALCIUM: 7.8 mg/dL — AB (ref 8.9–10.3)
CO2: 26 mmol/L (ref 22–32)
Chloride: 111 mmol/L (ref 101–111)
Creatinine, Ser: 0.63 mg/dL (ref 0.44–1.00)
GFR calc Af Amer: >60 mL/min (ref >60)
Glucose, Bld: 145 mg/dL — ABNORMAL HIGH (ref 65–99)
POTASSIUM: 3 mmol/L — AB (ref 3.5–5.1)
Sodium: 143 mmol/L (ref 135–145)

## 2015-03-30 LAB — HEMOGLOBIN AND HEMATOCRIT, BLOOD
HEMATOCRIT: 31.6 % — AB (ref 36.0–46.0)
Hemoglobin: 10.2 g/dL — ABNORMAL LOW (ref 12.0–15.0)

## 2015-03-30 LAB — RENAL FUNCTION PANEL
Albumin: 2.2 g/dL — ABNORMAL LOW (ref 3.5–5.0)
Anion gap: 6 (ref 5–15)
BUN: 5 mg/dL — AB (ref 6–20)
CALCIUM: 7.8 mg/dL — AB (ref 8.9–10.3)
CO2: 26 mmol/L (ref 22–32)
CREATININE: 0.62 mg/dL (ref 0.44–1.00)
Chloride: 110 mmol/L (ref 101–111)
GFR calc Af Amer: >60 mL/min (ref >60)
GFR calc non Af Amer: >60 mL/min (ref >60)
GLUCOSE: 144 mg/dL — AB (ref 65–99)
POTASSIUM: 3 mmol/L — AB (ref 3.5–5.1)
Phosphorus: 2.4 mg/dL — ABNORMAL LOW (ref 2.5–4.6)
Sodium: 142 mmol/L (ref 135–145)

## 2015-03-30 LAB — CBC
HCT: 28.9 % — ABNORMAL LOW (ref 36.0–46.0)
Hemoglobin: 9.5 g/dL — ABNORMAL LOW (ref 12.0–15.0)
MCH: 30.6 pg (ref 26.0–34.0)
MCHC: 32.9 g/dL (ref 30.0–36.0)
MCV: 93.2 fL (ref 78.0–100.0)
PLATELETS: 254 10*3/uL (ref 150–400)
RBC: 3.1 MIL/uL — AB (ref 3.87–5.11)
RDW: 14.7 % (ref 11.5–15.5)
WBC: 7.7 10*3/uL (ref 4.0–10.5)

## 2015-03-30 LAB — MAGNESIUM: Magnesium: 2 mg/dL (ref 1.7–2.4)

## 2015-03-30 LAB — GLUCOSE, CAPILLARY
GLUCOSE-CAPILLARY: 143 mg/dL — AB (ref 65–99)
GLUCOSE-CAPILLARY: 152 mg/dL — AB (ref 65–99)
Glucose-Capillary: 145 mg/dL — ABNORMAL HIGH (ref 65–99)
Glucose-Capillary: 123 mg/dL — ABNORMAL HIGH (ref 65–99)

## 2015-03-30 MED ORDER — POTASSIUM CHLORIDE CRYS ER 20 MEQ PO TBCR
40.0000 meq | EXTENDED_RELEASE_TABLET | ORAL | Status: AC
  Administered 2015-03-30 (×2): 40 meq via ORAL
  Filled 2015-03-30 (×2): qty 2

## 2015-03-30 MED ORDER — PHENOL 1.4 % MT LIQD
1.0000 | OROMUCOSAL | Status: DC | PRN
  Administered 2015-03-31: 1 via OROMUCOSAL
  Filled 2015-03-30: qty 177

## 2015-03-30 MED ORDER — GADOBENATE DIMEGLUMINE 529 MG/ML IV SOLN
32.0000 mL | Freq: Once | INTRAVENOUS | Status: AC | PRN
  Administered 2015-03-30: 32 mL via INTRAVENOUS

## 2015-03-30 MED ORDER — METOPROLOL TARTRATE 12.5 MG HALF TABLET
12.5000 mg | ORAL_TABLET | Freq: Two times a day (BID) | ORAL | Status: DC
  Administered 2015-03-30 – 2015-04-12 (×26): 12.5 mg via ORAL
  Filled 2015-03-30 (×26): qty 1

## 2015-03-30 MED ORDER — HYDROCODONE-ACETAMINOPHEN 5-325 MG PO TABS
1.0000 | ORAL_TABLET | ORAL | Status: DC | PRN
  Administered 2015-03-30 – 2015-04-12 (×26): 1 via ORAL
  Filled 2015-03-30 (×28): qty 1

## 2015-03-30 NOTE — Progress Notes (Signed)
Attempted to call report to 3W. Receiving RN is going to call back for report.

## 2015-03-30 NOTE — Progress Notes (Signed)
    Subjective:  Extubated 8/4. Conversant. Obviously confused about chain of events surrounding cardiac arrest.    Objective:  Vital Signs in the last 24 hours: Temp:  [98.1 F (36.7 C)-99.1 F (37.3 C)] 98.6 F (37 C) (08/05 0800) Pulse Rate:  [73-131] 76 (08/05 0800) Resp:  [12-23] 15 (08/05 0800) BP: (76-121)/(37-103) 101/65 mmHg (08/05 0800) SpO2:  [93 %-100 %] 100 % (08/05 0800) Weight:  [228 lb 9.9 oz (103.7 kg)] 228 lb 9.9 oz (103.7 kg) (08/05 0427)  Intake/Output from previous day: 08/04 0701 - 08/05 0700 In: 1098.7 [I.V.:898.7; IV Piggyback:200] Out: 1301 [Urine:1300; Stool:1]   Physical Exam: General: Well developed, well nourished, on vent sedate now. Head:  Normocephalic and atraumatic. Lungs: Clear to auscultation and percussion. Heart: Normal S1 and S2.  No murmur, rubs or gallops.  Abdomen: soft, non-tender, positive bowel sounds. Obese Extremities: No clubbing or cyanosis. No edema.  Neurologic: AAO x 3.    Lab Results:  Recent Labs  03/29/15 0330 03/30/15 0530  WBC 14.4* 7.7  HGB 11.5* 9.5*  PLT 304 254    Recent Labs  03/30/15 0515 03/30/15 0530  NA 142 143  K 3.0* 3.0*  CL 110 111  CO2 26 26  GLUCOSE 144* 145*  BUN 5* 5*  CREATININE 0.62 0.63   No results for input(s): TROPONINI in the last 72 hours.  Invalid input(s): CK, MB Hepatic Function Panel  Recent Labs  03/30/15 0515  ALBUMIN 2.2*    Telemetry: Prior NSVT on tele. None currently. NSR/ sinus tachy Personally viewed.   EKG:  NSR, TWI inf/ant. Normal QT interval. Personally viewed  Cardiac Studies:  Cath no significant CAD (10,30% lesions).  Scheduled Meds: . albuterol  2.5 mg Nebulization Q4H  . aspirin  81 mg Oral Daily  . atorvastatin  80 mg Oral q1800  . famotidine (PEPCID) IV  20 mg Intravenous Q12H  . heparin  5,000 Units Subcutaneous 3 times per day  . levETIRAcetam  500 mg Intravenous Q12H  . metoprolol  5 mg Intravenous 4 times per day  . potassium  chloride  40 mEq Oral Q4H   Continuous Infusions: . dextrose 5% lactated ringers 10 mL/hr at 03/29/15 2119   PRN Meds:.sodium chloride, acetaminophen (TYLENOL) oral liquid 160 mg/5 mL, acetaminophen, bisacodyl, ondansetron (ZOFRAN) IV, promethazine, sennosides  Assessment/Plan:  Principal Problem:   Cardiac arrest Active Problems:   Status epilepticus   Seizure   Essential hypertension   Hyperlipemia  49 year old with VT/VF arrest (DC CV x 2 by AED), no significant CAD by cath.  1. Cardiac arrest  - Cath with 10%, 30% lesion. No significant CAD. EF normalized.   - extubated  - Trop peak 6.3  - No prolonged QT but TWI noted on ECG  - no prior cardiac hisory  - Family members Hassan Rowan and Alvester Chou - Daughter and Son) - ASA   - Heparin DVT proph - change to PO metoprolol 25mg  PO BID with hold SBP < 95.  - added atorvastatin 80mg   - ECHO -normal EF, mild LVH - Now that she is extubated will consult EP service -ICD.   2. Seizure  - Keppra  - No further activity  - able to follow commands and answer questions.  - EEG normal   3. Essential HTN  - controlled  4. HL  - added atorvastatin 80  5. Hypokalemia  - replete per CCM   Khadeeja Elden 03/30/2015, 9:05 AM

## 2015-03-30 NOTE — Progress Notes (Signed)
Nutrition Follow-up  DOCUMENTATION CODES:   Morbid obesity  INTERVENTION:   -Continue with Heart Healthy diet  NUTRITION DIAGNOSIS:   Inadequate oral intake related to inability to eat as evidenced by NPO status.  Resolved  GOAL:   Patient will meet greater than or equal to 90% of their needs  Met  MONITOR:   PO intake, Labs, Weight trends, Skin, I & O's  REASON FOR ASSESSMENT:   Consult Assessment of nutrition requirement/status  ASSESSMENT:   Claire Mcdonald is 49 y/o woman with past medical history of hypertension, chronic pain and depression who is on gabapentin and prn topomax. She was working at [employer] this evening when her coworkers called EMS to report seizure like activity. When EMS arrived she was placed on an AED and shock was advised. She was shocked twice and a King airway placed. She was noted to again have seizure like activity in the ambulance and in the ED at [facility]. She was treated with ativan and keppra. She was transferred to [facility] for cooling after out of hospital arrest.  Pt extubated on 03/29/15. Nutrition needs re-estimated due to change in status.   Spoke with pt at bedside. She complains of a sore throat, likely due to OGT removal. She is tolerating food and liquids well; she was just advanced to a heart healthy diet. She consumed her bagel, sausage, coffee, fruit, and a few bites of eggs this morning.   She reports good appetite and no weight loss PTA, however, admits that she did not eat on the day of her seizure.   Pt does not recall what she ordered for lunch; requesting strawberry fields salad and angel food cake. Review menu options with pt. Called [facility] to modify lunch order. Pt expressed appreciation.   Discussed importance of good PO intake to promote healing.  Labs reviewed: K: 3.0.  Diet Order:  Diet Heart Room service appropriate?: Yes; Fluid consistency:: Thin  Skin:  Reviewed, no issues  Last BM:  03/28/15  Height:   Ht  Readings from Last 1 Encounters:  03/26/15 '5\' 3"'  (1.6 m)    Weight:   Wt Readings from Last 1 Encounters:  03/30/15 228 lb 9.9 oz (103.7 kg)    Ideal Body Weight:  52.3 kg  BMI:  Body mass index is 40.51 kg/(m^2).  Estimated Nutritional Needs:   Kcal:  1850-2050  Protein:  95-105 grams  Fluid:  1.9-2.1 L  EDUCATION NEEDS:   Education needs addressed  Claire Mcdonald, RD, LDN, CDE Pager: [phone] After hours Pager: [phone]

## 2015-03-30 NOTE — Evaluation (Signed)
Physical Therapy Evaluation Patient Details Name: Claire Mcdonald MRN: [id number] DOB: Sep 29, 1965 Today's Date: 03/30/2015   History of Present Illness  [name] is 49 y/o woman with past medical history of hypertension, chronic pain and depression who is on gabapentin and prn topomax. She was working on evening of admit when her coworkers called EMS to report seizure like activity.She was shocked twice and a King airway placed. She was noted to again have seizure like activity in the ambulance and in the ED at [facility]. She was extubated on 8/4.  Clinical Impression  Patient found in the chair and agreeable to participate in PT today. She was able to do exercises in the chair and ambulate as listed below, limited by HR response (see Vitals). Patient will benefit from continued PT to address exercise tolerance, decreased endurance and independence with transfers.    Follow Up Recommendations Home health PT;Supervision/Assistance - 24 hour    Equipment Recommendations  Rolling walker with 5" wheels    Recommendations for Other Services       Precautions / Restrictions Restrictions Weight Bearing Restrictions: No      Mobility  Bed Mobility Overal bed mobility:  (Pt found in chair.)                Transfers Overall transfer level: Needs assistance Equipment used: Rolling walker (2 wheeled) Transfers: Sit to/from Stand Sit to Stand: Min guard;+2 safety/equipment         General transfer comment: Needed monitoring of vitals as well as cues to push up from arms of chair.   Ambulation/Gait Ambulation/Gait assistance: Min guard;+2 safety/equipment (Follow with chair, monitor vitals.) Ambulation Distance (Feet): 30 Feet Assistive device: Rolling walker (2 wheeled) Gait Pattern/deviations: Step-through pattern;Decreased stride length;Shuffle;Narrow base of support   Gait velocity interpretation: Below normal speed for age/gender General Gait Details: Required cues for use of  RW and to slow down due to HR increase (peak 128 bpm, immediately lowered with rest). Patient had one episode of mild LOB that required min assist  Stairs            Wheelchair Mobility    Modified Rankin (Stroke Patients Only)       Balance Overall balance assessment: Needs assistance Sitting-balance support: Feet supported;No upper extremity supported Sitting balance-Leahy Scale: Good     Standing balance support: Bilateral upper extremity supported;During functional activity Standing balance-Leahy Scale: Good Standing balance comment: Was able to assist with dressing while standing with the RW, including lifting R and L LE.                             Pertinent Vitals/Pain Pain Assessment: No/denies pain  HR at rest = 93 - 97 bpm HR during exercise in the chair = 115 - 122 bpm HR during ambulation = 115 - 128 bpm HR at rest after ambulation = 97 bpm    Home Living Family/patient expects to be discharged to:: Private residence Living Arrangements: Children Available Help at Discharge: Family;Available 24 hours/day (2 sons) Type of Home: Apartment Home Access: Stairs to enter Entrance Stairs-Rails: Right Entrance Stairs-Number of Steps: 8 Home Layout: One level Home Equipment: None      Prior Function Level of Independence: Independent               Hand Dominance        Extremity/Trunk Assessment  Lower Extremity Assessment: Generalized weakness (C/o soreness and fatigue in legs due to laying in bed.)         Communication   Communication: No difficulties  Cognition Arousal/Alertness: Awake/alert Behavior During Therapy: WFL for tasks assessed/performed Overall Cognitive Status: Within Functional Limits for tasks assessed                      General Comments      Exercises General Exercises - Lower Extremity Ankle Circles/Pumps: AROM;Both;20 reps;Seated Heel Slides: AROM;Both;10  reps;Seated Straight Leg Raises: AROM;Both;10 reps;Seated      Assessment/Plan    PT Assessment Patient needs continued PT services  PT Diagnosis Difficulty walking;Abnormality of gait;Generalized weakness;Acute pain   PT Problem List Decreased strength;Decreased range of motion;Decreased activity tolerance;Decreased balance;Decreased mobility;Decreased knowledge of use of DME;Cardiopulmonary status limiting activity;Obesity  PT Treatment Interventions DME instruction;Gait training;Stair training;Functional mobility training;Therapeutic activities;Therapeutic exercise;Balance training;Patient/family education   PT Goals (Current goals can be found in the Care Plan section) Acute Rehab PT Goals Patient Stated Goal: to return home and get stronger PT Goal Formulation: With patient Time For Goal Achievement: 04/13/15 Potential to Achieve Goals: Good    Frequency Min 3X/week   Barriers to discharge Inaccessible home environment Must navigate 8 stairs to enter apartment, pt desires to file for assistance/disability at apartment complex.    Co-evaluation               End of Session   Activity Tolerance: Patient tolerated treatment well;Treatment limited secondary to medical complications (Comment) (Increased HR response to ambulation.) Patient left:  (In w/c in care of RN for transport to MRI and new room.) Nurse Communication: Mobility status         Time: 1500-1540 PT Time Calculation (min) (ACUTE ONLY): 40 min   Charges:   PT Evaluation $Initial PT Evaluation Tier I: 1 Procedure PT Treatments $Gait Training: 8-22 mins $Therapeutic Exercise: 8-22 mins   PT G CodesRoanna Epley, SPT [phone]  03/30/2015, 4:39 PM  I have read, reviewed and agree with student's note.   [place] [phone] (pager)

## 2015-03-30 NOTE — Consult Note (Signed)
ELECTROPHYSIOLOGY CONSULT NOTE    Patient ID: Claire Mcdonald MRN: [id number], DOB/AGE: 47-Feb-1967 47 y.o.  Admit date: 03/25/2015 Date of Consult: 03/30/2015  Primary Physician: Golden Pop, MD Primary Cardiologist: Meda Coffee - new this admission  Reason for Consultation: VF arrest  HPI:  Claire Mcdonald is a 49 y.o. female with a past medical history of hypertension, hyperlipidemia, anxiety, depression, and obesity. She was at work on the day of admission when she collapsed.  AED was applied and advised shock x2.  Upon EMS arrival, she was intubated and transferred to [facility].  She was transferred to [facility] for further evaluation. She has been extubated and is now awake and alert.  She does not remember her arrest.  Cardiac catheterization demonstrated mild non obstructive CAD and normal LV function. Echocardiogram demonstrated EF 55-60%, mild LVH, no RWMA.  K was 3.7 on admission.  She has not had prior syncope. There is no family history of sudden death, drowning, or syncope. She does have a history of tachypalpitations for which she was placed on Metoprolol.  She reports compliance with home medications and no recent fevers, chills, nausea, or diarrhea.  She has not had chest pain or shortness of breath.  She works at [employer] and does not smoke, drink, or use recreational drugs. EP has been asked to evaluate for treatment options.   Past Medical History  Diagnosis Date  . Hypertension   . High cholesterol   . Anxiety   . Depression   . Allergy   . GERD (gastroesophageal reflux disease)   . Migraine   . Diverticulosis     seen on CT 2015  . Obesity (BMI 30-39.9)      Surgical History:  Past Surgical History  Procedure Laterality Date  . Tubal ligation  1998  . Ovarian cyst removal Left   . Central line  03/25/2015       . Hand surgery Left   . Cardiac catheterization N/A 03/27/2015    Procedure: Left Heart Cath and Coronary Angiography;  Surgeon: Troy Sine, MD;  Location: [facility];  Service: Cardiovascular;  Laterality: N/A;     Prescriptions prior to admission  Medication Sig Dispense Refill Last Dose  . DULoxetine (CYMBALTA) 60 MG capsule Take 60 mg by mouth daily.   unknown  . furosemide (LASIX) 20 MG tablet Take 20 mg by mouth daily.   unknown  . metoprolol succinate (TOPROL-XL) 100 MG 24 hr tablet Take 100 mg by mouth daily. Take with or immediately following a meal.   unknown  . albuterol (PROVENTIL HFA;VENTOLIN HFA) 108 (90 BASE) MCG/ACT inhaler Inhale 2 puffs into the lungs every 6 (six) hours as needed for wheezing or shortness of breath. (Patient not taking: Reported on 03/12/2015) 1 Inhaler 0 Not Taking at Unknown time  . amoxicillin (AMOXIL) 875 MG tablet Take 1 tablet (875 mg total) by mouth 2 (two) times daily. (Patient not taking: Reported on 03/12/2015) 20 tablet 0 Not Taking at Unknown time  . fluticasone (FLONASE) 50 MCG/ACT nasal spray Place 1 spray into both nostrils daily. (Patient not taking: Reported on 03/26/2015) 16 g 0 Not Taking at Unknown time  . gabapentin (NEURONTIN) 300 MG capsule Take 300 mg by mouth as needed.    unknown  . HYDROcodone-acetaminophen (NORCO/VICODIN) 5-325 MG per tablet Take 2 tablets by mouth every 4 (four) hours as needed for pain. (Patient not taking: Reported on 03/12/2015) 10 tablet 0 Not Taking at Unknown time  .  metoCLOPramide (REGLAN) 5 MG tablet Take 1 tablet (5 mg total) by mouth every 6 (six) hours as needed for nausea. (Patient not taking: Reported on 03/12/2015) 10 tablet 0 Not Taking at Unknown time  . naproxen (NAPROSYN) 500 MG tablet Take 1 tablet (500 mg total) by mouth 2 (two) times daily with a meal. (Patient not taking: Reported on 03/12/2015) 20 tablet 0 Not Taking at Unknown time  . promethazine (PHENERGAN) 25 MG tablet Take 1 tablet (25 mg total) by mouth every 6 (six) hours as needed for nausea. (Patient not taking: Reported on 03/12/2015) 20 tablet 0 Not Taking at Unknown time  . topiramate (TOPAMAX) 100 MG  tablet Take 100 mg by mouth 2 (two) times daily.   unknown    Inpatient Medications:  . albuterol  2.5 mg Nebulization Q4H  . aspirin  81 mg Oral Daily  . atorvastatin  80 mg Oral q1800  . heparin  5,000 Units Subcutaneous 3 times per day  . levETIRAcetam  500 mg Intravenous Q12H  . metoprolol tartrate  12.5 mg Oral BID    Allergies:  Allergies  Allergen Reactions  . Toradol [Ketorolac Tromethamine] Itching and Rash  . Tramadol Itching and Rash  . Zofran [Ondansetron Hcl] Itching and Rash    History   Social History  . Marital Status: Single    Spouse Name: N/A  . Number of Children: N/A  . Years of Education: N/A   Occupational History  . Not on file.   Social History Main Topics  . Smoking status: Never Smoker   . Smokeless tobacco: Never Used  . Alcohol Use: No  . Drug Use: No  . Sexual Activity: Yes    Birth Control/ Protection: Surgical   Other Topics Concern  . Not on file   Social History Narrative   Lives in [place], [place].     Family History  Problem Relation Age of Onset  . Diabetes Father   . Asthma Father   . Hyperlipidemia Father   . Hypertension Father   . Kidney disease Father   . Heart disease Neg Hx   . Stroke Neg Hx   . Asthma Mother   . Depression Mother   . Hyperlipidemia Mother   . Hypertension Mother   . Cancer Maternal Grandmother   . Early death Maternal Grandmother   . Early death Paternal Grandmother      Review of Systems: All other systems reviewed and are otherwise negative except as noted above.  Physical Exam: Filed Vitals:   03/30/15 0900 03/30/15 0922 03/30/15 1000 03/30/15 1100  BP: 124/73  115/60 111/75  Pulse: 93  87 92  Temp:      TempSrc:      Resp: 20  18 19   Height:      Weight:      SpO2: 100% 97% 96% 95%    GEN- The patient is obese and tearful appearing, alert and oriented x 3 today.   HEENT: normocephalic, atraumatic; sclera clear, conjunctiva pink; hearing intact; oropharynx clear; neck supple    Lungs- Clear to ausculation bilaterally, normal work of breathing.  No wheezes, rales, rhonchi Heart- Regular rate and rhythm, no murmurs, rubs or gallops  GI- soft, non-tender, non-distended, bowel sounds present  Extremities- no clubbing, cyanosis, or edema; DP/PT/radial pulses 2+ bilaterally MS- no significant deformity or atrophy Skin- warm and dry, no rash or lesion Psych- euthymic mood, full affect Neuro- strength and sensation are intact  Labs:   Lab Results  Component Value Date   WBC 7.7 03/30/2015   HGB 9.5* 03/30/2015   HCT 28.9* 03/30/2015   MCV 93.2 03/30/2015   PLT 254 03/30/2015    Recent Labs Lab 03/27/15 0435  03/30/15 0530  NA 139  < > 143  K 2.9*  < > 3.0*  CL 108  < > 111  CO2 22  < > 26  BUN 7  < > 5*  CREATININE 0.74  < > 0.63  CALCIUM 7.9*  < > 7.8*  PROT 5.5*  --   --   BILITOT 0.9  --   --   ALKPHOS 51  --   --   ALT 61*  --   --   AST 72*  --   --   GLUCOSE 127*  < > 145*  < > = values in this interval not displayed.    Radiology/Studies: Dg Chest Port 1 View 03/25/2015   CLINICAL DATA:  Central line placement.  Hypothermia.  EXAM: PORTABLE CHEST - 1 VIEW  COMPARISON:  03/25/2015.  FINDINGS: Support apparatus: Endotracheal tube tip 1 cm from the carina. Enteric tube is present doubled back upon itself with the tip at the cardia of the stomach.  New LEFT subclavian central line is present. The tip is in the lower superior vena cava at the cavoatrial junction.  Cardiomediastinal Silhouette:  Unchanged.  Lungs: Opacity at the LEFT costophrenic angle, likely representing atelectasis. Airspace disease could also produce this appearance. No pneumothorax.  Effusions:  None.  Other:  None.  IMPRESSION: 1. Support apparatus as described above. The endotracheal tube has been retracted but remains 1 cm from the carina. 2. Uncomplicated interval placement of LEFT subclavian central line with the tip at the junction of the superior vena cava and RIGHT atrium. 3.  Opacification at the LEFT costophrenic angle which may represent atelectasis or airspace disease.   Electronically Signed   By: Dereck Ligas M.D.   On: 03/25/2015 21:01   QJJ:HERDE rhythm, rate 80, inverted T waves across precordium, QTc 426  TELEMETRY: sinus rhythm, rare PVC's  Assessment/Plan: 1.  Resuscitated VF arrest The patient has had a resuscitated arrest. Cardiac catheterization with non obstructive CAD and normal LV function.  There is no family history of sudden death or syncope. Will pursue further work up with cardiac MRI and SAEKG.  Discussed Flecanide challenge with Dr Lovena Le - would prefer to wait until after results of [place] and MRI are obtained. Dr Caryl Comes to address this weekend.  Keep K>3.9, Mg >1.8 No driving x6 months Will ultimately need secondary prevention ICD prior to discharge (tentatively on the schedule for Monday)  2.  HTN Stable No change required today   Dr Lovena Le to see later today.    Signed, Chanetta Marshall, NP 03/30/2015 11:54 AM  EP Attending  Patient seen and examined. Agree with above. The patient has sustained a VF arrest of unclear etiology. She is now awake and alert and her memory appears intact. There was no h/o syncope and no family history. Her QT interval does not appear prolonged and we do not have tele strips but she was shocked twice by the AED in the field, restoring spontaneous circulation. She is pending a cardiac MRI and a signal average ECG. If these studies are unrevealing, then she will need both epinephrine and flecainide challenge. Dr. Caryl Comes to follow the patient in my absence next week.  Mikle Bosworth.D.

## 2015-03-30 NOTE — Progress Notes (Signed)
Report called to RN on 3W

## 2015-03-30 NOTE — Progress Notes (Signed)
PULMONARY / CRITICAL CARE MEDICINE   Name: Claire Mcdonald MRN: [id number] DOB: [birth year]    ADMISSION DATE:  03/25/2015  REFERRING MD :  [facility]  CHIEF COMPLAINT:  V-Fib/V-tach cardiac arrest and status epilepticus  INITIAL PRESENTATION: Noted by coworkers at [facility] to be having seizure-like activity. Received shock by AED with loss of consciouness.  STUDIES:  7/31 Head CT - no acute findings 7/31 Neck CT - no acute findings  SIGNIFICANT EVENTS: 7/31 - Appeared to be having seizure like activity on admission.  Neurology consulted.  Movements stopped on their own.  8/02 - LHC: Reportedly no obstructing CAD  SUBJECTIVE:  Successfully extubated yesterday. She does endorse chest wall pain today. Denies any dyspnea or cough. She is tolerating diet. Patient more appropriate today but still does not recall the event.  ROS:  Denies any nausea or abdominal pain. No headache or vision changes.  VITAL SIGNS: Temp:  [98.1 F (36.7 C)-99.1 F (37.3 C)] 98.6 F (37 C) (08/05 0800) Pulse Rate:  [73-131] 87 (08/05 1000) Resp:  [12-23] 18 (08/05 1000) BP: (76-124)/(37-103) 115/60 mmHg (08/05 1000) SpO2:  [93 %-100 %] 96 % (08/05 1000) Weight:  [228 lb 9.9 oz (103.7 kg)] 228 lb 9.9 oz (103.7 kg) (08/05 0427) HEMODYNAMICS:   VENTILATOR SETTINGS:   INTAKE / OUTPUT:  Intake/Output Summary (Last 24 hours) at 03/30/15 1058 Last data filed at 03/30/15 1000  Gross per 24 hour  Intake 1022.67 ml  Output   1201 ml  Net -178.33 ml    PHYSICAL EXAMINATION: General:  Awake. Alert. No distress. Sitting up in bed watching TV and eating breakfast. Integument:  Warm & dry. No rash on exposed skin. Bruising right wrist unchanged. HEENT:  Moist mucus membranes. No scleral icterus. PERRL. Cardiovascular:  Regular rhythm. No edema. No appreciable JVD. Pulmonary:  Mild crackles bilateral lung bases. Normal work of breathing on nasal cannula oxygen. Speaking in complete sentences. Abdomen:  Soft. Normal bowel sounds. Nondistended.  Neurological:  Follows commands. She is aware of the year, president, and season. She is also aware that she is in hospital. She does not remember the events of her code. Musculoskeletal: Chest wall pain to palpation.  LABS:  CBC  Recent Labs Lab 03/28/15 0415 03/29/15 0330 03/30/15 0530  WBC 14.1* 14.4* 7.7  HGB 11.8* 11.5* 9.5*  HCT 36.2 35.4* 28.9*  PLT 257 304 254   Coag's  Recent Labs Lab 03/25/15 1917 03/25/15 2140 03/26/15 1500  APTT  --  25 86*  INR 1.02  --  1.13   BMET  Recent Labs Lab 03/29/15 0330 03/30/15 0515 03/30/15 0530  NA 141 142 143  K 3.5 3.0* 3.0*  CL 110 110 111  CO2 20* 26 26  BUN <5* 5* 5*  CREATININE 0.68 0.62 0.63  GLUCOSE 116* 144* 145*   Electrolytes  Recent Labs Lab 03/28/15 0415 03/29/15 0330 03/30/15 0515 03/30/15 0530  CALCIUM 8.1* 8.4* 7.8* 7.8*  MG 2.1 2.2  --  2.0  PHOS 3.2 3.4 2.4*  --    Sepsis Markers  Recent Labs Lab 03/25/15 1917 03/25/15 2140 03/26/15 0145  LATICACIDVEN 3.1*  --  0.8  PROCALCITON  --  <0.10  --    ABG  Recent Labs Lab 03/25/15 1612 03/25/15 2155 03/26/15 0428  PHART 7.31* 7.356 7.346*  PCO2ART 40 35.7 34.8*  PO2ART 82* 194* 122.0*   Liver Enzymes  Recent Labs Lab 03/25/15 1435 03/25/15 2140 03/27/15 0435 03/28/15 0415 03/29/15 0330  03/30/15 0515  AST 112* 113* 72*  --   --   --   ALT 86* 71* 61*  --   --   --   ALKPHOS 55 43 51  --   --   --   BILITOT 0.8 0.8 0.9  --   --   --   ALBUMIN 3.8 2.9* 2.8*  2.8* 2.6* 2.5* 2.2*   Cardiac Enzymes  Recent Labs Lab 03/26/15 1520 03/26/15 1935 03/27/15 0155  TROPONINI 6.14* 4.67* 4.31*   Glucose  Recent Labs Lab 03/29/15 1151 03/29/15 1656 03/29/15 2007 03/29/15 2344 03/30/15 0425 03/30/15 0756  GLUCAP 98 119* 140* 152* 145* 143*    Imaging No results found.   ASSESSMENT / PLAN: PULMONARY OETT 7.5, 19cm at the lip, placed in ED 7/31 >>8/4 A:  Intubated for  airway protection and apnea post arrest - now extubated.  P:   Continue nebs Wean FiO2 for Sat >92%  CARDIOVASCULAR CVL Left subclavian placed 7/31 >> A:  S/P v-fib vs. V-tach arrest  P:  Cardiology following Continue tele monitoring ASA 81mg  Lipitor 80mg  qhs Metoprolol by mouth twice a day with hold parameters  RENAL A:   Hypokalemia - replacing by mouth  P:   DC Foley catheter Daily BUN/creatinine Repeat electrolytes AM daily  GASTROINTESTINAL A:   No acute issues  P:   Heart healthy diet  HEMATOLOGIC A:   Leukocytosis - resolved Anemia - no evidence of active bleeding  P:  Repeat hemoglobin/hematocrit at 1400 hrs. today Monitor Hgb w/ CBC daily  Trend Leukocytosis w/ daily CBC SCDs Heparin [place] q8hr  INFECTIOUS A:   No acute issues  P:   Monitor for signs and sx of infection Daily CBC to monitor leukocyte count  ENDOCRINE A:   Hypoglycemia    P:   Accu-Cheks every before meals & at bedtime Discontinue dextrose infusion  NEUROLOGIC A:   Seizure - status epilepticus  P:   Neurology assessed Plan for outpatient Neuro f/u Continuing Keppra EEG w/o seizure activity   FAMILY  - Updates: No family present today.  - Inter-disciplinary family meet or Palliative Care meeting due by:  8/7   TODAY'S SUMMARY: 49 year old female with out of hospital witnessed arrest. There is some question whether or not the patient may have had a seizure that precipitated this event or whether or not a seizure followed her arrest. She did receive a shock by the AED at her place of work. She is going to be evaluated by EP. Cardiology is following. Neurology plans for outpatient follow-up. Continuing Keppra for AED. Patient now off of heparin drip and status post left heart catheterization with no obstructing coronary lesions. Transitioning patient to medical floor with telemetry monitoring and hospitalist service.   Sonia Baller Ashok Cordia, M.D. [facility]  Critical [facility] Pager:  [phone] After 3pm or if no response, call [phone] 03/30/2015, 10:58 AM

## 2015-03-31 LAB — RENAL FUNCTION PANEL
ALBUMIN: 2.3 g/dL — AB (ref 3.5–5.0)
Anion gap: 6 (ref 5–15)
BUN: 7 mg/dL (ref 6–20)
CO2: 26 mmol/L (ref 22–32)
CREATININE: 0.58 mg/dL (ref 0.44–1.00)
Calcium: 8.1 mg/dL — ABNORMAL LOW (ref 8.9–10.3)
Chloride: 107 mmol/L (ref 101–111)
GLUCOSE: 100 mg/dL — AB (ref 65–99)
POTASSIUM: 3.6 mmol/L (ref 3.5–5.1)
Phosphorus: 2.3 mg/dL — ABNORMAL LOW (ref 2.5–4.6)
SODIUM: 139 mmol/L (ref 135–145)

## 2015-03-31 LAB — GLUCOSE, CAPILLARY
GLUCOSE-CAPILLARY: 102 mg/dL — AB (ref 65–99)
Glucose-Capillary: 104 mg/dL — ABNORMAL HIGH (ref 65–99)

## 2015-03-31 LAB — CBC
HCT: 31.4 % — ABNORMAL LOW (ref 36.0–46.0)
HEMOGLOBIN: 10 g/dL — AB (ref 12.0–15.0)
MCH: 29.7 pg (ref 26.0–34.0)
MCHC: 31.8 g/dL (ref 30.0–36.0)
MCV: 93.2 fL (ref 78.0–100.0)
PLATELETS: 273 10*3/uL (ref 150–400)
RBC: 3.37 MIL/uL — ABNORMAL LOW (ref 3.87–5.11)
RDW: 14.6 % (ref 11.5–15.5)
WBC: 7.5 10*3/uL (ref 4.0–10.5)

## 2015-03-31 LAB — MAGNESIUM: Magnesium: 1.8 mg/dL (ref 1.7–2.4)

## 2015-03-31 MED ORDER — MAGNESIUM OXIDE 400 (241.3 MG) MG PO TABS
400.0000 mg | ORAL_TABLET | Freq: Every day | ORAL | Status: DC
  Administered 2015-03-31 – 2015-04-12 (×13): 400 mg via ORAL
  Filled 2015-03-31 (×13): qty 1

## 2015-03-31 MED ORDER — FLECAINIDE ACETATE 100 MG PO TABS
300.0000 mg | ORAL_TABLET | Freq: Once | ORAL | Status: AC
  Administered 2015-03-31: 300 mg via ORAL
  Filled 2015-03-31: qty 3

## 2015-03-31 MED ORDER — POTASSIUM CHLORIDE CRYS ER 20 MEQ PO TBCR
40.0000 meq | EXTENDED_RELEASE_TABLET | Freq: Once | ORAL | Status: AC
  Administered 2015-03-31: 40 meq via ORAL
  Filled 2015-03-31: qty 2

## 2015-03-31 MED ORDER — LEVETIRACETAM 500 MG PO TABS
500.0000 mg | ORAL_TABLET | Freq: Two times a day (BID) | ORAL | Status: DC
  Administered 2015-03-31 – 2015-04-12 (×24): 500 mg via ORAL
  Filled 2015-03-31 (×24): qty 1

## 2015-03-31 NOTE — Progress Notes (Signed)
Patient Name: Claire Mcdonald      SUBJECTIVE:chest pain with coughing reasonbalbe understanding of what happened and the paln  Past Medical History  Diagnosis Date  . Hypertension   . High cholesterol   . Anxiety   . Depression   . Allergy   . GERD (gastroesophageal reflux disease)   . Migraine   . Diverticulosis     seen on CT 2015  . Obesity (BMI 30-39.9)     Scheduled Meds:  Scheduled Meds: . albuterol  2.5 mg Nebulization Q4H  . aspirin  81 mg Oral Daily  . atorvastatin  80 mg Oral q1800  . heparin  5,000 Units Subcutaneous 3 times per day  . levETIRAcetam  500 mg Intravenous Q12H  . metoprolol tartrate  12.5 mg Oral BID   Continuous Infusions:  sodium chloride, acetaminophen (TYLENOL) oral liquid 160 mg/5 mL, acetaminophen, HYDROcodone-acetaminophen, ondansetron (ZOFRAN) IV, phenol, promethazine    PHYSICAL EXAM Filed Vitals:   03/31/15 0437 03/31/15 0444 03/31/15 1000 03/31/15 1125  BP: 123/80  131/83   Pulse: 99  94   Temp: 97.9 F (36.6 C)     TempSrc: Oral     Resp:      Height:      Weight: 224 lb 3.2 oz (101.696 kg)     SpO2: 98% 96%  97%    Well developed and nourished in no acute distress HENT normal Neck supple with JVP-flat Clear Regular rate and rhythm, no murmurs or gallops Abd-soft with active BS No Clubbing cyanosis edema Skin-warm and dry A & Oriented  Grossly normal sensory and motor function   TELEMETRY: Reviewed telemetry pt in nsr:    Intake/Output Summary (Last 24 hours) at 03/31/15 1207 Last data filed at 03/31/15 0600  Gross per 24 hour  Intake    740 ml  Output    775 ml  Net    -35 ml    LABS: Basic Metabolic Panel:  Recent Labs Lab 03/27/15 1615 03/27/15 2022 03/28/15 0415 03/29/15 0330 03/30/15 0515 03/30/15 0530 03/31/15 0445  NA 141 141 141 141 142 143 139  K 4.0 3.7 3.8 3.5 3.0* 3.0* 3.6  CL 110 112* 110 110 110 111 107  CO2 23 22 23  20* 26 26 26   GLUCOSE 110* 101* 102* 116* 144* 145*  100*  BUN 6 <5* <5* <5* 5* 5* 7  CREATININE 0.70 0.75 0.60 0.68 0.62 0.63 0.58  CALCIUM 8.1* 8.0* 8.1* 8.4* 7.8* 7.8* 8.1*  MG  --   --  2.1 2.2  --  2.0 1.8  PHOS  --   --  3.2 3.4 2.4*  --  2.3*   Cardiac Enzymes: No results for input(s): CKTOTAL, CKMB, CKMBINDEX, TROPONINI in the last 72 hours. CBC:  Recent Labs Lab 03/25/15 1435  03/26/15 0500 03/27/15 0435 03/27/15 1615 03/28/15 0415 03/29/15 0330 03/30/15 0530 03/30/15 1400 03/31/15 0445  WBC 8.8  < > 15.9* 16.7* 15.7* 14.1* 14.4* 7.7  --  7.5  NEUTROABS 5.7  --   --   --   --   --   --   --   --   --   HGB 14.4  < > 14.0 12.7 12.2 11.8* 11.5* 9.5* 10.2* 10.0*  HCT 44.3  < > 42.1 38.3 36.9 36.2 35.4* 28.9* 31.6* 31.4*  MCV 92.5  < > 93.1 91.4 92.0 92.3 93.2 93.2  --  93.2  PLT 283  < > 260  265 268 257 304 254  --  273  < > = values in this interval not displayed. PROTIME: No results for input(s): LABPROT, INR in the last 72 hours. Liver Function Tests:  Recent Labs  03/30/15 0515 03/31/15 0445  ALBUMIN 2.2* 2.3*   No results for input(s): LIPASE, AMYLASE in the last 72 hours. BNP: BNP (last 3 results)  Recent Labs  03/25/15 2140  BNP 146.1*    ProBNP (last 3 results) No results for input(s): PROBNP in the last 8760 hours.  D-Dimer: No results for input(s): DDIMER in the last 72 hours. Hemoglobin A1C: No results for input(s): HGBA1C in the last 72 hours. Fasting Lipid Panel:  Recent Labs  03/28/15 2120  TRIG 93    MRI mild gad enhancement   ASSESSMENT AND PLAN:  Principal Problem:   Cardiac arrest Active Problems:   Status epilepticus   Seizure   Essential hypertension   Hyperlipemia  Cause of arrest not clear although the MRI is not normal   The suggestion taht it could be related to infarct begs interpretation, as no CAD  Thus this then invoke spasm or embolism  Will give flecainide challenge And anticipate ICD implantation for secondary prevention on MOnday Will do outpt gene  testing and GXT for CPVT  Now need to mobilize and work on [employer], Virl Axe MD  03/31/2015

## 2015-03-31 NOTE — Progress Notes (Addendum)
[facility] PROGRESS NOTE  Claire Mcdonald DTO:[id number] DOB: [birth year] DOA: 03/25/2015 PCP: Claire Pop, MD  Assessment/Plan: Principal Problem:   Cardiac arrest Active Problems:   Status epilepticus   Seizure   Essential hypertension   Hyperlipemia    Resuscitated VF arrest-patient has had a resuscitated arrest. Cardiac catheterization with non obstructive CAD and normal LV function. There is no family history of sudden death or syncope. Needs  further work up with cardiac MRI and SAEKG.  Flecanide challenge by  Dr Caryl Comes -  Keep K>3.9, Mg >1.8. No driving x6 months. Patient will ultimately need an ICD,  Endotracheal intubation for airway protection status post   cardiac arrest Now extubated, on room air  Dyslipidemia-continue statin  Hypertension-continue metoprolol  Hypokalemia keep potassium greater than 4.0  Status epilepticus-seizure disorder, seen by neurology, continue Keppra change IV to by mouth, EEG w/o seizure activity   Code Status:      Code Status Orders        Start     Ordered   03/27/15 1450  Full code   Continuous     03/27/15 1454     Family Communication: family updated about patient's clinical progress Disposition Plan:  As above    Brief narrative: 49 y.o. female with a past medical history of hypertension, hyperlipidemia, anxiety, depression, and obesity. She was at work on the day of admission when she collapsed. AED was applied and advised shock x2. Upon EMS arrival, she was intubated and transferred to [facility]. She was transferred to [facility] for further evaluation. She has been extubated and is now awake and alert. She does not remember her arrest. Cardiac catheterization demonstrated mild non obstructive CAD and normal LV function. Echocardiogram demonstrated EF 55-60%, mild LVH, no RWMA. K was 3.7 on admission. She has not had prior syncope. There is no family history of sudden death, drowning, or syncope. She does have a  history of tachypalpitations for which she was placed on Metoprolol. She reports compliance with home medications and no recent fevers, chills, nausea, or diarrhea. She has not had chest pain or shortness of breath. She works at [employer] and does not smoke, drink, or use recreational drugs. EP has been asked to evaluate for treatment options.  Consultants:  Cardiology  Electrophysiology  Critical care    Procedures: *STUDIES:  7/31 Head CT - no acute findings 7/31 Neck CT - no acute findings  SIGNIFICANT EVENTS: 7/31 - Appeared to be having seizure like activity on admission. Neurology consulted. Movements stopped on their own.  8/02 - LHC: Reportedly no obstructing CAD  Antibiotics: Anti-infectives    None         HPI/Subjective: Extubated 8/4. Patient denies any cough or dyspnea, tolerating diet. Denies any nausea and abdominal pain, no headache or blurry vision   Objective: Filed Vitals:   03/31/15 0437 03/31/15 0444 03/31/15 1000 03/31/15 1125  BP: 123/80  131/83   Pulse: 99  94   Temp: 97.9 F (36.6 C)     TempSrc: Oral     Resp:      Height:      Weight: 101.696 kg (224 lb 3.2 oz)     SpO2: 98% 96%  97%    Intake/Output Summary (Last 24 hours) at 03/31/15 1231 Last data filed at 03/31/15 0600  Gross per 24 hour  Intake    740 ml  Output    775 ml  Net    -35 ml  Exam:  General: Awake. Alert. No distress. Sitting up in bed watching TV and eating breakfast. Integument: Warm & dry. No rash on exposed skin. Bruising right wrist unchanged. HEENT: Moist mucus membranes. No scleral icterus. PERRL. Cardiovascular: Regular rhythm. No edema. No appreciable JVD. Pulmonary: Mild crackles bilateral lung bases. Normal work of breathing on nasal cannula oxygen. Speaking in complete sentences. Abdomen: Soft. Normal bowel sounds. Nondistended.  Neurological: Follows commands. She is aware of the year, president, and season. She is also aware that  she is in hospital. She does not remember the events of her code. Musculoskeletal: Chest wall pain to palpation.  Data Review   Micro Results Recent Results (from the past 240 hour(s))  MRSA PCR Screening     Status: None   Collection Time: 03/25/15  8:44 PM  Result Value Ref Range Status   MRSA by PCR NEGATIVE NEGATIVE Final    Comment:        The GeneXpert MRSA Assay (FDA approved for NASAL specimens only), is one component of a comprehensive MRSA colonization surveillance program. It is not intended to diagnose MRSA infection nor to guide or monitor treatment for MRSA infections.     Radiology Reports Dg Abd 1 View  03/25/2015   CLINICAL DATA:  OG tube placement, cardiac arrest  EXAM: ABDOMEN - 1 VIEW  COMPARISON:  CT abdomen pelvis dated 12/30/2014  FINDINGS: Enteric tube is looped in the stomach and terminates in the gastric cardia.  Nonobstructive bowel gas pattern.  Defibrillator pads overlie the lower chest/upper abdomen.  IMPRESSION: Enteric tube is looped in the stomach and terminates in the gastric cardia.   Electronically Signed   By: Julian Hy M.D.   On: 03/25/2015 16:10   Ct Head Wo Contrast  03/25/2015   CLINICAL DATA:  Found on floor at work.  Unresponsive.  EXAM: CT HEAD WITHOUT CONTRAST  CT CERVICAL SPINE WITHOUT CONTRAST  TECHNIQUE: Multidetector CT imaging of the head and cervical spine was performed following the standard protocol without intravenous contrast. Multiplanar CT image reconstructions of the cervical spine were also generated.  COMPARISON:  10/27/2012  FINDINGS: CT HEAD FINDINGS  No acute intracranial abnormality. Specifically, no hemorrhage, hydrocephalus, mass lesion, acute infarction, or significant intracranial injury. No acute calvarial abnormality. Visualized paranasal sinuses and mastoids clear. Orbital soft tissues unremarkable.  CT CERVICAL SPINE FINDINGS  Motion artifact degrades image quality. No fracture visualized. No malalignment.  Prevertebral soft tissues are normal. No epidural or paraspinal hematoma.  IMPRESSION: No intracranial abnormality.  No acute bony abnormality in the cervical spine.   Electronically Signed   By: Rolm Baptise M.D.   On: 03/25/2015 15:06   Ct Cervical Spine Wo Contrast  03/25/2015   CLINICAL DATA:  Found on floor at work.  Unresponsive.  EXAM: CT HEAD WITHOUT CONTRAST  CT CERVICAL SPINE WITHOUT CONTRAST  TECHNIQUE: Multidetector CT imaging of the head and cervical spine was performed following the standard protocol without intravenous contrast. Multiplanar CT image reconstructions of the cervical spine were also generated.  COMPARISON:  10/27/2012  FINDINGS: CT HEAD FINDINGS  No acute intracranial abnormality. Specifically, no hemorrhage, hydrocephalus, mass lesion, acute infarction, or significant intracranial injury. No acute calvarial abnormality. Visualized paranasal sinuses and mastoids clear. Orbital soft tissues unremarkable.  CT CERVICAL SPINE FINDINGS  Motion artifact degrades image quality. No fracture visualized. No malalignment. Prevertebral soft tissues are normal. No epidural or paraspinal hematoma.  IMPRESSION: No intracranial abnormality.  No acute bony abnormality in the cervical spine.  Electronically Signed   By: Rolm Baptise M.D.   On: 03/25/2015 15:06   Dg Chest Port 1 View  03/25/2015   CLINICAL DATA:  Central line placement.  Hypothermia.  EXAM: PORTABLE CHEST - 1 VIEW  COMPARISON:  03/25/2015.  FINDINGS: Support apparatus: Endotracheal tube tip 1 cm from the carina. Enteric tube is present doubled back upon itself with the tip at the cardia of the stomach.  New LEFT subclavian central line is present. The tip is in the lower superior vena cava at the cavoatrial junction.  Cardiomediastinal Silhouette:  Unchanged.  Lungs: Opacity at the LEFT costophrenic angle, likely representing atelectasis. Airspace disease could also produce this appearance. No pneumothorax.  Effusions:  None.   Other:  None.  IMPRESSION: 1. Support apparatus as described above. The endotracheal tube has been retracted but remains 1 cm from the carina. 2. Uncomplicated interval placement of LEFT subclavian central line with the tip at the junction of the superior vena cava and RIGHT atrium. 3. Opacification at the LEFT costophrenic angle which may represent atelectasis or airspace disease.   Electronically Signed   By: Dereck Ligas M.D.   On: 03/25/2015 21:01   Dg Chest Port 1 View  03/25/2015   CLINICAL DATA:  Intubation, cardiac arrest.  EXAM: PORTABLE CHEST - 1 VIEW  COMPARISON:  01/23/2015  FINDINGS: Endotracheal tube has tip in the origin of the left mainstem bronchus. This could be pulled back approximately 3 cm. Nasogastric tube is coiled once over the stomach with tip and side-port over the stomach in the left upper quadrant.  Lungs are hypoinflated without focal consolidation or effusion. Cardiomediastinal silhouette is within normal. Remainder of the exam is unchanged.  IMPRESSION: Hypoinflation without acute cardiopulmonary disease.  Tubes and lines as described. Note that the endotracheal tube has tip over the origin of the left mainstem bronchus.  These results were called by telephone at the time of interpretation on 03/25/2015 at 4:03 pm to Dr. Thomasene Lot, who verbally acknowledged these results.   Electronically Signed   By: Marin Olp M.D.   On: 03/25/2015 16:03   Mr Card Morphology Wo/w Cm  03/30/2015   CLINICAL DATA:  49 year old female post cardiac arrest. Normal cardiac catheterization. Evaluate for myocarditis and ARVC.  EXAM: CARDIAC MRI  TECHNIQUE: The patient was scanned on a 1.5 Tesla GE magnet. A dedicated cardiac coil was used. Functional imaging was done using Fiesta sequences. 2,3, and 4 chamber views were done to assess for RWMA's. Modified Simpson's rule using a short axis stack was used to calculate an ejection fraction on a dedicated work Conservation officer, nature. The patient  received 32 cc of Multihance. After 10 minutes inversion recovery sequences were used to assess for infiltration and scar tissue.  CONTRAST:  32 cc  of Multihance  FINDINGS: 1. Normal left ventricular size, thickness and systolic function (LVEF = 65%). There is of the apical inferior wall.  LVEDD:  49 mm  LVESD:  31 mm  LVEDV:  111 ml  LVESV:  38 ml  SV:  72 ml  LV CO:  5.9 L/minute  Myocardial mass:  101 g  2. Normal right ventricular size, thickness and systolic function (LVEF = 63%). There are no regional wall motion abnormalities.  RVEDV:  114 ml  RVESV:  42 ml  SV:  72 ml  RV CO:  5.9 L/minute  3.  Mild left atrial dilatation.  4.  Mild mitral and mild tricuspid regurgitation.  5.  Normal size of the aorta and main pulmonary artery.  6. There is late subendocardial late gadolinium enhancement in the apical inferior wall.  IMPRESSION: 1. Normal left ventricular size, thickness and systolic function (LVEF = 65%) with hypokinesis of the apical inferior wall.  2. Normal right ventricular size, thickness and systolic function (LVEF = 63%) with no regional wall motion abnormalities.  3.  Mild left atrial dilatation.  4.  Mild mitral and mild tricuspid regurgitation.  5. A subendocardial late gadolinium enhancement in the apical inferior wall is consistent with prior subendocardial infarct.  6.  There is no evidence for ARVC.  Ena Dawley   Electronically Signed   By: Ena Dawley   On: 03/30/2015 18:34     CBC  Recent Labs Lab 03/25/15 1435  03/27/15 1615 03/28/15 0415 03/29/15 0330 03/30/15 0530 03/30/15 1400 03/31/15 0445  WBC 8.8  < > 15.7* 14.1* 14.4* 7.7  --  7.5  HGB 14.4  < > 12.2 11.8* 11.5* 9.5* 10.2* 10.0*  HCT 44.3  < > 36.9 36.2 35.4* 28.9* 31.6* 31.4*  PLT 283  < > 268 257 304 254  --  273  MCV 92.5  < > 92.0 92.3 93.2 93.2  --  93.2  MCH 30.2  < > 30.4 30.1 30.3 30.6  --  29.7  MCHC 32.6  < > 33.1 32.6 32.5 32.9  --  31.8  RDW 14.1  < > 14.3 14.5 14.6 14.7  --  14.6   LYMPHSABS 2.4  --   --   --   --   --   --   --   MONOABS 0.5  --   --   --   --   --   --   --   EOSABS 0.2  --   --   --   --   --   --   --   BASOSABS 0.1  --   --   --   --   --   --   --   < > = values in this interval not displayed.  Chemistries   Recent Labs Lab 03/25/15 1435  03/25/15 2140  03/27/15 0435  03/28/15 0415 03/29/15 0330 03/30/15 0515 03/30/15 0530 03/31/15 0445  NA 140  < > 139  < > 139  < > 141 141 142 143 139  K 3.7  < > 3.1*  < > 2.9*  < > 3.8 3.5 3.0* 3.0* 3.6  CL 110  < > 110  < > 108  < > 110 110 110 111 107  CO2 21*  < > 22  < > 22  < > 23 20* 26 26 26   GLUCOSE 193*  < > 108*  < > 127*  < > 102* 116* 144* 145* 100*  BUN 16  < > 16  < > 7  < > <5* <5* 5* 5* 7  CREATININE 0.99  < > 0.70  < > 0.74  < > 0.60 0.68 0.62 0.63 0.58  CALCIUM 8.5*  < > 7.8*  < > 7.9*  < > 8.1* 8.4* 7.8* 7.8* 8.1*  MG  --   < >  --   < > 2.0  --  2.1 2.2  --  2.0 1.8  AST 112*  --  113*  --  72*  --   --   --   --   --   --   ALT 86*  --  71*  --  61*  --   --   --   --   --   --   ALKPHOS 55  --  43  --  51  --   --   --   --   --   --   BILITOT 0.8  --  0.8  --  0.9  --   --   --   --   --   --   < > = values in this interval not displayed. ------------------------------------------------------------------------------------------------------------------ estimated creatinine clearance is 98.9 mL/min (by C-G formula based on Cr of 0.58). ------------------------------------------------------------------------------------------------------------------ No results for input(s): HGBA1C in the last 72 hours. ------------------------------------------------------------------------------------------------------------------  Recent Labs  03/28/15 2120  TRIG 93   ------------------------------------------------------------------------------------------------------------------ No results for input(s): TSH, T4TOTAL, T3FREE, THYROIDAB in the last 72 hours.  Invalid input(s):  FREET3 ------------------------------------------------------------------------------------------------------------------ No results for input(s): VITAMINB12, FOLATE, FERRITIN, TIBC, IRON, RETICCTPCT in the last 72 hours.  Coagulation profile  Recent Labs Lab 03/25/15 1917 03/26/15 1500  INR 1.02 1.13    No results for input(s): DDIMER in the last 72 hours.  Cardiac Enzymes  Recent Labs Lab 03/26/15 1520 03/26/15 1935 03/27/15 0155  TROPONINI 6.14* 4.67* 4.31*   ------------------------------------------------------------------------------------------------------------------ Invalid input(s): POCBNP   CBG:  Recent Labs Lab 03/29/15 2007 03/29/15 2344 03/30/15 0425 03/30/15 0756 03/30/15 1148  GLUCAP 140* 152* 145* 143* 123*       Studies: Mr Card Morphology Wo/w Cm  03/30/2015   CLINICAL DATA:  49 year old female post cardiac arrest. Normal cardiac catheterization. Evaluate for myocarditis and ARVC.  EXAM: CARDIAC MRI  TECHNIQUE: The patient was scanned on a 1.5 Tesla GE magnet. A dedicated cardiac coil was used. Functional imaging was done using Fiesta sequences. 2,3, and 4 chamber views were done to assess for RWMA's. Modified Simpson's rule using a short axis stack was used to calculate an ejection fraction on a dedicated work Conservation officer, nature. The patient received 32 cc of Multihance. After 10 minutes inversion recovery sequences were used to assess for infiltration and scar tissue.  CONTRAST:  32 cc  of Multihance  FINDINGS: 1. Normal left ventricular size, thickness and systolic function (LVEF = 65%). There is of the apical inferior wall.  LVEDD:  49 mm  LVESD:  31 mm  LVEDV:  111 ml  LVESV:  38 ml  SV:  72 ml  LV CO:  5.9 L/minute  Myocardial mass:  101 g  2. Normal right ventricular size, thickness and systolic function (LVEF = 63%). There are no regional wall motion abnormalities.  RVEDV:  114 ml  RVESV:  42 ml  SV:  72 ml  RV CO:  5.9 L/minute  3.   Mild left atrial dilatation.  4.  Mild mitral and mild tricuspid regurgitation.  5. Normal size of the aorta and main pulmonary artery.  6. There is late subendocardial late gadolinium enhancement in the apical inferior wall.  IMPRESSION: 1. Normal left ventricular size, thickness and systolic function (LVEF = 65%) with hypokinesis of the apical inferior wall.  2. Normal right ventricular size, thickness and systolic function (LVEF = 63%) with no regional wall motion abnormalities.  3.  Mild left atrial dilatation.  4.  Mild mitral and mild tricuspid regurgitation.  5. A subendocardial late gadolinium enhancement in the apical inferior wall is consistent with prior subendocardial infarct.  6.  There is no evidence for ARVC.  Ena Dawley   Electronically Signed   By:  Ena Dawley   On: 03/30/2015 18:34      No results found for: HGBA1C Lab Results  Component Value Date   CREATININE 0.58 03/31/2015       Scheduled Meds: . albuterol  2.5 mg Nebulization Q4H  . aspirin  81 mg Oral Daily  . atorvastatin  80 mg Oral q1800  . heparin  5,000 Units Subcutaneous 3 times per day  . levETIRAcetam  500 mg Intravenous Q12H  . metoprolol tartrate  12.5 mg Oral BID   Continuous Infusions:   Principal Problem:   Cardiac arrest Active Problems:   Status epilepticus   Seizure   Essential hypertension   Hyperlipemia    Time spent: 45 minutes   [facility] Pager [phone]. If 7PM-7AM, please contact night-coverage at [email or web address], password [facility] 03/31/2015, 12:31 PM  LOS: 6 days

## 2015-04-01 ENCOUNTER — Inpatient Hospital Stay (HOSPITAL_COMMUNITY): Payer: Medicaid Other

## 2015-04-01 LAB — CBC
HCT: 32.6 % — ABNORMAL LOW (ref 36.0–46.0)
HEMOGLOBIN: 10.5 g/dL — AB (ref 12.0–15.0)
MCH: 30 pg (ref 26.0–34.0)
MCHC: 32.2 g/dL (ref 30.0–36.0)
MCV: 93.1 fL (ref 78.0–100.0)
Platelets: 297 10*3/uL (ref 150–400)
RBC: 3.5 MIL/uL — ABNORMAL LOW (ref 3.87–5.11)
RDW: 14.4 % (ref 11.5–15.5)
WBC: 6.9 10*3/uL (ref 4.0–10.5)

## 2015-04-01 LAB — COMPREHENSIVE METABOLIC PANEL
ALBUMIN: 2.4 g/dL — AB (ref 3.5–5.0)
ALK PHOS: 63 U/L (ref 38–126)
ALT: 34 U/L (ref 14–54)
ANION GAP: 7 (ref 5–15)
AST: 37 U/L (ref 15–41)
BILIRUBIN TOTAL: 0.2 mg/dL — AB (ref 0.3–1.2)
BUN: 8 mg/dL (ref 6–20)
CALCIUM: 8.7 mg/dL — AB (ref 8.9–10.3)
CO2: 28 mmol/L (ref 22–32)
CREATININE: 0.52 mg/dL (ref 0.44–1.00)
Chloride: 106 mmol/L (ref 101–111)
GFR calc non Af Amer: >60 mL/min (ref >60)
GLUCOSE: 121 mg/dL — AB (ref 65–99)
Potassium: 3.7 mmol/L (ref 3.5–5.1)
SODIUM: 141 mmol/L (ref 135–145)
Total Protein: 5.4 g/dL — ABNORMAL LOW (ref 6.5–8.1)

## 2015-04-01 LAB — PHOSPHORUS: PHOSPHORUS: 4.3 mg/dL (ref 2.5–4.6)

## 2015-04-01 LAB — MAGNESIUM: Magnesium: 1.9 mg/dL (ref 1.7–2.4)

## 2015-04-01 LAB — GLUCOSE, CAPILLARY
GLUCOSE-CAPILLARY: 93 mg/dL (ref 65–99)
GLUCOSE-CAPILLARY: 97 mg/dL (ref 65–99)
Glucose-Capillary: 98 mg/dL (ref 65–99)
Glucose-Capillary: 95 mg/dL (ref 65–99)

## 2015-04-01 MED ORDER — SODIUM CHLORIDE 0.9 % IV SOLN
INTRAVENOUS | Status: DC
  Administered 2015-04-02: 07:00:00 via INTRAVENOUS

## 2015-04-01 MED ORDER — ALBUTEROL SULFATE (2.5 MG/3ML) 0.083% IN NEBU
2.5000 mg | INHALATION_SOLUTION | Freq: Two times a day (BID) | RESPIRATORY_TRACT | Status: DC
  Administered 2015-04-02 – 2015-04-06 (×7): 2.5 mg via RESPIRATORY_TRACT
  Filled 2015-04-01 (×8): qty 3

## 2015-04-01 MED ORDER — SALINE SPRAY 0.65 % NA SOLN
1.0000 | NASAL | Status: DC | PRN
  Filled 2015-04-01: qty 44

## 2015-04-01 MED ORDER — DEXTROSE 5 % IV SOLN: 2.0000 g | INTRAVENOUS | Status: DC

## 2015-04-01 MED ORDER — CHLORHEXIDINE GLUCONATE 4 % EX LIQD
CUTANEOUS | Status: AC
  Administered 2015-04-01: 20:00:00
  Filled 2015-04-01: qty 15

## 2015-04-01 MED ORDER — HYDROCODONE-HOMATROPINE 5-1.5 MG/5ML PO SYRP
5.0000 mL | ORAL_SOLUTION | ORAL | Status: DC | PRN
  Administered 2015-04-01: 5 mL via ORAL
  Filled 2015-04-01: qty 5

## 2015-04-01 MED ORDER — SODIUM CHLORIDE 0.9 % IR SOLN
80.0000 mg | Status: AC
  Filled 2015-04-01: qty 2

## 2015-04-01 MED ORDER — CHLORHEXIDINE GLUCONATE 4 % EX LIQD: 60.0000 mL | Freq: Once | CUTANEOUS | Status: DC

## 2015-04-01 MED ORDER — CEFAZOLIN SODIUM-DEXTROSE 2-3 GM-% IV SOLR
2.0000 g | INTRAVENOUS | Status: AC
  Filled 2015-04-01: qty 50

## 2015-04-01 MED ORDER — FLUTICASONE PROPIONATE 50 MCG/ACT NA SUSP
1.0000 | Freq: Every day | NASAL | Status: DC
  Administered 2015-04-01 – 2015-04-11 (×9): 1 via NASAL
  Filled 2015-04-01 (×2): qty 16

## 2015-04-01 MED ORDER — POTASSIUM CHLORIDE CRYS ER 20 MEQ PO TBCR
40.0000 meq | EXTENDED_RELEASE_TABLET | Freq: Once | ORAL | Status: AC
  Administered 2015-04-01: 40 meq via ORAL
  Filled 2015-04-01: qty 2

## 2015-04-01 NOTE — Progress Notes (Signed)
[facility] PROGRESS NOTE  Claire Mcdonald:[id number] DOB: August 06, 1966 DOA: 03/25/2015 PCP: Golden Pop, MD  Assessment/Plan: Principal Problem:   Cardiac arrest Active Problems:   Status epilepticus   Seizure   Essential hypertension   Hyperlipemia    Resuscitated VF arrest-patient has had a resuscitated arrest. Cardiac catheterization with non obstructive CAD and normal LV function. There is no family history of sudden death or syncope. Needs  further work up with cardiac MRI and SAEKG.  Flecanide challenge by  Dr Caryl Comes -  Keep K>3.9, Mg >1.8. No driving x6 months. Patient will ultimately need an ICD, for secondary prevention on MOnday Patient received 1 dose of leg at night by mouth 1 on 8/6  Cough, productive of green sputum, no difficulty swallowing Will check chest x-ray to rule out pneumonia, lungs clear to auscultation Continue Chloraseptic spray, will add Hycodan cough syrup  Endotracheal intubation for airway protection status post   cardiac arrest Now extubated, on room air  Dyslipidemia-continue statin  Hypertension-continue metoprolol  Hypokalemia keep potassium greater than 4.0  Status epilepticus-seizure disorder, seen by neurology, continue Keppra change IV to by mouth, EEG w/o seizure activity   Code Status:      Code Status Orders        Start     Ordered   03/27/15 1450  Full code   Continuous     03/27/15 1454     Family Communication: family updated about patient's clinical progress Disposition Plan:  As above    Brief narrative: 49 y.o. female with a past medical history of hypertension, hyperlipidemia, anxiety, depression, and obesity. She was at work on the day of admission when she collapsed. AED was applied and advised shock x2. Upon EMS arrival, she was intubated and transferred to [facility]. She was transferred to [facility] for further evaluation. She has been extubated and is now awake and alert. She does not remember her  arrest. Cardiac catheterization demonstrated mild non obstructive CAD and normal LV function. Echocardiogram demonstrated EF 55-60%, mild LVH, no RWMA. K was 3.7 on admission. She has not had prior syncope. There is no family history of sudden death, drowning, or syncope. She does have a history of tachypalpitations for which she was placed on Metoprolol. She reports compliance with home medications and no recent fevers, chills, nausea, or diarrhea. She has not had chest pain or shortness of breath. She works at [employer] and does not smoke, drink, or use recreational drugs. EP has been asked to evaluate for treatment options.  Consultants:  Cardiology  Electrophysiology  Critical care    Procedures: *STUDIES:  7/31 Head CT - no acute findings 7/31 Neck CT - no acute findings  SIGNIFICANT EVENTS: 7/31 - Appeared to be having seizure like activity on admission. Neurology consulted. Movements stopped on their own.  8/02 - LHC: Reportedly no obstructing CAD  Antibiotics: Anti-infectives    None         HPI/Subjective: Patient complaining of a cough productive of yellow-green sputum   Objective: Filed Vitals:   04/01/15 0437 04/01/15 0438 04/01/15 0740 04/01/15 1006  BP:  126/87  131/78  Pulse:    104  Temp:  98.6 F (37 C)    TempSrc:  Axillary    Resp:      Height:      Weight: 99.338 kg (219 lb)     SpO2:  97% 98%     Intake/Output Summary (Last 24 hours) at 04/01/15  [place] filed at 04/01/15 0756  Gross per 24 hour  Intake    400 ml  Output   2950 ml  Net  -2550 ml    Exam:  General: Awake. Alert. No distress. Sitting up in bed watching TV and eating breakfast. Integument: Warm & dry. No rash on exposed skin. Bruising right wrist unchanged. HEENT: Moist mucus membranes. No scleral icterus. PERRL. Cardiovascular: Regular rhythm. No edema. No appreciable JVD. Pulmonary: Mild crackles bilateral lung bases. Normal work of breathing on  nasal cannula oxygen. Speaking in complete sentences. Abdomen: Soft. Normal bowel sounds. Nondistended.  Neurological: Follows commands. She is aware of the year, president, and season. She is also aware that she is in hospital. She does not remember the events of her code. Musculoskeletal: Chest wall pain to palpation.  Data Review   Micro Results Recent Results (from the past 240 hour(s))  MRSA PCR Screening     Status: None   Collection Time: 03/25/15  8:44 PM  Result Value Ref Range Status   MRSA by PCR NEGATIVE NEGATIVE Final    Comment:        The GeneXpert MRSA Assay (FDA approved for NASAL specimens only), is one component of a comprehensive MRSA colonization surveillance program. It is not intended to diagnose MRSA infection nor to guide or monitor treatment for MRSA infections.     Radiology Reports Dg Abd 1 View  03/25/2015   CLINICAL DATA:  OG tube placement, cardiac arrest  EXAM: ABDOMEN - 1 VIEW  COMPARISON:  CT abdomen pelvis dated 12/30/2014  FINDINGS: Enteric tube is looped in the stomach and terminates in the gastric cardia.  Nonobstructive bowel gas pattern.  Defibrillator pads overlie the lower chest/upper abdomen.  IMPRESSION: Enteric tube is looped in the stomach and terminates in the gastric cardia.   Electronically Signed   By: Julian Hy M.D.   On: 03/25/2015 16:10   Ct Head Wo Contrast  03/25/2015   CLINICAL DATA:  Found on floor at work.  Unresponsive.  EXAM: CT HEAD WITHOUT CONTRAST  CT CERVICAL SPINE WITHOUT CONTRAST  TECHNIQUE: Multidetector CT imaging of the head and cervical spine was performed following the standard protocol without intravenous contrast. Multiplanar CT image reconstructions of the cervical spine were also generated.  COMPARISON:  10/27/2012  FINDINGS: CT HEAD FINDINGS  No acute intracranial abnormality. Specifically, no hemorrhage, hydrocephalus, mass lesion, acute infarction, or significant intracranial injury. No acute  calvarial abnormality. Visualized paranasal sinuses and mastoids clear. Orbital soft tissues unremarkable.  CT CERVICAL SPINE FINDINGS  Motion artifact degrades image quality. No fracture visualized. No malalignment. Prevertebral soft tissues are normal. No epidural or paraspinal hematoma.  IMPRESSION: No intracranial abnormality.  No acute bony abnormality in the cervical spine.   Electronically Signed   By: Rolm Baptise M.D.   On: 03/25/2015 15:06   Ct Cervical Spine Wo Contrast  03/25/2015   CLINICAL DATA:  Found on floor at work.  Unresponsive.  EXAM: CT HEAD WITHOUT CONTRAST  CT CERVICAL SPINE WITHOUT CONTRAST  TECHNIQUE: Multidetector CT imaging of the head and cervical spine was performed following the standard protocol without intravenous contrast. Multiplanar CT image reconstructions of the cervical spine were also generated.  COMPARISON:  10/27/2012  FINDINGS: CT HEAD FINDINGS  No acute intracranial abnormality. Specifically, no hemorrhage, hydrocephalus, mass lesion, acute infarction, or significant intracranial injury. No acute calvarial abnormality. Visualized paranasal sinuses and mastoids clear. Orbital soft tissues unremarkable.  CT CERVICAL SPINE FINDINGS  Motion  artifact degrades image quality. No fracture visualized. No malalignment. Prevertebral soft tissues are normal. No epidural or paraspinal hematoma.  IMPRESSION: No intracranial abnormality.  No acute bony abnormality in the cervical spine.   Electronically Signed   By: Rolm Baptise M.D.   On: 03/25/2015 15:06   Dg Chest Port 1 View  03/25/2015   CLINICAL DATA:  Central line placement.  Hypothermia.  EXAM: PORTABLE CHEST - 1 VIEW  COMPARISON:  03/25/2015.  FINDINGS: Support apparatus: Endotracheal tube tip 1 cm from the carina. Enteric tube is present doubled back upon itself with the tip at the cardia of the stomach.  New LEFT subclavian central line is present. The tip is in the lower superior vena cava at the cavoatrial junction.   Cardiomediastinal Silhouette:  Unchanged.  Lungs: Opacity at the LEFT costophrenic angle, likely representing atelectasis. Airspace disease could also produce this appearance. No pneumothorax.  Effusions:  None.  Other:  None.  IMPRESSION: 1. Support apparatus as described above. The endotracheal tube has been retracted but remains 1 cm from the carina. 2. Uncomplicated interval placement of LEFT subclavian central line with the tip at the junction of the superior vena cava and RIGHT atrium. 3. Opacification at the LEFT costophrenic angle which may represent atelectasis or airspace disease.   Electronically Signed   By: Dereck Ligas M.D.   On: 03/25/2015 21:01   Dg Chest Port 1 View  03/25/2015   CLINICAL DATA:  Intubation, cardiac arrest.  EXAM: PORTABLE CHEST - 1 VIEW  COMPARISON:  01/23/2015  FINDINGS: Endotracheal tube has tip in the origin of the left mainstem bronchus. This could be pulled back approximately 3 cm. Nasogastric tube is coiled once over the stomach with tip and side-port over the stomach in the left upper quadrant.  Lungs are hypoinflated without focal consolidation or effusion. Cardiomediastinal silhouette is within normal. Remainder of the exam is unchanged.  IMPRESSION: Hypoinflation without acute cardiopulmonary disease.  Tubes and lines as described. Note that the endotracheal tube has tip over the origin of the left mainstem bronchus.  These results were called by telephone at the time of interpretation on 03/25/2015 at 4:03 pm to Dr. Thomasene Lot, who verbally acknowledged these results.   Electronically Signed   By: Marin Olp M.D.   On: 03/25/2015 16:03   Mr Card Morphology Wo/w Cm  03/30/2015   CLINICAL DATA:  49 year old female post cardiac arrest. Normal cardiac catheterization. Evaluate for myocarditis and ARVC.  EXAM: CARDIAC MRI  TECHNIQUE: The patient was scanned on a 1.5 Tesla GE magnet. A dedicated cardiac coil was used. Functional imaging was done using Fiesta sequences.  2,3, and 4 chamber views were done to assess for RWMA's. Modified Simpson's rule using a short axis stack was used to calculate an ejection fraction on a dedicated work Conservation officer, nature. The patient received 32 cc of Multihance. After 10 minutes inversion recovery sequences were used to assess for infiltration and scar tissue.  CONTRAST:  32 cc  of Multihance  FINDINGS: 1. Normal left ventricular size, thickness and systolic function (LVEF = 65%). There is of the apical inferior wall.  LVEDD:  49 mm  LVESD:  31 mm  LVEDV:  111 ml  LVESV:  38 ml  SV:  72 ml  LV CO:  5.9 L/minute  Myocardial mass:  101 g  2. Normal right ventricular size, thickness and systolic function (LVEF = 63%). There are no regional wall motion abnormalities.  RVEDV:  114  ml  RVESV:  42 ml  SV:  72 ml  RV CO:  5.9 L/minute  3.  Mild left atrial dilatation.  4.  Mild mitral and mild tricuspid regurgitation.  5. Normal size of the aorta and main pulmonary artery.  6. There is late subendocardial late gadolinium enhancement in the apical inferior wall.  IMPRESSION: 1. Normal left ventricular size, thickness and systolic function (LVEF = 65%) with hypokinesis of the apical inferior wall.  2. Normal right ventricular size, thickness and systolic function (LVEF = 63%) with no regional wall motion abnormalities.  3.  Mild left atrial dilatation.  4.  Mild mitral and mild tricuspid regurgitation.  5. A subendocardial late gadolinium enhancement in the apical inferior wall is consistent with prior subendocardial infarct.  6.  There is no evidence for ARVC.  Ena Dawley   Electronically Signed   By: Ena Dawley   On: 03/30/2015 18:34     CBC  Recent Labs Lab 03/25/15 1435  03/28/15 0415 03/29/15 0330 03/30/15 0530 03/30/15 1400 03/31/15 0445 04/01/15 0339  WBC 8.8  < > 14.1* 14.4* 7.7  --  7.5 6.9  HGB 14.4  < > 11.8* 11.5* 9.5* 10.2* 10.0* 10.5*  HCT 44.3  < > 36.2 35.4* 28.9* 31.6* 31.4* 32.6*  PLT 283  < > 257 304  254  --  273 297  MCV 92.5  < > 92.3 93.2 93.2  --  93.2 93.1  MCH 30.2  < > 30.1 30.3 30.6  --  29.7 30.0  MCHC 32.6  < > 32.6 32.5 32.9  --  31.8 32.2  RDW 14.1  < > 14.5 14.6 14.7  --  14.6 14.4  LYMPHSABS 2.4  --   --   --   --   --   --   --   MONOABS 0.5  --   --   --   --   --   --   --   EOSABS 0.2  --   --   --   --   --   --   --   BASOSABS 0.1  --   --   --   --   --   --   --   < > = values in this interval not displayed.  Chemistries   Recent Labs Lab 03/25/15 1435  03/25/15 2140  03/27/15 0435  03/28/15 0415 03/29/15 0330 03/30/15 0515 03/30/15 0530 03/31/15 0445 04/01/15 0339  NA 140  < > 139  < > 139  < > 141 141 142 143 139 141  K 3.7  < > 3.1*  < > 2.9*  < > 3.8 3.5 3.0* 3.0* 3.6 3.7  CL 110  < > 110  < > 108  < > 110 110 110 111 107 106  CO2 21*  < > 22  < > 22  < > 23 20* 26 26 26 28   GLUCOSE 193*  < > 108*  < > 127*  < > 102* 116* 144* 145* 100* 121*  BUN 16  < > 16  < > 7  < > <5* <5* 5* 5* 7 8  CREATININE 0.99  < > 0.70  < > 0.74  < > 0.60 0.68 0.62 0.63 0.58 0.52  CALCIUM 8.5*  < > 7.8*  < > 7.9*  < > 8.1* 8.4* 7.8* 7.8* 8.1* 8.7*  MG  --   < >  --   < >  2.0  --  2.1 2.2  --  2.0 1.8 1.9  AST 112*  --  113*  --  72*  --   --   --   --   --   --  37  ALT 86*  --  71*  --  61*  --   --   --   --   --   --  34  ALKPHOS 55  --  43  --  51  --   --   --   --   --   --  63  BILITOT 0.8  --  0.8  --  0.9  --   --   --   --   --   --  0.2*  < > = values in this interval not displayed. ------------------------------------------------------------------------------------------------------------------ estimated creatinine clearance is 97.7 mL/min (by C-G formula based on Cr of 0.52). ------------------------------------------------------------------------------------------------------------------ No results for input(s): HGBA1C in the last 72  hours. ------------------------------------------------------------------------------------------------------------------ No results for input(s): CHOL, HDL, LDLCALC, TRIG, CHOLHDL, LDLDIRECT in the last 72 hours. ------------------------------------------------------------------------------------------------------------------ No results for input(s): TSH, T4TOTAL, T3FREE, THYROIDAB in the last 72 hours.  Invalid input(s): FREET3 ------------------------------------------------------------------------------------------------------------------ No results for input(s): VITAMINB12, FOLATE, FERRITIN, TIBC, IRON, RETICCTPCT in the last 72 hours.  Coagulation profile  Recent Labs Lab 03/25/15 1917 03/26/15 1500  INR 1.02 1.13    No results for input(s): DDIMER in the last 72 hours.  Cardiac Enzymes  Recent Labs Lab 03/26/15 1520 03/26/15 1935 03/27/15 0155  TROPONINI 6.14* 4.67* 4.31*   ------------------------------------------------------------------------------------------------------------------ Invalid input(s): POCBNP   CBG:  Recent Labs Lab 03/30/15 0756 03/30/15 1148 03/31/15 1609 03/31/15 2003 04/01/15 0743  GLUCAP 143* 123* 102* 104* 93       Studies: Mr Card Morphology Wo/w Cm  03/30/2015   CLINICAL DATA:  49 year old female post cardiac arrest. Normal cardiac catheterization. Evaluate for myocarditis and ARVC.  EXAM: CARDIAC MRI  TECHNIQUE: The patient was scanned on a 1.5 Tesla GE magnet. A dedicated cardiac coil was used. Functional imaging was done using Fiesta sequences. 2,3, and 4 chamber views were done to assess for RWMA's. Modified Simpson's rule using a short axis stack was used to calculate an ejection fraction on a dedicated work Conservation officer, nature. The patient received 32 cc of Multihance. After 10 minutes inversion recovery sequences were used to assess for infiltration and scar tissue.  CONTRAST:  32 cc  of Multihance  FINDINGS: 1.  Normal left ventricular size, thickness and systolic function (LVEF = 65%). There is of the apical inferior wall.  LVEDD:  49 mm  LVESD:  31 mm  LVEDV:  111 ml  LVESV:  38 ml  SV:  72 ml  LV CO:  5.9 L/minute  Myocardial mass:  101 g  2. Normal right ventricular size, thickness and systolic function (LVEF = 63%). There are no regional wall motion abnormalities.  RVEDV:  114 ml  RVESV:  42 ml  SV:  72 ml  RV CO:  5.9 L/minute  3.  Mild left atrial dilatation.  4.  Mild mitral and mild tricuspid regurgitation.  5. Normal size of the aorta and main pulmonary artery.  6. There is late subendocardial late gadolinium enhancement in the apical inferior wall.  IMPRESSION: 1. Normal left ventricular size, thickness and systolic function (LVEF = 65%) with hypokinesis of the apical inferior wall.  2. Normal right ventricular size, thickness and systolic function (LVEF = 63%) with no regional wall  motion abnormalities.  3.  Mild left atrial dilatation.  4.  Mild mitral and mild tricuspid regurgitation.  5. A subendocardial late gadolinium enhancement in the apical inferior wall is consistent with prior subendocardial infarct.  6.  There is no evidence for ARVC.  Ena Dawley   Electronically Signed   By: Ena Dawley   On: 03/30/2015 18:34      No results found for: HGBA1C Lab Results  Component Value Date   CREATININE 0.52 04/01/2015       Scheduled Meds: . albuterol  2.5 mg Nebulization Q4H  . aspirin  81 mg Oral Daily  . atorvastatin  80 mg Oral q1800  . heparin  5,000 Units Subcutaneous 3 times per day  . levETIRAcetam  500 mg Oral BID  . magnesium oxide  400 mg Oral Daily  . metoprolol tartrate  12.5 mg Oral BID   Continuous Infusions:   Principal Problem:   Cardiac arrest Active Problems:   Status epilepticus   Seizure   Essential hypertension   Hyperlipemia    Time spent: 45 minutes   [facility] Pager [phone]. If 7PM-7AM, please contact night-coverage  at [email or web address], password [facility] 04/01/2015, 10:34 AM  LOS: 7 days

## 2015-04-01 NOTE — Progress Notes (Signed)
Subjective:  Patient is hoarse and complains of chest wall soreness as well as urinary frequency.  MRI mildly positive.  Dr. Caryl Comes and is planning to place an ICD tomorrow.  Unclear whether this is subcutaneous or intravenous.  Objective:  Vital Signs in the last 24 hours: BP 131/78 mmHg  Pulse 104  Temp(Src) 98.6 F (37 C) (Axillary)  Resp 20  Ht 5\' 3"  (1.6 m)  Wt 99.338 kg (219 lb)  BMI 38.80 kg/m2  SpO2 98%  LMP 03/06/2015  Physical Exam:  Lungs:  Clear  Cardiac:  Regular rhythm, normal S1 and S2, no S3 Abdomen:  Soft, nontender, no masses Extremities:  No edema present  Intake/Output from previous day: 08/06 0701 - 08/07 0700 In: 400 [P.O.:400] Out: 2450 [Urine:2450] Weight Filed Weights   03/30/15 0427 03/31/15 0437 04/01/15 0437  Weight: 103.7 kg (228 lb 9.9 oz) 101.696 kg (224 lb 3.2 oz) 99.338 kg (219 lb)    Lab Results: Basic Metabolic Panel:  Recent Labs  03/31/15 0445 04/01/15 0339  NA 139 141  K 3.6 3.7  CL 107 106  CO2 26 28  GLUCOSE 100* 121*  BUN 7 8  CREATININE 0.58 0.52    CBC:  Recent Labs  03/31/15 0445 04/01/15 0339  WBC 7.5 6.9  HGB 10.0* 10.5*  HCT 31.4* 32.6*  MCV 93.2 93.1  PLT 273 297    BNP    Component Value Date/Time   BNP 146.1* 03/25/2015 2140    PROTIME: Lab Results  Component Value Date   INR 1.13 03/26/2015   INR 1.02 03/25/2015    Telemetry: Sinus rhythm, no ventricular arrhythmias  Assessment/Plan:  1.  Out of hospital cardiac arrest with normal LV function-results of flecainide testing unclear from chart  Recommendations:  Keep nothing by mouth for possible ICD implantation tomorrow.  Patient doesn't have a very great understanding of this so we'll need to talk to EP prior to this being done.      Kerry Hough  MD [facility]  04/01/2015, 11:21 AM

## 2015-04-02 ENCOUNTER — Encounter (HOSPITAL_COMMUNITY): Payer: Self-pay | Admitting: Radiology

## 2015-04-02 ENCOUNTER — Inpatient Hospital Stay (HOSPITAL_COMMUNITY): Payer: Medicaid Other

## 2015-04-02 DIAGNOSIS — R05 Cough: Secondary | ICD-10-CM | POA: Insufficient documentation

## 2015-04-02 DIAGNOSIS — R0600 Dyspnea, unspecified: Secondary | ICD-10-CM

## 2015-04-02 DIAGNOSIS — I2699 Other pulmonary embolism without acute cor pulmonale: Secondary | ICD-10-CM

## 2015-04-02 DIAGNOSIS — R059 Cough, unspecified: Secondary | ICD-10-CM | POA: Insufficient documentation

## 2015-04-02 LAB — RENAL FUNCTION PANEL
ANION GAP: 9 (ref 5–15)
Albumin: 2.6 g/dL — ABNORMAL LOW (ref 3.5–5.0)
BUN: 7 mg/dL (ref 6–20)
CO2: 28 mmol/L (ref 22–32)
Calcium: 8.7 mg/dL — ABNORMAL LOW (ref 8.9–10.3)
Chloride: 101 mmol/L (ref 101–111)
Creatinine, Ser: 0.5 mg/dL (ref 0.44–1.00)
GFR calc non Af Amer: >60 mL/min (ref >60)
Glucose, Bld: 106 mg/dL — ABNORMAL HIGH (ref 65–99)
POTASSIUM: 3.8 mmol/L (ref 3.5–5.1)
Phosphorus: 4.6 mg/dL (ref 2.5–4.6)
Sodium: 138 mmol/L (ref 135–145)

## 2015-04-02 LAB — CBC
HCT: 35.4 % — ABNORMAL LOW (ref 36.0–46.0)
HEMOGLOBIN: 11.5 g/dL — AB (ref 12.0–15.0)
MCH: 30.3 pg (ref 26.0–34.0)
MCHC: 32.5 g/dL (ref 30.0–36.0)
MCV: 93.4 fL (ref 78.0–100.0)
PLATELETS: 300 10*3/uL (ref 150–400)
RBC: 3.79 MIL/uL — AB (ref 3.87–5.11)
RDW: 14.6 % (ref 11.5–15.5)
WBC: 7.5 10*3/uL (ref 4.0–10.5)

## 2015-04-02 LAB — URINE MICROSCOPIC-ADD ON

## 2015-04-02 LAB — URINALYSIS, ROUTINE W REFLEX MICROSCOPIC
Bilirubin Urine: NEGATIVE
GLUCOSE, UA: NEGATIVE mg/dL
Ketones, ur: NEGATIVE mg/dL
NITRITE: NEGATIVE
PROTEIN: 100 mg/dL — AB
Specific Gravity, Urine: 1.019 (ref 1.005–1.030)
Urobilinogen, UA: 0.2 mg/dL (ref 0.0–1.0)
pH: 6 (ref 5.0–8.0)

## 2015-04-02 LAB — GLUCOSE, CAPILLARY
GLUCOSE-CAPILLARY: 124 mg/dL — AB (ref 65–99)
Glucose-Capillary: 88 mg/dL (ref 65–99)
Glucose-Capillary: 116 mg/dL — ABNORMAL HIGH (ref 65–99)
Glucose-Capillary: 95 mg/dL (ref 65–99)

## 2015-04-02 LAB — MAGNESIUM: Magnesium: 2.1 mg/dL (ref 1.7–2.4)

## 2015-04-02 LAB — D-DIMER, QUANTITATIVE (NOT AT ARMC): D DIMER QUANT: 2.77 ug{FEU}/mL — AB (ref 0.00–0.48)

## 2015-04-02 MED ORDER — HEPARIN BOLUS VIA INFUSION
5000.0000 [IU] | Freq: Once | INTRAVENOUS | Status: DC
  Filled 2015-04-02: qty 5000

## 2015-04-02 MED ORDER — HEPARIN (PORCINE) IN NACL 100-0.45 UNIT/ML-% IJ SOLN
1200.0000 [IU]/h | INTRAMUSCULAR | Status: DC
  Administered 2015-04-02 – 2015-04-04 (×3): 1200 [IU]/h via INTRAVENOUS
  Filled 2015-04-02 (×3): qty 250

## 2015-04-02 MED ORDER — IOHEXOL 350 MG/ML SOLN
75.0000 mL | Freq: Once | INTRAVENOUS | Status: AC | PRN
  Administered 2015-04-02: 75 mL via INTRAVENOUS

## 2015-04-02 MED ORDER — FUROSEMIDE 10 MG/ML IJ SOLN
40.0000 mg | Freq: Once | INTRAMUSCULAR | Status: DC
  Filled 2015-04-02: qty 4

## 2015-04-02 NOTE — Progress Notes (Signed)
Pt c/o of burning sensation when she voided. Will send specimen per protocol.

## 2015-04-02 NOTE — Progress Notes (Signed)
Physical Therapy Treatment Patient Details Name: Claire Mcdonald MRN: [id number] DOB: Jun 13, 1966 Today's Date: 04/02/2015    History of Present Illness Ms. Deshazo is 49 y/o woman with past medical history of hypertension, chronic pain and depression who is on gabapentin and prn topomax. She was working on evening of admit when her coworkers called EMS to report seizure like activity.She was shocked twice and a [place] airway placed. She was noted to again have seizure like activity in the ambulance and in the ED at [facility]. She was extubated on 8/4.    PT Comments    Patient found using bedside commode, agreeable to participate in PT today and eager to get walking. Please see Vitals for patient HR response to sit to stand transfer. NP from [place] consulted patient during session as well and wanted to see her ambulatory ability to the doorway. Patient was eager to continue, but was limited due to increased HR, and had some DOE. Patient was able to participate in exercises seated EOB as described below. Patient will benefit from PT to address gait speed as well as endurance decreases once HR response and DOE are more controlled.  Follow Up Recommendations  Home health PT;Supervision/Assistance - 24 hour     Equipment Recommendations  Rolling walker with 5" wheels    Recommendations for Other Services       Precautions / Restrictions Restrictions Weight Bearing Restrictions: No    Mobility  Bed Mobility Overal bed mobility:  (Pt using bedside commode.)                Transfers Overall transfer level: Needs assistance Equipment used: Rolling walker (2 wheeled) Transfers: Sit to/from Stand Sit to Stand: Min guard         General transfer comment:  (Requires monitoring of vital signs, especially HR)  Ambulation/Gait Ambulation/Gait assistance: Min guard Ambulation Distance (Feet): 8 Feet Assistive device: Rolling walker (2 wheeled) Gait Pattern/deviations:  Decreased stride length;Shuffle;Trunk flexed;Narrow base of support   Gait velocity interpretation: <1.8 ft/sec, indicative of risk for recurrent falls General Gait Details: NP from Pulmonary wished to see her response to walking to the door. See Vitals section for HR details. Patient is eager to walk.   Stairs            Wheelchair Mobility    Modified Rankin (Stroke Patients Only)       Balance Overall balance assessment: Needs assistance Sitting-balance support: Feet supported;Bilateral upper extremity supported Sitting balance-Leahy Scale: Good     Standing balance support: Bilateral upper extremity supported Standing balance-Leahy Scale: Fair Standing balance comment: At times not sure if patient losing balance or moving purposefully. Seemed more unsteady on her feet than last session.                    Cognition Arousal/Alertness: Awake/alert Behavior During Therapy: WFL for tasks assessed/performed Overall Cognitive Status: Within Functional Limits for tasks assessed                      Exercises General Exercises - Lower Extremity Long Arc Quad: AROM;Both;10 reps;Seated Hip Flexion/Marching: AROM;Both;5 reps;Seated (Reps limited due to HR response.) Toe Raises: AROM;Both;10 reps;Seated Heel Raises: AROM;Both;10 reps;Seated    General Comments        Pertinent Vitals/Pain Pain Assessment: 0-10 Pain Score: 8  Pain Location: pain with urination Pain Descriptors / Indicators: Burning Pain Intervention(s): Other (comment) (made RN aware of pain)  HR during initial sit to  stand from bedside commode = 120 - 125 bpm HR range during ambulation = 125 - 135 bpm HR range during seated exercises = 117 - 122 bpm    Home Living                      Prior Function            PT Goals (current goals can now be found in the care plan section) Acute Rehab PT Goals Patient Stated Goal: to return home and get stronger PT Goal  Formulation: With patient Time For Goal Achievement: 04/13/15 Potential to Achieve Goals: Good Progress towards PT goals: Progressing toward goals    Frequency  Min 3X/week    PT Plan Current plan remains appropriate    Co-evaluation             End of Session   Activity Tolerance: Treatment limited secondary to medical complications (Comment) (Increased HR response to ambulation, see Vitals) Patient left: in bed;with call bell/phone within reach     Time: 1022-1055 PT Time Calculation (min) (ACUTE ONLY): 33 min  Charges:                       G CodesRoanna Epley, SPT [phone]  04/02/2015, 12:30 PM

## 2015-04-02 NOTE — Progress Notes (Signed)
ANTICOAGULATION CONSULT NOTE - Initial Consult  Pharmacy Consult for Heparin Indication: DVT  Allergies  Allergen Reactions  . Toradol [Ketorolac Tromethamine] Itching and Rash  . Tramadol Itching and Rash  . Zofran [Ondansetron Hcl] Itching and Rash    Patient Measurements: Height: 5\' 3"  (160 cm) Weight: 212 lb 6.4 oz (96.344 kg) IBW/kg (Calculated) : 52.4 Heparin Dosing Weight: 75 kg  Vital Signs: Temp: 97.8 F (36.6 C) (08/08 0500) Temp Source: Oral (08/08 0500) BP: 107/64 mmHg (08/08 1109) Pulse Rate: 120 (08/08 1109)  Labs:  Recent Labs  03/31/15 0445 04/01/15 0339 04/02/15 0415  HGB 10.0* 10.5* 11.5*  HCT 31.4* 32.6* 35.4*  PLT 273 297 300  CREATININE 0.58 0.52 0.50    Estimated Creatinine Clearance: 96.1 mL/min (by C-G formula based on Cr of 0.5).   Medical History: Past Medical History  Diagnosis Date  . Hypertension   . High cholesterol   . Anxiety   . Depression   . Allergy   . GERD (gastroesophageal reflux disease)   . Migraine   . Diverticulosis     seen on CT 2015  . Obesity (BMI 30-39.9)     Medications:  Prescriptions prior to admission  Medication Sig Dispense Refill Last Dose  . DULoxetine (CYMBALTA) 60 MG capsule Take 60 mg by mouth daily.   unknown  . furosemide (LASIX) 20 MG tablet Take 20 mg by mouth daily.   unknown  . metoprolol succinate (TOPROL-XL) 100 MG 24 hr tablet Take 100 mg by mouth daily. Take with or immediately following a meal.   03/25/2015 at 0800  . omeprazole (PRILOSEC) 20 MG capsule Take 20 mg by mouth daily.   03/25/2015  . albuterol (PROVENTIL HFA;VENTOLIN HFA) 108 (90 BASE) MCG/ACT inhaler Inhale 2 puffs into the lungs every 6 (six) hours as needed for wheezing or shortness of breath. (Patient not taking: Reported on 03/12/2015) 1 Inhaler 0 Not Taking at Unknown time  . amoxicillin (AMOXIL) 875 MG tablet Take 1 tablet (875 mg total) by mouth 2 (two) times daily. (Patient not taking: Reported on 03/12/2015) 20 tablet  0 Not Taking at Unknown time  . fluticasone (FLONASE) 50 MCG/ACT nasal spray Place 1 spray into both nostrils daily. (Patient not taking: Reported on 03/26/2015) 16 g 0 Not Taking at Unknown time  . gabapentin (NEURONTIN) 300 MG capsule Take 300 mg by mouth as needed.    unknown  . HYDROcodone-acetaminophen (NORCO/VICODIN) 5-325 MG per tablet Take 2 tablets by mouth every 4 (four) hours as needed for pain. 10 tablet 0   . metoCLOPramide (REGLAN) 5 MG tablet Take 1 tablet (5 mg total) by mouth every 6 (six) hours as needed for nausea. (Patient not taking: Reported on 03/12/2015) 10 tablet 0 Not Taking at Unknown time  . naproxen (NAPROSYN) 500 MG tablet Take 1 tablet (500 mg total) by mouth 2 (two) times daily with a meal. (Patient not taking: Reported on 03/12/2015) 20 tablet 0 Not Taking at Unknown time  . promethazine (PHENERGAN) 25 MG tablet Take 1 tablet (25 mg total) by mouth every 6 (six) hours as needed for nausea. 20 tablet 0   . topiramate (TOPAMAX) 100 MG tablet Take 100 mg by mouth 2 (two) times daily.   03/25/2015   Scheduled:  . albuterol  2.5 mg Nebulization BID  . aspirin  81 mg Oral Daily  . atorvastatin  80 mg Oral q1800  .  ceFAZolin (ANCEF) IV  2 g Intravenous To Cath  . chlorhexidine  60 mL Topical Once  . chlorhexidine  60 mL Topical Once  . fluticasone  1 spray Each Nare Daily  . gentamicin irrigation  80 mg Irrigation To Cath  . levETIRAcetam  500 mg Oral BID  . magnesium oxide  400 mg Oral Daily  . metoprolol tartrate  12.5 mg Oral BID   Infusions:  . sodium chloride 50 mL/hr at 04/02/15 0635  . sodium chloride 50 mL/hr at 04/02/15 0636    Assessment: 49yo female presented with SOB and is found to have DVT in RLE. Pharmacy is consulted to dose heparin for VTE treatment. Hgb 11.5, Plt 300, sCr 0.5, D-dimer 2.77.  Goal of Therapy:  Heparin level 0.3-0.7 units/ml Monitor platelets by anticoagulation protocol: Yes   Plan:  Give 5000 units bolus x 1 Start heparin  infusion at 1200 units/hr Check anti-Xa level in 6 hours and daily while on heparin Continue to monitor H&H and platelets  Andrey Cota. Diona Foley, PharmD Clinical Pharmacist Pager [phone] Rebecka Apley 04/02/2015,4:04 PM

## 2015-04-02 NOTE — Progress Notes (Addendum)
[facility] PROGRESS NOTE  Claire Mcdonald GDJ:[id number] DOB: [birth year] DOA: 03/25/2015 PCP: Golden Pop, MD  Assessment/Plan: Principal Problem:   Cardiac arrest Active Problems:   Status epilepticus   Seizure   Essential hypertension   Hyperlipemia    Resuscitated VF arrest-patient has had a resuscitated arrest. Cardiac catheterization with non obstructive CAD and normal LV function. There is no family history of sudden death or syncope. Needs  further work up with cardiac MRI and SAEKG.  Flecanide challenge by  Dr Caryl Comes -  Keep K>3.9, Mg >1.8. No driving x6 months. Patient will ultimately need an ICD, for secondary prevention Pt very short of breath Not ready for implant per Dr Caryl Comes Will give one dose of IV diuretic and see if this clears   Cough, productive of green sputum, no difficulty swallowing Will check chest x-ray to rule out pneumonia, lungs clear to auscultation Continue Chloraseptic spray, will add Hycodan cough syrup, flonase and ocean nasal spray  Pulmonary consulted concern for PE, d-dimer elevated, CT PE protocol negative, patient does have DVT in the right lower extremity, start patient on a heparin drip in anticipation of procedure by cardiology.  Endotracheal intubation for airway protection status post   cardiac arrest Now extubated, on room air  Dyslipidemia-continue statin  Hypertension-continue metoprolol  Hypokalemia keep potassium greater than 4.0  Status epilepticus-seizure disorder, seen by neurology, continue Keppra change IV to by mouth, EEG w/o seizure activity   Code Status:      Code Status Orders        Start     Ordered   03/27/15 1450  Full code   Continuous     03/27/15 1454     Family Communication: family updated about patient's clinical progress Disposition Plan:  As above    Brief narrative: 49 y.o. female with a past medical history of hypertension, hyperlipidemia, anxiety, depression, and obesity. She was  at work on the day of admission when she collapsed. AED was applied and advised shock x2. Upon EMS arrival, she was intubated and transferred to [facility] She was transferred to [facility] for further evaluation. She has been extubated and is now awake and alert. She does not remember her arrest. Cardiac catheterization demonstrated mild non obstructive CAD and normal LV function. Echocardiogram demonstrated EF 55-60%, mild LVH, no RWMA. K was 3.7 on admission. She has not had prior syncope. There is no family history of sudden death, drowning, or syncope. She does have a history of tachypalpitations for which she was placed on Metoprolol. She reports compliance with home medications and no recent fevers, chills, nausea, or diarrhea. She has not had chest pain or shortness of breath. She works at [employer] and does not smoke, drink, or use recreational drugs. EP has been asked to evaluate for treatment options.  Consultants:  Cardiology  Electrophysiology  Critical care    Procedures: *STUDIES:  7/31 Head CT - no acute findings 7/31 Neck CT - no acute findings  SIGNIFICANT EVENTS: 7/31 - Appeared to be having seizure like activity on admission. Neurology consulted. Movements stopped on their own.  8/02 - LHC: Reportedly no obstructing CAD  Antibiotics: Anti-infectives    Start     Dose/Rate Route Frequency Ordered Stop   04/02/15 1600  gentamicin (GARAMYCIN) 80 mg in sodium chloride irrigation 0.9 % 500 mL irrigation     80 mg Irrigation To Cath Lab 04/01/15 1951 04/03/15 1600   04/02/15 1600  ceFAZolin (ANCEF) IVPB  2 g/50 mL premix     2 g 100 mL/hr over 30 Minutes Intravenous To Cath Lab 04/01/15 1951 04/03/15 1600   04/02/15 1400  ceFAZolin (ANCEF) 2 g in dextrose 5 % 50 mL IVPB  Status:  Discontinued     2 g 100 mL/hr over 30 Minutes Intravenous To Cath Lab 04/01/15 2027 04/01/15 2057         HPI/Subjective: Still has a slight cough,congested  nasally   Objective: Filed Vitals:   04/01/15 1434 04/01/15 1504 04/01/15 2100 04/02/15 0500  BP: 131/93  126/87 117/63  Pulse: 87  102 79  Temp: 98.1 F (36.7 C)  98.2 F (36.8 C) 97.8 F (36.6 C)  TempSrc: Oral  Oral Oral  Resp:      Height:      Weight:    96.344 kg (212 lb 6.4 oz)  SpO2: 92% 95% 100% 98%    Intake/Output Summary (Last 24 hours) at 04/02/15 0940 Last data filed at 04/02/15 0530  Gross per 24 hour  Intake      0 ml  Output   1500 ml  Net  -1500 ml    Exam:  General: Awake. Alert. No distress. Sitting up in bed watching TV and eating breakfast. Integument: Warm & dry. No rash on exposed skin. Bruising right wrist unchanged. HEENT: Moist mucus membranes. No scleral icterus. PERRL. Cardiovascular: Regular rhythm. No edema. No appreciable JVD. Pulmonary: Mild crackles bilateral lung bases. Normal work of breathing on nasal cannula oxygen. Speaking in complete sentences. Abdomen: Soft. Normal bowel sounds. Nondistended.  Neurological: Follows commands. She is aware of the year, president, and season. She is also aware that she is in hospital. She does not remember the events of her code. Musculoskeletal: Chest wall pain to palpation.  Data Review   Micro Results Recent Results (from the past 240 hour(s))  MRSA PCR Screening     Status: None   Collection Time: 03/25/15  8:44 PM  Result Value Ref Range Status   MRSA by PCR NEGATIVE NEGATIVE Final    Comment:        The GeneXpert MRSA Assay (FDA approved for NASAL specimens only), is one component of a comprehensive MRSA colonization surveillance program. It is not intended to diagnose MRSA infection nor to guide or monitor treatment for MRSA infections.     Radiology Reports Dg Abd 1 View  03/25/2015   CLINICAL DATA:  OG tube placement, cardiac arrest  EXAM: ABDOMEN - 1 VIEW  COMPARISON:  CT abdomen pelvis dated 12/30/2014  FINDINGS: Enteric tube is looped in the stomach and terminates  in the gastric cardia.  Nonobstructive bowel gas pattern.  Defibrillator pads overlie the lower chest/upper abdomen.  IMPRESSION: Enteric tube is looped in the stomach and terminates in the gastric cardia.   Electronically Signed   By: Julian Hy M.D.   On: 03/25/2015 16:10   Ct Head Wo Contrast  03/25/2015   CLINICAL DATA:  Found on floor at work.  Unresponsive.  EXAM: CT HEAD WITHOUT CONTRAST  CT CERVICAL SPINE WITHOUT CONTRAST  TECHNIQUE: Multidetector CT imaging of the head and cervical spine was performed following the standard protocol without intravenous contrast. Multiplanar CT image reconstructions of the cervical spine were also generated.  COMPARISON:  10/27/2012  FINDINGS: CT HEAD FINDINGS  No acute intracranial abnormality. Specifically, no hemorrhage, hydrocephalus, mass lesion, acute infarction, or significant intracranial injury. No acute calvarial abnormality. Visualized paranasal sinuses and mastoids clear. Orbital soft tissues unremarkable.  CT CERVICAL SPINE FINDINGS  Motion artifact degrades image quality. No fracture visualized. No malalignment. Prevertebral soft tissues are normal. No epidural or paraspinal hematoma.  IMPRESSION: No intracranial abnormality.  No acute bony abnormality in the cervical spine.   Electronically Signed   By: Rolm Baptise M.D.   On: 03/25/2015 15:06   Ct Cervical Spine Wo Contrast  03/25/2015   CLINICAL DATA:  Found on floor at work.  Unresponsive.  EXAM: CT HEAD WITHOUT CONTRAST  CT CERVICAL SPINE WITHOUT CONTRAST  TECHNIQUE: Multidetector CT imaging of the head and cervical spine was performed following the standard protocol without intravenous contrast. Multiplanar CT image reconstructions of the cervical spine were also generated.  COMPARISON:  10/27/2012  FINDINGS: CT HEAD FINDINGS  No acute intracranial abnormality. Specifically, no hemorrhage, hydrocephalus, mass lesion, acute infarction, or significant intracranial injury. No acute calvarial  abnormality. Visualized paranasal sinuses and mastoids clear. Orbital soft tissues unremarkable.  CT CERVICAL SPINE FINDINGS  Motion artifact degrades image quality. No fracture visualized. No malalignment. Prevertebral soft tissues are normal. No epidural or paraspinal hematoma.  IMPRESSION: No intracranial abnormality.  No acute bony abnormality in the cervical spine.   Electronically Signed   By: Rolm Baptise M.D.   On: 03/25/2015 15:06   Dg Chest Port 1 View  04/01/2015   CLINICAL DATA:  Cough.  History of hypertension.  Initial encounter.  EXAM: PORTABLE CHEST - 1 VIEW  COMPARISON:  03/25/2015 radiographs.  FINDINGS: 1129 hr. Interval extubation with removal of the nasogastric tube and central line. There is improved aeration of the lung bases which are now clear. The heart size and mediastinal contours are stable. There is no pleural effusion or pneumothorax.  IMPRESSION: No active cardiopulmonary process following extubation.   Electronically Signed   By: Richardean Sale M.D.   On: 04/01/2015 12:22   Dg Chest Port 1 View  03/25/2015   CLINICAL DATA:  Central line placement.  Hypothermia.  EXAM: PORTABLE CHEST - 1 VIEW  COMPARISON:  03/25/2015.  FINDINGS: Support apparatus: Endotracheal tube tip 1 cm from the carina. Enteric tube is present doubled back upon itself with the tip at the cardia of the stomach.  New LEFT subclavian central line is present. The tip is in the lower superior vena cava at the cavoatrial junction.  Cardiomediastinal Silhouette:  Unchanged.  Lungs: Opacity at the LEFT costophrenic angle, likely representing atelectasis. Airspace disease could also produce this appearance. No pneumothorax.  Effusions:  None.  Other:  None.  IMPRESSION: 1. Support apparatus as described above. The endotracheal tube has been retracted but remains 1 cm from the carina. 2. Uncomplicated interval placement of LEFT subclavian central line with the tip at the junction of the superior vena cava and RIGHT  atrium. 3. Opacification at the LEFT costophrenic angle which may represent atelectasis or airspace disease.   Electronically Signed   By: Dereck Ligas M.D.   On: 03/25/2015 21:01   Dg Chest Port 1 View  03/25/2015   CLINICAL DATA:  Intubation, cardiac arrest.  EXAM: PORTABLE CHEST - 1 VIEW  COMPARISON:  01/23/2015  FINDINGS: Endotracheal tube has tip in the origin of the left mainstem bronchus. This could be pulled back approximately 3 cm. Nasogastric tube is coiled once over the stomach with tip and side-port over the stomach in the left upper quadrant.  Lungs are hypoinflated without focal consolidation or effusion. Cardiomediastinal silhouette is within normal. Remainder of the exam is unchanged.  IMPRESSION: Hypoinflation without acute cardiopulmonary  disease.  Tubes and lines as described. Note that the endotracheal tube has tip over the origin of the left mainstem bronchus.  These results were called by telephone at the time of interpretation on 03/25/2015 at 4:03 pm to Dr. Thomasene Lot, who verbally acknowledged these results.   Electronically Signed   By: Marin Olp M.D.   On: 03/25/2015 16:03   Mr Card Morphology Wo/w Cm  03/30/2015   CLINICAL DATA:  49 year old female post cardiac arrest. Normal cardiac catheterization. Evaluate for myocarditis and ARVC.  EXAM: CARDIAC MRI  TECHNIQUE: The patient was scanned on a 1.5 Tesla GE magnet. A dedicated cardiac coil was used. Functional imaging was done using Fiesta sequences. 2,3, and 4 chamber views were done to assess for RWMA's. Modified Simpson's rule using a short axis stack was used to calculate an ejection fraction on a dedicated work Conservation officer, nature. The patient received 32 cc of Multihance. After 10 minutes inversion recovery sequences were used to assess for infiltration and scar tissue.  CONTRAST:  32 cc  of Multihance  FINDINGS: 1. Normal left ventricular size, thickness and systolic function (LVEF = 65%). There is of the apical  inferior wall.  LVEDD:  49 mm  LVESD:  31 mm  LVEDV:  111 ml  LVESV:  38 ml  SV:  72 ml  LV CO:  5.9 L/minute  Myocardial mass:  101 g  2. Normal right ventricular size, thickness and systolic function (LVEF = 63%). There are no regional wall motion abnormalities.  RVEDV:  114 ml  RVESV:  42 ml  SV:  72 ml  RV CO:  5.9 L/minute  3.  Mild left atrial dilatation.  4.  Mild mitral and mild tricuspid regurgitation.  5. Normal size of the aorta and main pulmonary artery.  6. There is late subendocardial late gadolinium enhancement in the apical inferior wall.  IMPRESSION: 1. Normal left ventricular size, thickness and systolic function (LVEF = 65%) with hypokinesis of the apical inferior wall.  2. Normal right ventricular size, thickness and systolic function (LVEF = 63%) with no regional wall motion abnormalities.  3.  Mild left atrial dilatation.  4.  Mild mitral and mild tricuspid regurgitation.  5. A subendocardial late gadolinium enhancement in the apical inferior wall is consistent with prior subendocardial infarct.  6.  There is no evidence for ARVC.  Ena Dawley   Electronically Signed   By: Ena Dawley   On: 03/30/2015 18:34     CBC  Recent Labs Lab 03/29/15 0330 03/30/15 0530 03/30/15 1400 03/31/15 0445 04/01/15 0339 04/02/15 0415  WBC 14.4* 7.7  --  7.5 6.9 7.5  HGB 11.5* 9.5* 10.2* 10.0* 10.5* 11.5*  HCT 35.4* 28.9* 31.6* 31.4* 32.6* 35.4*  PLT 304 254  --  273 297 300  MCV 93.2 93.2  --  93.2 93.1 93.4  MCH 30.3 30.6  --  29.7 30.0 30.3  MCHC 32.5 32.9  --  31.8 32.2 32.5  RDW 14.6 14.7  --  14.6 14.4 14.6    Chemistries   Recent Labs Lab 03/27/15 0435  03/29/15 0330 03/30/15 0515 03/30/15 0530 03/31/15 0445 04/01/15 0339 04/02/15 0415  NA 139  < > 141 142 143 139 141 138  K 2.9*  < > 3.5 3.0* 3.0* 3.6 3.7 3.8  CL 108  < > 110 110 111 107 106 101  CO2 22  < > 20* 26 26 26 28 28   GLUCOSE 127*  < >  116* 144* 145* 100* 121* 106*  BUN 7  < > <5* 5* 5* 7 8 7    CREATININE 0.74  < > 0.68 0.62 0.63 0.58 0.52 0.50  CALCIUM 7.9*  < > 8.4* 7.8* 7.8* 8.1* 8.7* 8.7*  MG 2.0  < > 2.2  --  2.0 1.8 1.9 2.1  AST 72*  --   --   --   --   --  37  --   ALT 61*  --   --   --   --   --  34  --   ALKPHOS 51  --   --   --   --   --  63  --   BILITOT 0.9  --   --   --   --   --  0.2*  --   < > = values in this interval not displayed. ------------------------------------------------------------------------------------------------------------------ estimated creatinine clearance is 96.1 mL/min (by C-G formula based on Cr of 0.5). ------------------------------------------------------------------------------------------------------------------ No results for input(s): HGBA1C in the last 72 hours. ------------------------------------------------------------------------------------------------------------------ No results for input(s): CHOL, HDL, LDLCALC, TRIG, CHOLHDL, LDLDIRECT in the last 72 hours. ------------------------------------------------------------------------------------------------------------------ No results for input(s): TSH, T4TOTAL, T3FREE, THYROIDAB in the last 72 hours.  Invalid input(s): FREET3 ------------------------------------------------------------------------------------------------------------------ No results for input(s): VITAMINB12, FOLATE, FERRITIN, TIBC, IRON, RETICCTPCT in the last 72 hours.  Coagulation profile  Recent Labs Lab 03/26/15 1500  INR 1.13    No results for input(s): DDIMER in the last 72 hours.  Cardiac Enzymes  Recent Labs Lab 03/26/15 1520 03/26/15 1935 03/27/15 0155  TROPONINI 6.14* 4.67* 4.31*   ------------------------------------------------------------------------------------------------------------------ Invalid input(s): POCBNP   CBG:  Recent Labs Lab 04/01/15 0743 04/01/15 1124 04/01/15 1635 04/01/15 2151 04/02/15 0739  GLUCAP 93 98 95 97 88       Studies: Dg Chest Port 1  View  04/01/2015   CLINICAL DATA:  Cough.  History of hypertension.  Initial encounter.  EXAM: PORTABLE CHEST - 1 VIEW  COMPARISON:  03/25/2015 radiographs.  FINDINGS: 1129 hr. Interval extubation with removal of the nasogastric tube and central line. There is improved aeration of the lung bases which are now clear. The heart size and mediastinal contours are stable. There is no pleural effusion or pneumothorax.  IMPRESSION: No active cardiopulmonary process following extubation.   Electronically Signed   By: Richardean Sale M.D.   On: 04/01/2015 12:22      No results found for: HGBA1C Lab Results  Component Value Date   CREATININE 0.50 04/02/2015       Scheduled Meds: . albuterol  2.5 mg Nebulization BID  . aspirin  81 mg Oral Daily  . atorvastatin  80 mg Oral q1800  .  ceFAZolin (ANCEF) IV  2 g Intravenous To Cath  . chlorhexidine  60 mL Topical Once  . chlorhexidine  60 mL Topical Once  . fluticasone  1 spray Each Nare Daily  . furosemide  40 mg Intravenous Once  . gentamicin irrigation  80 mg Irrigation To Cath  . levETIRAcetam  500 mg Oral BID  . magnesium oxide  400 mg Oral Daily  . metoprolol tartrate  12.5 mg Oral BID   Continuous Infusions: . sodium chloride 50 mL/hr at 04/02/15 0635  . sodium chloride 50 mL/hr at 04/02/15 1751    Principal Problem:   Cardiac arrest Active Problems:   Status epilepticus   Seizure   Essential hypertension   Hyperlipemia    Time spent: 45 minutes   Naylani Bradner  [facility] Pager [phone]. If 7PM-7AM, please contact night-coverage at [email or web address], password [facility] 04/02/2015, 9:40 AM  LOS: 8 days

## 2015-04-02 NOTE — Progress Notes (Signed)
Name: Claire Mcdonald MRN: [id number] DOB: 02/25/1966    ADMISSION DATE:  03/25/2015 CONSULTATION DATE:  04/02/15  REFERRING MD :  Dr. Allyson Sabal  CHIEF COMPLAINT:  SOB  BRIEF PATIENT DESCRIPTION:  49 y/o F admitted 7/31 with witnessed seizure like activity.  EMS evaluation advised shock x2, intubated and transferred to [facility] for further evaluation.  She underwent cardiac cath on 8/2 which showed mild non-obstructive CAD and normal LV function.  PCCM called back am of 8/8 for dyspnea.    SIGNIFICANT EVENTS  7/31  Appeared to be having seizure like activity on admission. Neurology consulted. Movements stopped on their own.  8/02  LHC 8/05  Cardiac MRI  8/08  PlannedICD placement but held due to SOB   STUDIES:  7/31 Head CT - no acute findings 7/31 Neck CT - no acute findings 8/02 LHC - proximal LAD lesion 10%, mid LAD 30% stenosed, nml LV function, EF 50-55% 8/05 Cardiac MRI: A subendocardial late gadolinium enhancement in the apical inferior wall is consistent with prior subendocardial infarct.  SUBJECTIVE:  PCCM called back for SOB.  Was planned for ICD placement but held due to SOB.  CXR reviewed from 8/7 & essentially clear.  Pt reports SOB with exertion, easily fatigues, minimally productive cough, increased LE swelling.  Pt reports prior history of blood clot in LE (unable to find in EPIC review).  She states it was diagnosed at [facility] and that they called her at home to tell her of the diagnosis (no treatment with anticoagulation ??).    VITAL SIGNS: Temp:  [97.8 F (36.6 C)-98.2 F (36.8 C)] 97.8 F (36.6 C) (08/08 0500) Pulse Rate:  [79-102] 79 (08/08 0500) BP: (117-131)/(63-93) 117/63 mmHg (08/08 0500) SpO2:  [92 %-100 %] 98 % (08/08 0500) Weight:  [212 lb 6.4 oz (96.344 kg)] 212 lb 6.4 oz (96.344 kg) (08/08 0500)  PHYSICAL EXAMINATION: General:  Morbidly obese female in NAD  Neuro:  AAOx4, MAE, speech clear  HEENT:  MM pink/moist, no jvd  Cardiovascular:  s1s2 rrr,tachy,  no  m/r/g Lungs:  Even/non-labored at rest, lungs bilaterally clear. Exertional dyspnea  Abdomen:  Obese,soft, bsx4 active  Musculoskeletal:  No acute deformities  Skin:  Warm/dry, BLE 1-2+ pitting edema, TTP posterior RLE   Recent Labs Lab 03/31/15 0445 04/01/15 0339 04/02/15 0415  NA 139 141 138  K 3.6 3.7 3.8  CL 107 106 101  CO2 26 28 28   BUN 7 8 7   CREATININE 0.58 0.52 0.50  GLUCOSE 100* 121* 106*    Recent Labs Lab 03/31/15 0445 04/01/15 0339 04/02/15 0415  HGB 10.0* 10.5* 11.5*  HCT 31.4* 32.6* 35.4*  WBC 7.5 6.9 7.5  PLT 273 297 300   Dg Chest Port 1 View  04/01/2015   CLINICAL DATA:  Cough.  History of hypertension.  Initial encounter.  EXAM: PORTABLE CHEST - 1 VIEW  COMPARISON:  03/25/2015 radiographs.  FINDINGS: 1129 hr. Interval extubation with removal of the nasogastric tube and central line. There is improved aeration of the lung bases which are now clear. The heart size and mediastinal contours are stable. There is no pleural effusion or pneumothorax.  IMPRESSION: No active cardiopulmonary process following extubation.   Electronically Signed   By: Richardean Sale M.D.   On: 04/01/2015 12:22    ASSESSMENT / PLAN:  Dyspnea - patient admitted with syncopal event with shock x2.  Essentially negative LHC with normal LVEF.  Patient continues to complain of dyspnea.  Pacemaker insertion  canceled for this am due to SOB.  Room air saturations 94% on my assessment. CXR essentially clear from 8/7 am.   She reports a history of "blood clots in her leg but was never treated with anticoagulation".  Suspect there is likely an element of deconditioning and morbid obesity but must rule out PE as well.  She is a life long non-smoker.   Cough - minimal clear to green sputum production.  No difficulty with swallowing etc.  Prior intubation this admit.    Plan: Hold am lasix (contrast administration) Assess STAT LE doppler and CTA of Chest to r/o PE / DVT D-Dimer  Oxygen as needed  to support saturations > 92% PT evaluation If PE work up negative, consider PFT's to assess for restrictive disease in the setting of morbid obesity.   Incentive spirometry / pulmonary hygiene Cough suppression per primary    Noe Gens, NP-C [facility] Pgr: [phone] or if no answer [phone] 04/02/2015, 10:10 AM   Attending Note:  49 year old female who was admitted after a syncopal episode was found in VT and was shocked x2.  Now needs a pace maker placed.  Complaints of continued SOB.  Patient is very obese and severely deconditioned.  I reviewed CXR myself, diffuse atelectasis noted.  Discussed with Dr. Jens Som and Raymond.  I am concerned forPE.;  SOB: likely multifactorial with obesity and deconditioning.  - Supplemental O2 as needed.  - Titrate O2.  PE: Concern for it.  - Assess STAT LE doppler and CTA of Chest to r/o PE / DVT   - D-Dimer   - Oxygen as needed to support saturations > 92%  - PT evaluation  - If PE work up negative, consider PFT's to assess for restrictive disease in the setting of morbid obesity.    - Incentive spirometry / pulmonary hygiene  Deconditioning - needs PT.  Pulmonary edema: Lasix on hold due to contrast with CT then can restart.  VT: needs pacer.  Later this week.  Patient seen and examined, agree with above note.  I dictated the care and orders written for this patient under my direction.  Rush Farmer, MD [phone]

## 2015-04-02 NOTE — Progress Notes (Signed)
    Subjective: SOB off and on  Objective: Vital signs in last 24 hours: Temp:  [97.8 F (36.6 C)-98.2 F (36.8 C)] 97.8 F (36.6 C) (08/08 0500) Pulse Rate:  [79-104] 79 (08/08 0500) BP: (117-131)/(63-93) 117/63 mmHg (08/08 0500) SpO2:  [92 %-100 %] 98 % (08/08 0500) Weight:  [212 lb 6.4 oz (96.344 kg)] 212 lb 6.4 oz (96.344 kg) (08/08 0500) Last BM Date: 04/01/15  Intake/Output from previous day: 08/07 0701 - 08/08 0700 In: -  Out: 2000 [Urine:2000] Intake/Output this shift:    Medications Scheduled Meds: . albuterol  2.5 mg Nebulization BID  . aspirin  81 mg Oral Daily  . atorvastatin  80 mg Oral q1800  .  ceFAZolin (ANCEF) IV  2 g Intravenous To Cath  . chlorhexidine  60 mL Topical Once  . chlorhexidine  60 mL Topical Once  . fluticasone  1 spray Each Nare Daily  . gentamicin irrigation  80 mg Irrigation To Cath  . levETIRAcetam  500 mg Oral BID  . magnesium oxide  400 mg Oral Daily  . metoprolol tartrate  12.5 mg Oral BID   Continuous Infusions: . sodium chloride 50 mL/hr at 04/02/15 0635  . sodium chloride 50 mL/hr at 04/02/15 0636   PRN Meds:.sodium chloride, acetaminophen (TYLENOL) oral liquid 160 mg/5 mL, acetaminophen, HYDROcodone-acetaminophen, HYDROcodone-homatropine, ondansetron (ZOFRAN) IV, phenol, promethazine, sodium chloride  PE: General appearance: alert, cooperative, mild resp distress and She was sleeping when I entered Lungs: Bilateral rhonchi wheeaes Heart: regular rate and rhythm, S1, S2 normal, no murmur, click, rub or gallop Extremities: No LEE Pulses: 2+ and symmetric Skin: Warm and dry Neurologic: Grossly normal  Lab Results:   Recent Labs  03/31/15 0445 04/01/15 0339 04/02/15 0415  WBC 7.5 6.9 7.5  HGB 10.0* 10.5* 11.5*  HCT 31.4* 32.6* 35.4*  PLT 273 297 300   BMET  Recent Labs  03/31/15 0445 04/01/15 0339 04/02/15 0415  NA 139 141 138  K 3.6 3.7 3.8  CL 107 106 101  CO2 26 28 28   GLUCOSE 100* 121* 106*  BUN 7 8  7   CREATININE 0.58 0.52 0.50  CALCIUM 8.1* 8.7* 8.7*    Assessment/Plan  49 y.o. female with a past medical history of hypertension, hyperlipidemia, anxiety, depression, and obesity. She was at work on the day of admission when she collapsed. AED was applied and advised shock x2. Upon EMS arrival, she was intubated and transferred to [facility]. Principal Problem:   Cardiac arrest Active Problems:   Status epilepticus   Seizure   Essential hypertension   Hyperlipemia  Cardiac catheterization demonstrated mild non obstructive CAD and normal LV function. Echocardiogram demonstrated EF 55-60%, mild LVH, no RWMA. K was 3.7 on admission.  Cardiac MRI:  A subendocardial late gadolinium enhancement in the apical inferior wall is consistent with prior subendocardial infarct.  SP flecainide challenge.  NPO for ICD today.    LOS: 8 days    HAGER, BRYAN PA-C 04/02/2015 7:53 AM  Pt very short of breath  Not ready for implant  Will ask pulm to see CXR surprisingly clear Will give one dose of IV diuretic Ask PT to see Daniels ICD

## 2015-04-02 NOTE — Progress Notes (Signed)
SLP Cancellation Note  Patient Details Name: Claire Mcdonald MRN: [id number] DOB: 11-16-1965   Cancelled treatment:       Reason Eval/Treat Not Completed: Other (comment) (NPO for ICD placement later today). Will continue to follow.   Kern Reap, [place], CCC-SLP 04/02/2015, 8:51 AM [phone]

## 2015-04-02 NOTE — Progress Notes (Addendum)
Preliminary results by tech - Positive for deep vein thrombosis in the right lower extremity involving the  proximal and mid peroneal veins. No evidence of deep or superficial vein thrombosis in the left lower extremity. Results given to patient's nurse. Oda Cogan, BS, RDMS, RVT

## 2015-04-03 ENCOUNTER — Telehealth: Payer: Self-pay | Admitting: Pain Medicine

## 2015-04-03 ENCOUNTER — Inpatient Hospital Stay (HOSPITAL_COMMUNITY): Payer: Medicaid Other

## 2015-04-03 DIAGNOSIS — I82409 Acute embolism and thrombosis of unspecified deep veins of unspecified lower extremity: Secondary | ICD-10-CM

## 2015-04-03 LAB — COMPREHENSIVE METABOLIC PANEL
ALK PHOS: 92 U/L (ref 38–126)
ALT: 63 U/L — AB (ref 14–54)
ANION GAP: 8 (ref 5–15)
AST: 66 U/L — ABNORMAL HIGH (ref 15–41)
Albumin: 2.8 g/dL — ABNORMAL LOW (ref 3.5–5.0)
BUN: 12 mg/dL (ref 6–20)
CALCIUM: 8.6 mg/dL — AB (ref 8.9–10.3)
CO2: 30 mmol/L (ref 22–32)
Chloride: 98 mmol/L — ABNORMAL LOW (ref 101–111)
Creatinine, Ser: 0.62 mg/dL (ref 0.44–1.00)
GFR calc Af Amer: >60 mL/min (ref >60)
Glucose, Bld: 115 mg/dL — ABNORMAL HIGH (ref 65–99)
POTASSIUM: 3.9 mmol/L (ref 3.5–5.1)
SODIUM: 136 mmol/L (ref 135–145)
Total Bilirubin: 0.5 mg/dL (ref 0.3–1.2)
Total Protein: 5.9 g/dL — ABNORMAL LOW (ref 6.5–8.1)

## 2015-04-03 LAB — CBC
HCT: 36.5 % (ref 36.0–46.0)
Hemoglobin: 11.7 g/dL — ABNORMAL LOW (ref 12.0–15.0)
MCH: 30.2 pg (ref 26.0–34.0)
MCHC: 32.1 g/dL (ref 30.0–36.0)
MCV: 94.1 fL (ref 78.0–100.0)
Platelets: 344 10*3/uL (ref 150–400)
RBC: 3.88 MIL/uL (ref 3.87–5.11)
RDW: 14.5 % (ref 11.5–15.5)
WBC: 8.6 10*3/uL (ref 4.0–10.5)

## 2015-04-03 LAB — GLUCOSE, CAPILLARY
GLUCOSE-CAPILLARY: 85 mg/dL (ref 65–99)
GLUCOSE-CAPILLARY: 84 mg/dL (ref 65–99)
GLUCOSE-CAPILLARY: 105 mg/dL — AB (ref 65–99)
Glucose-Capillary: 92 mg/dL (ref 65–99)

## 2015-04-03 LAB — HEPARIN LEVEL (UNFRACTIONATED)
HEPARIN UNFRACTIONATED: 0.48 [IU]/mL (ref 0.30–0.70)
HEPARIN UNFRACTIONATED: 0.54 [IU]/mL (ref 0.30–0.70)

## 2015-04-03 LAB — MAGNESIUM: MAGNESIUM: 1.9 mg/dL (ref 1.7–2.4)

## 2015-04-03 MED ORDER — WARFARIN - PHARMACIST DOSING INPATIENT
Freq: Every day | Status: DC
Start: 2015-04-03 — End: 2015-04-12
  Administered 2015-04-05: 18:00:00

## 2015-04-03 MED ORDER — COUMADIN BOOK
Freq: Once | Status: AC
  Administered 2015-04-03: 20:00:00
  Filled 2015-04-03: qty 1

## 2015-04-03 MED ORDER — WARFARIN SODIUM 7.5 MG PO TABS
7.5000 mg | ORAL_TABLET | Freq: Once | ORAL | Status: AC
  Administered 2015-04-03: 7.5 mg via ORAL
  Filled 2015-04-03: qty 1

## 2015-04-03 MED ORDER — WARFARIN VIDEO: Freq: Once | Status: DC

## 2015-04-03 NOTE — Progress Notes (Signed)
Physical Therapy Treatment Patient Details Name: Claire Mcdonald MRN: [id number] DOB: 1966/07/27 Today's Date: 04/03/2015    History of Present Illness Claire Mcdonald is 49 y/o woman with past medical history of hypertension, chronic pain and depression who is on gabapentin and prn topomax. She was working on evening of admit when her coworkers called EMS to report seizure like activity.She was shocked twice and a King airway placed. She was noted to again have seizure like activity in the ambulance and in the ED at [facility]. She was extubated on 8/4.  (+) LE DVT.  Started on IV heparin.    PT Comments    Pt's HR is better controlled and she is on IV heparin for (+) DVT and it is therapeutic.  Pt was excited to be able to walk in the hallway today.  HR and O2 sats remained stable throughout session.  Pt did use RW to help steady herself during gait.  PT will continue to follow acutely and recommend HHPT with transition to OP cardiac rehab at discharge.  At this time, pt may also need a RW.   Follow Up Recommendations  Home health PT;Other (comment) (with transition to OP cardiac rehab)     Equipment Recommendations  Rolling walker with 5" wheels    Recommendations for Other Services   NA     Precautions / Restrictions Precautions Precautions: Other (comment) Precaution Comments: monitor HR    Mobility               Transfers Overall transfer level: Needs assistance Equipment used: Rolling walker (2 wheeled) Transfers: Sit to/from Stand Sit to Stand: Min guard         General transfer comment: min guard assist for safety during transitions  Ambulation/Gait Ambulation/Gait assistance: Min guard Ambulation Distance (Feet): 150 Feet Assistive device: Rolling walker (2 wheeled) Gait Pattern/deviations: Step-through pattern;Shuffle;Trunk flexed Gait velocity: decreased Gait velocity interpretation: Below normal speed for age/gender General Gait Details: Verbal cues for closer  proximity to RW.  Mild DOE with gait 2/4.  O2 sats in the mid to upper 90s, HR 110s throughout gait.           Balance Overall balance assessment: Needs assistance Sitting-balance support: Feet supported;No upper extremity supported Sitting balance-Leahy Scale: Good     Standing balance support: No upper extremity supported;Bilateral upper extremity supported;Single extremity supported Standing balance-Leahy Scale: Fair                      Cognition Arousal/Alertness: Awake/alert Behavior During Therapy: WFL for tasks assessed/performed Overall Cognitive Status: Within Functional Limits for tasks assessed                             Pertinent Vitals/Pain Pain Assessment: No/denies pain           PT Goals (current goals can now be found in the care plan section) Acute Rehab PT Goals Patient Stated Goal: to return home and get stronger Progress towards PT goals: Progressing toward goals    Frequency  Min 3X/week    PT Plan Current plan remains appropriate       End of Session Equipment Utilized During Treatment: Gait belt Activity Tolerance: Patient limited by fatigue Patient left: in chair;with call bell/phone within reach;with family/visitor present     Time: 1243-1310 PT Time Calculation (min) (ACUTE ONLY): 27 min  Charges:  $Gait Training: 23-37 mins  Barbarann Ehlers Kyel Purk, PT, DPT [phone]   04/03/2015, 1:15 PM

## 2015-04-03 NOTE — Telephone Encounter (Signed)
Thank you for the followup.

## 2015-04-03 NOTE — Care Management Note (Addendum)
Case Management Note  Patient Details  Name: Claire Mcdonald MRN: [id number] Date of Birth: 1965-12-29  Subjective/Objective:      Pt with a past medical history of hypertension, hyperlipidemia, anxiety, depression, and obesity. She was at work on the day of admission when she collapsed. AED was applied and advised shock x2. Upon EMS arrival, she was intubated and transferred to [facility]. She was transferred to [facility] for further evaluation. She has been extubated and is now awake and alert. Cardiac catheterization demonstrated mild non obstructive CAD and normal LV function. Echocardiogram demonstrated EF 55-60%, mild LVH, no RWMA. EP has been asked to evaluate for treatment options. Plan for ICD implant 04/04/15                Action/Plan: CM will discuss disposition needs post procedure. Pt has Medicaid for Insurance and may not be able to get PT services due to does not have a qualifying diagnosis via Medicaid. CM will continue to monitor.   Expected Discharge Date:                  Expected Discharge Plan:  [place]  In-House Referral:     Discharge planning Services  CM Consult  Post Acute Care Choice:    Choice offered to:     DME Arranged:    DME Agency:     HH Arranged:    [place] Agency:     Status of Service:  In process, will continue to follow  Medicare Important Message Given:    Date Medicare IM Given:    Medicare IM give by:    Date Additional Medicare IM Given:    Additional Medicare Important Message give by:     If discussed at [place] of Stay Meetings, dates discussed:    Additional Comments: 04-06-15 [address] Jacqlyn Krauss, RN,BSN [phone] CM did speak with pt in regards to [facility] services. Insurance will not pay for home services, however she may get a couple of outpatient visits for PT services. CM did call the [facility] in [place] to see if they could service pt. CM did leave voice mail.  CM will continue to monitor.    Bethena Roys, RN 04/03/2015, 4:17 PM

## 2015-04-03 NOTE — Progress Notes (Signed)
PT Cancellation Note  Patient Details Name: EMAAN Mcdonald MRN: [id number] DOB: May 12, 1966   Cancelled Treatment:    Reason Eval/Treat Not Completed: Other (comment) (pt eating lunch).  Pt will come back after lunch to see pt.  Her heparin is therapeutic and her HR in standing was 110s (much better than yesterday).     Barbarann Ehlers [place], [place], DPT [phone]   04/03/2015, 12:14 PM

## 2015-04-03 NOTE — Telephone Encounter (Signed)
Called to cancel apt on Thursday 8/11 due to in hospital at [facility] having a pace maker put in.

## 2015-04-03 NOTE — Progress Notes (Addendum)
ANTICOAGULATION CONSULT NOTE - Follow Up Consult  Pharmacy Consult for Heparin >> Coumadin Indication: DVT  Allergies  Allergen Reactions  . Toradol [Ketorolac Tromethamine] Itching and Rash  . Tramadol Itching and Rash  . Zofran [Ondansetron Hcl] Itching and Rash    Patient Measurements: Height: 5\' 3"  (160 cm) Weight: 211 lb 14.4 oz (96.117 kg) IBW/kg (Calculated) : 52.4 Heparin Dosing Weight: 75 kg  Vital Signs: Temp: 98.2 F (36.8 C) (08/09 0500) Temp Source: Oral (08/09 0500) BP: 114/78 mmHg (08/09 0500) Pulse Rate: 92 (08/09 0500)  Labs:  Recent Labs  04/01/15 0339 04/02/15 0415 04/03/15 0254 04/03/15 1015  HGB 10.5* 11.5* 11.7*  --   HCT 32.6* 35.4* 36.5  --   PLT 297 300 344  --   HEPARINUNFRC  --   --  0.48 0.54  CREATININE 0.52 0.50 0.62  --     Estimated Creatinine Clearance: 95.9 mL/min (by C-G formula based on Cr of 0.62).   Medications:  Scheduled:  . albuterol  2.5 mg Nebulization BID  . aspirin  81 mg Oral Daily  . atorvastatin  80 mg Oral q1800  .  ceFAZolin (ANCEF) IV  2 g Intravenous To Cath  . chlorhexidine  60 mL Topical Once  . chlorhexidine  60 mL Topical Once  . fluticasone  1 spray Each Nare Daily  . gentamicin irrigation  80 mg Irrigation To Cath  . heparin  5,000 Units Intravenous Once  . levETIRAcetam  500 mg Oral BID  . magnesium oxide  400 mg Oral Daily  . metoprolol tartrate  12.5 mg Oral BID   Infusions:  . sodium chloride 50 mL/hr at 04/02/15 0635  . sodium chloride 50 mL/hr at 04/02/15 0636  . heparin 1,200 Units/hr (04/03/15 0948)    Assessment: 49 yo F on heparin for RLE DVT.  Pt reports a hx of leg clots never treated with anticoagulation.  Heparin level is therapeutic on 1200 units/hr.  To also start Coumadin today.  Noted plans for ICD placement when INR >2.  Goal of Therapy:  Heparin level 0.3-0.7 units/ml  INR 2-3 Monitor platelets by anticoagulation protocol: Yes   Plan:  Continue heparin at 1200  units/hr. Coumadin 7.5mg  PO x 1 tonight Daily INR, heparin level and CBC. Coumadin book and video.  [employer], Pharm.D., BCPS Clinical Pharmacist Pager [phone] 04/03/2015 11:24 AM

## 2015-04-03 NOTE — Progress Notes (Addendum)
SUBJECTIVE: The patient is doing well today.  At this time, she denies chest pain, shortness of breath, or any new concerns.  CURRENT MEDICATIONS: . albuterol  2.5 mg Nebulization BID  . aspirin  81 mg Oral Daily  . atorvastatin  80 mg Oral q1800  .  ceFAZolin (ANCEF) IV  2 g Intravenous To Cath  . chlorhexidine  60 mL Topical Once  . chlorhexidine  60 mL Topical Once  . fluticasone  1 spray Each Nare Daily  . gentamicin irrigation  80 mg Irrigation To Cath  . heparin  5,000 Units Intravenous Once  . levETIRAcetam  500 mg Oral BID  . magnesium oxide  400 mg Oral Daily  . metoprolol tartrate  12.5 mg Oral BID   . sodium chloride 50 mL/hr at 04/02/15 0635  . sodium chloride 50 mL/hr at 04/02/15 0636  . heparin 1,200 Units/hr (04/02/15 1646)    OBJECTIVE: Physical Exam: Filed Vitals:   04/02/15 1030 04/02/15 1109 04/02/15 2019 04/03/15 0500  BP:  107/64 118/75 114/78  Pulse: 128 120 84 92  Temp:   98.3 F (36.8 C) 98.2 F (36.8 C)  TempSrc:   Oral Oral  Resp:   16 16  Height:      Weight:    211 lb 14.4 oz (96.117 kg)  SpO2: 98%  97% 96%    Intake/Output Summary (Last 24 hours) at 04/03/15 0902 Last data filed at 04/03/15 0000  Gross per 24 hour  Intake   86.8 ml  Output    600 ml  Net -513.2 ml    Telemetry reveals sinus rhythm  GEN- The patient is obese appearing, alert and oriented x 3 today.   Head- normocephalic, atraumatic Eyes-  Sclera clear, conjunctiva pink Ears- hearing intact Oropharynx- clear Neck- supple  Lungs- Clear to ausculation bilaterally, normal work of breathing Heart- Regular rate and rhythm, no murmurs, rubs or gallops  GI- soft, NT, ND, + BS Extremities- no clubbing, cyanosis, or edema Skin- no rash or lesion Psych- euthymic mood, full affect Neuro- strength and sensation are intact  LABS: Basic Metabolic Panel:  Recent Labs  04/01/15 0339 04/02/15 0415 04/03/15 0254  NA 141 138 136  K 3.7 3.8 3.9  CL 106 101 98*  CO2  28 28 30   GLUCOSE 121* 106* 115*  BUN 8 7 12   CREATININE 0.52 0.50 0.62  CALCIUM 8.7* 8.7* 8.6*  MG 1.9 2.1 1.9  PHOS 4.3 4.6  --    Liver Function Tests:  Recent Labs  04/01/15 0339 04/02/15 0415 04/03/15 0254  AST 37  --  66*  ALT 34  --  63*  ALKPHOS 63  --  92  BILITOT 0.2*  --  0.5  PROT 5.4*  --  5.9*  ALBUMIN 2.4* 2.6* 2.8*   CBC:  Recent Labs  04/02/15 0415 04/03/15 0254  WBC 7.5 8.6  HGB 11.5* 11.7*  HCT 35.4* 36.5  MCV 93.4 94.1  PLT 300 344   D-Dimer:  Recent Labs  04/02/15 1213  DDIMER 2.77*    RADIOLOGY: Ct Angio Chest Pe W/cm &/or Wo Cm 04/02/2015   CLINICAL DATA:  Dyspnea.  History of deep venous thrombosis.  EXAM: CT ANGIOGRAPHY CHEST WITH CONTRAST  TECHNIQUE: Multidetector CT imaging of the chest was performed using the standard protocol during bolus administration of intravenous contrast. Multiplanar CT image reconstructions and MIPs were obtained to evaluate the vascular anatomy.  CONTRAST:  6mL OMNIPAQUE IOHEXOL 350 MG/ML SOLN  COMPARISON:  Chest x-ray dated 04/01/2015  FINDINGS: There are no pulmonary emboli. Heart size is normal. No hilar or mediastinal adenopathy. Small amount of calcification in the left anterior descending coronary artery.  Thoracic scoliosis. No acute osseous abnormality. No effusions. 8 mm ground-glass ill-defined abnormality in the right upper lobe. Lungs are otherwise clear. The visualized portion of the upper abdomen is normal.  Review of the MIP images confirms the above findings.  IMPRESSION: 1. No pulmonary emboli. 2. There is an 8 mm ground-glass ill-defined area of abnormal density at the right lung apex best seen on image 25 of series 407. This is indeterminate in etiology.  If the patient is at high risk for bronchogenic carcinoma, follow-up chest CT at 3-42months is recommended. If the patient is at low risk for bronchogenic carcinoma, follow-up chest CT at 6-12 months is recommended. This recommendation follows the  consensus statement: Guidelines for Management of Small Pulmonary Nodules Detected on CT Scans: A Statement from the [place] as published in Radiology 2005; 237:395-400.   Electronically Signed   By: Lorriane Shire M.D.   On: 04/02/2015 14:36   Dg Chest Port 1 View 04/01/2015   CLINICAL DATA:  Cough.  History of hypertension.  Initial encounter.  EXAM: PORTABLE CHEST - 1 VIEW  COMPARISON:  03/25/2015 radiographs.  FINDINGS: 1129 hr. Interval extubation with removal of the nasogastric tube and central line. There is improved aeration of the lung bases which are now clear. The heart size and mediastinal contours are stable. There is no pleural effusion or pneumothorax.  IMPRESSION: No active cardiopulmonary process following extubation.   Electronically Signed   By: Richardean Sale M.D.   On: 04/01/2015 12:22   ASSESSMENT AND PLAN:  Principal Problem:   Cardiac arrest Active Problems:   Status epilepticus   Seizure   Essential hypertension   Hyperlipemia   Cough   Dyspnea   Acute pulmonary embolism  1.  Resuscitated VF arrest Cardiac cath with no obstructive CAD and normal LV function.  Cardiac MRI with prior subendocardial infarct. Plan for ICD implant 04/04/15 No driving x6 months Keep K >3.9, Mg >1.8  2.  HTN Stable No change required today  3.  Shortness of breath Improved today CT negative for PE LE dopplers positive for right LE DVT Pt currently on Heparin Would prefer Xarelto for DVT treatment with plans for ICD tomorrow Will discuss with Dr Caryl Comes  NPO after clear liquid breakfast tomorrow.   Chanetta Marshall, NP 04/03/2015 9:07 AM  For ICD  In am Resp status is improved Needs to ambulate  Bleeding risks associated with ICD implantation in the context of acute anticoagulation however are in the range of 15-20%. Alternative anticoagulation with NOACs requires high-dose anticoagulation and the first 7 days i.e. apixaban 10 and/or Rivaroxaban 15 twice a day. As  unfortunate as it is, I think the safer thing is to put her on Coumadin and allow her INR to become therapeutic and to do it with   therapeutic INRin which case the bleeding risks are in the 2%

## 2015-04-03 NOTE — Progress Notes (Addendum)
[facility] PROGRESS NOTE  Claire Mcdonald UKG:[id number] DOB: 11-Aug-1966 DOA: 03/25/2015 PCP: Golden Pop, MD  Assessment/Plan: Principal Problem:   Cardiac arrest Active Problems:   Status epilepticus   Seizure   Essential hypertension   Hyperlipemia   Cough   Dyspnea   Acute pulmonary embolism   DVT (deep venous thrombosis)    Resuscitated VF arrest-patient has had a resuscitated arrest. Cardiac catheterization with non obstructive CAD and normal LV function. There is no family history of sudden death or syncope. Cardiac MRI with prior subendocardial infarct.  Flecanide challenge by  Dr Caryl Comes - completed Keep K>3.9, Mg >1.8. No driving x6 months. Patient will ultimately need an ICD, for secondary prevention . This is anticipated to be done when INR is therapeutic per Dr Olin Pia note.  Now on heparin and coumadin per cards recommendations   Cough, productive of green sputum, no difficulty swallowing, Chest x-ray reviewed-no infiltrates Coarse breath sounds but no rhonchi, CT angiogram reviewed, negative PE, does have proximal right lower extremity DVT and needs anticoagulation for at least 3 months. She is clear from pulmonary standpoint to proceed with ICD placement. Pulmonary nodule -Will need follow-up CT in 6 months in this never smoker, low risk for malignancy Continue Chloraseptic spray,  Hycodan cough syrup, flonase and ocean nasal spray    DVT Currently on a heparin drip, initiated on coumadin   Endotracheal intubation for airway protection status post   cardiac arrest Now extubated, on room air  Dyslipidemia-continue statin  Hypertension-continue metoprolol  Hypokalemia keep potassium greater than 4.0  Status epilepticus-seizure disorder, seen by neurology, continue Keppra , EEG w/o seizure activity   Code Status:      Code Status Orders        Start     Ordered   03/27/15 1450  Full code   Continuous     03/27/15 1454     Family  Communication: family updated about patient's clinical progress Disposition Plan:  As above    Brief narrative: 49 y.o. female with a past medical history of hypertension, hyperlipidemia, anxiety, depression, and obesity. She was at work on the day of admission when she collapsed. AED was applied and advised shock x2. Upon EMS arrival, she was intubated and transferred to [facility]. She was transferred to [facility] for further evaluation. She has been extubated and is now awake and alert. She does not remember her arrest. Cardiac catheterization demonstrated mild non obstructive CAD and normal LV function. Echocardiogram demonstrated EF 55-60%, mild LVH, no RWMA. K was 3.7 on admission. She has not had prior syncope. There is no family history of sudden death, drowning, or syncope. She does have a history of tachypalpitations for which she was placed on Metoprolol. She reports compliance with home medications and no recent fevers, chills, nausea, or diarrhea. She has not had chest pain or shortness of breath. She works at [employer] and does not smoke, drink, or use recreational drugs. EP has been asked to evaluate for treatment options.  Consultants:  Cardiology  Electrophysiology  Critical care    Procedures: *STUDIES:  7/31 Head CT - no acute findings 7/31 Neck CT - no acute findings  SIGNIFICANT EVENTS: 7/31 - Appeared to be having seizure like activity on admission. Neurology consulted. Movements stopped on their own.  8/02 - LHC: Reportedly no obstructing CAD  Antibiotics: Anti-infectives    Start     Dose/Rate Route Frequency Ordered Stop   04/02/15 1600  gentamicin (  GARAMYCIN) 80 mg in sodium chloride irrigation 0.9 % 500 mL irrigation     80 mg Irrigation To Cath Lab 04/01/15 1951 04/03/15 1600   04/02/15 1600  ceFAZolin (ANCEF) IVPB 2 g/50 mL premix     2 g 100 mL/hr over 30 Minutes Intravenous To Cath Lab 04/01/15 1951 04/03/15 1600   04/02/15 1400  ceFAZolin (ANCEF) 2 g  in dextrose 5 % 50 mL IVPB  Status:  Discontinued     2 g 100 mL/hr over 30 Minutes Intravenous To Cath Lab 04/01/15 2027 04/01/15 2057         HPI/Subjective: Still has a slight cough,congested nasally   Objective: Filed Vitals:   04/02/15 1030 04/02/15 1109 04/02/15 2019 04/03/15 0500  BP:  107/64 118/75 114/78  Pulse: 128 120 84 92  Temp:   98.3 F (36.8 C) 98.2 F (36.8 C)  TempSrc:   Oral Oral  Resp:   16 16  Height:      Weight:    96.117 kg (211 lb 14.4 oz)  SpO2: 98%  97% 96%    Intake/Output Summary (Last 24 hours) at 04/03/15 1309 Last data filed at 04/03/15 0000  Gross per 24 hour  Intake   86.8 ml  Output      0 ml  Net   86.8 ml    Exam:  General: Awake. Alert. No distress. Sitting up in bed watching TV and eating breakfast. Integument: Warm & dry. No rash on exposed skin. Bruising right wrist unchanged. HEENT: Moist mucus membranes. No scleral icterus. PERRL. Cardiovascular: Regular rhythm. No edema. No appreciable JVD. Pulmonary: Mild crackles bilateral lung bases. Normal work of breathing on nasal cannula oxygen. Speaking in complete sentences. Abdomen: Soft. Normal bowel sounds. Nondistended.  Neurological: Follows commands. She is aware of the year, president, and season. She is also aware that she is in hospital. She does not remember the events of her code. Musculoskeletal: Chest wall pain to palpation.  Data Review   Micro Results Recent Results (from the past 240 hour(s))  MRSA PCR Screening     Status: None   Collection Time: 03/25/15  8:44 PM  Result Value Ref Range Status   MRSA by PCR NEGATIVE NEGATIVE Final    Comment:        The GeneXpert MRSA Assay (FDA approved for NASAL specimens only), is one component of a comprehensive MRSA colonization surveillance program. It is not intended to diagnose MRSA infection nor to guide or monitor treatment for MRSA infections.     Radiology Reports Dg Abd 1 View  03/25/2015    CLINICAL DATA:  OG tube placement, cardiac arrest  EXAM: ABDOMEN - 1 VIEW  COMPARISON:  CT abdomen pelvis dated 12/30/2014  FINDINGS: Enteric tube is looped in the stomach and terminates in the gastric cardia.  Nonobstructive bowel gas pattern.  Defibrillator pads overlie the lower chest/upper abdomen.  IMPRESSION: Enteric tube is looped in the stomach and terminates in the gastric cardia.   Electronically Signed   By: Julian Hy M.D.   On: 03/25/2015 16:10   Ct Head Wo Contrast  03/25/2015   CLINICAL DATA:  Found on floor at work.  Unresponsive.  EXAM: CT HEAD WITHOUT CONTRAST  CT CERVICAL SPINE WITHOUT CONTRAST  TECHNIQUE: Multidetector CT imaging of the head and cervical spine was performed following the standard protocol without intravenous contrast. Multiplanar CT image reconstructions of the cervical spine were also generated.  COMPARISON:  10/27/2012  FINDINGS: CT HEAD FINDINGS  No acute intracranial abnormality. Specifically, no hemorrhage, hydrocephalus, mass lesion, acute infarction, or significant intracranial injury. No acute calvarial abnormality. Visualized paranasal sinuses and mastoids clear. Orbital soft tissues unremarkable.  CT CERVICAL SPINE FINDINGS  Motion artifact degrades image quality. No fracture visualized. No malalignment. Prevertebral soft tissues are normal. No epidural or paraspinal hematoma.  IMPRESSION: No intracranial abnormality.  No acute bony abnormality in the cervical spine.   Electronically Signed   By: Rolm Baptise M.D.   On: 03/25/2015 15:06   Ct Angio Chest Pe W/cm &/or Wo Cm  04/02/2015   CLINICAL DATA:  Dyspnea.  History of deep venous thrombosis.  EXAM: CT ANGIOGRAPHY CHEST WITH CONTRAST  TECHNIQUE: Multidetector CT imaging of the chest was performed using the standard protocol during bolus administration of intravenous contrast. Multiplanar CT image reconstructions and MIPs were obtained to evaluate the vascular anatomy.  CONTRAST:  76mL OMNIPAQUE IOHEXOL  350 MG/ML SOLN  COMPARISON:  Chest x-ray dated 04/01/2015  FINDINGS: There are no pulmonary emboli. Heart size is normal. No hilar or mediastinal adenopathy. Small amount of calcification in the left anterior descending coronary artery.  Thoracic scoliosis. No acute osseous abnormality. No effusions. 8 mm ground-glass ill-defined abnormality in the right upper lobe. Lungs are otherwise clear. The visualized portion of the upper abdomen is normal.  Review of the MIP images confirms the above findings.  IMPRESSION: 1. No pulmonary emboli. 2. There is an 8 mm ground-glass ill-defined area of abnormal density at the right lung apex best seen on image 25 of series 407. This is indeterminate in etiology.  If the patient is at high risk for bronchogenic carcinoma, follow-up chest CT at 3-39months is recommended. If the patient is at low risk for bronchogenic carcinoma, follow-up chest CT at 6-12 months is recommended. This recommendation follows the consensus statement: Guidelines for Management of Small Pulmonary Nodules Detected on CT Scans: A Statement from the [place] as published in Radiology 2005; 237:395-400.   Electronically Signed   By: Lorriane Shire M.D.   On: 04/02/2015 14:36   Ct Cervical Spine Wo Contrast  03/25/2015   CLINICAL DATA:  Found on floor at work.  Unresponsive.  EXAM: CT HEAD WITHOUT CONTRAST  CT CERVICAL SPINE WITHOUT CONTRAST  TECHNIQUE: Multidetector CT imaging of the head and cervical spine was performed following the standard protocol without intravenous contrast. Multiplanar CT image reconstructions of the cervical spine were also generated.  COMPARISON:  10/27/2012  FINDINGS: CT HEAD FINDINGS  No acute intracranial abnormality. Specifically, no hemorrhage, hydrocephalus, mass lesion, acute infarction, or significant intracranial injury. No acute calvarial abnormality. Visualized paranasal sinuses and mastoids clear. Orbital soft tissues unremarkable.  CT CERVICAL SPINE FINDINGS   Motion artifact degrades image quality. No fracture visualized. No malalignment. Prevertebral soft tissues are normal. No epidural or paraspinal hematoma.  IMPRESSION: No intracranial abnormality.  No acute bony abnormality in the cervical spine.   Electronically Signed   By: Rolm Baptise M.D.   On: 03/25/2015 15:06   Dg Chest Port 1 View  04/03/2015   CLINICAL DATA:  Dyspnea  EXAM: PORTABLE CHEST - 1 VIEW  COMPARISON:  04/01/2015  FINDINGS: The heart size and mediastinal contours are within normal limits. Both lungs are clear. The visualized skeletal structures are unremarkable.  IMPRESSION: No active disease.   Electronically Signed   By: Inez Catalina M.D.   On: 04/03/2015 10:39   Dg Chest Port 1 View  04/01/2015   CLINICAL DATA:  Cough.  History  of hypertension.  Initial encounter.  EXAM: PORTABLE CHEST - 1 VIEW  COMPARISON:  03/25/2015 radiographs.  FINDINGS: 1129 hr. Interval extubation with removal of the nasogastric tube and central line. There is improved aeration of the lung bases which are now clear. The heart size and mediastinal contours are stable. There is no pleural effusion or pneumothorax.  IMPRESSION: No active cardiopulmonary process following extubation.   Electronically Signed   By: Richardean Sale M.D.   On: 04/01/2015 12:22   Dg Chest Port 1 View  03/25/2015   CLINICAL DATA:  Central line placement.  Hypothermia.  EXAM: PORTABLE CHEST - 1 VIEW  COMPARISON:  03/25/2015.  FINDINGS: Support apparatus: Endotracheal tube tip 1 cm from the carina. Enteric tube is present doubled back upon itself with the tip at the cardia of the stomach.  New LEFT subclavian central line is present. The tip is in the lower superior vena cava at the cavoatrial junction.  Cardiomediastinal Silhouette:  Unchanged.  Lungs: Opacity at the LEFT costophrenic angle, likely representing atelectasis. Airspace disease could also produce this appearance. No pneumothorax.  Effusions:  None.  Other:  None.  IMPRESSION: 1.  Support apparatus as described above. The endotracheal tube has been retracted but remains 1 cm from the carina. 2. Uncomplicated interval placement of LEFT subclavian central line with the tip at the junction of the superior vena cava and RIGHT atrium. 3. Opacification at the LEFT costophrenic angle which may represent atelectasis or airspace disease.   Electronically Signed   By: Dereck Ligas M.D.   On: 03/25/2015 21:01   Dg Chest Port 1 View  03/25/2015   CLINICAL DATA:  Intubation, cardiac arrest.  EXAM: PORTABLE CHEST - 1 VIEW  COMPARISON:  01/23/2015  FINDINGS: Endotracheal tube has tip in the origin of the left mainstem bronchus. This could be pulled back approximately 3 cm. Nasogastric tube is coiled once over the stomach with tip and side-port over the stomach in the left upper quadrant.  Lungs are hypoinflated without focal consolidation or effusion. Cardiomediastinal silhouette is within normal. Remainder of the exam is unchanged.  IMPRESSION: Hypoinflation without acute cardiopulmonary disease.  Tubes and lines as described. Note that the endotracheal tube has tip over the origin of the left mainstem bronchus.  These results were called by telephone at the time of interpretation on 03/25/2015 at 4:03 pm to Dr. Thomasene Lot, who verbally acknowledged these results.   Electronically Signed   By: Marin Olp M.D.   On: 03/25/2015 16:03   Mr Card Morphology Wo/w Cm  03/30/2015   CLINICAL DATA:  49 year old female post cardiac arrest. Normal cardiac catheterization. Evaluate for myocarditis and ARVC.  EXAM: CARDIAC MRI  TECHNIQUE: The patient was scanned on a 1.5 Tesla GE magnet. A dedicated cardiac coil was used. Functional imaging was done using Fiesta sequences. 2,3, and 4 chamber views were done to assess for RWMA's. Modified Simpson's rule using a short axis stack was used to calculate an ejection fraction on a dedicated work Conservation officer, nature. The patient received 32 cc of Multihance.  After 10 minutes inversion recovery sequences were used to assess for infiltration and scar tissue.  CONTRAST:  32 cc  of Multihance  FINDINGS: 1. Normal left ventricular size, thickness and systolic function (LVEF = 65%). There is of the apical inferior wall.  LVEDD:  49 mm  LVESD:  31 mm  LVEDV:  111 ml  LVESV:  38 ml  SV:  72 ml  LV  CO:  5.9 L/minute  Myocardial mass:  101 g  2. Normal right ventricular size, thickness and systolic function (LVEF = 63%). There are no regional wall motion abnormalities.  RVEDV:  114 ml  RVESV:  42 ml  SV:  72 ml  RV CO:  5.9 L/minute  3.  Mild left atrial dilatation.  4.  Mild mitral and mild tricuspid regurgitation.  5. Normal size of the aorta and main pulmonary artery.  6. There is late subendocardial late gadolinium enhancement in the apical inferior wall.  IMPRESSION: 1. Normal left ventricular size, thickness and systolic function (LVEF = 65%) with hypokinesis of the apical inferior wall.  2. Normal right ventricular size, thickness and systolic function (LVEF = 63%) with no regional wall motion abnormalities.  3.  Mild left atrial dilatation.  4.  Mild mitral and mild tricuspid regurgitation.  5. A subendocardial late gadolinium enhancement in the apical inferior wall is consistent with prior subendocardial infarct.  6.  There is no evidence for ARVC.  Ena Dawley   Electronically Signed   By: Ena Dawley   On: 03/30/2015 18:34     CBC  Recent Labs Lab 03/30/15 0530 03/30/15 1400 03/31/15 0445 04/01/15 0339 04/02/15 0415 04/03/15 0254  WBC 7.7  --  7.5 6.9 7.5 8.6  HGB 9.5* 10.2* 10.0* 10.5* 11.5* 11.7*  HCT 28.9* 31.6* 31.4* 32.6* 35.4* 36.5  PLT 254  --  273 297 300 344  MCV 93.2  --  93.2 93.1 93.4 94.1  MCH 30.6  --  29.7 30.0 30.3 30.2  MCHC 32.9  --  31.8 32.2 32.5 32.1  RDW 14.7  --  14.6 14.4 14.6 14.5    Chemistries   Recent Labs Lab 03/30/15 0530 03/31/15 0445 04/01/15 0339 04/02/15 0415 04/03/15 0254  NA 143 139 141 138  136  K 3.0* 3.6 3.7 3.8 3.9  CL 111 107 106 101 98*  CO2 26 26 28 28 30   GLUCOSE 145* 100* 121* 106* 115*  BUN 5* 7 8 7 12   CREATININE 0.63 0.58 0.52 0.50 0.62  CALCIUM 7.8* 8.1* 8.7* 8.7* 8.6*  MG 2.0 1.8 1.9 2.1 1.9  AST  --   --  37  --  66*  ALT  --   --  34  --  63*  ALKPHOS  --   --  63  --  92  BILITOT  --   --  0.2*  --  0.5   ------------------------------------------------------------------------------------------------------------------ estimated creatinine clearance is 95.9 mL/min (by C-G formula based on Cr of 0.62). ------------------------------------------------------------------------------------------------------------------ No results for input(s): HGBA1C in the last 72 hours. ------------------------------------------------------------------------------------------------------------------ No results for input(s): CHOL, HDL, LDLCALC, TRIG, CHOLHDL, LDLDIRECT in the last 72 hours. ------------------------------------------------------------------------------------------------------------------ No results for input(s): TSH, T4TOTAL, T3FREE, THYROIDAB in the last 72 hours.  Invalid input(s): FREET3 ------------------------------------------------------------------------------------------------------------------ No results for input(s): VITAMINB12, FOLATE, FERRITIN, TIBC, IRON, RETICCTPCT in the last 72 hours.  Coagulation profile No results for input(s): INR, PROTIME in the last 168 hours.   Recent Labs  04/02/15 1213  DDIMER 2.77*    Cardiac Enzymes No results for input(s): CKMB, TROPONINI, MYOGLOBIN in the last 168 hours.  Invalid input(s): CK ------------------------------------------------------------------------------------------------------------------ Invalid input(s): POCBNP   CBG:  Recent Labs Lab 04/02/15 1150 04/02/15 1700 04/02/15 2231 04/03/15 0735 04/03/15 1143  GLUCAP 116* 124* 95 85 92       Studies: Ct Angio Chest Pe W/cm  &/or Wo Cm  04/02/2015   CLINICAL DATA:  Dyspnea.  History of deep venous thrombosis.  EXAM: CT ANGIOGRAPHY CHEST WITH CONTRAST  TECHNIQUE: Multidetector CT imaging of the chest was performed using the standard protocol during bolus administration of intravenous contrast. Multiplanar CT image reconstructions and MIPs were obtained to evaluate the vascular anatomy.  CONTRAST:  41mL OMNIPAQUE IOHEXOL 350 MG/ML SOLN  COMPARISON:  Chest x-ray dated 04/01/2015  FINDINGS: There are no pulmonary emboli. Heart size is normal. No hilar or mediastinal adenopathy. Small amount of calcification in the left anterior descending coronary artery.  Thoracic scoliosis. No acute osseous abnormality. No effusions. 8 mm ground-glass ill-defined abnormality in the right upper lobe. Lungs are otherwise clear. The visualized portion of the upper abdomen is normal.  Review of the MIP images confirms the above findings.  IMPRESSION: 1. No pulmonary emboli. 2. There is an 8 mm ground-glass ill-defined area of abnormal density at the right lung apex best seen on image 25 of series 407. This is indeterminate in etiology.  If the patient is at high risk for bronchogenic carcinoma, follow-up chest CT at 3-18months is recommended. If the patient is at low risk for bronchogenic carcinoma, follow-up chest CT at 6-12 months is recommended. This recommendation follows the consensus statement: Guidelines for Management of Small Pulmonary Nodules Detected on CT Scans: A Statement from the [place] as published in Radiology 2005; 237:395-400.   Electronically Signed   By: Lorriane Shire M.D.   On: 04/02/2015 14:36   Dg Chest Port 1 View  04/03/2015   CLINICAL DATA:  Dyspnea  EXAM: PORTABLE CHEST - 1 VIEW  COMPARISON:  04/01/2015  FINDINGS: The heart size and mediastinal contours are within normal limits. Both lungs are clear. The visualized skeletal structures are unremarkable.  IMPRESSION: No active disease.   Electronically Signed   By: Inez Catalina M.D.   On: 04/03/2015 10:39      No results found for: HGBA1C Lab Results  Component Value Date   CREATININE 0.62 04/03/2015       Scheduled Meds: . albuterol  2.5 mg Nebulization BID  . aspirin  81 mg Oral Daily  . atorvastatin  80 mg Oral q1800  .  ceFAZolin (ANCEF) IV  2 g Intravenous To Cath  . chlorhexidine  60 mL Topical Once  . chlorhexidine  60 mL Topical Once  . coumadin book   Does not apply Once  . fluticasone  1 spray Each Nare Daily  . gentamicin irrigation  80 mg Irrigation To Cath  . heparin  5,000 Units Intravenous Once  . levETIRAcetam  500 mg Oral BID  . magnesium oxide  400 mg Oral Daily  . metoprolol tartrate  12.5 mg Oral BID  . warfarin  7.5 mg Oral ONCE-1800  . warfarin   Does not apply Once  . Warfarin - Pharmacist Dosing Inpatient   Does not apply q1800   Continuous Infusions: . sodium chloride 50 mL/hr at 04/02/15 0635  . sodium chloride 50 mL/hr at 04/02/15 0636  . heparin 1,200 Units/hr (04/03/15 0948)    Principal Problem:   Cardiac arrest Active Problems:   Status epilepticus   Seizure   Essential hypertension   Hyperlipemia   Cough   Dyspnea   Acute pulmonary embolism   DVT (deep venous thrombosis)    Time spent: 45 minutes   [facility] Pager [phone]. If 7PM-7AM, please contact night-coverage at [email or web address], password [facility] 04/03/2015, 1:09 PM  LOS: 9 days

## 2015-04-03 NOTE — Progress Notes (Signed)
Name: [facility] MRN: [id number] DOB: 01-05-1966    ADMISSION DATE:  03/25/2015 CONSULTATION DATE:  04/02/15  REFERRING MD :  Dr. Allyson Sabal  CHIEF COMPLAINT:  SOB  BRIEF PATIENT DESCRIPTION:  49 y/o F admitted 7/31 with witnessed seizure like activity.  EMS evaluation advised shock x2, intubated and transferred to [facility] for further evaluation.  She underwent cardiac cath on 8/2 which showed mild non-obstructive CAD and normal LV function.  PCCM called back am of 8/8 for dyspnea.    SIGNIFICANT EVENTS  7/31  Appeared to be having seizure like activity on admission. Neurology consulted. Movements stopped on their own.  8/02  LHC 8/05  Cardiac MRI  8/08  Planned ICD placement but held due to SOB  STUDIES:  7/31 Head CT - no acute findings 7/31 Neck CT - no acute findings 8/02 LHC - proximal LAD lesion 10%, mid LAD 30% stenosed, nml LV function, EF 50-55% 8/05 Cardiac MRI: A subendocardial late gadolinium enhancement in the apical inferior wall is consistent with prior subendocardial infarct. 8/08  CTA Chest >> neg for PE, 8 mm ground glass density at R lung apex 8/08  Venous Duplex LE >> POSITIVE acute DVT in the R proximal and mid peroneal veins in RLE   SUBJECTIVE:  Pt reports no acute complaints.  Attempting to be more independent of ADL's  VITAL SIGNS: Temp:  [98.2 F (36.8 C)-98.3 F (36.8 C)] 98.2 F (36.8 C) (08/09 0500) Pulse Rate:  [84-128] 92 (08/09 0500) Resp:  [16] 16 (08/09 0500) BP: (107-118)/(64-78) 114/78 mmHg (08/09 0500) SpO2:  [96 %-98 %] 96 % (08/09 0500) Weight:  [211 lb 14.4 oz (96.117 kg)] 211 lb 14.4 oz (96.117 kg) (08/09 0500)  PHYSICAL EXAMINATION: General:  Morbidly obese female in NAD  Neuro:  AAOx4, MAE, speech clear  HEENT:  MM pink/moist, no jvd  Cardiovascular:  s1s2 rrr,tachy,  no m/r/g Lungs:  Even/non-labored at rest, lungs bilaterally clear. Exertional dyspnea  Abdomen:  Obese,soft, bsx4 active  Musculoskeletal:  No acute deformities    Skin:  Warm/dry, BLE 1-2+ pitting edema, TTP posterior RLE   Recent Labs Lab 04/01/15 0339 04/02/15 0415 04/03/15 0254  NA 141 138 136  K 3.7 3.8 3.9  CL 106 101 98*  CO2 28 28 30   BUN 8 7 12   CREATININE 0.52 0.50 0.62  GLUCOSE 121* 106* 115*    Recent Labs Lab 04/01/15 0339 04/02/15 0415 04/03/15 0254  HGB 10.5* 11.5* 11.7*  HCT 32.6* 35.4* 36.5  WBC 6.9 7.5 8.6  PLT 297 300 344   Ct Angio Chest Pe W/cm &/or Wo Cm  04/02/2015   CLINICAL DATA:  Dyspnea.  History of deep venous thrombosis.  EXAM: CT ANGIOGRAPHY CHEST WITH CONTRAST  TECHNIQUE: Multidetector CT imaging of the chest was performed using the standard protocol during bolus administration of intravenous contrast. Multiplanar CT image reconstructions and MIPs were obtained to evaluate the vascular anatomy.  CONTRAST:  72mL OMNIPAQUE IOHEXOL 350 MG/ML SOLN  COMPARISON:  Chest x-ray dated 04/01/2015  FINDINGS: There are no pulmonary emboli. Heart size is normal. No hilar or mediastinal adenopathy. Small amount of calcification in the left anterior descending coronary artery.  Thoracic scoliosis. No acute osseous abnormality. No effusions. 8 mm ground-glass ill-defined abnormality in the right upper lobe. Lungs are otherwise clear. The visualized portion of the upper abdomen is normal.  Review of the MIP images confirms the above findings.  IMPRESSION: 1. No pulmonary emboli. 2. There is an  8 mm ground-glass ill-defined area of abnormal density at the right lung apex best seen on image 25 of series 407. This is indeterminate in etiology.  If the patient is at high risk for bronchogenic carcinoma, follow-up chest CT at 3-18months is recommended. If the patient is at low risk for bronchogenic carcinoma, follow-up chest CT at 6-12 months is recommended. This recommendation follows the consensus statement: Guidelines for Management of Small Pulmonary Nodules Detected on CT Scans: A Statement from the [place] as published in  Radiology 2005; 237:395-400.   Electronically Signed   By: Lorriane Shire M.D.   On: 04/02/2015 14:36   Dg Chest Port 1 View  04/01/2015   CLINICAL DATA:  Cough.  History of hypertension.  Initial encounter.  EXAM: PORTABLE CHEST - 1 VIEW  COMPARISON:  03/25/2015 radiographs.  FINDINGS: 1129 hr. Interval extubation with removal of the nasogastric tube and central line. There is improved aeration of the lung bases which are now clear. The heart size and mediastinal contours are stable. There is no pleural effusion or pneumothorax.  IMPRESSION: No active cardiopulmonary process following extubation.   Electronically Signed   By: Richardean Sale M.D.   On: 04/01/2015 12:22    ASSESSMENT / PLAN:  Dyspnea - patient admitted with syncopal event with shock x2.  Essentially negative LHC with normal LVEF.  Patient continues to complain of dyspnea.  Pacemaker insertion canceled for am of 8/8 due to SOB.  Room air saturations 94% on 8/8 assessment. CXR essentially clear from 8/7 am.   She reports a history of "blood clots in her leg but was never treated with anticoagulation".  Suspect likely an element of deconditioning and morbid obesity.  PE ruled out 8/8. DVT POSITIVE RLE.  She is a life long non-smoker.   Cough - minimal clear to green sputum production.  No difficulty with swallowing etc.  Prior intubation this admit.    Pulmonary Edema - lasix on hold with recent contrast administration   Deconditioning - in setting of morbid obesity, critical illness   Nodular/Rounded Ground Glass Density - 8 mm, RUL  Plan: Hold lasix (contrast administration on 8/8), consider restart 8/10 pending sr cr review Heparin gtt with conversion to oral anticoagulation of choice (coumadin vs xarelto etc).   She will need a minimum of 3 months therapy for DVT.   Oxygen as needed to support saturations > 92% PT evaluation Consider outpatient PFT's to assess for restrictive disease in the setting of morbid obesity.     Incentive spirometry / pulmonary hygiene Cough suppression per primary  Repeat CXR now to ensure no developing infiltrate  Pt will need follow up CT of the chest in 3 months to review RUL density     PCCM will be available PRN.  Please call back if new needs arise.    Noe Gens, NP-C [facility] Pgr: [phone] or if no answer [phone] 04/03/2015, 8:27 AM

## 2015-04-03 NOTE — Progress Notes (Signed)
UR Completed Leisl Spurrier Graves-Bigelow, RN,BSN [phone]  

## 2015-04-03 NOTE — Progress Notes (Signed)
ANTICOAGULATION CONSULT NOTE - Follow Up Consult  Pharmacy Consult for Heparin  Indication: DVT, RLE  Allergies  Allergen Reactions  . Toradol [Ketorolac Tromethamine] Itching and Rash  . Tramadol Itching and Rash  . Zofran [Ondansetron Hcl] Itching and Rash    Patient Measurements: Height: 5\' 3"  (160 cm) Weight: 212 lb 6.4 oz (96.344 kg) IBW/kg (Calculated) : 52.4 Vital Signs: Temp: 98.3 F (36.8 C) (08/08 2019) Temp Source: Oral (08/08 2019) BP: 118/75 mmHg (08/08 2019) Pulse Rate: 84 (08/08 2019)  Labs:  Recent Labs  03/31/15 0445 04/01/15 0339 04/02/15 0415 04/03/15 0254  HGB 10.0* 10.5* 11.5* 11.7*  HCT 31.4* 32.6* 35.4* 36.5  PLT 273 297 300 344  HEPARINUNFRC  --   --   --  0.48  CREATININE 0.58 0.52 0.50  --     Estimated Creatinine Clearance: 96.1 mL/min (by C-G formula based on Cr of 0.5).  Assessment: Heparin for new-onset DVT, initial heparin level is therapeutic at 0.48  Goal of Therapy:  Heparin level 0.3-0.7 units/ml Monitor platelets by anticoagulation protocol: Yes   Plan:  -Continue heparin at 1200 units/hr -1000 confirmatory HL  -Daily CBC/HL -Monitor for bleeding -F/U start of oral AC  [facility] 04/03/2015,3:21 AM

## 2015-04-03 NOTE — Evaluation (Signed)
Clinical/Bedside Swallow Evaluation Patient Details  Name: Claire M[facility] MRN: [id number] Date of Birth: [birth year]  Today's Date: 04/03/2015 Time: SLP Start Time (ACUTE ONLY): 1000 SLP Stop Time (ACUTE ONLY): 1010 SLP Time Calculation (min) (ACUTE ONLY): 10 min  Past Medical History:  Past Medical History  Diagnosis Date  . Hypertension   . High cholesterol   . Anxiety   . Depression   . Allergy   . GERD (gastroesophageal reflux disease)   . Migraine   . Diverticulosis     seen on CT 2015  . Obesity (BMI 30-39.9)    Past Surgical History:  Past Surgical History  Procedure Laterality Date  . Tubal ligation  1998  . Ovarian cyst removal Left   . Central line  03/25/2015       . Hand surgery Left   . Cardiac catheterization N/A 03/27/2015    Procedure: Left Heart Cath and Coronary Angiography;  Surgeon: Claire Sine, MD;  Location: [facility];  Service: Cardiovascular;  Laterality: N/A;   HPI:  Claire Mcdonald is 49 y/o woman with past medical history of hypertension, chronic pain and depression who is on gabapentin and prn topomax. She was working on evening of admit when her coworkers called EMS to report seizure like activity.She was shocked twice and a King airway placed. She was noted to again have seizure like activity in [facility] and in [facility]. She was extubated on 8/4.CXR on 8/7 shows no active disease.    Assessment / Plan / Recommendation Clinical Impression  Pt demonstrates normal swallow function with no signs of aspiration. Discussed basic precautions including upright posture. No SLP f/u needed, continue current diet.     Aspiration Risk       Diet Recommendation Thin;Age appropriate regular solids   Medication Administration: Whole meds with liquid    Other  Recommendations     Follow Up Recommendations       Frequency and Duration        Pertinent Vitals/Pain NA    SLP Swallow Goals     Swallow Study Prior Functional Status        General Other Pertinent Information: [name] is 49 y/o woman with past medical history of hypertension, chronic pain and depression who is on gabapentin and prn topomax. She was working on evening of admit when her coworkers called EMS to report seizure like activity.She was shocked twice and a King airway placed. She was noted to again have seizure like activity in [facility] and in [facility]. She was extubated on 8/4.CXR on 8/7 shows no active disease.  Type of Study: Bedside swallow evaluation Diet Prior to this Study: Regular;Thin liquids Temperature Spikes Noted: No Respiratory Status: Room air History of Recent Intubation: Yes Length of Intubations (days): 5 days Date extubated: 03/29/15 Behavior/Cognition: Alert;Cooperative Oral Cavity - Dentition: Adequate natural dentition/normal for age Self-Feeding Abilities: Able to feed self Patient Positioning: Partially reclined Baseline Vocal Quality: Hoarse Volitional Cough: Strong Volitional Swallow: Able to elicit    Oral/Motor/Sensory Function Overall Oral Motor/Sensory Function: Appears within functional limits for tasks assessed   Ice Chips     Thin Liquid Thin Liquid: Within functional limits Presentation: Cup;Straw;Self Fed    Nectar Thick     Honey Thick     Puree Puree: Within functional limits   Solid   GO    Solid: Within functional limits      [facility], [facility] [phone]  [facility], [facility] 04/03/2015,2:42 PM

## 2015-04-03 NOTE — Progress Notes (Signed)
CARDIAC REHAB PHASE I   PRE:  Rate/Rhythm: 94 SR  BP:  Supine:   Sitting: 102/80  Standing:    SaO2: 96%RA  MODE:  Ambulation: 150 ft   POST:  Rate/Rhythm: 91  BP:  Supine:   Sitting: 110/80  Standing:    SaO2: 99%RA 1450-1520 Pt walked 150 ft on RA with gait belt use, rolling walker and asst x 1 with slow steady gait. Pt requested to go back to bed as she had been sitting up a while. Assisted to bed with call bell. Son in room. Tolerated well. No complaints except soreness in chest from compressions.   Graylon Good, RN BSN  04/03/2015 3:19 PM

## 2015-04-04 ENCOUNTER — Encounter (HOSPITAL_COMMUNITY): Payer: Self-pay | Admitting: Internal Medicine

## 2015-04-04 ENCOUNTER — Encounter (HOSPITAL_COMMUNITY)
Admission: AD | Disposition: A | Discharge status: Home / Self Care | Payer: Self-pay | Source: Other Acute Inpatient Hospital | Attending: Internal Medicine

## 2015-04-04 LAB — RENAL FUNCTION PANEL
ALBUMIN: 2.8 g/dL — AB (ref 3.5–5.0)
Anion gap: 6 (ref 5–15)
BUN: 15 mg/dL (ref 6–20)
CO2: 31 mmol/L (ref 22–32)
Calcium: 8.7 mg/dL — ABNORMAL LOW (ref 8.9–10.3)
Chloride: 103 mmol/L (ref 101–111)
Creatinine, Ser: 0.68 mg/dL (ref 0.44–1.00)
GFR calc non Af Amer: >60 mL/min (ref >60)
Glucose, Bld: 102 mg/dL — ABNORMAL HIGH (ref 65–99)
PHOSPHORUS: 4.1 mg/dL (ref 2.5–4.6)
POTASSIUM: 3.9 mmol/L (ref 3.5–5.1)
SODIUM: 140 mmol/L (ref 135–145)

## 2015-04-04 LAB — PROTIME-INR
INR: 1.01 (ref 0.00–1.49)
Prothrombin Time: 13.5 seconds (ref 11.6–15.2)

## 2015-04-04 LAB — CBC
HEMATOCRIT: 35.8 % — AB (ref 36.0–46.0)
Hemoglobin: 11.5 g/dL — ABNORMAL LOW (ref 12.0–15.0)
MCH: 30.6 pg (ref 26.0–34.0)
MCHC: 32.1 g/dL (ref 30.0–36.0)
MCV: 95.2 fL (ref 78.0–100.0)
Platelets: 376 10*3/uL (ref 150–400)
RBC: 3.76 MIL/uL — AB (ref 3.87–5.11)
RDW: 14.6 % (ref 11.5–15.5)
WBC: 8 10*3/uL (ref 4.0–10.5)

## 2015-04-04 LAB — GLUCOSE, CAPILLARY
GLUCOSE-CAPILLARY: 94 mg/dL (ref 65–99)
Glucose-Capillary: 91 mg/dL (ref 65–99)
Glucose-Capillary: 88 mg/dL (ref 65–99)
Glucose-Capillary: 98 mg/dL (ref 65–99)

## 2015-04-04 LAB — MAGNESIUM: MAGNESIUM: 1.9 mg/dL (ref 1.7–2.4)

## 2015-04-04 LAB — HEPARIN LEVEL (UNFRACTIONATED): Heparin Unfractionated: 0.45 IU/mL (ref 0.30–0.70)

## 2015-04-04 SURGERY — ICD IMPLANT

## 2015-04-04 MED ORDER — WARFARIN SODIUM 7.5 MG PO TABS
7.5000 mg | ORAL_TABLET | Freq: Once | ORAL | Status: AC
  Administered 2015-04-04: 7.5 mg via ORAL
  Filled 2015-04-04: qty 1

## 2015-04-04 MED ORDER — CIPROFLOXACIN-DEXAMETHASONE 0.3-0.1 % OT SUSP
4.0000 [drp] | Freq: Two times a day (BID) | OTIC | Status: AC
  Administered 2015-04-04 – 2015-04-05 (×3): 4 [drp] via OTIC
  Filled 2015-04-04: qty 7.5

## 2015-04-04 MED ORDER — CARBAMIDE PEROXIDE 6.5 % OT SOLN
5.0000 [drp] | Freq: Two times a day (BID) | OTIC | Status: DC
  Administered 2015-04-04 – 2015-04-11 (×16): 5 [drp] via OTIC
  Filled 2015-04-04: qty 15

## 2015-04-04 MED ORDER — ENOXAPARIN SODIUM 150 MG/ML ~~LOC~~ SOLN
1.5000 mg/kg | SUBCUTANEOUS | Status: DC
  Administered 2015-04-04 – 2015-04-11 (×8): 145 mg via SUBCUTANEOUS
  Filled 2015-04-04 (×8): qty 1

## 2015-04-04 NOTE — Progress Notes (Signed)
Pt tolerating diet this am, ate 100% of chicken noodle soup crackers and sprite.

## 2015-04-04 NOTE — Progress Notes (Signed)
[facility] PROGRESS NOTE  Claire Mcdonald ATF:[id number] DOB: 05-04-1966 DOA: 03/25/2015 PCP: Golden Pop, MD  Assessment/Plan: Principal Problem:   Cardiac arrest Active Problems:   Status epilepticus   Seizure   Essential hypertension   Hyperlipemia   Cough   Dyspnea   DVT (deep venous thrombosis)    Resuscitated VF arrest-patient has had a resuscitated arrest. Cardiac catheterization with non obstructive CAD and normal LV function. There is no family history of sudden death or syncope. Cardiac MRI with prior subendocardial infarct.  Flecanide challenge by  Dr Caryl Comes - completed Keep K>3.9, Mg >1.8. No driving x6 months. Patient will ultimately need an ICD, for secondary prevention . This is anticipated to be done when INR is therapeutic per Dr Olin Pia note.  Patient currently on Lovenox and coumadin per cards recommendations    Cough, productive of green sputum, no difficulty swallowing, Chest x-ray reviewed-no infiltrates Coarse breath sounds but no rhonchi, CT angiogram reviewed, negative PE, does have proximal right lower extremity DVT and needs anticoagulation for at least 3 months. She is clear from pulmonary standpoint to proceed with ICD placement. Pulmonary nodule -Will need follow-up CT in 6 months in this never smoker, low risk for malignancy Continue Chloraseptic spray,  Hycodan cough syrup, flonase and ocean nasal spray  Speech therapy recommends regular diet with thin liquids   DVT Patient switched to Lovenox, heparin drip discontinued, initiated on coumadin   Endotracheal intubation for airway protection status post   cardiac arrest Now extubated, on room air  Dyslipidemia-continue statin  Hypertension-continue metoprolol  Hypokalemia keep potassium greater than 4.0  Status epilepticus-seizure disorder, seen by neurology, continue Keppra , EEG w/o seizure activity   Code Status:      Code Status Orders        Start     Ordered    03/27/15 1450  Full code   Continuous     03/27/15 1454     Family Communication: family updated about patient's clinical progress Disposition Plan:  ICD placement with INR therapeutic   Brief narrative: 49 y.o. female with a past medical history of hypertension, hyperlipidemia, anxiety, depression, and obesity. She was at work on the day of admission when she collapsed. AED was applied and advised shock x2. Upon EMS arrival, she was intubated and transferred to [facility]. She was transferred to [facility] for further evaluation. She has been extubated and is now awake and alert. She does not remember her arrest. Cardiac catheterization demonstrated mild non obstructive CAD and normal LV function. Echocardiogram demonstrated EF 55-60%, mild LVH, no RWMA. K was 3.7 on admission. She has not had prior syncope. There is no family history of sudden death, drowning, or syncope. She does have a history of tachypalpitations for which she was placed on Metoprolol. She reports compliance with home medications and no recent fevers, chills, nausea, or diarrhea. She has not had chest pain or shortness of breath. She works at [employer] and does not smoke, drink, or use recreational drugs. EP has been asked to evaluate for treatment options.  Consultants:  Cardiology  Electrophysiology  Critical care    Procedures: *STUDIES:  7/31 Head CT - no acute findings 7/31 Neck CT - no acute findings  SIGNIFICANT EVENTS: 7/31 - Appeared to be having seizure like activity on admission. Neurology consulted. Movements stopped on their own.  8/02 - LHC: Reportedly no obstructing CAD  Antibiotics: Anti-infectives    Start     Dose/Rate Route  Frequency Ordered Stop   04/02/15 1600  gentamicin (GARAMYCIN) 80 mg in sodium chloride irrigation 0.9 % 500 mL irrigation     80 mg Irrigation To Cath Lab 04/01/15 1951 04/03/15 1600   04/02/15 1600  ceFAZolin (ANCEF) IVPB 2 g/50 mL premix     2 g 100 mL/hr over 30  Minutes Intravenous To Cath Lab 04/01/15 1951 04/03/15 1600   04/02/15 1400  ceFAZolin (ANCEF) 2 g in dextrose 5 % 50 mL IVPB  Status:  Discontinued     2 g 100 mL/hr over 30 Minutes Intravenous To Cath Lab 04/01/15 2027 04/01/15 2057         HPI/Subjective: Still has a slight cough,congested nasally   Objective: Filed Vitals:   04/03/15 1311 04/03/15 1500 04/03/15 2028 04/04/15 0500  BP:  120/76 119/71 106/78  Pulse: 117 90 88   Temp:  98.1 F (36.7 C) 98.3 F (36.8 C) 98.2 F (36.8 C)  TempSrc:  Oral Oral Oral  Resp:  16 16 16   Height:      Weight:    95.89 kg (211 lb 6.4 oz)  SpO2: 98% 98% 100% 97%    Intake/Output Summary (Last 24 hours) at 04/04/15 1144 Last data filed at 04/04/15 0500  Gross per 24 hour  Intake      0 ml  Output    300 ml  Net   -300 ml    Exam:  General: Awake. Alert. No distress. Sitting up in bed watching TV and eating breakfast. Integument: Warm & dry. No rash on exposed skin. Bruising right wrist unchanged. HEENT: Moist mucus membranes. No scleral icterus. PERRL. Cardiovascular: Regular rhythm. No edema. No appreciable JVD. Pulmonary: Mild crackles bilateral lung bases. Normal work of breathing on nasal cannula oxygen. Speaking in complete sentences. Abdomen: Soft. Normal bowel sounds. Nondistended.  Neurological: Follows commands. She is aware of the year, president, and season. She is also aware that she is in hospital. She does not remember the events of her code. Musculoskeletal: Chest wall pain to palpation.  Data Review   Micro Results Recent Results (from the past 240 hour(s))  MRSA PCR Screening     Status: None   Collection Time: 03/25/15  8:44 PM  Result Value Ref Range Status   MRSA by PCR NEGATIVE NEGATIVE Final    Comment:        The GeneXpert MRSA Assay (FDA approved for NASAL specimens only), is one component of a comprehensive MRSA colonization surveillance program. It is not intended to diagnose  MRSA infection nor to guide or monitor treatment for MRSA infections.     Radiology Reports Dg Abd 1 View  03/25/2015   CLINICAL DATA:  OG tube placement, cardiac arrest  EXAM: ABDOMEN - 1 VIEW  COMPARISON:  CT abdomen pelvis dated 12/30/2014  FINDINGS: Enteric tube is looped in the stomach and terminates in the gastric cardia.  Nonobstructive bowel gas pattern.  Defibrillator pads overlie the lower chest/upper abdomen.  IMPRESSION: Enteric tube is looped in the stomach and terminates in the gastric cardia.   Electronically Signed   By: Julian Hy M.D.   On: 03/25/2015 16:10   Ct Head Wo Contrast  03/25/2015   CLINICAL DATA:  Found on floor at work.  Unresponsive.  EXAM: CT HEAD WITHOUT CONTRAST  CT CERVICAL SPINE WITHOUT CONTRAST  TECHNIQUE: Multidetector CT imaging of the head and cervical spine was performed following the standard protocol without intravenous contrast. Multiplanar CT image reconstructions of the cervical  spine were also generated.  COMPARISON:  10/27/2012  FINDINGS: CT HEAD FINDINGS  No acute intracranial abnormality. Specifically, no hemorrhage, hydrocephalus, mass lesion, acute infarction, or significant intracranial injury. No acute calvarial abnormality. Visualized paranasal sinuses and mastoids clear. Orbital soft tissues unremarkable.  CT CERVICAL SPINE FINDINGS  Motion artifact degrades image quality. No fracture visualized. No malalignment. Prevertebral soft tissues are normal. No epidural or paraspinal hematoma.  IMPRESSION: No intracranial abnormality.  No acute bony abnormality in the cervical spine.   Electronically Signed   By: Rolm Baptise M.D.   On: 03/25/2015 15:06   Ct Angio Chest Pe W/cm &/or Wo Cm  04/02/2015   CLINICAL DATA:  Dyspnea.  History of deep venous thrombosis.  EXAM: CT ANGIOGRAPHY CHEST WITH CONTRAST  TECHNIQUE: Multidetector CT imaging of the chest was performed using the standard protocol during bolus administration of intravenous contrast.  Multiplanar CT image reconstructions and MIPs were obtained to evaluate the vascular anatomy.  CONTRAST:  17mL OMNIPAQUE IOHEXOL 350 MG/ML SOLN  COMPARISON:  Chest x-ray dated 04/01/2015  FINDINGS: There are no pulmonary emboli. Heart size is normal. No hilar or mediastinal adenopathy. Small amount of calcification in the left anterior descending coronary artery.  Thoracic scoliosis. No acute osseous abnormality. No effusions. 8 mm ground-glass ill-defined abnormality in the right upper lobe. Lungs are otherwise clear. The visualized portion of the upper abdomen is normal.  Review of the MIP images confirms the above findings.  IMPRESSION: 1. No pulmonary emboli. 2. There is an 8 mm ground-glass ill-defined area of abnormal density at the right lung apex best seen on image 25 of series 407. This is indeterminate in etiology.  If the patient is at high risk for bronchogenic carcinoma, follow-up chest CT at 3-52months is recommended. If the patient is at low risk for bronchogenic carcinoma, follow-up chest CT at 6-12 months is recommended. This recommendation follows the consensus statement: Guidelines for Management of Small Pulmonary Nodules Detected on CT Scans: A Statement from the [place] as published in Radiology 2005; 237:395-400.   Electronically Signed   By: Lorriane Shire M.D.   On: 04/02/2015 14:36   Ct Cervical Spine Wo Contrast  03/25/2015   CLINICAL DATA:  Found on floor at work.  Unresponsive.  EXAM: CT HEAD WITHOUT CONTRAST  CT CERVICAL SPINE WITHOUT CONTRAST  TECHNIQUE: Multidetector CT imaging of the head and cervical spine was performed following the standard protocol without intravenous contrast. Multiplanar CT image reconstructions of the cervical spine were also generated.  COMPARISON:  10/27/2012  FINDINGS: CT HEAD FINDINGS  No acute intracranial abnormality. Specifically, no hemorrhage, hydrocephalus, mass lesion, acute infarction, or significant intracranial injury. No acute  calvarial abnormality. Visualized paranasal sinuses and mastoids clear. Orbital soft tissues unremarkable.  CT CERVICAL SPINE FINDINGS  Motion artifact degrades image quality. No fracture visualized. No malalignment. Prevertebral soft tissues are normal. No epidural or paraspinal hematoma.  IMPRESSION: No intracranial abnormality.  No acute bony abnormality in the cervical spine.   Electronically Signed   By: Rolm Baptise M.D.   On: 03/25/2015 15:06   Dg Chest Port 1 View  04/03/2015   CLINICAL DATA:  Dyspnea  EXAM: PORTABLE CHEST - 1 VIEW  COMPARISON:  04/01/2015  FINDINGS: The heart size and mediastinal contours are within normal limits. Both lungs are clear. The visualized skeletal structures are unremarkable.  IMPRESSION: No active disease.   Electronically Signed   By: Inez Catalina M.D.   On: 04/03/2015 10:39   Dg  Chest Port 1 View  04/01/2015   CLINICAL DATA:  Cough.  History of hypertension.  Initial encounter.  EXAM: PORTABLE CHEST - 1 VIEW  COMPARISON:  03/25/2015 radiographs.  FINDINGS: 1129 hr. Interval extubation with removal of the nasogastric tube and central line. There is improved aeration of the lung bases which are now clear. The heart size and mediastinal contours are stable. There is no pleural effusion or pneumothorax.  IMPRESSION: No active cardiopulmonary process following extubation.   Electronically Signed   By: Richardean Sale M.D.   On: 04/01/2015 12:22   Dg Chest Port 1 View  03/25/2015   CLINICAL DATA:  Central line placement.  Hypothermia.  EXAM: PORTABLE CHEST - 1 VIEW  COMPARISON:  03/25/2015.  FINDINGS: Support apparatus: Endotracheal tube tip 1 cm from the carina. Enteric tube is present doubled back upon itself with the tip at the cardia of the stomach.  New LEFT subclavian central line is present. The tip is in the lower superior vena cava at the cavoatrial junction.  Cardiomediastinal Silhouette:  Unchanged.  Lungs: Opacity at the LEFT costophrenic angle, likely  representing atelectasis. Airspace disease could also produce this appearance. No pneumothorax.  Effusions:  None.  Other:  None.  IMPRESSION: 1. Support apparatus as described above. The endotracheal tube has been retracted but remains 1 cm from the carina. 2. Uncomplicated interval placement of LEFT subclavian central line with the tip at the junction of the superior vena cava and RIGHT atrium. 3. Opacification at the LEFT costophrenic angle which may represent atelectasis or airspace disease.   Electronically Signed   By: Dereck Ligas M.D.   On: 03/25/2015 21:01   Dg Chest Port 1 View  03/25/2015   CLINICAL DATA:  Intubation, cardiac arrest.  EXAM: PORTABLE CHEST - 1 VIEW  COMPARISON:  01/23/2015  FINDINGS: Endotracheal tube has tip in the origin of the left mainstem bronchus. This could be pulled back approximately 3 cm. Nasogastric tube is coiled once over the stomach with tip and side-port over the stomach in the left upper quadrant.  Lungs are hypoinflated without focal consolidation or effusion. Cardiomediastinal silhouette is within normal. Remainder of the exam is unchanged.  IMPRESSION: Hypoinflation without acute cardiopulmonary disease.  Tubes and lines as described. Note that the endotracheal tube has tip over the origin of the left mainstem bronchus.  These results were called by telephone at the time of interpretation on 03/25/2015 at 4:03 pm to Dr. Thomasene Lot, who verbally acknowledged these results.   Electronically Signed   By: Marin Olp M.D.   On: 03/25/2015 16:03   Mr Card Morphology Wo/w Cm  03/30/2015   CLINICAL DATA:  49 year old female post cardiac arrest. Normal cardiac catheterization. Evaluate for myocarditis and ARVC.  EXAM: CARDIAC MRI  TECHNIQUE: The patient was scanned on a 1.5 Tesla GE magnet. A dedicated cardiac coil was used. Functional imaging was done using Fiesta sequences. 2,3, and 4 chamber views were done to assess for RWMA's. Modified Simpson's rule using a short  axis stack was used to calculate an ejection fraction on a dedicated work Conservation officer, nature. The patient received 32 cc of Multihance. After 10 minutes inversion recovery sequences were used to assess for infiltration and scar tissue.  CONTRAST:  32 cc  of Multihance  FINDINGS: 1. Normal left ventricular size, thickness and systolic function (LVEF = 65%). There is of the apical inferior wall.  LVEDD:  49 mm  LVESD:  31 mm  LVEDV:  111 ml  LVESV:  38 ml  SV:  72 ml  LV CO:  5.9 L/minute  Myocardial mass:  101 g  2. Normal right ventricular size, thickness and systolic function (LVEF = 63%). There are no regional wall motion abnormalities.  RVEDV:  114 ml  RVESV:  42 ml  SV:  72 ml  RV CO:  5.9 L/minute  3.  Mild left atrial dilatation.  4.  Mild mitral and mild tricuspid regurgitation.  5. Normal size of the aorta and main pulmonary artery.  6. There is late subendocardial late gadolinium enhancement in the apical inferior wall.  IMPRESSION: 1. Normal left ventricular size, thickness and systolic function (LVEF = 65%) with hypokinesis of the apical inferior wall.  2. Normal right ventricular size, thickness and systolic function (LVEF = 63%) with no regional wall motion abnormalities.  3.  Mild left atrial dilatation.  4.  Mild mitral and mild tricuspid regurgitation.  5. A subendocardial late gadolinium enhancement in the apical inferior wall is consistent with prior subendocardial infarct.  6.  There is no evidence for ARVC.  Ena Dawley   Electronically Signed   By: Ena Dawley   On: 03/30/2015 18:34     CBC  Recent Labs Lab 03/31/15 0445 04/01/15 0339 04/02/15 0415 04/03/15 0254 04/04/15 0402  WBC 7.5 6.9 7.5 8.6 8.0  HGB 10.0* 10.5* 11.5* 11.7* 11.5*  HCT 31.4* 32.6* 35.4* 36.5 35.8*  PLT 273 297 300 344 376  MCV 93.2 93.1 93.4 94.1 95.2  MCH 29.7 30.0 30.3 30.2 30.6  MCHC 31.8 32.2 32.5 32.1 32.1  RDW 14.6 14.4 14.6 14.5 14.6    Chemistries   Recent Labs Lab  03/31/15 0445 04/01/15 0339 04/02/15 0415 04/03/15 0254 04/04/15 0402  NA 139 141 138 136 140  K 3.6 3.7 3.8 3.9 3.9  CL 107 106 101 98* 103  CO2 26 28 28 30 31   GLUCOSE 100* 121* 106* 115* 102*  BUN 7 8 7 12 15   CREATININE 0.58 0.52 0.50 0.62 0.68  CALCIUM 8.1* 8.7* 8.7* 8.6* 8.7*  MG 1.8 1.9 2.1 1.9 1.9  AST  --  37  --  66*  --   ALT  --  34  --  63*  --   ALKPHOS  --  63  --  92  --   BILITOT  --  0.2*  --  0.5  --    ------------------------------------------------------------------------------------------------------------------ estimated creatinine clearance is 95.8 mL/min (by C-G formula based on Cr of 0.68). ------------------------------------------------------------------------------------------------------------------ No results for input(s): HGBA1C in the last 72 hours. ------------------------------------------------------------------------------------------------------------------ No results for input(s): CHOL, HDL, LDLCALC, TRIG, CHOLHDL, LDLDIRECT in the last 72 hours. ------------------------------------------------------------------------------------------------------------------ No results for input(s): TSH, T4TOTAL, T3FREE, THYROIDAB in the last 72 hours.  Invalid input(s): FREET3 ------------------------------------------------------------------------------------------------------------------ No results for input(s): VITAMINB12, FOLATE, FERRITIN, TIBC, IRON, RETICCTPCT in the last 72 hours.  Coagulation profile  Recent Labs Lab 04/04/15 0402  INR 1.01     Recent Labs  04/02/15 1213  DDIMER 2.77*    Cardiac Enzymes No results for input(s): CKMB, TROPONINI, MYOGLOBIN in the last 168 hours.  Invalid input(s): CK ------------------------------------------------------------------------------------------------------------------ Invalid input(s): POCBNP   CBG:  Recent Labs Lab 04/03/15 0735 04/03/15 1143 04/03/15 1639 04/03/15 2151  04/04/15 0741  GLUCAP 85 92 84 105* 94       Studies: Ct Angio Chest Pe W/cm &/or Wo Cm  04/02/2015   CLINICAL DATA:  Dyspnea.  History of deep venous thrombosis.  EXAM:  CT ANGIOGRAPHY CHEST WITH CONTRAST  TECHNIQUE: Multidetector CT imaging of the chest was performed using the standard protocol during bolus administration of intravenous contrast. Multiplanar CT image reconstructions and MIPs were obtained to evaluate the vascular anatomy.  CONTRAST:  5mL OMNIPAQUE IOHEXOL 350 MG/ML SOLN  COMPARISON:  Chest x-ray dated 04/01/2015  FINDINGS: There are no pulmonary emboli. Heart size is normal. No hilar or mediastinal adenopathy. Small amount of calcification in the left anterior descending coronary artery.  Thoracic scoliosis. No acute osseous abnormality. No effusions. 8 mm ground-glass ill-defined abnormality in the right upper lobe. Lungs are otherwise clear. The visualized portion of the upper abdomen is normal.  Review of the MIP images confirms the above findings.  IMPRESSION: 1. No pulmonary emboli. 2. There is an 8 mm ground-glass ill-defined area of abnormal density at the right lung apex best seen on image 25 of series 407. This is indeterminate in etiology.  If the patient is at high risk for bronchogenic carcinoma, follow-up chest CT at 3-15months is recommended. If the patient is at low risk for bronchogenic carcinoma, follow-up chest CT at 6-12 months is recommended. This recommendation follows the consensus statement: Guidelines for Management of Small Pulmonary Nodules Detected on CT Scans: A Statement from the [place] as published in Radiology 2005; 237:395-400.   Electronically Signed   By: Lorriane Shire M.D.   On: 04/02/2015 14:36   Dg Chest Port 1 View  04/03/2015   CLINICAL DATA:  Dyspnea  EXAM: PORTABLE CHEST - 1 VIEW  COMPARISON:  04/01/2015  FINDINGS: The heart size and mediastinal contours are within normal limits. Both lungs are clear. The visualized skeletal  structures are unremarkable.  IMPRESSION: No active disease.   Electronically Signed   By: Inez Catalina M.D.   On: 04/03/2015 10:39      No results found for: HGBA1C Lab Results  Component Value Date   CREATININE 0.68 04/04/2015       Scheduled Meds: . albuterol  2.5 mg Nebulization BID  . aspirin  81 mg Oral Daily  . atorvastatin  80 mg Oral q1800  . carbamide peroxide  5 drop Both Ears BID  . chlorhexidine  60 mL Topical Once  . chlorhexidine  60 mL Topical Once  . ciprofloxacin-dexamethasone  4 drop Both Ears BID  . enoxaparin (LOVENOX) injection  1.5 mg/kg Subcutaneous Q24H  . fluticasone  1 spray Each Nare Daily  . heparin  5,000 Units Intravenous Once  . levETIRAcetam  500 mg Oral BID  . magnesium oxide  400 mg Oral Daily  . metoprolol tartrate  12.5 mg Oral BID  . warfarin  7.5 mg Oral ONCE-1800  . warfarin   Does not apply Once  . Warfarin - Pharmacist Dosing Inpatient   Does not apply q1800   Continuous Infusions: . sodium chloride 50 mL/hr at 04/02/15 0635  . sodium chloride 50 mL/hr at 04/02/15 6967    Principal Problem:   Cardiac arrest Active Problems:   Status epilepticus   Seizure   Essential hypertension   Hyperlipemia   Cough   Dyspnea   DVT (deep venous thrombosis)    Time spent: 45 minutes   [facility] Pager [phone]. If 7PM-7AM, please contact night-coverage at [email or web address], password [facility] 04/04/2015, 11:44 AM  LOS: 10 days

## 2015-04-04 NOTE — Clinical Documentation Improvement (Signed)
MD's, NP's, and PA's  Noted documentation of "Pulmonary Edema"  please provide acuity of condition if appropriate for this admission.  Thank you   Possible Clinical Condition?  Acute Pulmonary Edema  Acute on Chronic Pulmonary Edema  Other Condition   Cannot Clinically Determine    Risk Factors: Cardiac arrest,  Shock, Ventricular Tachycardia  Diagnostics: CT  Angio of Chest   Treatment: Lasix  Thank You, Ree Kida ,RN Clinical Documentation Specialist:  [place] [employer]

## 2015-04-04 NOTE — Clinical Documentation Improvement (Signed)
MD's, NP's, and PA's  Documentation of "Acute Pulmonary Embolism"   but also CT results showing no "Pulmonary Embolism".  Did this patient have an Acute Pulmonary Embolism? Thanks   Possible Conditions  Yes, this patient did have Acute Pulmonary Embolism  No , this patient didn't have Acute Pulmonary Embolism  Other Not able to determine     Risk Factors: Cardiac Arrest, Seizure, DVT   Diagnostics: CT Angio of Chest :    IMPRESSION: 1. No pulmonary emboli. 2. There is an 8 mm ground-glass ill-defined area of abnormal   Treatment: IV Heparin  Thank you,  Ree Kida ,RN Clinical Documentation Specialist:  [place] [employer] Management

## 2015-04-04 NOTE — Progress Notes (Addendum)
ANTICOAGULATION CONSULT NOTE - Follow Up Consult  Pharmacy Consult for Heparin >> Lovenox and Coumadin Indication: DVT  Allergies  Allergen Reactions  . Toradol [Ketorolac Tromethamine] Itching and Rash  . Tramadol Itching and Rash  . Zofran [Ondansetron Hcl] Itching and Rash    Patient Measurements: Height: 5\' 3"  (160 cm) Weight: 211 lb 6.4 oz (95.89 kg) IBW/kg (Calculated) : 52.4  Vital Signs: Temp: 98.2 F (36.8 C) (08/10 0500) Temp Source: Oral (08/10 0500) BP: 106/78 mmHg (08/10 0500)  Labs:  Recent Labs  04/02/15 0415 04/03/15 0254 04/03/15 1015 04/04/15 0402  HGB 11.5* 11.7*  --  11.5*  HCT 35.4* 36.5  --  35.8*  PLT 300 344  --  376  LABPROT  --   --   --  13.5  INR  --   --   --  1.01  HEPARINUNFRC  --  0.48 0.54 0.45  CREATININE 0.50 0.62  --  0.68    Estimated Creatinine Clearance: 95.8 mL/min (by C-G formula based on Cr of 0.68).   Medications:  Scheduled:  . albuterol  2.5 mg Nebulization BID  . aspirin  81 mg Oral Daily  . atorvastatin  80 mg Oral q1800  . chlorhexidine  60 mL Topical Once  . chlorhexidine  60 mL Topical Once  . fluticasone  1 spray Each Nare Daily  . heparin  5,000 Units Intravenous Once  . levETIRAcetam  500 mg Oral BID  . magnesium oxide  400 mg Oral Daily  . metoprolol tartrate  12.5 mg Oral BID  . warfarin   Does not apply Once  . Warfarin - Pharmacist Dosing Inpatient   Does not apply q1800   Infusions:  . sodium chloride 50 mL/hr at 04/02/15 0635  . sodium chloride 50 mL/hr at 04/02/15 0636  . heparin 1,200 Units/hr (04/04/15 0946)    Assessment: 49 yo F with new RLE DVT.  Pt was initially on heparin pending ICD placement.  Decision has been made to start Coumadin with plans for ICD placement when INR >2 and off parenteral anticoagulant.  Today is day #2 of 5d minimum overlap for Coumadin initiation for VTE.  Spoke with Dr. Allyson Sabal today and will change heparin to lovenox to reduce lab draws and allow for increased  ambulation while awaiting therapeutic INR.  Goal of Therapy:  INR 2-3 Anti-Xa level 0.6-1 units/ml 4hrs after LMWH dose given Monitor platelets by anticoagulation protocol: Yes   Plan:  Discontinue heparin infusion and associated labs. One hour after heparin turned off, give Lovenox 1.5mg /kg/day (=145mg ). Repeat Coumadin 7.5mg  PO x 1 dose tonight. Continue daily INR.  [employer], Pharm.D., BCPS Clinical Pharmacist Pager [phone] 04/04/2015 10:33 AM

## 2015-04-04 NOTE — Progress Notes (Signed)
SUBJECTIVE: The patient is doing well today.  \ Walked yseterday Less sob \    Wa;CURRENT MEDICATIONS: . albuterol  2.5 mg Nebulization BID  . aspirin  81 mg Oral Daily  . atorvastatin  80 mg Oral q1800  . chlorhexidine  60 mL Topical Once  . chlorhexidine  60 mL Topical Once  . fluticasone  1 spray Each Nare Daily  . heparin  5,000 Units Intravenous Once  . levETIRAcetam  500 mg Oral BID  . magnesium oxide  400 mg Oral Daily  . metoprolol tartrate  12.5 mg Oral BID  . warfarin   Does not apply Once  . Warfarin - Pharmacist Dosing Inpatient   Does not apply q1800   . sodium chloride 50 mL/hr at 04/02/15 0635  . sodium chloride 50 mL/hr at 04/02/15 0636  . heparin 1,200 Units/hr (04/03/15 0948)    OBJECTIVE: Physical Exam: Filed Vitals:   04/03/15 1311 04/03/15 1500 04/03/15 2028 04/04/15 0500  BP:  120/76 119/71 106/78  Pulse: 117 90 88   Temp:  98.1 F (36.7 C) 98.3 F (36.8 C) 98.2 F (36.8 C)  TempSrc:  Oral Oral Oral  Resp:  16 16 16   Height:      Weight:    211 lb 6.4 oz (95.89 kg)  SpO2: 98% 98% 100% 97%    Intake/Output Summary (Last 24 hours) at 04/04/15 5993 Last data filed at 04/04/15 0500  Gross per 24 hour  Intake      0 ml  Output    300 ml  Net   -300 ml    Telemetry reveals sinus rhythm  Well developed and nourished in no acute distress HENT normal Neck supple with JVP-flat clearer Regular rate and rhythm, no murmurs or gallops Abd-soft with active BS No Clubbing cyanosis edema  Fat feet Skin-warm and dry A & Oriented  Grossly normal sensory and motor function   LABS: Basic Metabolic Panel:  Recent Labs  04/02/15 0415 04/03/15 0254 04/04/15 0402  NA 138 136 140  K 3.8 3.9 3.9  CL 101 98* 103  CO2 28 30 31   GLUCOSE 106* 115* 102*  BUN 7 12 15   CREATININE 0.50 0.62 0.68  CALCIUM 8.7* 8.6* 8.7*  MG 2.1 1.9 1.9  PHOS 4.6  --  4.1   Liver Function Tests:  Recent Labs  04/03/15 0254 04/04/15 0402  AST 66*  --     ALT 63*  --   ALKPHOS 92  --   BILITOT 0.5  --   PROT 5.9*  --   ALBUMIN 2.8* 2.8*   CBC:  Recent Labs  04/03/15 0254 04/04/15 0402  WBC 8.6 8.0  HGB 11.7* 11.5*  HCT 36.5 35.8*  MCV 94.1 95.2  PLT 344 376   D-Dimer:  Recent Labs  04/02/15 1213  DDIMER 2.77*    RADIOLOGY: Ct Angio Chest Pe W/cm &/or Wo Cm 04/02/2015   CLINICAL DATA:  Dyspnea.  History of deep venous thrombosis.  EXAM: CT ANGIOGRAPHY CHEST WITH CONTRAST  TECHNIQUE: Multidetector CT imaging of the chest was performed using the standard protocol during bolus administration of intravenous contrast. Multiplanar CT image reconstructions and MIPs were obtained to evaluate the vascular anatomy.  CONTRAST:  67mL OMNIPAQUE IOHEXOL 350 MG/ML SOLN  COMPARISON:  Chest x-ray dated 04/01/2015  FINDINGS: There are no pulmonary emboli. Heart size is normal. No hilar or mediastinal adenopathy. Small amount of calcification in the left anterior descending coronary artery.  Thoracic  scoliosis. No acute osseous abnormality. No effusions. 8 mm ground-glass ill-defined abnormality in the right upper lobe. Lungs are otherwise clear. The visualized portion of the upper abdomen is normal.  Review of the MIP images confirms the above findings.  IMPRESSION: 1. No pulmonary emboli. 2. There is an 8 mm ground-glass ill-defined area of abnormal density at the right lung apex best seen on image 25 of series 407. This is indeterminate in etiology.  If the patient is at high risk for bronchogenic carcinoma, follow-up chest CT at 3-63months is recommended. If the patient is at low risk for bronchogenic carcinoma, follow-up chest CT at 6-12 months is recommended. This recommendation follows the consensus statement: Guidelines for Management of Small Pulmonary Nodules Detected on CT Scans: A Statement from the [place] as published in Radiology 2005; 237:395-400.   Electronically Signed   By: Lorriane Shire M.D.   On: 04/02/2015 14:36   Dg Chest  Port 1 View 04/01/2015   CLINICAL DATA:  Cough.  History of hypertension.  Initial encounter.  EXAM: PORTABLE CHEST - 1 VIEW  COMPARISON:  03/25/2015 radiographs.  FINDINGS: 1129 hr. Interval extubation with removal of the nasogastric tube and central line. There is improved aeration of the lung bases which are now clear. The heart size and mediastinal contours are stable. There is no pleural effusion or pneumothorax.  IMPRESSION: No active cardiopulmonary process following extubation.   Electronically Signed   By: Richardean Sale M.D.   On: 04/01/2015 12:22   ASSESSMENT AND PLAN:  Principal Problem:   Cardiac arrest Active Problems:   Status epilepticus   Seizure   Essential hypertension   Hyperlipemia   Cough   Dyspnea   Acute pulmonary embolism   DVT (deep venous thrombosis)  1.  Resuscitated VF arrest Cardiac cath with no obstructive CAD and normal LV function.  Cardiac MRI with prior subendocardial infarct. Plan for ICD implant 04/04/15 No driving x6 months Keep K >3.9, Mg >1.8  2.  HTN Stable No change required today  3.  Shortness of breath   4. DVT    Will continue anticoagulation uintil INR is therapeutic and then place ICD It will be a few days revieweed this with her Ambulate as much as possible Stop asa

## 2015-04-04 NOTE — Progress Notes (Signed)
CARDIAC REHAB PHASE I   PRE:  Rate/Rhythm: 81 SR  BP:  Supine: 120/76  Sitting:   Standing:    SaO2: 98%RA  MODE:  Ambulation: 300 ft   POST:  Rate/Rhythm: 93 SR  BP:  Supine:   Sitting: 110/78  Standing:    SaO2: 97%RA 1311-1347 Pt walked 300 ft with gait belt use,rolling walker and asst x 1 with slow steady gait. Tolerated well. C/o right hand swelling and discomfort. To recliner after walk. Call bell in reach. Took several standing rest breaks.    Graylon Good, RN BSN  04/04/2015 1:43 PM

## 2015-04-05 ENCOUNTER — Ambulatory Visit: Payer: Medicaid Other | Admitting: Pain Medicine

## 2015-04-05 LAB — GLUCOSE, CAPILLARY
GLUCOSE-CAPILLARY: 94 mg/dL (ref 65–99)
GLUCOSE-CAPILLARY: 116 mg/dL — AB (ref 65–99)
Glucose-Capillary: 90 mg/dL (ref 65–99)
Glucose-Capillary: 96 mg/dL (ref 65–99)

## 2015-04-05 LAB — RENAL FUNCTION PANEL
Albumin: 2.8 g/dL — ABNORMAL LOW (ref 3.5–5.0)
Anion gap: 8 (ref 5–15)
BUN: 11 mg/dL (ref 6–20)
CALCIUM: 8.7 mg/dL — AB (ref 8.9–10.3)
CO2: 28 mmol/L (ref 22–32)
Chloride: 103 mmol/L (ref 101–111)
Creatinine, Ser: 0.59 mg/dL (ref 0.44–1.00)
Glucose, Bld: 101 mg/dL — ABNORMAL HIGH (ref 65–99)
PHOSPHORUS: 4.6 mg/dL (ref 2.5–4.6)
Potassium: 4.1 mmol/L (ref 3.5–5.1)
SODIUM: 139 mmol/L (ref 135–145)

## 2015-04-05 LAB — MAGNESIUM: Magnesium: 2.1 mg/dL (ref 1.7–2.4)

## 2015-04-05 LAB — PROTIME-INR
INR: 1.01 (ref 0.00–1.49)
PROTHROMBIN TIME: 13.5 s (ref 11.6–15.2)

## 2015-04-05 MED ORDER — WARFARIN SODIUM 10 MG PO TABS
10.0000 mg | ORAL_TABLET | Freq: Once | ORAL | Status: AC
Start: 2015-04-05 — End: 2015-04-05
  Administered 2015-04-05: 10 mg via ORAL
  Filled 2015-04-05: qty 1

## 2015-04-05 NOTE — Progress Notes (Signed)
CARDIAC REHAB PHASE I   PRE:  Rate/Rhythm: 122 sT  BP:  Sitting: 131/85        SaO2: 95 RA  MODE:  Ambulation: 650 ft   POST:  Rate/Rhythm: 94 SR  BP:  Sitting: 124/77         SaO2: 96 RA  Pt ambulated 650 ft on RA, rolling walker, gait belt, assist x1, very slow,steady gait, tolerated well. Pt c/o R hand hurting, wore with use of walker. Pt states she feels like she loses her balance at times, no obvious gait instabililty noted. Pt encouraged to ambulate with staff as tolerated. Pt to recliner after walk, feet elevated, call bell within reach. Will follow.  [phone] Lenna Sciara, RN, BSN 04/05/2015 12:10 PM

## 2015-04-05 NOTE — Progress Notes (Signed)
[facility] PROGRESS NOTE  Claire Mcdonald IHK:[id number] DOB: 07/05/1966 DOA: 03/25/2015 PCP: Golden Pop, MD  Assessment/Plan: Principal Problem:   Cardiac arrest Active Problems:   Status epilepticus   Seizure   Essential hypertension   Hyperlipemia   Cough   Dyspnea   DVT (deep venous thrombosis)    Resuscitated VF arrest - patient has had a resuscitated arrest. Cardiac catheterization with non obstructive CAD and normal LV function. There is no family history of sudden death or syncope. - Cardiac MRI with prior subendocardial infarct.  Flecanide challenge by  Dr Caryl Comes - completed Keep K>3.9, Mg >1.8. No driving x6 months. Patient will ultimately need an ICD, for secondary prevention . This is anticipated to be done when INR is therapeutic per Dr Olin Pia note.  Patient currently on Lovenox and coumadin per cards recommendations    Cough, - productive of green sputum, no difficulty swallowing, Chest x-ray reviewed-no infiltrates - CT angiogram  negative PE, does have proximal right lower extremity DVT . - She is clear from pulmonary standpoint to proceed with ICD placement.  Pulmonary nodule on CT chance  -Will need follow-up CT in 6 months in this never smoker, low risk for malignancy  DVT Patient switched to Lovenox, heparin drip discontinued, initiated on coumadin   Endotracheal intubation for airway protection status post   cardiac arrest Now extubated, on room air  Dyslipidemia - continue statin  Hypertension - continue metoprolol  Hypokalemia  - keep potassium greater than 4.0  Status epilepticus-seizure disorder,  - seen by neurology, continue Keppra , EEG w/o seizure activity   Code Status:      Code Status Orders        Start     Ordered   03/27/15 1450  Full code   Continuous     03/27/15 1454     Family Communication: family updated about patient's clinical progress Disposition Plan:  ICD placement with INR therapeutic   Brief  narrative: 49 y.o. female with a past medical history of hypertension, hyperlipidemia, anxiety, depression, and obesity. She was at work on the day of admission when she collapsed. AED was applied and advised shock x2. Upon EMS arrival, she was intubated and transferred to [facility]. She was transferred to [facility] for further evaluation. She has been extubated and is now awake and alert. She does not remember her arrest. Cardiac catheterization demonstrated mild non obstructive CAD and normal LV function. Echocardiogram demonstrated EF 55-60%, mild LVH, no RWMA. K was 3.7 on admission. She has not had prior syncope. There is no family history of sudden death, drowning, or syncope. She does have a history of tachypalpitations for which she was placed on Metoprolol. She reports compliance with home medications and no recent fevers, chills, nausea, or diarrhea. She has not had chest pain or shortness of breath. She works at [employer] and does not smoke, drink, or use recreational drugs. EP has been asked to evaluate for treatment options.  Consultants:  Cardiology  Electrophysiology  Critical care    Procedures: *STUDIES:  7/31 Head CT - no acute findings 7/31 Neck CT - no acute findings  SIGNIFICANT EVENTS: 7/31 - Appeared to be having seizure like activity on admission. Neurology consulted. Movements stopped on their own.  8/02 - LHC: Reportedly no obstructing CAD  Antibiotics: Anti-infectives    Start     Dose/Rate Route Frequency Ordered Stop   04/02/15 1600  gentamicin (GARAMYCIN) 80 mg in sodium chloride irrigation  0.9 % 500 mL irrigation     80 mg Irrigation To Cath Lab 04/01/15 1951 04/03/15 1600   04/02/15 1600  ceFAZolin (ANCEF) IVPB 2 g/50 mL premix     2 g 100 mL/hr over 30 Minutes Intravenous To Cath Lab 04/01/15 1951 04/03/15 1600   04/02/15 1400  ceFAZolin (ANCEF) 2 g in dextrose 5 % 50 mL IVPB  Status:  Discontinued     2 g 100 mL/hr over 30 Minutes Intravenous To  Cath Lab 04/01/15 2027 04/01/15 2057         HPI/Subjective: Still has a slight cough,congested nasally   Objective: Filed Vitals:   04/05/15 0518 04/05/15 0824 04/05/15 0939 04/05/15 1645  BP: 119/72 110/72  119/85  Pulse: 83 82  82  Temp: 98.1 F (36.7 C) 98.2 F (36.8 C)  98 F (36.7 C)  TempSrc: Oral Oral  Oral  Resp: 16 18  18   Height:      Weight:  95.482 kg (210 lb 8 oz)    SpO2: 99% 96% 94% 99%   No intake or output data in the 24 hours ending 04/05/15 1807  Exam:  General: Awake. Alert. No distress. Sitting up in bed watching TV and eating breakfast. Integument: Warm & dry. No rash on exposed skin. Bruising right wrist unchanged. HEENT: Moist mucus membranes. No scleral icterus. PERRL. Cardiovascular: Regular rhythm. No edema. No appreciable JVD. Pulmonary: Mild crackles bilateral lung bases. Normal work of breathing on nasal cannula oxygen. Speaking in complete sentences. Abdomen: Soft. Normal bowel sounds. Nondistended.  Neurological: Follows commands. She is aware of the year, president, and season. She is also aware that she is in hospital. She does not remember the events of her code. Musculoskeletal: Chest wall pain to palpation.  Data Review   Micro Results No results found for this or any previous visit (from the past 240 hour(s)).  Radiology Reports Dg Abd 1 View  03/25/2015   CLINICAL DATA:  OG tube placement, cardiac arrest  EXAM: ABDOMEN - 1 VIEW  COMPARISON:  CT abdomen pelvis dated 12/30/2014  FINDINGS: Enteric tube is looped in the stomach and terminates in the gastric cardia.  Nonobstructive bowel gas pattern.  Defibrillator pads overlie the lower chest/upper abdomen.  IMPRESSION: Enteric tube is looped in the stomach and terminates in the gastric cardia.   Electronically Signed   By: Julian Hy M.D.   On: 03/25/2015 16:10   Ct Head Wo Contrast  03/25/2015   CLINICAL DATA:  Found on floor at work.  Unresponsive.  EXAM: CT HEAD  WITHOUT CONTRAST  CT CERVICAL SPINE WITHOUT CONTRAST  TECHNIQUE: Multidetector CT imaging of the head and cervical spine was performed following the standard protocol without intravenous contrast. Multiplanar CT image reconstructions of the cervical spine were also generated.  COMPARISON:  10/27/2012  FINDINGS: CT HEAD FINDINGS  No acute intracranial abnormality. Specifically, no hemorrhage, hydrocephalus, mass lesion, acute infarction, or significant intracranial injury. No acute calvarial abnormality. Visualized paranasal sinuses and mastoids clear. Orbital soft tissues unremarkable.  CT CERVICAL SPINE FINDINGS  Motion artifact degrades image quality. No fracture visualized. No malalignment. Prevertebral soft tissues are normal. No epidural or paraspinal hematoma.  IMPRESSION: No intracranial abnormality.  No acute bony abnormality in the cervical spine.   Electronically Signed   By: Rolm Baptise M.D.   On: 03/25/2015 15:06   Ct Angio Chest Pe W/cm &/or Wo Cm  04/02/2015   CLINICAL DATA:  Dyspnea.  History of deep venous thrombosis.  EXAM: CT ANGIOGRAPHY CHEST WITH CONTRAST  TECHNIQUE: Multidetector CT imaging of the chest was performed using the standard protocol during bolus administration of intravenous contrast. Multiplanar CT image reconstructions and MIPs were obtained to evaluate the vascular anatomy.  CONTRAST:  24mL OMNIPAQUE IOHEXOL 350 MG/ML SOLN  COMPARISON:  Chest x-ray dated 04/01/2015  FINDINGS: There are no pulmonary emboli. Heart size is normal. No hilar or mediastinal adenopathy. Small amount of calcification in the left anterior descending coronary artery.  Thoracic scoliosis. No acute osseous abnormality. No effusions. 8 mm ground-glass ill-defined abnormality in the right upper lobe. Lungs are otherwise clear. The visualized portion of the upper abdomen is normal.  Review of the MIP images confirms the above findings.  IMPRESSION: 1. No pulmonary emboli. 2. There is an 8 mm ground-glass  ill-defined area of abnormal density at the right lung apex best seen on image 25 of series 407. This is indeterminate in etiology.  If the patient is at high risk for bronchogenic carcinoma, follow-up chest CT at 3-67months is recommended. If the patient is at low risk for bronchogenic carcinoma, follow-up chest CT at 6-12 months is recommended. This recommendation follows the consensus statement: Guidelines for Management of Small Pulmonary Nodules Detected on CT Scans: A Statement from the [employer] as published in Radiology 2005; 237:395-400.   Electronically Signed   By: Lorriane Shire M.D.   On: 04/02/2015 14:36   Ct Cervical Spine Wo Contrast  03/25/2015   CLINICAL DATA:  Found on floor at work.  Unresponsive.  EXAM: CT HEAD WITHOUT CONTRAST  CT CERVICAL SPINE WITHOUT CONTRAST  TECHNIQUE: Multidetector CT imaging of the head and cervical spine was performed following the standard protocol without intravenous contrast. Multiplanar CT image reconstructions of the cervical spine were also generated.  COMPARISON:  10/27/2012  FINDINGS: CT HEAD FINDINGS  No acute intracranial abnormality. Specifically, no hemorrhage, hydrocephalus, mass lesion, acute infarction, or significant intracranial injury. No acute calvarial abnormality. Visualized paranasal sinuses and mastoids clear. Orbital soft tissues unremarkable.  CT CERVICAL SPINE FINDINGS  Motion artifact degrades image quality. No fracture visualized. No malalignment. Prevertebral soft tissues are normal. No epidural or paraspinal hematoma.  IMPRESSION: No intracranial abnormality.  No acute bony abnormality in the cervical spine.   Electronically Signed   By: Rolm Baptise M.D.   On: 03/25/2015 15:06   Dg Chest Port 1 View  04/03/2015   CLINICAL DATA:  Dyspnea  EXAM: PORTABLE CHEST - 1 VIEW  COMPARISON:  04/01/2015  FINDINGS: The heart size and mediastinal contours are within normal limits. Both lungs are clear. The visualized skeletal structures are  unremarkable.  IMPRESSION: No active disease.   Electronically Signed   By: Inez Catalina M.D.   On: 04/03/2015 10:39   Dg Chest Port 1 View  04/01/2015   CLINICAL DATA:  Cough.  History of hypertension.  Initial encounter.  EXAM: PORTABLE CHEST - 1 VIEW  COMPARISON:  03/25/2015 radiographs.  FINDINGS: 1129 hr. Interval extubation with removal of the nasogastric tube and central line. There is improved aeration of the lung bases which are now clear. The heart size and mediastinal contours are stable. There is no pleural effusion or pneumothorax.  IMPRESSION: No active cardiopulmonary process following extubation.   Electronically Signed   By: Richardean Sale M.D.   On: 04/01/2015 12:22   Dg Chest Port 1 View  03/25/2015   CLINICAL DATA:  Central line placement.  Hypothermia.  EXAM: PORTABLE CHEST - 1 VIEW  COMPARISON:  03/25/2015.  FINDINGS: Support apparatus: Endotracheal tube tip 1 cm from the carina. Enteric tube is present doubled back upon itself with the tip at the cardia of the stomach.  New LEFT subclavian central line is present. The tip is in the lower superior vena cava at the cavoatrial junction.  Cardiomediastinal Silhouette:  Unchanged.  Lungs: Opacity at the LEFT costophrenic angle, likely representing atelectasis. Airspace disease could also produce this appearance. No pneumothorax.  Effusions:  None.  Other:  None.  IMPRESSION: 1. Support apparatus as described above. The endotracheal tube has been retracted but remains 1 cm from the carina. 2. Uncomplicated interval placement of LEFT subclavian central line with the tip at the junction of the superior vena cava and RIGHT atrium. 3. Opacification at the LEFT costophrenic angle which may represent atelectasis or airspace disease.   Electronically Signed   By: Dereck Ligas M.D.   On: 03/25/2015 21:01   Dg Chest Port 1 View  03/25/2015   CLINICAL DATA:  Intubation, cardiac arrest.  EXAM: PORTABLE CHEST - 1 VIEW  COMPARISON:  01/23/2015   FINDINGS: Endotracheal tube has tip in the origin of the left mainstem bronchus. This could be pulled back approximately 3 cm. Nasogastric tube is coiled once over the stomach with tip and side-port over the stomach in the left upper quadrant.  Lungs are hypoinflated without focal consolidation or effusion. Cardiomediastinal silhouette is within normal. Remainder of the exam is unchanged.  IMPRESSION: Hypoinflation without acute cardiopulmonary disease.  Tubes and lines as described. Note that the endotracheal tube has tip over the origin of the left mainstem bronchus.  These results were called by telephone at the time of interpretation on 03/25/2015 at 4:03 pm to Dr. Thomasene Lot, who verbally acknowledged these results.   Electronically Signed   By: Marin Olp M.D.   On: 03/25/2015 16:03   Mr Card Morphology Wo/w Cm  03/30/2015   CLINICAL DATA:  49 year old female post cardiac arrest. Normal cardiac catheterization. Evaluate for myocarditis and ARVC.  EXAM: CARDIAC MRI  TECHNIQUE: The patient was scanned on a 1.5 Tesla GE magnet. A dedicated cardiac coil was used. Functional imaging was done using Fiesta sequences. 2,3, and 4 chamber views were done to assess for RWMA's. Modified Simpson's rule using a short axis stack was used to calculate an ejection fraction on a dedicated work Conservation officer, nature. The patient received 32 cc of Multihance. After 10 minutes inversion recovery sequences were used to assess for infiltration and scar tissue.  CONTRAST:  32 cc  of Multihance  FINDINGS: 1. Normal left ventricular size, thickness and systolic function (LVEF = 65%). There is of the apical inferior wall.  LVEDD:  49 mm  LVESD:  31 mm  LVEDV:  111 ml  LVESV:  38 ml  SV:  72 ml  LV CO:  5.9 L/minute  Myocardial mass:  101 g  2. Normal right ventricular size, thickness and systolic function (LVEF = 63%). There are no regional wall motion abnormalities.  RVEDV:  114 ml  RVESV:  42 ml  SV:  72 ml  RV CO:  5.9  L/minute  3.  Mild left atrial dilatation.  4.  Mild mitral and mild tricuspid regurgitation.  5. Normal size of the aorta and main pulmonary artery.  6. There is late subendocardial late gadolinium enhancement in the apical inferior wall.  IMPRESSION: 1. Normal left ventricular size, thickness and systolic function (LVEF = 65%) with hypokinesis of the apical inferior  wall.  2. Normal right ventricular size, thickness and systolic function (LVEF = 63%) with no regional wall motion abnormalities.  3.  Mild left atrial dilatation.  4.  Mild mitral and mild tricuspid regurgitation.  5. A subendocardial late gadolinium enhancement in the apical inferior wall is consistent with prior subendocardial infarct.  6.  There is no evidence for ARVC.  Ena Dawley   Electronically Signed   By: Ena Dawley   On: 03/30/2015 18:34     CBC  Recent Labs Lab 03/31/15 0445 04/01/15 0339 04/02/15 0415 04/03/15 0254 04/04/15 0402  WBC 7.5 6.9 7.5 8.6 8.0  HGB 10.0* 10.5* 11.5* 11.7* 11.5*  HCT 31.4* 32.6* 35.4* 36.5 35.8*  PLT 273 297 300 344 376  MCV 93.2 93.1 93.4 94.1 95.2  MCH 29.7 30.0 30.3 30.2 30.6  MCHC 31.8 32.2 32.5 32.1 32.1  RDW 14.6 14.4 14.6 14.5 14.6    Chemistries   Recent Labs Lab 04/01/15 0339 04/02/15 0415 04/03/15 0254 04/04/15 0402 04/05/15 0437  NA 141 138 136 140 139  K 3.7 3.8 3.9 3.9 4.1  CL 106 101 98* 103 103  CO2 28 28 30 31 28   GLUCOSE 121* 106* 115* 102* 101*  BUN 8 7 12 15 11   CREATININE 0.52 0.50 0.62 0.68 0.59  CALCIUM 8.7* 8.7* 8.6* 8.7* 8.7*  MG 1.9 2.1 1.9 1.9 2.1  AST 37  --  66*  --   --   ALT 34  --  63*  --   --   ALKPHOS 63  --  92  --   --   BILITOT 0.2*  --  0.5  --   --    ------------------------------------------------------------------------------------------------------------------ estimated creatinine clearance is 95.5 mL/min (by C-G formula based on Cr of  0.59). ------------------------------------------------------------------------------------------------------------------ No results for input(s): HGBA1C in the last 72 hours. ------------------------------------------------------------------------------------------------------------------ No results for input(s): CHOL, HDL, LDLCALC, TRIG, CHOLHDL, LDLDIRECT in the last 72 hours. ------------------------------------------------------------------------------------------------------------------ No results for input(s): TSH, T4TOTAL, T3FREE, THYROIDAB in the last 72 hours.  Invalid input(s): FREET3 ------------------------------------------------------------------------------------------------------------------ No results for input(s): VITAMINB12, FOLATE, FERRITIN, TIBC, IRON, RETICCTPCT in the last 72 hours.  Coagulation profile  Recent Labs Lab 04/04/15 0402 04/05/15 0437  INR 1.01 1.01    No results for input(s): DDIMER in the last 72 hours.  Cardiac Enzymes No results for input(s): CKMB, TROPONINI, MYOGLOBIN in the last 168 hours.  Invalid input(s): CK ------------------------------------------------------------------------------------------------------------------ Invalid input(s): POCBNP   CBG:  Recent Labs Lab 04/04/15 1627 04/04/15 2103 04/05/15 0819 04/05/15 1125 04/05/15 1645  GLUCAP 88 98 94 116* 90       Studies: No results found.    No results found for: HGBA1C Lab Results  Component Value Date   CREATININE 0.59 04/05/2015       Scheduled Meds: . albuterol  2.5 mg Nebulization BID  . atorvastatin  80 mg Oral q1800  . carbamide peroxide  5 drop Both Ears BID  . chlorhexidine  60 mL Topical Once  . chlorhexidine  60 mL Topical Once  . enoxaparin (LOVENOX) injection  1.5 mg/kg Subcutaneous Q24H  . fluticasone  1 spray Each Nare Daily  . levETIRAcetam  500 mg Oral BID  . magnesium oxide  400 mg Oral Daily  . metoprolol tartrate  12.5 mg Oral  BID  . warfarin  10 mg Oral ONCE-1800  . warfarin   Does not apply Once  . Warfarin - Pharmacist Dosing Inpatient   Does not apply [phone]  Continuous Infusions: . sodium chloride 50 mL/hr at 04/02/15 0635  . sodium chloride 50 mL/hr at 04/02/15 8938    Principal Problem:   Cardiac arrest Active Problems:   Status epilepticus   Seizure   Essential hypertension   Hyperlipemia   Cough   Dyspnea   DVT (deep venous thrombosis)    Time spent: 30 minutes   Shilo Philipson  [facility] Pager [phone]. If 7PM-7AM, please contact night-coverage at [email or web address], password [facility] 04/05/2015, 6:07 PM  LOS: 11 days

## 2015-04-05 NOTE — Progress Notes (Signed)
ANTICOAGULATION CONSULT NOTE - Follow Up Consult  Pharmacy Consult for Lovenox and Coumadin Indication: DVT  Allergies  Allergen Reactions  . Toradol [Ketorolac Tromethamine] Itching and Rash  . Tramadol Itching and Rash  . Zofran [Ondansetron Hcl] Itching and Rash    Patient Measurements: Height: 5\' 3"  (160 cm) Weight: 210 lb 8 oz (95.482 kg) IBW/kg (Calculated) : 52.4  Vital Signs: Temp: 98.2 F (36.8 C) (08/11 0824) Temp Source: Oral (08/11 0824) BP: 110/72 mmHg (08/11 0824) Pulse Rate: 82 (08/11 0824)  Labs:  Recent Labs  04/03/15 0254 04/03/15 1015 04/04/15 0402 04/05/15 0437  HGB 11.7*  --  11.5*  --   HCT 36.5  --  35.8*  --   PLT 344  --  376  --   LABPROT  --   --  13.5 13.5  INR  --   --  1.01 1.01  HEPARINUNFRC 0.48 0.54 0.45  --   CREATININE 0.62  --  0.68 0.59    Estimated Creatinine Clearance: 95.5 mL/min (by C-G formula based on Cr of 0.59).   Medications:  Scheduled:  . albuterol  2.5 mg Nebulization BID  . aspirin  81 mg Oral Daily  . atorvastatin  80 mg Oral q1800  . carbamide peroxide  5 drop Both Ears BID  . chlorhexidine  60 mL Topical Once  . chlorhexidine  60 mL Topical Once  . enoxaparin (LOVENOX) injection  1.5 mg/kg Subcutaneous Q24H  . fluticasone  1 spray Each Nare Daily  . heparin  5,000 Units Intravenous Once  . levETIRAcetam  500 mg Oral BID  . magnesium oxide  400 mg Oral Daily  . metoprolol tartrate  12.5 mg Oral BID  . warfarin   Does not apply Once  . Warfarin - Pharmacist Dosing Inpatient   Does not apply q1800   Infusions:  . sodium chloride 50 mL/hr at 04/02/15 0635  . sodium chloride 50 mL/hr at 04/02/15 0636    Assessment: 49 yo F with new RLE DVT.  Pt was initially on heparin pending ICD placement.  Decision has been made to start Coumadin with plans for ICD placement when INR >2 and off parenteral anticoagulant.  Today is day #3 of 5d minimum overlap for Coumadin initiation for VTE.  Goal of Therapy:  INR  2-3 Anti-Xa level 0.6-1 units/ml 4hrs after LMWH dose given Monitor platelets by anticoagulation protocol: Yes   Plan:  Continue Lovenox 1.5mg /kg/day (=145mg ). Increase Coumadin 10 mg PO x 1 dose tonight. Continue daily INR.  [employer], Pharm.D., BCPS Clinical Pharmacist Pager [phone] 04/05/2015 10:28 AM

## 2015-04-05 NOTE — Progress Notes (Signed)
Nutrition Follow-up  DOCUMENTATION CODES:   Morbid obesity  INTERVENTION:    Continue heart healthy diet to provide 100% of estimated needs  NUTRITION DIAGNOSIS:   Inadequate oral intake related to inability to eat as evidenced by NPO status.  Resolved; no new nutrition diagnosis at this time.  GOAL:   Patient will meet greater than or equal to 90% of their needs  Met  MONITOR:   PO intake, Labs, Weight trends, Skin, I & O's   ASSESSMENT:   Claire Mcdonald is 49 y/o woman with past medical history of hypertension, chronic pain and depression who is on gabapentin and prn topomax. She was working at [employer] this evening when her coworkers called EMS to report seizure like activity. When EMS arrived she was placed on an AED and shock was advised. She was shocked twice and a King airway placed. She was noted to again have seizure like activity in the ambulance and in the ED at [facility]. She was treated with ativan and keppra. She was transferred to [facility] for cooling after out of hospital arrest.  Plans for ICD placement on Monday. PO intake is adequate, appetite good. Patient with no nutrition needs at this time.  Diet Order:  Diet Heart Room service appropriate?: Yes; Fluid consistency:: Thin  Skin:  Reviewed, no issues  Last BM:  8/10  Height:   Ht Readings from Last 1 Encounters:  03/26/15 '5\' 3"'  (1.6 m)    Weight:   Wt Readings from Last 1 Encounters:  04/05/15 210 lb 8 oz (95.482 kg)    Ideal Body Weight:  52.3 kg  BMI:  Body mass index is 37.3 kg/(m^2).  Estimated Nutritional Needs:   Kcal:  6039-0564  Protein:  95-105 grams  Fluid:  1.9-2.1 L  EDUCATION NEEDS:   Education needs addressed  Molli Barrows, [place], [place], [place] Pager [phone] After Hours Pager [phone]

## 2015-04-05 NOTE — Progress Notes (Signed)
SUBJECTIVE: The patient is doing well today.  \ Walked yseterday Less sob \    Wa;CURRENT MEDICATIONS: . albuterol  2.5 mg Nebulization BID  . aspirin  81 mg Oral Daily  . atorvastatin  80 mg Oral q1800  . carbamide peroxide  5 drop Both Ears BID  . chlorhexidine  60 mL Topical Once  . chlorhexidine  60 mL Topical Once  . enoxaparin (LOVENOX) injection  1.5 mg/kg Subcutaneous Q24H  . fluticasone  1 spray Each Nare Daily  . heparin  5,000 Units Intravenous Once  . levETIRAcetam  500 mg Oral BID  . magnesium oxide  400 mg Oral Daily  . metoprolol tartrate  12.5 mg Oral BID  . warfarin  10 mg Oral ONCE-1800  . warfarin   Does not apply Once  . Warfarin - Pharmacist Dosing Inpatient   Does not apply q1800   . sodium chloride 50 mL/hr at 04/02/15 0635  . sodium chloride 50 mL/hr at 04/02/15 0636    OBJECTIVE: Physical Exam: Filed Vitals:   04/04/15 2154 04/05/15 0518 04/05/15 0824 04/05/15 0939  BP:  119/72 110/72   Pulse:  83 82   Temp:  98.1 F (36.7 C) 98.2 F (36.8 C)   TempSrc:  Oral Oral   Resp:  16 18   Height:      Weight:   210 lb 8 oz (95.482 kg)   SpO2: 98% 99% 96% 94%   No intake or output data in the 24 hours ending 04/05/15 1057  Telemetry reveals sinus rhythm  Well developed and nourished in no acute distress HENT normal Neck supple  clearer Regular rate and rhythm, no murmurs or gallops Abd-soft with active BS No Clubbing cyanosis edema  Fat feet Skin-warm and dry A & Oriented  Grossly normal sensory and motor function   LABS: Basic Metabolic Panel:  Recent Labs  04/04/15 0402 04/05/15 0437  NA 140 139  K 3.9 4.1  CL 103 103  CO2 31 28  GLUCOSE 102* 101*  BUN 15 11  CREATININE 0.68 0.59  CALCIUM 8.7* 8.7*  MG 1.9 2.1  PHOS 4.1 4.6   Liver Function Tests:  Recent Labs  04/03/15 0254 04/04/15 0402 04/05/15 0437  AST 66*  --   --   ALT 63*  --   --   ALKPHOS 92  --   --   BILITOT 0.5  --   --   PROT 5.9*  --   --     ALBUMIN 2.8* 2.8* 2.8*   CBC:  Recent Labs  04/03/15 0254 04/04/15 0402  WBC 8.6 8.0  HGB 11.7* 11.5*  HCT 36.5 35.8*  MCV 94.1 95.2  PLT 344 376   D-Dimer:  Recent Labs  04/02/15 1213  DDIMER 2.77*    RADIOLOGY: Ct Angio Chest Pe W/cm &/or Wo Cm 04/02/2015   CLINICAL DATA:  Dyspnea.  History of deep venous thrombosis.  EXAM: CT ANGIOGRAPHY CHEST WITH CONTRAST  TECHNIQUE: Multidetector CT imaging of the chest was performed using the standard protocol during bolus administration of intravenous contrast. Multiplanar CT image reconstructions and MIPs were obtained to evaluate the vascular anatomy.  CONTRAST:  40mL OMNIPAQUE IOHEXOL 350 MG/ML SOLN  COMPARISON:  Chest x-ray dated 04/01/2015  FINDINGS: There are no pulmonary emboli. Heart size is normal. No hilar or mediastinal adenopathy. Small amount of calcification in the left anterior descending coronary artery.  Thoracic scoliosis. No acute osseous abnormality. No effusions. 8 mm ground-glass ill-defined  abnormality in the right upper lobe. Lungs are otherwise clear. The visualized portion of the upper abdomen is normal.  Review of the MIP images confirms the above findings.  IMPRESSION: 1. No pulmonary emboli. 2. There is an 8 mm ground-glass ill-defined area of abnormal density at the right lung apex best seen on image 25 of series 407. This is indeterminate in etiology.  If the patient is at high risk for bronchogenic carcinoma, follow-up chest CT at 3-62months is recommended. If the patient is at low risk for bronchogenic carcinoma, follow-up chest CT at 6-12 months is recommended. This recommendation follows the consensus statement: Guidelines for Management of Small Pulmonary Nodules Detected on CT Scans: A Statement from the [employer] as published in Radiology 2005; 237:395-400.   Electronically Signed   By: Lorriane Shire M.D.   On: 04/02/2015 14:36   Dg Chest Port 1 View 04/01/2015   CLINICAL DATA:  Cough.  History of  hypertension.  Initial encounter.  EXAM: PORTABLE CHEST - 1 VIEW  COMPARISON:  03/25/2015 radiographs.  FINDINGS: 1129 hr. Interval extubation with removal of the nasogastric tube and central line. There is improved aeration of the lung bases which are now clear. The heart size and mediastinal contours are stable. There is no pleural effusion or pneumothorax.  IMPRESSION: No active cardiopulmonary process following extubation.   Electronically Signed   By: Richardean Sale M.D.   On: 04/01/2015 12:22   ASSESSMENT AND PLAN:  Principal Problem:   Cardiac arrest Active Problems:   Status epilepticus   Seizure   Essential hypertension   Hyperlipemia   Cough   Dyspnea   DVT (deep venous thrombosis)  1.  Resuscitated VF arrest Cardiac cath with no obstructive CAD and normal LV function.  Cardiac MRI with prior subendocardial infarct. Plan for ICD implant 04/04/15 No driving x6 months Keep K >3.9, Mg >1.8  2.  HTN Stable No change required today  3.  Shortness of breath   4. DVT    Will continue anticoagulation uintil INR is therapeutic and then place ICD tentativeily scheduled for monday   Ambulate as much as possible Stop asa-done

## 2015-04-06 LAB — RENAL FUNCTION PANEL
ALBUMIN: 2.8 g/dL — AB (ref 3.5–5.0)
ANION GAP: 8 (ref 5–15)
BUN: 8 mg/dL (ref 6–20)
CHLORIDE: 102 mmol/L (ref 101–111)
CO2: 30 mmol/L (ref 22–32)
Calcium: 8.8 mg/dL — ABNORMAL LOW (ref 8.9–10.3)
Creatinine, Ser: 0.62 mg/dL (ref 0.44–1.00)
Glucose, Bld: 114 mg/dL — ABNORMAL HIGH (ref 65–99)
Phosphorus: 4.1 mg/dL (ref 2.5–4.6)
Potassium: 3.6 mmol/L (ref 3.5–5.1)
SODIUM: 140 mmol/L (ref 135–145)

## 2015-04-06 LAB — GLUCOSE, CAPILLARY
GLUCOSE-CAPILLARY: 99 mg/dL (ref 65–99)
Glucose-Capillary: 100 mg/dL — ABNORMAL HIGH (ref 65–99)
Glucose-Capillary: 92 mg/dL (ref 65–99)

## 2015-04-06 LAB — MAGNESIUM: Magnesium: 2.1 mg/dL (ref 1.7–2.4)

## 2015-04-06 LAB — PROTIME-INR
INR: 1.43 (ref 0.00–1.49)
Prothrombin Time: 17.5 seconds — ABNORMAL HIGH (ref 11.6–15.2)

## 2015-04-06 MED ORDER — ZOLPIDEM TARTRATE 5 MG PO TABS
5.0000 mg | ORAL_TABLET | Freq: Every day | ORAL | Status: AC
  Administered 2015-04-06: 5 mg via ORAL

## 2015-04-06 MED ORDER — ZOLPIDEM TARTRATE 5 MG PO TABS
5.0000 mg | ORAL_TABLET | Freq: Every day | ORAL | Status: DC
  Filled 2015-04-06: qty 1

## 2015-04-06 MED ORDER — WARFARIN SODIUM 7.5 MG PO TABS
7.5000 mg | ORAL_TABLET | Freq: Once | ORAL | Status: AC
  Administered 2015-04-06: 7.5 mg via ORAL
  Filled 2015-04-06: qty 1

## 2015-04-06 MED ORDER — ZOLPIDEM TARTRATE 5 MG PO TABS: 5.0000 mg | ORAL_TABLET | Freq: Once | ORAL | Status: DC

## 2015-04-06 MED ORDER — ALBUTEROL SULFATE (2.5 MG/3ML) 0.083% IN NEBU: 2.5000 mg | INHALATION_SOLUTION | RESPIRATORY_TRACT | Status: DC | PRN

## 2015-04-06 NOTE — Progress Notes (Signed)
Physical Therapy Treatment Patient Details Name: Claire Mcdonald MRN: [id number] DOB: January 30, 1966 Today's Date: 04/06/2015    History of Present Illness Claire Mcdonald is 49 y/o woman with past medical history of hypertension, chronic pain and depression who is on gabapentin and prn topomax. She was working on evening of admit when her coworkers called EMS to report seizure like activity.She was shocked twice and a [place] airway placed. She was noted to again have seizure like activity in the ambulance and in the ED at [facility]. She was extubated on 8/4.  (+) LE DVT.  Started on IV heparin.    PT Comments    Pt making good progress with mobility.  Still limited by pain in chest, decreased endurance and reliance on UE support during gait.  Note when PT arrived she was wanting to stand at sink to wash up, however upon beginning task, HR up to 150's therefore had pt sit to perform remainder of task with improvement to 120's.  Pt agreeable to gait in hallway.  Performed x 300' with RW at S level with HR up to 130's at highest but 120's 80% of time.  Would like to perform stairs with pt, but deferred today due to elevated HR.   Follow Up Recommendations  Home health PT;Other (comment)     Equipment Recommendations  Rolling walker with 5" wheels    Recommendations for Other Services       Precautions / Restrictions Precautions Precautions: Other (comment) Precaution Comments: monitor HR Restrictions Weight Bearing Restrictions: No    Mobility  Bed Mobility               General bed mobility comments: Pt standing at sink when PT arrived.   Transfers Overall transfer level: Needs assistance Equipment used: Rolling walker (2 wheeled) Transfers: Sit to/from Stand Sit to Stand: Supervision         General transfer comment: cues for hand placement to push from bed  Ambulation/Gait Ambulation/Gait assistance: Supervision Ambulation Distance (Feet): 300 Feet Assistive device: Rolling  walker (2 wheeled) Gait Pattern/deviations: Step-through pattern;Decreased stride length Gait velocity: decreased   General Gait Details: Pt with good steady gait speed due to increased HR.  No overt LOB, however pt unwilling to not use RW at time of session.  Min cues for negotiation through tight spaces and to avoid obstacles.    Stairs            Wheelchair Mobility    Modified Rankin (Stroke Patients Only)       Balance Overall balance assessment: Needs assistance Sitting-balance support: Feet supported Sitting balance-Leahy Scale: Good     Standing balance support: Single extremity supported;During functional activity Standing balance-Leahy Scale: Fair Standing balance comment: Pt able to stand at sink with intermittent single UE support to perform peri care.                     Cognition Arousal/Alertness: Awake/alert Behavior During Therapy: WFL for tasks assessed/performed Overall Cognitive Status: Within Functional Limits for tasks assessed                      Exercises      General Comments        Pertinent Vitals/Pain Pain Assessment: 0-10 Pain Score: 7  Pain Location: chest Pain Descriptors / Indicators: Aching;Sore Pain Intervention(s): Patient requesting pain meds-RN notified    Home Living  Prior Function            PT Goals (current goals can now be found in the care plan section) Acute Rehab PT Goals Patient Stated Goal: to return home and get stronger PT Goal Formulation: With patient Time For Goal Achievement: 04/13/15 Potential to Achieve Goals: Good Progress towards PT goals: Progressing toward goals    Frequency  Min 3X/week    PT Plan Current plan remains appropriate    Co-evaluation             End of Session   Activity Tolerance: Patient tolerated treatment well Patient left: in chair;with call bell/phone within reach;with family/visitor present     Time:  0601-5615 PT Time Calculation (min) (ACUTE ONLY): 35 min  Charges:  $Gait Training: 23-37 mins                    G Codes:      Claire Mcdonald 04/06/2015, 9:42 AM

## 2015-04-06 NOTE — Progress Notes (Signed)
CARDIAC REHAB PHASE I   PRE:  Rate/Rhythm: 76 SR  BP:  Supine: 104/78  Sitting:   Standing:    SaO2: 94%RA  MODE:  Ambulation: 1100 ft   POST:  Rate/Rhythm: 100  BP:  Supine:   Sitting: 126/80  Standing:    SaO2: 95%RA 1350-1442 Pt walked 1100 ft on RA with rolling walker with slow steady gait. Tolerated well. No complaints except still with some ear discomfort and headache. To bed after walk.    Graylon Good, RN BSN  04/06/2015 2:38 PM

## 2015-04-06 NOTE — Progress Notes (Signed)
ANTICOAGULATION CONSULT NOTE - Follow Up Consult  Pharmacy Consult for Lovenox and Coumadin Indication: DVT  Allergies  Allergen Reactions  . Toradol [Ketorolac Tromethamine] Itching and Rash  . Tramadol Itching and Rash  . Zofran [Ondansetron Hcl] Itching and Rash    Patient Measurements: Height: 5\' 1"  (154.9 cm) Weight: 211 lb (95.709 kg) IBW/kg (Calculated) : 47.8  Vital Signs: Temp: 97.9 F (36.6 C) (08/12 0449) Temp Source: Oral (08/12 0449) BP: 123/77 mmHg (08/12 0449) Pulse Rate: 94 (08/11 2325)  Labs:  Recent Labs  04/04/15 0402 04/05/15 0437 04/06/15 0547  HGB 11.5*  --   --   HCT 35.8*  --   --   PLT 376  --   --   LABPROT 13.5 13.5 17.5*  INR 1.01 1.01 1.43  HEPARINUNFRC 0.45  --   --   CREATININE 0.68 0.59 0.62    Estimated Creatinine Clearance: 92 mL/min (by C-G formula based on Cr of 0.62).   Medications:  Scheduled:  . atorvastatin  80 mg Oral q1800  . carbamide peroxide  5 drop Both Ears BID  . chlorhexidine  60 mL Topical Once  . chlorhexidine  60 mL Topical Once  . enoxaparin (LOVENOX) injection  1.5 mg/kg Subcutaneous Q24H  . fluticasone  1 spray Each Nare Daily  . levETIRAcetam  500 mg Oral BID  . magnesium oxide  400 mg Oral Daily  . metoprolol tartrate  12.5 mg Oral BID  . warfarin   Does not apply Once  . Warfarin - Pharmacist Dosing Inpatient   Does not apply q1800   Infusions:  . sodium chloride 50 mL/hr at 04/02/15 0635  . sodium chloride 50 mL/hr at 04/02/15 0636    Assessment: 49 yo F with new RLE DVT.  Pt was initially on heparin pending ICD placement.  Decision has been made to start Coumadin with plans for ICD placement when INR >2 and off parenteral anticoagulant.  Today is day #4 of 5d minimum overlap for Coumadin initiation for VTE.  INR has trended up nicely, will reduce Coumadin dose to 7.5mg  tonight.  Goal of Therapy:  INR 2-3 Anti-Xa level 0.6-1 units/ml 4hrs after LMWH dose given Monitor platelets by  anticoagulation protocol: Yes   Plan:  Continue Lovenox 1.5mg /kg/day (=145mg ). Increase Coumadin 7.5 mg PO x 1 dose tonight. Continue daily INR.  [employer], Pharm.D., BCPS Clinical Pharmacist Pager [phone] 04/06/2015 10:40 AM

## 2015-04-06 NOTE — Progress Notes (Addendum)
[facility] PROGRESS NOTE  Claire SWARTZENTRUBER BPZ:[id number] DOB: [birth year] DOA: 03/25/2015 PCP: Golden Pop, MD  BRIEF NARRATIVE 49 year old obese female with history of hypertension, hyperlipidemia, anxiety, depression,was at work and suddenly collapsed. Patient received shock 2. She was intubated by EMS and transferred to [facility]. She was then transferred to [facility] for further evaluation. Patient extubated and cardiac catheterization done showed mild nonobstructive CAD with normal LV function. Echo showing EF of 55-60% with mild LVH and no wall motion abnormality. No prior history of syncope or seizures. Appears patient may have had status epilepticus. No family history of sudden cardiac death. Patient is a nonsmoker, does not drink or use illicit drug.   Assessment/Plan: Resuscitated VF arrest Possibly in the setting of acute seizures. Cardiac MRI with prior subendocardial infarct. Flexion I challenged by Dr. Caryl Comes and completed.  keep potassium >4 and magnesium >1.8. Cannot drive for 6 months. Plan her ICD placement for secondary prevention on 8/15.  Possible status epilepticus contributing to VF arrest Seen by neurology and started on Keppra. EEG unremarkable.no prior history of seizures.  DVT of right lower leg Patient on Lovenox and Coumadin. Plan on AICD once INR therapeutic. CT angiogram of the chest negative for PE.  Dyslipidemia Continue statin  Hypertension Stable. Continue metoprolol  8 mm nonspecified groundglass density on right upper lung Seen on CT chest. Recommend follow-up chest CT in 6-12 monthsas patient is low risk.  DVT prophylaxis: Lovenox with Coumadin  Code Status:full code Family Communication: None at bedside Disposition Plan:home possibly next week   Consultants:  Neurology  Cardiology  AP  Critical care  Procedures:  head CT  EEG  Cardiac cath  2-D echo  CT angiogram of the chest  MRI with cardio  morphology  Antibiotics:  none  HPI/Subjective: Patient seen and examined. Reports chest soreness from shock received  Objective: Filed Vitals:   04/06/15 0449  BP: 123/77  Pulse:   Temp: 97.9 F (36.6 C)  Resp: 18    Intake/Output Summary (Last 24 hours) at 04/06/15 1650 Last data filed at 04/06/15 0400  Gross per 24 hour  Intake   1080 ml  Output   3500 ml  Net  -2420 ml   Filed Weights   04/04/15 0500 04/05/15 0824 04/06/15 0449  Weight: 95.89 kg (211 lb 6.4 oz) 95.482 kg (210 lb 8 oz) 95.709 kg (211 lb)    Exam:   General:  Middle aged obese female in no distress    HEENT: No pallor, moist oral mucosa    chest: Clear to auscultation bilaterally   CVS: Normal S1 and S2, no murmurs rub or gallop  GI: Soft, nondistended, nontender, bowel sounds present  Musculoskeletal musculoskeletal: Warm, no edema  CNS: Alert and oriented  Data Reviewed: Basic Metabolic Panel:  Recent Labs Lab 04/01/15 0339 04/02/15 0415 04/03/15 0254 04/04/15 0402 04/05/15 0437 04/06/15 0547  NA 141 138 136 140 139 140  K 3.7 3.8 3.9 3.9 4.1 3.6  CL 106 101 98* 103 103 102  CO2 28 28 30 31 28 30   GLUCOSE 121* 106* 115* 102* 101* 114*  BUN 8 7 12 15 11 8   CREATININE 0.52 0.50 0.62 0.68 0.59 0.62  CALCIUM 8.7* 8.7* 8.6* 8.7* 8.7* 8.8*  MG 1.9 2.1 1.9 1.9 2.1 2.1  PHOS 4.3 4.6  --  4.1 4.6 4.1   Liver Function Tests:  Recent Labs Lab 04/01/15 0339 04/02/15 0415 04/03/15 0254 04/04/15 0402 04/05/15 0437 04/06/15 0547  AST  37  --  66*  --   --   --   ALT 34  --  63*  --   --   --   ALKPHOS 63  --  92  --   --   --   BILITOT 0.2*  --  0.5  --   --   --   PROT 5.4*  --  5.9*  --   --   --   ALBUMIN 2.4* 2.6* 2.8* 2.8* 2.8* 2.8*   No results for input(s): LIPASE, AMYLASE in the last 168 hours. No results for input(s): AMMONIA in the last 168 hours. CBC:  Recent Labs Lab 03/31/15 0445 04/01/15 0339 04/02/15 0415 04/03/15 0254 04/04/15 0402  WBC 7.5 6.9 7.5  8.6 8.0  HGB 10.0* 10.5* 11.5* 11.7* 11.5*  HCT 31.4* 32.6* 35.4* 36.5 35.8*  MCV 93.2 93.1 93.4 94.1 95.2  PLT 273 297 300 344 376   Cardiac Enzymes: No results for input(s): CKTOTAL, CKMB, CKMBINDEX, TROPONINI in the last 168 hours. BNP (last 3 results)  Recent Labs  03/25/15 2140  BNP 146.1*    ProBNP (last 3 results) No results for input(s): PROBNP in the last 8760 hours.  CBG:  Recent Labs Lab 04/05/15 1125 04/05/15 1645 04/05/15 2106 04/06/15 0803 04/06/15 1153  GLUCAP 116* 90 96 99 100*    No results found for this or any previous visit (from the past 240 hour(s)).   Studies: No results found.  Scheduled Meds: . atorvastatin  80 mg Oral q1800  . carbamide peroxide  5 drop Both Ears BID  . chlorhexidine  60 mL Topical Once  . chlorhexidine  60 mL Topical Once  . enoxaparin (LOVENOX) injection  1.5 mg/kg Subcutaneous Q24H  . fluticasone  1 spray Each Nare Daily  . levETIRAcetam  500 mg Oral BID  . magnesium oxide  400 mg Oral Daily  . metoprolol tartrate  12.5 mg Oral BID  . warfarin  7.5 mg Oral ONCE-1800  . warfarin   Does not apply Once  . Warfarin - Pharmacist Dosing Inpatient   Does not apply q1800   Continuous Infusions: . sodium chloride 50 mL/hr at 04/02/15 0635  . sodium chloride 50 mL/hr at 04/02/15 0636       Time spent: [place], [facility] Pager [phone]. If 7PM-7AM, please contact night-coverage at [email or web address], password [facility] 04/06/2015, 4:50 PM  LOS: 12 days

## 2015-04-06 NOTE — Progress Notes (Signed)
SUBJECTIVE: The patient is doing well today  Walked yesterday and feels better not sleeping well  \    Wa;CURRENT MEDICATIONS: . albuterol  2.5 mg Nebulization BID  . atorvastatin  80 mg Oral q1800  . carbamide peroxide  5 drop Both Ears BID  . chlorhexidine  60 mL Topical Once  . chlorhexidine  60 mL Topical Once  . enoxaparin (LOVENOX) injection  1.5 mg/kg Subcutaneous Q24H  . fluticasone  1 spray Each Nare Daily  . levETIRAcetam  500 mg Oral BID  . magnesium oxide  400 mg Oral Daily  . metoprolol tartrate  12.5 mg Oral BID  . warfarin   Does not apply Once  . Warfarin - Pharmacist Dosing Inpatient   Does not apply q1800   . sodium chloride 50 mL/hr at 04/02/15 0635  . sodium chloride 50 mL/hr at 04/02/15 0636    OBJECTIVE: Physical Exam: Filed Vitals:   04/05/15 2013 04/05/15 2325 04/06/15 0449 04/06/15 0730  BP:  102/62 123/77   Pulse:  94    Temp:  98.2 F (36.8 C) 97.9 F (36.6 C)   TempSrc:  Oral Oral   Resp:  16 18   Height:   5\' 1"  (1.549 m)   Weight:   211 lb (95.709 kg)   SpO2: 96% 94% 94% 99%    Intake/Output Summary (Last 24 hours) at 04/06/15 0903 Last data filed at 04/06/15 0400  Gross per 24 hour  Intake   1080 ml  Output   3500 ml  Net  -2420 ml    Telemetry reveals sinus rhythm  Well developed and nourished in no acute distress HENT normal Neck supple   B ronchi  Regular rate and rhythm, no murmurs or gallops Abd-soft with active BS No Clubbing cyanosis edema  Fat feet Skin-warm and dry A & Oriented  Grossly normal sensory and motor function   LABS: Basic Metabolic Panel:  Recent Labs  04/05/15 0437 04/06/15 0547  NA 139 140  K 4.1 3.6  CL 103 102  CO2 28 30  GLUCOSE 101* 114*  BUN 11 8  CREATININE 0.59 0.62  CALCIUM 8.7* 8.8*  MG 2.1 2.1  PHOS 4.6 4.1   INR 1.43    Liver Function Tests:  Recent Labs  04/05/15 0437 04/06/15 0547  ALBUMIN 2.8* 2.8*   CBC:  Recent Labs  04/04/15 0402  WBC 8.0  HGB  11.5*  HCT 35.8*  MCV 95.2  PLT 376   D-Dimer: No results for input(s): DDIMER in the last 72 hours.  RADIOLOGY: Ct Angio Chest Pe W/cm &/or Wo Cm 04/02/2015   CLINICAL DATA:  Dyspnea.  History of deep venous thrombosis.  EXAM: CT ANGIOGRAPHY CHEST WITH CONTRAST  TECHNIQUE: Multidetector CT imaging of the chest was performed using the standard protocol during bolus administration of intravenous contrast. Multiplanar CT image reconstructions and MIPs were obtained to evaluate the vascular anatomy.  CONTRAST:  37mL OMNIPAQUE IOHEXOL 350 MG/ML SOLN  COMPARISON:  Chest x-ray dated 04/01/2015  FINDINGS: There are no pulmonary emboli. Heart size is normal. No hilar or mediastinal adenopathy. Small amount of calcification in the left anterior descending coronary artery.  Thoracic scoliosis. No acute osseous abnormality. No effusions. 8 mm ground-glass ill-defined abnormality in the right upper lobe. Lungs are otherwise clear. The visualized portion of the upper abdomen is normal.  Review of the MIP images confirms the above findings.  IMPRESSION: 1. No pulmonary emboli. 2. There is an 8 mm  ground-glass ill-defined area of abnormal density at the right lung apex best seen on image 25 of series 407. This is indeterminate in etiology.  If the patient is at high risk for bronchogenic carcinoma, follow-up chest CT at 3-63months is recommended. If the patient is at low risk for bronchogenic carcinoma, follow-up chest CT at 6-12 months is recommended. This recommendation follows the consensus statement: Guidelines for Management of Small Pulmonary Nodules Detected on CT Scans: A Statement from the [place] as published in Radiology 2005; 237:395-400.   Electronically Signed   By: Lorriane Shire M.D.   On: 04/02/2015 14:36   Dg Chest Port 1 View 04/01/2015   CLINICAL DATA:  Cough.  History of hypertension.  Initial encounter.  EXAM: PORTABLE CHEST - 1 VIEW  COMPARISON:  03/25/2015 radiographs.  FINDINGS: 1129 hr.  Interval extubation with removal of the nasogastric tube and central line. There is improved aeration of the lung bases which are now clear. The heart size and mediastinal contours are stable. There is no pleural effusion or pneumothorax.  IMPRESSION: No active cardiopulmonary process following extubation.   Electronically Signed   By: Richardean Sale M.D.   On: 04/01/2015 12:22   ASSESSMENT AND PLAN:  Principal Problem:   Cardiac arrest Active Problems:   Status epilepticus   Seizure   Essential hypertension   Hyperlipemia   Cough   Dyspnea   DVT (deep venous thrombosis)  1.  Resuscitated VF arrest Cardiac cath with no obstructive CAD and normal LV function.  Cardiac MRI with prior subendocardial infarct.    2.  HTN Stable    3.  Shortness of breath   4. DVT   On schedule for monday   Ambulate as much as possible Stop asa-done

## 2015-04-07 LAB — GLUCOSE, CAPILLARY
GLUCOSE-CAPILLARY: 93 mg/dL (ref 65–99)
Glucose-Capillary: 105 mg/dL — ABNORMAL HIGH (ref 65–99)
Glucose-Capillary: 104 mg/dL — ABNORMAL HIGH (ref 65–99)
Glucose-Capillary: 140 mg/dL — ABNORMAL HIGH (ref 65–99)
Glucose-Capillary: 104 mg/dL — ABNORMAL HIGH (ref 65–99)

## 2015-04-07 LAB — RENAL FUNCTION PANEL
Albumin: 2.9 g/dL — ABNORMAL LOW (ref 3.5–5.0)
Anion gap: 5 (ref 5–15)
BUN: 10 mg/dL (ref 6–20)
CO2: 30 mmol/L (ref 22–32)
CREATININE: 0.55 mg/dL (ref 0.44–1.00)
Calcium: 8.6 mg/dL — ABNORMAL LOW (ref 8.9–10.3)
Chloride: 103 mmol/L (ref 101–111)
GFR calc Af Amer: >60 mL/min (ref >60)
Glucose, Bld: 109 mg/dL — ABNORMAL HIGH (ref 65–99)
POTASSIUM: 3.8 mmol/L (ref 3.5–5.1)
Phosphorus: 4.2 mg/dL (ref 2.5–4.6)
Sodium: 138 mmol/L (ref 135–145)

## 2015-04-07 LAB — PROTIME-INR
INR: 2.04 — ABNORMAL HIGH (ref 0.00–1.49)
PROTHROMBIN TIME: 22.9 s — AB (ref 11.6–15.2)

## 2015-04-07 LAB — MAGNESIUM: Magnesium: 2 mg/dL (ref 1.7–2.4)

## 2015-04-07 MED ORDER — WARFARIN SODIUM 5 MG PO TABS
5.0000 mg | ORAL_TABLET | Freq: Once | ORAL | Status: AC
  Administered 2015-04-07: 5 mg via ORAL
  Filled 2015-04-07: qty 1

## 2015-04-07 MED ORDER — POTASSIUM CHLORIDE CRYS ER 20 MEQ PO TBCR
40.0000 meq | EXTENDED_RELEASE_TABLET | Freq: Once | ORAL | Status: AC
  Administered 2015-04-07: 40 meq via ORAL
  Filled 2015-04-07 (×2): qty 2

## 2015-04-07 MED ORDER — ZOLPIDEM TARTRATE 5 MG PO TABS
5.0000 mg | ORAL_TABLET | Freq: Every evening | ORAL | Status: DC | PRN
  Administered 2015-04-07: 5 mg via ORAL
  Administered 2015-04-08: 2.5 mg via ORAL
  Filled 2015-04-07 (×2): qty 1

## 2015-04-07 MED ORDER — ZOLPIDEM TARTRATE 5 MG PO TABS: 5.0000 mg | ORAL_TABLET | Freq: Every day | ORAL | Status: DC

## 2015-04-07 NOTE — Progress Notes (Signed)
    Note from Dr. Caryl Comes, or to physiology reviewed. Plan for defibrillator 8/15.  Candee Furbish, MD

## 2015-04-07 NOTE — Progress Notes (Signed)
ANTICOAGULATION CONSULT NOTE - Follow Up Consult  Pharmacy Consult for Lovenox and Coumadin Indication: DVT  Allergies  Allergen Reactions  . Toradol [Ketorolac Tromethamine] Itching and Rash  . Tramadol Itching and Rash  . Zofran [Ondansetron Hcl] Itching and Rash    Patient Measurements: Height: 5\' 1"  (154.9 cm) Weight: 219 lb (99.338 kg) IBW/kg (Calculated) : 47.8  Vital Signs: Temp: 98.1 F (36.7 C) (08/13 0503) Temp Source: Oral (08/13 0503) BP: 110/62 mmHg (08/13 0503) Pulse Rate: 83 (08/13 0503)  Labs:  Recent Labs  04/05/15 0437 04/06/15 0547 04/07/15 0544  LABPROT 13.5 17.5* 22.9*  INR 1.01 1.43 2.04*  CREATININE 0.59 0.62 0.55    Estimated Creatinine Clearance: 93.9 mL/min (by C-G formula based on Cr of 0.55).   Medications:  Scheduled:  . atorvastatin  80 mg Oral q1800  . carbamide peroxide  5 drop Both Ears BID  . chlorhexidine  60 mL Topical Once  . chlorhexidine  60 mL Topical Once  . enoxaparin (LOVENOX) injection  1.5 mg/kg Subcutaneous Q24H  . fluticasone  1 spray Each Nare Daily  . levETIRAcetam  500 mg Oral BID  . magnesium oxide  400 mg Oral Daily  . metoprolol tartrate  12.5 mg Oral BID  . potassium chloride  40 mEq Oral Once  . warfarin  5 mg Oral ONCE-1800  . warfarin   Does not apply Once  . Warfarin - Pharmacist Dosing Inpatient   Does not apply q1800   Infusions:  . sodium chloride 50 mL/hr at 04/02/15 0635  . sodium chloride 50 mL/hr at 04/02/15 0636    Assessment: 49 yo F with new RLE DVT.  Pt was initially on heparin pending ICD placement.  Decision has been made to start Coumadin with plans for ICD placement when INR >2 and off parenteral anticoagulant.  Today is day #4 of 5d minimum overlap for Coumadin initiation for VTE.  INR today 2.04 (up from 1.43 yesterday). Give 5mg  dose x1 tonight @1800   Goal of Therapy:  INR 2-3 Anti-Xa level 0.6-1 units/ml 4hrs after LMWH dose given Monitor platelets by anticoagulation  protocol: Yes   Plan:  Coumadin 5mg  x1 tonight  Continue Lovenox 145mg  [place] daily  Gavan Nordby C. Lennox Grumbles, PharmD Pharmacy Resident  Pager: [phone] 04/07/2015 2:04 PM

## 2015-04-07 NOTE — Progress Notes (Signed)
[facility] PROGRESS NOTE  Claire Mcdonald BWI:[id number] DOB: 29-Feb-1966 DOA: 03/25/2015 PCP: Golden Pop, MD  BRIEF NARRATIVE 49 year old obese female with history of hypertension, hyperlipidemia, anxiety, depression,was at work and suddenly collapsed. Patient received shock 2. She was intubated by EMS and transferred to [facility]. She was then transferred to [facility] for further evaluation. Patient extubated and cardiac catheterization done showed mild nonobstructive CAD with normal LV function. Echo showing EF of 55-60% with mild LVH and no wall motion abnormality. No prior history of syncope or seizures. Appears patient may have had status epilepticus. No family history of sudden cardiac death. Patient is a nonsmoker, does not drink or use illicit drug.   Assessment/Plan: Resuscitated VF arrest Possibly in the setting of acute seizures. Cardiac MRI with prior subendocardial infarct. Flexion I challenged by Dr. Caryl Comes and completed.  keep potassium >4 and magnesium >1.8. Cannot drive for 6 months. - ICD placement for secondary prevention possibly on 8/15.   Possible status epilepticus contributing to VF arrest Seen by neurology and started on Keppra. EEG unremarkable.no prior history of seizures.  DVT of right lower leg Patient on Lovenox and Coumadin. Plan on AICD once INR therapeutic. INR today pending. CT angiogram of the chest negative for PE.  Dyslipidemia Continue statin  Hypertension Stable. Continue metoprolol  8 mm nonspecified groundglass density on right upper lung Seen on CT chest. Recommend follow-up chest CT in 6-12 months as patient is low risk.  DVT prophylaxis: Lovenox with Coumadin  Code Status:full code Family Communication: None at bedside Disposition Plan:home possibly next week after ICD placed   Consultants:  Neurology  Cardiology  AP  Critical care  Procedures:  head CT  EEG  Cardiac cath  2-D echo  CT angiogram of the chest  MRI  with cardio morphology  Antibiotics:  none  HPI/Subjective: Patient seen and examined. Sleepy. Reports ambien helped her sleep better past night  Objective: Filed Vitals:   04/07/15 0503  BP: 110/62  Pulse: 83  Temp: 98.1 F (36.7 C)  Resp:     Intake/Output Summary (Last 24 hours) at 04/07/15 1155 Last data filed at 04/07/15 0506  Gross per 24 hour  Intake    840 ml  Output      0 ml  Net    840 ml   Filed Weights   04/05/15 0824 04/06/15 0449 04/07/15 0503  Weight: 95.482 kg (210 lb 8 oz) 95.709 kg (211 lb) 99.338 kg (219 lb)    Exam:   General:   Not n  distress    HEENT:  moist oral mucosa    chest: Clear to auscultation bilaterally   CVS: Normal S1 and S2, no murmurs rub or gallop  GI: Soft, nondistended, nontender, bowel sounds present  Musculoskeletal : Warm, no edema    Data Reviewed: Basic Metabolic Panel:  Recent Labs Lab 04/02/15 0415 04/03/15 0254 04/04/15 0402 04/05/15 0437 04/06/15 0547 04/07/15 0544  NA 138 136 140 139 140 138  K 3.8 3.9 3.9 4.1 3.6 3.8  CL 101 98* 103 103 102 103  CO2 28 30 31 28 30 30   GLUCOSE 106* 115* 102* 101* 114* 109*  BUN 7 12 15 11 8 10   CREATININE 0.50 0.62 0.68 0.59 0.62 0.55  CALCIUM 8.7* 8.6* 8.7* 8.7* 8.8* 8.6*  MG 2.1 1.9 1.9 2.1 2.1 2.0  PHOS 4.6  --  4.1 4.6 4.1 4.2   Liver Function Tests:  Recent Labs Lab 04/01/15 0339  04/03/15 0254  04/04/15 0402 04/05/15 0437 04/06/15 0547 04/07/15 0544  AST 37  --  66*  --   --   --   --   ALT 34  --  63*  --   --   --   --   ALKPHOS 63  --  92  --   --   --   --   BILITOT 0.2*  --  0.5  --   --   --   --   PROT 5.4*  --  5.9*  --   --   --   --   ALBUMIN 2.4*  < > 2.8* 2.8* 2.8* 2.8* 2.9*  < > = values in this interval not displayed. No results for input(s): LIPASE, AMYLASE in the last 168 hours. No results for input(s): AMMONIA in the last 168 hours. CBC:  Recent Labs Lab 04/01/15 0339 04/02/15 0415 04/03/15 0254 04/04/15 0402  WBC  6.9 7.5 8.6 8.0  HGB 10.5* 11.5* 11.7* 11.5*  HCT 32.6* 35.4* 36.5 35.8*  MCV 93.1 93.4 94.1 95.2  PLT 297 300 344 376   Cardiac Enzymes: No results for input(s): CKTOTAL, CKMB, CKMBINDEX, TROPONINI in the last 168 hours. BNP (last 3 results)  Recent Labs  03/25/15 2140  BNP 146.1*    ProBNP (last 3 results) No results for input(s): PROBNP in the last 8760 hours.  CBG:  Recent Labs Lab 04/06/15 0803 04/06/15 1153 04/06/15 1649 04/06/15 2120 04/07/15 0742  GLUCAP 99 100* 92 105* 104*    No results found for this or any previous visit (from the past 240 hour(s)).   Studies: No results found.  Scheduled Meds: . atorvastatin  80 mg Oral q1800  . carbamide peroxide  5 drop Both Ears BID  . chlorhexidine  60 mL Topical Once  . chlorhexidine  60 mL Topical Once  . enoxaparin (LOVENOX) injection  1.5 mg/kg Subcutaneous Q24H  . fluticasone  1 spray Each Nare Daily  . levETIRAcetam  500 mg Oral BID  . magnesium oxide  400 mg Oral Daily  . metoprolol tartrate  12.5 mg Oral BID  . warfarin   Does not apply Once  . Warfarin - Pharmacist Dosing Inpatient   Does not apply q1800   Continuous Infusions: . sodium chloride 50 mL/hr at 04/02/15 0635  . sodium chloride 50 mL/hr at 04/02/15 0636       Time spent: [place], [place] [facility] Pager [phone]. If 7PM-7AM, please contact night-coverage at [email or web address], password [facility] 04/07/2015, 11:55 AM  LOS: 13 days

## 2015-04-08 LAB — RENAL FUNCTION PANEL
Albumin: 2.7 g/dL — ABNORMAL LOW (ref 3.5–5.0)
Anion gap: 7 (ref 5–15)
BUN: 13 mg/dL (ref 6–20)
CO2: 28 mmol/L (ref 22–32)
CREATININE: 0.57 mg/dL (ref 0.44–1.00)
Calcium: 8.6 mg/dL — ABNORMAL LOW (ref 8.9–10.3)
Chloride: 103 mmol/L (ref 101–111)
GFR calc non Af Amer: >60 mL/min (ref >60)
Glucose, Bld: 107 mg/dL — ABNORMAL HIGH (ref 65–99)
POTASSIUM: 3.9 mmol/L (ref 3.5–5.1)
Phosphorus: 4 mg/dL (ref 2.5–4.6)
Sodium: 138 mmol/L (ref 135–145)

## 2015-04-08 LAB — URINALYSIS, ROUTINE W REFLEX MICROSCOPIC
Bilirubin Urine: NEGATIVE
Glucose, UA: NEGATIVE mg/dL
Ketones, ur: NEGATIVE mg/dL
NITRITE: POSITIVE — AB
PH: 6.5 (ref 5.0–8.0)
Protein, ur: NEGATIVE mg/dL
SPECIFIC GRAVITY, URINE: 1.023 (ref 1.005–1.030)
Urobilinogen, UA: 0.2 mg/dL (ref 0.0–1.0)

## 2015-04-08 LAB — URINE MICROSCOPIC-ADD ON

## 2015-04-08 LAB — GLUCOSE, CAPILLARY
GLUCOSE-CAPILLARY: 104 mg/dL — AB (ref 65–99)
GLUCOSE-CAPILLARY: 94 mg/dL (ref 65–99)
Glucose-Capillary: 112 mg/dL — ABNORMAL HIGH (ref 65–99)
Glucose-Capillary: 90 mg/dL (ref 65–99)

## 2015-04-08 LAB — MAGNESIUM: MAGNESIUM: 2 mg/dL (ref 1.7–2.4)

## 2015-04-08 LAB — PROTIME-INR
INR: 1.79 — AB (ref 0.00–1.49)
PROTHROMBIN TIME: 20.8 s — AB (ref 11.6–15.2)

## 2015-04-08 MED ORDER — SODIUM CHLORIDE 0.9 % IV SOLN: INTRAVENOUS | Status: DC

## 2015-04-08 MED ORDER — CEFAZOLIN SODIUM-DEXTROSE 2-3 GM-% IV SOLR
2.0000 g | INTRAVENOUS | Status: DC
  Filled 2015-04-08: qty 50

## 2015-04-08 MED ORDER — SODIUM CHLORIDE 0.9 % IR SOLN
80.0000 mg | Status: DC
  Filled 2015-04-08: qty 2

## 2015-04-08 MED ORDER — WARFARIN SODIUM 7.5 MG PO TABS
7.5000 mg | ORAL_TABLET | Freq: Once | ORAL | Status: AC
  Administered 2015-04-08: 7.5 mg via ORAL
  Filled 2015-04-08: qty 1

## 2015-04-08 MED ORDER — DEXTROSE 5 % IV SOLN
1.0000 g | INTRAVENOUS | Status: DC
  Administered 2015-04-08 – 2015-04-11 (×4): 1 g via INTRAVENOUS
  Filled 2015-04-08 (×5): qty 10

## 2015-04-08 NOTE — Progress Notes (Signed)
CW went to assess pt having a walker at home but pt is sleeping. CM will follow tomorrow for DME needs.

## 2015-04-08 NOTE — Progress Notes (Signed)
ANTICOAGULATION CONSULT NOTE - Follow Up Consult  Pharmacy Consult for Lovenox and Coumadin Indication: DVT  Allergies  Allergen Reactions  . Toradol [Ketorolac Tromethamine] Itching and Rash  . Tramadol Itching and Rash  . Zofran [Ondansetron Hcl] Itching and Rash    Patient Measurements: Height: 5\' 1"  (154.9 cm) Weight: 210 lb 4.8 oz (95.391 kg) IBW/kg (Calculated) : 47.8  Vital Signs: Temp: 98.3 Claire Mcdonald (36.8 C) (08/14 0606) Temp Source: Oral (08/14 0606) BP: 117/64 mmHg (08/14 0606) Pulse Rate: 87 (08/14 0606)  Labs:  Recent Labs  04/06/15 0547 04/07/15 0544 04/08/15 0543  LABPROT 17.5* 22.9* Claire.8*  INR 1.43 2.04* 1.79*  CREATININE 0.62 0.55 0.57    Estimated Creatinine Clearance: 91.7 mL/min (by C-G formula based on Cr of 0.57).   Medications:  Scheduled:  . [START ON 04/09/2015] sodium chloride   Intravenous To Cath  . [START ON 04/09/2015] sodium chloride   Intravenous To Cath  . atorvastatin  80 mg Oral q1800  . carbamide peroxide  5 drop Both Ears BID  . [START ON 04/09/2015]  ceFAZolin (ANCEF) IV  2 g Intravenous To Cath  . chlorhexidine  60 mL Topical Once  . chlorhexidine  60 mL Topical Once  . enoxaparin (LOVENOX) injection  1.5 mg/kg Subcutaneous Q24H  . fluticasone  1 spray Each Nare Daily  . [START ON 04/09/2015] gentamicin irrigation  80 mg Irrigation To Cath  . levETIRAcetam  500 mg Oral BID  . magnesium oxide  400 mg Oral Daily  . metoprolol tartrate  12.5 mg Oral BID  . warfarin  7.5 mg Oral ONCE-1800  . warfarin   Does not apply Once  . Warfarin - Pharmacist Dosing Inpatient   Does not apply q1800   Infusions:  . sodium chloride 50 mL/hr at 04/02/15 0635  . sodium chloride 50 mL/hr at 04/02/15 0636    Assessment: 49 yo Claire Mcdonald with new RLE DVT.  Pt was initially on heparin pending ICD placement.  Decision has been made to start Coumadin with plans for ICD placement when INR >2 and off parenteral anticoagulant.  Today is day #4 of 5d minimum  overlap for Coumadin initiation for VTE. Give 5mg  dose last night (8/13) INR today down dropped to 1.79. Will give 7.5mg  x1 tonight   Goal of Therapy:  INR 2-3 Anti-Xa level 0.6-1 units/ml 4hrs after LMWH dose given Monitor platelets by anticoagulation protocol: Yes   Plan:  Coumadin 7.5mg  x1 tonight  Continue Lovenox 145mg  [place] daily  Ahaan Zobrist C. Lennox Grumbles, PharmD Pharmacy Resident  Pager: [phone] 04/08/2015 11:26 AM

## 2015-04-08 NOTE — Progress Notes (Signed)
    Note from Dr. Caryl Comes, or to physiology reviewed. Plan for defibrillator 8/15, tomorrow.  Candee Furbish, MD

## 2015-04-08 NOTE — Care Management Note (Signed)
Case Management Note  Patient Details  Name: Claire Mcdonald MRN: [id number] Date of Birth: 01/28/1966  Subjective/Objective:                  V-Fib/V-tach cardiac arrest and status epilepticus  Action/Plan: CM spoke to patient at the bedside and pt states that she lives at home with family and has enough support. CM spoke to pt about DME and pt states that she may have a walker at home and will verify with her children. CM offered choice to pt, Patient states that if she does not have a walker, then she would like for [facility] to provider her equipment. Pt states that she is scheduled for ICD tomorrow and possible discharge home on 04/10/15.  CM, Jacqlyn Krauss,  noted that outreach to [facility] in [place] was contacted. Still awaiting response per the note.    Expected Discharge Date:  04/10/15           Expected Discharge Plan:  [place]  In-House Referral:     Discharge planning Services  CM Consult  Post Acute Care Choice:    Choice offered to:     DME Arranged:    DME Agency:     HH Arranged:    [place] Agency:     Status of Service:  In process, will continue to follow  Medicare Important Message Given:    Date Medicare IM Given:    Medicare IM give by:    Date Additional Medicare IM Given:    Additional Medicare Important Message give by:     If discussed at [place] of Stay Meetings, dates discussed:    Additional Comments:  Guido Sander, RN 04/08/2015, 1:54 PM

## 2015-04-08 NOTE — Progress Notes (Signed)
[facility] PROGRESS NOTE  AERIS HERSMAN XBM:[id number] DOB: 07-Jan-1966 DOA: 03/25/2015 PCP: Golden Pop, MD  BRIEF NARRATIVE 49 year old obese female with history of hypertension, hyperlipidemia, anxiety, depression,was at work and suddenly collapsed. Patient received shock 2. She was intubated by EMS and transferred to [facility]. She was then transferred to [facility] for further evaluation. Patient extubated and cardiac catheterization done showed mild nonobstructive CAD with normal LV function. Echo showing EF of 55-60% with mild LVH and no wall motion abnormality. No prior history of syncope or seizures. Appears patient may have had status epilepticus. No family history of sudden cardiac death. Patient is a nonsmoker, does not drink or use illicit drug.   Assessment/Plan: Resuscitated VF arrest Possibly in the setting of acute seizures. Cardiac MRI with prior subendocardial infarct. Flexion I challenged by Dr. Caryl Comes and completed.  keep potassium >4 and magnesium >1.8. Cannot drive for 6 months. - ICD placement for secondary prevention  on 8/15. INR nearing therapeutic range.  Possible status epilepticus contributing to VF arrest Seen by neurology and started on Keppra. EEG unremarkable.no prior history of seizures.  DVT of right lower leg Patient on Lovenox and Coumadin. Plan on AICD once INR therapeutic. INR near therapeutic. CT angiogram of the chest negative for PE.  Dyslipidemia Continue statin  Hypertension Stable. Continue metoprolol  8 mm nonspecified groundglass density on right upper lung Seen on CT chest. Recommend follow-up chest CT in 6-12 months as patient is low risk.  DVT prophylaxis: Lovenox with Coumadin  Code Status:full code Family Communication: None at bedside Disposition Plan:home possibly on 8/16 after ICD placed tomorrow   Consultants:  Neurology  Cardiology  AP  Critical care  Procedures:  head CT  EEG  Cardiac cath  2-D echo  CT  angiogram of the chest  MRI with cardio morphology  Antibiotics:  none  HPI/Subjective: Patient seen and examined. Sleepy. No overnight issues  Objective: Filed Vitals:   04/08/15 0606  BP: 117/64  Pulse: 87  Temp: 98.3 F (36.8 C)  Resp: 16    Intake/Output Summary (Last 24 hours) at 04/08/15 1107 Last data filed at 04/08/15 0000  Gross per 24 hour  Intake    360 ml  Output    350 ml  Net     10 ml   Filed Weights   04/06/15 0449 04/07/15 0503 04/08/15 0606  Weight: 95.709 kg (211 lb) 99.338 kg (219 lb) 95.391 kg (210 lb 4.8 oz)    Exam:   General:   Not in  distress , sleepy   HEENT:  moist oral mucosa    chest: Clear to auscultation bilaterally   CVS: Normal S1 and S2, no murmurs rub or gallop  GI: Soft, nondistended, nontender,  Musculoskeletal : Warm, no edema    Data Reviewed: Basic Metabolic Panel:  Recent Labs Lab 04/04/15 0402 04/05/15 0437 04/06/15 0547 04/07/15 0544 04/08/15 0543  NA 140 139 140 138 138  K 3.9 4.1 3.6 3.8 3.9  CL 103 103 102 103 103  CO2 31 28 30 30 28   GLUCOSE 102* 101* 114* 109* 107*  BUN 15 11 8 10 13   CREATININE 0.68 0.59 0.62 0.55 0.57  CALCIUM 8.7* 8.7* 8.8* 8.6* 8.6*  MG 1.9 2.1 2.1 2.0 2.0  PHOS 4.1 4.6 4.1 4.2 4.0   Liver Function Tests:  Recent Labs Lab 04/03/15 0254 04/04/15 0402 04/05/15 0437 04/06/15 0547 04/07/15 0544 04/08/15 0543  AST 66*  --   --   --   --   --  ALT 63*  --   --   --   --   --   ALKPHOS 92  --   --   --   --   --   BILITOT 0.5  --   --   --   --   --   PROT 5.9*  --   --   --   --   --   ALBUMIN 2.8* 2.8* 2.8* 2.8* 2.9* 2.7*   No results for input(s): LIPASE, AMYLASE in the last 168 hours. No results for input(s): AMMONIA in the last 168 hours. CBC:  Recent Labs Lab 04/02/15 0415 04/03/15 0254 04/04/15 0402  WBC 7.5 8.6 8.0  HGB 11.5* 11.7* 11.5*  HCT 35.4* 36.5 35.8*  MCV 93.4 94.1 95.2  PLT 300 344 376   Cardiac Enzymes: No results for input(s):  CKTOTAL, CKMB, CKMBINDEX, TROPONINI in the last 168 hours. BNP (last 3 results)  Recent Labs  03/25/15 2140  BNP 146.1*    ProBNP (last 3 results) No results for input(s): PROBNP in the last 8760 hours.  CBG:  Recent Labs Lab 04/07/15 0742 04/07/15 1203 04/07/15 1647 04/07/15 2128 04/08/15 0743  GLUCAP 104* 140* 93 104* 104*    No results found for this or any previous visit (from the past 240 hour(s)).   Studies: No results found.  Scheduled Meds: . [START ON 04/09/2015] sodium chloride   Intravenous To Cath  . [START ON 04/09/2015] sodium chloride   Intravenous To Cath  . atorvastatin  80 mg Oral q1800  . carbamide peroxide  5 drop Both Ears BID  . [START ON 04/09/2015]  ceFAZolin (ANCEF) IV  2 g Intravenous To Cath  . chlorhexidine  60 mL Topical Once  . chlorhexidine  60 mL Topical Once  . enoxaparin (LOVENOX) injection  1.5 mg/kg Subcutaneous Q24H  . fluticasone  1 spray Each Nare Daily  . [START ON 04/09/2015] gentamicin irrigation  80 mg Irrigation To Cath  . levETIRAcetam  500 mg Oral BID  . magnesium oxide  400 mg Oral Daily  . metoprolol tartrate  12.5 mg Oral BID  . warfarin   Does not apply Once  . Warfarin - Pharmacist Dosing Inpatient   Does not apply q1800   Continuous Infusions: . sodium chloride 50 mL/hr at 04/02/15 0635  . sodium chloride 50 mL/hr at 04/02/15 0636       Time spent: [place], [place] [facility] Pager [phone]. If 7PM-7AM, please contact night-coverage at [email or web address], password [facility] 04/08/2015, 11:07 AM  LOS: 14 days

## 2015-04-09 DIAGNOSIS — Z7901 Long term (current) use of anticoagulants: Secondary | ICD-10-CM

## 2015-04-09 DIAGNOSIS — E669 Obesity, unspecified: Secondary | ICD-10-CM | POA: Diagnosis present

## 2015-04-09 DIAGNOSIS — R911 Solitary pulmonary nodule: Secondary | ICD-10-CM | POA: Diagnosis present

## 2015-04-09 DIAGNOSIS — Z0389 Encounter for observation for other suspected diseases and conditions ruled out: Secondary | ICD-10-CM

## 2015-04-09 DIAGNOSIS — R7989 Other specified abnormal findings of blood chemistry: Secondary | ICD-10-CM

## 2015-04-09 DIAGNOSIS — R778 Other specified abnormalities of plasma proteins: Secondary | ICD-10-CM | POA: Diagnosis present

## 2015-04-09 DIAGNOSIS — IMO0001 Reserved for inherently not codable concepts without codable children: Secondary | ICD-10-CM

## 2015-04-09 LAB — PROTIME-INR
INR: 1.83 — AB (ref 0.00–1.49)
PROTHROMBIN TIME: 21.1 s — AB (ref 11.6–15.2)

## 2015-04-09 LAB — MAGNESIUM: MAGNESIUM: 2 mg/dL (ref 1.7–2.4)

## 2015-04-09 LAB — RENAL FUNCTION PANEL
ALBUMIN: 3 g/dL — AB (ref 3.5–5.0)
ANION GAP: 13 (ref 5–15)
BUN: 7 mg/dL (ref 6–20)
CALCIUM: 8.9 mg/dL (ref 8.9–10.3)
CO2: 24 mmol/L (ref 22–32)
CREATININE: 0.54 mg/dL (ref 0.44–1.00)
Chloride: 103 mmol/L (ref 101–111)
GFR calc Af Amer: >60 mL/min (ref >60)
Glucose, Bld: 79 mg/dL (ref 65–99)
PHOSPHORUS: 4.4 mg/dL (ref 2.5–4.6)
Potassium: 4.3 mmol/L (ref 3.5–5.1)
SODIUM: 140 mmol/L (ref 135–145)

## 2015-04-09 LAB — GLUCOSE, CAPILLARY
GLUCOSE-CAPILLARY: 119 mg/dL — AB (ref 65–99)
Glucose-Capillary: 88 mg/dL (ref 65–99)
Glucose-Capillary: 86 mg/dL (ref 65–99)

## 2015-04-09 MED ORDER — WARFARIN SODIUM 4 MG PO TABS: 8.0000 mg | ORAL_TABLET | Freq: Once | ORAL | Status: DC

## 2015-04-09 MED ORDER — WARFARIN SODIUM 10 MG PO TABS
10.0000 mg | ORAL_TABLET | Freq: Once | ORAL | Status: AC
  Administered 2015-04-09: 10 mg via ORAL
  Filled 2015-04-09: qty 1

## 2015-04-09 MED ORDER — CEFAZOLIN SODIUM-DEXTROSE 2-3 GM-% IV SOLR: 2.0000 g | INTRAVENOUS | Status: DC

## 2015-04-09 MED ORDER — SODIUM CHLORIDE 0.9 % IR SOLN: 80.0000 mg | Status: DC

## 2015-04-09 NOTE — Progress Notes (Signed)
CARDIAC REHAB PHASE I   PRE:  Rate/Rhythm: 108 SR  BP:  Sitting: 128/96        SaO2: 96 Ra  MODE:  Ambulation: 960 ft   POST:  Rate/Rhythm: 102  BP:  Sitting: 130/95         SaO2: 97 RA  Pt ambulated 960 ft on RA, rolling walker, assist x1, slow, steady gait, tolerated well. Pt c/o ongoing headache and some mild R wrist/arm discomfort exacerbated by use of walker, denies CP, dizziness, DOE, declined rest stop. Pt states she is not going for her ICD until Wednesday. Pt to recliner after walk, feet elevated, call bell within reach, family at bedside. Will follow.  [id number]  Lenna Sciara, RN, BSN 04/09/2015 10:39 AM

## 2015-04-09 NOTE — Progress Notes (Signed)
Pt walked long distance with RN tonight, no c/o of pain or sob. Etta Quill, RN

## 2015-04-09 NOTE — Progress Notes (Addendum)
ANTICOAGULATION CONSULT NOTE - Follow Up Consult  Pharmacy Consult for Lovenox and Coumadin Indication: DVT  Allergies  Allergen Reactions  . Toradol [Ketorolac Tromethamine] Itching and Rash  . Tramadol Itching and Rash  . Zofran [Ondansetron Hcl] Itching and Rash    Patient Measurements: Height: 5\' 1"  (154.9 cm) Weight: 210 lb 4.8 oz (95.391 kg) IBW/kg (Calculated) : 47.8  Vital Signs: Temp: 98.2 F (36.8 C) (08/15 0500) Temp Source: Oral (08/15 0500) BP: 115/76 mmHg (08/15 0500) Pulse Rate: 81 (08/15 0500)  Labs:  Recent Labs  04/07/15 0544 04/08/15 0543 04/09/15 0325 04/09/15 0328  LABPROT 22.9* 20.8*  --  21.1*  INR 2.04* 1.79*  --  1.83*  CREATININE 0.55 0.57 0.54  --     Estimated Creatinine Clearance: 91.7 mL/min (by C-G formula based on Cr of 0.54).  Assessment: 49 yo F with new RLE DVT.  Pt was initially on heparin pending ICD placement.  Decision has been made to start Coumadin with plans for ICD placement when INR >2 and off parenteral anticoagulant.  Today is day #7 of overlap for Coumadin initiation for VTE.  INR trending up today to 1.8, no bleeding noted. It was noted in progression rounds that ICD has been postponed until Wednesday. Continue lovenox for now  Goal of Therapy:  INR 2-3 Anti-Xa level 0.6-1 units/ml 4hrs after LMWH dose given Monitor platelets by anticoagulation protocol: Yes   Plan:  Coumadin 10mg  x1 tonight  Continue Lovenox 145mg  [place] daily  Erin Hearing PharmD., BCPS Clinical Pharmacist Pager [phone] 04/09/2015 10:51 AM

## 2015-04-09 NOTE — Progress Notes (Signed)
Subjective:  No SOB.   Objective:  Vital Signs in the last 24 hours: Temp:  [98.2 F (36.8 C)-98.4 F (36.9 C)] 98.2 F (36.8 C) (08/15 0500) Pulse Rate:  [79-91] 81 (08/15 0500) Resp:  [14-16] 16 (08/14 2109) BP: (107-115)/(61-81) 115/76 mmHg (08/15 0500) SpO2:  [95 %-97 %] 95 % (08/15 0500)  Intake/Output from previous day:  Intake/Output Summary (Last 24 hours) at 04/09/15 0815 Last data filed at 04/09/15 0500  Gross per 24 hour  Intake    410 ml  Output      0 ml  Net    410 ml    Physical Exam: General appearance: alert, cooperative, no distress and morbidly obese Lungs: scattered expiratory wheezes Heart: regular rate and rhythm Skin: pale, cool, dry Neurologic: Grossly normal   Rate: 80-140  Rhythm: normal sinus rhythm, sinus tachycardia and premature ventricular contractions (PVC)  Lab Results: No results for input(s): WBC, HGB, PLT in the last 72 hours.  Recent Labs  04/08/15 0543 04/09/15 0325  NA 138 140  K 3.9 4.3  CL 103 103  CO2 28 24  GLUCOSE 107* 79  BUN 13 7  CREATININE 0.57 0.54   No results for input(s): TROPONINI in the last 72 hours.  Invalid input(s): CK, MB  Recent Labs  04/09/15 0328  INR 1.83*    Scheduled Meds: . sodium chloride   Intravenous To Cath  . sodium chloride   Intravenous To Cath  . atorvastatin  80 mg Oral q1800  . carbamide peroxide  5 drop Both Ears BID  .  ceFAZolin (ANCEF) IV  2 g Intravenous To Cath  . cefTRIAXone (ROCEPHIN)  IV  1 g Intravenous Q24H  . enoxaparin (LOVENOX) injection  1.5 mg/kg Subcutaneous Q24H  . fluticasone  1 spray Each Nare Daily  . gentamicin irrigation  80 mg Irrigation To Cath  . levETIRAcetam  500 mg Oral BID  . magnesium oxide  400 mg Oral Daily  . metoprolol tartrate  12.5 mg Oral BID  . warfarin   Does not apply Once  . Warfarin - Pharmacist Dosing Inpatient   Does not apply q1800   Continuous Infusions:  PRN Meds:.sodium chloride, acetaminophen (TYLENOL) oral  liquid 160 mg/5 mL, albuterol, HYDROcodone-acetaminophen, HYDROcodone-homatropine, ondansetron (ZOFRAN) IV, phenol, promethazine, sodium chloride, zolpidem   Imaging: Imaging results have been reviewed  Cardiac Studies: MRI 03/30/15 IMPRESSION: 1. Normal left ventricular size, thickness and systolic function (LVEF = 65%) with hypokinesis of the apical inferior wall.  2. Normal right ventricular size, thickness and systolic function (LVEF = 63%) with no regional wall motion abnormalities.  3. Mild left atrial dilatation.  4. Mild mitral and mild tricuspid regurgitation.  5. A subendocardial late gadolinium enhancement in the apical inferior wall is consistent with prior subendocardial infarct.  6. There is no evidence for ARVC.   Assessment/Plan:  49 y/o female patient had a resuscitated arrest at work 03/25/15. Cardiac catheterization showed non obstructive CAD and normal LV function. There is no family history of sudden death or syncope. Cardiac MRI suggests subendocardial infarct, (Takotsubo event ?). Plan was for ICD on 04/02/15 but she became SOB. CTA and LE dopplers done. She is positive for DVT but negative for PE (incidental pulmonary nodule seen). Coumadin initiated with Heparin crossover. Plan is for ICD today-on Coumadin.   Principal Problem:   Cardiac arrest Active Problems:   Troponin level elevated- ? Takotsubo event   Essential hypertension   DVT (deep venous thrombosis)-  RLE   Normal coronary arteries   Chronic anticoagulation- started this admission   Hyperlipemia   Obesity-BMI 40   Pulmonary nodule seen on imaging study   PLAN: ICD Wednesday. No driving for 6 months. F/U CT for pulmonary nodule in 6 months. Not sure about seizure diagnosis but Neurology recommended Keppra at discharge and Neurology f/u 2-4 weeks after discharge. Antibiotics per internal medicine (possible UTI by UA-culture pending).   Kerin Ransom PA-C 04/09/2015, 8:15  AM [phone]  Pt s inR still subtheraapeutic so will plan ICD on Wednesday Reviewed with pt Needs to ambulate

## 2015-04-09 NOTE — Discharge Instructions (Addendum)
Information on my medicine - Coumadin®   (Warfarin) ° °This medication education was reviewed with me or my healthcare representative as part of my discharge preparation.  The pharmacist that spoke with me during my hospital stay was:  Wilson, Frank Rhea, RPH ° °Why was Coumadin prescribed for you? °Coumadin was prescribed for you because you have a blood clot or a medical condition that can cause an increased risk of forming blood clots. Blood clots can cause serious health problems by blocking the flow of blood to the heart, lung, or brain. Coumadin can prevent harmful blood clots from forming. °As a reminder your indication for Coumadin is:   Deep Vein Thrombosis Treatment ° °What test will check on my response to Coumadin? °While on Coumadin (warfarin) you will need to have an INR test regularly to ensure that your dose is keeping you in the desired range. The INR (international normalized ratio) number is calculated from the result of the laboratory test called prothrombin time (PT). ° °If an INR APPOINTMENT HAS NOT ALREADY BEEN MADE FOR YOU please schedule an appointment to have this lab work done by your health care provider within 7 days. °Your INR goal is usually a number between:  2 to 3 or your provider may give you a more narrow range like 2-2.5.  Ask your health care provider during an office visit what your goal INR is. ° °What  do you need to  know  About  COUMADIN? °Take Coumadin (warfarin) exactly as prescribed by your healthcare provider about the same time each day.  DO NOT stop taking without talking to the doctor who prescribed the medication.  Stopping without other blood clot prevention medication to take the place of Coumadin may increase your risk of developing a new clot or stroke.  Get refills before you run out. ° °What do you do if you miss a dose? °If you miss a dose, take it as soon as you remember on the same day then continue your regularly scheduled regimen the next day.  Do not  take two doses of Coumadin at the same time. ° °Important Safety Information °A possible side effect of Coumadin (Warfarin) is an increased risk of bleeding. You should call your healthcare provider right away if you experience any of the following: °? Bleeding from an injury or your nose that does not stop. °? Unusual colored urine (red or dark brown) or unusual colored stools (red or black). °? Unusual bruising for unknown reasons. °? A serious fall or if you hit your head (even if there is no bleeding). ° °Some foods or medicines interact with Coumadin® (warfarin) and might alter your response to warfarin. To help avoid this: °? Eat a balanced diet, maintaining a consistent amount of Vitamin K. °? Notify your provider about major diet changes you plan to make. °? Avoid alcohol or limit your intake to 1 drink for women and 2 drinks for men per day. °(1 drink is 5 oz. wine, 12 oz. beer, or 1.5 oz. liquor.) ° °Make sure that ANY health care provider who prescribes medication for you knows that you are taking Coumadin (warfarin).  Also make sure the healthcare provider who is monitoring your Coumadin knows when you have started a new medication including herbals and non-prescription products. ° °Coumadin® (Warfarin)  Major Drug Interactions  °Increased Warfarin Effect Decreased Warfarin Effect  °Alcohol (large quantities) °Antibiotics (esp. Septra/Bactrim, Flagyl, Cipro) °Amiodarone (Cordarone) °Aspirin (ASA) °Cimetidine (Tagamet) °Megestrol (Megace) °NSAIDs (ibuprofen, naproxen, etc.) °  Piroxicam (Feldene) Propafenone (Rythmol SR) Propranolol (Inderal) Isoniazid (INH) Posaconazole (Noxafil) Barbiturates (Phenobarbital) Carbamazepine (Tegretol) Chlordiazepoxide (Librium) Cholestyramine (Questran) Griseofulvin Oral Contraceptives Rifampin Sucralfate (Carafate) Vitamin K   Coumadin (Warfarin) Major Herbal Interactions  Increased Warfarin Effect Decreased Warfarin Effect  Garlic Ginseng Ginkgo biloba  Coenzyme Q10 Green tea St. Johns wort    Coumadin (Warfarin) FOOD Interactions  Eat a consistent number of servings per week of foods HIGH in Vitamin K (1 serving =  cup)  Collards (cooked, or boiled & drained) Kale (cooked, or boiled & drained) Mustard greens (cooked, or boiled & drained) Parsley *serving size only =  cup Spinach (cooked, or boiled & drained) Swiss chard (cooked, or boiled & drained) Turnip greens (cooked, or boiled & drained)  Eat a consistent number of servings per week of foods MEDIUM-HIGH in Vitamin K (1 serving = 1 cup)  Asparagus (cooked, or boiled & drained) Broccoli (cooked, boiled & drained, or raw & chopped) Brussel sprouts (cooked, or boiled & drained) *serving size only =  cup Lettuce, raw (green leaf, endive, romaine) Spinach, raw Turnip greens, raw & chopped   These websites have more information on Coumadin (warfarin):  FailFactory.se; VeganReport.com.au;      Supplemental Discharge Instructions for  Pacemaker/Defibrillator Patients  Activity No heavy lifting or vigorous activity with your left/right arm for 6 to 8 weeks.  Do not raise your left/right arm above your head for one week.  Gradually raise your affected arm as drawn below.           __        04/16/15                    04/17/15                     04/18/15                   04/19/15  NO DRIVING for  6 months  WOUND CARE - Keep the wound area clean and dry.  Do not get this area wet for one week. No showers for one week; you may shower on   04/19/15 . - The tape/steri-strips on your wound will fall off; do not pull them off.  No bandage is needed on the site.  DO  NOT apply any creams, oils, or ointments to the wound area. - If you notice any drainage or discharge from the wound, any swelling or bruising at the site, or you develop a fever > 101? F after you are discharged home, call the office at once.  Special Instructions - You are still able to use  cellular telephones; use the ear opposite the side where you have your pacemaker/defibrillator.  Avoid carrying your cellular phone near your device. - When traveling through airports, show security personnel your identification card to avoid being screened in the metal detectors.  Ask the security personnel to use the hand wand. - Avoid arc welding equipment, MRI testing (magnetic resonance imaging), TENS units (transcutaneous nerve stimulators).  Call the office for questions about other devices. - Avoid electrical appliances that are in poor condition or are not properly grounded. - Microwave ovens are safe to be near or to operate.  Additional information for defibrillator patients should your device go off: - If your device goes off ONCE and you feel fine afterward, notify the device clinic nurses. - If your device goes off ONCE and you do not feel well afterward, call  911. - If your device goes off TWICE, call 911. - If your device goes off THREE times in one day, call 911.  DO NOT DRIVE YOURSELF OR A FAMILY MEMBER WITH A DEFIBRILLATOR TO THE HOSPITAL--CALL 911.

## 2015-04-09 NOTE — Progress Notes (Signed)
[facility] PROGRESS NOTE  Claire Mcdonald WJX:[id number] DOB: 28-Jun-1966 DOA: 03/25/2015 PCP: Golden Pop, MD  BRIEF NARRATIVE 49 year old obese female with history of hypertension, hyperlipidemia, anxiety, depression,was at work and suddenly collapsed. Patient received shock 2. She was intubated by EMS and transferred to [facility]. She was then transferred to [facility] for further evaluation. Patient extubated and cardiac catheterization done showed mild nonobstructive CAD with normal LV function. Echo showing EF of 55-60% with mild LVH and no wall motion abnormality. No prior history of syncope or seizures. Appears patient may have had status epilepticus. No family history of sudden cardiac death. Patient is a nonsmoker, does not drink or use illicit drug.   Assessment/Plan: Resuscitated VF arrest Possibly in the setting of acute seizures. Cardiac MRI with prior subendocardial infarct. Flexion I challenged by Dr. Caryl Comes and completed.  [place] [place] [address] for 6 months. - ICD placement for secondary prevention  is offered today but INR subtherapeutic been postponed until 8/17 -  Possible status epilepticus contributing to VF arrest Seen by neurology and started on Keppra. EEG unremarkable.no prior history of seizures.  DVT of right lower leg Patient on Lovenox and Coumadin. Plan on AICD once INR therapeutic. Now rescheduled for 8/17. CT angiogram of the chest negative for PE.  Dyslipidemia Continue statin  Hypertension Stable. Continue metoprolol  8 mm nonspecified groundglass density on right upper lung Seen on CT chest. Recommend follow-up chest CT in 6-12 months as patient is low risk.  DVT prophylaxis: Lovenox with Coumadin  Code Status:full code Family Communication: None at bedside Disposition Plan:home once ICD placed. (Possibly on 8/18)   Consultants:  Neurology  Cardiology  AP  Critical care  Procedures:  head CT  EEG  Cardiac cath  2-D echo  CT angiogram  of the chest  MRI with cardio morphology  Antibiotics:  none  HPI/Subjective: Patient seen and examined. Sleepy. No overnight issues  Objective: Filed Vitals:   04/09/15 0500  BP: 115/76  Pulse: 81  Temp: 98.2 F (36.8 C)  Resp:     Intake/Output Summary (Last 24 hours) at 04/09/15 1231 Last data filed at 04/09/15 0901  Gross per 24 hour  Intake    650 ml  Output      0 ml  Net    650 ml   Filed Weights   04/06/15 0449 04/07/15 0503 04/08/15 0606  Weight: 95.709 kg (211 lb) 99.338 kg (219 lb) 95.391 kg (210 lb 4.8 oz)    Exam:   General:   Not in  distress ,    HEENT:  moist oral mucosa    chest: Clear to auscultation bilaterally   CVS: Normal S1 and S2, no murmurs rub or gallop  GI: Soft, nondistended, nontender,  Musculoskeletal : Warm, no edema    Data Reviewed: Basic Metabolic Panel:  Recent Labs Lab 04/05/15 0437 04/06/15 0547 04/07/15 0544 04/08/15 0543 04/09/15 0325 04/09/15 0328  NA 139 140 138 138 140  --   K 4.1 3.6 3.8 3.9 4.3  --   CL 103 102 103 103 103  --   CO2 28 30 30 28 24   --   GLUCOSE 101* 114* 109* 107* 79  --   BUN 11 8 10 13 7   --   CREATININE 0.59 0.62 0.55 0.57 0.54  --   CALCIUM 8.7* 8.8* 8.6* 8.6* 8.9  --   MG 2.1 2.1 2.0 2.0  --  2.0  PHOS 4.6 4.1 4.2 4.0 4.4  --  Liver Function Tests:  Recent Labs Lab 04/03/15 0254  04/05/15 0437 04/06/15 0547 04/07/15 0544 04/08/15 0543 04/09/15 0325  AST 66*  --   --   --   --   --   --   ALT 63*  --   --   --   --   --   --   ALKPHOS 92  --   --   --   --   --   --   BILITOT 0.5  --   --   --   --   --   --   PROT 5.9*  --   --   --   --   --   --   ALBUMIN 2.8*  < > 2.8* 2.8* 2.9* 2.7* 3.0*  < > = values in this interval not displayed. No results for input(s): LIPASE, AMYLASE in the last 168 hours. No results for input(s): AMMONIA in the last 168 hours. CBC:  Recent Labs Lab 04/03/15 0254 04/04/15 0402  WBC 8.6 8.0  HGB 11.7* 11.5*  HCT 36.5 35.8*   MCV 94.1 95.2  PLT 344 376   Cardiac Enzymes: No results for input(s): CKTOTAL, CKMB, CKMBINDEX, TROPONINI in the last 168 hours. BNP (last 3 results)  Recent Labs  03/25/15 2140  BNP 146.1*    ProBNP (last 3 results) No results for input(s): PROBNP in the last 8760 hours.  CBG:  Recent Labs Lab 04/08/15 1112 04/08/15 1632 04/08/15 2123 04/09/15 0757 04/09/15 1143  GLUCAP 94 112* 90 88 119*    No results found for this or any previous visit (from the past 240 hour(s)).   Studies: No results found.  Scheduled Meds: . sodium chloride   Intravenous To Cath  . sodium chloride   Intravenous To Cath  . atorvastatin  80 mg Oral q1800  . carbamide peroxide  5 drop Both Ears BID  . [START ON 04/11/2015]  ceFAZolin (ANCEF) IV  2 g Intravenous To Cath  . cefTRIAXone (ROCEPHIN)  IV  1 g Intravenous Q24H  . enoxaparin (LOVENOX) injection  1.5 mg/kg Subcutaneous Q24H  . fluticasone  1 spray Each Nare Daily  . [START ON 04/11/2015] gentamicin irrigation  80 mg Irrigation To Cath  . levETIRAcetam  500 mg Oral BID  . magnesium oxide  400 mg Oral Daily  . metoprolol tartrate  12.5 mg Oral BID  . warfarin  10 mg Oral ONCE-1800  . warfarin   Does not apply Once  . Warfarin - Pharmacist Dosing Inpatient   Does not apply q1800   Continuous Infusions:       Time spent: [place], [place] [facility] Pager [phone]. If 7PM-7AM, please contact night-coverage at [email or web address], password [facility] 04/09/2015, 12:31 PM  LOS: 15 days

## 2015-04-10 DIAGNOSIS — N390 Urinary tract infection, site not specified: Secondary | ICD-10-CM

## 2015-04-10 LAB — CBC
HCT: 35.3 % — ABNORMAL LOW (ref 36.0–46.0)
HEMOGLOBIN: 11.3 g/dL — AB (ref 12.0–15.0)
MCH: 30.5 pg (ref 26.0–34.0)
MCHC: 32 g/dL (ref 30.0–36.0)
MCV: 95.4 fL (ref 78.0–100.0)
Platelets: 475 10*3/uL — ABNORMAL HIGH (ref 150–400)
RBC: 3.7 MIL/uL — ABNORMAL LOW (ref 3.87–5.11)
RDW: 14.5 % (ref 11.5–15.5)
WBC: 6.9 10*3/uL (ref 4.0–10.5)

## 2015-04-10 LAB — RENAL FUNCTION PANEL
Albumin: 2.8 g/dL — ABNORMAL LOW (ref 3.5–5.0)
Anion gap: 9 (ref 5–15)
BUN: 8 mg/dL (ref 6–20)
CO2: 29 mmol/L (ref 22–32)
Calcium: 8.4 mg/dL — ABNORMAL LOW (ref 8.9–10.3)
Chloride: 98 mmol/L — ABNORMAL LOW (ref 101–111)
Creatinine, Ser: 0.62 mg/dL (ref 0.44–1.00)
GFR calc non Af Amer: >60 mL/min (ref >60)
GLUCOSE: 93 mg/dL (ref 65–99)
POTASSIUM: 3.9 mmol/L (ref 3.5–5.1)
Phosphorus: 5 mg/dL — ABNORMAL HIGH (ref 2.5–4.6)
Sodium: 136 mmol/L (ref 135–145)

## 2015-04-10 LAB — PROTIME-INR
INR: 2.95 — ABNORMAL HIGH (ref 0.00–1.49)
Prothrombin Time: 30.2 seconds — ABNORMAL HIGH (ref 11.6–15.2)

## 2015-04-10 LAB — URINE CULTURE

## 2015-04-10 LAB — MAGNESIUM: Magnesium: 1.9 mg/dL (ref 1.7–2.4)

## 2015-04-10 MED ORDER — WARFARIN SODIUM 1 MG PO TABS
1.0000 mg | ORAL_TABLET | Freq: Once | ORAL | Status: AC
  Administered 2015-04-10: 1 mg via ORAL
  Filled 2015-04-10: qty 1

## 2015-04-10 MED ORDER — TOPIRAMATE 100 MG PO TABS
100.0000 mg | ORAL_TABLET | Freq: Two times a day (BID) | ORAL | Status: DC
  Administered 2015-04-10 – 2015-04-12 (×3): 100 mg via ORAL
  Filled 2015-04-10 (×5): qty 1

## 2015-04-10 NOTE — Progress Notes (Addendum)
ANTICOAGULATION CONSULT NOTE - Follow Up Consult  Pharmacy Consult for Lovenox and Coumadin Indication: DVT  Allergies  Allergen Reactions  . Toradol [Ketorolac Tromethamine] Itching and Rash  . Tramadol Itching and Rash  . Zofran [Ondansetron Hcl] Itching and Rash    Patient Measurements: Height: 5\' 1"  (154.9 cm) Weight: 213 lb 8 oz (96.843 kg) IBW/kg (Calculated) : 47.8  Vital Signs: Temp: 98.2 F (36.8 C) (08/16 0500) BP: 98/59 mmHg (08/16 0500) Pulse Rate: 87 (08/16 0500)  Labs:  Recent Labs  04/08/15 0543 04/09/15 0325 04/09/15 0328 04/10/15 0555  HGB  --   --   --  11.3*  HCT  --   --   --  35.3*  PLT  --   --   --  475*  LABPROT 20.8*  --  21.1* 30.2*  INR 1.79*  --  1.83* 2.95*  CREATININE 0.57 0.54  --  0.62    Estimated Creatinine Clearance: 92.5 mL/min (by C-G formula based on Cr of 0.62).  Assessment: 49 yo F with new RLE DVT.  Pt was initially on heparin pending ICD placement.  Decision has been made to start Coumadin with plans for ICD placement when INR >2 and off parenteral anticoagulant.  Today is day #8 of overlap for Coumadin initiation for VTE.  INR trending up today 1.8>2.95, no bleeding noted. Plan to continue lovenox for now for complete 24 hours overlap. Given significant rise in INR overnight will give very low dose tonight as EP needs an INR closer to 2.5 for ICD placement tomorrow.  Goal of Therapy:  INR 2-3 Anti-Xa level 0.6-1 units/ml 4hrs after LMWH dose given Monitor platelets by anticoagulation protocol: Yes   Plan:  Warfarin 1mg  tonight Continue Lovenox 145mg  [place] daily  Erin Hearing PharmD., BCPS Clinical Pharmacist Pager [phone] 04/10/2015 11:44 AM

## 2015-04-10 NOTE — Progress Notes (Addendum)
CARDIAC REHAB PHASE I   PRE:  Rate/Rhythm: 102 ST    BP: sitting 104/70    SaO2:   MODE:  Ambulation: 1100 ft   POST:  Rate/Rhythm: 113 ST    BP: sitting 124/82     SaO2:   Pt tolerated well with RW. No c/o. To recliner after walk. Set up ICD video for pt. She still needs to watch Coumadin video. [id number]   Josephina Shih [place] CES, ACSM 04/10/2015 11:49 AM

## 2015-04-10 NOTE — Progress Notes (Signed)
ICD implant planned for 8/17.  Will need INR closer to 2.5 for procedure.   Dr Caryl Comes to see in the morning.  Orders placed for ICD implant.   Chanetta Marshall, NP 04/10/2015 10:51 AM

## 2015-04-10 NOTE — Progress Notes (Signed)
[facility] PROGRESS NOTE  Claire Mcdonald [id number]:[id number] DOB: [birth year] DOA: 03/25/2015 PCP: Golden Pop, MD  BRIEF NARRATIVE 49 year old obese female with history of hypertension, hyperlipidemia, anxiety, depression,was at work and suddenly collapsed. Patient received shock 2. She was intubated by EMS and transferred to [facility]. She was then transferred to [facility] for further evaluation. Patient extubated and cardiac catheterization done showed mild nonobstructive CAD with normal LV function. Echo showing EF of 55-60% with mild LVH and no wall motion abnormality. No prior history of syncope or seizures. Appears patient may have had status epilepticus. No family history of sudden cardiac death. Patient is a nonsmoker, does not drink or use illicit drug.   Assessment/Plan: Resuscitated VF arrest Possibly in the setting of acute seizures. Cardiac MRI with prior subendocardial infarct. Flexion I challenged by Dr. Caryl Comes and completed.  . - ICD placement for secondary prevention  possibly tomorrow if INR stays close to 2.5. - Cannot drive for 6 months.  Possible status epilepticus contributing to VF arrest Seen by neurology and started on Keppra. EEG unremarkable.no prior history of seizures.  DVT of right lower leg Patient on Lovenox and Coumadin. Plan on AICD once INR therapeutic. Now rescheduled for 8/17. CT angiogram of the chest negative for PE. Needs Coumadin for 6 months.  UTI Patient having dysuria since yesterday. He reports for UTI and started on Rocephin. Follow culture.  Dyslipidemia Continue statin  Hypertension Stable. Continue metoprolol  8 mm nonspecified groundglass density on right upper lung Seen on CT chest. Recommend follow-up chest CT in 6-12 months as patient is low risk.  DVT prophylaxis: Lovenox with Coumadin  Code Status:full code Family Communication: None at bedside Disposition Plan:home once ICD placed. (Possibly on  8/18)   Consultants:  Neurology  Cardiology  AP  Critical care  Procedures:  head CT  EEG  Cardiac cath  2-D echo  CT angiogram of the chest  MRI with cardio morphology  Antibiotics:  IV Rocephin 8/15--  HPI/Subjective: Patient seen and examined. Sleepy. No overnight issues  Objective: Filed Vitals:   04/10/15 1326  BP: 117/70  Pulse: 76  Temp: 98.1 F (36.7 C)  Resp: 18    Intake/Output Summary (Last 24 hours) at 04/10/15 1430 Last data filed at 04/10/15 1229  Gross per 24 hour  Intake    360 ml  Output      0 ml  Net    360 ml   Filed Weights   04/07/15 0503 04/08/15 0606 04/09/15 2110  Weight: 99.338 kg (219 lb) 95.391 kg (210 lb 4.8 oz) 96.843 kg (213 lb 8 oz)    Exam:   General:   Middle aged obese female not in distress   HEENT:  moist oral mucosa    chest: Clear to auscultation bilaterally   CVS: Normal S1 and S2, no murmurs rub or gallop  GI: Soft, nondistended, nontender,  Musculoskeletal : Warm, no edema    Data Reviewed: Basic Metabolic Panel:  Recent Labs Lab 04/06/15 0547 04/07/15 0544 04/08/15 0543 04/09/15 0325 04/09/15 0328 04/10/15 0555  NA 140 138 138 140  --  136  K 3.6 3.8 3.9 4.3  --  3.9  CL 102 103 103 103  --  98*  CO2 30 30 28 24   --  29  GLUCOSE 114* 109* 107* 79  --  93  BUN 8 10 13 7   --  8  CREATININE 0.62 0.55 0.57 0.54  --  0.62  CALCIUM 8.8* 8.6*  8.6* 8.9  --  8.4*  MG 2.1 2.0 2.0  --  2.0 1.9  PHOS 4.1 4.2 4.0 4.4  --  5.0*   Liver Function Tests:  Recent Labs Lab 04/06/15 0547 04/07/15 0544 04/08/15 0543 04/09/15 0325 04/10/15 0555  ALBUMIN 2.8* 2.9* 2.7* 3.0* 2.8*   No results for input(s): LIPASE, AMYLASE in the last 168 hours. No results for input(s): AMMONIA in the last 168 hours. CBC:  Recent Labs Lab 04/04/15 0402 04/10/15 0555  WBC 8.0 6.9  HGB 11.5* 11.3*  HCT 35.8* 35.3*  MCV 95.2 95.4  PLT 376 475*   Cardiac Enzymes: No results for input(s): CKTOTAL,  CKMB, CKMBINDEX, TROPONINI in the last 168 hours. BNP (last 3 results)  Recent Labs  03/25/15 2140  BNP 146.1*    ProBNP (last 3 results) No results for input(s): PROBNP in the last 8760 hours.  CBG:  Recent Labs Lab 04/08/15 1632 04/08/15 2123 04/09/15 0757 04/09/15 1143 04/09/15 1630  GLUCAP 112* 90 88 119* 86    Recent Results (from the past 240 hour(s))  Culture, Urine     Status: None   Collection Time: 04/09/15  6:17 AM  Result Value Ref Range Status   Specimen Description URINE, CLEAN CATCH  Final   Special Requests NONE  Final   Culture MULTIPLE SPECIES PRESENT, SUGGEST RECOLLECTION  Final   Report Status 04/10/2015 FINAL  Final     Studies: No results found.  Scheduled Meds: . atorvastatin  80 mg Oral q1800  . carbamide peroxide  5 drop Both Ears BID  . [START ON 04/11/2015]  ceFAZolin (ANCEF) IV  2 g Intravenous To Cath  . cefTRIAXone (ROCEPHIN)  IV  1 g Intravenous Q24H  . enoxaparin (LOVENOX) injection  1.5 mg/kg Subcutaneous Q24H  . fluticasone  1 spray Each Nare Daily  . [START ON 04/11/2015] gentamicin irrigation  80 mg Irrigation To Cath  . levETIRAcetam  500 mg Oral BID  . magnesium oxide  400 mg Oral Daily  . metoprolol tartrate  12.5 mg Oral BID  . warfarin  1 mg Oral ONCE-1800  . Warfarin - Pharmacist Dosing Inpatient   Does not apply q1800   Continuous Infusions:       Time spent: [place], [place] [facility] Pager [phone]. If 7PM-7AM, please contact night-coverage at [email or web address], password [facility] 04/10/2015, 2:30 PM  LOS: 16 days

## 2015-04-10 NOTE — Progress Notes (Signed)
Pt complaining of low grade Headaches. Took Topamax at home but not continued here. Paged on-call with this information.

## 2015-04-10 NOTE — Progress Notes (Signed)
Walked Pt around nurses station circle twice and down to time clock and back. Tolerated well, complained of slight headache which received acetaminophen.

## 2015-04-11 ENCOUNTER — Encounter (HOSPITAL_COMMUNITY)
Admission: AD | Disposition: A | Discharge status: Home / Self Care | Payer: Medicaid Other | Source: Other Acute Inpatient Hospital | Attending: Internal Medicine

## 2015-04-11 ENCOUNTER — Encounter (HOSPITAL_COMMUNITY): Payer: Self-pay | Admitting: Internal Medicine

## 2015-04-11 ENCOUNTER — Inpatient Hospital Stay (HOSPITAL_COMMUNITY): Payer: Medicaid Other

## 2015-04-11 DIAGNOSIS — I4901 Ventricular fibrillation: Secondary | ICD-10-CM

## 2015-04-11 HISTORY — PX: EP IMPLANTABLE DEVICE: SHX172B

## 2015-04-11 LAB — RENAL FUNCTION PANEL
Albumin: 2.7 g/dL — ABNORMAL LOW (ref 3.5–5.0)
Anion gap: 6 (ref 5–15)
BUN: 12 mg/dL (ref 6–20)
CALCIUM: 8.6 mg/dL — AB (ref 8.9–10.3)
CHLORIDE: 103 mmol/L (ref 101–111)
CO2: 29 mmol/L (ref 22–32)
CREATININE: 0.59 mg/dL (ref 0.44–1.00)
GFR calc non Af Amer: >60 mL/min (ref >60)
GLUCOSE: 103 mg/dL — AB (ref 65–99)
Phosphorus: 4.6 mg/dL (ref 2.5–4.6)
Potassium: 4.5 mmol/L (ref 3.5–5.1)
SODIUM: 138 mmol/L (ref 135–145)

## 2015-04-11 LAB — PROTIME-INR
INR: 2.77 — AB (ref 0.00–1.49)
Prothrombin Time: 28.8 seconds — ABNORMAL HIGH (ref 11.6–15.2)

## 2015-04-11 LAB — MAGNESIUM: MAGNESIUM: 2 mg/dL (ref 1.7–2.4)

## 2015-04-11 SURGERY — ICD IMPLANT

## 2015-04-11 MED ORDER — WARFARIN SODIUM 2.5 MG PO TABS
2.5000 mg | ORAL_TABLET | Freq: Once | ORAL | Status: AC
  Administered 2015-04-11: 2.5 mg via ORAL
  Filled 2015-04-11: qty 1

## 2015-04-11 MED ORDER — LIDOCAINE HCL (PF) 1 % IJ SOLN
INTRAMUSCULAR | Status: DC | PRN
  Administered 2015-04-11: 31 mL

## 2015-04-11 MED ORDER — CEFAZOLIN SODIUM-DEXTROSE 2-3 GM-% IV SOLR
INTRAVENOUS | Status: AC
  Filled 2015-04-11: qty 50

## 2015-04-11 MED ORDER — FENTANYL CITRATE (PF) 100 MCG/2ML IJ SOLN
INTRAMUSCULAR | Status: AC
  Filled 2015-04-11: qty 4

## 2015-04-11 MED ORDER — MIDAZOLAM HCL 5 MG/5ML IJ SOLN
INTRAMUSCULAR | Status: AC
  Filled 2015-04-11: qty 25

## 2015-04-11 MED ORDER — CEFAZOLIN SODIUM 1-5 GM-% IV SOLN
INTRAVENOUS | Status: DC | PRN
  Administered 2015-04-11: 2 g via INTRAVENOUS

## 2015-04-11 MED ORDER — SODIUM CHLORIDE 0.9 % IV SOLN
INTRAVENOUS | Status: DC
Start: 2015-04-11 — End: 2015-04-12
  Administered 2015-04-11: 08:00:00 via INTRAVENOUS

## 2015-04-11 MED ORDER — FENTANYL CITRATE (PF) 100 MCG/2ML IJ SOLN
INTRAMUSCULAR | Status: DC | PRN
  Administered 2015-04-11 (×2): 25 ug via INTRAVENOUS
  Administered 2015-04-11: 12.5 ug via INTRAVENOUS

## 2015-04-11 MED ORDER — LIDOCAINE HCL (PF) 1 % IJ SOLN
INTRAMUSCULAR | Status: AC
  Filled 2015-04-11: qty 60

## 2015-04-11 MED ORDER — MIDAZOLAM HCL 5 MG/5ML IJ SOLN
INTRAMUSCULAR | Status: DC | PRN
  Administered 2015-04-11 (×2): 1 mg via INTRAVENOUS
  Administered 2015-04-11: 2 mg via INTRAVENOUS
  Administered 2015-04-11: 1 mg via INTRAVENOUS

## 2015-04-11 MED ORDER — FENTANYL CITRATE (PF) 100 MCG/2ML IJ SOLN
50.0000 ug | Freq: Once | INTRAMUSCULAR | Status: AC
  Administered 2015-04-11: 50 ug via INTRAVENOUS
  Filled 2015-04-11: qty 2

## 2015-04-11 MED ORDER — SODIUM CHLORIDE 0.9 % IR SOLN
Status: AC
  Filled 2015-04-11: qty 2

## 2015-04-11 SURGICAL SUPPLY — 7 items
CABLE SURGICAL S-101-97-12 (CABLE) ×2 IMPLANT
HEMOSTAT SURGICEL 2X4 FIBR (HEMOSTASIS) ×2 IMPLANT
ICD ELLIPSE VR CD1411-36C (ICD Generator) ×2 IMPLANT
LEAD DURATA 7122-60CM (Lead) ×2 IMPLANT
PAD DEFIB LIFELINK (PAD) ×2 IMPLANT
SHEATH CLASSIC 8F (SHEATH) ×2 IMPLANT
TRAY PACEMAKER INSERTION (CUSTOM PROCEDURE TRAY) ×2 IMPLANT

## 2015-04-11 NOTE — Progress Notes (Signed)
ANTICOAGULATION CONSULT NOTE - Follow Up Consult  Pharmacy Consult for Lovenox and Coumadin Indication: DVT  Allergies  Allergen Reactions  . Toradol [Ketorolac Tromethamine] Itching and Rash  . Tramadol Itching and Rash  . Zofran [Ondansetron Hcl] Itching and Rash    Patient Measurements: Height: 5\' 1"  (154.9 cm) Weight: 213 lb 14.4 oz (97.024 kg) IBW/kg (Calculated) : 47.8  Vital Signs: Temp: 97.5 F (36.4 C) (08/17 1014) Temp Source: Oral (08/17 0456) BP: 115/70 mmHg (08/17 1023) Pulse Rate: 71 (08/17 1021)  Labs:  Recent Labs  04/09/15 0325 04/09/15 0328 04/10/15 0555 04/11/15 0310  HGB  --   --  11.3*  --   HCT  --   --  35.3*  --   PLT  --   --  475*  --   LABPROT  --  21.1* 30.2* 28.8*  INR  --  1.83* 2.95* 2.77*  CREATININE 0.54  --  0.62 0.59    Estimated Creatinine Clearance: 92.6 mL/min (by C-G formula based on Cr of 0.59).  Assessment: 49 yo F with new RLE DVT.  Pt was initially on heparin pending ICD placement.  Decision has been made to start Coumadin with plans for ICD placement when INR >2 and off parenteral anticoagulant.  Today is day #8 of overlap for Coumadin initiation for VTE.  AC: Hep/Coum >> Enox/Coum for new RLE DVT.  CT neg PE. Day #8 overlap. INR 2.77   Hematology / Oncology: h&h 11.3/35.3, pLT 475  Goal of Therapy:  INR 2 - 3 Monitor platelets by anticoagulation protocol: Yes   Plan:  D/C enoxaparin Warfarin 2.5 mg x 1 Daily CBC and INR   Levester Fresh, PharmD, BCPS Clinical Pharmacist Pager [phone] 04/11/2015 1:52 PM

## 2015-04-11 NOTE — Progress Notes (Signed)
Initial Nutrition Assessment  DOCUMENTATION CODES:   Morbid obesity  INTERVENTION:   Continue heart healthy diet to provide nutrition needs, current intake is adequate.  NUTRITION DIAGNOSIS:   No nutrition diagnosis at this time.  GOAL:   Patient will meet greater than or equal to 90% of their needs  Met  MONITOR:   PO intake, Labs, Weight trends, Skin, I & O's   ASSESSMENT:   Claire Mcdonald is 49 y/o woman with past medical history of hypertension, chronic pain and depression who is on gabapentin and prn topomax. She was working at [employer] this evening when her coworkers called EMS to report seizure like activity. When EMS arrived she was placed on an AED and shock was advised. She was shocked twice and a King airway placed. She was noted to again have seizure like activity in the ambulance and in the ED at [facility]. She was treated with ativan and keppra. She was transferred to [facility] for cooling after out of hospital arrest.  S/P ICD placement this AM. Diet being advanced to heart healthy today. RN reports that patient is eating well; per documentation, she is consuming 75-100% of meals. No nutrition needs at this time.   Diet Order:  Diet NPO time specified  Skin:  Reviewed, no issues  Last BM:  8/16  Height:   Ht Readings from Last 1 Encounters:  04/09/15 5' 1" (1.549 m)    Weight:   Wt Readings from Last 1 Encounters:  04/11/15 213 lb 14.4 oz (97.024 kg)    Ideal Body Weight:  52.3 kg  BMI:  Body mass index is 40.44 kg/(m^2).  Estimated Nutritional Needs:   Kcal:  4034-7425  Protein:  95-105 grams  Fluid:  1.9-2.1 L  EDUCATION NEEDS:   Education needs addressed  Molli Barrows, [place], [place], [place] Pager [phone] After Hours Pager [phone]

## 2015-04-11 NOTE — Progress Notes (Addendum)
[facility] PROGRESS NOTE  Claire Mcdonald XBD:[id number] DOB: 1966/08/25 DOA: 03/25/2015 PCP: Golden Pop, MD  BRIEF NARRATIVE 49 year old obese female with history of hypertension, hyperlipidemia, anxiety, depression,was at work and suddenly collapsed. Patient received shock 2. She was intubated by EMS and transferred to [facility]. She was then transferred to [facility] for further evaluation. Patient extubated and cardiac catheterization done showed mild nonobstructive CAD with normal LV function. Echo showing EF of 55-60% with mild LVH and no wall motion abnormality. No prior history of syncope or seizures. Appears patient may have had status epilepticus. No family history of sudden cardiac death. Patient is a nonsmoker, does not drink or use illicit drug.   Assessment/Plan: Resuscitated VF arrest -Sustained Ventricular Tachycardia - Polymorphic. Possibly in the setting of acute seizures. Cardiac MRI with prior subendocardial infarct. Flecanide  challenged by Dr. Caryl Comes and completed.  . - ICD placement for secondary prevention  8/17 INR  Therapeutic  today - Cannot drive for 6 months.    Headache Check CT head to r/o bleed as patient on coumadin   Possible status epilepticus contributing to VF arrest Seen by neurology and started on Keppra. EEG unremarkable.no prior history of seizures.  DVT of right lower leg , no evidence of PE Patient on Lovenox and Coumadin. Plan on AICD once INR therapeutic. Now rescheduled for 8/17. CT angiogram of the chest negative for PE. Needs Coumadin for 6 months.  UTI Patient having dysuria since yesterday. He reports for UTI and started on Rocephin. Follow culture.  Dyslipidemia Continue statin  Hypertension Stable. Continue metoprolol  8 mm nonspecified groundglass density on right upper lung Seen on CT chest. Recommend follow-up chest CT in 6-12 months as patient is low risk.  DVT prophylaxis: Lovenox with Coumadin  Code Status:full  code Family Communication: None at bedside Disposition Plan:home once ICD placed. (Possibly on 8/18)   Consultants:  Neurology  Cardiology  AP  Critical care  Procedures:  head CT  EEG  Cardiac cath  2-D echo  CT angiogram of the chest  MRI with cardio morphology  Antibiotics:  IV Rocephin 8/15--  HPI/Subjective:  patient complain of some headache this morning ,  Objective: Filed Vitals:   04/11/15 1023  BP: 115/70  Pulse:   Temp:   Resp:     Intake/Output Summary (Last 24 hours) at 04/11/15 1329 Last data filed at 04/10/15 1728  Gross per 24 hour  Intake    200 ml  Output      0 ml  Net    200 ml   Filed Weights   04/08/15 0606 04/09/15 2110 04/11/15 0456  Weight: 95.391 kg (210 lb 4.8 oz) 96.843 kg (213 lb 8 oz) 97.024 kg (213 lb 14.4 oz)    Exam:   General:   Middle aged obese female not in distress   HEENT:  moist oral mucosa    chest: Clear to auscultation bilaterally   CVS: Normal S1 and S2, no murmurs rub or gallop  GI: Soft, nondistended, nontender,  Musculoskeletal : Warm, no edema    Data Reviewed: Basic Metabolic Panel:  Recent Labs Lab 04/07/15 0544 04/08/15 0543 04/09/15 0325 04/09/15 0328 04/10/15 0555 04/11/15 0310  NA 138 138 140  --  136 138  K 3.8 3.9 4.3  --  3.9 4.5  CL 103 103 103  --  98* 103  CO2 30 28 24   --  29 29  GLUCOSE 109* 107* 79  --  93 103*  BUN 10 13 7   --  8 12  CREATININE 0.55 0.57 0.54  --  0.62 0.59  CALCIUM 8.6* 8.6* 8.9  --  8.4* 8.6*  MG 2.0 2.0  --  2.0 1.9 2.0  PHOS 4.2 4.0 4.4  --  5.0* 4.6   Liver Function Tests:  Recent Labs Lab 04/07/15 0544 04/08/15 0543 04/09/15 0325 04/10/15 0555 04/11/15 0310  ALBUMIN 2.9* 2.7* 3.0* 2.8* 2.7*   No results for input(s): LIPASE, AMYLASE in the last 168 hours. No results for input(s): AMMONIA in the last 168 hours. CBC:  Recent Labs Lab 04/10/15 0555  WBC 6.9  HGB 11.3*  HCT 35.3*  MCV 95.4  PLT 475*   Cardiac  Enzymes: No results for input(s): CKTOTAL, CKMB, CKMBINDEX, TROPONINI in the last 168 hours. BNP (last 3 results)  Recent Labs  03/25/15 2140  BNP 146.1*    ProBNP (last 3 results) No results for input(s): PROBNP in the last 8760 hours.  CBG:  Recent Labs Lab 04/08/15 1632 04/08/15 2123 04/09/15 0757 04/09/15 1143 04/09/15 1630  GLUCAP 112* 90 88 119* 86    Recent Results (from the past 240 hour(s))  Culture, Urine     Status: None   Collection Time: 04/09/15  6:17 AM  Result Value Ref Range Status   Specimen Description URINE, CLEAN CATCH  Final   Special Requests NONE  Final   Culture MULTIPLE SPECIES PRESENT, SUGGEST RECOLLECTION  Final   Report Status 04/10/2015 FINAL  Final     Studies: No results found.  Scheduled Meds: . atorvastatin  80 mg Oral q1800  . carbamide peroxide  5 drop Both Ears BID  . cefTRIAXone (ROCEPHIN)  IV  1 g Intravenous Q24H  . enoxaparin (LOVENOX) injection  1.5 mg/kg Subcutaneous Q24H  . fluticasone  1 spray Each Nare Daily  . levETIRAcetam  500 mg Oral BID  . magnesium oxide  400 mg Oral Daily  . metoprolol tartrate  12.5 mg Oral BID  . topiramate  100 mg Oral BID  . Warfarin - Pharmacist Dosing Inpatient   Does not apply q1800   Continuous Infusions: . sodium chloride 50 mL/hr at 04/11/15 0818       Time spent: [facility] Pager [phone]. If 7PM-7AM, please contact night-coverage at [email or web address], password [facility] 04/11/2015, 1:29 PM  LOS: 17 days

## 2015-04-11 NOTE — Progress Notes (Signed)
Pt walked another 2 laps around Nurses station circle and down to time clock east of unit. No complaints, headache better with first topamax pill given

## 2015-04-11 NOTE — H&P (View-Only) (Signed)
Subjective:  No SOB.   Objective:  Vital Signs in the last 24 hours: Temp:  [98.2 F (36.8 C)-98.4 F (36.9 C)] 98.2 F (36.8 C) (08/15 0500) Pulse Rate:  [79-91] 81 (08/15 0500) Resp:  [14-16] 16 (08/14 2109) BP: (107-115)/(61-81) 115/76 mmHg (08/15 0500) SpO2:  [95 %-97 %] 95 % (08/15 0500)  Intake/Output from previous day:  Intake/Output Summary (Last 24 hours) at 04/09/15 0815 Last data filed at 04/09/15 0500  Gross per 24 hour  Intake    410 ml  Output      0 ml  Net    410 ml    Physical Exam: General appearance: alert, cooperative, no distress and morbidly obese Lungs: scattered expiratory wheezes Heart: regular rate and rhythm Skin: pale, cool, dry Neurologic: Grossly normal   Rate: 80-140  Rhythm: normal sinus rhythm, sinus tachycardia and premature ventricular contractions (PVC)  Lab Results: No results for input(s): WBC, HGB, PLT in the last 72 hours.  Recent Labs  04/08/15 0543 04/09/15 0325  NA 138 140  K 3.9 4.3  CL 103 103  CO2 28 24  GLUCOSE 107* 79  BUN 13 7  CREATININE 0.57 0.54   No results for input(s): TROPONINI in the last 72 hours.  Invalid input(s): CK, MB  Recent Labs  04/09/15 0328  INR 1.83*    Scheduled Meds: . sodium chloride   Intravenous To Cath  . sodium chloride   Intravenous To Cath  . atorvastatin  80 mg Oral q1800  . carbamide peroxide  5 drop Both Ears BID  .  ceFAZolin (ANCEF) IV  2 g Intravenous To Cath  . cefTRIAXone (ROCEPHIN)  IV  1 g Intravenous Q24H  . enoxaparin (LOVENOX) injection  1.5 mg/kg Subcutaneous Q24H  . fluticasone  1 spray Each Nare Daily  . gentamicin irrigation  80 mg Irrigation To Cath  . levETIRAcetam  500 mg Oral BID  . magnesium oxide  400 mg Oral Daily  . metoprolol tartrate  12.5 mg Oral BID  . warfarin   Does not apply Once  . Warfarin - Pharmacist Dosing Inpatient   Does not apply q1800   Continuous Infusions:  PRN Meds:.sodium chloride, acetaminophen (TYLENOL) oral  liquid 160 mg/5 mL, albuterol, HYDROcodone-acetaminophen, HYDROcodone-homatropine, ondansetron (ZOFRAN) IV, phenol, promethazine, sodium chloride, zolpidem   Imaging: Imaging results have been reviewed  Cardiac Studies: MRI 03/30/15 IMPRESSION: 1. Normal left ventricular size, thickness and systolic function (LVEF = 65%) with hypokinesis of the apical inferior wall.  2. Normal right ventricular size, thickness and systolic function (LVEF = 63%) with no regional wall motion abnormalities.  3. Mild left atrial dilatation.  4. Mild mitral and mild tricuspid regurgitation.  5. A subendocardial late gadolinium enhancement in the apical inferior wall is consistent with prior subendocardial infarct.  6. There is no evidence for ARVC.   Assessment/Plan:  49 y/o female patient had a resuscitated arrest at work 03/25/15. Cardiac catheterization showed non obstructive CAD and normal LV function. There is no family history of sudden death or syncope. Cardiac MRI suggests subendocardial infarct, (Takotsubo event ?). Plan was for ICD on 04/02/15 but she became SOB. CTA and LE dopplers done. She is positive for DVT but negative for PE (incidental pulmonary nodule seen). Coumadin initiated with Heparin crossover. Plan is for ICD today-on Coumadin.   Principal Problem:   Cardiac arrest Active Problems:   Troponin level elevated- ? Takotsubo event   Essential hypertension   DVT (deep venous thrombosis)-  RLE   Normal coronary arteries   Chronic anticoagulation- started this admission   Hyperlipemia   Obesity-BMI 40   Pulmonary nodule seen on imaging study   PLAN: ICD Wednesday. No driving for 6 months. F/U CT for pulmonary nodule in 6 months. Not sure about seizure diagnosis but Neurology recommended Keppra at discharge and Neurology f/u 2-4 weeks after discharge. Antibiotics per internal medicine (possible UTI by UA-culture pending).   Kerin Ransom PA-C 04/09/2015, 8:15  AM [phone]  Pt s inR still subtheraapeutic so will plan ICD on Wednesday Reviewed with pt Needs to ambulate

## 2015-04-11 NOTE — Interval H&P Note (Signed)
ICD Criteria  Current LVEF:55%. Within 12 months prior to implant: Yes   Heart failure history: Yes, Class I  Cardiomyopathy history: No.  Atrial Fibrillation/Atrial Flutter: No.  Ventricular tachycardia history: Yes, Hemodynamic instability present. VT Type: Sustained Ventricular Tachycardia - Polymorphic.  Cardiac arrest history: Yes, Ventricular Fibrillation.  History of syndromes with risk of sudden death: No.  Previous ICD: No.  Current ICD indication: Secondary  PPM indication: No.  Beta Blocker therapy for 3 or more months: Yes, prescribed.   Ace Inhibitor/ARB therapy for 3 or more months: No, medical reason.  History and Physical Interval Note:  04/11/2015 7:46 AM  Claire Mcdonald  has presented today for surgery, with the diagnosis of cardiac arrest  The various methods of treatment have been discussed with the patient and family. After consideration of risks, benefits and other options for treatment, the patient has consented to  Procedure(s): ICD Implant (N/A) as a surgical intervention .  The patient's history has been reviewed, patient examined, no change in status, stable for surgery.  I have reviewed the patient's chart and labs.  Questions were answered to the patient's satisfaction.     Virl Axe

## 2015-04-12 ENCOUNTER — Telehealth: Payer: Self-pay | Admitting: Physician Assistant

## 2015-04-12 DIAGNOSIS — R7989 Other specified abnormal findings of blood chemistry: Secondary | ICD-10-CM

## 2015-04-12 DIAGNOSIS — R911 Solitary pulmonary nodule: Secondary | ICD-10-CM

## 2015-04-12 DIAGNOSIS — I82401 Acute embolism and thrombosis of unspecified deep veins of right lower extremity: Secondary | ICD-10-CM

## 2015-04-12 LAB — RENAL FUNCTION PANEL
Albumin: 2.7 g/dL — ABNORMAL LOW (ref 3.5–5.0)
Anion gap: 6 (ref 5–15)
BUN: 10 mg/dL (ref 6–20)
CHLORIDE: 107 mmol/L (ref 101–111)
CO2: 27 mmol/L (ref 22–32)
Calcium: 8.5 mg/dL — ABNORMAL LOW (ref 8.9–10.3)
Creatinine, Ser: 0.61 mg/dL (ref 0.44–1.00)
GFR calc Af Amer: >60 mL/min (ref >60)
Glucose, Bld: 102 mg/dL — ABNORMAL HIGH (ref 65–99)
Phosphorus: 4.4 mg/dL (ref 2.5–4.6)
Potassium: 4.6 mmol/L (ref 3.5–5.1)
Sodium: 140 mmol/L (ref 135–145)

## 2015-04-12 LAB — PROTIME-INR
INR: 2.03 — ABNORMAL HIGH (ref 0.00–1.49)
Prothrombin Time: 22.8 seconds — ABNORMAL HIGH (ref 11.6–15.2)

## 2015-04-12 LAB — MAGNESIUM: Magnesium: 2.1 mg/dL (ref 1.7–2.4)

## 2015-04-12 MED ORDER — ATORVASTATIN CALCIUM 20 MG PO TABS: 20.0000 mg | ORAL_TABLET | Freq: Every day | ORAL | Status: DC

## 2015-04-12 MED ORDER — LEVETIRACETAM 500 MG PO TABS: 500.0000 mg | ORAL_TABLET | Freq: Two times a day (BID) | ORAL | Status: DC

## 2015-04-12 MED ORDER — WARFARIN SODIUM 5 MG PO TABS: 5.0000 mg | ORAL_TABLET | Freq: Once | ORAL | Status: DC

## 2015-04-12 MED ORDER — HYDROCODONE-ACETAMINOPHEN 5-325 MG PO TABS: 2.0000 | ORAL_TABLET | ORAL | Status: DC | PRN

## 2015-04-12 MED ORDER — OFF THE BEAT BOOK
Freq: Once | Status: AC
  Administered 2015-04-12: 11:00:00
  Filled 2015-04-12: qty 1

## 2015-04-12 MED ORDER — TOPIRAMATE 100 MG PO TABS: 100.0000 mg | ORAL_TABLET | Freq: Two times a day (BID) | ORAL | Status: AC

## 2015-04-12 MED ORDER — METOPROLOL TARTRATE 25 MG PO TABS: 12.5000 mg | ORAL_TABLET | Freq: Two times a day (BID) | ORAL | Status: DC

## 2015-04-12 MED ORDER — WARFARIN SODIUM 5 MG PO TABS: 5.0000 mg | ORAL_TABLET | Freq: Every day | ORAL | Status: DC

## 2015-04-12 MED FILL — Sodium Chloride Irrigation Soln 0.9%: Qty: 500 | Status: AC

## 2015-04-12 MED FILL — Cefazolin Sodium for IV Soln 2 GM and Dextrose 3% (50 ML): INTRAVENOUS | Qty: 50 | Status: AC

## 2015-04-12 MED FILL — Gentamicin Sulfate Inj 40 MG/ML: INTRAMUSCULAR | Qty: 2 | Status: AC

## 2015-04-12 NOTE — Progress Notes (Signed)
PT Cancellation Note  Patient Details Name: Claire Mcdonald MRN: [id number] DOB: 12-21-1965  (late entry) Cancelled Treatment:    Reason Eval/Treat Not Completed: Other (comment).  Pt had ICD placed earlier today and is still very painful and lethargic.  She would like to wait until tomorrow to try to walk with PT.  I spoke with her about her therapy options at discharge.  She has been walking >1,000 feet with cardiac rehab in recent sessions and would be better served by out patient physical therapy at this time.  Pt is agreeable to this and understands that she will need to get a ride as the MDs are recommending that she doesn't drive for 6 months.  PT will continue to follow acutely until d/c. Thanks,    Barbarann Ehlers. Moon Budde, PT, DPT [phone]   04/12/2015, 12:30 PM

## 2015-04-12 NOTE — Progress Notes (Addendum)
CARDIAC REHAB PHASE I   PRE:  Rate/Rhythm: 88 sR  BP:  Sitting: 18/62        SaO2: 96 RA  MODE:  Ambulation: 400 ft   POST:  Rate/Rhythm: 113 ST  BP:  Sitting: 113/83         SaO2: 95 RA  Pt lying in bed, c/o mild incisional pain. Pt ambulated 400 ft on RA, handheld assist, gait belt, slow, steady gait, tolerated well.  Pt denies DOE, cp, dizziness, c/o of feeling "mild palpitations," (HR 110s-120s, ST), declined rest stop. Completed MI/cardiac arrest/ICD education. Reviewed coumadin, MI book (not states questionable Takotsubo's-diagnosis somewhat unclear) and off the beat book, activity restrictions, exercise, heart healthy diet, portion control, and phase 2 cardiac rehab. Pt verbalized understanding. Pt agrees to phase 2 cardiac rehab if qualifies as an MI. Will send referral to [facility] in case pt does qualify. Pt does have concern for transportation to MD appointments, coumadin checks, outpatient PT and cardiac rehab, pt cannot drive for 6 months and does not have a car or someone to provide transportation. Pt has utilized medicaid transportation in the past. Discussed with case manager who is assisting pt. It remains unclear whether or not pt will be able to participate in outpatient PT and cardiac rehab with medicaid coverage or if she will qualify for medicaid transportation. Pt understands she may not be able to participate in phase 2 cardiac rehab. Pt to recliner after walk, call bell within reach.   [phone]  Lenna Sciara, RN, BSN 04/12/2015 11:18 AM

## 2015-04-12 NOTE — Progress Notes (Signed)
CSW Armed forces technical officer) spoke with pt RE: Transportation Concerns per Medical Team. Pt reports no issues with transportation home today. Her daughter will be assisting with this. Pt informed CSW she has used Medicaid transportation to get to monthly appointments. Pt unsure if she can use Medicaid transportation for other needs. CSW informed pt that she can use Medicaid transportation for other medical appointments as well. Pt aware she needs to schedule in advance. Pt provided with transportation handout with phone numbers. Pt has no further hospital social work needs.CSW signing off.  [place], [place]

## 2015-04-12 NOTE — Progress Notes (Signed)
Physical Therapy Treatment Patient Details Name: Claire Mcdonald MRN: [id number] DOB: 09-30-1965 Today's Date: 04/12/2015    History of Present Illness Ms. Schoch is 49 y/o woman with past medical history of hypertension, chronic pain and depression who is on gabapentin and prn topomax. She was working on evening of admit when her coworkers called EMS to report seizure like activity.She was shocked twice and a King airway placed. She was noted to again have seizure like activity in the ambulance and in the ED at [facility]. She was extubated on 8/4.  (+) LE DVT.  Started on IV heparin. S/p ICD on 04/11/15.    PT Comments    Pt is preparing for d/c home.  RN case manager asked PT to give pt HEP in case the outpatient therapy falls through (due to transportation or other).  HEP handout given and reviewed as well as walking instructions.  PT will continue to follow acutely until d/c.  Follow Up Recommendations  Outpatient PT;Supervision - Intermittent     Equipment Recommendations  Rolling walker with 5" wheels    Recommendations for Other Services       Precautions / Restrictions Precautions Precautions: ICD/Pacemaker    Mobility  Bed Mobility                  Transfers                    Ambulation/Gait                 Stairs            Wheelchair Mobility    Modified Rankin (Stroke Patients Only)       Balance                                    Cognition Arousal/Alertness: Awake/alert Behavior During Therapy: WFL for tasks assessed/performed Overall Cognitive Status: Within Functional Limits for tasks assessed                      Exercises General Exercises - Lower Extremity Long Arc Quad: AROM;Both;10 reps;Seated Hip ABduction/ADduction: AROM;Both;10 reps;Seated Hip Flexion/Marching: AROM;Both;10 reps;Seated Toe Raises: AROM;Both;10 reps;Seated Heel Raises: AROM;Both;10 reps;Seated Other Exercises Other  Exercises: HEP of above listed exercises given to pt with instructions on walking 3 times per day inside for 3-5 mins.      General Comments        Pertinent Vitals/Pain Pain Assessment: Faces Faces Pain Scale: Hurts little more Pain Location: at ICD incision Pain Descriptors / Indicators: Aching;Constant Pain Intervention(s): Limited activity within patient's tolerance;Monitored during session;Repositioned    Home Living                      Prior Function            PT Goals (current goals can now be found in the care plan section) Acute Rehab PT Goals Patient Stated Goal: to return home and get stronger Progress towards PT goals: Progressing toward goals    Frequency  Min 3X/week    PT Plan Current plan remains appropriate    Co-evaluation             End of Session   Activity Tolerance: Patient tolerated treatment well Patient left: in chair;with call bell/phone within reach;with family/visitor present     Time: 1200-1215 PT Time Calculation (min) (ACUTE  ONLY): 15 min  Charges:  $Therapeutic Exercise: 8-22 mins                    G Codes:      [place] 04/12/2015, 3:19 PM

## 2015-04-12 NOTE — Progress Notes (Signed)
ANTICOAGULATION CONSULT NOTE - Follow Up Consult  Pharmacy Consult for Lovenox and Coumadin Indication: DVT  Allergies  Allergen Reactions  . Toradol [Ketorolac Tromethamine] Itching and Rash  . Tramadol Itching and Rash  . Zofran [Ondansetron Hcl] Itching and Rash    Patient Measurements: Height: 5\' 1"  (154.9 cm) Weight: 217 lb 11.2 oz (98.748 kg) IBW/kg (Calculated) : 47.8  Vital Signs: Temp: 98.8 F (37.1 C) (08/18 0430) Temp Source: Oral (08/18 0430) BP: 123/81 mmHg (08/18 0804) Pulse Rate: 87 (08/18 0804)  Labs:  Recent Labs  04/10/15 0555 04/11/15 0310 04/12/15 0525  HGB 11.3*  --   --   HCT 35.3*  --   --   PLT 475*  --   --   LABPROT 30.2* 28.8* 22.8*  INR 2.95* 2.77* 2.03*  CREATININE 0.62 0.59 0.61    Estimated Creatinine Clearance: 93.6 mL/min (by C-G formula based on Cr of 0.61).  Assessment: 49 yo F with new RLE DVT.  Pt was initially on heparin pending ICD placement.  Decision has been made to start Coumadin with plans for ICD placement when INR >2 and off parenteral anticoagulant.  Today is day #8 of overlap for Coumadin initiation for VTE.  AC: Hep/Coum >> Enox/Coum >> warfarin for new RLE DVT. INR 2.03   Hematology / Oncology: h&h 11.3/35.3, pLT 475 (8/16)  Goal of Therapy:  INR 2 - 3 Monitor platelets by anticoagulation protocol: Yes   Plan:  Warfarin 5 mg x 1 Daily CBC and INR   Levester Fresh, PharmD, BCPS Clinical Pharmacist Pager [phone] 04/12/2015 8:11 AM

## 2015-04-12 NOTE — Discharge Summary (Addendum)
Physician Discharge Summary  Claire Mcdonald ZOX:[id number] DOB: May 15, 1966 DOA: 03/25/2015  PCP: Golden Pop, MD  Admit date: 03/25/2015 Discharge date: 04/12/2015  Time spent: >35 minutes  Recommendations for Outpatient Follow-up:  PCP in 3 days. Monday to check INR, adjust coumadin  EP-Cardiology on 8/31  Discharge Diagnoses:  Principal Problem:   Cardiac arrest Active Problems:   Essential hypertension   Hyperlipemia   DVT (deep venous thrombosis)   Troponin level elevated   Normal coronary arteries   Obesity-BMI 40   Pulmonary nodule seen on imaging study   Chronic anticoagulation   Discharge Condition: stable   Diet recommendation: low sodium   Filed Weights   04/11/15 0456 04/11/15 2330 04/12/15 0430  Weight: 97.024 kg (213 lb 14.4 oz) 97.977 kg (216 lb) 98.748 kg (217 lb 11.2 oz)    History of present illness:  49 y/o obese female with history of hypertension, hyperlipidemia, anxiety, depression,was at work and suddenly collapsed. Patient received shock 2. She was intubated by EMS and transferred to [facility]. She was then transferred to [facility] for further evaluation. Patient extubated and cardiac catheterization done showed mild nonobstructive CAD with normal LV function. Echo showing EF of 55-60% with mild LVH and no wall motion abnormality. No prior history of syncope or seizures. Appears patient may have had status epilepticus. No family history of sudden cardiac death.   Hospital Course:  1. Resuscitated VF arrest -Sustained Ventricular Tachycardia - Polymorphic. Possibly in the setting of acute seizures. Cardiac MRI with prior subendocardial infarct. Flecanide challenged by Dr. Caryl Comes and completed. . - ICD placement for secondary prevention 8/17. Patient is arranged for outpatient follow up with EP cardiology. Cannot drive for 6 months.   2. Possible status epilepticus contributing to VF arrest Seen by neurology and started on Keppra. EEG unremarkable.no prior  history of seizures.  3. DVT of right lower leg. No evidence of PE -Patient was on Lovenox transitioned to Coumadin. CT angiogram of the chest negative for PE. Needs Coumadin for 6 months. D/w patient, she preferred to follo up with PCP on Monday to recheck INR, adjust coumadin as needed  4. UTI. Patient completed Rocephin. 5. Hypertension Stable. Continue metoprolol 6. 8 mm nonspecified groundglass density on right upper lung Seen on CT chest. Recommend follow-up chest CT in 6-12 months as patient is low risk.   Procedures:  ICD (i.e. Studies not automatically included, echos, thoracentesis, etc; not x-rays)  Consultations:  EP cardiology, neurology   Discharge Exam: Filed Vitals:   04/12/15 0804  BP: 123/81  Pulse: 87  Temp:   Resp: 12    General: alert. No distress  Cardiovascular: s1,s2 rrr Respiratory: CTA BL  Discharge Instructions  Discharge Instructions    Diet - low sodium heart healthy    Complete by:  As directed      Discharge instructions    Complete by:  As directed   Please follow up with primary care doctor on 3 days to check INR, and adjust coumadin dose Please follow up with cardiology clinic on 8/31     Increase activity slowly    Complete by:  As directed             Medication List    STOP taking these medications        amoxicillin 875 MG tablet  Commonly known as:  AMOXIL     furosemide 20 MG tablet  Commonly known as:  LASIX     gabapentin 300 MG capsule  Commonly known as:  NEURONTIN     metoCLOPramide 5 MG tablet  Commonly known as:  REGLAN     metoprolol succinate 100 MG 24 hr tablet  Commonly known as:  TOPROL-XL     naproxen 500 MG tablet  Commonly known as:  NAPROSYN      TAKE these medications        albuterol 108 (90 BASE) MCG/ACT inhaler  Commonly known as:  PROVENTIL HFA;VENTOLIN HFA  Inhale 2 puffs into the lungs every 6 (six) hours as needed for wheezing or shortness of breath.     atorvastatin 20 MG tablet   Commonly known as:  LIPITOR  Take 1 tablet (20 mg total) by mouth daily.     DULoxetine 60 MG capsule  Commonly known as:  CYMBALTA  Take 60 mg by mouth daily.     fluticasone 50 MCG/ACT nasal spray  Commonly known as:  FLONASE  Place 1 spray into both nostrils daily.     HYDROcodone-acetaminophen 5-325 MG per tablet  Commonly known as:  NORCO/VICODIN  Take 2 tablets by mouth every 4 (four) hours as needed.     levETIRAcetam 500 MG tablet  Commonly known as:  KEPPRA  Take 1 tablet (500 mg total) by mouth 2 (two) times daily.     metoprolol tartrate 25 MG tablet  Commonly known as:  LOPRESSOR  Take 0.5 tablets (12.5 mg total) by mouth 2 (two) times daily.     omeprazole 20 MG capsule  Commonly known as:  PRILOSEC  Take 20 mg by mouth daily.     promethazine 25 MG tablet  Commonly known as:  PHENERGAN  Take 1 tablet (25 mg total) by mouth every 6 (six) hours as needed for nausea.     topiramate 100 MG tablet  Commonly known as:  TOPAMAX  Take 1 tablet (100 mg total) by mouth 2 (two) times daily.     warfarin 5 MG tablet  Commonly known as:  COUMADIN  Take 1 tablet (5 mg total) by mouth daily.       Allergies  Allergen Reactions  . Toradol [Ketorolac Tromethamine] Itching and Rash  . Tramadol Itching and Rash  . Zofran [Ondansetron Hcl] Itching and Rash       Follow-up Information    Follow up with Golden Pop, MD. Schedule an appointment as soon as possible for a visit in 3 days.   Specialty:  Family Medicine   Contact information:   [place] [address] [phone]       Follow up with [facility] On 04/25/2015.   Why:  at [facility] for wound check   Contact information:   [address] [place] [phone]       Follow up with Virl Axe, MD On 07/16/2015.   Specialty:  Cardiology   Why:  at [place] information:   [place] [address] [place] [address] [phone]         The results of significant diagnostics from this hospitalization (including imaging, microbiology, ancillary and laboratory) are listed below for reference.    Significant Diagnostic Studies: Dg Abd 1 View  03/25/2015   CLINICAL DATA:  OG tube placement, cardiac arrest  EXAM: ABDOMEN - 1 VIEW  COMPARISON:  CT abdomen pelvis dated 12/30/2014  FINDINGS: Enteric tube is looped in the stomach and terminates in the gastric cardia.  Nonobstructive bowel gas pattern.  Defibrillator pads  overlie the lower chest/upper abdomen.  IMPRESSION: Enteric tube is looped in the stomach and terminates in the gastric cardia.   Electronically Signed   By: Julian Hy M.D.   On: 03/25/2015 16:10   Ct Head Wo Contrast  04/11/2015   CLINICAL DATA:  Headaches.  EXAM: CT HEAD WITHOUT CONTRAST  TECHNIQUE: Contiguous axial images were obtained from the base of the skull through the vertex without intravenous contrast.  COMPARISON:  03/25/2015  FINDINGS: No mass lesion. No midline shift. No acute hemorrhage or hematoma. No extra-axial fluid collections. No evidence of acute infarction. Brain parenchyma is normal other than mild diffuse atrophy and secondary ventricular dilatation, stable. Osseous structures are normal.  IMPRESSION: Stable appearance of the brain.  Minimal atrophy.   Electronically Signed   By: Lorriane Shire M.D.   On: 04/11/2015 20:22   Ct Head Wo Contrast  03/25/2015   CLINICAL DATA:  Found on floor at work.  Unresponsive.  EXAM: CT HEAD WITHOUT CONTRAST  CT CERVICAL SPINE WITHOUT CONTRAST  TECHNIQUE: Multidetector CT imaging of the head and cervical spine was performed following the standard protocol without intravenous contrast. Multiplanar CT image reconstructions of the cervical spine were also generated.  COMPARISON:  10/27/2012  FINDINGS: CT HEAD FINDINGS  No acute intracranial abnormality. Specifically, no hemorrhage, hydrocephalus, mass lesion, acute infarction, or significant intracranial  injury. No acute calvarial abnormality. Visualized paranasal sinuses and mastoids clear. Orbital soft tissues unremarkable.  CT CERVICAL SPINE FINDINGS  Motion artifact degrades image quality. No fracture visualized. No malalignment. Prevertebral soft tissues are normal. No epidural or paraspinal hematoma.  IMPRESSION: No intracranial abnormality.  No acute bony abnormality in the cervical spine.   Electronically Signed   By: Rolm Baptise M.D.   On: 03/25/2015 15:06   Ct Angio Chest Pe W/cm &/or Wo Cm  04/02/2015   CLINICAL DATA:  Dyspnea.  History of deep venous thrombosis.  EXAM: CT ANGIOGRAPHY CHEST WITH CONTRAST  TECHNIQUE: Multidetector CT imaging of the chest was performed using the standard protocol during bolus administration of intravenous contrast. Multiplanar CT image reconstructions and MIPs were obtained to evaluate the vascular anatomy.  CONTRAST:  57mL OMNIPAQUE IOHEXOL 350 MG/ML SOLN  COMPARISON:  Chest x-ray dated 04/01/2015  FINDINGS: There are no pulmonary emboli. Heart size is normal. No hilar or mediastinal adenopathy. Small amount of calcification in the left anterior descending coronary artery.  Thoracic scoliosis. No acute osseous abnormality. No effusions. 8 mm ground-glass ill-defined abnormality in the right upper lobe. Lungs are otherwise clear. The visualized portion of the upper abdomen is normal.  Review of the MIP images confirms the above findings.  IMPRESSION: 1. No pulmonary emboli. 2. There is an 8 mm ground-glass ill-defined area of abnormal density at the right lung apex best seen on image 25 of series 407. This is indeterminate in etiology.  If the patient is at high risk for bronchogenic carcinoma, follow-up chest CT at 3-50months is recommended. If the patient is at low risk for bronchogenic carcinoma, follow-up chest CT at 6-12 months is recommended. This recommendation follows the consensus statement: Guidelines for Management of Small Pulmonary Nodules Detected on CT  Scans: A Statement from the [employer] as published in Radiology 2005; 237:395-400.   Electronically Signed   By: Lorriane Shire M.D.   On: 04/02/2015 14:36   Ct Cervical Spine Wo Contrast  03/25/2015   CLINICAL DATA:  Found on floor at work.  Unresponsive.  EXAM: CT HEAD WITHOUT CONTRAST  CT CERVICAL SPINE WITHOUT CONTRAST  TECHNIQUE: Multidetector CT imaging of the head and cervical spine was performed following the standard protocol without intravenous contrast. Multiplanar CT image reconstructions of the cervical spine were also generated.  COMPARISON:  10/27/2012  FINDINGS: CT HEAD FINDINGS  No acute intracranial abnormality. Specifically, no hemorrhage, hydrocephalus, mass lesion, acute infarction, or significant intracranial injury. No acute calvarial abnormality. Visualized paranasal sinuses and mastoids clear. Orbital soft tissues unremarkable.  CT CERVICAL SPINE FINDINGS  Motion artifact degrades image quality. No fracture visualized. No malalignment. Prevertebral soft tissues are normal. No epidural or paraspinal hematoma.  IMPRESSION: No intracranial abnormality.  No acute bony abnormality in the cervical spine.   Electronically Signed   By: Rolm Baptise M.D.   On: 03/25/2015 15:06   Dg Chest Port 1 View  04/03/2015   CLINICAL DATA:  Dyspnea  EXAM: PORTABLE CHEST - 1 VIEW  COMPARISON:  04/01/2015  FINDINGS: The heart size and mediastinal contours are within normal limits. Both lungs are clear. The visualized skeletal structures are unremarkable.  IMPRESSION: No active disease.   Electronically Signed   By: Inez Catalina M.D.   On: 04/03/2015 10:39   Dg Chest Port 1 View  04/01/2015   CLINICAL DATA:  Cough.  History of hypertension.  Initial encounter.  EXAM: PORTABLE CHEST - 1 VIEW  COMPARISON:  03/25/2015 radiographs.  FINDINGS: 1129 hr. Interval extubation with removal of the nasogastric tube and central line. There is improved aeration of the lung bases which are now clear. The heart  size and mediastinal contours are stable. There is no pleural effusion or pneumothorax.  IMPRESSION: No active cardiopulmonary process following extubation.   Electronically Signed   By: Richardean Sale M.D.   On: 04/01/2015 12:22   Dg Chest Port 1 View  03/25/2015   CLINICAL DATA:  Central line placement.  Hypothermia.  EXAM: PORTABLE CHEST - 1 VIEW  COMPARISON:  03/25/2015.  FINDINGS: Support apparatus: Endotracheal tube tip 1 cm from the carina. Enteric tube is present doubled back upon itself with the tip at the cardia of the stomach.  New LEFT subclavian central line is present. The tip is in the lower superior vena cava at the cavoatrial junction.  Cardiomediastinal Silhouette:  Unchanged.  Lungs: Opacity at the LEFT costophrenic angle, likely representing atelectasis. Airspace disease could also produce this appearance. No pneumothorax.  Effusions:  None.  Other:  None.  IMPRESSION: 1. Support apparatus as described above. The endotracheal tube has been retracted but remains 1 cm from the carina. 2. Uncomplicated interval placement of LEFT subclavian central line with the tip at the junction of the superior vena cava and RIGHT atrium. 3. Opacification at the LEFT costophrenic angle which may represent atelectasis or airspace disease.   Electronically Signed   By: Dereck Ligas M.D.   On: 03/25/2015 21:01   Dg Chest Port 1 View  03/25/2015   CLINICAL DATA:  Intubation, cardiac arrest.  EXAM: PORTABLE CHEST - 1 VIEW  COMPARISON:  01/23/2015  FINDINGS: Endotracheal tube has tip in the origin of the left mainstem bronchus. This could be pulled back approximately 3 cm. Nasogastric tube is coiled once over the stomach with tip and side-port over the stomach in the left upper quadrant.  Lungs are hypoinflated without focal consolidation or effusion. Cardiomediastinal silhouette is within normal. Remainder of the exam is unchanged.  IMPRESSION: Hypoinflation without acute cardiopulmonary disease.  Tubes and  lines as described. Note that the endotracheal tube has tip  over the origin of the left mainstem bronchus.  These results were called by telephone at the time of interpretation on 03/25/2015 at 4:03 pm to Dr. Thomasene Lot, who verbally acknowledged these results.   Electronically Signed   By: Marin Olp M.D.   On: 03/25/2015 16:03   Mr Card Morphology Wo/w Cm  03/30/2015   CLINICAL DATA:  49 year old female post cardiac arrest. Normal cardiac catheterization. Evaluate for myocarditis and ARVC.  EXAM: CARDIAC MRI  TECHNIQUE: The patient was scanned on a 1.5 Tesla GE magnet. A dedicated cardiac coil was used. Functional imaging was done using Fiesta sequences. 2,3, and 4 chamber views were done to assess for RWMA's. Modified Simpson's rule using a short axis stack was used to calculate an ejection fraction on a dedicated work Conservation officer, nature. The patient received 32 cc of Multihance. After 10 minutes inversion recovery sequences were used to assess for infiltration and scar tissue.  CONTRAST:  32 cc  of Multihance  FINDINGS: 1. Normal left ventricular size, thickness and systolic function (LVEF = 65%). There is of the apical inferior wall.  LVEDD:  49 mm  LVESD:  31 mm  LVEDV:  111 ml  LVESV:  38 ml  SV:  72 ml  LV CO:  5.9 L/minute  Myocardial mass:  101 g  2. Normal right ventricular size, thickness and systolic function (LVEF = 63%). There are no regional wall motion abnormalities.  RVEDV:  114 ml  RVESV:  42 ml  SV:  72 ml  RV CO:  5.9 L/minute  3.  Mild left atrial dilatation.  4.  Mild mitral and mild tricuspid regurgitation.  5. Normal size of the aorta and main pulmonary artery.  6. There is late subendocardial late gadolinium enhancement in the apical inferior wall.  IMPRESSION: 1. Normal left ventricular size, thickness and systolic function (LVEF = 65%) with hypokinesis of the apical inferior wall.  2. Normal right ventricular size, thickness and systolic function (LVEF = 63%) with no regional  wall motion abnormalities.  3.  Mild left atrial dilatation.  4.  Mild mitral and mild tricuspid regurgitation.  5. A subendocardial late gadolinium enhancement in the apical inferior wall is consistent with prior subendocardial infarct.  6.  There is no evidence for ARVC.  Ena Dawley   Electronically Signed   By: Ena Dawley   On: 03/30/2015 18:34    Microbiology: Recent Results (from the past 240 hour(s))  Culture, Urine     Status: None   Collection Time: 04/09/15  6:17 AM  Result Value Ref Range Status   Specimen Description URINE, CLEAN CATCH  Final   Special Requests NONE  Final   Culture MULTIPLE SPECIES PRESENT, SUGGEST RECOLLECTION  Final   Report Status 04/10/2015 FINAL  Final     Labs: Basic Metabolic Panel:  Recent Labs Lab 04/08/15 0543 04/09/15 0325 04/09/15 0328 04/10/15 0555 04/11/15 0310 04/12/15 0525  NA 138 140  --  136 138 140  K 3.9 4.3  --  3.9 4.5 4.6  CL 103 103  --  98* 103 107  CO2 28 24  --  29 29 27   GLUCOSE 107* 79  --  93 103* 102*  BUN 13 7  --  8 12 10   CREATININE 0.57 0.54  --  0.62 0.59 0.61  CALCIUM 8.6* 8.9  --  8.4* 8.6* 8.5*  MG 2.0  --  2.0 1.9 2.0 2.1  PHOS 4.0 4.4  --  5.0* 4.6 4.4   Liver Function Tests:  Recent Labs Lab 04/08/15 0543 04/09/15 0325 04/10/15 0555 04/11/15 0310 04/12/15 0525  ALBUMIN 2.7* 3.0* 2.8* 2.7* 2.7*   No results for input(s): LIPASE, AMYLASE in the last 168 hours. No results for input(s): AMMONIA in the last 168 hours. CBC:  Recent Labs Lab 04/10/15 0555  WBC 6.9  HGB 11.3*  HCT 35.3*  MCV 95.4  PLT 475*   Cardiac Enzymes: No results for input(s): CKTOTAL, CKMB, CKMBINDEX, TROPONINI in the last 168 hours. BNP: BNP (last 3 results)  Recent Labs  03/25/15 2140  BNP 146.1*    ProBNP (last 3 results) No results for input(s): PROBNP in the last 8760 hours.  CBG:  Recent Labs Lab 04/08/15 1632 04/08/15 2123 04/09/15 0757 04/09/15 1143 04/09/15 1630  GLUCAP 112* 90  88 119* 86       Signed:  Jeriko Kowalke N  [facility] 04/12/2015, 8:51 AM

## 2015-04-12 NOTE — Telephone Encounter (Signed)
TCM w/ Suanne Marker 8/31 @ 330pm per [employer]

## 2015-04-12 NOTE — Care Management Note (Signed)
Case Management Note  Patient Details  Name: Claire Mcdonald MRN: [id number] Date of Birth: June 16, 1966  Subjective/Objective:    Pt plan for d/c today.                 Action/Plan: Pt does not have a qualifying diagnosis for home Physical Therapy:  CM did call the [place] [facility]. CM did place an EPIC referral for Outpatient Services. CM will also fax referral to office. [place] [facility] to call pt next week to speak with her in ref to appointments. CM did speak with pt in regards that she may only receive one visit due to Medicaid Guidelines. CM did speak with pt in regards to calling Medicaid to see how many visits she would receive via outpatient PT and Cardiac Rehab. CSW did speak with pt in regards to transportation issues as well. CM did speak with Mother n law and she was utilized for home transportation. Per Mother n law she will be able to assist with transportation if pt did not have anyone else to call. Pt states she has a RW at home and will not need any DME at this time. No further needs at this time.     Expected Discharge Date:                  Expected Discharge Plan:  Home/Self Care  In-House Referral:  Clinical Social Work Youth worker.)  Discharge planning Services  CM Consult  Post Acute Care Choice:  NA Choice offered to:  NA  DME Arranged:  N/A DME Agency:  NA  HH Arranged:  NA HH Agency:  NA  Status of Service:  Completed, signed off  Medicare Important Message Given:    Date Medicare IM Given:    Medicare IM give by:    Date Additional Medicare IM Given:    Additional Medicare Important Message give by:     If discussed at [place] of Stay Meetings, dates discussed:    Additional Comments:  Bethena Roys, RN 04/12/2015, 11:37 AM

## 2015-04-12 NOTE — Progress Notes (Signed)
Patient Name: Claire Mcdonald      SUBJECTIVE: feels better and walked yesterday  Past Medical History  Diagnosis Date  . Hypertension   . High cholesterol   . Anxiety   . Depression   . Allergy   . GERD (gastroesophageal reflux disease)   . Migraine   . Diverticulosis     seen on CT 2015  . Obesity (BMI 30-39.9)   . DVT (deep venous thrombosis)     Scheduled Meds:  Scheduled Meds: . atorvastatin  80 mg Oral q1800  . carbamide peroxide  5 drop Both Ears BID  . cefTRIAXone (ROCEPHIN)  IV  1 g Intravenous Q24H  . fluticasone  1 spray Each Nare Daily  . levETIRAcetam  500 mg Oral BID  . magnesium oxide  400 mg Oral Daily  . metoprolol tartrate  12.5 mg Oral BID  . topiramate  100 mg Oral BID  . Warfarin - Pharmacist Dosing Inpatient   Does not apply q1800   Continuous Infusions: . sodium chloride 50 mL/hr at 04/11/15 0818   sodium chloride, acetaminophen (TYLENOL) oral liquid 160 mg/5 mL, albuterol, HYDROcodone-acetaminophen, HYDROcodone-homatropine, ondansetron (ZOFRAN) IV, phenol, promethazine, sodium chloride, zolpidem    PHYSICAL EXAM Filed Vitals:   04/11/15 1900 04/11/15 2105 04/11/15 2330 04/12/15 0430  BP: 141/107 103/60  91/56  Pulse: 100 98  82  Temp:    98.8 F (37.1 C)  TempSrc:    Oral  Resp: 14   26  Height:      Weight:   216 lb (97.977 kg) 217 lb 11.2 oz (98.748 kg)  SpO2: 96%   97%   .eps Well developed and nourished in no acute distress HENT normal Neck supple  Clear [place] without significant hematoma Regular rate and rhythm, no murmurs or gallops Abd-soft with active BS No Clubbing cyanosis edema Skin-warm and dry A & Oriented  Grossly normal sensory and motor function   TELEMETRY: Reviewed telemetry pt in *nsr    Intake/Output Summary (Last 24 hours) at 04/12/15 0619 Last data filed at 04/11/15 1900  Gross per 24 hour  Intake    585 ml  Output      0 ml  Net    585 ml    LABS: Basic Metabolic Panel:  Recent  Labs Lab 04/06/15 0547 04/07/15 0544 04/08/15 0543 04/09/15 0325  04/10/15 0555 04/11/15 0310 04/12/15 0525  NA 140 138 138 140  --  136 138 140  K 3.6 3.8 3.9 4.3  --  3.9 4.5 4.6  CL 102 103 103 103  --  98* 103 107  CO2 30 30 28 24   --  29 29 27   GLUCOSE 114* 109* 107* 79  --  93 103* 102*  BUN 8 10 13 7   --  8 12 10   CREATININE 0.62 0.55 0.57 0.54  --  0.62 0.59 0.61  CALCIUM 8.8* 8.6* 8.6* 8.9  --  8.4* 8.6* 8.5*  MG 2.1 2.0 2.0  --   < > 1.9 2.0 2.1  PHOS 4.1 4.2 4.0 4.4  --  5.0* 4.6 4.4  < > = values in this interval not displayed. Cardiac Enzymes: No results for input(s): CKTOTAL, CKMB, CKMBINDEX, TROPONINI in the last 72 hours. CBC:  Recent Labs Lab 04/10/15 0555  WBC 6.9  HGB 11.3*  HCT 35.3*  MCV 95.4  PLT 475*   PROTIME:  Recent Labs  04/10/15 0555 04/11/15 0310 04/12/15 0525  LABPROT  30.2* 28.8* 22.8*  INR 2.95* 2.77* 2.03*   Liver Function Tests:  Recent Labs  04/11/15 0310 04/12/15 0525  ALBUMIN 2.7* 2.7*   No results for input(s): LIPASE, AMYLASE in the last 72 hours. BNP: BNP (last 3 results)  Recent Labs  03/25/15 2140  BNP 146.1*     Device Interrogation: pending   ASSESSMENT AND PLAN:  Principal Problem:   Cardiac arrest Active Problems:   Essential hypertension   Hyperlipemia   DVT (deep venous thrombosis)   Troponin level elevated   Normal coronary arteries   Obesity-BMI 40   Pulmonary nodule seen on imaging study   Chronic anticoagulation  Stable post implant  Evaluation pending Ok with me if home Today  will need device followup and will arrange No driving x 6 mon  Signed, Virl Axe MD  04/12/2015

## 2015-04-13 NOTE — Telephone Encounter (Signed)
Patient contacted regarding discharge from  [facility] on 04/12/2015.  Patient understands to follow up with provider Rosaria Ferries PA on 04/25/2015 at 3:30 p.m. at [address] Patient understands discharge instructions? yes Patient understands medications and regiment? yes Patient understands to bring all medications to this visit? yes

## 2015-04-16 ENCOUNTER — Encounter: Payer: Self-pay | Admitting: Unknown Physician Specialty

## 2015-04-16 ENCOUNTER — Ambulatory Visit (INDEPENDENT_AMBULATORY_CARE_PROVIDER_SITE_OTHER): Payer: Medicaid Other | Admitting: Unknown Physician Specialty

## 2015-04-16 ENCOUNTER — Other Ambulatory Visit: Payer: Self-pay | Admitting: Unknown Physician Specialty

## 2015-04-16 ENCOUNTER — Telehealth: Payer: Self-pay | Admitting: Unknown Physician Specialty

## 2015-04-16 VITALS — BP 115/81 | HR 76 | Temp 98.0°F | Wt 212.4 lb

## 2015-04-16 DIAGNOSIS — I5040 Unspecified combined systolic (congestive) and diastolic (congestive) heart failure: Secondary | ICD-10-CM | POA: Diagnosis not present

## 2015-04-16 DIAGNOSIS — Z7901 Long term (current) use of anticoagulants: Secondary | ICD-10-CM | POA: Diagnosis not present

## 2015-04-16 DIAGNOSIS — I469 Cardiac arrest, cause unspecified: Secondary | ICD-10-CM | POA: Diagnosis not present

## 2015-04-16 DIAGNOSIS — I509 Heart failure, unspecified: Secondary | ICD-10-CM

## 2015-04-16 DIAGNOSIS — G40909 Epilepsy, unspecified, not intractable, without status epilepticus: Secondary | ICD-10-CM | POA: Diagnosis not present

## 2015-04-16 DIAGNOSIS — I2510 Atherosclerotic heart disease of native coronary artery without angina pectoris: Secondary | ICD-10-CM | POA: Diagnosis not present

## 2015-04-16 DIAGNOSIS — Z950 Presence of cardiac pacemaker: Secondary | ICD-10-CM | POA: Diagnosis not present

## 2015-04-16 DIAGNOSIS — I82401 Acute embolism and thrombosis of unspecified deep veins of right lower extremity: Secondary | ICD-10-CM

## 2015-04-16 DIAGNOSIS — R569 Unspecified convulsions: Secondary | ICD-10-CM | POA: Insufficient documentation

## 2015-04-16 LAB — LIPID PANEL PICCOLO, WAIVED
Chol/HDL Ratio Piccolo,Waive: 3.2 mg/dL
Cholesterol Piccolo, Waived: 163 mg/dL (ref <200)
HDL Chol Piccolo, Waived: 51 mg/dL — ABNORMAL LOW (ref >59)
LDL Chol Calc Piccolo Waived: 92 mg/dL (ref <100)
TRIGLYCERIDES PICCOLO,WAIVED: 102 mg/dL (ref <150)
VLDL Chol Calc Piccolo,Waive: 20 mg/dL (ref <30)

## 2015-04-16 LAB — COAGUCHEK XS/INR WAIVED
INR: 2.3 — AB (ref 0.9–1.1)
Prothrombin Time: 28.2 s

## 2015-04-16 MED ORDER — WARFARIN SODIUM 5 MG PO TABS: 5.0000 mg | ORAL_TABLET | Freq: Every day | ORAL | Status: DC

## 2015-04-16 MED ORDER — HYDROCODONE-ACETAMINOPHEN 5-325 MG PO TABS: 1.0000 | ORAL_TABLET | Freq: Four times a day (QID) | ORAL | Status: DC | PRN

## 2015-04-16 NOTE — Assessment & Plan Note (Signed)
Check lipid panel.  Will follow up with cardiologist

## 2015-04-16 NOTE — Assessment & Plan Note (Addendum)
Placed last week.  Dressing changes and monitoring done by cardiology.  Will rx Hydrocodone for pain

## 2015-04-16 NOTE — Assessment & Plan Note (Signed)
Second DVT.  INR is 2.3.  Continue Warfarin 5 mg.  Consider hematology referral.

## 2015-04-16 NOTE — Assessment & Plan Note (Signed)
Per neurology 

## 2015-04-16 NOTE — Telephone Encounter (Signed)
Pts daughter states pt only has enough warfrin for 11 days.  Pt comes back in 14 so will need to call some in.    [facility] in [place].

## 2015-04-16 NOTE — Progress Notes (Signed)
BP 115/81 mmHg  Pulse 76  Temp(Src) 98 F (36.7 C)  Wt 212 lb 6.4 oz (96.344 kg)  SpO2 96%  LMP 03/25/2015 (Exact Date)   Subjective:    Patient ID: Claire Mcdonald, female    DOB: [birth year], 49 y.o.   MRN: [id number]  HPI: Claire Mcdonald is a 49 y.o. female  Chief Complaint  Patient presents with  . Hospitalization Follow-up   Pt is here accompanied by 2 sons and 2 daughter for a hospital f/u Discharge notes are reviewed.    Pt was in the hospital status post a cardiac arrest resuscitated from sustained v-fib.  She is under the care of Dr. Caryl Comes, her cardiologist, for s/p pacemaker placement and continued cardiac care.  Needs to go to cardiac rehab.  ? Cardiac arrest related to a seizure and under the care of a neurologist.  Unable to drive for 6 months.  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  Daughter is her primary care-giver and lives in [place].  Medicaid will help her with transportation and she has a son in [place] who can come by on occasion.  She is walking distance of a grocery store.  She would like something for pain s/p pacemaker that was placed less than a week ago.  She is not taking pain medications for pain.  She is taking Hydrocodone 5/325 every 6 hours for pain.  Needs to go to cardiac rehab.    DVT Diagnosed with a DVT right leg while in the hospital.  Pt taking Warfarin 5 mg daily.  She will need an INR.  No bleeding or SOB  Migraines Splitting headache in the hospital.  Restarted Topiramate and Cymbalta.        Relevant past medical, surgical, family and social history reviewed and updated as indicated. Interim medical history since our last visit reviewed. Allergies and medications reviewed and updated.  Review of Systems  Constitutional: Positive for activity change.  HENT: Negative.   Eyes: Negative.   Respiratory: Positive for chest tightness. Negative for shortness of breath.   Cardiovascular: Positive for chest pain.       At pacemaker site  Skin:  Negative.   Neurological: Negative.   Psychiatric/Behavioral: Negative.     Per HPI unless specifically indicated above     Objective:    BP 115/81 mmHg  Pulse 76  Temp(Src) 98 F (36.7 C)  Wt 212 lb 6.4 oz (96.344 kg)  SpO2 96%  LMP 03/25/2015 (Exact Date)  Wt Readings from Last 3 Encounters:  04/16/15 212 lb 6.4 oz (96.344 kg)  04/12/15 217 lb 11.2 oz (98.748 kg)  03/25/15 195 lb (88.451 kg)    Physical Exam  Constitutional: She is oriented to person, place, and time. She appears well-developed and well-nourished. No distress.  HENT:  Head: Normocephalic and atraumatic.  Eyes: Conjunctivae and lids are normal. Right eye exhibits no discharge. Left eye exhibits no discharge. No scleral icterus.  Neck: Normal range of motion.  Cardiovascular: Normal rate, regular rhythm and normal heart sounds.   Pulmonary/Chest: Effort normal and breath sounds normal. No respiratory distress.  Abdominal: Normal appearance and bowel sounds are normal. She exhibits no distension. There is no splenomegaly or hepatomegaly. There is no tenderness.  Musculoskeletal: Normal range of motion.  Neurological: She is alert and oriented to person, place, and time.  Skin: Skin is warm, dry and intact. No rash noted. No pallor.  Psychiatric: She has a normal mood and  affect. Her behavior is normal. Judgment and thought content normal.  Vitals reviewed.      Assessment & Plan:   Problem List Items Addressed This Visit      Unprioritized   Cardiac arrest    S/p sustained v tack.  ? Related to seizure      DVT (deep venous thrombosis)   Relevant Orders   CBC with Differential/Platelet   Chronic anticoagulation    Second DVT.  INR is 2.3.  Continue Warfarin 5 mg.  Consider hematology referral.        CAD (coronary artery disease)    Check lipid panel.  Will follow up with cardiologist      Relevant Orders   Amb Referral to Cardiac Rehabilitation   CoaguChek XS/INR Waived   Lipid Panel  Piccolo, Waived   Comprehensive metabolic panel   Seizure disorder    Per neurology      Pacemaker    Placed last week.  Dressing changes and monitoring done by cardiology.  Will rx Hydrocodone for pain      Relevant Medications   HYDROcodone-acetaminophen (NORCO/VICODIN) 5-325 MG per tablet   Heart failure - Primary    Ejection fraciont at or less than 35%.        Relevant Orders   Amb Referral to Cardiac Rehabilitation   CoaguChek XS/INR Waived   Comprehensive metabolic panel       Follow up plan: Return in about 2 weeks (around 04/30/2015).

## 2015-04-16 NOTE — Telephone Encounter (Signed)
Routing to provider  

## 2015-04-16 NOTE — Assessment & Plan Note (Signed)
Ejection fraciont at or less than 35%.

## 2015-04-16 NOTE — Assessment & Plan Note (Addendum)
S/p sustained v tack.  ? Related to seizure

## 2015-04-17 ENCOUNTER — Encounter: Payer: Self-pay | Admitting: Unknown Physician Specialty

## 2015-04-17 LAB — COMPREHENSIVE METABOLIC PANEL
ALT: 19 IU/L (ref 0–32)
AST: 20 IU/L (ref 0–40)
Albumin/Globulin Ratio: 1.5 (ref 1.1–2.5)
Albumin: 3.7 g/dL (ref 3.5–5.5)
Alkaline Phosphatase: 82 IU/L (ref 39–117)
BUN/Creatinine Ratio: 21 (ref 9–23)
BUN: 14 mg/dL (ref 6–24)
Bilirubin Total: 0.3 mg/dL (ref 0.0–1.2)
CO2: 21 mmol/L (ref 18–29)
Calcium: 9 mg/dL (ref 8.7–10.2)
Chloride: 105 mmol/L (ref 97–108)
Creatinine, Ser: 0.67 mg/dL (ref 0.57–1.00)
GFR calc Af Amer: 121 mL/min/{1.73_m2} (ref >59)
GFR calc non Af Amer: 105 mL/min/{1.73_m2} (ref >59)
Globulin, Total: 2.4 g/dL (ref 1.5–4.5)
Glucose: 97 mg/dL (ref 65–99)
Potassium: 4.4 mmol/L (ref 3.5–5.2)
Sodium: 141 mmol/L (ref 134–144)
Total Protein: 6.1 g/dL (ref 6.0–8.5)

## 2015-04-17 LAB — CBC WITH DIFFERENTIAL/PLATELET
Basophils Absolute: 0 10*3/uL (ref 0.0–0.2)
Basos: 0 %
EOS (ABSOLUTE): 0.1 10*3/uL (ref 0.0–0.4)
Eos: 2 %
Hematocrit: 35.4 % (ref 34.0–46.6)
Hemoglobin: 12 g/dL (ref 11.1–15.9)
Immature Grans (Abs): 0 10*3/uL (ref 0.0–0.1)
Immature Granulocytes: 0 %
Lymphocytes Absolute: 1.7 10*3/uL (ref 0.7–3.1)
Lymphs: 27 %
MCH: 30.4 pg (ref 26.6–33.0)
MCHC: 33.9 g/dL (ref 31.5–35.7)
MCV: 90 fL (ref 79–97)
Monocytes Absolute: 0.4 10*3/uL (ref 0.1–0.9)
Monocytes: 7 %
Neutrophils Absolute: 3.8 10*3/uL (ref 1.4–7.0)
Neutrophils: 64 %
Platelets: 393 10*3/uL — ABNORMAL HIGH (ref 150–379)
RBC: 3.95 x10E6/uL (ref 3.77–5.28)
RDW: 14.3 % (ref 12.3–15.4)
WBC: 6.1 10*3/uL (ref 3.4–10.8)

## 2015-04-25 ENCOUNTER — Ambulatory Visit (INDEPENDENT_AMBULATORY_CARE_PROVIDER_SITE_OTHER): Payer: Medicaid Other | Admitting: Physician Assistant

## 2015-04-25 ENCOUNTER — Encounter: Payer: Self-pay | Admitting: Physician Assistant

## 2015-04-25 ENCOUNTER — Ambulatory Visit (INDEPENDENT_AMBULATORY_CARE_PROVIDER_SITE_OTHER): Payer: Medicaid Other | Admitting: *Deleted

## 2015-04-25 VITALS — BP 92/60 | HR 64 | Ht 61.0 in

## 2015-04-25 DIAGNOSIS — I469 Cardiac arrest, cause unspecified: Secondary | ICD-10-CM

## 2015-04-25 LAB — CUP PACEART INCLINIC DEVICE CHECK
Battery Remaining Longevity: 96 mo
Date Time Interrogation Session: 20160831154958
HIGH POWER IMPEDANCE MEASURED VALUE: 60.75 Ohm
Lead Channel Pacing Threshold Amplitude: 0.5 V
Lead Channel Pacing Threshold Pulse Width: 0.5 ms
Lead Channel Sensing Intrinsic Amplitude: 7.3 mV
Lead Channel Setting Pacing Pulse Width: 0.5 ms
Lead Channel Setting Sensing Sensitivity: 0.5 mV
MDC IDC MSMT LEADCHNL RV IMPEDANCE VALUE: 450 Ohm
MDC IDC SET LEADCHNL RV PACING AMPLITUDE: 3.5 V
MDC IDC STAT BRADY RV PERCENT PACED: 0 %
Pulse Gen Serial Number: 7181802
Zone Setting Detection Interval: 270 ms

## 2015-04-25 MED ORDER — HYDROCORTISONE 0.5 % EX CREA: 1.0000 "application " | TOPICAL_CREAM | CUTANEOUS | Status: DC | PRN

## 2015-04-25 MED ORDER — ACETAMINOPHEN 500 MG PO TABS: 1000.0000 mg | ORAL_TABLET | Freq: Three times a day (TID) | ORAL | Status: DC

## 2015-04-25 NOTE — Progress Notes (Signed)
Cardiology Office Note   Date:  04/25/2015   ID:  Claire Mcdonald, DOB 1966-03-19, MRN [id number]  PCP:  [name], NP  Cardiologist:  Dr Meda Coffee, Dr Suzie Portela, PA-C   Chief complaint: Post hospital follow-up  History of Present Illness: Claire Mcdonald is a 49 y.o. female with a history of HTN, HL, anxiety, depression, and obesity. She collapsed at work with a V. fib arrest, was taken to [facility] and transferred to [facility]. She had a nonobstructive CAD and normal LV function. She had no critical electrolyte abnormalities. Cardiac MRI studies showed a small prior infarct. She was seen by EP and had an MDT ICD implanted.   Claire Mcdonald presents for follow-up.  Since discharge from the hospital, she has trouble. She hurts all over all the time. She also has issues with back pain that is chronic. She denies shortness of breath or palpitations. Her ICD has not fired. Her chest wall and ICD site are very sore. She has some wounds on her chest from the adhesive from the defibrillator pads.  She does not feel she has been able to increase her activity very much. She is tired all the time. She does not feel like she can walk very far. Not being able to drive his inconvenient to her, but her ex-mother-in-law and son are helping.  Past Medical History  Diagnosis Date  . Hypertension   . High cholesterol   . Anxiety   . Depression   . Allergy   . GERD (gastroesophageal reflux disease)   . Migraine   . Diverticulosis     seen on CT 2015  . Obesity (BMI 30-39.9)   . DVT (deep venous thrombosis)   . 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 fib, EF nl, non-obs CAD; [employer] Jude ICD   Past Surgical History  Procedure Laterality Date  . Tubal ligation  1998  . Ovarian cyst removal Left   . Central line  03/25/2015       . Hand surgery Left   . Cardiac catheterization N/A 03/27/2015    Procedure: Left Heart Cath and Coronary Angiography;  Surgeon: Troy Sine, MD;   LAD 10%/30%, CFX and RCA OK, EF normal  . Ep implantable device N/A 04/11/2015    Procedure: ICD Implant;  Surgeon: Deboraha Sprang, MD;  [facility] ICD, serial number [id number]   Current Outpatient Prescriptions  Medication Sig Dispense Refill  . albuterol (PROVENTIL HFA;VENTOLIN HFA) 108 (90 BASE) MCG/ACT inhaler Inhale 2 puffs into the lungs every 6 (six) hours as needed for wheezing or shortness of breath. 1 Inhaler 0  . atorvastatin (LIPITOR) 20 MG tablet Take 1 tablet (20 mg total) by mouth daily. 30 tablet 1  . DULoxetine (CYMBALTA) 60 MG capsule Take 60 mg by mouth daily.    . fluticasone (FLONASE) 50 MCG/ACT nasal spray Place 1 spray into both nostrils daily. 16 g 0  . HYDROcodone-acetaminophen (NORCO/VICODIN) 5-325 MG per tablet Take 1 tablet by mouth every 6 (six) hours as needed for moderate pain. 40 tablet 0  . levETIRAcetam (KEPPRA) 500 MG tablet Take 1 tablet (500 mg total) by mouth 2 (two) times daily. 60 tablet 2  . metoprolol tartrate (LOPRESSOR) 25 MG tablet Take 0.5 tablets (12.5 mg total) by mouth 2 (two) times daily. 60 tablet 1  . omeprazole (PRILOSEC) 20 MG capsule Take 20 mg by mouth daily.    Marland Kitchen topiramate (TOPAMAX) 100  MG tablet Take 1 tablet (100 mg total) by mouth 2 (two) times daily. 60 tablet 1  . warfarin (COUMADIN) 5 MG tablet Take 1 tablet (5 mg total) by mouth daily. 30 tablet 0  . acetaminophen (TYLENOL) 500 MG tablet Take 2 tablets (1,000 mg total) by mouth 3 (three) times daily. For ONE WEEK 30 tablet 0  . hydrocortisone cream 0.5 % Apply 1 application topically as needed for itching (for redness at ICD site). 30 g 0   No current facility-administered medications for this visit.    Allergies:   Toradol; Tramadol; and Zofran    Social History:  The patient  reports that she has never smoked. She has never used smokeless tobacco. She reports that she does not drink alcohol or use illicit drugs.   Family History:  The patient's family history includes  Allergies in her daughter, son, and son; Asthma in her father and mother; Cancer in her maternal grandmother; Depression in her mother; Diabetes in her father; Early death in her maternal grandmother and paternal grandmother; Heart disease in her son; Hyperlipidemia in her father and mother; Hypertension in her father and mother; Kidney disease in her father. There is no history of Stroke.    ROS:  Please see the history of present illness. All other systems are reviewed and negative.    PHYSICAL EXAM: VS:  BP 92/60 mmHg  Pulse 64  Ht 5\' 1"  (1.549 m)  SpO2 98%  LMP 03/25/2015 (Exact Date) , BMI There is no weight on file to calculate BMI. GEN: Well nourished, well developed, in no acute distress HEENT: normal Neck: no JVD, carotid bruits, or masses Cardiac: RRR; no murmurs, rubs, or gallops, no edema  Respiratory:  clear to auscultation bilaterally, normal work of breathing GI: soft, nontender, nondistended, + BS MS: no deformity or atrophy Skin: warm and dry, and area of redness and irritation around the ICD site but it does not look infected. She also has some wounds from the adhesive on the defibrillator pads, these are not draining and look like they are healing. Neuro:  Strength and sensation are intact; patient complains of pain at every area that was palpated including upper extremities, lower extremities, chest wall and abdomen Psych: Depressed mood, full affect   EKG:  EKG is not ordered today.  Recent Labs: 03/25/2015: B Natriuretic Peptide 146.1* 04/10/2015: Hemoglobin 11.3*; Platelets 475* 04/12/2015: Magnesium 2.1 04/16/2015: ALT 19; BUN 14; Creatinine, Ser 0.67; Potassium 4.4; Sodium 141    Lipid Panel    Component Value Date/Time   TRIG 93 03/28/2015 2120     Wt Readings from Last 3 Encounters:  04/16/15 212 lb 6.4 oz (96.344 kg)  04/12/15 217 lb 11.2 oz (98.748 kg)  03/25/15 195 lb (88.451 kg)     Other studies Reviewed: Additional studies/ records that were  reviewed today include: Hospital records, cath report, ICD implant report.  ASSESSMENT AND PLAN:  1.  V. fib arrest: Her ICD site was checked and her ICD was interrogated. There are no current issues with this. She is to keep scheduled follow-up with Dr. Caryl Comes.  2. Pain issues: She has chronic pain issues and is also complaining of pain at the site. Advised that I did not wish for her to take her previously prescribed Norco except at bedtime, she could use extra strength Tylenol, 3 times a day as needed to try and get some of her pain under control and make it easier for her to increase her activity.  3. Wounds from adhesive and irritation from Steri-Strips: She is encouraged to use antibiotics cream and dressings over the wounds on her chest. She is to keep an eye on them and let us know if they do not heal well. It is permissible to use over-the-counter hydrocortisone cream on the irritation around her ICD site but she was carefully advised not to put over the incision.  4. DVT on Coumadin: Her Coumadin is being managed by her primary care physician and this is confirmed. She is aware that she needs to follow-up with her primary care physician and continue the Coumadin checks.  Current medicines are reviewed at length with the patient today.  The patient does not have concerns regarding medicines.  The following changes have been made:  Add Tylenol and hydrocortisone cream  Labs/ tests ordered today include:    No orders of the defined types were placed in this encounter.   Disposition:   FU with Dr. Caryl Comes in 3 months as scheduled  Signed, Lenoard Aden  04/25/2015 5:18 PM    [facility], [place], [place]  [address] Phone: [phone]; Fax: [phone]

## 2015-04-25 NOTE — Patient Instructions (Signed)
Medication Instructions:  Your physician has recommended you make the following change in your medication:  1) START Tylenol 1000 mg by mouth THREE times a day for ONE week 2) USE Hydrocortisone Cream, as needed, around ICD site to help with redness   Labwork: NONE  Testing/Procedures: NONE  Follow-Up: Keep scheduled follow up appt with Dr. Caryl Comes  Any Other Special Instructions Will Be Listed Below (If Applicable).

## 2015-04-25 NOTE — Progress Notes (Signed)
Wound check appointment. Bandage removed. Wound without redness or edema. Incision edges approximated, wound well healed. Normal device function. Threshold, sensing, and impedances consistent with implant measurements. Device programmed at 3.5V for extra safety margin until 3 month visit. Histogram distribution appropriate for patient and level of activity. No ventricular arrhythmias noted. Patient educated about wound care, arm mobility, lifting restrictions, shock plan. ROV 07-16-15 with SK.

## 2015-05-01 ENCOUNTER — Encounter: Payer: Self-pay | Admitting: Unknown Physician Specialty

## 2015-05-01 ENCOUNTER — Ambulatory Visit (INDEPENDENT_AMBULATORY_CARE_PROVIDER_SITE_OTHER): Payer: Medicaid Other | Admitting: Unknown Physician Specialty

## 2015-05-01 VITALS — BP 134/86 | HR 65 | Temp 97.6°F | Wt 206.6 lb

## 2015-05-01 DIAGNOSIS — G4700 Insomnia, unspecified: Secondary | ICD-10-CM | POA: Diagnosis not present

## 2015-05-01 DIAGNOSIS — I82401 Acute embolism and thrombosis of unspecified deep veins of right lower extremity: Secondary | ICD-10-CM

## 2015-05-01 DIAGNOSIS — R630 Anorexia: Secondary | ICD-10-CM

## 2015-05-01 DIAGNOSIS — D689 Coagulation defect, unspecified: Secondary | ICD-10-CM | POA: Diagnosis not present

## 2015-05-01 LAB — COAGUCHEK XS/INR WAIVED
INR: 5.7 — ABNORMAL HIGH (ref 0.9–1.1)
Prothrombin Time: 68.2 s

## 2015-05-01 NOTE — Progress Notes (Addendum)
   BP 134/86 mmHg  Pulse 65  Temp(Src) 97.6 F (36.4 C)  Wt 206 lb 9.6 oz (93.713 kg)  SpO2 96%  LMP 04/26/2015 (Exact Date)   Subjective:    Patient ID: Claire Mcdonald, female    DOB: Jan 15, 1966, 49 y.o.   MRN: [id number]  CC: Coumadin management  HPI: This patient is a 49 y.o. female who presents for coumadin management. The expected duration of coumadin treatment is lifelong The reason for anticoagulation is DVT  Present Coumadin dose: 5mg  Goal: 2.0-3.0 The patient does not have an active anticoagulation episode. Excessive bruising: no Nose bleeding: no Rectal bleeding: no Prolonged menstrual cycles: no Eating diet with consistent amounts of foods containing Vitamin K:yes Any recent antibiotic use? no   Insomnia C/o not sleeping at night.  She does sleep during the day.    Anorexia Not eating much  ROS: Per HPI unless specifically indicated above     Objective:    BP 134/86 mmHg  Pulse 65  Temp(Src) 97.6 F (36.4 C)  Wt 206 lb 9.6 oz (93.713 kg)  SpO2 96%  LMP 04/26/2015 (Exact Date)  Wt Readings from Last 3 Encounters:  05/01/15 206 lb 9.6 oz (93.713 kg)  04/16/15 212 lb 6.4 oz (96.344 kg)  04/12/15 217 lb 11.2 oz (98.748 kg)     General: Well appearing, well nourished in no distress.  Normal mood and affect. Skin: No excessive bruising or rash  Last INR: 5.7    Last CBC:  Lab Results  Component Value Date   WBC 6.1 04/16/2015   HGB 11.3* 04/10/2015   HCT 35.4 04/16/2015   MCV 95.4 04/10/2015   PLT 475* 04/10/2015      Physical Exam  Constitutional: She is oriented to person, place, and time and well-developed, well-nourished, and in no distress.  HENT:  Head: Normocephalic.  Neck: Normal range of motion. Neck supple.  Cardiovascular: Normal rate, regular rhythm and normal heart sounds.   Pulmonary/Chest: Effort normal and breath sounds normal.  Musculoskeletal: Normal range of motion.  Neurological: She is alert and oriented to person, place,  and time.  Skin: Skin is warm and dry.      Assessment:     ICD-9-CM ICD-10-CM   1. Coagulation disorder 286.9 D68.9 CoaguChek XS/INR Waived  2. Anorexia 783.0 R63.0    Discussed bananas, peanut butter and honey, and baked potatoes  3. DVT (deep venous thrombosis), right 453.40 I82.401   4. Insomnia 780.52 G47.00    discussed to slowly work hours to night time. About 2 hours at a time. Remove TV    Plan:   Discussed current plan face-to-face with patient. For coumadin dosing, elected to hold dose. Will plan to recheck INR 3 days.  Pt ed on injury prevention.  To thje ER if headache, mental status or vision changes.  Unable to have her come in sooner due to transportation issues.

## 2015-05-04 ENCOUNTER — Encounter: Payer: Self-pay | Admitting: Unknown Physician Specialty

## 2015-05-04 ENCOUNTER — Ambulatory Visit (INDEPENDENT_AMBULATORY_CARE_PROVIDER_SITE_OTHER): Payer: Medicaid Other | Admitting: Unknown Physician Specialty

## 2015-05-04 VITALS — BP 118/86 | HR 67 | Temp 98.0°F | Ht 61.0 in | Wt 206.0 lb

## 2015-05-04 DIAGNOSIS — I82401 Acute embolism and thrombosis of unspecified deep veins of right lower extremity: Secondary | ICD-10-CM | POA: Diagnosis not present

## 2015-05-04 DIAGNOSIS — D689 Coagulation defect, unspecified: Secondary | ICD-10-CM

## 2015-05-04 LAB — COAGUCHEK XS/INR WAIVED
INR: 1.5 — ABNORMAL HIGH (ref 0.9–1.1)
Prothrombin Time: 17.6 s

## 2015-05-04 MED ORDER — WARFARIN SODIUM 4 MG PO TABS: 4.0000 mg | ORAL_TABLET | Freq: Every day | ORAL | Status: DC

## 2015-05-04 MED ORDER — APIXABAN 5 MG PO TABS: 5.0000 mg | ORAL_TABLET | Freq: Two times a day (BID) | ORAL | Status: DC

## 2015-05-04 NOTE — Patient Instructions (Addendum)
Start Eliquis 5 mg two twice a day for 1 week and then one twice a day.

## 2015-05-04 NOTE — Progress Notes (Signed)
   BP 118/86 mmHg  Pulse 67  Temp(Src) 98 F (36.7 C)  Ht 5\' 1"  (1.549 m)  Wt 206 lb (93.441 kg)  BMI 38.94 kg/m2  SpO2 96%  LMP 04/26/2015 (Exact Date)   Subjective:    Patient ID: Claire Mcdonald, female    DOB: [birth year], 49 y.o.   MRN: [id number]  CC: Coumadin management  HPI: This patient is a 49 y.o. female who presents for coumadin management. The expected duration of coumadin treatment is lifelong The reason for anticoagulation is DVT  Present Coumadin dose: held dose due to a very elevated INR of 5.7% 2 days ago.   Goal: 2.0-3.0 The patient does not have an active anticoagulation episode. Excessive bruising: no Nose bleeding: no Rectal bleeding: no Prolonged menstrual cycles: no Eating diet with consistent amounts of foods containing Vitamin K:Started eating broccoli Any recent antibiotic use? no    ROS: Per HPI unless specifically indicated above     Objective:    BP 118/86 mmHg  Pulse 67  Temp(Src) 98 F (36.7 C)  Ht 5\' 1"  (1.549 m)  Wt 206 lb (93.441 kg)  BMI 38.94 kg/m2  SpO2 96%  LMP 04/26/2015 (Exact Date)  Wt Readings from Last 3 Encounters:  05/04/15 206 lb (93.441 kg)  05/01/15 206 lb 9.6 oz (93.713 kg)  04/16/15 212 lb 6.4 oz (96.344 kg)     General: Well appearing, well nourished in no distress.  Normal mood and affect. Skin: No excessive bruising or rash  Last INR: 1.5    Last CBC:  Lab Results  Component Value Date   WBC 6.1 04/16/2015   HGB 11.3* 04/10/2015   HCT 35.4 04/16/2015   MCV 95.4 04/10/2015   PLT 475* 04/10/2015      Physical Exam  Constitutional: She is oriented to person, place, and time and well-developed, well-nourished, and in no distress.  HENT:  Head: Normocephalic.  Neck: Normal range of motion. Neck supple.  Cardiovascular: Normal rate, regular rhythm and normal heart sounds.   Pulmonary/Chest: Effort normal and breath sounds normal.  Musculoskeletal: Normal range of motion.  Neurological: She is alert and  oriented to person, place, and time.  Skin: Skin is warm and dry.      Assessment:     ICD-9-CM ICD-10-CM   1. Coagulation disorder 286.9 D68.9 CoaguChek XS/INR Waived  2. DVT (deep venous thrombosis), right 453.40 I82.401    It is difficult for pt to maintain compliance with Coumadin due to necessary frequent visits and INR checks.  Will switch to Eliquis 5 mg BID after initial dosing of 5 mg 2 tablets BID for 7 days.    Plan:   Discussed current plan face-to-face with patient. Written instructions given.  Recheck in 3 months

## 2015-05-07 ENCOUNTER — Telehealth: Payer: Self-pay

## 2015-05-07 NOTE — Telephone Encounter (Signed)
Pharmacy called they did not receive the prescription for Eliquis, I verbally called it in.

## 2015-05-14 ENCOUNTER — Encounter: Payer: Medicaid Other | Attending: Internal Medicine | Admitting: *Deleted

## 2015-05-14 VITALS — Ht 61.75 in | Wt 204.8 lb

## 2015-05-14 DIAGNOSIS — I214 Non-ST elevation (NSTEMI) myocardial infarction: Secondary | ICD-10-CM | POA: Diagnosis present

## 2015-05-14 NOTE — Patient Instructions (Signed)
Patient Instructions  Patient Details  Name: ANGEE GUPTON MRN: [id number] Date of Birth: [birth year] Referring Provider:  Deboraha Sprang, MD  Below are the personal goals you chose as well as exercise and nutrition goals. Our goal is to help you keep on track towards obtaining and maintaining your goals. We will be discussing your progress on these goals with you throughout the program.  Initial Exercise Prescription:     Initial Exercise Prescription - 05/14/15 1600    Date of Initial Exercise Prescription   Date 05/14/15   Bike   Level 0.5   Minutes 15  5 min x 3 with rest in between if needed   Recumbant Bike   Level 1   Watts 20   Minutes 15  5 min x 3 with rest in between if needed   NuStep   Level 2   Watts 20   Minutes 15  5 min x 3 with rest in between if needed   Arm Ergometer   Level 1   Watts 8   Minutes 15  5 min x 3 with rest in between if needed   Arm/Foot Ergometer   Level 1   Watts 10   Minutes 15  5 min x 3 with rest in between if needed   Cybex   Level 1   RPM 30   Minutes 15  5 min x 3 with rest in between if needed   Recumbant Elliptical   Level 1   Watts 20   Minutes 15  5 min x 3 with rest in between if needed   REL-XR   Level 1   Watts 20   Minutes 15  5 min x 3 with rest in between if needed   Prescription Details   Frequency (times per week) 3   Duration Progress to 30 minutes of continuous aerobic without signs/symptoms of physical distress   Intensity   THRR REST +  20   Ratings of Perceived Exertion 11-13   Progression Continue progressive overload as per policy without signs/symptoms or physical distress.   Resistance Training   Training Prescription Yes   Weight 1   Reps 10-12      Exercise Goals: Frequency: Be able to perform aerobic exercise three times per week working toward 3-5 days per week.  Intensity: Work with a perceived exertion of 11 (fairly light) - 15 (hard) as tolerated. Follow your new exercise  prescription and watch for changes in prescription as you progress with the program. Changes will be reviewed with you when they are made.  Duration: You should be able to do 30 minutes of continuous aerobic exercise in addition to a 5 minute warm-up and a 5 minute cool-down routine.  Nutrition Goals: Your personal nutrition goals will be established when you do your nutrition analysis with the dietician.  The following are nutrition guidelines to follow: Cholesterol < 200mg /day Sodium < 1500mg /day Fiber: Women under 50 yrs - 25 grams per day  Personal Goals:     Personal Goals and Risk Factors at Admission - 05/14/15 1442    Personal Goals and Risk Factors on Admission    Weight Management Yes;Obesity   Intervention Learn and follow the exercise and diet guidelines while in the program. Utilize the nutrition and education classes to help gain knowledge of the diet and exercise expectations in the program   Intervention Provide weight management tools through evaluation completed by registered dietician and exercise physiologist.  Establish a goal  weight with participant.   Admit Weight 204 lb (92.534 kg)   Goal Weight 150 lb (68.04 kg)   Increase Aerobic Exercise and Physical Activity Yes   Intervention While in program, learn and follow the exercise prescription taught. Start at a low level workload and increase workload after able to maintain previous level for 30 minutes. Increase time before increasing intensity.   Understand more about Heart/Pulmonary Disease. Yes   Intervention While in program utilize professionals for any questions, and attend the education sessions. Great websites to use are www.americanheart.org or www.lung.org for reliable information.   Diabetes No   Hypertension Yes   Goal Participant will see blood pressure controlled within the values of 140/72mm/Hg or within value directed by their physician.   Intervention Provide nutrition & aerobic exercise along with  prescribed medications to achieve BP 140/90 or less.   Lipids Yes   Goal Cholesterol controlled with medications as prescribed, with individualized exercise RX and with personalized nutrition plan. Value goals: LDL < 70mg , HDL > 40mg . Participant states understanding of desired cholesterol values and following prescriptions.   Intervention Provide nutrition & aerobic exercise along with prescribed medications to achieve LDL 70mg , HDL >40mg .   Stress Yes   Goal To meet with psychosocial counselor for stress and relaxation information and guidance. To state understanding of performing relaxation techniques and or identifying personal stressors.   Intervention Provide education on types of stress, identifiying stressors, and ways to cope with stress. Provide demonstration and active practice of relaxation techniques.      Tobacco Use Initial Evaluation: History  Smoking status  . Never Smoker   Smokeless tobacco  . Never Used    Copy of goals given to participant.

## 2015-05-14 NOTE — Progress Notes (Signed)
Cardiac Individual Treatment Plan  Patient Details  Name: Claire Mcdonald MRN: [id number] Date of Birth: September 28, 1965 Referring Marget Outten:  Deboraha Sprang, MD  Initial Encounter Date: Date: 05/14/15  Visit Diagnosis: Acute non-ST-elevation myocardial infarction  Patient's Home Medications on Admission:  Current outpatient prescriptions:  .  albuterol (PROVENTIL HFA;VENTOLIN HFA) 108 (90 BASE) MCG/ACT inhaler, Inhale 2 puffs into the lungs every 6 (six) hours as needed for wheezing or shortness of breath., Disp: 1 Inhaler, Rfl: 0 .  apixaban (ELIQUIS) 5 MG TABS tablet, Take 1 tablet (5 mg total) by mouth 2 (two) times daily. Take two 5 mg tablets twice  a day for the first week., Disp: 60 tablet, Rfl: 3 .  atorvastatin (LIPITOR) 20 MG tablet, Take 1 tablet (20 mg total) by mouth daily., Disp: 30 tablet, Rfl: 1 .  DULoxetine (CYMBALTA) 60 MG capsule, Take 60 mg by mouth daily., Disp: , Rfl:  .  fluticasone (FLONASE) 50 MCG/ACT nasal spray, Place 1 spray into both nostrils daily., Disp: 16 g, Rfl: 0 .  HYDROcodone-acetaminophen (NORCO/VICODIN) 5-325 MG per tablet, Take 1 tablet by mouth every 6 (six) hours as needed for moderate pain., Disp: 40 tablet, Rfl: 0 .  hydrocortisone cream 0.5 %, Apply 1 application topically as needed for itching (for redness at ICD site)., Disp: 30 g, Rfl: 0 .  levETIRAcetam (KEPPRA) 500 MG tablet, Take 1 tablet (500 mg total) by mouth 2 (two) times daily., Disp: 60 tablet, Rfl: 2 .  metoprolol tartrate (LOPRESSOR) 25 MG tablet, Take 0.5 tablets (12.5 mg total) by mouth 2 (two) times daily., Disp: 60 tablet, Rfl: 1 .  topiramate (TOPAMAX) 100 MG tablet, Take 1 tablet (100 mg total) by mouth 2 (two) times daily., Disp: 60 tablet, Rfl: 1 .  acetaminophen (TYLENOL) 500 MG tablet, Take 2 tablets (1,000 mg total) by mouth 3 (three) times daily. For ONE WEEK (Patient not taking: Reported on 05/14/2015), Disp: 30 tablet, Rfl: 0 .  omeprazole (PRILOSEC) 20 MG capsule, Take 20 mg by  mouth daily., Disp: , Rfl:   Past Medical History: Past Medical History  Diagnosis Date  . Hypertension   . High cholesterol   . Anxiety   . Depression   . Allergy   . GERD (gastroesophageal reflux disease)   . Migraine   . Diverticulosis     seen on CT 2015  . Obesity (BMI 30-39.9)   . DVT (deep venous thrombosis)   . 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 fib, EF nl, non-obs CAD; [employer] ICD    Tobacco Use: History  Smoking status  . Never Smoker   Smokeless tobacco  . Never Used    Labs: Recent Review Flowsheet Data    Labs for ITP Cardiac and Pulmonary Rehab Latest Ref Rng 03/25/2015 03/25/2015 03/25/2015 03/26/2015 03/28/2015   Trlycerides <150 mg/dL - 133 - - 93   PHART 7.350 - 7.450 7.31(L) - 7.356 7.346(L) -   PCO2ART 35.0 - 45.0 mmHg 40 - 35.7 34.8(L) -   HCO3 20.0 - 24.0 mEq/L 20.1(L) - 19.3(L) 19.1(L) -   TCO2 0 - 100 mmol/L - - 20.4 20 -   ACIDBASEDEF 0.0 - 2.0 mmol/L 5.8(H) - 5.1(H) 6.0(H) -   O2SAT - 94.9 - 99.3 99.0 -       Exercise Target Goals: Date: 05/14/15  Exercise Program Goal: Individual exercise prescription set with THRR, safety & activity barriers. Participant demonstrates ability to understand and report RPE using BORG scale, to  self-measure pulse accurately, and to acknowledge the importance of the exercise prescription.  Exercise Prescription Goal: Starting with aerobic activity 30 plus minutes a day, 3 days per week for initial exercise prescription. Provide home exercise prescription and guidelines that participant acknowledges understanding prior to discharge.  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 Has received injections in lower back for symptoms. Last time was June 2016.   Risk  Stratification High      6 Minute Walk:     6 Minute Walk      05/14/15 1613       6 Minute Walk   Phase Initial     Distance 500 feet     Walk Time 3 minutes     Resting HR 68 bpm     Resting BP 104/76 mmHg     Max Ex. HR 98 bpm     Max Ex. BP 102/60 mmHg     RPE 9     Symptoms Yes (comment)     Comments No symptoms of pain. Had to stop due to weakness in the legs.         Initial Exercise Prescription:     Initial Exercise Prescription - 05/14/15 1600    Date of Initial Exercise Prescription   Date 05/14/15   Bike   Level 0.5   Minutes 15  5 min x 3 with rest in between if needed   Recumbant Bike   Level 1   Watts 20   Minutes 15  5 min x 3 with rest in between if needed   NuStep   Level 2   Watts 20   Minutes 15  5 min x 3 with rest in between if needed   Arm Ergometer   Level 1   Watts 8   Minutes 15  5 min x 3 with rest in between if needed   Arm/Foot Ergometer   Level 1   Watts 10   Minutes 15  5 min x 3 with rest in between if needed   Cybex   Level 1   RPM 30   Minutes 15  5 min x 3 with rest in between if needed   Recumbant Elliptical   Level 1   Watts 20   Minutes 15  5 min x 3 with rest in between if needed   REL-XR   Level 1   Watts 20   Minutes 15  5 min x 3 with rest in between if needed   Prescription Details   Frequency (times per week) 3   Duration Progress to 30 minutes of continuous aerobic without signs/symptoms of physical distress   Intensity   THRR REST +  20   Ratings of Perceived Exertion 11-13   Progression Continue progressive overload as per policy without signs/symptoms or physical distress.   Resistance Training   Training Prescription Yes   Weight 1   Reps 10-12      Exercise Prescription Changes:   Discharge Exercise Prescription (Final Exercise Prescription Changes):   Nutrition:  Target Goals: Understanding of nutrition guidelines, daily intake of sodium 1500mg , cholesterol 200mg , calories  30% from fat and 7% or less from saturated fats, daily to have 5 or more servings of fruits and vegetables.  Biometrics:  Pre Biometrics - 05/14/15 1621    Pre Biometrics   Height 5' 1.75" (1.568 m)   Weight 204 lb 12.8 oz (92.897 kg)   Waist Circumference 38.5 inches   Hip Circumference 48 inches   Waist to Hip Ratio 0.8 %   BMI (Calculated) 37.8       Nutrition Therapy Plan and Nutrition Goals:   Nutrition Discharge: Rate Your Plate Scores:   Nutrition Goals Re-Evaluation:   Psychosocial: Target Goals: Acknowledge presence or absence of depression, maximize coping skills, provide positive support system. Participant is able to verbalize types and ability to use techniques and skills needed for reducing stress and depression.  Initial Review & Psychosocial Screening:     Initial Psych Review & Screening - 05/14/15 1905    Initial Review   Current issues with Current Sleep Concerns;Current Stress Concerns   Source of Stress Concerns Financial;Transportation   Family Dynamics   Good Support System? Yes   Comments Children are helping Kanyah at this time   Barriers   Psychosocial barriers to participate in program There are no identifiable barriers or psychosocial needs.;The patient should benefit from training in stress management and relaxation.   Screening Interventions   Interventions Encouraged to exercise;Program counselor consult      Quality of Life Scores:     Quality of Life - 05/14/15 1852    Quality of Life Scores   Health/Function Pre 14.57 %   Socioeconomic Pre 23.14 %   Psych/Spiritual Pre 30 %   Family Pre 28.5 %   GLOBAL Pre 21.56 %      PHQ-9:     Recent Review Flowsheet Data    Depression screen [facility] 2/9 05/14/2015 02/01/2015   Decreased Interest 0 0   Down, Depressed, Hopeless 3 0   PHQ - 2 Score 3 0   Altered sleeping 3 -   Tired, decreased energy 3 -   Change in appetite 3 -   Feeling bad or failure about yourself  0 -   Trouble  concentrating 3 -   Moving slowly or fidgety/restless 3 -   Suicidal thoughts 0 -   PHQ-9 Score 18 -   Difficult doing work/chores Not difficult at all -      Psychosocial Evaluation and Intervention:   Psychosocial Re-Evaluation:   Vocational Rehabilitation: Provide vocational rehab assistance to qualifying candidates.   Vocational Rehab Evaluation & Intervention:     Vocational Rehab - 05/14/15 1440    Initial Vocational Rehab Evaluation & Intervention   Assessment shows need for Vocational Rehabilitation No      Education: Education Goals: Education classes will be provided on a weekly basis, covering required topics. Participant will state understanding/return demonstration of topics presented.  Learning Barriers/Preferences:     Learning Barriers/Preferences - 05/14/15 1439    Learning Barriers/Preferences   Learning Barriers Sight   Learning Preferences Group Instruction      Education Topics: General Nutrition Guidelines/Fats and Fiber: -Group instruction provided by verbal, written material, models and posters to present the general guidelines for heart healthy nutrition. Gives an explanation and review of dietary fats and fiber.   Controlling Sodium/Reading Food Labels: -Group verbal and written material supporting the discussion of sodium use in heart healthy nutrition. Review and explanation with models, verbal and written materials for utilization of the food label.   Exercise Physiology & Risk Factors: - Group verbal and written instruction with models to review the exercise physiology of the cardiovascular system and associated critical  values. Details cardiovascular disease risk factors and the goals associated with each risk factor.   Aerobic Exercise & Resistance Training: - Gives group verbal and written discussion on the health impact of inactivity. On the components of aerobic and resistive training programs and the benefits of this training  and how to safely progress through these programs.   Flexibility, Balance, General Exercise Guidelines: - Provides group verbal and written instruction on the benefits of flexibility and balance training programs. Provides general exercise guidelines with specific guidelines to those with heart or lung disease. Demonstration and skill practice provided.   Stress Management: - Provides group verbal and written instruction about the health risks of elevated stress, cause of high stress, and healthy ways to reduce stress.   Depression: - Provides group verbal and written instruction on the correlation between heart/lung disease and depressed mood, treatment options, and the stigmas associated with seeking treatment.   Anatomy & Physiology of the Heart: - Group verbal and written instruction and models provide basic cardiac anatomy and physiology, with the coronary electrical and arterial systems. Review of: AMI, Angina, Valve disease, Heart Failure, Cardiac Arrhythmia, Pacemakers, and the ICD.   Cardiac Procedures: - Group verbal and written instruction and models to describe the testing methods done to diagnose heart disease. Reviews the outcomes of the test results. Describes the treatment choices: Medical Management, Angioplasty, or Coronary Bypass Surgery.   Cardiac Medications: - Group verbal and written instruction to review commonly prescribed medications for heart disease. Reviews the medication, class of the drug, and side effects. Includes the steps to properly store meds and maintain the prescription regimen.   Go Sex-Intimacy & Heart Disease, Get SMART - Goal Setting: - Group verbal and written instruction through game format to discuss heart disease and the return to sexual intimacy. Provides group verbal and written material to discuss and apply goal setting through the application of the S.M.A.R.T. Method.   Other Matters of the Heart: - Provides group verbal, written  materials and models to describe Heart Failure, Angina, Valve Disease, and Diabetes in the realm of heart disease. Includes description of the disease process and treatment options available to the cardiac patient.   Exercise & Equipment Safety: - Individual verbal instruction and demonstration of equipment use and safety with use of the equipment.          Cardiac Rehab from 05/14/2015 in [facility] Cardiac Rehab   Date  05/14/15   Educator  Sb   Instruction Review Code  2- meets goals/outcomes      Infection Prevention: - Provides verbal and written material to individual with discussion of infection control including proper hand washing and proper equipment cleaning during exercise session.      Cardiac Rehab from 05/14/2015 in [facility] Cardiac Rehab   Date  05/14/15   Educator  SB   Instruction Review Code  2- meets goals/outcomes      Falls Prevention: - Provides verbal and written material to individual with discussion of falls prevention and safety.      Cardiac Rehab from 05/14/2015 in [facility] Cardiac Rehab   Date  05/14/15   Educator  SB   Instruction Review Code  2- meets goals/outcomes      Diabetes: - Individual verbal and written instruction to review signs/symptoms of diabetes, desired ranges of glucose level fasting, after meals and with exercise. Advice that pre and post exercise glucose checks will be done for 3 sessions at entry of program.    Knowledge Questionnaire  Score:     Knowledge Questionnaire Score - 05/14/15 1851    Knowledge Questionnaire Score   Pre Score 19/28      Personal Goals and Risk Factors at Admission:     Personal Goals and Risk Factors at Admission - 05/14/15 1442    Personal Goals and Risk Factors on Admission    Weight Management Yes;Obesity   Intervention Learn and follow the exercise and diet guidelines while in the program. Utilize the nutrition and education classes to help gain knowledge of the diet and exercise expectations in the  program   Intervention Provide weight management tools through evaluation completed by registered dietician and exercise physiologist.  Establish a goal weight with participant.   Admit Weight 204 lb (92.534 kg)   Goal Weight 150 lb (68.04 kg)   Increase Aerobic Exercise and Physical Activity Yes   Intervention While in program, learn and follow the exercise prescription taught. Start at a low level workload and increase workload after able to maintain previous level for 30 minutes. Increase time before increasing intensity.   Understand more about Heart/Pulmonary Disease. Yes   Intervention While in program utilize professionals for any questions, and attend the education sessions. Great websites to use are www.americanheart.org or www.lung.org for reliable information.   Diabetes No   Hypertension Yes   Goal Participant will see blood pressure controlled within the values of 140/32mm/Hg or within value directed by their physician.   Intervention Provide nutrition & aerobic exercise along with prescribed medications to achieve BP 140/90 or less.   Lipids Yes   Goal Cholesterol controlled with medications as prescribed, with individualized exercise RX and with personalized nutrition plan. Value goals: LDL < 70mg , HDL > 40mg . Participant states understanding of desired cholesterol values and following prescriptions.   Intervention Provide nutrition & aerobic exercise along with prescribed medications to achieve LDL 70mg , HDL >40mg .   Stress Yes   Goal To meet with psychosocial counselor for stress and relaxation information and guidance. To state understanding of performing relaxation techniques and or identifying personal stressors.   Intervention Provide education on types of stress, identifiying stressors, and ways to cope with stress. Provide demonstration and active practice of relaxation techniques.      Personal Goals and Risk Factors Review:    Personal Goals Discharge (Final Personal  Goals and Risk Factors Review):     Comments: Initial ITP Wil start sessions end of next week.

## 2015-05-16 ENCOUNTER — Telehealth: Payer: Self-pay | Admitting: *Deleted

## 2015-05-16 ENCOUNTER — Ambulatory Visit: Payer: Medicaid Other | Admitting: Family Medicine

## 2015-05-16 NOTE — Telephone Encounter (Signed)
Called Claire Mcdonald to follow up on her GI symptoms and also to see if she was having any trouble getting the transportation set up for her Cardiac Rehab sessions.  Left message on her voicemail.

## 2015-05-21 ENCOUNTER — Telehealth: Payer: Self-pay | Admitting: Internal Medicine

## 2015-05-21 NOTE — Telephone Encounter (Signed)
OK for me to do a letter?

## 2015-05-21 NOTE — Telephone Encounter (Signed)
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  He is in rehab for 36 weeks.  In order to get her food stamps and assistance, she needs a note from Dr Caryl Comes stating she cannot work.  Please include hospital dates and diagnosis.   Please fax to [phone] attn Sherri Malpass.

## 2015-05-24 NOTE — Telephone Encounter (Signed)
I called and spoke with the patient in regards to her request and Dr. Olin Pia reply. 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 She does require a lot of walking, standing, and some lifting. She is due to start Cardiac Rehab tomorrow. I inquired if she has attempted to obtain FMLA/ short term disability through [employer]. She states she was denied. She reports she is not getting enough money from them to feed all the members of her family. She need to get her food stamps and is unable to drive for 6 months and getting back and forth to [place] will be difficult for her. I advised her I would forward this information on to Dr. Caryl Comes and call her back with an update. She inquired if he will not do a letter, then when does Dr. Caryl Comes recommend she could go back to work without restrictions 3 days a week. She states she last worked on 03/25/15 and her device was implanted on 04/11/15.

## 2015-05-24 NOTE — Telephone Encounter (Signed)
i am not sure,  She had an ICD in august,  i am not sure there is an EP reason not to work or ?"rehab"

## 2015-05-25 ENCOUNTER — Ambulatory Visit: Payer: Medicaid Other

## 2015-05-28 ENCOUNTER — Ambulatory Visit: Payer: Medicaid Other

## 2015-05-29 ENCOUNTER — Encounter: Payer: Self-pay | Admitting: *Deleted

## 2015-05-29 ENCOUNTER — Encounter: Payer: Self-pay | Admitting: Internal Medicine

## 2015-05-29 DIAGNOSIS — I214 Non-ST elevation (NSTEMI) myocardial infarction: Secondary | ICD-10-CM

## 2015-05-30 ENCOUNTER — Telehealth: Payer: Self-pay | Admitting: *Deleted

## 2015-05-30 ENCOUNTER — Ambulatory Visit: Payer: Medicaid Other

## 2015-05-30 NOTE — Telephone Encounter (Signed)
Claire Mcdonald left a vm she is still sick and hopes to return Friday am.

## 2015-05-30 NOTE — Progress Notes (Signed)
Cardiac Individual Treatment Plan  Patient Details  Name: Claire Mcdonald MRN: [id number] Date of Birth: 28-Sep-1965 Referring Provider:  No ref. provider found  Initial Encounter Date:    Visit Diagnosis: Acute non-ST-elevation myocardial infarction [facility])  Patient's Home Medications on Admission:  Current outpatient prescriptions:  .  acetaminophen (TYLENOL) 500 MG tablet, Take 2 tablets (1,000 mg total) by mouth 3 (three) times daily. For ONE WEEK (Patient not taking: Reported on 05/14/2015), Disp: 30 tablet, Rfl: 0 .  albuterol (PROVENTIL HFA;VENTOLIN HFA) 108 (90 BASE) MCG/ACT inhaler, Inhale 2 puffs into the lungs every 6 (six) hours as needed for wheezing or shortness of breath., Disp: 1 Inhaler, Rfl: 0 .  apixaban (ELIQUIS) 5 MG TABS tablet, Take 1 tablet (5 mg total) by mouth 2 (two) times daily. Take two 5 mg tablets twice  a day for the first week., Disp: 60 tablet, Rfl: 3 .  atorvastatin (LIPITOR) 20 MG tablet, Take 1 tablet (20 mg total) by mouth daily., Disp: 30 tablet, Rfl: 1 .  DULoxetine (CYMBALTA) 60 MG capsule, Take 60 mg by mouth daily., Disp: , Rfl:  .  fluticasone (FLONASE) 50 MCG/ACT nasal spray, Place 1 spray into both nostrils daily., Disp: 16 g, Rfl: 0 .  HYDROcodone-acetaminophen (NORCO/VICODIN) 5-325 MG per tablet, Take 1 tablet by mouth every 6 (six) hours as needed for moderate pain., Disp: 40 tablet, Rfl: 0 .  hydrocortisone cream 0.5 %, Apply 1 application topically as needed for itching (for redness at ICD site)., Disp: 30 g, Rfl: 0 .  levETIRAcetam (KEPPRA) 500 MG tablet, Take 1 tablet (500 mg total) by mouth 2 (two) times daily., Disp: 60 tablet, Rfl: 2 .  metoprolol tartrate (LOPRESSOR) 25 MG tablet, Take 0.5 tablets (12.5 mg total) by mouth 2 (two) times daily., Disp: 60 tablet, Rfl: 1 .  omeprazole (PRILOSEC) 20 MG capsule, Take 20 mg by mouth daily., Disp: , Rfl:  .  topiramate (TOPAMAX) 100 MG tablet, Take 1 tablet (100 mg total) by mouth 2 (two) times daily.,  Disp: 60 tablet, Rfl: 1  Past Medical History: Past Medical History  Diagnosis Date  . Hypertension   . High cholesterol   . Anxiety   . Depression   . Allergy   . GERD (gastroesophageal reflux disease)   . Migraine   . Diverticulosis     seen on CT 2015  . Obesity (BMI 30-39.9)   . DVT (deep venous thrombosis)   . 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 fib, EF nl, non-obs CAD; [employer] ICD    Tobacco Use: History  Smoking status  . Never Smoker   Smokeless tobacco  . Never Used    Labs: Recent Review Flowsheet Data    Labs for ITP Cardiac and Pulmonary Rehab Latest Ref Rng 03/25/2015 03/25/2015 03/25/2015 03/26/2015 03/28/2015   Trlycerides <150 mg/dL - 133 - - 93   PHART 7.350 - 7.450 7.31(L) - 7.356 7.346(L) -   PCO2ART 35.0 - 45.0 mmHg 40 - 35.7 34.8(L) -   HCO3 20.0 - 24.0 mEq/L 20.1(L) - 19.3(L) 19.1(L) -   TCO2 0 - 100 mmol/L - - 20.4 20 -   ACIDBASEDEF 0.0 - 2.0 mmol/L 5.8(H) - 5.1(H) 6.0(H) -   O2SAT - 94.9 - 99.3 99.0 -       Exercise Target Goals:    Exercise Program Goal: Individual exercise prescription set with THRR, safety & activity barriers. Participant demonstrates ability to understand and report RPE using BORG scale,  to self-measure pulse accurately, and to acknowledge the importance of the exercise prescription.  Exercise Prescription Goal: Starting with aerobic activity 30 plus minutes a day, 3 days per week for initial exercise prescription. Provide home exercise prescription and guidelines that participant acknowledges understanding prior to discharge.  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 Has received injections in lower back for symptoms. Last time was June 2016.   Risk Stratification  High      6 Minute Walk:     6 Minute Walk      05/14/15 1613       6 Minute Walk   Phase Initial     Distance 500 feet     Walk Time 3 minutes     Resting HR 68 bpm     Resting BP 104/76 mmHg     Max Ex. HR 98 bpm     Max Ex. BP 102/60 mmHg     RPE 9     Symptoms Yes (comment)     Comments No symptoms of pain. Had to stop due to weakness in the legs.         Initial Exercise Prescription:     Initial Exercise Prescription - 05/14/15 1600    Date of Initial Exercise Prescription   Date 05/14/15   Bike   Level 0.5   Minutes 15  5 min x 3 with rest in between if needed   Recumbant Bike   Level 1   Watts 20   Minutes 15  5 min x 3 with rest in between if needed   NuStep   Level 2   Watts 20   Minutes 15  5 min x 3 with rest in between if needed   Arm Ergometer   Level 1   Watts 8   Minutes 15  5 min x 3 with rest in between if needed   Arm/Foot Ergometer   Level 1   Watts 10   Minutes 15  5 min x 3 with rest in between if needed   Cybex   Level 1   RPM 30   Minutes 15  5 min x 3 with rest in between if needed   Recumbant Elliptical   Level 1   Watts 20   Minutes 15  5 min x 3 with rest in between if needed   REL-XR   Level 1   Watts 20   Minutes 15  5 min x 3 with rest in between if needed   Prescription Details   Frequency (times per week) 3   Duration Progress to 30 minutes of continuous aerobic without signs/symptoms of physical distress   Intensity   THRR REST +  20   Ratings of Perceived Exertion 11-13   Progression Continue progressive overload as per policy without signs/symptoms or physical distress.   Resistance Training   Training Prescription Yes   Weight 1   Reps 10-12      Exercise Prescription Changes:     Exercise Prescription Changes      05/29/15 0600           Exercise Review   Progression No  Has not started exercise; medical review on 05/14/15          Discharge Exercise Prescription (  Final Exercise  Prescription Changes):     Exercise Prescription Changes - 05/29/15 0600    Exercise Review   Progression No  Has not started exercise; medical review on 05/14/15      Nutrition:  Target Goals: Understanding of nutrition guidelines, daily intake of sodium 1500mg , cholesterol 200mg , calories 30% from fat and 7% or less from saturated fats, daily to have 5 or more servings of fruits and vegetables.  Biometrics:     Pre Biometrics - 05/14/15 1621    Pre Biometrics   Height 5' 1.75" (1.568 m)   Weight 204 lb 12.8 oz (92.897 kg)   Waist Circumference 38.5 inches   Hip Circumference 48 inches   Waist to Hip Ratio 0.8 %   BMI (Calculated) 37.8       Nutrition Therapy Plan and Nutrition Goals:   Nutrition Discharge: Rate Your Plate Scores:   Nutrition Goals Re-Evaluation:   Psychosocial: Target Goals: Acknowledge presence or absence of depression, maximize coping skills, provide positive support system. Participant is able to verbalize types and ability to use techniques and skills needed for reducing stress and depression.  Initial Review & Psychosocial Screening:     Initial Psych Review & Screening - 05/14/15 1905    Initial Review   Current issues with Current Sleep Concerns;Current Stress Concerns   Source of Stress Concerns Financial;Transportation   Family Dynamics   Good Support System? Yes   Comments Children are helping [place] at this time   Barriers   Psychosocial barriers to participate in program There are no identifiable barriers or psychosocial needs.;The patient should benefit from training in stress management and relaxation.   Screening Interventions   Interventions Encouraged to exercise;Program counselor consult      Quality of Life Scores:     Quality of Life - 05/14/15 1852    Quality of Life Scores   Health/Function Pre 14.57 %   Socioeconomic Pre 23.14 %   Psych/Spiritual Pre 30 %   Family Pre 28.5 %   GLOBAL Pre 21.56 %       PHQ-9:     Recent Review Flowsheet Data    Depression screen [facility] 2/9 05/14/2015 02/01/2015   Decreased Interest 0 0   Down, Depressed, Hopeless 3 0   PHQ - 2 Score 3 0   Altered sleeping 3 -   Tired, decreased energy 3 -   Change in appetite 3 -   Feeling bad or failure about yourself  0 -   Trouble concentrating 3 -   Moving slowly or fidgety/restless 3 -   Suicidal thoughts 0 -   PHQ-9 Score 18 -   Difficult doing work/chores Not difficult at all -      Psychosocial Evaluation and Intervention:   Psychosocial Re-Evaluation:     Psychosocial Re-Evaluation      05/30/15 1845           Psychosocial Re-Evaluation   Interventions Encouraged to attend Cardiac Rehabilitation for the exercise;Stress management education       Comments Casandra has the stressor of just being sick so unable to attend Cardiac Rehab the past 2 days.           Vocational Rehabilitation: Provide vocational rehab assistance to qualifying candidates.   Vocational Rehab Evaluation & Intervention:     Vocational Rehab - 05/14/15 1440    Initial Vocational Rehab Evaluation & Intervention   Assessment shows need for Vocational Rehabilitation No      Education: Education Goals:  Education classes will be provided on a weekly basis, covering required topics. Participant will state understanding/return demonstration of topics presented.  Learning Barriers/Preferences:     Learning Barriers/Preferences - 05/14/15 1439    Learning Barriers/Preferences   Learning Barriers Sight   Learning Preferences Group Instruction      Education Topics: General Nutrition Guidelines/Fats and Fiber: -Group instruction provided by verbal, written material, models and posters to present the general guidelines for heart healthy nutrition. Gives an explanation and review of dietary fats and fiber.   Controlling Sodium/Reading Food Labels: -Group verbal and written material supporting the discussion of sodium  use in heart healthy nutrition. Review and explanation with models, verbal and written materials for utilization of the food label.   Exercise Physiology & Risk Factors: - Group verbal and written instruction with models to review the exercise physiology of the cardiovascular system and associated critical values. Details cardiovascular disease risk factors and the goals associated with each risk factor.   Aerobic Exercise & Resistance Training: - Gives group verbal and written discussion on the health impact of inactivity. On the components of aerobic and resistive training programs and the benefits of this training and how to safely progress through these programs.   Flexibility, Balance, General Exercise Guidelines: - Provides group verbal and written instruction on the benefits of flexibility and balance training programs. Provides general exercise guidelines with specific guidelines to those with heart or lung disease. Demonstration and skill practice provided.   Stress Management: - Provides group verbal and written instruction about the health risks of elevated stress, cause of high stress, and healthy ways to reduce stress.   Depression: - Provides group verbal and written instruction on the correlation between heart/lung disease and depressed mood, treatment options, and the stigmas associated with seeking treatment.   Anatomy & Physiology of the Heart: - Group verbal and written instruction and models provide basic cardiac anatomy and physiology, with the coronary electrical and arterial systems. Review of: AMI, Angina, Valve disease, Heart Failure, Cardiac Arrhythmia, Pacemakers, and the ICD.   Cardiac Procedures: - Group verbal and written instruction and models to describe the testing methods done to diagnose heart disease. Reviews the outcomes of the test results. Describes the treatment choices: Medical Management, Angioplasty, or Coronary Bypass Surgery.   Cardiac  Medications: - Group verbal and written instruction to review commonly prescribed medications for heart disease. Reviews the medication, class of the drug, and side effects. Includes the steps to properly store meds and maintain the prescription regimen.   Go Sex-Intimacy & Heart Disease, Get SMART - Goal Setting: - Group verbal and written instruction through game format to discuss heart disease and the return to sexual intimacy. Provides group verbal and written material to discuss and apply goal setting through the application of the S.M.A.R.T. Method.   Other Matters of the Heart: - Provides group verbal, written materials and models to describe Heart Failure, Angina, Valve Disease, and Diabetes in the realm of heart disease. Includes description of the disease process and treatment options available to the cardiac patient.   Exercise & Equipment Safety: - Individual verbal instruction and demonstration of equipment use and safety with use of the equipment.          Cardiac Rehab from 05/14/2015 in [facility] Cardiac Rehab   Date  05/14/15   Educator  Sb   Instruction Review Code  2- meets goals/outcomes      Infection Prevention: - Provides verbal and written material to individual with  discussion of infection control including proper hand washing and proper equipment cleaning during exercise session.      Cardiac Rehab from 05/14/2015 in [facility] Cardiac Rehab   Date  05/14/15   Educator  SB   Instruction Review Code  2- meets goals/outcomes      Falls Prevention: - Provides verbal and written material to individual with discussion of falls prevention and safety.      Cardiac Rehab from 05/14/2015 in [facility] Cardiac Rehab   Date  05/14/15   Educator  SB   Instruction Review Code  2- meets goals/outcomes      Diabetes: - Individual verbal and written instruction to review signs/symptoms of diabetes, desired ranges of glucose level fasting, after meals and with exercise. Advice that pre  and post exercise glucose checks will be done for 3 sessions at entry of program.    Knowledge Questionnaire Score:     Knowledge Questionnaire Score - 05/14/15 1851    Knowledge Questionnaire Score   Pre Score 19/28      Personal Goals and Risk Factors at Admission:     Personal Goals and Risk Factors at Admission - 05/14/15 1442    Personal Goals and Risk Factors on Admission    Weight Management Yes;Obesity   Intervention Learn and follow the exercise and diet guidelines while in the program. Utilize the nutrition and education classes to help gain knowledge of the diet and exercise expectations in the program   Intervention Provide weight management tools through evaluation completed by registered dietician and exercise physiologist.  Establish a goal weight with participant.   Admit Weight 204 lb (92.534 kg)   Goal Weight 150 lb (68.04 kg)   Increase Aerobic Exercise and Physical Activity Yes   Intervention While in program, learn and follow the exercise prescription taught. Start at a low level workload and increase workload after able to maintain previous level for 30 minutes. Increase time before increasing intensity.   Understand more about Heart/Pulmonary Disease. Yes   Intervention While in program utilize professionals for any questions, and attend the education sessions. Great websites to use are www.americanheart.org or www.lung.org for reliable information.   Diabetes No   Hypertension Yes   Goal Participant will see blood pressure controlled within the values of 140/64mm/Hg or within value directed by their physician.   Intervention Provide nutrition & aerobic exercise along with prescribed medications to achieve BP 140/90 or less.   Lipids Yes   Goal Cholesterol controlled with medications as prescribed, with individualized exercise RX and with personalized nutrition plan. Value goals: LDL < 70mg , HDL > 40mg . Participant states understanding of desired cholesterol  values and following prescriptions.   Intervention Provide nutrition & aerobic exercise along with prescribed medications to achieve LDL 70mg , HDL >40mg .   Stress Yes   Goal To meet with psychosocial counselor for stress and relaxation information and guidance. To state understanding of performing relaxation techniques and or identifying personal stressors.   Intervention Provide education on types of stress, identifiying stressors, and ways to cope with stress. Provide demonstration and active practice of relaxation techniques.      Personal Goals and Risk Factors Review:      Goals and Risk Factor Review      05/30/15 1845           Increase Aerobic Exercise and Physical Activity   Goals Progress/Improvement seen  No       Comments Unable to attend the past 2 days since she has  been out sick.           Personal Goals Discharge (Final Personal Goals and Risk Factors Review):      Goals and Risk Factor Review - 05/30/15 1845    Increase Aerobic Exercise and Physical Activity   Goals Progress/Improvement seen  No   Comments Unable to attend the past 2 days since she has been out sick.        Comments: Tarri has been unable to attend since sick the past 2 Cardiac Rehab days.

## 2015-05-30 NOTE — Telephone Encounter (Signed)
I left a message for the patient to call. 

## 2015-05-30 NOTE — Telephone Encounter (Signed)
There is no specific contraindication to work. The only Restriction would be driving by [facility] law is prohibitive for 6 months following her cardiac arrest.

## 2015-05-31 NOTE — Telephone Encounter (Signed)
F/u  Pt returning [employer] phone call.

## 2015-05-31 NOTE — Telephone Encounter (Signed)
PT WISHES  FOR MESSAGE  TO BE  SENT    TO [facility] WILL FAX   THIS NOTE .Claire Mcdonald

## 2015-05-31 NOTE — Telephone Encounter (Signed)
PT AWARE  OF  RESPONSE  WAS  HARD TO  UNDERSTAND PT  BAD  PHONE CONNECTION./CY

## 2015-06-01 ENCOUNTER — Ambulatory Visit: Payer: Medicaid Other

## 2015-06-01 NOTE — Telephone Encounter (Signed)
Reviewed with Devra Dopp and she reports she faxed this note to the fax # below yesterday.

## 2015-06-04 ENCOUNTER — Ambulatory Visit: Payer: Medicaid Other

## 2015-06-06 ENCOUNTER — Ambulatory Visit: Payer: Medicaid Other

## 2015-06-06 ENCOUNTER — Encounter: Payer: Self-pay | Admitting: *Deleted

## 2015-06-06 DIAGNOSIS — I214 Non-ST elevation (NSTEMI) myocardial infarction: Secondary | ICD-10-CM

## 2015-06-06 NOTE — Progress Notes (Signed)
Cardiac Individual Treatment Plan  Patient Details  Name: Claire Mcdonald MRN: [id number] Date of Birth: 11/02/1965 Referring Provider:  No ref. provider found  Initial Encounter Date:    Visit Diagnosis: Acute non-ST-elevation myocardial infarction [facility])  Patient's Home Medications on Admission:  Current outpatient prescriptions:  .  acetaminophen (TYLENOL) 500 MG tablet, Take 2 tablets (1,000 mg total) by mouth 3 (three) times daily. For ONE WEEK (Patient not taking: Reported on 05/14/2015), Disp: 30 tablet, Rfl: 0 .  albuterol (PROVENTIL HFA;VENTOLIN HFA) 108 (90 BASE) MCG/ACT inhaler, Inhale 2 puffs into the lungs every 6 (six) hours as needed for wheezing or shortness of breath., Disp: 1 Inhaler, Rfl: 0 .  apixaban (ELIQUIS) 5 MG TABS tablet, Take 1 tablet (5 mg total) by mouth 2 (two) times daily. Take two 5 mg tablets twice  a day for the first week., Disp: 60 tablet, Rfl: 3 .  atorvastatin (LIPITOR) 20 MG tablet, Take 1 tablet (20 mg total) by mouth daily., Disp: 30 tablet, Rfl: 1 .  DULoxetine (CYMBALTA) 60 MG capsule, Take 60 mg by mouth daily., Disp: , Rfl:  .  fluticasone (FLONASE) 50 MCG/ACT nasal spray, Place 1 spray into both nostrils daily., Disp: 16 g, Rfl: 0 .  HYDROcodone-acetaminophen (NORCO/VICODIN) 5-325 MG per tablet, Take 1 tablet by mouth every 6 (six) hours as needed for moderate pain., Disp: 40 tablet, Rfl: 0 .  hydrocortisone cream 0.5 %, Apply 1 application topically as needed for itching (for redness at ICD site)., Disp: 30 g, Rfl: 0 .  levETIRAcetam (KEPPRA) 500 MG tablet, Take 1 tablet (500 mg total) by mouth 2 (two) times daily., Disp: 60 tablet, Rfl: 2 .  metoprolol tartrate (LOPRESSOR) 25 MG tablet, Take 0.5 tablets (12.5 mg total) by mouth 2 (two) times daily., Disp: 60 tablet, Rfl: 1 .  omeprazole (PRILOSEC) 20 MG capsule, Take 20 mg by mouth daily., Disp: , Rfl:  .  topiramate (TOPAMAX) 100 MG tablet, Take 1 tablet (100 mg total) by mouth 2 (two) times daily.,  Disp: 60 tablet, Rfl: 1  Past Medical History: Past Medical History  Diagnosis Date  . Hypertension   . High cholesterol   . Anxiety   . Depression   . Allergy   . GERD (gastroesophageal reflux disease)   . Migraine   . Diverticulosis     seen on CT 2015  . Obesity (BMI 30-39.9)   . DVT (deep venous thrombosis)   . 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 fib, EF nl, non-obs CAD; [employer] ICD    Tobacco Use: History  Smoking status  . Never Smoker   Smokeless tobacco  . Never Used    Labs: Recent Review Flowsheet Data    Labs for ITP Cardiac and Pulmonary Rehab Latest Ref Rng 03/25/2015 03/25/2015 03/25/2015 03/26/2015 03/28/2015   Trlycerides <150 mg/dL - 133 - - 93   PHART 7.350 - 7.450 7.31(L) - 7.356 7.346(L) -   PCO2ART 35.0 - 45.0 mmHg 40 - 35.7 34.8(L) -   HCO3 20.0 - 24.0 mEq/L 20.1(L) - 19.3(L) 19.1(L) -   TCO2 0 - 100 mmol/L - - 20.4 20 -   ACIDBASEDEF 0.0 - 2.0 mmol/L 5.8(H) - 5.1(H) 6.0(H) -   O2SAT - 94.9 - 99.3 99.0 -       Exercise Target Goals:    Exercise Program Goal: Individual exercise prescription set with THRR, safety & activity barriers. Participant demonstrates ability to understand and report RPE using BORG scale,  to self-measure pulse accurately, and to acknowledge the importance of the exercise prescription.  Exercise Prescription Goal: Starting with aerobic activity 30 plus minutes a day, 3 days per week for initial exercise prescription. Provide home exercise prescription and guidelines that participant acknowledges understanding prior to discharge.  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 Has received injections in lower back for symptoms. Last time was June 2016.   Risk Stratification  High      6 Minute Walk:     6 Minute Walk      05/14/15 1613       6 Minute Walk   Phase Initial     Distance 500 feet     Walk Time 3 minutes     Resting HR 68 bpm     Resting BP 104/76 mmHg     Max Ex. HR 98 bpm     Max Ex. BP 102/60 mmHg     RPE 9     Symptoms Yes (comment)     Comments No symptoms of pain. Had to stop due to weakness in the legs.         Initial Exercise Prescription:     Initial Exercise Prescription - 05/14/15 1600    Date of Initial Exercise Prescription   Date 05/14/15   Bike   Level 0.5   Minutes 15  5 min x 3 with rest in between if needed   Recumbant Bike   Level 1   Watts 20   Minutes 15  5 min x 3 with rest in between if needed   NuStep   Level 2   Watts 20   Minutes 15  5 min x 3 with rest in between if needed   Arm Ergometer   Level 1   Watts 8   Minutes 15  5 min x 3 with rest in between if needed   Arm/Foot Ergometer   Level 1   Watts 10   Minutes 15  5 min x 3 with rest in between if needed   Cybex   Level 1   RPM 30   Minutes 15  5 min x 3 with rest in between if needed   Recumbant Elliptical   Level 1   Watts 20   Minutes 15  5 min x 3 with rest in between if needed   REL-XR   Level 1   Watts 20   Minutes 15  5 min x 3 with rest in between if needed   Prescription Details   Frequency (times per week) 3   Duration Progress to 30 minutes of continuous aerobic without signs/symptoms of physical distress   Intensity   THRR REST +  20   Ratings of Perceived Exertion 11-13   Progression Continue progressive overload as per policy without signs/symptoms or physical distress.   Resistance Training   Training Prescription Yes   Weight 1   Reps 10-12      Exercise Prescription Changes:     Exercise Prescription Changes      05/29/15 0600           Exercise Review   Progression No  Has not started exercise; medical review on 05/14/15          Discharge Exercise Prescription (  Final Exercise  Prescription Changes):     Exercise Prescription Changes - 05/29/15 0600    Exercise Review   Progression No  Has not started exercise; medical review on 05/14/15      Nutrition:  Target Goals: Understanding of nutrition guidelines, daily intake of sodium 1500mg , cholesterol 200mg , calories 30% from fat and 7% or less from saturated fats, daily to have 5 or more servings of fruits and vegetables.  Biometrics:     Pre Biometrics - 05/14/15 1621    Pre Biometrics   Height 5' 1.75" (1.568 m)   Weight 204 lb 12.8 oz (92.897 kg)   Waist Circumference 38.5 inches   Hip Circumference 48 inches   Waist to Hip Ratio 0.8 %   BMI (Calculated) 37.8       Nutrition Therapy Plan and Nutrition Goals:   Nutrition Discharge: Rate Your Plate Scores:   Nutrition Goals Re-Evaluation:   Psychosocial: Target Goals: Acknowledge presence or absence of depression, maximize coping skills, provide positive support system. Participant is able to verbalize types and ability to use techniques and skills needed for reducing stress and depression.  Initial Review & Psychosocial Screening:     Initial Psych Review & Screening - 05/14/15 1905    Initial Review   Current issues with Current Sleep Concerns;Current Stress Concerns   Source of Stress Concerns Financial;Transportation   Family Dynamics   Good Support System? Yes   Comments Children are helping Jamilya at this time   Barriers   Psychosocial barriers to participate in program There are no identifiable barriers or psychosocial needs.;The patient should benefit from training in stress management and relaxation.   Screening Interventions   Interventions Encouraged to exercise;Program counselor consult      Quality of Life Scores:     Quality of Life - 05/14/15 1852    Quality of Life Scores   Health/Function Pre 14.57 %   Socioeconomic Pre 23.14 %   Psych/Spiritual Pre 30 %   Family Pre 28.5 %   GLOBAL Pre 21.56 %       PHQ-9:     Recent Review Flowsheet Data    Depression screen [facility] 2/9 05/14/2015 02/01/2015   Decreased Interest 0 0   Down, Depressed, Hopeless 3 0   PHQ - 2 Score 3 0   Altered sleeping 3 -   Tired, decreased energy 3 -   Change in appetite 3 -   Feeling bad or failure about yourself  0 -   Trouble concentrating 3 -   Moving slowly or fidgety/restless 3 -   Suicidal thoughts 0 -   PHQ-9 Score 18 -   Difficult doing work/chores Not difficult at all -      Psychosocial Evaluation and Intervention:   Psychosocial Re-Evaluation:     Psychosocial Re-Evaluation      05/30/15 1845 06/06/15 1154         Psychosocial Re-Evaluation   Interventions Encouraged to attend Cardiac Rehabilitation for the exercise;Stress management education Stress management education      Comments Aslan has the stressor of just being sick so unable to attend Cardiac Rehab the past 2 days.  Stress of being sick and missing still missing CR.          Vocational Rehabilitation: Provide vocational rehab assistance to qualifying candidates.   Vocational Rehab Evaluation & Intervention:     Vocational Rehab - 05/14/15 1440    Initial Vocational Rehab Evaluation & Intervention   Assessment shows need for  Vocational Rehabilitation No      Education: Education Goals: Education classes will be provided on a weekly basis, covering required topics. Participant will state understanding/return demonstration of topics presented.  Learning Barriers/Preferences:     Learning Barriers/Preferences - 05/14/15 1439    Learning Barriers/Preferences   Learning Barriers Sight   Learning Preferences Group Instruction      Education Topics: General Nutrition Guidelines/Fats and Fiber: -Group instruction provided by verbal, written material, models and posters to present the general guidelines for heart healthy nutrition. Gives an explanation and review of dietary fats and fiber.   Controlling  Sodium/Reading Food Labels: -Group verbal and written material supporting the discussion of sodium use in heart healthy nutrition. Review and explanation with models, verbal and written materials for utilization of the food label.   Exercise Physiology & Risk Factors: - Group verbal and written instruction with models to review the exercise physiology of the cardiovascular system and associated critical values. Details cardiovascular disease risk factors and the goals associated with each risk factor.   Aerobic Exercise & Resistance Training: - Gives group verbal and written discussion on the health impact of inactivity. On the components of aerobic and resistive training programs and the benefits of this training and how to safely progress through these programs.   Flexibility, Balance, General Exercise Guidelines: - Provides group verbal and written instruction on the benefits of flexibility and balance training programs. Provides general exercise guidelines with specific guidelines to those with heart or lung disease. Demonstration and skill practice provided.   Stress Management: - Provides group verbal and written instruction about the health risks of elevated stress, cause of high stress, and healthy ways to reduce stress.   Depression: - Provides group verbal and written instruction on the correlation between heart/lung disease and depressed mood, treatment options, and the stigmas associated with seeking treatment.   Anatomy & Physiology of the Heart: - Group verbal and written instruction and models provide basic cardiac anatomy and physiology, with the coronary electrical and arterial systems. Review of: AMI, Angina, Valve disease, Heart Failure, Cardiac Arrhythmia, Pacemakers, and the ICD.   Cardiac Procedures: - Group verbal and written instruction and models to describe the testing methods done to diagnose heart disease. Reviews the outcomes of the test results. Describes  the treatment choices: Medical Management, Angioplasty, or Coronary Bypass Surgery.   Cardiac Medications: - Group verbal and written instruction to review commonly prescribed medications for heart disease. Reviews the medication, class of the drug, and side effects. Includes the steps to properly store meds and maintain the prescription regimen.   Go Sex-Intimacy & Heart Disease, Get SMART - Goal Setting: - Group verbal and written instruction through game format to discuss heart disease and the return to sexual intimacy. Provides group verbal and written material to discuss and apply goal setting through the application of the S.M.A.R.T. Method.   Other Matters of the Heart: - Provides group verbal, written materials and models to describe Heart Failure, Angina, Valve Disease, and Diabetes in the realm of heart disease. Includes description of the disease process and treatment options available to the cardiac patient.   Exercise & Equipment Safety: - Individual verbal instruction and demonstration of equipment use and safety with use of the equipment.          Cardiac Rehab from 05/14/2015 in [facility] Cardiac Rehab   Date  05/14/15   Educator  Sb   Instruction Review Code  2- meets goals/outcomes  Infection Prevention: - Provides verbal and written material to individual with discussion of infection control including proper hand washing and proper equipment cleaning during exercise session.      Cardiac Rehab from 05/14/2015 in [facility] Cardiac Rehab   Date  05/14/15   Educator  SB   Instruction Review Code  2- meets goals/outcomes      Falls Prevention: - Provides verbal and written material to individual with discussion of falls prevention and safety.      Cardiac Rehab from 05/14/2015 in [facility] Cardiac Rehab   Date  05/14/15   Educator  SB   Instruction Review Code  2- meets goals/outcomes      Diabetes: - Individual verbal and written instruction to review signs/symptoms of  diabetes, desired ranges of glucose level fasting, after meals and with exercise. Advice that pre and post exercise glucose checks will be done for 3 sessions at entry of program.    Knowledge Questionnaire Score:     Knowledge Questionnaire Score - 05/14/15 1851    Knowledge Questionnaire Score   Pre Score 19/28      Personal Goals and Risk Factors at Admission:     Personal Goals and Risk Factors at Admission - 05/14/15 1442    Personal Goals and Risk Factors on Admission    Weight Management Yes;Obesity   Intervention Learn and follow the exercise and diet guidelines while in the program. Utilize the nutrition and education classes to help gain knowledge of the diet and exercise expectations in the program   Intervention Provide weight management tools through evaluation completed by registered dietician and exercise physiologist.  Establish a goal weight with participant.   Admit Weight 204 lb (92.534 kg)   Goal Weight 150 lb (68.04 kg)   Increase Aerobic Exercise and Physical Activity Yes   Intervention While in program, learn and follow the exercise prescription taught. Start at a low level workload and increase workload after able to maintain previous level for 30 minutes. Increase time before increasing intensity.   Understand more about Heart/Pulmonary Disease. Yes   Intervention While in program utilize professionals for any questions, and attend the education sessions. Great websites to use are www.americanheart.org or www.lung.org for reliable information.   Diabetes No   Hypertension Yes   Goal Participant will see blood pressure controlled within the values of 140/42mm/Hg or within value directed by their physician.   Intervention Provide nutrition & aerobic exercise along with prescribed medications to achieve BP 140/90 or less.   Lipids Yes   Goal Cholesterol controlled with medications as prescribed, with individualized exercise RX and with personalized nutrition  plan. Value goals: LDL < 70mg , HDL > 40mg . Participant states understanding of desired cholesterol values and following prescriptions.   Intervention Provide nutrition & aerobic exercise along with prescribed medications to achieve LDL 70mg , HDL >40mg .   Stress Yes   Goal To meet with psychosocial counselor for stress and relaxation information and guidance. To state understanding of performing relaxation techniques and or identifying personal stressors.   Intervention Provide education on types of stress, identifiying stressors, and ways to cope with stress. Provide demonstration and active practice of relaxation techniques.      Personal Goals and Risk Factors Review:      Goals and Risk Factor Review      05/30/15 1845           Increase Aerobic Exercise and Physical Activity   Goals Progress/Improvement seen  No  Comments Unable to attend the past 2 days since she has been out sick.           Personal Goals Discharge (Final Personal Goals and Risk Factors Review):      Goals and Risk Factor Review - 05/30/15 1845    Increase Aerobic Exercise and Physical Activity   Goals Progress/Improvement seen  No   Comments Unable to attend the past 2 days since she has been out sick.        Comments: Stress of being sick and missing still missing CR.

## 2015-06-08 ENCOUNTER — Other Ambulatory Visit: Payer: Self-pay

## 2015-06-08 ENCOUNTER — Ambulatory Visit: Payer: Medicaid Other

## 2015-06-08 MED ORDER — ATORVASTATIN CALCIUM 20 MG PO TABS: 20.0000 mg | ORAL_TABLET | Freq: Every day | ORAL | Status: DC

## 2015-06-08 NOTE — Telephone Encounter (Signed)
PATIENT: Claire Mcdonald DOB: 08/03/1966 PHARMACY: [place]  Patient requests atorvastatin calcium 20mg  tab # 30.

## 2015-06-11 ENCOUNTER — Ambulatory Visit: Payer: Medicaid Other

## 2015-06-12 ENCOUNTER — Telehealth: Payer: Self-pay | Admitting: Unknown Physician Specialty

## 2015-06-12 NOTE — Telephone Encounter (Signed)
Pt called stated pharmacy is stating they have not received the RX for Lipitor. Pharm is [facility] in [place]. Thanks.

## 2015-06-12 NOTE — Telephone Encounter (Signed)
Called and left the patient a message letting her know what happened (rx was printed instead of being sent straight to pharmacy). I told the patient that the rx should be at the pharmacy now. Asked for her to call back if she has any other questions or concerns.

## 2015-06-13 ENCOUNTER — Ambulatory Visit: Payer: Medicaid Other

## 2015-06-15 ENCOUNTER — Ambulatory Visit: Payer: Medicaid Other

## 2015-06-18 ENCOUNTER — Ambulatory Visit: Payer: Medicaid Other

## 2015-06-20 ENCOUNTER — Ambulatory Visit: Payer: Medicaid Other

## 2015-06-21 ENCOUNTER — Encounter: Payer: Self-pay | Admitting: *Deleted

## 2015-06-21 DIAGNOSIS — I214 Non-ST elevation (NSTEMI) myocardial infarction: Secondary | ICD-10-CM

## 2015-06-21 NOTE — Progress Notes (Signed)
Cardiac Individual Treatment Plan  Patient Details  Name: Claire Mcdonald MRN: [id number] Date of Birth: [birth year] Referring Provider:  No ref. provider found  Initial Encounter Date:  05/14/2015  Visit Diagnosis: Acute non-ST-elevation myocardial infarction [facility])  Patient's Home Medications on Admission:  Current outpatient prescriptions:  .  acetaminophen (TYLENOL) 500 MG tablet, Take 2 tablets (1,000 mg total) by mouth 3 (three) times daily. For ONE WEEK (Patient not taking: Reported on 05/14/2015), Disp: 30 tablet, Rfl: 0 .  albuterol (PROVENTIL HFA;VENTOLIN HFA) 108 (90 BASE) MCG/ACT inhaler, Inhale 2 puffs into the lungs every 6 (six) hours as needed for wheezing or shortness of breath., Disp: 1 Inhaler, Rfl: 0 .  apixaban (ELIQUIS) 5 MG TABS tablet, Take 1 tablet (5 mg total) by mouth 2 (two) times daily. Take two 5 mg tablets twice  a day for the first week., Disp: 60 tablet, Rfl: 3 .  atorvastatin (LIPITOR) 20 MG tablet, Take 1 tablet (20 mg total) by mouth daily., Disp: 30 tablet, Rfl: 1 .  DULoxetine (CYMBALTA) 60 MG capsule, Take 60 mg by mouth daily., Disp: , Rfl:  .  fluticasone (FLONASE) 50 MCG/ACT nasal spray, Place 1 spray into both nostrils daily., Disp: 16 g, Rfl: 0 .  HYDROcodone-acetaminophen (NORCO/VICODIN) 5-325 MG per tablet, Take 1 tablet by mouth every 6 (six) hours as needed for moderate pain., Disp: 40 tablet, Rfl: 0 .  hydrocortisone cream 0.5 %, Apply 1 application topically as needed for itching (for redness at ICD site)., Disp: 30 g, Rfl: 0 .  levETIRAcetam (KEPPRA) 500 MG tablet, Take 1 tablet (500 mg total) by mouth 2 (two) times daily., Disp: 60 tablet, Rfl: 2 .  metoprolol tartrate (LOPRESSOR) 25 MG tablet, Take 0.5 tablets (12.5 mg total) by mouth 2 (two) times daily., Disp: 60 tablet, Rfl: 1 .  omeprazole (PRILOSEC) 20 MG capsule, Take 20 mg by mouth daily., Disp: , Rfl:  .  topiramate (TOPAMAX) 100 MG tablet, Take 1 tablet (100 mg total) by mouth 2 (two) times  daily., Disp: 60 tablet, Rfl: 1  Past Medical History: Past Medical History  Diagnosis Date  . Hypertension   . High cholesterol   . Anxiety   . Depression   . Allergy   . GERD (gastroesophageal reflux disease)   . Migraine   . Diverticulosis     seen on CT 2015  . Obesity (BMI 30-39.9)   . DVT (deep venous thrombosis)   . 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 fib, EF nl, non-obs CAD; [employer] ICD    Tobacco Use: History  Smoking status  . Never Smoker   Smokeless tobacco  . Never Used    Labs: Recent Review Flowsheet Data    Labs for ITP Cardiac and Pulmonary Rehab Latest Ref Rng 03/25/2015 03/25/2015 03/25/2015 03/26/2015 03/28/2015   Trlycerides <150 mg/dL - 133 - - 93   PHART 7.350 - 7.450 7.31(L) - 7.356 7.346(L) -   PCO2ART 35.0 - 45.0 mmHg 40 - 35.7 34.8(L) -   HCO3 20.0 - 24.0 mEq/L 20.1(L) - 19.3(L) 19.1(L) -   TCO2 0 - 100 mmol/L - - 20.4 20 -   ACIDBASEDEF 0.0 - 2.0 mmol/L 5.8(H) - 5.1(H) 6.0(H) -   O2SAT - 94.9 - 99.3 99.0 -       Exercise Target Goals:    Exercise Program Goal: Individual exercise prescription set with THRR, safety & activity barriers. Participant demonstrates ability to understand and report RPE using BORG scale,  to self-measure pulse accurately, and to acknowledge the importance of the exercise prescription.  Exercise Prescription Goal: Starting with aerobic activity 30 plus minutes a day, 3 days per week for initial exercise prescription. Provide home exercise prescription and guidelines that participant acknowledges understanding prior to discharge.  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 Has received injections in lower back for symptoms. Last time was June 2016.   Risk  Stratification High      6 Minute Walk:     6 Minute Walk      05/14/15 1613       6 Minute Walk   Phase Initial     Distance 500 feet     Walk Time 3 minutes     Resting HR 68 bpm     Resting BP 104/76 mmHg     Max Ex. HR 98 bpm     Max Ex. BP 102/60 mmHg     RPE 9     Symptoms Yes (comment)     Comments No symptoms of pain. Had to stop due to weakness in the legs.         Initial Exercise Prescription:     Initial Exercise Prescription - 05/14/15 1600    Date of Initial Exercise Prescription   Date 05/14/15   Bike   Level 0.5   Minutes 15  5 min x 3 with rest in between if needed   Recumbant Bike   Level 1   Watts 20   Minutes 15  5 min x 3 with rest in between if needed   NuStep   Level 2   Watts 20   Minutes 15  5 min x 3 with rest in between if needed   Arm Ergometer   Level 1   Watts 8   Minutes 15  5 min x 3 with rest in between if needed   Arm/Foot Ergometer   Level 1   Watts 10   Minutes 15  5 min x 3 with rest in between if needed   Cybex   Level 1   RPM 30   Minutes 15  5 min x 3 with rest in between if needed   Recumbant Elliptical   Level 1   Watts 20   Minutes 15  5 min x 3 with rest in between if needed   REL-XR   Level 1   Watts 20   Minutes 15  5 min x 3 with rest in between if needed   Prescription Details   Frequency (times per week) 3   Duration Progress to 30 minutes of continuous aerobic without signs/symptoms of physical distress   Intensity   THRR REST +  20   Ratings of Perceived Exertion 11-13   Progression Continue progressive overload as per policy without signs/symptoms or physical distress.   Resistance Training   Training Prescription Yes   Weight 1   Reps 10-12      Exercise Prescription Changes:     Exercise Prescription Changes      05/29/15 0600           Exercise Review   Progression No  Has not started exercise; medical review on 05/14/15          Discharge Exercise Prescription  (  Final Exercise Prescription Changes):     Exercise Prescription Changes - 05/29/15 0600    Exercise Review   Progression No  Has not started exercise; medical review on 05/14/15      Nutrition:  Target Goals: Understanding of nutrition guidelines, daily intake of sodium 1500mg , cholesterol 200mg , calories 30% from fat and 7% or less from saturated fats, daily to have 5 or more servings of fruits and vegetables.  Biometrics:     Pre Biometrics - 05/14/15 1621    Pre Biometrics   Height 5' 1.75" (1.568 m)   Weight 204 lb 12.8 oz (92.897 kg)   Waist Circumference 38.5 inches   Hip Circumference 48 inches   Waist to Hip Ratio 0.8 %   BMI (Calculated) 37.8       Nutrition Therapy Plan and Nutrition Goals:   Nutrition Discharge: Rate Your Plate Scores:   Nutrition Goals Re-Evaluation:   Psychosocial: Target Goals: Acknowledge presence or absence of depression, maximize coping skills, provide positive support system. Participant is able to verbalize types and ability to use techniques and skills needed for reducing stress and depression.  Initial Review & Psychosocial Screening:     Initial Psych Review & Screening - 05/14/15 1905    Initial Review   Current issues with Current Sleep Concerns;Current Stress Concerns   Source of Stress Concerns Financial;Transportation   Family Dynamics   Good Support System? Yes   Comments Children are helping Claire Mcdonald at this time   Barriers   Psychosocial barriers to participate in program There are no identifiable barriers or psychosocial needs.;The patient should benefit from training in stress management and relaxation.   Screening Interventions   Interventions Encouraged to exercise;Program counselor consult      Quality of Life Scores:     Quality of Life - 05/14/15 1852    Quality of Life Scores   Health/Function Pre 14.57 %   Socioeconomic Pre 23.14 %   Psych/Spiritual Pre 30 %   Family Pre 28.5 %   GLOBAL Pre 21.56 %       PHQ-9:     Recent Review Flowsheet Data    Depression screen [facility] 2/9 05/14/2015 02/01/2015   Decreased Interest 0 0   Down, Depressed, Hopeless 3 0   PHQ - 2 Score 3 0   Altered sleeping 3 -   Tired, decreased energy 3 -   Change in appetite 3 -   Feeling bad or failure about yourself  0 -   Trouble concentrating 3 -   Moving slowly or fidgety/restless 3 -   Suicidal thoughts 0 -   PHQ-9 Score 18 -   Difficult doing work/chores Not difficult at all -      Psychosocial Evaluation and Intervention:   Psychosocial Re-Evaluation:     Psychosocial Re-Evaluation      05/30/15 1845 06/06/15 1154 06/21/15 1415       Psychosocial Re-Evaluation   Interventions Encouraged to attend Cardiac Rehabilitation for the exercise;Stress management education Stress management education Encouraged to attend Cardiac Rehabilitation for the exercise     Comments Claire Mcdonald has the stressor of just being sick so unable to attend Cardiac Rehab the past 2 days.  Stress of being sick and missing still missing CR.  Has been unable to attend Cardiac Rehab lately and I left her a vm.         Vocational Rehabilitation: Provide vocational rehab assistance to qualifying candidates.   Vocational Rehab Evaluation & Intervention:  Vocational Rehab - 05/14/15 1440    Initial Vocational Rehab Evaluation & Intervention   Assessment shows need for Vocational Rehabilitation No      Education: Education Goals: Education classes will be provided on a weekly basis, covering required topics. Participant will state understanding/return demonstration of topics presented.  Learning Barriers/Preferences:     Learning Barriers/Preferences - 05/14/15 1439    Learning Barriers/Preferences   Learning Barriers Sight   Learning Preferences Group Instruction      Education Topics: General Nutrition Guidelines/Fats and Fiber: -Group instruction provided by verbal, written material, models and posters to  present the general guidelines for heart healthy nutrition. Gives an explanation and review of dietary fats and fiber.   Controlling Sodium/Reading Food Labels: -Group verbal and written material supporting the discussion of sodium use in heart healthy nutrition. Review and explanation with models, verbal and written materials for utilization of the food label.   Exercise Physiology & Risk Factors: - Group verbal and written instruction with models to review the exercise physiology of the cardiovascular system and associated critical values. Details cardiovascular disease risk factors and the goals associated with each risk factor.   Aerobic Exercise & Resistance Training: - Gives group verbal and written discussion on the health impact of inactivity. On the components of aerobic and resistive training programs and the benefits of this training and how to safely progress through these programs.   Flexibility, Balance, General Exercise Guidelines: - Provides group verbal and written instruction on the benefits of flexibility and balance training programs. Provides general exercise guidelines with specific guidelines to those with heart or lung disease. Demonstration and skill practice provided.   Stress Management: - Provides group verbal and written instruction about the health risks of elevated stress, cause of high stress, and healthy ways to reduce stress.   Depression: - Provides group verbal and written instruction on the correlation between heart/lung disease and depressed mood, treatment options, and the stigmas associated with seeking treatment.   Anatomy & Physiology of the Heart: - Group verbal and written instruction and models provide basic cardiac anatomy and physiology, with the coronary electrical and arterial systems. Review of: AMI, Angina, Valve disease, Heart Failure, Cardiac Arrhythmia, Pacemakers, and the ICD.   Cardiac Procedures: - Group verbal and written  instruction and models to describe the testing methods done to diagnose heart disease. Reviews the outcomes of the test results. Describes the treatment choices: Medical Management, Angioplasty, or Coronary Bypass Surgery.   Cardiac Medications: - Group verbal and written instruction to review commonly prescribed medications for heart disease. Reviews the medication, class of the drug, and side effects. Includes the steps to properly store meds and maintain the prescription regimen.   Go Sex-Intimacy & Heart Disease, Get SMART - Goal Setting: - Group verbal and written instruction through game format to discuss heart disease and the return to sexual intimacy. Provides group verbal and written material to discuss and apply goal setting through the application of the S.M.A.R.T. Method.   Other Matters of the Heart: - Provides group verbal, written materials and models to describe Heart Failure, Angina, Valve Disease, and Diabetes in the realm of heart disease. Includes description of the disease process and treatment options available to the cardiac patient.   Exercise & Equipment Safety: - Individual verbal instruction and demonstration of equipment use and safety with use of the equipment.          Cardiac Rehab from 05/14/2015 in [facility] Cardiac Rehab   Date  05/14/15   Educator  Sb   Instruction Review Code  2- meets goals/outcomes      Infection Prevention: - Provides verbal and written material to individual with discussion of infection control including proper hand washing and proper equipment cleaning during exercise session.      Cardiac Rehab from 05/14/2015 in [facility] Cardiac Rehab   Date  05/14/15   Educator  SB   Instruction Review Code  2- meets goals/outcomes      Falls Prevention: - Provides verbal and written material to individual with discussion of falls prevention and safety.      Cardiac Rehab from 05/14/2015 in [facility] Cardiac Rehab   Date  05/14/15   Educator  SB    Instruction Review Code  2- meets goals/outcomes      Diabetes: - Individual verbal and written instruction to review signs/symptoms of diabetes, desired ranges of glucose level fasting, after meals and with exercise. Advice that pre and post exercise glucose checks will be done for 3 sessions at entry of program.    Knowledge Questionnaire Score:     Knowledge Questionnaire Score - 05/14/15 1851    Knowledge Questionnaire Score   Pre Score 19/28      Personal Goals and Risk Factors at Admission:     Personal Goals and Risk Factors at Admission - 05/14/15 1442    Personal Goals and Risk Factors on Admission    Weight Management Yes;Obesity   Intervention Learn and follow the exercise and diet guidelines while in the program. Utilize the nutrition and education classes to help gain knowledge of the diet and exercise expectations in the program   Intervention Provide weight management tools through evaluation completed by registered dietician and exercise physiologist.  Establish a goal weight with participant.   Admit Weight 204 lb (92.534 kg)   Goal Weight 150 lb (68.04 kg)   Increase Aerobic Exercise and Physical Activity Yes   Intervention While in program, learn and follow the exercise prescription taught. Start at a low level workload and increase workload after able to maintain previous level for 30 minutes. Increase time before increasing intensity.   Understand more about Heart/Pulmonary Disease. Yes   Intervention While in program utilize professionals for any questions, and attend the education sessions. Great websites to use are www.americanheart.org or www.lung.org for reliable information.   Diabetes No   Hypertension Yes   Goal Participant will see blood pressure controlled within the values of 140/20mm/Hg or within value directed by their physician.   Intervention Provide nutrition & aerobic exercise along with prescribed medications to achieve BP 140/90 or less.    Lipids Yes   Goal Cholesterol controlled with medications as prescribed, with individualized exercise RX and with personalized nutrition plan. Value goals: LDL < 70mg , HDL > 40mg . Participant states understanding of desired cholesterol values and following prescriptions.   Intervention Provide nutrition & aerobic exercise along with prescribed medications to achieve LDL 70mg , HDL >40mg .   Stress Yes   Goal To meet with psychosocial counselor for stress and relaxation information and guidance. To state understanding of performing relaxation techniques and or identifying personal stressors.   Intervention Provide education on types of stress, identifiying stressors, and ways to cope with stress. Provide demonstration and active practice of relaxation techniques.      Personal Goals and Risk Factors Review:      Goals and Risk Factor Review      05/30/15 1845 06/21/15 1414  Increase Aerobic Exercise and Physical Activity   Goals Progress/Improvement seen  No No      Comments Unable to attend the past 2 days since she has been out sick.  Has been unable to attend Cardiac Rehab lately and I left her a vm.          Personal Goals Discharge (Final Personal Goals and Risk Factors Review):      Goals and Risk Factor Review - 06/21/15 1414    Increase Aerobic Exercise and Physical Activity   Goals Progress/Improvement seen  No   Comments Has been unable to attend Cardiac Rehab lately and I left her a vm.        Comments: Has only been able to attend orientation appt. Called [place] 05/30/2015 and said she was sick.

## 2015-06-22 ENCOUNTER — Ambulatory Visit: Payer: Medicaid Other

## 2015-06-25 ENCOUNTER — Ambulatory Visit: Payer: Medicaid Other

## 2015-06-26 ENCOUNTER — Encounter: Payer: Self-pay | Admitting: *Deleted

## 2015-06-27 ENCOUNTER — Ambulatory Visit: Payer: Medicaid Other

## 2015-06-29 ENCOUNTER — Ambulatory Visit: Payer: Medicaid Other

## 2015-07-01 ENCOUNTER — Encounter (HOSPITAL_COMMUNITY): Payer: Self-pay | Admitting: *Deleted

## 2015-07-01 ENCOUNTER — Emergency Department (HOSPITAL_COMMUNITY)
Admission: EM | Admit: 2015-07-01 | Discharge: 2015-07-01 | Disposition: A | Discharge status: Home / Self Care | Payer: Medicaid Other | Attending: Emergency Medicine | Admitting: Emergency Medicine

## 2015-07-01 ENCOUNTER — Emergency Department (HOSPITAL_COMMUNITY): Payer: Medicaid Other

## 2015-07-01 DIAGNOSIS — Z79899 Other long term (current) drug therapy: Secondary | ICD-10-CM | POA: Insufficient documentation

## 2015-07-01 DIAGNOSIS — I10 Essential (primary) hypertension: Secondary | ICD-10-CM | POA: Insufficient documentation

## 2015-07-01 DIAGNOSIS — K219 Gastro-esophageal reflux disease without esophagitis: Secondary | ICD-10-CM | POA: Insufficient documentation

## 2015-07-01 DIAGNOSIS — R002 Palpitations: Secondary | ICD-10-CM | POA: Diagnosis not present

## 2015-07-01 DIAGNOSIS — F329 Major depressive disorder, single episode, unspecified: Secondary | ICD-10-CM | POA: Diagnosis not present

## 2015-07-01 DIAGNOSIS — E669 Obesity, unspecified: Secondary | ICD-10-CM | POA: Insufficient documentation

## 2015-07-01 DIAGNOSIS — R2240 Localized swelling, mass and lump, unspecified lower limb: Secondary | ICD-10-CM | POA: Insufficient documentation

## 2015-07-01 DIAGNOSIS — Z791 Long term (current) use of non-steroidal anti-inflammatories (NSAID): Secondary | ICD-10-CM | POA: Insufficient documentation

## 2015-07-01 DIAGNOSIS — R0789 Other chest pain: Secondary | ICD-10-CM | POA: Insufficient documentation

## 2015-07-01 DIAGNOSIS — F419 Anxiety disorder, unspecified: Secondary | ICD-10-CM | POA: Diagnosis not present

## 2015-07-01 DIAGNOSIS — Z7901 Long term (current) use of anticoagulants: Secondary | ICD-10-CM | POA: Insufficient documentation

## 2015-07-01 DIAGNOSIS — E7800 Pure hypercholesterolemia, unspecified: Secondary | ICD-10-CM | POA: Insufficient documentation

## 2015-07-01 DIAGNOSIS — Z86718 Personal history of other venous thrombosis and embolism: Secondary | ICD-10-CM | POA: Insufficient documentation

## 2015-07-01 DIAGNOSIS — R0602 Shortness of breath: Secondary | ICD-10-CM | POA: Insufficient documentation

## 2015-07-01 DIAGNOSIS — R079 Chest pain, unspecified: Secondary | ICD-10-CM | POA: Diagnosis present

## 2015-07-01 LAB — CBC
HEMATOCRIT: 40 % (ref 36.0–46.0)
HEMOGLOBIN: 12.9 g/dL (ref 12.0–15.0)
MCH: 29.1 pg (ref 26.0–34.0)
MCHC: 32.3 g/dL (ref 30.0–36.0)
MCV: 90.1 fL (ref 78.0–100.0)
Platelets: 279 10*3/uL (ref 150–400)
RBC: 4.44 MIL/uL (ref 3.87–5.11)
RDW: 14.3 % (ref 11.5–15.5)
WBC: 6.9 10*3/uL (ref 4.0–10.5)

## 2015-07-01 LAB — I-STAT TROPONIN, ED: TROPONIN I, POC: 0 ng/mL (ref 0.00–0.08)

## 2015-07-01 LAB — BASIC METABOLIC PANEL
Anion gap: 8 (ref 5–15)
BUN: 9 mg/dL (ref 6–20)
CO2: 23 mmol/L (ref 22–32)
Calcium: 8.6 mg/dL — ABNORMAL LOW (ref 8.9–10.3)
Chloride: 110 mmol/L (ref 101–111)
Creatinine, Ser: 0.69 mg/dL (ref 0.44–1.00)
GFR calc Af Amer: >60 mL/min (ref >60)
GLUCOSE: 104 mg/dL — AB (ref 65–99)
POTASSIUM: 4.6 mmol/L (ref 3.5–5.1)
Sodium: 141 mmol/L (ref 135–145)

## 2015-07-01 LAB — BRAIN NATRIURETIC PEPTIDE: B Natriuretic Peptide: 77.4 pg/mL (ref 0.0–100.0)

## 2015-07-01 MED ORDER — DICLOFENAC SODIUM 1 % TD GEL: 2.0000 g | Freq: Four times a day (QID) | TRANSDERMAL | Status: DC

## 2015-07-01 MED ORDER — HYDROCODONE-ACETAMINOPHEN 5-325 MG PO TABS
1.0000 | ORAL_TABLET | Freq: Once | ORAL | Status: AC
  Administered 2015-07-01: 1 via ORAL
  Filled 2015-07-01: qty 1

## 2015-07-01 NOTE — ED Notes (Signed)
Pt in from home c/o pain at L chest where she had a defibrillator placed in 08/16, pt reports radiating pain to the L arm, pt reports HA onset since procedure, pt c/o increased in bil lower legs swelling, pt reports reports not taking Lasix since 07/16, pt denies CP, A&O x4

## 2015-07-01 NOTE — ED Provider Notes (Signed)
CSN: [id number]     Arrival date & time 07/01/15  1052 History   First MD Initiated Contact with Patient 07/01/15 1108     Chief Complaint  Patient presents with  . Chest Pain     (Consider location/radiation/quality/duration/timing/severity/associated sxs/prior Treatment) The history is provided by the patient and medical records. No language interpreter was used.   Claire Mcdonald is a 49 y.o. female  with significant cardiac PMH who presents to the Emergency Department complaining of intermittent, left sided chest pain since defibrillator was implanted (04/25/15). Patient states pain occurs at rest, and is worse with certain movements. Other associated symptoms include leg swelling and shortness of breath. Patient also complaining of right arm pain which is constant and worse with movements. The arm pain has been occuring even when chest pain is absent. Patient states she is compliant with medication.    Past Medical History  Diagnosis Date  . Hypertension   . High cholesterol   . Anxiety   . Depression   . Allergy   . GERD (gastroesophageal reflux disease)   . Migraine   . Diverticulosis     seen on CT 2015  . Obesity (BMI 30-39.9)   . DVT (deep venous thrombosis) ([place])   . 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 fib, EF nl, non-obs CAD; [employer] ICD   Past Surgical History  Procedure Laterality Date  . Tubal ligation  1998  . Ovarian cyst removal Left   . Central line  03/25/2015       . Hand surgery Left   . Cardiac catheterization N/A 03/27/2015    Procedure: Left Heart Cath and Coronary Angiography;  Surgeon: Troy Sine, MD;  LAD 10%/30%, CFX and RCA OK, EF normal  . Ep implantable device N/A 04/11/2015    Procedure: ICD Implant;  Surgeon: Deboraha Sprang, MD;  [facility] ICD, serial number [id number]   Family History  Problem Relation Age of Onset  . Diabetes Father   . Asthma Father   . Hyperlipidemia Father   . Hypertension Father   . Kidney disease Father   .  Stroke Neg Hx   . Asthma Mother   . Depression Mother   . Hyperlipidemia Mother   . Hypertension Mother   . Cancer Maternal Grandmother   . Early death Maternal Grandmother   . Early death Paternal Grandmother   . Allergies Daughter   . Allergies Son   . Allergies Son   . Heart disease Son    Social History  Substance Use Topics  . Smoking status: Never Smoker   . Smokeless tobacco: Never Used  . Alcohol Use: No   OB History    No data available     Review of Systems  Constitutional: Negative.   HENT: Negative for congestion, rhinorrhea and sore throat.   Eyes: Negative for visual disturbance.  Respiratory: Positive for shortness of breath. Negative for cough and wheezing.   Cardiovascular: Positive for chest pain, palpitations and leg swelling.  Gastrointestinal: Negative for nausea, vomiting, abdominal pain, diarrhea and constipation.  Endocrine: Negative for polydipsia and polyuria.  Musculoskeletal: Negative for arthralgias and neck pain.  Skin: Negative for rash.  Neurological: Negative for dizziness, weakness and headaches.      Allergies  Toradol; Tramadol; and Zofran  Home Medications   Prior to Admission medications   Medication Sig Start Date End Date Taking? Authorizing Provider  albuterol (PROVENTIL HFA;VENTOLIN HFA) 108 (90 BASE) MCG/ACT inhaler  Inhale 2 puffs into the lungs every 6 (six) hours as needed for wheezing or shortness of breath. 01/23/15  Yes Jenise V Bacon Menshew, PA-C  apixaban (ELIQUIS) 5 MG TABS tablet Take 1 tablet (5 mg total) by mouth 2 (two) times daily. Take two 5 mg tablets twice  a day for the first week. 05/04/15  Yes [name], NP  atorvastatin (LIPITOR) 20 MG tablet Take 1 tablet (20 mg total) by mouth daily. 06/08/15  Yes [name], NP  DULoxetine (CYMBALTA) 60 MG capsule Take 60 mg by mouth daily.   Yes Historical Provider, MD  levETIRAcetam (KEPPRA) 500 MG tablet Take 1 tablet (500 mg total) by mouth 2 (two) times daily.  04/12/15  Yes Kinnie Feil, MD  metoprolol tartrate (LOPRESSOR) 25 MG tablet Take 0.5 tablets (12.5 mg total) by mouth 2 (two) times daily. Patient taking differently: Take 25 mg by mouth 2 (two) times daily.  04/12/15  Yes Kinnie Feil, MD  omeprazole (PRILOSEC) 20 MG capsule Take 20 mg by mouth daily.   Yes Historical Provider, MD  topiramate (TOPAMAX) 100 MG tablet Take 1 tablet (100 mg total) by mouth 2 (two) times daily. 04/12/15  Yes Kinnie Feil, MD  acetaminophen (TYLENOL) 500 MG tablet Take 2 tablets (1,000 mg total) by mouth 3 (three) times daily. For ONE WEEK Patient not taking: Reported on 05/14/2015 04/25/15   Evelene Croon Barrett, PA-C  diclofenac sodium (VOLTAREN) 1 % GEL Apply 2 g topically 4 (four) times daily. 07/01/15   Jaime Pilcher Ward, PA-C   BP 131/81 mmHg  Pulse 57  Temp(Src) 97.4 F (36.3 C) (Oral)  Resp 15  Ht 5\' 2"  (1.575 m)  Wt 205 lb 6.4 oz (93.169 kg)  BMI 37.56 kg/m2  SpO2 100%  LMP 06/25/2015 Physical Exam  Constitutional: She is oriented to person, place, and time. She appears well-developed and well-nourished.  Alert, speaking in full sentences, and in no acute distress  HENT:  Head: Normocephalic and atraumatic.  Neck: Normal range of motion. Neck supple.  Cardiovascular: Normal heart sounds and intact distal pulses.  Exam reveals no gallop and no friction rub.   No murmur heard. Slightly brady, but regular.  Pulmonary/Chest: Effort normal and breath sounds normal. No respiratory distress. She has no wheezes. She has no rales. She exhibits no tenderness.  TTP of left chest near debrillator incision scar.  Mild erythema near scar, but clean and dry, no sign of infection, no drainage.   Abdominal: She exhibits no mass. There is no rebound and no guarding.  Abdomen soft, non-tender, non-distended Bowel sounds positive in all four quadrants   Musculoskeletal: She exhibits edema.  Tenderness to palpation of left proximal arm; mild erythema, no  demarcated borders, no warmth.  Full ROM of left upper extremity, but with pain.  Left UE neurovascularly intact and 5/5 muscle strength  Lymphadenopathy:    She has no cervical adenopathy.  Neurological: She is alert and oriented to person, place, and time.  Skin: Skin is warm and dry. No rash noted.  Nursing note and vitals reviewed.   ED Course  Procedures (including critical care time) Labs Review Labs Reviewed  BASIC METABOLIC PANEL - Abnormal; Notable for the following:    Glucose, Bld 104 (*)    Calcium 8.6 (*)    All other components within normal limits  CBC  BRAIN NATRIURETIC PEPTIDE  I-STAT TROPOININ, ED    Imaging Review Dg Chest 2 View  07/01/2015  CLINICAL  DATA:  Left upper chest pain near the incision for a recent defibrillator placement. EXAM: CHEST  2 VIEW COMPARISON:  04/03/2015. FINDINGS: Interval left subclavian AICD with the lead tip overlying the right ventricular apex. Clear lungs with normal vascularity. No pneumothorax. Mild scoliosis. Minimal thoracic spine degenerative changes. IMPRESSION: No acute abnormality. Electronically Signed   By: Claudie Revering M.D.   On: 07/01/2015 12:49   I have personally reviewed and evaluated these images and lab results as part of my medical decision-making.   EKG Interpretation None      MDM   Final diagnoses:  Chest pain, atypical   Claire Mcdonald presents with left-sided chest pain. Pain occurs at rest, worse with movement, intermittent for months - likely musculoskeletal but due to significant cardiac history, chest pain r/o  Trop of 0.0, EKG reassuring - cardiac etiology unlikely.  Labs are reassuring, CXR shows no acute abnormality: no pneumo, PNA Pain is in the exact area of difibrillater and irritation / scar tissue is the source of pain.  Will discharge home with volteran gel and instructions to follow up with cardiologist within 3 days.   I explained the diagnosis and have given precautions as to when/if to  return to the ER including for any other new or worsening symptoms. The patient understands and accepts the medical plan as it's been dictated and I have answered their questions. Discharge instructions concerning home care and prescription has been given. The patient is stable and is discharged to home in good condition.    Filed Vitals:   07/01/15 1330  BP: 131/81  Pulse: 57  Temp:   Resp: 15   Patient seen by and discussed with Dr. Sabra Heck who agrees with treatment plan.    [facility] Ward, PA-C 07/01/15 [place], MD 07/01/15 2044

## 2015-07-01 NOTE — ED Provider Notes (Signed)
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 She ended up getting an implantable cardio defibrillator, this has not defibrillated her at all. She has had chronic pain overlying the site since the time of placement. On exam the patient has tenderness over the surgical area, there is minimal scarring, no erythema warmth or swelling, heart and lung sounds are normal. EKG is unremarkable. Doubt cardiac source, heart catheterization after procedure showed no obstructive lesions.  Medical screening examination/treatment/procedure(s) were conducted as a shared visit with non-physician practitioner(s) and myself.  I personally evaluated the patient during the encounter.  Clinical Impression:   Final diagnoses:  Chest pain, atypical         Noemi Chapel, MD 07/01/15 2043

## 2015-07-01 NOTE — Discharge Instructions (Signed)
Treatment: rest, drink plenty of fluids, ice to affected area.  Please follow up with cardiology in the next 3 days and your primary doctor in 3-5 days for discussion of your diagnoses and further evaluation after today's visit. Please return to the ER for any new or worsening symptoms or any additional concerns   Chest Wall Pain Chest wall pain is pain in or around the bones and muscles of your chest. Sometimes, an injury causes this pain. Sometimes, the cause may not be known. This pain may take several weeks or longer to get better. HOME CARE INSTRUCTIONS  Pay attention to any changes in your symptoms. Take these actions to help with your pain:   Rest as told by your health care provider.   Avoid activities that cause pain. These include any activities that use your chest muscles or your abdominal and side muscles to lift heavy items.   If directed, apply ice to the painful area:  Put ice in a plastic bag.  Place a towel between your skin and the bag.  Leave the ice on for 20 minutes, 2-3 times per day.  Take over-the-counter and prescription medicines only as told by your health care provider.  Do not use tobacco products, including cigarettes, chewing tobacco, and e-cigarettes. If you need help quitting, ask your health care provider.  Keep all follow-up visits as told by your health care provider. This is important. SEEK MEDICAL CARE IF:  You have a fever.  Your chest pain becomes worse.  You have new symptoms. SEEK IMMEDIATE MEDICAL CARE IF:  You have nausea or vomiting.  You feel sweaty or light-headed.  You have a cough with phlegm (sputum) or you cough up blood.  You develop shortness of breath.   This information is not intended to replace advice given to you by your health care provider. Make sure you discuss any questions you have with your health care provider.   Document Released: 08/11/2005 Document Revised: 05/02/2015 Document Reviewed:  11/06/2014 Elsevier Interactive Patient Education [employer].

## 2015-07-02 ENCOUNTER — Ambulatory Visit: Payer: Medicaid Other

## 2015-07-02 ENCOUNTER — Encounter: Payer: Self-pay | Admitting: *Deleted

## 2015-07-02 DIAGNOSIS — I214 Non-ST elevation (NSTEMI) myocardial infarction: Secondary | ICD-10-CM

## 2015-07-02 NOTE — Progress Notes (Signed)
Cardiac Individual Treatment Plan  Patient Details  Name: Claire Mcdonald MRN: [id number] Date of Birth: June 14, 1966 Referring Provider:  Deboraha Sprang, MD  Initial Encounter Date:    Visit Diagnosis: Acute non-ST-elevation myocardial infarction [facility])  Patient's Home Medications on Admission:  Current outpatient prescriptions:  .  acetaminophen (TYLENOL) 500 MG tablet, Take 2 tablets (1,000 mg total) by mouth 3 (three) times daily. For ONE WEEK (Patient not taking: Reported on 05/14/2015), Disp: 30 tablet, Rfl: 0 .  albuterol (PROVENTIL HFA;VENTOLIN HFA) 108 (90 BASE) MCG/ACT inhaler, Inhale 2 puffs into the lungs every 6 (six) hours as needed for wheezing or shortness of breath., Disp: 1 Inhaler, Rfl: 0 .  apixaban (ELIQUIS) 5 MG TABS tablet, Take 1 tablet (5 mg total) by mouth 2 (two) times daily. Take two 5 mg tablets twice  a day for the first week., Disp: 60 tablet, Rfl: 3 .  atorvastatin (LIPITOR) 20 MG tablet, Take 1 tablet (20 mg total) by mouth daily., Disp: 30 tablet, Rfl: 1 .  diclofenac sodium (VOLTAREN) 1 % GEL, Apply 2 g topically 4 (four) times daily., Disp: 1 Tube, Rfl: 0 .  DULoxetine (CYMBALTA) 60 MG capsule, Take 60 mg by mouth daily., Disp: , Rfl:  .  levETIRAcetam (KEPPRA) 500 MG tablet, Take 1 tablet (500 mg total) by mouth 2 (two) times daily., Disp: 60 tablet, Rfl: 2 .  metoprolol tartrate (LOPRESSOR) 25 MG tablet, Take 0.5 tablets (12.5 mg total) by mouth 2 (two) times daily. (Patient taking differently: Take 25 mg by mouth 2 (two) times daily. ), Disp: 60 tablet, Rfl: 1 .  omeprazole (PRILOSEC) 20 MG capsule, Take 20 mg by mouth daily., Disp: , Rfl:  .  topiramate (TOPAMAX) 100 MG tablet, Take 1 tablet (100 mg total) by mouth 2 (two) times daily., Disp: 60 tablet, Rfl: 1  Past Medical History: Past Medical History  Diagnosis Date  . Hypertension   . High cholesterol   . Anxiety   . Depression   . Allergy   . GERD (gastroesophageal reflux disease)   . Migraine    . Diverticulosis     seen on CT 2015  . Obesity (BMI 30-39.9)   . DVT (deep venous thrombosis) ([place])   . Idiopathic cardiac arrest ([place]) 02/2015    V. fib, EF nl, non-obs CAD; St. Jude ICD    Tobacco Use: History  Smoking status  . Never Smoker   Smokeless tobacco  . Never Used    Labs: Recent Review Flowsheet Data    Labs for ITP Cardiac and Pulmonary Rehab Latest Ref Rng 03/25/2015 03/25/2015 03/25/2015 03/26/2015 03/28/2015   Trlycerides <150 mg/dL - 133 - - 93   PHART 7.350 - 7.450 7.31(L) - 7.356 7.346(L) -   PCO2ART 35.0 - 45.0 mmHg 40 - 35.7 34.8(L) -   HCO3 20.0 - 24.0 mEq/L 20.1(L) - 19.3(L) 19.1(L) -   TCO2 0 - 100 mmol/L - - 20.4 20 -   ACIDBASEDEF 0.0 - 2.0 mmol/L 5.8(H) - 5.1(H) 6.0(H) -   O2SAT - 94.9 - 99.3 99.0 -       Exercise Target Goals:    Exercise Program Goal: Individual exercise prescription set with THRR, safety & activity barriers. Participant demonstrates ability to understand and report RPE using BORG scale, to self-measure pulse accurately, and to acknowledge the importance of the exercise prescription.  Exercise Prescription Goal: Starting with aerobic activity 30 plus minutes a day, 3 days per week for initial exercise prescription. Provide  home exercise prescription and guidelines that participant acknowledges understanding prior to discharge.  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 Has received injections in lower back for symptoms. Last time was June 2016.   Risk Stratification High      6 Minute Walk:     6 Minute Walk      05/14/15 1613       6 Minute Walk   Phase Initial     Distance 500 feet     Walk Time 3 minutes     Resting HR 68 bpm     Resting BP 104/76 mmHg     Max  Ex. HR 98 bpm     Max Ex. BP 102/60 mmHg     RPE 9     Symptoms Yes (comment)     Comments No symptoms of pain. Had to stop due to weakness in the legs.         Initial Exercise Prescription:     Initial Exercise Prescription - 05/14/15 1600    Date of Initial Exercise Prescription   Date 05/14/15   Bike   Level 0.5   Minutes 15  5 min x 3 with rest in between if needed   Recumbant Bike   Level 1   Watts 20   Minutes 15  5 min x 3 with rest in between if needed   NuStep   Level 2   Watts 20   Minutes 15  5 min x 3 with rest in between if needed   Arm Ergometer   Level 1   Watts 8   Minutes 15  5 min x 3 with rest in between if needed   Arm/Foot Ergometer   Level 1   Watts 10   Minutes 15  5 min x 3 with rest in between if needed   Cybex   Level 1   RPM 30   Minutes 15  5 min x 3 with rest in between if needed   Recumbant Elliptical   Level 1   Watts 20   Minutes 15  5 min x 3 with rest in between if needed   REL-XR   Level 1   Watts 20   Minutes 15  5 min x 3 with rest in between if needed   Prescription Details   Frequency (times per week) 3   Duration Progress to 30 minutes of continuous aerobic without signs/symptoms of physical distress   Intensity   THRR REST +  20   Ratings of Perceived Exertion 11-13   Progression Continue progressive overload as per policy without signs/symptoms or physical distress.   Resistance Training   Training Prescription Yes   Weight 1   Reps 10-12      Exercise Prescription Changes:     Exercise Prescription Changes      05/29/15 0600 06/26/15 0700         Exercise Review   Progression No  Has not started exercise; medical review on 05/14/15 No  Has not started exercise; medical review on 05/14/15         Discharge Exercise Prescription (Final Exercise Prescription Changes):     Exercise Prescription Changes - 06/26/15 0700    Exercise Review   Progression No  Has not  started exercise; medical  review on 05/14/15      Nutrition:  Target Goals: Understanding of nutrition guidelines, daily intake of sodium 1500mg , cholesterol 200mg , calories 30% from fat and 7% or less from saturated fats, daily to have 5 or more servings of fruits and vegetables.  Biometrics:     Pre Biometrics - 05/14/15 1621    Pre Biometrics   Height 5' 1.75" (1.568 m)   Weight 204 lb 12.8 oz (92.897 kg)   Waist Circumference 38.5 inches   Hip Circumference 48 inches   Waist to Hip Ratio 0.8 %   BMI (Calculated) 37.8       Nutrition Therapy Plan and Nutrition Goals:   Nutrition Discharge: Rate Your Plate Scores:   Nutrition Goals Re-Evaluation:   Psychosocial: Target Goals: Acknowledge presence or absence of depression, maximize coping skills, provide positive support system. Participant is able to verbalize types and ability to use techniques and skills needed for reducing stress and depression.  Initial Review & Psychosocial Screening:     Initial Psych Review & Screening - 05/14/15 1905    Initial Review   Current issues with Current Sleep Concerns;Current Stress Concerns   Source of Stress Concerns Financial;Transportation   Family Dynamics   Good Support System? Yes   Comments Children are helping Marilyne at this time   Barriers   Psychosocial barriers to participate in program There are no identifiable barriers or psychosocial needs.;The patient should benefit from training in stress management and relaxation.   Screening Interventions   Interventions Encouraged to exercise;Program counselor consult      Quality of Life Scores:     Quality of Life - 05/14/15 1852    Quality of Life Scores   Health/Function Pre 14.57 %   Socioeconomic Pre 23.14 %   Psych/Spiritual Pre 30 %   Family Pre 28.5 %   GLOBAL Pre 21.56 %      PHQ-9:     Recent Review Flowsheet Data    Depression screen [facility] 2/9 05/14/2015 02/01/2015   Decreased Interest 0 0   Down, Depressed, Hopeless 3 0    PHQ - 2 Score 3 0   Altered sleeping 3 -   Tired, decreased energy 3 -   Change in appetite 3 -   Feeling bad or failure about yourself  0 -   Trouble concentrating 3 -   Moving slowly or fidgety/restless 3 -   Suicidal thoughts 0 -   PHQ-9 Score 18 -   Difficult doing work/chores Not difficult at all -      Psychosocial Evaluation and Intervention:   Psychosocial Re-Evaluation:     Psychosocial Re-Evaluation      05/30/15 1845 06/06/15 1154 06/21/15 1415       Psychosocial Re-Evaluation   Interventions Encouraged to attend Cardiac Rehabilitation for the exercise;Stress management education Stress management education Encouraged to attend Cardiac Rehabilitation for the exercise     Comments Cheyene has the stressor of just being sick so unable to attend Cardiac Rehab the past 2 days.  Stress of being sick and missing still missing CR.  Has been unable to attend Cardiac Rehab lately and I left her a vm.         Vocational Rehabilitation: Provide vocational rehab assistance to qualifying candidates.   Vocational Rehab Evaluation & Intervention:     Vocational Rehab - 05/14/15 1440    Initial Vocational Rehab Evaluation & Intervention   Assessment shows need for Vocational Rehabilitation No  Education: Education Goals: Education classes will be provided on a weekly basis, covering required topics. Participant will state understanding/return demonstration of topics presented.  Learning Barriers/Preferences:     Learning Barriers/Preferences - 05/14/15 1439    Learning Barriers/Preferences   Learning Barriers Sight   Learning Preferences Group Instruction      Education Topics: General Nutrition Guidelines/Fats and Fiber: -Group instruction provided by verbal, written material, models and posters to present the general guidelines for heart healthy nutrition. Gives an explanation and review of dietary fats and fiber.   Controlling Sodium/Reading Food  Labels: -Group verbal and written material supporting the discussion of sodium use in heart healthy nutrition. Review and explanation with models, verbal and written materials for utilization of the food label.   Exercise Physiology & Risk Factors: - Group verbal and written instruction with models to review the exercise physiology of the cardiovascular system and associated critical values. Details cardiovascular disease risk factors and the goals associated with each risk factor.   Aerobic Exercise & Resistance Training: - Gives group verbal and written discussion on the health impact of inactivity. On the components of aerobic and resistive training programs and the benefits of this training and how to safely progress through these programs.   Flexibility, Balance, General Exercise Guidelines: - Provides group verbal and written instruction on the benefits of flexibility and balance training programs. Provides general exercise guidelines with specific guidelines to those with heart or lung disease. Demonstration and skill practice provided.   Stress Management: - Provides group verbal and written instruction about the health risks of elevated stress, cause of high stress, and healthy ways to reduce stress.   Depression: - Provides group verbal and written instruction on the correlation between heart/lung disease and depressed mood, treatment options, and the stigmas associated with seeking treatment.   Anatomy & Physiology of the Heart: - Group verbal and written instruction and models provide basic cardiac anatomy and physiology, with the coronary electrical and arterial systems. Review of: AMI, Angina, Valve disease, Heart Failure, Cardiac Arrhythmia, Pacemakers, and the ICD.   Cardiac Procedures: - Group verbal and written instruction and models to describe the testing methods done to diagnose heart disease. Reviews the outcomes of the test results. Describes the treatment choices:  Medical Management, Angioplasty, or Coronary Bypass Surgery.   Cardiac Medications: - Group verbal and written instruction to review commonly prescribed medications for heart disease. Reviews the medication, class of the drug, and side effects. Includes the steps to properly store meds and maintain the prescription regimen.   Go Sex-Intimacy & Heart Disease, Get SMART - Goal Setting: - Group verbal and written instruction through game format to discuss heart disease and the return to sexual intimacy. Provides group verbal and written material to discuss and apply goal setting through the application of the S.M.A.R.T. Method.   Other Matters of the Heart: - Provides group verbal, written materials and models to describe Heart Failure, Angina, Valve Disease, and Diabetes in the realm of heart disease. Includes description of the disease process and treatment options available to the cardiac patient.   Exercise & Equipment Safety: - Individual verbal instruction and demonstration of equipment use and safety with use of the equipment.          Cardiac Rehab from 05/14/2015 in [facility] Cardiac Rehab   Date  05/14/15   Educator  Sb   Instruction Review Code  2- meets goals/outcomes      Infection Prevention: - Provides verbal and written material  to individual with discussion of infection control including proper hand washing and proper equipment cleaning during exercise session.      Cardiac Rehab from 05/14/2015 in [facility] Cardiac Rehab   Date  05/14/15   Educator  SB   Instruction Review Code  2- meets goals/outcomes      Falls Prevention: - Provides verbal and written material to individual with discussion of falls prevention and safety.      Cardiac Rehab from 05/14/2015 in [facility] Cardiac Rehab   Date  05/14/15   Educator  SB   Instruction Review Code  2- meets goals/outcomes      Diabetes: - Individual verbal and written instruction to review signs/symptoms of diabetes, desired  ranges of glucose level fasting, after meals and with exercise. Advice that pre and post exercise glucose checks will be done for 3 sessions at entry of program.    Knowledge Questionnaire Score:     Knowledge Questionnaire Score - 05/14/15 1851    Knowledge Questionnaire Score   Pre Score 19/28      Personal Goals and Risk Factors at Admission:     Personal Goals and Risk Factors at Admission - 05/14/15 1442    Personal Goals and Risk Factors on Admission    Weight Management Yes;Obesity   Intervention Learn and follow the exercise and diet guidelines while in the program. Utilize the nutrition and education classes to help gain knowledge of the diet and exercise expectations in the program   Intervention Provide weight management tools through evaluation completed by registered dietician and exercise physiologist.  Establish a goal weight with participant.   Admit Weight 204 lb (92.534 kg)   Goal Weight 150 lb (68.04 kg)   Increase Aerobic Exercise and Physical Activity Yes   Intervention While in program, learn and follow the exercise prescription taught. Start at a low level workload and increase workload after able to maintain previous level for 30 minutes. Increase time before increasing intensity.   Understand more about Heart/Pulmonary Disease. Yes   Intervention While in program utilize professionals for any questions, and attend the education sessions. Great websites to use are www.americanheart.org or www.lung.org for reliable information.   Diabetes No   Hypertension Yes   Goal Participant will see blood pressure controlled within the values of 140/87mm/Hg or within value directed by their physician.   Intervention Provide nutrition & aerobic exercise along with prescribed medications to achieve BP 140/90 or less.   Lipids Yes   Goal Cholesterol controlled with medications as prescribed, with individualized exercise RX and with personalized nutrition plan. Value goals: LDL  < 70mg , HDL > 40mg . Participant states understanding of desired cholesterol values and following prescriptions.   Intervention Provide nutrition & aerobic exercise along with prescribed medications to achieve LDL 70mg , HDL >40mg .   Stress Yes   Goal To meet with psychosocial counselor for stress and relaxation information and guidance. To state understanding of performing relaxation techniques and or identifying personal stressors.   Intervention Provide education on types of stress, identifiying stressors, and ways to cope with stress. Provide demonstration and active practice of relaxation techniques.      Personal Goals and Risk Factors Review:      Goals and Risk Factor Review      05/30/15 1845 06/21/15 1414         Increase Aerobic Exercise and Physical Activity   Goals Progress/Improvement seen  No No      Comments Unable to attend the past 2 days  since she has been out sick.  Has been unable to attend Cardiac Rehab lately and I left her a vm.          Personal Goals Discharge (Final Personal Goals and Risk Factors Review):      Goals and Risk Factor Review - 06/21/15 1414    Increase Aerobic Exercise and Physical Activity   Goals Progress/Improvement seen  No   Comments Has been unable to attend Cardiac Rehab lately and I left her a vm.        Comments: 30 day review Has not attended this month. Working on transportation

## 2015-07-03 ENCOUNTER — Telehealth: Payer: Self-pay | Admitting: *Deleted

## 2015-07-03 NOTE — Telephone Encounter (Signed)
Called to check on status to return to program. LMOM 

## 2015-07-04 ENCOUNTER — Ambulatory Visit: Payer: Medicaid Other

## 2015-07-04 ENCOUNTER — Other Ambulatory Visit: Payer: Self-pay | Admitting: *Deleted

## 2015-07-04 DIAGNOSIS — I214 Non-ST elevation (NSTEMI) myocardial infarction: Secondary | ICD-10-CM

## 2015-07-06 ENCOUNTER — Ambulatory Visit: Payer: Medicaid Other

## 2015-07-06 ENCOUNTER — Telehealth: Payer: Self-pay

## 2015-07-06 ENCOUNTER — Other Ambulatory Visit: Payer: Self-pay | Admitting: Unknown Physician Specialty

## 2015-07-06 MED ORDER — LEVETIRACETAM 500 MG PO TABS: 500.0000 mg | ORAL_TABLET | Freq: Two times a day (BID) | ORAL | Status: DC

## 2015-07-06 NOTE — Telephone Encounter (Signed)
Pt needs refill on keppra sent to [facility] [place]. Next appt 08/02/16.

## 2015-07-06 NOTE — Telephone Encounter (Signed)
Called pharmacy to confirm that they got the patient's prescription that I faxed over to them and they stated they did.

## 2015-07-06 NOTE — Telephone Encounter (Signed)
Routing to provider  

## 2015-07-09 ENCOUNTER — Ambulatory Visit: Payer: Medicaid Other

## 2015-07-11 ENCOUNTER — Ambulatory Visit: Payer: Medicaid Other

## 2015-07-13 ENCOUNTER — Ambulatory Visit: Payer: Medicaid Other

## 2015-07-16 ENCOUNTER — Ambulatory Visit (HOSPITAL_COMMUNITY)
Admission: RE | Admit: 2015-07-16 | Discharge: 2015-07-16 | Disposition: A | Discharge status: Home / Self Care | Payer: Medicaid Other | Source: Ambulatory Visit | Attending: Internal Medicine | Admitting: Internal Medicine

## 2015-07-16 ENCOUNTER — Other Ambulatory Visit: Payer: Self-pay

## 2015-07-16 ENCOUNTER — Ambulatory Visit (INDEPENDENT_AMBULATORY_CARE_PROVIDER_SITE_OTHER): Payer: Medicaid Other | Admitting: Internal Medicine

## 2015-07-16 ENCOUNTER — Ambulatory Visit (HOSPITAL_COMMUNITY)
Admission: AD | Admit: 2015-07-16 | Discharge: 2015-07-17 | Disposition: A | Discharge status: Home / Self Care | Payer: Medicaid Other | Source: Ambulatory Visit | Attending: Cardiology | Admitting: Cardiology

## 2015-07-16 ENCOUNTER — Ambulatory Visit: Payer: Medicaid Other

## 2015-07-16 ENCOUNTER — Encounter: Payer: Self-pay | Admitting: Internal Medicine

## 2015-07-16 ENCOUNTER — Encounter (HOSPITAL_COMMUNITY)
Admission: AD | Disposition: A | Discharge status: Home / Self Care | Payer: Medicaid Other | Source: Ambulatory Visit | Attending: Cardiology

## 2015-07-16 VITALS — BP 120/96 | HR 64 | Ht 62.0 in | Wt 207.6 lb

## 2015-07-16 DIAGNOSIS — Z6837 Body mass index (BMI) 37.0-37.9, adult: Secondary | ICD-10-CM | POA: Insufficient documentation

## 2015-07-16 DIAGNOSIS — T82110A Breakdown (mechanical) of cardiac electrode, initial encounter: Secondary | ICD-10-CM

## 2015-07-16 DIAGNOSIS — Z79899 Other long term (current) drug therapy: Secondary | ICD-10-CM | POA: Insufficient documentation

## 2015-07-16 DIAGNOSIS — Z86718 Personal history of other venous thrombosis and embolism: Secondary | ICD-10-CM | POA: Diagnosis not present

## 2015-07-16 DIAGNOSIS — Z95810 Presence of automatic (implantable) cardiac defibrillator: Secondary | ICD-10-CM

## 2015-07-16 DIAGNOSIS — I472 Ventricular tachycardia, unspecified: Secondary | ICD-10-CM

## 2015-07-16 DIAGNOSIS — I4901 Ventricular fibrillation: Secondary | ICD-10-CM

## 2015-07-16 DIAGNOSIS — I5040 Unspecified combined systolic (congestive) and diastolic (congestive) heart failure: Secondary | ICD-10-CM

## 2015-07-16 DIAGNOSIS — E669 Obesity, unspecified: Secondary | ICD-10-CM | POA: Insufficient documentation

## 2015-07-16 DIAGNOSIS — F419 Anxiety disorder, unspecified: Secondary | ICD-10-CM | POA: Insufficient documentation

## 2015-07-16 DIAGNOSIS — T82120A Displacement of cardiac electrode, initial encounter: Secondary | ICD-10-CM | POA: Insufficient documentation

## 2015-07-16 DIAGNOSIS — F329 Major depressive disorder, single episode, unspecified: Secondary | ICD-10-CM | POA: Diagnosis not present

## 2015-07-16 DIAGNOSIS — E7800 Pure hypercholesterolemia, unspecified: Secondary | ICD-10-CM | POA: Diagnosis not present

## 2015-07-16 DIAGNOSIS — I10 Essential (primary) hypertension: Secondary | ICD-10-CM | POA: Diagnosis not present

## 2015-07-16 DIAGNOSIS — Y831 Surgical operation with implant of artificial internal device as the cause of abnormal reaction of the patient, or of later complication, without mention of misadventure at the time of the procedure: Secondary | ICD-10-CM | POA: Insufficient documentation

## 2015-07-16 DIAGNOSIS — K219 Gastro-esophageal reflux disease without esophagitis: Secondary | ICD-10-CM | POA: Insufficient documentation

## 2015-07-16 DIAGNOSIS — Z7901 Long term (current) use of anticoagulants: Secondary | ICD-10-CM | POA: Insufficient documentation

## 2015-07-16 DIAGNOSIS — Z95818 Presence of other cardiac implants and grafts: Secondary | ICD-10-CM

## 2015-07-16 HISTORY — PX: EP IMPLANTABLE DEVICE: SHX172B

## 2015-07-16 LAB — SURGICAL PCR SCREEN
MRSA, PCR: NEGATIVE
STAPHYLOCOCCUS AUREUS: NEGATIVE

## 2015-07-16 LAB — HCG, SERUM, QUALITATIVE: Preg, Serum: NEGATIVE

## 2015-07-16 LAB — CUP PACEART INCLINIC DEVICE CHECK
Implantable Lead Implant Date: 20160817
Implantable Lead Model: 7122
Lead Channel Pacing Threshold Amplitude: 6.25 V
Lead Channel Pacing Threshold Pulse Width: 0.5 ms
Lead Channel Sensing Intrinsic Amplitude: 0.8 mV
Lead Channel Setting Pacing Amplitude: 2 V
Lead Channel Setting Pacing Pulse Width: 0.5 ms
MDC IDC LEAD LOCATION: 753860
MDC IDC MSMT BATTERY REMAINING LONGEVITY: 96 mo
MDC IDC MSMT LEADCHNL RV IMPEDANCE VALUE: 325 Ohm
MDC IDC MSMT LEADCHNL RV PACING THRESHOLD AMPLITUDE: 6.25 V
MDC IDC MSMT LEADCHNL RV PACING THRESHOLD PULSEWIDTH: 0.5 ms
MDC IDC PG SERIAL: 7181802
MDC IDC SESS DTM: 20161121134302
MDC IDC SET LEADCHNL RV SENSING SENSITIVITY: 0.5 mV
MDC IDC STAT BRADY RV PERCENT PACED: 0.74 %

## 2015-07-16 SURGERY — LEAD REVISION/REPAIR

## 2015-07-16 MED ORDER — CEFAZOLIN SODIUM 1-5 GM-% IV SOLN
1.0000 g | Freq: Four times a day (QID) | INTRAVENOUS | Status: DC
  Administered 2015-07-16 – 2015-07-17 (×2): 1 g via INTRAVENOUS
  Filled 2015-07-16 (×3): qty 50

## 2015-07-16 MED ORDER — ATORVASTATIN CALCIUM 20 MG PO TABS: 20.0000 mg | ORAL_TABLET | Freq: Every day | ORAL | Status: DC

## 2015-07-16 MED ORDER — FENTANYL CITRATE (PF) 100 MCG/2ML IJ SOLN
INTRAMUSCULAR | Status: DC | PRN
  Administered 2015-07-16: 25 ug via INTRAVENOUS
  Administered 2015-07-16: 50 ug via INTRAVENOUS
  Administered 2015-07-16 (×2): 25 ug via INTRAVENOUS

## 2015-07-16 MED ORDER — FENTANYL CITRATE (PF) 100 MCG/2ML IJ SOLN: 25.0000 ug | INTRAMUSCULAR | Status: DC | PRN

## 2015-07-16 MED ORDER — SODIUM CHLORIDE 0.9 % IV SOLN
INTRAVENOUS | Status: DC
  Administered 2015-07-16: 13:00:00 via INTRAVENOUS

## 2015-07-16 MED ORDER — MIDAZOLAM HCL 5 MG/5ML IJ SOLN
INTRAMUSCULAR | Status: DC | PRN
  Administered 2015-07-16: 2 mg via INTRAVENOUS
  Administered 2015-07-16 (×3): 1 mg via INTRAVENOUS

## 2015-07-16 MED ORDER — OXYCODONE HCL 5 MG PO TABS
5.0000 mg | ORAL_TABLET | ORAL | Status: DC | PRN
  Administered 2015-07-16 – 2015-07-17 (×4): 5 mg via ORAL
  Filled 2015-07-16 (×4): qty 1

## 2015-07-16 MED ORDER — PANTOPRAZOLE SODIUM 40 MG PO TBEC
40.0000 mg | DELAYED_RELEASE_TABLET | Freq: Every day | ORAL | Status: DC
  Administered 2015-07-16 – 2015-07-17 (×2): 40 mg via ORAL
  Filled 2015-07-16 (×2): qty 1

## 2015-07-16 MED ORDER — GENTAMICIN SULFATE 40 MG/ML IJ SOLN: 80.0000 mg | INTRAMUSCULAR | Status: DC

## 2015-07-16 MED ORDER — TOPIRAMATE 25 MG PO TABS
100.0000 mg | ORAL_TABLET | Freq: Two times a day (BID) | ORAL | Status: DC
  Administered 2015-07-16 – 2015-07-17 (×2): 100 mg via ORAL
  Filled 2015-07-16 (×2): qty 4

## 2015-07-16 MED ORDER — ALBUTEROL SULFATE HFA 108 (90 BASE) MCG/ACT IN AERS: 2.0000 | INHALATION_SPRAY | Freq: Four times a day (QID) | RESPIRATORY_TRACT | Status: DC | PRN

## 2015-07-16 MED ORDER — HEPARIN (PORCINE) IN NACL 2-0.9 UNIT/ML-% IJ SOLN
INTRAMUSCULAR | Status: AC
  Filled 2015-07-16: qty 500

## 2015-07-16 MED ORDER — DULOXETINE HCL 60 MG PO CPEP
60.0000 mg | ORAL_CAPSULE | Freq: Every day | ORAL | Status: DC
  Administered 2015-07-17: 60 mg via ORAL
  Filled 2015-07-16: qty 1

## 2015-07-16 MED ORDER — ALBUTEROL SULFATE (2.5 MG/3ML) 0.083% IN NEBU: 2.5000 mg | INHALATION_SOLUTION | Freq: Four times a day (QID) | RESPIRATORY_TRACT | Status: DC | PRN

## 2015-07-16 MED ORDER — SODIUM CHLORIDE 0.9 % IR SOLN
Status: DC | PRN
  Administered 2015-07-16: 18:00:00

## 2015-07-16 MED ORDER — ACETAMINOPHEN 325 MG PO TABS
325.0000 mg | ORAL_TABLET | ORAL | Status: DC | PRN
  Administered 2015-07-17 (×2): 650 mg via ORAL
  Filled 2015-07-16 (×2): qty 2

## 2015-07-16 MED ORDER — FENTANYL CITRATE (PF) 100 MCG/2ML IJ SOLN
INTRAMUSCULAR | Status: AC
  Filled 2015-07-16: qty 2

## 2015-07-16 MED ORDER — LEVETIRACETAM 500 MG PO TABS
500.0000 mg | ORAL_TABLET | Freq: Two times a day (BID) | ORAL | Status: DC
  Administered 2015-07-16 – 2015-07-17 (×2): 500 mg via ORAL
  Filled 2015-07-16 (×2): qty 1

## 2015-07-16 MED ORDER — CEFAZOLIN SODIUM-DEXTROSE 2-3 GM-% IV SOLR
INTRAVENOUS | Status: AC
  Filled 2015-07-16: qty 50

## 2015-07-16 MED ORDER — MUPIROCIN 2 % EX OINT
1.0000 "application " | TOPICAL_OINTMENT | Freq: Once | CUTANEOUS | Status: AC
  Administered 2015-07-16: 1 via TOPICAL

## 2015-07-16 MED ORDER — MUPIROCIN 2 % EX OINT
TOPICAL_OINTMENT | CUTANEOUS | Status: AC
  Administered 2015-07-16: 1 via TOPICAL
  Filled 2015-07-16: qty 22

## 2015-07-16 MED ORDER — APIXABAN 5 MG PO TABS
5.0000 mg | ORAL_TABLET | Freq: Two times a day (BID) | ORAL | Status: DC
  Administered 2015-07-16 – 2015-07-17 (×2): 5 mg via ORAL
  Filled 2015-07-16 (×2): qty 1

## 2015-07-16 MED ORDER — METOPROLOL TARTRATE 25 MG PO TABS
25.0000 mg | ORAL_TABLET | Freq: Two times a day (BID) | ORAL | Status: DC
  Administered 2015-07-16 – 2015-07-17 (×2): 25 mg via ORAL
  Filled 2015-07-16: qty 1

## 2015-07-16 MED ORDER — LIDOCAINE-EPINEPHRINE 1 %-1:100000 IJ SOLN
INTRAMUSCULAR | Status: AC
  Filled 2015-07-16: qty 3

## 2015-07-16 MED ORDER — MIDAZOLAM HCL 5 MG/5ML IJ SOLN
INTRAMUSCULAR | Status: AC
  Filled 2015-07-16: qty 5

## 2015-07-16 MED ORDER — CEFAZOLIN SODIUM-DEXTROSE 2-3 GM-% IV SOLR
2.0000 g | INTRAVENOUS | Status: AC
  Administered 2015-07-16: 2 g via INTRAVENOUS

## 2015-07-16 SURGICAL SUPPLY — 8 items
CABLE SURGICAL S-101-97-12 (CABLE) ×2 IMPLANT
DEVICE DISSECT PLASMABLAD 3.0S (MISCELLANEOUS) IMPLANT
LEAD DURATA 7122-60CM (Lead) ×2 IMPLANT
PAD DEFIB LIFELINK (PAD) ×2 IMPLANT
PLASMABLADE 3.0S (MISCELLANEOUS) ×3
SET INTRODUCER MICROPUNCT 5F (INTRODUCER) ×2 IMPLANT
SHEATH CLASSIC 7F (SHEATH) ×2 IMPLANT
TRAY PACEMAKER INSERTION (PACKS) ×2 IMPLANT

## 2015-07-16 NOTE — Progress Notes (Signed)
Labs from 2 weeks ago/ Anderson Malta RN cath lab was notified, we can use these labs. Pt is unable to get a urine pgt due to no fluids, cant void, she is on her period. Lab draw ordered.

## 2015-07-16 NOTE — Progress Notes (Signed)
Patient Care Team: [name], NP as PCP - General (Nurse Practitioner)   HPI  Claire Mcdonald is a 49 y.o. female Seen in follow-up for cardiac arrest. Catheterization demonstrated no coronary disease echo showed normal LV function there is no family history.  Cardiac MRI had demonstrated a prior subendocardial infarct flecainide challenge was negative she underwent ICD implantation-St. Jude.  Course was also complicated by DVT-right lower leg without pulmonary embolism  There had been associated "seizure" activity; she was started on Keppra. EEG was normal.  She has chest pains that prompted an evaluation 2 weeks ago in the emergency room. Troponins were normal chest x-ray was obtained  Records and Results Reviewed   Past Medical History  Diagnosis Date  . Hypertension   . High cholesterol   . Anxiety   . Depression   . Allergy   . GERD (gastroesophageal reflux disease)   . Migraine   . Diverticulosis     seen on CT 2015  . Obesity (BMI 30-39.9)   . DVT (deep venous thrombosis) ([place])   . Idiopathic cardiac arrest ([place]) 02/2015    V. fib, EF nl, non-obs CAD; St. Jude ICD    Past Surgical History  Procedure Laterality Date  . Tubal ligation  1998  . Ovarian cyst removal Left   . Central line  03/25/2015       . Hand surgery Left   . Cardiac catheterization N/A 03/27/2015    Procedure: Left Heart Cath and Coronary Angiography;  Surgeon: Troy Sine, MD;  LAD 10%/30%, CFX and RCA OK, EF normal  . Ep implantable device N/A 04/11/2015    Procedure: ICD Implant;  Surgeon: Deboraha Sprang, MD;  [facility] ICD, serial number [id number]    Current Outpatient Prescriptions  Medication Sig Dispense Refill  . albuterol (PROVENTIL HFA;VENTOLIN HFA) 108 (90 BASE) MCG/ACT inhaler Inhale 2 puffs into the lungs every 6 (six) hours as needed for wheezing or shortness of breath. 1 Inhaler 0  . apixaban (ELIQUIS) 5 MG TABS tablet Take 1 tablet (5 mg total) by mouth 2 (two) times  daily. Take two 5 mg tablets twice  a day for the first week. 60 tablet 3  . atorvastatin (LIPITOR) 20 MG tablet Take 1 tablet (20 mg total) by mouth daily at 6 PM. 90 tablet 1  . DULoxetine (CYMBALTA) 60 MG capsule Take 60 mg by mouth daily.    Marland Kitchen levETIRAcetam (KEPPRA) 500 MG tablet Take 1 tablet (500 mg total) by mouth 2 (two) times daily. 60 tablet 2  . metoprolol tartrate (LOPRESSOR) 25 MG tablet Take 0.5 tablets (12.5 mg total) by mouth 2 (two) times daily. (Patient taking differently: Take 25 mg by mouth 2 (two) times daily. ) 60 tablet 1  . omeprazole (PRILOSEC) 20 MG capsule Take 20 mg by mouth daily.    Marland Kitchen topiramate (TOPAMAX) 100 MG tablet Take 1 tablet (100 mg total) by mouth 2 (two) times daily. 60 tablet 1   No current facility-administered medications for this visit.    Allergies  Allergen Reactions  . Toradol [Ketorolac Tromethamine] Itching and Rash  . Tramadol Itching and Rash  . Zofran [Ondansetron Hcl] Itching and Rash      Review of Systems negative except from HPI and PMH  Physical Exam BP 120/96 mmHg  Pulse 64  Ht 5\' 2"  (1.575 m)  Wt 207 lb 9.6 oz (94.167 kg)  BMI 37.96 kg/m2  LMP 06/25/2015 Well developed and  well nourished in no acute distress HENT normal E scleral and icterus clear Neck Supple JVP flat; carotids brisk and full Clear to ausculation Device pocket well healed; without hematoma or erythema.  There is no tethering *Regular rate and rhythm, no murmurs gallops or rub Soft with active bowel sounds No clubbing cyanosis  Edema Alert and oriented, grossly normal motor and sensory function Skin Warm and Dry  Sinus rhythm at 64 Intervals 15/08/39  Assessment and  Plan  Aborted cardiac arrest  Hypertension  Atypical chest pain  Ventricular lead failure sensing atrium   Chest x-ray from 2 weeks ago was evaluated. It showed retraction of the ICD lead but the tip was in the RV.  We'll repeat the chest x-ray today; with atrial sensing,  she will need revision.  She continues with atypical chest pain. This is occurring in the setting of normal coronary arteries by catheterization 4 months ago.

## 2015-07-16 NOTE — Patient Instructions (Signed)
Medication Instructions: - no changes   Labwork: - none  Procedures/Testing: - A chest x-ray takes a picture of the organs and structures inside the chest, including the heart, lungs, and blood vessels. This test can show several things, including, whether the heart is enlarges; whether fluid is building up in the lungs; and whether pacemaker / defibrillator leads are still in place.- Please go to [facility], Entrance "A." Stop by the volunteer desk on the right when you go in and ask them to direct you to radiology for your x-ray.  ** STAY AT THE HOSPITAL UNTIL THE RADIOLOGIST CALLS [place] WITH THE X-RAY REPORT **   Follow-Up: - Pending X-ray report  Any Additional Special Instructions Will Be Listed Below (If Applicable). - none

## 2015-07-16 NOTE — H&P (Signed)
H&P    Patient ID: Claire Mcdonald MRN: [id number], DOB/AGE: 47-23-1967 47 y.o.  Admit date: 07/16/2015 Date of Consult: 07/16/2015   Primary Physician: [name], NP Primary Cardiologist: Dr. Meda Coffee Electrophysiologist: Dr. Caryl Comes  CC: ICD lead dislodged  HPI: Claire Mcdonald is a 49 y.o. female referred directly to the cath lab/admit secondary to her ICD RV lead being dislodged to undergo lead revision.  She has PMhx of cadiac arrest and underwent ICD implant 04/11/15, HTN, hyperlipidemia, obesity, DVT on Eliquis.  She was seen today in the office by Dr. Caryl Comes, noted atrial sensing on her RV lead, a CXR was done noting her RV lead had retracted into the RA.  She is feeling pain at the ICD site, she states a few weeks ago she was lying on her right side and she felt like the device turned over, it was painful at that time and has been since.  No CP otherwise, no SOB, no palpitations.  No dizziness, near syncope or syncope.  She has not been shocked by her device.   Past Medical History  Diagnosis Date  . Hypertension   . High cholesterol   . Anxiety   . Depression   . Allergy   . GERD (gastroesophageal reflux disease)   . Migraine   . Diverticulosis     seen on CT 2015  . Obesity (BMI 30-39.9)   . DVT (deep venous thrombosis) ([place])   . 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 fib, EF nl, non-obs CAD; St. Jude ICD     Surgical History:  Past Surgical History  Procedure Laterality Date  . Tubal ligation  1998  . Ovarian cyst removal Left   . Central line  03/25/2015       . Hand surgery Left   . Cardiac catheterization N/A 03/27/2015    Procedure: Left Heart Cath and Coronary Angiography;  Surgeon: Troy Sine, MD;  LAD 10%/30%, CFX and RCA OK, EF normal  . Ep implantable device N/A 04/11/2015    Procedure: ICD Implant;  Surgeon: Deboraha Sprang, MD;  [facility] ICD, serial number [id number]     Prescriptions prior to admission  Medication Sig Dispense Refill Last Dose    . albuterol (PROVENTIL HFA;VENTOLIN HFA) 108 (90 BASE) MCG/ACT inhaler Inhale 2 puffs into the lungs every 6 (six) hours as needed for wheezing or shortness of breath. 1 Inhaler 0 Past Week at Unknown time  . apixaban (ELIQUIS) 5 MG TABS tablet Take 1 tablet (5 mg total) by mouth 2 (two) times daily. Take two 5 mg tablets twice  a day for the first week. 60 tablet 3 07/15/2015 at 0930  . atorvastatin (LIPITOR) 20 MG tablet Take 1 tablet (20 mg total) by mouth daily at 6 PM. 90 tablet 1 07/15/2015 at Unknown time  . DULoxetine (CYMBALTA) 60 MG capsule Take 60 mg by mouth daily.   07/15/2015 at Unknown time  . levETIRAcetam (KEPPRA) 500 MG tablet Take 1 tablet (500 mg total) by mouth 2 (two) times daily. 60 tablet 2 07/15/2015 at Unknown time  . metoprolol tartrate (LOPRESSOR) 25 MG tablet Take 0.5 tablets (12.5 mg total) by mouth 2 (two) times daily. (Patient taking differently: Take 25 mg by mouth 2 (two) times daily. ) 60 tablet 1 07/15/2015 at Unknown time  . omeprazole (PRILOSEC) 20 MG capsule Take 20 mg by mouth daily.   07/15/2015 at Unknown time  . topiramate (TOPAMAX) 100 MG tablet Take  1 tablet (100 mg total) by mouth 2 (two) times daily. 60 tablet 1 07/15/2015 at Unknown time    Inpatient Medications:  .  ceFAZolin (ANCEF) IV  2 g Intravenous On Call  . gentamicin irrigation  80 mg Irrigation On Call    Allergies:  Allergies  Allergen Reactions  . Toradol [Ketorolac Tromethamine] Itching and Rash  . Tramadol Itching and Rash  . Zofran [Ondansetron Hcl] Itching and Rash    Social History   Social History  . Marital Status: Single    Spouse Name: N/A  . Number of Children: N/A  . Years of Education: N/A   Occupational History  . Not on file.   Social History Main Topics  . Smoking status: Never Smoker   . Smokeless tobacco: Never Used  . Alcohol Use: No  . Drug Use: No  . Sexual Activity: Yes    Birth Control/ Protection: Surgical   Other Topics Concern  . Not on  file   Social History Narrative   Lives in [place], [place].     Family History  Problem Relation Age of Onset  . Diabetes Father   . Asthma Father   . Hyperlipidemia Father   . Hypertension Father   . Kidney disease Father   . Stroke Neg Hx   . Asthma Mother   . Depression Mother   . Hyperlipidemia Mother   . Hypertension Mother   . Cancer Maternal Grandmother   . Early death Maternal Grandmother   . Early death Paternal Grandmother   . Allergies Daughter   . Allergies Son   . Allergies Son   . Heart disease Son      Review of Systems: All other systems reviewed and are otherwise negative except as noted above.  Physical Exam: Filed Vitals:   07/16/15 1152  Pulse: 68  Temp: 97.9 F (36.6 C)  TempSrc: Oral  Resp: 18  Height: 5\' 2"  (1.575 m)  Weight: 207 lb (93.895 kg)  SpO2: 99%    GEN- The patient is obese, well appearing, alert and oriented x 3 today.   HEENT: normocephalic, atraumatic; sclera clear, conjunctiva pink; hearing intact; oropharynx clear; neck supple, no JVP Lymph- no cervical lymphadenopathy Lungs- Clear to ausculation bilaterally, normal work of breathing.  No wheezes, rales, rhonchi Heart- Regular rate and rhythm, no murmurs, rubs or gallops, PMI not laterally displaced GI- soft, non-tender Extremities- no clubbing, cyanosis, or edema; DP/PT/radial pulses 2+ bilaterally MS- no significant deformity or atrophy Skin- warm and dry, no rash or lesion Psych- euthymic mood, full affect Neuro- no gross deficits observed ICD site is well healed, no erythema or edema, no increased heat to the surrounding tissues.  She does have tenderness to the site to palpation.  Labs:   Lab Results  Component Value Date   WBC 6.9 07/01/2015   HGB 12.9 07/01/2015   HCT 40.0 07/01/2015   MCV 90.1 07/01/2015   PLT 279 07/01/2015     Radiology/Studies:  Dg Chest 2 View 07/16/2015  CLINICAL DATA:  AICD, lead failure, initial encounter EXAM: CHEST  2 VIEW  COMPARISON:  Chest x-ray of 07/01/2015 FINDINGS: The AICD lead tip has retracted, now overlying the region of the right atrium and distal SVC rather than the apex of the right ventricle as previously. The lead appears intact. The lungs are clear. The heart is within normal limits in size. No bony abnormality is seen. IMPRESSION: The AICD lead has retracted with the tip overlying the region of  the right atrium and distal SVC rather than the apex of the right ventricle. Electronically Signed   By: Ivar Drape M.D.   On: 07/16/2015 10:56   03/27/15: Echocardiogram Study Conclusions - Left ventricle: The cavity size was mildly reduced. Wall thickness was increased in a pattern of mild LVH. Systolic function was normal. The estimated ejection fraction was in the range of 55% to 60%. Wall motion was normal; there were no regional wall motion abnormalities.  03/27/15: LHC Conclusion     Prox LAD lesion, 10% stenosed.  Mid LAD lesion, 30% stenosed.  The left ventricular systolic function is normal.  Estimated blood loss: Minimal  Normal LV function with an ejection fraction of 50-55% without segmental wall motion abnormalities.  Mild nonobstructive CAD with 10% ostial smooth LAD stenosis and 30% smooth proximal LAD stenosis immediately after the first diagonal branch and before the first septal perforating artery.  Normal left circumflex and dominant RCA.     03/30/15 Cardiac MR IMPRESSION: 1. Normal left ventricular size, thickness and systolic function (LVEF = 65%) with hypokinesis of the apical inferior wall. 2. Normal right ventricular size, thickness and systolic function (LVEF = 63%) with no regional wall motion abnormalities. 3. Mild left atrial dilatation. 4. Mild mitral and mild tricuspid regurgitation. 5. A subendocardial late gadolinium enhancement in the apical inferior wall is consistent with prior subendocardial infarct. 6. There is no evidence for  ARVC.   EKG: SR, low voltage  DEVICE HISTORY: St Jude ICD, serial number [id number], active fixation defibrillator lead, model St Jude [id number] serial number [id number]   Assessment and Plan:  1. ICD RV lead disloged/retracted to RA     Plan for lead revision today  2. Hx of cardiac arrest August 2016      Catheterization at that time demonstrated no coronary disease echo showed normal LV function there is no family history. Cardiac MRI had demonstrated a prior subendocardial infarct flecainide challenge was negative she underwent ICD implantation-St. Jude.  3. HTN     Appears well controlled  4. ICD pocket discomfort     Lead revision procedure today, monitor post-procedure  5. DVT August 2016     On ELiquis  Claire Mcdonald 07/16/2015 12:55 PM  I have seen and examined this patient with Tommye Standard.  Agree with above, note added to reflect my findings.  On exam, regular rhythm, no murmurs, lungs clear.  Patient with ICD lead in her RA after dislodgement.  Plan for reposition into RV apex.  Risks discussed including inefection, bleeding, tamponade.      Bayley Hurn M. Evrett Hakim MD 07/16/2015 4:08 PM

## 2015-07-17 ENCOUNTER — Ambulatory Visit (HOSPITAL_COMMUNITY): Payer: Medicaid Other

## 2015-07-17 ENCOUNTER — Encounter (HOSPITAL_COMMUNITY): Payer: Self-pay | Admitting: Cardiology

## 2015-07-17 DIAGNOSIS — F419 Anxiety disorder, unspecified: Secondary | ICD-10-CM | POA: Diagnosis not present

## 2015-07-17 DIAGNOSIS — E669 Obesity, unspecified: Secondary | ICD-10-CM | POA: Diagnosis not present

## 2015-07-17 DIAGNOSIS — I10 Essential (primary) hypertension: Secondary | ICD-10-CM | POA: Diagnosis not present

## 2015-07-17 DIAGNOSIS — T82120A Displacement of cardiac electrode, initial encounter: Secondary | ICD-10-CM | POA: Diagnosis not present

## 2015-07-17 MED ORDER — HYDROCODONE-ACETAMINOPHEN 5-325 MG PO TABS: 1.0000 | ORAL_TABLET | Freq: Four times a day (QID) | ORAL | Status: DC | PRN

## 2015-07-17 MED FILL — Lidocaine Inj 1% w/ Epinephrine-1:100000: INTRAMUSCULAR | Qty: 20 | Status: AC

## 2015-07-17 MED FILL — Heparin Sodium (Porcine) 2 Unit/ML in Sodium Chloride 0.9%: INTRAMUSCULAR | Qty: 500 | Status: AC

## 2015-07-17 NOTE — Discharge Instructions (Signed)
° ° °  Supplemental Discharge Instructions for  Pacemaker/Defibrillator Patients  Activity No heavy lifting or vigorous activity with your left/right arm for 6 to 8 weeks.  Do not raise your left/right arm above your head for one week.  Gradually raise your affected arm as drawn below.           __      07/21/15               07/22/15                     07/23/15             07/24/15  NO DRIVING for  6 months      WOUND CARE - Keep the wound area clean and dry.  Do not get this area wet for one week. No showers for one week; you may shower on  07/24/15   . - The tape/steri-strips on your wound will fall off; do not pull them off.  No bandage is needed on the site.  DO  NOT apply any creams, oils, or ointments to the wound area. - If you notice any drainage or discharge from the wound, any swelling or bruising at the site, or you develop a fever > 101? F after you are discharged home, call the office at once.  Special Instructions - You are still able to use cellular telephones; use the ear opposite the side where you have your pacemaker/defibrillator.  Avoid carrying your cellular phone near your device. - When traveling through airports, show security personnel your identification card to avoid being screened in the metal detectors.  Ask the security personnel to use the hand wand. - Avoid arc welding equipment, MRI testing (magnetic resonance imaging), TENS units (transcutaneous nerve stimulators).  Call the office for questions about other devices. - Avoid electrical appliances that are in poor condition or are not properly grounded. - Microwave ovens are safe to be near or to operate.  Additional information for defibrillator patients should your device go off: - If your device goes off ONCE and you feel fine afterward, notify the device clinic nurses. - If your device goes off ONCE and you do not feel well afterward, call 911. - If your device goes off TWICE, call 911. - If your  device goes off THREE times in one day, call 911.  DO NOT DRIVE YOURSELF OR A FAMILY MEMBER WITH A DEFIBRILLATOR TO THE HOSPITAL--CALL 911.

## 2015-07-17 NOTE — Discharge Summary (Signed)
ELECTROPHYSIOLOGY PROCEDURE DISCHARGE SUMMARY    Patient ID: Claire Mcdonald,  MRN: [id number], DOB/AGE: June 28, 1966 49 y.o.  Admit date: 07/16/2015 Discharge date: 07/17/2015  Primary Care Physician: [name], NP Primary Cardiologist: Meda Coffee Electrophysiologist: Caryl Comes  Primary Discharge Diagnosis:  ICD lead dislodgement status post lead revision this admission  Secondary Discharge Diagnosis:  1.  VF arrest 2.  Hypertension 3.  Hyperlipidemia 4.  Anxiety 5.  Depression 6.  Obesity   Allergies  Allergen Reactions  . Toradol [Ketorolac Tromethamine] Itching and Rash  . Tramadol Itching and Rash  . Zofran [Ondansetron Hcl] Itching and Rash     Procedures This Admission:  1.  RV lead revision on 07/16/15 by Dr Curt Bears. There were no immediate post procedure complications. 2.  CXR on 07/17/15 demonstrated no pneumothorax status post device implantation.   Brief HPI: Claire Mcdonald is a 49 y.o. female with prior VF arrest s/p ICD implant. She presented to the office for device interrogation where she had failure to sense.  CXR demonstrated RV lead retracted back into SVC.  Risks, benefits, and alternatives to lead revision were reviewed with the patient who wished to proceed.   Hospital Course:  The patient was admitted and underwent RV lead revision with details as outlined above. She was monitored on telemetry overnight which demonstrated sinus rhythm.  Left chest was without hematoma or ecchymosis.  The device was interrogated and found to be functioning normally.  CXR was obtained and demonstrated no pneumothorax status post device implantation.  Wound care, arm mobility, and restrictions were reviewed with the patient.  The patient was examined and considered stable for discharge to home.   The patient's discharge medications include a beta blocker (Metoprolol). No ACE-I with normal LV function  Physical Exam: Filed Vitals:   07/16/15 1836 07/16/15 1859 07/16/15 2132  07/17/15 0437  BP:  119/60 123/77 105/63  Pulse: 0 77 77 68  Temp:  97.5 F (36.4 C) 98.2 F (36.8 C) 98 F (36.7 C)  TempSrc:  Oral Oral Oral  Resp: 0 19 18 18   Height:      Weight:      SpO2: 0% 100% 100% 96%    GEN- The patient is obese appearing, alert and oriented x 3 today.   HEENT: normocephalic, atraumatic; sclera clear, conjunctiva pink; hearing intact; oropharynx clear; neck supple  Lungs- Clear to ausculation bilaterally, normal work of breathing.  No wheezes, rales, rhonchi Heart- Regular rate and rhythm  GI- soft, non-tender, non-distended, bowel sounds present  Extremities- no clubbing, cyanosis, or edema  MS- no significant deformity or atrophy Skin- warm and dry, no rash or lesion, left chest without hematoma/ecchymosis Psych- euthymic mood, full affect Neuro- strength and sensation are intact   Labs:   Lab Results  Component Value Date   WBC 6.9 07/01/2015   HGB 12.9 07/01/2015   HCT 40.0 07/01/2015   MCV 90.1 07/01/2015   PLT 279 07/01/2015   No results for input(s): NA, K, CL, CO2, BUN, CREATININE, CALCIUM, PROT, BILITOT, ALKPHOS, ALT, AST, GLUCOSE in the last 168 hours.  Invalid input(s): LABALBU  Discharge Medications:    Medication List    TAKE these medications        albuterol 108 (90 BASE) MCG/ACT inhaler  Commonly known as:  PROVENTIL HFA;VENTOLIN HFA  Inhale 2 puffs into the lungs every 6 (six) hours as needed for wheezing or shortness of breath.     apixaban 5 MG Tabs tablet  Commonly known as:  ELIQUIS  Take 1 tablet (5 mg total) by mouth 2 (two) times daily. Take two 5 mg tablets twice  a day for the first week.     atorvastatin 20 MG tablet  Commonly known as:  LIPITOR  Take 1 tablet (20 mg total) by mouth daily at 6 PM.     DULoxetine 60 MG capsule  Commonly known as:  CYMBALTA  Take 60 mg by mouth daily.     HYDROcodone-acetaminophen 5-325 MG tablet  Commonly known as:  NORCO  Take 1 tablet by mouth every 6 (six) hours as  needed for moderate pain.     levETIRAcetam 500 MG tablet  Commonly known as:  KEPPRA  Take 1 tablet (500 mg total) by mouth 2 (two) times daily.     metoprolol tartrate 25 MG tablet  Commonly known as:  LOPRESSOR  Take 0.5 tablets (12.5 mg total) by mouth 2 (two) times daily.     omeprazole 20 MG capsule  Commonly known as:  PRILOSEC  Take 20 mg by mouth daily.     topiramate 100 MG tablet  Commonly known as:  TOPAMAX  Take 1 tablet (100 mg total) by mouth 2 (two) times daily.        Disposition:  Discharge Instructions    Diet - low sodium heart healthy    Complete by:  As directed      Increase activity slowly    Complete by:  As directed           Follow-up Information    Follow up with [facility] On 07/25/2015.   Specialty:  Cardiology   Why:  at 12:30PM   Contact information:   [address] [place] [address] [phone]      Duration of Discharge Encounter: Greater than 30 minutes including physician time.  Signed, Chanetta Marshall, NP 07/17/2015 7:47 AM   I have seen and examined this patient with Chanetta Marshall.  Agree with above, note added to reflect my findings.  On exam, regular rhythm, no mumurs, lungs clear.  Presented with lead dislodgement.  Revised ICD lead without complication.  CXR with no complications.  Lead posterior on CXR.  Interrogation of the lead with ECG shows LBBB pattern.  ICD interrogation shows no abnormalities and unchanged lead parameters from implant.    Ellena Kamen M. Tamber Burtch MD 07/17/2015 12:57 PM

## 2015-07-17 NOTE — Progress Notes (Signed)
Discharged with instructions and prescription, voiced understanding.

## 2015-07-18 ENCOUNTER — Ambulatory Visit: Payer: Medicaid Other

## 2015-07-20 ENCOUNTER — Emergency Department: Payer: Medicaid Other

## 2015-07-20 ENCOUNTER — Encounter: Payer: Self-pay | Admitting: Emergency Medicine

## 2015-07-20 ENCOUNTER — Emergency Department
Admission: EM | Admit: 2015-07-20 | Discharge: 2015-07-21 | Disposition: A | Discharge status: Home / Self Care | Payer: Medicaid Other | Attending: Emergency Medicine | Admitting: Emergency Medicine

## 2015-07-20 ENCOUNTER — Ambulatory Visit: Payer: Medicaid Other

## 2015-07-20 DIAGNOSIS — R109 Unspecified abdominal pain: Secondary | ICD-10-CM

## 2015-07-20 DIAGNOSIS — A084 Viral intestinal infection, unspecified: Secondary | ICD-10-CM | POA: Insufficient documentation

## 2015-07-20 DIAGNOSIS — Z3202 Encounter for pregnancy test, result negative: Secondary | ICD-10-CM | POA: Diagnosis not present

## 2015-07-20 DIAGNOSIS — Z79899 Other long term (current) drug therapy: Secondary | ICD-10-CM | POA: Diagnosis not present

## 2015-07-20 DIAGNOSIS — Z7902 Long term (current) use of antithrombotics/antiplatelets: Secondary | ICD-10-CM | POA: Diagnosis not present

## 2015-07-20 DIAGNOSIS — I10 Essential (primary) hypertension: Secondary | ICD-10-CM | POA: Diagnosis not present

## 2015-07-20 DIAGNOSIS — R1030 Lower abdominal pain, unspecified: Secondary | ICD-10-CM | POA: Diagnosis present

## 2015-07-20 LAB — BASIC METABOLIC PANEL
Anion gap: 7 (ref 5–15)
BUN: 11 mg/dL (ref 6–20)
CALCIUM: 8.6 mg/dL — AB (ref 8.9–10.3)
CO2: 23 mmol/L (ref 22–32)
CREATININE: 0.68 mg/dL (ref 0.44–1.00)
Chloride: 112 mmol/L — ABNORMAL HIGH (ref 101–111)
GFR calc Af Amer: >60 mL/min (ref >60)
GFR calc non Af Amer: >60 mL/min (ref >60)
Glucose, Bld: 97 mg/dL (ref 65–99)
Potassium: 3.5 mmol/L (ref 3.5–5.1)
SODIUM: 142 mmol/L (ref 135–145)

## 2015-07-20 LAB — URINALYSIS COMPLETE WITH MICROSCOPIC (ARMC ONLY)
BILIRUBIN URINE: NEGATIVE
Bacteria, UA: NONE SEEN
GLUCOSE, UA: NEGATIVE mg/dL
Ketones, ur: NEGATIVE mg/dL
LEUKOCYTES UA: NEGATIVE
Nitrite: NEGATIVE
Protein, ur: NEGATIVE mg/dL
Specific Gravity, Urine: 1.02 (ref 1.005–1.030)
pH: 5 (ref 5.0–8.0)

## 2015-07-20 LAB — CBC WITH DIFFERENTIAL/PLATELET
BASOS ABS: 0 10*3/uL (ref 0–0.1)
Basophils Relative: 1 %
Eosinophils Absolute: 0.2 10*3/uL (ref 0–0.7)
Eosinophils Relative: 2 %
HEMATOCRIT: 38.7 % (ref 35.0–47.0)
Hemoglobin: 12.6 g/dL (ref 12.0–16.0)
LYMPHS PCT: 32 %
Lymphs Abs: 2.1 10*3/uL (ref 1.0–3.6)
MCH: 29.2 pg (ref 26.0–34.0)
MCHC: 32.6 g/dL (ref 32.0–36.0)
MCV: 89.6 fL (ref 80.0–100.0)
MONO ABS: 0.4 10*3/uL (ref 0.2–0.9)
Monocytes Relative: 7 %
Neutro Abs: 3.9 10*3/uL (ref 1.4–6.5)
Neutrophils Relative %: 58 %
Platelets: 277 10*3/uL (ref 150–440)
RBC: 4.32 MIL/uL (ref 3.80–5.20)
RDW: 14.6 % — AB (ref 11.5–14.5)
WBC: 6.6 10*3/uL (ref 3.6–11.0)

## 2015-07-20 LAB — PREGNANCY, URINE: PREG TEST UR: NEGATIVE

## 2015-07-20 MED ORDER — PROMETHAZINE HCL 12.5 MG PO TABS: 12.5000 mg | ORAL_TABLET | Freq: Four times a day (QID) | ORAL | Status: DC | PRN

## 2015-07-20 MED ORDER — DICYCLOMINE HCL 20 MG PO TABS: 20.0000 mg | ORAL_TABLET | Freq: Three times a day (TID) | ORAL | Status: DC | PRN

## 2015-07-20 MED ORDER — HYDROMORPHONE HCL 1 MG/ML IJ SOLN
1.0000 mg | Freq: Once | INTRAMUSCULAR | Status: AC
  Administered 2015-07-20: 1 mg via INTRAVENOUS
  Filled 2015-07-20: qty 1

## 2015-07-20 MED ORDER — IOHEXOL 240 MG/ML SOLN
25.0000 mL | Freq: Once | INTRAMUSCULAR | Status: AC | PRN
  Administered 2015-07-20: 25 mL via ORAL

## 2015-07-20 MED ORDER — PROMETHAZINE HCL 12.5 MG RE SUPP: 12.5000 mg | Freq: Four times a day (QID) | RECTAL | Status: DC | PRN

## 2015-07-20 MED ORDER — SODIUM CHLORIDE 0.9 % IV BOLUS (SEPSIS)
500.0000 mL | Freq: Once | INTRAVENOUS | Status: AC
  Administered 2015-07-20: 500 mL via INTRAVENOUS

## 2015-07-20 MED ORDER — PROMETHAZINE HCL 25 MG/ML IJ SOLN
12.5000 mg | Freq: Once | INTRAMUSCULAR | Status: AC
  Administered 2015-07-20: 12.5 mg via INTRAVENOUS
  Filled 2015-07-20: qty 1

## 2015-07-20 MED ORDER — IOHEXOL 350 MG/ML SOLN
100.0000 mL | Freq: Once | INTRAVENOUS | Status: AC | PRN
  Administered 2015-07-20: 100 mL via INTRAVENOUS

## 2015-07-20 NOTE — ED Notes (Signed)
MD at bedside. 

## 2015-07-20 NOTE — ED Notes (Signed)
Patient transported to CT 

## 2015-07-20 NOTE — Discharge Instructions (Signed)
Abdominal Pain, Adult Many things can cause abdominal pain. Usually, abdominal pain is not caused by a disease and will improve without treatment. It can often be observed and treated at home. Your health care provider will do a physical exam and possibly order blood tests and X-rays to help determine the seriousness of your pain. However, in many cases, more time must pass before a clear cause of the pain can be found. Before that point, your health care provider may not know if you need more testing or further treatment. HOME CARE INSTRUCTIONS Monitor your abdominal pain for any changes. The following actions may help to alleviate any discomfort you are experiencing:  Only take over-the-counter or prescription medicines as directed by your health care provider.  Do not take laxatives unless directed to do so by your health care provider.  Try a clear liquid diet (broth, tea, or water) as directed by your health care provider. Slowly move to a bland diet as tolerated. SEEK MEDICAL CARE IF:  You have unexplained abdominal pain.  You have abdominal pain associated with nausea or diarrhea.  You have pain when you urinate or have a bowel movement.  You experience abdominal pain that wakes you in the night.  You have abdominal pain that is worsened or improved by eating food.  You have abdominal pain that is worsened with eating fatty foods.  You have a fever. SEEK IMMEDIATE MEDICAL CARE IF:  Your pain does not go away within 2 hours.  You keep throwing up (vomiting).  Your pain is felt only in portions of the abdomen, such as the right side or the left lower portion of the abdomen.  You pass bloody or black tarry stools. MAKE SURE YOU:  Understand these instructions.  Will watch your condition.  Will get help right away if you are not doing well or get worse.   This information is not intended to replace advice given to you by your health care provider. Make sure you discuss  any questions you have with your health care provider.   Document Released: 05/21/2005 Document Revised: 05/02/2015 Document Reviewed: 04/20/2013 Elsevier Interactive Patient Education [employer].  Please return immediately if condition worsens. Please contact her primary physician or the physician you were given for referral. If you have any specialist physicians involved in her treatment and plan please also contact them. Thank you for using [facility]. Please return especially for bloody diarrhea, fever, increased focal abdominal pain, uncontrolled vomiting or any new concerns.

## 2015-07-20 NOTE — ED Notes (Signed)
Pt. States lower transverse abdominal pain that started yesterday morning.  Pt. States hx of ovarian cysts, pt. States pain feels similiarly.  Pt. Also States "I had adjustment done to pacemaker this past Monday".

## 2015-07-20 NOTE — ED Notes (Signed)
Pt returned from CT at this time.  

## 2015-07-21 NOTE — ED Provider Notes (Signed)
Time Seen: Approximately 2100 I have reviewed the triage notes  Chief Complaint: Abdominal Pain   History of Present Illness: Claire Mcdonald is a 49 y.o. female *who presents with some lower middle quadrant abdominal pain for the last couple of days along with some nausea and loose stool. Patient states she has persistent nausea and vomited 1 with no blood or bile. She describes loose stool with 2 bowel movements today without any melena or hematochezia. Patient otherwise does not have a history of high fever, travel, foodborne exposure or any other history to explain her nausea vomiting diarrhea. She denies any recent antibiotic therapy though she does have an extensive history with recent ICD defibrillator placed. She has a history of diverticulosis.   Past Medical History  Diagnosis Date  . Hypertension   . High cholesterol   . Anxiety   . Depression   . Allergy   . GERD (gastroesophageal reflux disease)   . Migraine   . Diverticulosis     seen on CT 2015  . Obesity (BMI 30-39.9)   . DVT (deep venous thrombosis) ([place])   . 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 fib, EF nl, non-obs CAD; [employer] Jude ICD    Patient Active Problem List   Diagnosis Date Noted  . 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 VT (ventricular tachycardia) ([place]) 07/16/2015  . CAD (coronary artery disease) 04/16/2015  . Seizure disorder ([place]) 04/16/2015  . Heart failure ([place]) 04/16/2015  . Normal coronary arteries 04/09/2015  . Obesity-BMI 40 04/09/2015  . Pulmonary nodule seen on imaging study 04/09/2015  . Chronic anticoagulation 04/09/2015  . DVT (deep venous thrombosis) ([place])   . Cough   . Dyspnea   . Essential hypertension   . Hyperlipemia   . Cardiac arrest; V. fib, [employer] Jude ICD 03/25/2015  . DDD (degenerative disc disease), lumbar 01/28/2015  . Lumbar facet arthropathy 01/28/2015  . 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 Tubal ligation  1998  . Ovarian cyst removal Left   . Central line  03/25/2015       . Hand surgery Left   . Cardiac catheterization N/A 03/27/2015    Procedure: Left Heart Cath and Coronary Angiography;  Surgeon: Troy Sine, MD;  LAD 10%/30%, CFX and RCA OK, EF normal  . Ep implantable device N/A 04/11/2015    Procedure: ICD Implant;  Surgeon: Deboraha Sprang, MD;  [facility] ICD, serial number [id number]  . Ep implantable device N/A 07/16/2015    Procedure: Lead Revision/Repair;  Surgeon: Will Meredith Leeds, MD;  Location: [place] [place] [facility];  Service: Cardiovascular;  Laterality: N/A;    Past Surgical History  Procedure Laterality Date  . Tubal ligation  1998  . Ovarian cyst removal Left   . Central line  03/25/2015       . Hand surgery Left   . Cardiac catheterization N/A 03/27/2015    Procedure: Left Heart Cath and Coronary Angiography;  Surgeon: Troy Sine, MD;  LAD 10%/30%, CFX and RCA OK, EF normal  . Ep implantable device N/A 04/11/2015    Procedure: ICD Implant;  Surgeon: Deboraha Sprang, MD;  [facility] ICD, serial number [id number]  . Ep implantable device N/A 07/16/2015    Procedure: Lead Revision/Repair;  Surgeon: Will Meredith Leeds, MD;  Location: [place] [facility];  Service: Cardiovascular;  Laterality: N/A;    Current Outpatient Rx  Name  Route  Sig  Dispense  Refill  . albuterol (PROVENTIL HFA;VENTOLIN HFA) 108 (90 BASE) MCG/ACT inhaler   Inhalation   Inhale 2 puffs into the lungs every 6 (six) hours as needed for wheezing or shortness of breath.   1 Inhaler   0   . apixaban (ELIQUIS) 5 MG TABS tablet   Oral   Take 1 tablet (5 mg total) by mouth 2 (two) times daily. Take two 5 mg tablets twice  a day for the first week.   60 tablet   3   . atorvastatin (LIPITOR) 20 MG tablet   Oral   Take 1 tablet (20 mg total) by mouth daily at 6 PM.   90 tablet   1   . dicyclomine (BENTYL) 20 MG tablet   Oral   Take 1 tablet (20 mg total) by mouth 3  (three) times daily as needed for spasms.   30 tablet   0   . DULoxetine (CYMBALTA) 60 MG capsule   Oral   Take 60 mg by mouth daily.         Marland Kitchen HYDROcodone-acetaminophen (NORCO) 5-325 MG tablet   Oral   Take 1 tablet by mouth every 6 (six) hours as needed for moderate pain.   10 tablet   0   . levETIRAcetam (KEPPRA) 500 MG tablet   Oral   Take 1 tablet (500 mg total) by mouth 2 (two) times daily.   60 tablet   2   . metoprolol tartrate (LOPRESSOR) 25 MG tablet   Oral   Take 0.5 tablets (12.5 mg total) by mouth 2 (two) times daily. Patient taking differently: Take 25 mg by mouth 2 (two) times daily.    60 tablet   1   . omeprazole (PRILOSEC) 20 MG capsule   Oral   Take 20 mg by mouth daily.         . promethazine (PHENERGAN) 12.5 MG suppository   Rectal   Place 1 suppository (12.5 mg total) rectally every 6 (six) hours as needed for nausea or vomiting.   12 each   0   . promethazine (PHENERGAN) 12.5 MG tablet   Oral   Take 1 tablet (12.5 mg total) by mouth every 6 (six) hours as needed for nausea or vomiting.   30 tablet   0   . topiramate (TOPAMAX) 100 MG tablet   Oral   Take 1 tablet (100 mg total) by mouth 2 (two) times daily.   60 tablet   1     Allergies:  Toradol; Tramadol; and Zofran  Family History: Family History  Problem Relation Age of Onset  . Diabetes Father   . Asthma Father   . Hyperlipidemia Father   . Hypertension Father   . Kidney disease Father   . Stroke Neg Hx   . Asthma Mother   . Depression Mother   . Hyperlipidemia Mother   . Hypertension Mother   . Cancer Maternal Grandmother   . Early death Maternal Grandmother   . Early death Paternal Grandmother   . Allergies Daughter   . Allergies Son   . Allergies Son   . Heart disease Son     Social History: Social History  Substance Use Topics  . Smoking status: Never Smoker   . Smokeless tobacco: Never Used  . Alcohol Use: No     Review of Systems:   10 point  review of systems was performed and was otherwise negative:  Constitutional:  No fever Eyes: No visual disturbances ENT: No sore throat, ear pain Cardiac: No chest pain Respiratory: No shortness of breath, wheezing, or stridor Abdomen: Patient points mainly to the lower abdominal regions on both sides and states pain will occasionally radiate into both flank regions she denies any exacerbating or relieving factors Endocrine: No weight loss, No night sweats Extremities: No peripheral edema, cyanosis Skin: No rashes, easy bruising Neurologic: No focal weakness, trouble with speech or swollowing Urologic: No dysuria, Hematuria, or urinary frequency  Physical Exam:  ED Triage Vitals  Enc Vitals Group     BP 07/20/15 2013 134/79 mmHg     Pulse Rate 07/20/15 2013 68     Resp 07/20/15 2013 18     Temp 07/20/15 2013 98.3 F (36.8 C)     Temp Source 07/20/15 2013 Oral     SpO2 07/20/15 2013 98 %     Weight 07/20/15 2013 207 lb (93.895 kg)     Height 07/20/15 2013 5\' 2"  (1.575 m)     Head Cir --      Peak Flow --      Pain Score 07/20/15 2014 9     Pain Loc --      Pain Edu? --      Excl. in [place]? --     General: Awake , Alert , and Oriented times 3; GCS 15 Head: Normal cephalic , atraumatic Eyes: Pupils equal , round, reactive to light Nose/Throat: No nasal drainage, patent upper airway without erythema or exudate.  Neck: Supple, Full range of motion, No anterior adenopathy or palpable thyroid masses Lungs: Clear to ascultation without wheezes , rhonchi, or rales Heart: Regular rate, regular rhythm without murmurs , gallops , or rubs Abdomen: Mild tenderness lower middle quadrant without rebound, guarding , or rigidity; bowel sounds positive and symmetric in all 4 quadrants. No organomegaly .        Extremities: 2 plus symmetric pulses. No edema, clubbing or cyanosis Neurologic: normal ambulation, Motor symmetric without deficits, sensory intact Skin: warm, dry, no rashes   Labs:    All laboratory work was reviewed including any pertinent negatives or positives listed below:  [place] ([place]) - Abnormal; Notable for the following:    Color, Urine YELLOW (*)    APPearance HAZY (*)    Hgb urine dipstick 2+ (*)    Squamous Epithelial / LPF 6-30 (*)    All other components within normal limits  CBC WITH DIFFERENTIAL/PLATELET - Abnormal; Notable for the following:    RDW 14.6 (*)    All other components within normal limits  BASIC METABOLIC PANEL - Abnormal; Notable for the following:    Chloride 112 (*)    Calcium 8.6 (*)    All other components within normal limits  PREGNANCY, URINE  POC URINE PREG, ED     Radiology: *  CLINICAL DATA: Diffuse abdominal pain, left lower quadrant pain starting yesterday morning  EXAM: CT ABDOMEN AND PELVIS WITH CONTRAST  TECHNIQUE: Multidetector CT imaging of the abdomen and pelvis was performed using the standard protocol following bolus administration of intravenous contrast.  CONTRAST: 129mL OMNIPAQUE IOHEXOL 350 MG/ML SOLN  COMPARISON: 12/30/2014  FINDINGS: Cardiac pacemaker leads are partially visualized. The lung bases are unremarkable.  Sagittal images of the spine shows significant disc space flattening at L5-S1 level. Stable anterolisthesis L5 on S1 vertebral body. Again noted bilateral pars defect at L5 level. Degenerative changes bilateral SI joints.  Enhanced liver  is unremarkable. No gastric outlet obstruction. No calcified gallstones are noted within gallbladder. No aortic aneurysm. The pancreas, spleen and adrenal glands are unremarkable.  There is a small supraumbilical ventral hernia containing fat measures about 2.4 cm without evidence of acute complication.  Kidneys are symmetrical in size and enhancement. No hydronephrosis or hydroureter.  Delayed renal images shows bilateral renal symmetrical excretion. Bilateral visualized proximal  ureter is unremarkable.  No small bowel obstruction. No ascites or free air. No adenopathy. Moderate stool noted within cecum. No pericecal inflammation. Some colonic stool noted in transverse colon. Moderate stool noted within descending colon. No evidence of colitis or diverticulitis. The terminal ileum is unremarkable. Normal appendix is noted in coronal image 86. The uterus and ovaries are unremarkable. No adnexal mass. Urinary bladder is unremarkable. Degenerative changes pubic symphysis. Few diverticula are noted sigmoid colon. No evidence of acute diverticulitis.  IMPRESSION: 1. There is no evidence of acute inflammatory process within abdomen. 2. No hydronephrosis or hydroureter. 3. No small bowel obstruction. 4. No pericecal inflammation. Normal appendix. 5. Stable degenerative changes at L5-S1 level. 6. No adnexal mass. 7. No colitis or diverticulitis.     I personally reviewed the radiologic studies     ED Course:  Patient's stay here was uneventful and she had symptomatic improvement with IV Phenergan and Dilaudid here in emergency department with IV fluid bolus. Differential diagnosis includes but is not exclusive to ovarian cyst, ovarian torsion, acute appendicitis, urinary tract infection, endometriosis, bowel obstruction, colitis, renal colic, gastroenteritis, etc. Given her current clinical presentation and objective findings I felt most likely this was gastroenteritis likely from a viral source. The patient's been advised return here if she has any bloody diarrhea, high fever, uncontrolled vomiting or any other new concerns. She should continue with her other medication and contact her primary physician for further outpatient follow-up.    Assessment: * Acute gastroenteritis  Final Clinical Impression:   Final diagnoses:  Abdominal pain in female     Plan:  Outpatient management Patient was advised to return immediately if condition worsens. Patient  was advised to follow up with her primary care physician or other specialized physicians involved and in their current assessment.           Daymon Larsen, MD 07/21/15 [phone]

## 2015-07-23 ENCOUNTER — Ambulatory Visit: Payer: Medicaid Other

## 2015-07-23 ENCOUNTER — Encounter: Payer: Self-pay | Admitting: *Deleted

## 2015-07-25 ENCOUNTER — Ambulatory Visit: Payer: Medicaid Other

## 2015-07-25 ENCOUNTER — Telehealth: Payer: Self-pay | Admitting: *Deleted

## 2015-07-25 ENCOUNTER — Ambulatory Visit (INDEPENDENT_AMBULATORY_CARE_PROVIDER_SITE_OTHER): Payer: Medicaid Other | Admitting: *Deleted

## 2015-07-25 ENCOUNTER — Other Ambulatory Visit (INDEPENDENT_AMBULATORY_CARE_PROVIDER_SITE_OTHER): Payer: Medicaid Other | Admitting: *Deleted

## 2015-07-25 ENCOUNTER — Encounter: Payer: Self-pay | Admitting: Internal Medicine

## 2015-07-25 DIAGNOSIS — I5040 Unspecified combined systolic (congestive) and diastolic (congestive) heart failure: Secondary | ICD-10-CM

## 2015-07-25 DIAGNOSIS — I469 Cardiac arrest, cause unspecified: Secondary | ICD-10-CM

## 2015-07-25 LAB — CBC
HCT: 38 % (ref 36.0–46.0)
Hemoglobin: 12.1 g/dL (ref 12.0–15.0)
MCH: 28.5 pg (ref 26.0–34.0)
MCHC: 31.8 g/dL (ref 30.0–36.0)
MCV: 89.4 fL (ref 78.0–100.0)
MPV: 10.4 fL (ref 8.6–12.4)
Platelets: 318 10*3/uL (ref 150–400)
RBC: 4.25 MIL/uL (ref 3.87–5.11)
RDW: 14.4 % (ref 11.5–15.5)
WBC: 7.9 10*3/uL (ref 4.0–10.5)

## 2015-07-25 LAB — BASIC METABOLIC PANEL
BUN: 12 mg/dL (ref 7–25)
CO2: 24 mmol/L (ref 20–31)
Calcium: 8.5 mg/dL — ABNORMAL LOW (ref 8.6–10.2)
Chloride: 109 mmol/L (ref 98–110)
Creat: 0.68 mg/dL (ref 0.50–1.10)
Glucose, Bld: 85 mg/dL (ref 65–99)
POTASSIUM: 3.5 mmol/L (ref 3.5–5.3)
SODIUM: 143 mmol/L (ref 135–146)

## 2015-07-25 MED ORDER — FUROSEMIDE 20 MG PO TABS: 20.0000 mg | ORAL_TABLET | Freq: Every day | ORAL | Status: DC

## 2015-07-25 NOTE — Telephone Encounter (Signed)
Had ICD redo.   07/16/2015  .   Not ready yet to return, does want to return.  Plans to return after released by ICD MD.  Dr Virl Axe.

## 2015-07-27 ENCOUNTER — Ambulatory Visit: Payer: Medicaid Other

## 2015-07-27 LAB — CUP PACEART INCLINIC DEVICE CHECK
Battery Remaining Longevity: 96 mo
Brady Statistic RV Percent Paced: 0 %
HighPow Impedance: 46.125
Implantable Lead Implant Date: 20160817
Implantable Lead Location: 753860
Lead Channel Impedance Value: 475 Ohm
Lead Channel Pacing Threshold Amplitude: 1 V
Lead Channel Pacing Threshold Amplitude: 1 V
Lead Channel Pacing Threshold Pulse Width: 0.5 ms
Lead Channel Sensing Intrinsic Amplitude: 11.1 mV
Lead Channel Setting Pacing Amplitude: 3.5 V
Lead Channel Setting Pacing Pulse Width: 0.5 ms
MDC IDC LEAD MODEL: 7122
MDC IDC MSMT LEADCHNL RV PACING THRESHOLD PULSEWIDTH: 0.5 ms
MDC IDC SESS DTM: 20161130184414
MDC IDC SET LEADCHNL RV SENSING SENSITIVITY: 0.5 mV
Pulse Gen Serial Number: 7181802

## 2015-07-27 NOTE — Progress Notes (Signed)
Wound check appointment. Steri-strips removed. Wound without redness or edema. Incision edges approximated, wound well healed. Normal device function. Threshold, sensing, and impedances consistent with implant measurements. Device programmed at 3.5V for extra safety margin until 3 month visit. Histogram distribution appropriate for patient and level of activity. No ventricular arrhythmias noted. Patient educated about wound care, arm mobility, lifting restrictions, shock plan.   Patient c/o L arm/shoulder pain that has been gradually increasing since her last appt w/SK (prior to RV lead revision). Patient states that she had noticed some relief with the use of Norco. Claire Marshall, NP notified about patient c/o, spoke to patient about sx's and ordered BMP and CBC to r/o infection. Handwritten Rx for Norco 5/325mg  given (#10, no RF).  Patient also has c/o LE edema since her Lasix was d/c'd. Amber recommended that she restart Lasix 20mg  daily and f/u w/ SK in 1 month for further eval. Rx sent to pharmacy.

## 2015-07-30 ENCOUNTER — Ambulatory Visit: Payer: Medicaid Other

## 2015-07-30 NOTE — Progress Notes (Signed)
Cardiac Individual Treatment Plan  Patient Details  Name: Claire Mcdonald MRN: [id number] Date of Birth: 12-26-1965 Referring Provider:  No ref. provider found  Initial Encounter Date:    Visit Diagnosis: No diagnosis found.  Patient's Home Medications on Admission:  Current outpatient prescriptions:  .  albuterol (PROVENTIL HFA;VENTOLIN HFA) 108 (90 BASE) MCG/ACT inhaler, Inhale 2 puffs into the lungs every 6 (six) hours as needed for wheezing or shortness of breath., Disp: 1 Inhaler, Rfl: 0 .  apixaban (ELIQUIS) 5 MG TABS tablet, Take 1 tablet (5 mg total) by mouth 2 (two) times daily. Take two 5 mg tablets twice  a day for the first week., Disp: 60 tablet, Rfl: 3 .  atorvastatin (LIPITOR) 20 MG tablet, Take 1 tablet (20 mg total) by mouth daily at 6 PM., Disp: 90 tablet, Rfl: 1 .  dicyclomine (BENTYL) 20 MG tablet, Take 1 tablet (20 mg total) by mouth 3 (three) times daily as needed for spasms., Disp: 30 tablet, Rfl: 0 .  DULoxetine (CYMBALTA) 60 MG capsule, Take 60 mg by mouth daily., Disp: , Rfl:  .  furosemide (LASIX) 20 MG tablet, Take 1 tablet (20 mg total) by mouth daily., Disp: 90 tablet, Rfl: 3 .  HYDROcodone-acetaminophen (NORCO) 5-325 MG tablet, Take 1 tablet by mouth every 6 (six) hours as needed for moderate pain., Disp: 10 tablet, Rfl: 0 .  levETIRAcetam (KEPPRA) 500 MG tablet, Take 1 tablet (500 mg total) by mouth 2 (two) times daily., Disp: 60 tablet, Rfl: 2 .  metoprolol tartrate (LOPRESSOR) 25 MG tablet, Take 0.5 tablets (12.5 mg total) by mouth 2 (two) times daily. (Patient taking differently: Take 25 mg by mouth 2 (two) times daily. ), Disp: 60 tablet, Rfl: 1 .  omeprazole (PRILOSEC) 20 MG capsule, Take 20 mg by mouth daily., Disp: , Rfl:  .  promethazine (PHENERGAN) 12.5 MG suppository, Place 1 suppository (12.5 mg total) rectally every 6 (six) hours as needed for nausea or vomiting., Disp: 12 each, Rfl: 0 .  promethazine (PHENERGAN) 12.5 MG tablet, Take 1 tablet (12.5 mg  total) by mouth every 6 (six) hours as needed for nausea or vomiting., Disp: 30 tablet, Rfl: 0 .  topiramate (TOPAMAX) 100 MG tablet, Take 1 tablet (100 mg total) by mouth 2 (two) times daily., Disp: 60 tablet, Rfl: 1  Past Medical History: Past Medical History  Diagnosis Date  . Hypertension   . High cholesterol   . Anxiety   . Depression   . Allergy   . GERD (gastroesophageal reflux disease)   . Migraine   . Diverticulosis     seen on CT 2015  . Obesity (BMI 30-39.9)   . DVT (deep venous thrombosis) ([place])   . 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 fib, EF nl, non-obs CAD; [employer] ICD    Tobacco Use: History  Smoking status  . Never Smoker   Smokeless tobacco  . Never Used    Labs: Recent Review Flowsheet Data    Labs for ITP Cardiac and Pulmonary Rehab Latest Ref Rng 03/25/2015 03/25/2015 03/25/2015 03/26/2015 03/28/2015   Trlycerides <150 mg/dL - 133 - - 93   PHART 7.350 - 7.450 7.31(L) - 7.356 7.346(L) -   PCO2ART 35.0 - 45.0 mmHg 40 - 35.7 34.8(L) -   HCO3 20.0 - 24.0 mEq/L 20.1(L) - 19.3(L) 19.1(L) -   TCO2 0 - 100 mmol/L - - 20.4 20 -   ACIDBASEDEF 0.0 - 2.0 mmol/L 5.8(H) - 5.1(H) 6.0(H) -  O2SAT - 94.9 - 99.3 99.0 -       Exercise Target Goals:    Exercise Program Goal: Individual exercise prescription set with THRR, safety & activity barriers. Participant demonstrates ability to understand and report RPE using BORG scale, to self-measure pulse accurately, and to acknowledge the importance of the exercise prescription.  Exercise Prescription Goal: Starting with aerobic activity 30 plus minutes a day, 3 days per week for initial exercise prescription. Provide home exercise prescription and guidelines that participant acknowledges understanding prior to discharge.  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 Has received injections in lower back for symptoms. Last time was June 2016.   Risk Stratification High      6 Minute Walk:     6 Minute Walk      05/14/15 1613       6 Minute Walk   Phase Initial     Distance 500 feet     Walk Time 3 minutes     Resting HR 68 bpm     Resting BP 104/76 mmHg     Max Ex. HR 98 bpm     Max Ex. BP 102/60 mmHg     RPE 9     Symptoms Yes (comment)     Comments No symptoms of pain. Had to stop due to weakness in the legs.         Initial Exercise Prescription:     Initial Exercise Prescription - 05/14/15 1600    Date of Initial Exercise Prescription   Date 05/14/15   Bike   Level 0.5   Minutes 15  5 min x 3 with rest in between if needed   Recumbant Bike   Level 1   Watts 20   Minutes 15  5 min x 3 with rest in between if needed   NuStep   Level 2   Watts 20   Minutes 15  5 min x 3 with rest in between if needed   Arm Ergometer   Level 1   Watts 8   Minutes 15  5 min x 3 with rest in between if needed   Arm/Foot Ergometer   Level 1   Watts 10   Minutes 15  5 min x 3 with rest in between if needed   Cybex   Level 1   RPM 30   Minutes 15  5 min x 3 with rest in between if needed   Recumbant Elliptical   Level 1   Watts 20   Minutes 15  5 min x 3 with rest in between if needed   REL-XR   Level 1   Watts 20   Minutes 15  5 min x 3 with rest in between if needed   Prescription Details   Frequency (times per week) 3   Duration Progress to 30 minutes of continuous aerobic without signs/symptoms of physical distress   Intensity   THRR REST +  20   Ratings of Perceived Exertion 11-13   Progression Continue progressive overload as per policy without signs/symptoms or physical distress.   Resistance Training   Training Prescription Yes   Weight 1   Reps 10-12      Exercise Prescription Changes:      Exercise Prescription Changes  05/29/15 0600 06/26/15 0700 07/23/15 1400       Exercise Review   Progression No  Has not started exercise; medical review on 05/14/15 No  Has not started exercise; medical review on 05/14/15 No  Has not started exercise; medical review on 05/14/15        Discharge Exercise Prescription (Final Exercise Prescription Changes):     Exercise Prescription Changes - 07/23/15 1400    Exercise Review   Progression No  Has not started exercise; medical review on 05/14/15      Nutrition:  Target Goals: Understanding of nutrition guidelines, daily intake of sodium 1500mg , cholesterol 200mg , calories 30% from fat and 7% or less from saturated fats, daily to have 5 or more servings of fruits and vegetables.  Biometrics:     Pre Biometrics - 05/14/15 1621    Pre Biometrics   Height 5' 1.75" (1.568 m)   Weight 204 lb 12.8 oz (92.897 kg)   Waist Circumference 38.5 inches   Hip Circumference 48 inches   Waist to Hip Ratio 0.8 %   BMI (Calculated) 37.8       Nutrition Therapy Plan and Nutrition Goals:   Nutrition Discharge: Rate Your Plate Scores:   Nutrition Goals Re-Evaluation:   Psychosocial: Target Goals: Acknowledge presence or absence of depression, maximize coping skills, provide positive support system. Participant is able to verbalize types and ability to use techniques and skills needed for reducing stress and depression.  Initial Review & Psychosocial Screening:     Initial Psych Review & Screening - 05/14/15 1905    Initial Review   Current issues with Current Sleep Concerns;Current Stress Concerns   Source of Stress Concerns Financial;Transportation   Family Dynamics   Good Support System? Yes   Comments Children are helping Kynda at this time   Barriers   Psychosocial barriers to participate in program There are no identifiable barriers or psychosocial needs.;The patient should benefit from training in stress management and  relaxation.   Screening Interventions   Interventions Encouraged to exercise;Program counselor consult      Quality of Life Scores:     Quality of Life - 05/14/15 1852    Quality of Life Scores   Health/Function Pre 14.57 %   Socioeconomic Pre 23.14 %   Psych/Spiritual Pre 30 %   Family Pre 28.5 %   GLOBAL Pre 21.56 %      PHQ-9:     Recent Review Flowsheet Data    Depression screen [facility] 2/9 05/14/2015 02/01/2015   Decreased Interest 0 0   Down, Depressed, Hopeless 3 0   PHQ - 2 Score 3 0   Altered sleeping 3 -   Tired, decreased energy 3 -   Change in appetite 3 -   Feeling bad or failure about yourself  0 -   Trouble concentrating 3 -   Moving slowly or fidgety/restless 3 -   Suicidal thoughts 0 -   PHQ-9 Score 18 -   Difficult doing work/chores Not difficult at all -      Psychosocial Evaluation and Intervention:   Psychosocial Re-Evaluation:     Psychosocial Re-Evaluation      05/30/15 1845 06/06/15 1154 06/21/15 1415       Psychosocial Re-Evaluation   Interventions Encouraged to attend Cardiac Rehabilitation for the exercise;Stress management education Stress management education Encouraged to attend Cardiac Rehabilitation for the exercise     Comments Samir has the stressor of just being sick so unable to attend Cardiac Rehab  the past 2 days.  Stress of being sick and missing still missing CR.  Has been unable to attend Cardiac Rehab lately and I left her a vm.         Vocational Rehabilitation: Provide vocational rehab assistance to qualifying candidates.   Vocational Rehab Evaluation & Intervention:     Vocational Rehab - 05/14/15 1440    Initial Vocational Rehab Evaluation & Intervention   Assessment shows need for Vocational Rehabilitation No      Education: Education Goals: Education classes will be provided on a weekly basis, covering required topics. Participant will state understanding/return demonstration of topics presented.  Learning  Barriers/Preferences:     Learning Barriers/Preferences - 05/14/15 1439    Learning Barriers/Preferences   Learning Barriers Sight   Learning Preferences Group Instruction      Education Topics: General Nutrition Guidelines/Fats and Fiber: -Group instruction provided by verbal, written material, models and posters to present the general guidelines for heart healthy nutrition. Gives an explanation and review of dietary fats and fiber.   Controlling Sodium/Reading Food Labels: -Group verbal and written material supporting the discussion of sodium use in heart healthy nutrition. Review and explanation with models, verbal and written materials for utilization of the food label.   Exercise Physiology & Risk Factors: - Group verbal and written instruction with models to review the exercise physiology of the cardiovascular system and associated critical values. Details cardiovascular disease risk factors and the goals associated with each risk factor.   Aerobic Exercise & Resistance Training: - Gives group verbal and written discussion on the health impact of inactivity. On the components of aerobic and resistive training programs and the benefits of this training and how to safely progress through these programs.   Flexibility, Balance, General Exercise Guidelines: - Provides group verbal and written instruction on the benefits of flexibility and balance training programs. Provides general exercise guidelines with specific guidelines to those with heart or lung disease. Demonstration and skill practice provided.   Stress Management: - Provides group verbal and written instruction about the health risks of elevated stress, cause of high stress, and healthy ways to reduce stress.   Depression: - Provides group verbal and written instruction on the correlation between heart/lung disease and depressed mood, treatment options, and the stigmas associated with seeking treatment.   Anatomy &  Physiology of the Heart: - Group verbal and written instruction and models provide basic cardiac anatomy and physiology, with the coronary electrical and arterial systems. Review of: AMI, Angina, Valve disease, Heart Failure, Cardiac Arrhythmia, Pacemakers, and the ICD.   Cardiac Procedures: - Group verbal and written instruction and models to describe the testing methods done to diagnose heart disease. Reviews the outcomes of the test results. Describes the treatment choices: Medical Management, Angioplasty, or Coronary Bypass Surgery.   Cardiac Medications: - Group verbal and written instruction to review commonly prescribed medications for heart disease. Reviews the medication, class of the drug, and side effects. Includes the steps to properly store meds and maintain the prescription regimen.   Go Sex-Intimacy & Heart Disease, Get SMART - Goal Setting: - Group verbal and written instruction through game format to discuss heart disease and the return to sexual intimacy. Provides group verbal and written material to discuss and apply goal setting through the application of the S.M.A.R.T. Method.   Other Matters of the Heart: - Provides group verbal, written materials and models to describe Heart Failure, Angina, Valve Disease, and Diabetes in the realm of heart disease.  Includes description of the disease process and treatment options available to the cardiac patient.   Exercise & Equipment Safety: - Individual verbal instruction and demonstration of equipment use and safety with use of the equipment.          Cardiac Rehab from 05/14/2015 in [facility] Cardiac Rehab   Date  05/14/15   Educator  Sb   Instruction Review Code  2- meets goals/outcomes      Infection Prevention: - Provides verbal and written material to individual with discussion of infection control including proper hand washing and proper equipment cleaning during exercise session.      Cardiac Rehab from 05/14/2015 in [facility]  Cardiac Rehab   Date  05/14/15   Educator  SB   Instruction Review Code  2- meets goals/outcomes      Falls Prevention: - Provides verbal and written material to individual with discussion of falls prevention and safety.      Cardiac Rehab from 05/14/2015 in [facility] Cardiac Rehab   Date  05/14/15   Educator  SB   Instruction Review Code  2- meets goals/outcomes      Diabetes: - Individual verbal and written instruction to review signs/symptoms of diabetes, desired ranges of glucose level fasting, after meals and with exercise. Advice that pre and post exercise glucose checks will be done for 3 sessions at entry of program.    Knowledge Questionnaire Score:     Knowledge Questionnaire Score - 05/14/15 1851    Knowledge Questionnaire Score   Pre Score 19/28      Personal Goals and Risk Factors at Admission:     Personal Goals and Risk Factors at Admission - 05/14/15 1442    Personal Goals and Risk Factors on Admission    Weight Management Yes;Obesity   Intervention Learn and follow the exercise and diet guidelines while in the program. Utilize the nutrition and education classes to help gain knowledge of the diet and exercise expectations in the program   Intervention Provide weight management tools through evaluation completed by registered dietician and exercise physiologist.  Establish a goal weight with participant.   Admit Weight 204 lb (92.534 kg)   Goal Weight 150 lb (68.04 kg)   Increase Aerobic Exercise and Physical Activity Yes   Intervention While in program, learn and follow the exercise prescription taught. Start at a low level workload and increase workload after able to maintain previous level for 30 minutes. Increase time before increasing intensity.   Understand more about Heart/Pulmonary Disease. Yes   Intervention While in program utilize professionals for any questions, and attend the education sessions. Great websites to use are www.americanheart.org or  www.lung.org for reliable information.   Diabetes No   Hypertension Yes   Goal Participant will see blood pressure controlled within the values of 140/90mm/Hg or within value directed by their physician.   Intervention Provide nutrition & aerobic exercise along with prescribed medications to achieve BP 140/90 or less.   Lipids Yes   Goal Cholesterol controlled with medications as prescribed, with individualized exercise RX and with personalized nutrition plan. Value goals: LDL < 70mg , HDL > 40mg . Participant states understanding of desired cholesterol values and following prescriptions.   Intervention Provide nutrition & aerobic exercise along with prescribed medications to achieve LDL 70mg , HDL >40mg .   Stress Yes   Goal To meet with psychosocial counselor for stress and relaxation information and guidance. To state understanding of performing relaxation techniques and or identifying personal stressors.   Intervention Provide education  on types of stress, identifiying stressors, and ways to cope with stress. Provide demonstration and active practice of relaxation techniques.      Personal Goals and Risk Factors Review:      Goals and Risk Factor Review      05/30/15 1845 06/21/15 1414         Increase Aerobic Exercise and Physical Activity   Goals Progress/Improvement seen  No No      Comments Unable to attend the past 2 days since she has been out sick.  Has been unable to attend Cardiac Rehab lately and I left her a vm.          Personal Goals Discharge (Final Personal Goals and Risk Factors Review):      Goals and Risk Factor Review - 06/21/15 1414    Increase Aerobic Exercise and Physical Activity   Goals Progress/Improvement seen  No   Comments Has been unable to attend Cardiac Rehab lately and I left her a vm.       ITP Comments:     ITP Comments      07/30/15 1221           ITP Comments 30 day review preparation: Continue with ITP. Lezley has been absent this  month. Had procedure done to replace her ICD wire. Plans to return this week.          Comments: out for medical reasons

## 2015-08-01 ENCOUNTER — Ambulatory Visit: Payer: Medicaid Other

## 2015-08-01 NOTE — Addendum Note (Signed)
Addended by: Lynford Humphrey on: 08/01/2015 10:53 AM   Modules accepted: Orders

## 2015-08-03 ENCOUNTER — Other Ambulatory Visit: Payer: Self-pay

## 2015-08-03 ENCOUNTER — Other Ambulatory Visit: Payer: Self-pay | Admitting: Unknown Physician Specialty

## 2015-08-03 ENCOUNTER — Encounter: Payer: Self-pay | Admitting: Unknown Physician Specialty

## 2015-08-03 ENCOUNTER — Ambulatory Visit: Payer: Medicaid Other

## 2015-08-03 ENCOUNTER — Ambulatory Visit (INDEPENDENT_AMBULATORY_CARE_PROVIDER_SITE_OTHER): Payer: Medicaid Other | Admitting: Unknown Physician Specialty

## 2015-08-03 VITALS — BP 132/89 | HR 73 | Temp 98.3°F | Ht 61.6 in | Wt 204.8 lb

## 2015-08-03 DIAGNOSIS — R1030 Lower abdominal pain, unspecified: Secondary | ICD-10-CM

## 2015-08-03 DIAGNOSIS — K625 Hemorrhage of anus and rectum: Secondary | ICD-10-CM | POA: Diagnosis not present

## 2015-08-03 LAB — CBC WITH DIFFERENTIAL/PLATELET
Hematocrit: 40.9 % (ref 34.0–46.6)
Hemoglobin: 13.6 g/dL (ref 11.1–15.9)
Lymphocytes Absolute: 2.6 10*3/uL (ref 0.7–3.1)
Lymphs: 35 %
MCH: 30.1 pg (ref 26.6–33.0)
MCHC: 33.3 g/dL (ref 31.5–35.7)
MCV: 91 fL (ref 79–97)
MID (Absolute): 0.6 10*3/uL (ref 0.1–1.6)
MID: 9 %
NEUTROS ABS: 4.3 10*3/uL (ref 1.4–7.0)
Neutrophils: 56 %
Platelets: 271 10*3/uL (ref 150–379)
RBC: 4.52 x10E6/uL (ref 3.77–5.28)
RDW: 14.7 % (ref 12.3–15.4)
WBC: 7.5 10*3/uL (ref 3.4–10.8)

## 2015-08-03 MED ORDER — METOPROLOL TARTRATE 25 MG PO TABS: 12.5000 mg | ORAL_TABLET | Freq: Two times a day (BID) | ORAL | Status: AC

## 2015-08-03 MED ORDER — APIXABAN 5 MG PO TABS: 5.0000 mg | ORAL_TABLET | Freq: Two times a day (BID) | ORAL | Status: DC

## 2015-08-03 NOTE — Telephone Encounter (Signed)
Patient was seen this morning and pharmacy is [facility].

## 2015-08-03 NOTE — Patient Instructions (Signed)
Diet for Irritable Bowel Syndrome  When you have irritable bowel syndrome (IBS), the foods you eat and your eating habits are very important. IBS may cause various symptoms, such as abdominal pain, constipation, or diarrhea. Choosing the right foods can help ease discomfort caused by these symptoms. Work with your health care provider and dietitian to find the best eating plan to help control your symptoms.  WHAT GENERAL GUIDELINES DO I NEED TO FOLLOW?  · Keep a food diary. This will help you identify foods that cause symptoms. Write down:  · What you eat and when.  · What symptoms you have.  · When symptoms occur in relation to your meals.  · Avoid foods that cause symptoms. Talk with your dietitian about other ways to get the same nutrients that are in these foods.  · Eat more foods that contain fiber. Take a fiber supplement if directed by your dietitian.  · Eat your meals slowly, in a relaxed setting.  · Aim to eat 5-6 small meals per day. Do not skip meals.  · Drink enough fluids to keep your urine clear or pale yellow.  · Ask your health care provider if you should take an over-the-counter probiotic during flare-ups to help restore healthy gut bacteria.  · If you have cramping or diarrhea, try making your meals low in fat and high in carbohydrates. Examples of carbohydrates are pasta, rice, whole grain breads and cereals, fruits, and vegetables.  · If dairy products cause your symptoms to flare up, try eating less of them. You might be able to handle yogurt better than other dairy products because it contains bacteria that help with digestion.  WHAT FOODS ARE NOT RECOMMENDED?  The following are some foods and drinks that may worsen your symptoms:  · Fatty foods, such as French fries.  · Milk products, such as cheese or ice cream.  · Chocolate.  · Alcohol.  · Products with caffeine, such as coffee.  · Carbonated drinks, such as soda.  The items listed above may not be a complete list of foods and beverages to  avoid. Contact your dietitian for more information.  WHAT FOODS ARE GOOD SOURCES OF FIBER?  Your health care provider or dietitian may recommend that you eat more foods that contain fiber. Fiber can help reduce constipation and other IBS symptoms. Add foods with fiber to your diet a little at a time so that your body can get used to them. Too much fiber at once might cause gas and swelling of your abdomen. The following are some foods that are good sources of fiber:  · Apples.  · Peaches.  · Pears.  · Berries.  · Figs.  · Broccoli (raw).  · Cabbage.  · Carrots.  · Raw peas.  · Kidney beans.  · Lima beans.  · Whole grain bread.  · Whole grain cereal.  FOR MORE INFORMATION   International Foundation for Functional Gastrointestinal Disorders: www.iffgd.org  National Institute of Diabetes and Digestive and Kidney Diseases: www.niddk.nih.gov/health-information/health-topics/digestive-diseases/ibs/Pages/facts.aspx     This information is not intended to replace advice given to you by your health care provider. Make sure you discuss any questions you have with your health care provider.     Document Released: 11/01/2003 Document Revised: 09/01/2014 Document Reviewed: 11/11/2013  Elsevier Interactive Patient Education ©2016 [employer]  Irritable Bowel Syndrome, Adult  Irritable bowel syndrome (IBS) is not one specific disease. It is a group of symptoms that affects the organs responsible for digestion (gastrointestinal   or GI tract).   To regulate how your GI tract works, your body sends signals back and forth between your intestines and your brain. If you have IBS, there may be a problem with these signals. As a result, your GI tract does not function normally. Your intestines may become more sensitive and overreact to certain things. This is especially true when you eat certain foods or when you are under stress.   There are four types of IBS. These may be determined based on the consistency of your stool:   · IBS with  diarrhea.    · IBS with constipation.    · Mixed IBS.    · Unsubtyped IBS.    It is important to know which type of IBS you have. Some treatments are more likely to be helpful for certain types of IBS.   CAUSES   The exact cause of IBS is not known.  RISK FACTORS  You may have a higher risk of IBS if:  · You are a woman.  · You are younger than 47 years old.  · You have a family history of IBS.  · You have mental health problems.  · You have had bacterial infection of your GI tract.  SIGNS AND SYMPTOMS   Symptoms of IBS vary from person to person. The main symptom is abdominal pain or discomfort. Additional symptoms usually include one or more of the following:   · Diarrhea, constipation, or both.    · Abdominal swelling or bloating.    · Feeling full or sick after eating a small or regular-size meal.    · Frequent gas.    · Mucus in the stool.    · A feeling of having more stool left after a bowel movement.    Symptoms tend to come and go. They may be associated with stress, psychiatric conditions, or nothing at all.   DIAGNOSIS   There is no specific test to diagnose IBS. Your health care provider will make a diagnosis based on a physical exam, medical history, and your symptoms. You may have other tests to rule out other conditions that may be causing your symptoms. These may include:   · Blood tests.    · X-rays.    · CT scan.  · Endoscopy and colonoscopy. This is a test in which your GI tract is viewed with a long, thin, flexible tube.  TREATMENT  There is no cure for IBS, but treatment can help relieve symptoms. IBS treatment often includes:   · Changes to your diet, such as:    Eating more fiber.    Avoiding foods that cause symptoms.    Drinking more water.    Eating regular, medium-sized portioned meals.  · Medicines. These may include:    Fiber supplements if you have constipation.    Medicine to control diarrhea (antidiarrheal medicines).    Medicine to help control muscle spasms in your GI tract  (antispasmodic medicines).    Medicines to help with any mental health issues, such as antidepressants or tranquilizers.  · Therapy.    Talk therapy may help with anxiety, depression, or other mental health issues that can make IBS symptoms worse.  · Stress reduction.    Managing your stress can help keep symptoms under control.  HOME CARE INSTRUCTIONS   · Take medicines only as directed by your health care provider.  · Eat a healthy diet.    Avoid foods and drinks with added sugar.    Include more whole grains, fruits, and vegetables gradually into your diet.   This may be especially helpful if you have IBS with constipation.    Avoid any foods and drinks that make your symptoms worse. These may include dairy products and caffeinated or carbonated drinks.    Do not eat large meals.    Drink enough fluid to keep your urine clear or pale yellow.  · Exercise regularly. Ask your health care provider for recommendations of good activities for you.  · Keep all follow-up visits as directed by your health care provider. This is important.  SEEK MEDICAL CARE IF:   · You have constant pain.  · You have trouble or pain with swallowing.  · You have worsening diarrhea.  SEEK IMMEDIATE MEDICAL CARE IF:   · You have severe and worsening abdominal pain.    · You have diarrhea and:      You have a rash, stiff neck, or severe headache.      You are irritable, sleepy, or difficult to awaken.      You are weak, dizzy, or extremely thirsty.    · You have bright red blood in your stool or you have black tarry stools.    · You have unusual abdominal swelling that is painful.    · You vomit continuously.    · You vomit blood (hematemesis).    · You have both abdominal pain and a fever.         This information is not intended to replace advice given to you by your health care provider. Make sure you discuss any questions you have with your health care provider.     Document Released: 08/11/2005 Document Revised: 09/01/2014 Document  Reviewed: 04/28/2014  Elsevier Interactive Patient Education ©2016 [employer]

## 2015-08-03 NOTE — Progress Notes (Signed)
BP 132/89 mmHg  Pulse 73  Temp(Src) 98.3 F (36.8 C)  Ht 5' 1.6" (1.565 m)  Wt 204 lb 12.8 oz (92.897 kg)  BMI 37.93 kg/m2  SpO2 100%  LMP 07/16/2015 (Exact Date)   Subjective:    Patient ID: Claire Mcdonald, female    DOB: 1965-11-16, 49 y.o.   MRN: [id number]  HPI: Claire Mcdonald is a 49 y.o. female   Rectal bleeding Off and on for 3-4 weeks.  Alternating constipation and diarrhea.  She presented to the ER at [facility] and recommended a GI referral.    Abdominal pain Went to the ER for this and had an US done.  States she hurts "all the time" with lower abdominal pain.  Has diarrhea.  US done in the ER showing an ovarian cyst.  Has bad acid reflux and is intermittently vomiting. I have reviewed the ER notes.  No CBC done at the time as unable to find a vein. Urine studies and [place] are negative except for probable PCOS.      Relevant past medical, surgical, family and social history reviewed and updated as indicated. Interim medical history since our last visit reviewed. Allergies and medications reviewed and updated.  Review of Systems  Per HPI unless specifically indicated above     Objective:    BP 132/89 mmHg  Pulse 73  Temp(Src) 98.3 F (36.8 C)  Ht 5' 1.6" (1.565 m)  Wt 204 lb 12.8 oz (92.897 kg)  BMI 37.93 kg/m2  SpO2 100%  LMP 07/16/2015 (Exact Date)  Wt Readings from Last 3 Encounters:  08/03/15 204 lb 12.8 oz (92.897 kg)  07/20/15 207 lb (93.895 kg)  07/16/15 207 lb (93.895 kg)    Physical Exam  Constitutional: She is oriented to person, place, and time. She appears well-developed and well-nourished.  HENT:  Head: Normocephalic and atraumatic.  Eyes: Pupils are equal, round, and reactive to light. Right eye exhibits no discharge. Left eye exhibits no discharge. No scleral icterus.  Neck: Normal range of motion. Neck supple. Carotid bruit is not present. No thyromegaly present.  Cardiovascular: Normal rate, regular rhythm and normal heart sounds.  Exam reveals no  gallop and no friction rub.   No murmur heard. Pulmonary/Chest: Effort normal and breath sounds normal. No respiratory distress. She has no wheezes. She has no rales.  Abdominal: Soft. Bowel sounds are normal. There is no tenderness. There is no rebound.  Genitourinary: No breast swelling, tenderness or discharge.  Musculoskeletal: Normal range of motion.  Lymphadenopathy:    She has no cervical adenopathy.  Neurological: She is alert and oriented to person, place, and time.  Skin: Skin is warm, dry and intact. No rash noted.  Psychiatric: She has a normal mood and affect. Her speech is normal and behavior is normal. Judgment and thought content normal. Cognition and memory are normal.    Results for orders placed or performed in visit on 07/25/15  CBC  Result Value Ref Range   WBC 7.9 4.0 - 10.5 K/uL   RBC 4.25 3.87 - 5.11 MIL/uL   Hemoglobin 12.1 12.0 - 15.0 g/dL   HCT 38.0 36.0 - 46.0 %   MCV 89.4 78.0 - 100.0 fL   MCH 28.5 26.0 - 34.0 pg   MCHC 31.8 30.0 - 36.0 g/dL   RDW 14.4 11.5 - 15.5 %   Platelets 318 150 - 400 K/uL   MPV 10.4 8.6 - 12.4 fL  Basic Metabolic Panel (BMET)  Result Value  Ref Range   Sodium 143 135 - 146 mmol/L   Potassium 3.5 3.5 - 5.3 mmol/L   Chloride 109 98 - 110 mmol/L   CO2 24 20 - 31 mmol/L   Glucose, Bld 85 65 - 99 mg/dL   BUN 12 7 - 25 mg/dL   Creat 0.68 0.50 - 1.10 mg/dL   Calcium 8.5 (L) 8.6 - 10.2 mg/dL      Assessment & Plan:   Problem List Items Addressed This Visit    None    Visit Diagnoses    Rectal bleeding    -  Primary    Relevant Orders    Ambulatory referral to Gastroenterology    CBC With Differential/Platelet    Lower abdominal pain        Relevant Orders    Ambulatory referral to Gastroenterology    CBC With Differential/Platelet       Suspect IBS.  Diet and information given.    Follow up plan: Return in about 1 month (around 09/03/2015) for Refer to Dr. Allen Norris.  Recheck in one month for BP and  cholestero

## 2015-08-06 ENCOUNTER — Ambulatory Visit: Payer: Medicaid Other | Admitting: *Deleted

## 2015-08-06 ENCOUNTER — Telehealth: Payer: Self-pay | Admitting: *Deleted

## 2015-08-06 NOTE — Progress Notes (Signed)
Cardiac Individual Treatment Plan  Patient Details  Name: Claire Mcdonald MRN: [id number] Date of Birth: 08/09/1966 Referring Provider:  Deboraha Sprang, MD  Initial Encounter Date:    Visit Diagnosis: No diagnosis found.  Patient's Home Medications on Admission:  Current outpatient prescriptions:  .  albuterol (PROVENTIL HFA;VENTOLIN HFA) 108 (90 BASE) MCG/ACT inhaler, Inhale 2 puffs into the lungs every 6 (six) hours as needed for wheezing or shortness of breath., Disp: 1 Inhaler, Rfl: 0 .  apixaban (ELIQUIS) 5 MG TABS tablet, Take 1 tablet (5 mg total) by mouth 2 (two) times daily., Disp: 60 tablet, Rfl: 12 .  atorvastatin (LIPITOR) 20 MG tablet, Take 1 tablet (20 mg total) by mouth daily at 6 PM., Disp: 90 tablet, Rfl: 1 .  dicyclomine (BENTYL) 20 MG tablet, Take 1 tablet (20 mg total) by mouth 3 (three) times daily as needed for spasms., Disp: 30 tablet, Rfl: 0 .  DULoxetine (CYMBALTA) 60 MG capsule, Take 60 mg by mouth daily., Disp: , Rfl:  .  furosemide (LASIX) 20 MG tablet, Take 1 tablet (20 mg total) by mouth daily., Disp: 90 tablet, Rfl: 3 .  HYDROcodone-acetaminophen (NORCO) 5-325 MG tablet, Take 1 tablet by mouth every 6 (six) hours as needed for moderate pain., Disp: 10 tablet, Rfl: 0 .  levETIRAcetam (KEPPRA) 500 MG tablet, Take 1 tablet (500 mg total) by mouth 2 (two) times daily., Disp: 60 tablet, Rfl: 2 .  metoprolol tartrate (LOPRESSOR) 25 MG tablet, Take 0.5 tablets (12.5 mg total) by mouth 2 (two) times daily., Disp: 60 tablet, Rfl: 1 .  omeprazole (PRILOSEC) 20 MG capsule, Take 20 mg by mouth daily., Disp: , Rfl:  .  promethazine (PHENERGAN) 12.5 MG suppository, Place 1 suppository (12.5 mg total) rectally every 6 (six) hours as needed for nausea or vomiting., Disp: 12 each, Rfl: 0 .  topiramate (TOPAMAX) 100 MG tablet, Take 1 tablet (100 mg total) by mouth 2 (two) times daily., Disp: 60 tablet, Rfl: 1  Past Medical History: Past Medical History  Diagnosis Date  .  Hypertension   . High cholesterol   . Anxiety   . Depression   . Allergy   . GERD (gastroesophageal reflux disease)   . Migraine   . Diverticulosis     seen on CT 2015  . Obesity (BMI 30-39.9)   . DVT (deep venous thrombosis) ([place])   . 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 fib, EF nl, non-obs CAD; [employer] ICD    Tobacco Use: History  Smoking status  . Never Smoker   Smokeless tobacco  . Never Used    Labs: Recent Review Flowsheet Data    Labs for ITP Cardiac and Pulmonary Rehab Latest Ref Rng 03/25/2015 03/25/2015 03/25/2015 03/26/2015 03/28/2015   Trlycerides <150 mg/dL - 133 - - 93   PHART 7.350 - 7.450 7.31(L) - 7.356 7.346(L) -   PCO2ART 35.0 - 45.0 mmHg 40 - 35.7 34.8(L) -   HCO3 20.0 - 24.0 mEq/L 20.1(L) - 19.3(L) 19.1(L) -   TCO2 0 - 100 mmol/L - - 20.4 20 -   ACIDBASEDEF 0.0 - 2.0 mmol/L 5.8(H) - 5.1(H) 6.0(H) -   O2SAT - 94.9 - 99.3 99.0 -       Exercise Target Goals:    Exercise Program Goal: Individual exercise prescription set with THRR, safety & activity barriers. Participant demonstrates ability to understand and report RPE using BORG scale, to self-measure pulse accurately, and to acknowledge the importance of  the exercise prescription.  Exercise Prescription Goal: Starting with aerobic activity 30 plus minutes a day, 3 days per week for initial exercise prescription. Provide home exercise prescription and guidelines that participant acknowledges understanding prior to discharge.  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 Has received injections in lower back for symptoms. Last time was June 2016.   Risk Stratification High      6 Minute Walk:     6 Minute Walk      05/14/15 1613       6  Minute Walk   Phase Initial     Distance 500 feet     Walk Time 3 minutes     Resting HR 68 bpm     Resting BP 104/76 mmHg     Max Ex. HR 98 bpm     Max Ex. BP 102/60 mmHg     RPE 9     Symptoms Yes (comment)     Comments No symptoms of pain. Had to stop due to weakness in the legs.         Initial Exercise Prescription:     Initial Exercise Prescription - 05/14/15 1600    Date of Initial Exercise Prescription   Date 05/14/15   Bike   Level 0.5   Minutes 15  5 min x 3 with rest in between if needed   Recumbant Bike   Level 1   Watts 20   Minutes 15  5 min x 3 with rest in between if needed   NuStep   Level 2   Watts 20   Minutes 15  5 min x 3 with rest in between if needed   Arm Ergometer   Level 1   Watts 8   Minutes 15  5 min x 3 with rest in between if needed   Arm/Foot Ergometer   Level 1   Watts 10   Minutes 15  5 min x 3 with rest in between if needed   Cybex   Level 1   RPM 30   Minutes 15  5 min x 3 with rest in between if needed   Recumbant Elliptical   Level 1   Watts 20   Minutes 15  5 min x 3 with rest in between if needed   REL-XR   Level 1   Watts 20   Minutes 15  5 min x 3 with rest in between if needed   Prescription Details   Frequency (times per week) 3   Duration Progress to 30 minutes of continuous aerobic without signs/symptoms of physical distress   Intensity   THRR REST +  20   Ratings of Perceived Exertion 11-13   Progression Continue progressive overload as per policy without signs/symptoms or physical distress.   Resistance Training   Training Prescription Yes   Weight 1   Reps 10-12      Exercise Prescription Changes:     Exercise Prescription Changes      05/29/15 0600 06/26/15 0700 07/23/15 1400       Exercise Review   Progression No  Has not started exercise; medical review on 05/14/15 No  Has not started exercise; medical review on 05/14/15 No  Has not started exercise; medical review on 05/14/15  Discharge Exercise Prescription (Final Exercise Prescription Changes):     Exercise Prescription Changes - 07/23/15 1400    Exercise Review   Progression No  Has not started exercise; medical review on 05/14/15      Nutrition:  Target Goals: Understanding of nutrition guidelines, daily intake of sodium 1500mg , cholesterol 200mg , calories 30% from fat and 7% or less from saturated fats, daily to have 5 or more servings of fruits and vegetables.  Biometrics:     Pre Biometrics - 05/14/15 1621    Pre Biometrics   Height 5' 1.75" (1.568 m)   Weight 204 lb 12.8 oz (92.897 kg)   Waist Circumference 38.5 inches   Hip Circumference 48 inches   Waist to Hip Ratio 0.8 %   BMI (Calculated) 37.8       Nutrition Therapy Plan and Nutrition Goals:   Nutrition Discharge: Rate Your Plate Scores:   Nutrition Goals Re-Evaluation:   Psychosocial: Target Goals: Acknowledge presence or absence of depression, maximize coping skills, provide positive support system. Participant is able to verbalize types and ability to use techniques and skills needed for reducing stress and depression.  Initial Review & Psychosocial Screening:     Initial Psych Review & Screening - 05/14/15 1905    Initial Review   Current issues with Current Sleep Concerns;Current Stress Concerns   Source of Stress Concerns Financial;Transportation   Family Dynamics   Good Support System? Yes   Comments Children are helping Brittina at this time   Barriers   Psychosocial barriers to participate in program There are no identifiable barriers or psychosocial needs.;The patient should benefit from training in stress management and relaxation.   Screening Interventions   Interventions Encouraged to exercise;Program counselor consult      Quality of Life Scores:     Quality of Life - 05/14/15 1852    Quality of Life Scores   Health/Function Pre 14.57 %   Socioeconomic Pre 23.14 %   Psych/Spiritual Pre 30 %    Family Pre 28.5 %   GLOBAL Pre 21.56 %      PHQ-9:     Recent Review Flowsheet Data    Depression screen [facility] 2/9 05/14/2015 02/01/2015   Decreased Interest 0 0   Down, Depressed, Hopeless 3 0   PHQ - 2 Score 3 0   Altered sleeping 3 -   Tired, decreased energy 3 -   Change in appetite 3 -   Feeling bad or failure about yourself  0 -   Trouble concentrating 3 -   Moving slowly or fidgety/restless 3 -   Suicidal thoughts 0 -   PHQ-9 Score 18 -   Difficult doing work/chores Not difficult at all -      Psychosocial Evaluation and Intervention:   Psychosocial Re-Evaluation:     Psychosocial Re-Evaluation      05/30/15 1845 06/06/15 1154 06/21/15 1415       Psychosocial Re-Evaluation   Interventions Encouraged to attend Cardiac Rehabilitation for the exercise;Stress management education Stress management education Encouraged to attend Cardiac Rehabilitation for the exercise     Comments Afrin has the stressor of just being sick so unable to attend Cardiac Rehab the past 2 days.  Stress of being sick and missing still missing CR.  Has been unable to attend Cardiac Rehab lately and I left her a vm.         Vocational Rehabilitation: Provide vocational rehab assistance to qualifying candidates.   Vocational Rehab Evaluation & Intervention:  Vocational Rehab - 05/14/15 1440    Initial Vocational Rehab Evaluation & Intervention   Assessment shows need for Vocational Rehabilitation No      Education: Education Goals: Education classes will be provided on a weekly basis, covering required topics. Participant will state understanding/return demonstration of topics presented.  Learning Barriers/Preferences:     Learning Barriers/Preferences - 05/14/15 1439    Learning Barriers/Preferences   Learning Barriers Sight   Learning Preferences Group Instruction      Education Topics: General Nutrition Guidelines/Fats and Fiber: -Group instruction provided by verbal,  written material, models and posters to present the general guidelines for heart healthy nutrition. Gives an explanation and review of dietary fats and fiber.   Controlling Sodium/Reading Food Labels: -Group verbal and written material supporting the discussion of sodium use in heart healthy nutrition. Review and explanation with models, verbal and written materials for utilization of the food label.   Exercise Physiology & Risk Factors: - Group verbal and written instruction with models to review the exercise physiology of the cardiovascular system and associated critical values. Details cardiovascular disease risk factors and the goals associated with each risk factor.   Aerobic Exercise & Resistance Training: - Gives group verbal and written discussion on the health impact of inactivity. On the components of aerobic and resistive training programs and the benefits of this training and how to safely progress through these programs.   Flexibility, Balance, General Exercise Guidelines: - Provides group verbal and written instruction on the benefits of flexibility and balance training programs. Provides general exercise guidelines with specific guidelines to those with heart or lung disease. Demonstration and skill practice provided.   Stress Management: - Provides group verbal and written instruction about the health risks of elevated stress, cause of high stress, and healthy ways to reduce stress.   Depression: - Provides group verbal and written instruction on the correlation between heart/lung disease and depressed mood, treatment options, and the stigmas associated with seeking treatment.   Anatomy & Physiology of the Heart: - Group verbal and written instruction and models provide basic cardiac anatomy and physiology, with the coronary electrical and arterial systems. Review of: AMI, Angina, Valve disease, Heart Failure, Cardiac Arrhythmia, Pacemakers, and the ICD.   Cardiac  Procedures: - Group verbal and written instruction and models to describe the testing methods done to diagnose heart disease. Reviews the outcomes of the test results. Describes the treatment choices: Medical Management, Angioplasty, or Coronary Bypass Surgery.   Cardiac Medications: - Group verbal and written instruction to review commonly prescribed medications for heart disease. Reviews the medication, class of the drug, and side effects. Includes the steps to properly store meds and maintain the prescription regimen.   Go Sex-Intimacy & Heart Disease, Get SMART - Goal Setting: - Group verbal and written instruction through game format to discuss heart disease and the return to sexual intimacy. Provides group verbal and written material to discuss and apply goal setting through the application of the S.M.A.R.T. Method.   Other Matters of the Heart: - Provides group verbal, written materials and models to describe Heart Failure, Angina, Valve Disease, and Diabetes in the realm of heart disease. Includes description of the disease process and treatment options available to the cardiac patient.   Exercise & Equipment Safety: - Individual verbal instruction and demonstration of equipment use and safety with use of the equipment.          Cardiac Rehab from 05/14/2015 in [facility] Cardiac Rehab   Date  05/14/15   Educator  Sb   Instruction Review Code  2- meets goals/outcomes      Infection Prevention: - Provides verbal and written material to individual with discussion of infection control including proper hand washing and proper equipment cleaning during exercise session.      Cardiac Rehab from 05/14/2015 in [facility] Cardiac Rehab   Date  05/14/15   Educator  SB   Instruction Review Code  2- meets goals/outcomes      Falls Prevention: - Provides verbal and written material to individual with discussion of falls prevention and safety.      Cardiac Rehab from 05/14/2015 in [facility] Cardiac Rehab    Date  05/14/15   Educator  SB   Instruction Review Code  2- meets goals/outcomes      Diabetes: - Individual verbal and written instruction to review signs/symptoms of diabetes, desired ranges of glucose level fasting, after meals and with exercise. Advice that pre and post exercise glucose checks will be done for 3 sessions at entry of program.    Knowledge Questionnaire Score:     Knowledge Questionnaire Score - 05/14/15 1851    Knowledge Questionnaire Score   Pre Score 19/28      Personal Goals and Risk Factors at Admission:     Personal Goals and Risk Factors at Admission - 05/14/15 1442    Personal Goals and Risk Factors on Admission    Weight Management Yes;Obesity   Intervention Learn and follow the exercise and diet guidelines while in the program. Utilize the nutrition and education classes to help gain knowledge of the diet and exercise expectations in the program   Intervention Provide weight management tools through evaluation completed by registered dietician and exercise physiologist.  Establish a goal weight with participant.   Admit Weight 204 lb (92.534 kg)   Goal Weight 150 lb (68.04 kg)   Increase Aerobic Exercise and Physical Activity Yes   Intervention While in program, learn and follow the exercise prescription taught. Start at a low level workload and increase workload after able to maintain previous level for 30 minutes. Increase time before increasing intensity.   Understand more about Heart/Pulmonary Disease. Yes   Intervention While in program utilize professionals for any questions, and attend the education sessions. Great websites to use are www.americanheart.org or www.lung.org for reliable information.   Diabetes No   Hypertension Yes   Goal Participant will see blood pressure controlled within the values of 140/69mm/Hg or within value directed by their physician.   Intervention Provide nutrition & aerobic exercise along with prescribed  medications to achieve BP 140/90 or less.   Lipids Yes   Goal Cholesterol controlled with medications as prescribed, with individualized exercise RX and with personalized nutrition plan. Value goals: LDL < 70mg , HDL > 40mg . Participant states understanding of desired cholesterol values and following prescriptions.   Intervention Provide nutrition & aerobic exercise along with prescribed medications to achieve LDL 70mg , HDL >40mg .   Stress Yes   Goal To meet with psychosocial counselor for stress and relaxation information and guidance. To state understanding of performing relaxation techniques and or identifying personal stressors.   Intervention Provide education on types of stress, identifiying stressors, and ways to cope with stress. Provide demonstration and active practice of relaxation techniques.      Personal Goals and Risk Factors Review:      Goals and Risk Factor Review      05/30/15 1845 06/21/15 1414  Increase Aerobic Exercise and Physical Activity   Goals Progress/Improvement seen  No No      Comments Unable to attend the past 2 days since she has been out sick.  Has been unable to attend Cardiac Rehab lately and I left her a vm.          Personal Goals Discharge (Final Personal Goals and Risk Factors Review):      Goals and Risk Factor Review - 06/21/15 1414    Increase Aerobic Exercise and Physical Activity   Goals Progress/Improvement seen  No   Comments Has been unable to attend Cardiac Rehab lately and I left her a vm.       ITP Comments:     ITP Comments      07/30/15 1221           ITP Comments 30 day review preparation: Continue with ITP. Shamane has been absent this month. Had procedure done to replace her ICD wire. Plans to return this week.          Comments: Janyla said she didn't feel good today and wants to start back on Dec 27th Wed 8am.

## 2015-08-06 NOTE — Telephone Encounter (Signed)
Claire Mcdonald said she didn't feel good today and wants to start back on Dec 27th Wed 8am.

## 2015-08-08 ENCOUNTER — Ambulatory Visit: Payer: Medicaid Other

## 2015-08-10 ENCOUNTER — Ambulatory Visit: Payer: Medicaid Other

## 2015-08-13 ENCOUNTER — Telehealth: Payer: Self-pay | Admitting: Unknown Physician Specialty

## 2015-08-13 ENCOUNTER — Telehealth: Payer: Self-pay

## 2015-08-13 ENCOUNTER — Ambulatory Visit: Payer: Medicaid Other

## 2015-08-13 ENCOUNTER — Telehealth: Payer: Self-pay | Admitting: Internal Medicine

## 2015-08-13 NOTE — Telephone Encounter (Signed)
Spoke with pt and reviewed recent BMP and CBC results with her.  She reports she saw provider in Dr. Jackalyn Lombard office today.  Pt reports she is being scheduled for a colonoscopy and clearance paperwork is being faxed to Dr. Caryl Comes

## 2015-08-13 NOTE — Telephone Encounter (Signed)
New problem   Pt stated someone called her Friday and told her to call office back and didn't leave there name.

## 2015-08-13 NOTE — Telephone Encounter (Signed)
Prescription was in chart, written on 08/03/15, so I called it in to the pharmacy.

## 2015-08-13 NOTE — Telephone Encounter (Signed)
Pt called and stated that her metoprolol wasn't ready at [facility]

## 2015-08-13 NOTE — Telephone Encounter (Signed)
Called and left patient a message stating that they are getting her prescription ready at the pharmacy now. Asked for her to give [place] a call back if we can do anything else for her.

## 2015-08-13 NOTE — Telephone Encounter (Signed)
Received clearance request from [facility] for colonoscopy and upper endoscopy. Date TBD. Placed in Dr. Olin Pia box for review

## 2015-08-14 NOTE — Telephone Encounter (Signed)
Faxed clearance to Dr. Caryl Comes at the [facility] office.

## 2015-08-15 ENCOUNTER — Encounter: Payer: Medicaid Other | Attending: Unknown Physician Specialty

## 2015-08-21 ENCOUNTER — Encounter: Payer: Self-pay | Admitting: *Deleted

## 2015-08-21 NOTE — Telephone Encounter (Signed)
Requested form from GI to be faxed from [place].

## 2015-08-21 NOTE — Telephone Encounter (Signed)
08-21-15 pt states she is returning a call to [place]

## 2015-08-21 NOTE — Telephone Encounter (Signed)
I called and spoke with the patient. She states that Tammi Klippel from [place] has faxed a request for clearance for the patient twice. I advised the patient I have received no fax to the office here. In reviewing her chart, the fax went to the [place] office and it was sent here on 08/14/15, but I have not received this.  I advised the patient I will call [facility] to get the form, or try to reach the [facility] office to see if they still have a copy of the request.

## 2015-08-22 ENCOUNTER — Telehealth: Payer: Self-pay

## 2015-08-22 NOTE — Telephone Encounter (Signed)
Patient called and stated she had been in the ER last night for sickness, and would not attend today.

## 2015-08-23 NOTE — Telephone Encounter (Signed)
GI form received and signed by Dr. Caryl Comes- faxed to [facility] at [phone].  [facility] phone # [phone]. Confirmation received. I left a message for the patient to call so I can confirm with her that this has been sent and she is ok to proceed with Colonoscopy/ upper Endoscopy.

## 2015-08-26 NOTE — Progress Notes (Signed)
Cardiac Individual Treatment Plan  Patient Details  Name: Claire Mcdonald MRN: [id number] Date of Birth: 1966/06/20 Referring Provider:  Deboraha Sprang, MD  Initial Encounter Date: Date: 05/14/15  Visit Diagnosis: Acute non-ST-elevation myocardial infarction - Plan: CARDIAC REHAB 30 DAY REVIEW, CARDIAC REHAB 30 DAY REVIEW  Patient's Home Medications on Admission:  Current outpatient prescriptions:  .  albuterol (PROVENTIL HFA;VENTOLIN HFA) 108 (90 BASE) MCG/ACT inhaler, Inhale 2 puffs into the lungs every 6 (six) hours as needed for wheezing or shortness of breath., Disp: 1 Inhaler, Rfl: 0 .  DULoxetine (CYMBALTA) 60 MG capsule, Take 60 mg by mouth daily., Disp: , Rfl:  .  topiramate (TOPAMAX) 100 MG tablet, Take 1 tablet (100 mg total) by mouth 2 (two) times daily., Disp: 60 tablet, Rfl: 1 .  apixaban (ELIQUIS) 5 MG TABS tablet, Take 1 tablet (5 mg total) by mouth 2 (two) times daily., Disp: 60 tablet, Rfl: 12 .  atorvastatin (LIPITOR) 20 MG tablet, Take 1 tablet (20 mg total) by mouth daily at 6 PM., Disp: 90 tablet, Rfl: 1 .  dicyclomine (BENTYL) 20 MG tablet, Take 1 tablet (20 mg total) by mouth 3 (three) times daily as needed for spasms., Disp: 30 tablet, Rfl: 0 .  furosemide (LASIX) 20 MG tablet, Take 1 tablet (20 mg total) by mouth daily., Disp: 90 tablet, Rfl: 3 .  HYDROcodone-acetaminophen (NORCO) 5-325 MG tablet, Take 1 tablet by mouth every 6 (six) hours as needed for moderate pain., Disp: 10 tablet, Rfl: 0 .  levETIRAcetam (KEPPRA) 500 MG tablet, Take 1 tablet (500 mg total) by mouth 2 (two) times daily., Disp: 60 tablet, Rfl: 2 .  metoprolol tartrate (LOPRESSOR) 25 MG tablet, Take 0.5 tablets (12.5 mg total) by mouth 2 (two) times daily., Disp: 60 tablet, Rfl: 1 .  omeprazole (PRILOSEC) 20 MG capsule, Take 20 mg by mouth daily., Disp: , Rfl:  .  promethazine (PHENERGAN) 12.5 MG suppository, Place 1 suppository (12.5 mg total) rectally every 6 (six) hours as needed for nausea or  vomiting., Disp: 12 each, Rfl: 0  Past Medical History: Past Medical History  Diagnosis Date  . Hypertension   . High cholesterol   . Anxiety   . Depression   . Allergy   . GERD (gastroesophageal reflux disease)   . Migraine   . Diverticulosis     seen on CT 2015  . Obesity (BMI 30-39.9)   . DVT (deep venous thrombosis) ([place])   . Idiopathic cardiac arrest ([place]) 02/2015    V. fib, EF nl, non-obs CAD; [employer] ICD    Tobacco Use: History  Smoking status  . Never Smoker   Smokeless tobacco  . Never Used    Labs: Recent Review Flowsheet Data    Labs for ITP Cardiac and Pulmonary Rehab Latest Ref Rng 03/25/2015 03/25/2015 03/25/2015 03/26/2015 03/28/2015   Trlycerides <150 mg/dL - 133 - - 93   PHART 7.350 - 7.450 7.31(L) - 7.356 7.346(L) -   PCO2ART 35.0 - 45.0 mmHg 40 - 35.7 34.8(L) -   HCO3 20.0 - 24.0 mEq/L 20.1(L) - 19.3(L) 19.1(L) -   TCO2 0 - 100 mmol/L - - 20.4 20 -   ACIDBASEDEF 0.0 - 2.0 mmol/L 5.8(H) - 5.1(H) 6.0(H) -   O2SAT - 94.9 - 99.3 99.0 -       Exercise Target Goals: Date: 05/14/15  Exercise Program Goal: Individual exercise prescription set with THRR, safety & activity barriers. Participant demonstrates ability to understand and report RPE  using BORG scale, to self-measure pulse accurately, and to acknowledge the importance of the exercise prescription.  Exercise Prescription Goal: Starting with aerobic activity 30 plus minutes a day, 3 days per week for initial exercise prescription. Provide home exercise prescription and guidelines that participant acknowledges understanding prior to discharge.  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 Has received injections in lower back for symptoms. Last time was  June 2016.   Risk Stratification High      6 Minute Walk:     6 Minute Walk      05/14/15 1613       6 Minute Walk   Phase Initial     Distance 500 feet     Walk Time 3 minutes     Resting HR 68 bpm     Resting BP 104/76 mmHg     Max Ex. HR 98 bpm     Max Ex. BP 102/60 mmHg     RPE 9     Symptoms Yes (comment)     Comments No symptoms of pain. Had to stop due to weakness in the legs.         Initial Exercise Prescription:     Initial Exercise Prescription - 05/14/15 1600    Date of Initial Exercise Prescription   Date 05/14/15   Bike   Level 0.5   Minutes 15  5 min x 3 with rest in between if needed   Recumbant Bike   Level 1   Watts 20   Minutes 15  5 min x 3 with rest in between if needed   NuStep   Level 2   Watts 20   Minutes 15  5 min x 3 with rest in between if needed   Arm Ergometer   Level 1   Watts 8   Minutes 15  5 min x 3 with rest in between if needed   Arm/Foot Ergometer   Level 1   Watts 10   Minutes 15  5 min x 3 with rest in between if needed   Cybex   Level 1   RPM 30   Minutes 15  5 min x 3 with rest in between if needed   Recumbant Elliptical   Level 1   Watts 20   Minutes 15  5 min x 3 with rest in between if needed   REL-XR   Level 1   Watts 20   Minutes 15  5 min x 3 with rest in between if needed   Prescription Details   Frequency (times per week) 3   Duration Progress to 30 minutes of continuous aerobic without signs/symptoms of physical distress   Intensity   THRR REST +  20   Ratings of Perceived Exertion 11-13   Progression Continue progressive overload as per policy without signs/symptoms or physical distress.   Resistance Training   Training Prescription Yes   Weight 1   Reps 10-12      Exercise Prescription Changes:     Exercise Prescription Changes      05/29/15 0600 06/26/15 0700 07/23/15 1400 08/21/15 0700     Exercise Review   Progression No  Has not started exercise; medical review on  05/14/15 No  Has not started exercise; medical review  on 05/14/15 No  Has not started exercise; medical review on 05/14/15 No  Has not started exercise; medical review on 05/14/15       Discharge Exercise Prescription (Final Exercise Prescription Changes):     Exercise Prescription Changes - 08/21/15 0700    Exercise Review   Progression No  Has not started exercise; medical review on 05/14/15      Nutrition:  Target Goals: Understanding of nutrition guidelines, daily intake of sodium 1500mg , cholesterol 200mg , calories 30% from fat and 7% or less from saturated fats, daily to have 5 or more servings of fruits and vegetables.  Biometrics:     Pre Biometrics - 05/14/15 1621    Pre Biometrics   Height 5' 1.75" (1.568 m)   Weight 204 lb 12.8 oz (92.897 kg)   Waist Circumference 38.5 inches   Hip Circumference 48 inches   Waist to Hip Ratio 0.8 %   BMI (Calculated) 37.8       Nutrition Therapy Plan and Nutrition Goals:   Nutrition Discharge: Rate Your Plate Scores:   Nutrition Goals Re-Evaluation:   Psychosocial: Target Goals: Acknowledge presence or absence of depression, maximize coping skills, provide positive support system. Participant is able to verbalize types and ability to use techniques and skills needed for reducing stress and depression.  Initial Review & Psychosocial Screening:     Initial Psych Review & Screening - 05/14/15 1905    Initial Review   Current issues with Current Sleep Concerns;Current Stress Concerns   Source of Stress Concerns Financial;Transportation   Family Dynamics   Good Support System? Yes   Comments Children are helping Claire Mcdonald at this time   Barriers   Psychosocial barriers to participate in program There are no identifiable barriers or psychosocial needs.;The patient should benefit from training in stress management and relaxation.   Screening Interventions   Interventions Encouraged to exercise;Program counselor consult       Quality of Life Scores:     Quality of Life - 05/14/15 1852    Quality of Life Scores   Health/Function Pre 14.57 %   Socioeconomic Pre 23.14 %   Psych/Spiritual Pre 30 %   Family Pre 28.5 %   GLOBAL Pre 21.56 %      PHQ-9:     Recent Review Flowsheet Data    Depression screen [facility] 2/9 05/14/2015 02/01/2015   Decreased Interest 0 0   Down, Depressed, Hopeless 3 0   PHQ - 2 Score 3 0   Altered sleeping 3 -   Tired, decreased energy 3 -   Change in appetite 3 -   Feeling bad or failure about yourself  0 -   Trouble concentrating 3 -   Moving slowly or fidgety/restless 3 -   Suicidal thoughts 0 -   PHQ-9 Score 18 -   Difficult doing work/chores Not difficult at all -      Psychosocial Evaluation and Intervention:   Psychosocial Re-Evaluation:     Psychosocial Re-Evaluation      05/30/15 1845 06/06/15 1154 06/21/15 1415       Psychosocial Re-Evaluation   Interventions Encouraged to attend Cardiac Rehabilitation for the exercise;Stress management education Stress management education Encouraged to attend Cardiac Rehabilitation for the exercise     Comments Dasya has the stressor of just being sick so unable to attend Cardiac Rehab the past 2 days.  Stress of being sick and missing still missing CR.  Has been unable to attend Cardiac Rehab lately and I left  her a vm.         Vocational Rehabilitation: Provide vocational rehab assistance to qualifying candidates.   Vocational Rehab Evaluation & Intervention:     Vocational Rehab - 05/14/15 1440    Initial Vocational Rehab Evaluation & Intervention   Assessment shows need for Vocational Rehabilitation No      Education: Education Goals: Education classes will be provided on a weekly basis, covering required topics. Participant will state understanding/return demonstration of topics presented.  Learning Barriers/Preferences:     Learning Barriers/Preferences - 05/14/15 1439    Learning Barriers/Preferences    Learning Barriers Sight   Learning Preferences Group Instruction      Education Topics: General Nutrition Guidelines/Fats and Fiber: -Group instruction provided by verbal, written material, models and posters to present the general guidelines for heart healthy nutrition. Gives an explanation and review of dietary fats and fiber.   Controlling Sodium/Reading Food Labels: -Group verbal and written material supporting the discussion of sodium use in heart healthy nutrition. Review and explanation with models, verbal and written materials for utilization of the food label.   Exercise Physiology & Risk Factors: - Group verbal and written instruction with models to review the exercise physiology of the cardiovascular system and associated critical values. Details cardiovascular disease risk factors and the goals associated with each risk factor.   Aerobic Exercise & Resistance Training: - Gives group verbal and written discussion on the health impact of inactivity. On the components of aerobic and resistive training programs and the benefits of this training and how to safely progress through these programs.   Flexibility, Balance, General Exercise Guidelines: - Provides group verbal and written instruction on the benefits of flexibility and balance training programs. Provides general exercise guidelines with specific guidelines to those with heart or lung disease. Demonstration and skill practice provided.   Stress Management: - Provides group verbal and written instruction about the health risks of elevated stress, cause of high stress, and healthy ways to reduce stress.   Depression: - Provides group verbal and written instruction on the correlation between heart/lung disease and depressed mood, treatment options, and the stigmas associated with seeking treatment.   Anatomy & Physiology of the Heart: - Group verbal and written instruction and models provide basic cardiac anatomy  and physiology, with the coronary electrical and arterial systems. Review of: AMI, Angina, Valve disease, Heart Failure, Cardiac Arrhythmia, Pacemakers, and the ICD.   Cardiac Procedures: - Group verbal and written instruction and models to describe the testing methods done to diagnose heart disease. Reviews the outcomes of the test results. Describes the treatment choices: Medical Management, Angioplasty, or Coronary Bypass Surgery.   Cardiac Medications: - Group verbal and written instruction to review commonly prescribed medications for heart disease. Reviews the medication, class of the drug, and side effects. Includes the steps to properly store meds and maintain the prescription regimen.   Go Sex-Intimacy & Heart Disease, Get SMART - Goal Setting: - Group verbal and written instruction through game format to discuss heart disease and the return to sexual intimacy. Provides group verbal and written material to discuss and apply goal setting through the application of the S.M.A.R.T. Method.   Other Matters of the Heart: - Provides group verbal, written materials and models to describe Heart Failure, Angina, Valve Disease, and Diabetes in the realm of heart disease. Includes description of the disease process and treatment options available to the cardiac patient.   Exercise & Equipment Safety: - Individual verbal instruction and demonstration  of equipment use and safety with use of the equipment.          Cardiac Rehab from 05/14/2015 in [facility] Cardiac and Pulmonary Rehab   Date  05/14/15   Educator  Sb   Instruction Review Code  2- meets goals/outcomes      Infection Prevention: - Provides verbal and written material to individual with discussion of infection control including proper hand washing and proper equipment cleaning during exercise session.      Cardiac Rehab from 05/14/2015 in [facility] Cardiac and Pulmonary Rehab   Date  05/14/15   Educator  SB   Instruction Review Code  2-  meets goals/outcomes      Falls Prevention: - Provides verbal and written material to individual with discussion of falls prevention and safety.      Cardiac Rehab from 05/14/2015 in [facility] Cardiac and Pulmonary Rehab   Date  05/14/15   Educator  SB   Instruction Review Code  2- meets goals/outcomes      Diabetes: - Individual verbal and written instruction to review signs/symptoms of diabetes, desired ranges of glucose level fasting, after meals and with exercise. Advice that pre and post exercise glucose checks will be done for 3 sessions at entry of program.    Knowledge Questionnaire Score:     Knowledge Questionnaire Score - 05/14/15 1851    Knowledge Questionnaire Score   Pre Score 19/28      Personal Goals and Risk Factors at Admission:     Personal Goals and Risk Factors at Admission - 05/14/15 1442    Personal Goals and Risk Factors on Admission    Weight Management Yes;Obesity   Intervention Learn and follow the exercise and diet guidelines while in the program. Utilize the nutrition and education classes to help gain knowledge of the diet and exercise expectations in the program   Intervention Provide weight management tools through evaluation completed by registered dietician and exercise physiologist.  Establish a goal weight with participant.   Admit Weight 204 lb (92.534 kg)   Goal Weight 150 lb (68.04 kg)   Increase Aerobic Exercise and Physical Activity Yes   Intervention While in program, learn and follow the exercise prescription taught. Start at a low level workload and increase workload after able to maintain previous level for 30 minutes. Increase time before increasing intensity.   Understand more about Heart/Pulmonary Disease. Yes   Intervention While in program utilize professionals for any questions, and attend the education sessions. Great websites to use are www.americanheart.org or www.lung.org for reliable information.   Diabetes No   Hypertension  Yes   Goal Participant will see blood pressure controlled within the values of 140/22mm/Hg or within value directed by their physician.   Intervention Provide nutrition & aerobic exercise along with prescribed medications to achieve BP 140/90 or less.   Lipids Yes   Goal Cholesterol controlled with medications as prescribed, with individualized exercise RX and with personalized nutrition plan. Value goals: LDL < 70mg , HDL > 40mg . Participant states understanding of desired cholesterol values and following prescriptions.   Intervention Provide nutrition & aerobic exercise along with prescribed medications to achieve LDL 70mg , HDL >40mg .   Stress Yes   Goal To meet with psychosocial counselor for stress and relaxation information and guidance. To state understanding of performing relaxation techniques and or identifying personal stressors.   Intervention Provide education on types of stress, identifiying stressors, and ways to cope with stress. Provide demonstration and active practice of relaxation techniques.  Personal Goals and Risk Factors Review:      Goals and Risk Factor Review      05/30/15 1845 06/21/15 1414         Increase Aerobic Exercise and Physical Activity   Goals Progress/Improvement seen  No No      Comments Unable to attend the past 2 days since she has been out sick.  Has been unable to attend Cardiac Rehab lately and I left her a vm.          Personal Goals Discharge (Final Personal Goals and Risk Factors Review):      Goals and Risk Factor Review - 06/21/15 1414    Increase Aerobic Exercise and Physical Activity   Goals Progress/Improvement seen  No   Comments Has been unable to attend Cardiac Rehab lately and I left her a vm.       ITP Comments:     ITP Comments      07/30/15 1221 08/26/15 1237         ITP Comments 30 day review preparation: Continue with ITP. Julianah has been absent this month. Had procedure done to replace her ICD wire. Plans to  return this week. Ready for 30 day review.  Continue with ITP  HAs not returned since orientation 05/14/2015. Planned to return 12/28, but called in sick.         Comments:

## 2015-08-29 ENCOUNTER — Encounter: Payer: Medicaid Other | Attending: Unknown Physician Specialty

## 2015-08-29 DIAGNOSIS — I214 Non-ST elevation (NSTEMI) myocardial infarction: Secondary | ICD-10-CM | POA: Insufficient documentation

## 2015-08-29 NOTE — Addendum Note (Signed)
Addended by: Lynford Humphrey on: 08/29/2015 10:40 AM   Modules accepted: Orders

## 2015-09-04 ENCOUNTER — Ambulatory Visit: Payer: Medicaid Other | Admitting: Unknown Physician Specialty

## 2015-09-05 DIAGNOSIS — I214 Non-ST elevation (NSTEMI) myocardial infarction: Secondary | ICD-10-CM

## 2015-09-05 NOTE — Progress Notes (Signed)
Daily Session Note  Patient Details  Name: Claire Mcdonald MRN: [id number] Date of Birth: [birth year] Referring Provider:  Deboraha Sprang, MD  Encounter Date: 09/05/2015  Check In:     Session Check In - 09/05/15 0827    Check-In   Staff Present Heath Lark, RN, BSN, CCRP;Renee Dillard Essex, MS, ACSM CEP, Exercise Physiologist;Dakarri Kessinger, BS, ACSM EP-C, Exercise Physiologist   ER physicians immediately available to respond to emergencies See telemetry face sheet for immediately available ER MD   Medication changes reported     No   Fall or balance concerns reported    No   Warm-up and Cool-down Performed on first and last piece of equipment   VAD Patient? No   Pain Assessment   Currently in Pain? No/denies         Goals Met:  Proper associated with RPD/PD & O2 Sat Exercise tolerated well No report of cardiac concerns or symptoms Strength training completed today  Goals Unmet:  Not Applicable  Goals Comments:    Dr. Emily Filbert is Medical Director for [facility] and [facility].

## 2015-09-07 ENCOUNTER — Encounter: Payer: Medicaid Other | Admitting: *Deleted

## 2015-09-07 DIAGNOSIS — I214 Non-ST elevation (NSTEMI) myocardial infarction: Secondary | ICD-10-CM | POA: Diagnosis not present

## 2015-09-07 NOTE — Progress Notes (Signed)
Daily Session Note  Patient Details  Name: Claire Mcdonald MRN: [id number] Date of Birth: 1965/09/18 Referring Provider:  Deboraha Sprang, MD  Encounter Date: 09/07/2015  Check In:     Session Check In - 09/07/15 1202    Check-In   Staff Present Heath Lark, RN, BSN, CCRP;Stacey Blanch Media, RRT, RCP, Respiratory Griffin Basil, MS, ACSM CEP, Exercise Physiologist   ER physicians immediately available to respond to emergencies See telemetry face sheet for immediately available ER MD   Medication changes reported     No   Fall or balance concerns reported    No   Warm-up and Cool-down Performed on first and last piece of equipment   VAD Patient? No   Pain Assessment   Currently in Pain? No/denies         Goals Met:  Exercise tolerated well No report of cardiac concerns or symptoms  Goals Unmet:  Not Applicable  Goals Comments:   [name] was sore after her first day of exercise. She will work at same or less pace today.   Dr. Emily Filbert is Medical Director for [facility] and [facility].

## 2015-09-12 ENCOUNTER — Encounter: Payer: Self-pay | Admitting: *Deleted

## 2015-09-12 DIAGNOSIS — R1110 Vomiting, unspecified: Secondary | ICD-10-CM | POA: Diagnosis not present

## 2015-09-12 DIAGNOSIS — I10 Essential (primary) hypertension: Secondary | ICD-10-CM | POA: Diagnosis not present

## 2015-09-12 DIAGNOSIS — R51 Headache: Secondary | ICD-10-CM | POA: Diagnosis present

## 2015-09-12 NOTE — ED Notes (Addendum)
Pt to triage via wheelchair.  Pt reports she has a headache for 3 days.  Vomiting x 2 today.  Pt states pain in top of head radiating into back and neck .  No chest pain or sob.   Pt alert and speech is clear.  Pt reports taking tylenol without relief.

## 2015-09-13 ENCOUNTER — Emergency Department
Admission: EM | Admit: 2015-09-13 | Discharge: 2015-09-13 | Discharge status: Against Medical Advice | Payer: Medicaid Other | Attending: Emergency Medicine | Admitting: Emergency Medicine

## 2015-09-13 DIAGNOSIS — E785 Hyperlipidemia, unspecified: Secondary | ICD-10-CM | POA: Diagnosis present

## 2015-09-13 DIAGNOSIS — Z86718 Personal history of other venous thrombosis and embolism: Secondary | ICD-10-CM | POA: Insufficient documentation

## 2015-09-13 DIAGNOSIS — I10 Essential (primary) hypertension: Secondary | ICD-10-CM | POA: Diagnosis present

## 2015-09-13 DIAGNOSIS — I82409 Acute embolism and thrombosis of unspecified deep veins of unspecified lower extremity: Secondary | ICD-10-CM | POA: Diagnosis present

## 2015-09-19 ENCOUNTER — Telehealth: Payer: Self-pay | Admitting: Internal Medicine

## 2015-09-19 NOTE — Telephone Encounter (Addendum)
New message     Pt thinks her defibulator has moved again.  Last time it moved, she had to have surgery. She said "it is feels just like it did before". Please call

## 2015-09-20 NOTE — Telephone Encounter (Signed)
No ring, mailbox full.

## 2015-09-20 NOTE — Progress Notes (Signed)
Cardiac Individual Treatment Plan  Patient Details  Name: Claire Mcdonald MRN: [id number] Date of Birth: 1966/02/10 Referring Provider:  Deboraha Sprang, MD  Initial Encounter Date:    Visit Diagnosis: Acute non-ST-elevation myocardial infarction [facility])  Patient's Home Medications on Admission:  Current outpatient prescriptions:  .  albuterol (PROVENTIL HFA;VENTOLIN HFA) 108 (90 BASE) MCG/ACT inhaler, Inhale 2 puffs into the lungs every 6 (six) hours as needed for wheezing or shortness of breath., Disp: 1 Inhaler, Rfl: 0 .  apixaban (ELIQUIS) 5 MG TABS tablet, Take 1 tablet (5 mg total) by mouth 2 (two) times daily., Disp: 60 tablet, Rfl: 12 .  atorvastatin (LIPITOR) 20 MG tablet, Take 1 tablet (20 mg total) by mouth daily at 6 PM., Disp: 90 tablet, Rfl: 1 .  dicyclomine (BENTYL) 20 MG tablet, Take 1 tablet (20 mg total) by mouth 3 (three) times daily as needed for spasms., Disp: 30 tablet, Rfl: 0 .  DULoxetine (CYMBALTA) 60 MG capsule, Take 60 mg by mouth daily., Disp: , Rfl:  .  furosemide (LASIX) 20 MG tablet, Take 1 tablet (20 mg total) by mouth daily., Disp: 90 tablet, Rfl: 3 .  HYDROcodone-acetaminophen (NORCO) 5-325 MG tablet, Take 1 tablet by mouth every 6 (six) hours as needed for moderate pain., Disp: 10 tablet, Rfl: 0 .  levETIRAcetam (KEPPRA) 500 MG tablet, Take 1 tablet (500 mg total) by mouth 2 (two) times daily., Disp: 60 tablet, Rfl: 2 .  metoprolol tartrate (LOPRESSOR) 25 MG tablet, Take 0.5 tablets (12.5 mg total) by mouth 2 (two) times daily., Disp: 60 tablet, Rfl: 1 .  omeprazole (PRILOSEC) 20 MG capsule, Take 20 mg by mouth daily., Disp: , Rfl:  .  promethazine (PHENERGAN) 12.5 MG suppository, Place 1 suppository (12.5 mg total) rectally every 6 (six) hours as needed for nausea or vomiting., Disp: 12 each, Rfl: 0 .  topiramate (TOPAMAX) 100 MG tablet, Take 1 tablet (100 mg total) by mouth 2 (two) times daily., Disp: 60 tablet, Rfl: 1  Past Medical History: Past Medical  History  Diagnosis Date  . Hypertension   . High cholesterol   . Anxiety   . Depression   . Allergy   . GERD (gastroesophageal reflux disease)   . Migraine   . Diverticulosis     seen on CT 2015  . Obesity (BMI 30-39.9)   . DVT (deep venous thrombosis) ([place])   . Idiopathic cardiac arrest ([place]) 02/2015    V. fib, EF nl, non-obs CAD; [employer] ICD    Tobacco Use: History  Smoking status  . Never Smoker   Smokeless tobacco  . Never Used    Labs: Recent Review Flowsheet Data    Labs for ITP Cardiac and Pulmonary Rehab Latest Ref Rng 03/25/2015 03/25/2015 03/25/2015 03/26/2015 03/28/2015   Trlycerides <150 mg/dL - 133 - - 93   PHART 7.350 - 7.450 7.31(L) - 7.356 7.346(L) -   PCO2ART 35.0 - 45.0 mmHg 40 - 35.7 34.8(L) -   HCO3 20.0 - 24.0 mEq/L 20.1(L) - 19.3(L) 19.1(L) -   TCO2 0 - 100 mmol/L - - 20.4 20 -   ACIDBASEDEF 0.0 - 2.0 mmol/L 5.8(H) - 5.1(H) 6.0(H) -   O2SAT - 94.9 - 99.3 99.0 -       Exercise Target Goals:    Exercise Program Goal: Individual exercise prescription set with THRR, safety & activity barriers. Participant demonstrates ability to understand and report RPE using BORG scale, to self-measure pulse accurately, and to acknowledge the  importance of the exercise prescription.  Exercise Prescription Goal: Starting with aerobic activity 30 plus minutes a day, 3 days per week for initial exercise prescription. Provide home exercise prescription and guidelines that participant acknowledges understanding prior to discharge.  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 Has received injections in lower back for symptoms. Last time was June 2016.   Risk Stratification High      6 Minute Walk:     6 Minute Walk       05/14/15 1613       6 Minute Walk   Phase Initial     Distance 500 feet     Walk Time 3 minutes     Resting HR 68 bpm     Resting BP 104/76 mmHg     Max Ex. HR 98 bpm     Max Ex. BP 102/60 mmHg     RPE 9     Symptoms Yes (comment)     Comments No symptoms of pain. Had to stop due to weakness in the legs.         Initial Exercise Prescription:     Initial Exercise Prescription - 05/14/15 1600    Date of Initial Exercise Prescription   Date 05/14/15   Bike   Level 0.5   Minutes 15  5 min x 3 with rest in between if needed   Recumbant Bike   Level 1   Watts 20   Minutes 15  5 min x 3 with rest in between if needed   NuStep   Level 2   Watts 20   Minutes 15  5 min x 3 with rest in between if needed   Arm Ergometer   Level 1   Watts 8   Minutes 15  5 min x 3 with rest in between if needed   Arm/Foot Ergometer   Level 1   Watts 10   Minutes 15  5 min x 3 with rest in between if needed   Cybex   Level 1   RPM 30   Minutes 15  5 min x 3 with rest in between if needed   Recumbant Elliptical   Level 1   Watts 20   Minutes 15  5 min x 3 with rest in between if needed   REL-XR   Level 1   Watts 20   Minutes 15  5 min x 3 with rest in between if needed   Prescription Details   Frequency (times per week) 3   Duration Progress to 30 minutes of continuous aerobic without signs/symptoms of physical distress   Intensity   THRR REST +  20   Ratings of Perceived Exertion 11-13   Progression Continue progressive overload as per policy without signs/symptoms or physical distress.   Resistance Training   Training Prescription Yes   Weight 1   Reps 10-12      Exercise Prescription Changes:     Exercise Prescription Changes      05/29/15 0600 06/26/15 0700 07/23/15 1400 08/21/15 0700 09/05/15 1100   Exercise Review   Progression No  Has not started exercise; medical review on 05/14/15 No  Has not started exercise; medical review on 05/14/15 No  Has not  started exercise; medical review on  05/14/15 No  Has not started exercise; medical review on 05/14/15    Response to Exercise   Symptoms     None   Comments     First day of exercise! Patient was oriented to the gym and the equipment functions and settings. Procedures and policies of the gym were outlined and explained. The patient's individual exercise prescription and treatment plan were reviewed with them. All starting workloads were established based on the results of the functional testing  done at the initial intake visit. The plan for exercise progression was also introduced and progression will be customized based on the patient's performance and goals. There has been a several month gap between this visit and Tesslyn's medical review. Due to this, her ex. Rx. Was reviewed and special care was taken during her exercise to insure the workloads were manageable.   Duration     Progress to 30 minutes of continuous aerobic without signs/symptoms of physical distress   Intensity     Other (comment)  Rest + 20   Progression     Continue progressive overload as per policy without signs/symptoms or physical distress.   Resistance Training   Training Prescription     Yes   Weight     2   Reps     10-15   NuStep   Level     2   Watts     35   Minutes     20     09/07/15 0746           Exercise Review   Progression Yes       Response to Exercise   Blood Pressure (Admit) 140/80 mmHg       Blood Pressure (Exercise) 144/88 mmHg       Blood Pressure (Exit) 148/88 mmHg       Heart Rate (Admit) 113 bpm       Heart Rate (Exercise) 123 bpm       Heart Rate (Exit) 96 bpm       Rating of Perceived Exertion (Exercise) 9       Symptoms None       Comments Made slight progression with exercise goals. [place] only game twice during this 30 day review. This inconsistent attendance makes progression difficult. We will follow up with her at her next visit on barriers to coming to class and what we can do to  help.        Duration Progress to 30 minutes of continuous aerobic without signs/symptoms of physical distress       Intensity Other (comment)  Rest + 20       Progression Continue progressive overload as per policy without signs/symptoms or physical distress.       Resistance Training   Training Prescription Yes       Weight 2       Reps 10-15       Interval Training   Interval Training No       NuStep   Level 2       Watts 35       Minutes 20       Biostep-RELP   Level 2       Watts 25       Minutes 15          Discharge Exercise Prescription (Final Exercise Prescription Changes):     Exercise Prescription Changes - 09/07/15 0746    Exercise Review   Progression Yes  Response to Exercise   Blood Pressure (Admit) 140/80 mmHg   Blood Pressure (Exercise) 144/88 mmHg   Blood Pressure (Exit) 148/88 mmHg   Heart Rate (Admit) 113 bpm   Heart Rate (Exercise) 123 bpm   Heart Rate (Exit) 96 bpm   Rating of Perceived Exertion (Exercise) 9   Symptoms None   Comments Made slight progression with exercise goals. [place] only game twice during this 30 day review. This inconsistent attendance makes progression difficult. We will follow up with her at her next visit on barriers to coming to class and what we can do to help.    Duration Progress to 30 minutes of continuous aerobic without signs/symptoms of physical distress   Intensity Other (comment)  Rest + 20   Progression Continue progressive overload as per policy without signs/symptoms or physical distress.   Resistance Training   Training Prescription Yes   Weight 2   Reps 10-15   Interval Training   Interval Training No   NuStep   Level 2   Watts 35   Minutes 20   Biostep-RELP   Level 2   Watts 25   Minutes 15      Nutrition:  Target Goals: Understanding of nutrition guidelines, daily intake of sodium <1532m, cholesterol <2075m calories 30% from fat and 7% or less from saturated fats, daily to have 5 or more  servings of fruits and vegetables.  Biometrics:     Pre Biometrics - 05/14/15 1621    Pre Biometrics   Height 5' 1.75" (1.568 m)   Weight 204 lb 12.8 oz (92.897 kg)   Waist Circumference 38.5 inches   Hip Circumference 48 inches   Waist to Hip Ratio 0.8 %   BMI (Calculated) 37.8       Nutrition Therapy Plan and Nutrition Goals:   Nutrition Discharge: Rate Your Plate Scores:   Nutrition Goals Re-Evaluation:   Psychosocial: Target Goals: Acknowledge presence or absence of depression, maximize coping skills, provide positive support system. Participant is able to verbalize types and ability to use techniques and skills needed for reducing stress and depression.  Initial Review & Psychosocial Screening:     Initial Psych Review & Screening - 05/14/15 1905    Initial Review   Current issues with Current Sleep Concerns;Current Stress Concerns   Source of Stress Concerns Financial;Transportation   Family Dynamics   Good Support System? Yes   Comments Children are helping [employer] this time   Barriers   Psychosocial barriers to participate in program There are no identifiable barriers or psychosocial needs.;The patient should benefit from training in stress management and relaxation.   Screening Interventions   Interventions Encouraged to exercise;Program counselor consult      Quality of Life Scores:     Quality of Life - 05/14/15 1852    Quality of Life Scores   Health/Function Pre 14.57 %   Socioeconomic Pre 23.14 %   Psych/Spiritual Pre 30 %   Family Pre 28.5 %   GLOBAL Pre 21.56 %      PHQ-9:     Recent Review Flowsheet Data    [facility] 05/14/2015 02/01/2015   Decreased Interest 0 0   Down, Depressed, Hopeless 3 0   PHQ - 2 Score 3 0   Altered sleeping 3 -   Tired, decreased energy 3 -   Change in appetite 3 -   Feeling bad or failure about yourself  0 -   Trouble concentrating 3 -  Moving slowly or fidgety/restless 3 -   Suicidal  thoughts 0 -   PHQ-9 Score 18 -   Difficult doing work/chores Not difficult at all -      Psychosocial Evaluation and Intervention:     Psychosocial Evaluation - 09/05/15 0949    Psychosocial Evaluation & Interventions   Comments Counselor met with Ms. Politano today for initial psychosocial evaluation.  She is a 50 year old female with multiple health issues, including chronic pain, a seizure disorder, an upcoming colonoscopy and possible hysterectomy on top of her recent cardiac arrest (7/31).  Ms. Noyce has two adult children living in her home who help care for her, as well as a father and significant other close by, so she has a strong support system.  She reports a long history of not sleeping well  and currently states she does not have much appetite.   She reports a history of depression and anxiety and is currently on medications for this that are currently not working well as her PHQ-9 scores were 18, which is concerning.  Ms. Molden has multiple stressors currently with health issues and financial strain due to not being able to work.  Also, parenting is stressful for her at this time.  Ms. Trefz is unable to drive for 6 months due to her seizure disorder.  She has goals for this program to lose weight and get healthier in general - hopefully get back to work soon.  She has home gym equipment that she will begin using once she is done with this program or even on the off days.  Counselor recommended that Ms. Potier speak with her Dr. about her current anti-depressant medications not working, as well as possibly considering ordering a sleep study due to her long history of not sleeping well.  She also mentioned addressing her chronic pain would be helpful, since this impacts her mood and ability to sleep.  Counselor also recommended Ms. Grajeda see the dietician for help addressing her weight loss goals.       Psychosocial Re-Evaluation:     Psychosocial Re-Evaluation      05/30/15 1845 06/06/15 1154  06/21/15 1415       Psychosocial Re-Evaluation   Interventions Encouraged to attend Cardiac Rehabilitation for the exercise;Stress management education Stress management education Encouraged to attend Cardiac Rehabilitation for the exercise     Comments Brilynn has the stressor of just being sick so unable to attend Cardiac Rehab the past 2 days.  Stress of being sick and missing still missing CR.  Has been unable to attend Cardiac Rehab lately and I left her a vm.         Vocational Rehabilitation: Provide vocational rehab assistance to qualifying candidates.   Vocational Rehab Evaluation & Intervention:     Vocational Rehab - 05/14/15 1440    Initial Vocational Rehab Evaluation & Intervention   Assessment shows need for Vocational Rehabilitation No      Education: Education Goals: Education classes will be provided on a weekly basis, covering required topics. Participant will state understanding/return demonstration of topics presented.  Learning Barriers/Preferences:     Learning Barriers/Preferences - 05/14/15 1439    Learning Barriers/Preferences   Learning Barriers Sight   Learning Preferences Group Instruction      Education Topics: General Nutrition Guidelines/Fats and Fiber: -Group instruction provided by verbal, written material, models and posters to present the general guidelines for heart healthy nutrition. Gives an explanation and review of dietary fats and fiber.  Controlling Sodium/Reading Food Labels: -Group verbal and written material supporting the discussion of sodium use in heart healthy nutrition. Review and explanation with models, verbal and written materials for utilization of the food label.   Exercise Physiology & Risk Factors: - Group verbal and written instruction with models to review the exercise physiology of the cardiovascular system and associated critical values. Details cardiovascular disease risk factors and the goals associated with each  risk factor.   Aerobic Exercise & Resistance Training: - Gives group verbal and written discussion on the health impact of inactivity. On the components of aerobic and resistive training programs and the benefits of this training and how to safely progress through these programs.   Flexibility, Balance, General Exercise Guidelines: - Provides group verbal and written instruction on the benefits of flexibility and balance training programs. Provides general exercise guidelines with specific guidelines to those with heart or lung disease. Demonstration and skill practice provided.   Stress Management: - Provides group verbal and written instruction about the health risks of elevated stress, cause of high stress, and healthy ways to reduce stress.          Cardiac Rehab from 09/05/2015 in [facility] Cardiac and Pulmonary Rehab   Date  09/05/15   Educator  [facility]   Instruction Review Code  2- meets goals/outcomes      Depression: - Provides group verbal and written instruction on the correlation between heart/lung disease and depressed mood, treatment options, and the stigmas associated with seeking treatment.   Anatomy & Physiology of the Heart: - Group verbal and written instruction and models provide basic cardiac anatomy and physiology, with the coronary electrical and arterial systems. Review of: AMI, Angina, Valve disease, Heart Failure, Cardiac Arrhythmia, Pacemakers, and the ICD.   Cardiac Procedures: - Group verbal and written instruction and models to describe the testing methods done to diagnose heart disease. Reviews the outcomes of the test results. Describes the treatment choices: Medical Management, Angioplasty, or Coronary Bypass Surgery.   Cardiac Medications: - Group verbal and written instruction to review commonly prescribed medications for heart disease. Reviews the medication, class of the drug, and side effects. Includes the steps to properly store meds and maintain the  prescription regimen.   Go Sex-Intimacy & Heart Disease, Get SMART - Goal Setting: - Group verbal and written instruction through game format to discuss heart disease and the return to sexual intimacy. Provides group verbal and written material to discuss and apply goal setting through the application of the S.M.A.R.T. Method.   Other Matters of the Heart: - Provides group verbal, written materials and models to describe Heart Failure, Angina, Valve Disease, and Diabetes in the realm of heart disease. Includes description of the disease process and treatment options available to the cardiac patient.   Exercise & Equipment Safety: - Individual verbal instruction and demonstration of equipment use and safety with use of the equipment.      Cardiac Rehab from 09/05/2015 in [facility] Cardiac and Pulmonary Rehab   Date  05/14/15   Educator  Sb   Instruction Review Code  2- meets goals/outcomes      Infection Prevention: - Provides verbal and written material to individual with discussion of infection control including proper hand washing and proper equipment cleaning during exercise session.      Cardiac Rehab from 09/05/2015 in [facility] Cardiac and Pulmonary Rehab   Date  05/14/15   Educator  SB   Instruction Review Code  2- meets goals/outcomes  Falls Prevention: - Provides verbal and written material to individual with discussion of falls prevention and safety.      Cardiac Rehab from 09/05/2015 in [facility] Cardiac and Pulmonary Rehab   Date  05/14/15   Educator  SB   Instruction Review Code  2- meets goals/outcomes      Diabetes: - Individual verbal and written instruction to review signs/symptoms of diabetes, desired ranges of glucose level fasting, after meals and with exercise. Advice that pre and post exercise glucose checks will be done for 3 sessions at entry of program.    Knowledge Questionnaire Score:     Knowledge Questionnaire Score - 05/14/15 1851    Knowledge  Questionnaire Score   Pre Score 19/28      Personal Goals and Risk Factors at Admission:     Personal Goals and Risk Factors at Admission - 05/14/15 1442    Personal Goals and Risk Factors on Admission    Weight Management Yes;Obesity   Intervention Learn and follow the exercise and diet guidelines while in the program. Utilize the nutrition and education classes to help gain knowledge of the diet and exercise expectations in the program   Intervention Provide weight management tools through evaluation completed by registered dietician and exercise physiologist.  Establish a goal weight with participant.   Admit Weight 204 lb (92.534 kg)   Goal Weight 150 lb (68.04 kg)   Increase Aerobic Exercise and Physical Activity Yes   Intervention While in program, learn and follow the exercise prescription taught. Start at a low level workload and increase workload after able to maintain previous level for 30 minutes. Increase time before increasing intensity.   Understand more about Heart/Pulmonary Disease. Yes   Intervention While in program utilize professionals for any questions, and attend the education sessions. Great websites to use are www.americanheart.org or www.lung.org for reliable information.   Diabetes No   Hypertension Yes   Goal Participant will see blood pressure controlled within the values of 140/35m/Hg or within value directed by their physician.   Intervention Provide nutrition & aerobic exercise along with prescribed medications to achieve BP 140/90 or less.   Lipids Yes   Goal Cholesterol controlled with medications as prescribed, with individualized exercise RX and with personalized nutrition plan. Value goals: LDL < 721m HDL > 4033mParticipant states understanding of desired cholesterol values and following prescriptions.   Intervention Provide nutrition & aerobic exercise along with prescribed medications to achieve LDL <90m43mDL >40mg82mStress Yes   Goal To meet with  psychosocial counselor for stress and relaxation information and guidance. To state understanding of performing relaxation techniques and or identifying personal stressors.   Intervention Provide education on types of stress, identifiying stressors, and ways to cope with stress. Provide demonstration and active practice of relaxation techniques.      Personal Goals and Risk Factors Review:      Goals and Risk Factor Review      05/30/15 1845 06/21/15 1414 09/11/15 0751       Increase Aerobic Exercise and Physical Activity   Goals Progress/Improvement seen  No No Yes     Comments Unable to attend the past 2 days since she has been out sick.  Has been unable to attend Cardiac Rehab lately and I left her a vm.  Made slight progression with exercise goals. Courtne Anaid game twice during this 30 day review. This inconsistent attendance makes progression difficult. We will follow up with her at her next visit  on barriers to coming to class and what we can do to help.         Personal Goals Discharge (Final Personal Goals and Risk Factors Review):      Goals and Risk Factor Review - 09/11/15 0751    Increase Aerobic Exercise and Physical Activity   Goals Progress/Improvement seen  Yes   Comments Made slight progression with exercise goals. Desi only game twice during this 30 day review. This inconsistent attendance makes progression difficult. We will follow up with her at her next visit on barriers to coming to class and what we can do to help.       ITP Comments:     ITP Comments      07/30/15 1221 08/26/15 1237 09/20/15 1138       ITP Comments 30 day review preparation: Continue with ITP. Sheba has been absent this month. Had procedure done to replace her ICD wire. Plans to return this week. Ready for 30 day review.  Continue with ITP  HAs not returned since orientation 05/14/2015. Planned to return 12/28, but called in sick. Ready for 30 day review. Continue with ITP. Minnette has attended 2  sessions since last review.        Comments:

## 2015-09-20 NOTE — Telephone Encounter (Signed)
Pt scheduled to see Dr. Caryl Comes 09/21/15 at 8:15am to evaluate site.

## 2015-09-20 NOTE — Telephone Encounter (Signed)
Pt concerned because device turns on its side when she lays on either side (3 times)- she reports that it lays back down right afterwards. Denies swelling, drainage or redness at device site. She says that her left arm is painful and the device site feels warm. No fever but she says she has had chills recently. She was due to follow up with Dr. Caryl Comes at the end of December for fluid overload and pain management. Will work with our scheduler to see if we can work her in tomorrow to see Dr. Caryl Comes- if not then she can be scheduled on device clinic. Made pt aware there were no alerts on [facility] for device function (monitor updated 09-19-15). She is agreeable with plan.

## 2015-09-21 ENCOUNTER — Ambulatory Visit
Admission: RE | Admit: 2015-09-21 | Discharge: 2015-09-21 | Disposition: A | Discharge status: Home / Self Care | Payer: Medicaid Other | Source: Ambulatory Visit | Attending: Internal Medicine | Admitting: Internal Medicine

## 2015-09-21 ENCOUNTER — Ambulatory Visit (INDEPENDENT_AMBULATORY_CARE_PROVIDER_SITE_OTHER): Payer: Medicaid Other | Admitting: Internal Medicine

## 2015-09-21 ENCOUNTER — Encounter: Payer: Self-pay | Admitting: Internal Medicine

## 2015-09-21 ENCOUNTER — Telehealth: Payer: Self-pay | Admitting: Internal Medicine

## 2015-09-21 ENCOUNTER — Ambulatory Visit (INDEPENDENT_AMBULATORY_CARE_PROVIDER_SITE_OTHER): Payer: Medicaid Other

## 2015-09-21 ENCOUNTER — Other Ambulatory Visit: Payer: Self-pay

## 2015-09-21 VITALS — BP 110/70 | HR 54 | Ht 62.0 in | Wt 205.6 lb

## 2015-09-21 DIAGNOSIS — R0602 Shortness of breath: Secondary | ICD-10-CM

## 2015-09-21 DIAGNOSIS — I5040 Unspecified combined systolic (congestive) and diastolic (congestive) heart failure: Secondary | ICD-10-CM

## 2015-09-21 DIAGNOSIS — Z959 Presence of cardiac and vascular implant and graft, unspecified: Secondary | ICD-10-CM

## 2015-09-21 DIAGNOSIS — I469 Cardiac arrest, cause unspecified: Secondary | ICD-10-CM

## 2015-09-21 DIAGNOSIS — Z95818 Presence of other cardiac implants and grafts: Secondary | ICD-10-CM

## 2015-09-21 LAB — BASIC METABOLIC PANEL
BUN: 19 mg/dL (ref 7–25)
CHLORIDE: 103 mmol/L (ref 98–110)
CO2: 26 mmol/L (ref 20–31)
Calcium: 9 mg/dL (ref 8.6–10.2)
Creat: 0.81 mg/dL (ref 0.50–1.10)
GLUCOSE: 99 mg/dL (ref 65–99)
Potassium: 3.7 mmol/L (ref 3.5–5.3)
Sodium: 139 mmol/L (ref 135–146)

## 2015-09-21 MED ORDER — FUROSEMIDE 20 MG PO TABS: ORAL_TABLET | ORAL | Status: AC

## 2015-09-21 NOTE — Progress Notes (Signed)
Patient Care Team: [name], NP as PCP - General (Nurse Practitioner)   HPI  Claire Mcdonald is a 50 y.o. female Seen in follow-up for cardiac arrest. Catheterization demonstrated no coronary disease echo showed normal LV function there is no family history.  Cardiac MRI had demonstrated a prior subendocardial infarct flecainide challenge was negative she underwent(SK)  ICD implantation-St. Jude.  This was complicated by lead dislodgement and replaced WC  Now comes in with sensation of device flipping   She also has complaints that her left arm is swollen. Notably she has a history of a DVT and is currently on apixaban.   She also has complaints of pain and restricted motion of her left arm.  She continues with atypical chest pain.  Records and Results Reviewed   Past Medical History  Diagnosis Date  . Hypertension   . High cholesterol   . Anxiety   . Depression   . Allergy   . GERD (gastroesophageal reflux disease)   . Migraine   . Diverticulosis     seen on CT 2015  . Obesity (BMI 30-39.9)   . DVT (deep venous thrombosis) ([place])   . 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 fib, EF nl, non-obs CAD; St. Jude ICD    Past Surgical History  Procedure Laterality Date  . Tubal ligation  1998  . Ovarian cyst removal Left   . Central line  03/25/2015       . Hand surgery Left   . Cardiac catheterization N/A 03/27/2015    Procedure: Left Heart Cath and Coronary Angiography;  Surgeon: Troy Sine, MD;  LAD 10%/30%, CFX and RCA OK, EF normal  . Ep implantable device N/A 04/11/2015    Procedure: ICD Implant;  Surgeon: Deboraha Sprang, MD;  [facility] ICD, serial number [id number]  . Ep implantable device N/A 07/16/2015    Procedure: Lead Revision/Repair;  Surgeon: Will Meredith Leeds, MD;  Location: [place] [facility];  Service: Cardiovascular;  Laterality: N/A;    Current Outpatient Prescriptions  Medication Sig Dispense Refill  . albuterol (PROVENTIL HFA;VENTOLIN  HFA) 108 (90 BASE) MCG/ACT inhaler Inhale 2 puffs into the lungs every 6 (six) hours as needed for wheezing or shortness of breath. 1 Inhaler 0  . apixaban (ELIQUIS) 5 MG TABS tablet Take 1 tablet (5 mg total) by mouth 2 (two) times daily. 60 tablet 12  . atorvastatin (LIPITOR) 20 MG tablet Take 1 tablet (20 mg total) by mouth daily at 6 PM. 90 tablet 1  . dicyclomine (BENTYL) 20 MG tablet Take 1 tablet (20 mg total) by mouth 3 (three) times daily as needed for spasms. 30 tablet 0  . DULoxetine (CYMBALTA) 60 MG capsule Take 60 mg by mouth daily.    . furosemide (LASIX) 20 MG tablet Take 1 tablet (20 mg total) by mouth daily. 90 tablet 3  . HYDROcodone-acetaminophen (NORCO) 5-325 MG tablet Take 1 tablet by mouth every 6 (six) hours as needed for moderate pain. 10 tablet 0  . levETIRAcetam (KEPPRA) 500 MG tablet Take 1 tablet (500 mg total) by mouth 2 (two) times daily. 60 tablet 2  . metoprolol tartrate (LOPRESSOR) 25 MG tablet Take 0.5 tablets (12.5 mg total) by mouth 2 (two) times daily. 60 tablet 1  . omeprazole (PRILOSEC) 20 MG capsule Take 20 mg by mouth daily.    . promethazine (PHENERGAN) 12.5 MG suppository Place 1 suppository (12.5 mg total) rectally every 6 (  six) hours as needed for nausea or vomiting. 12 each 0  . topiramate (TOPAMAX) 100 MG tablet Take 1 tablet (100 mg total) by mouth 2 (two) times daily. 60 tablet 1   No current facility-administered medications for this visit.    Allergies  Allergen Reactions  . Toradol [Ketorolac Tromethamine] Itching and Rash  . Tramadol Itching and Rash  . Zofran [Ondansetron Hcl] Itching and Rash      Review of Systems negative except from HPI and PMH  Physical Exam LMP 09/12/2015 (Exact Date) Well developed and well nourished in no acute distress HENT normal E scleral and icterus clear Neck Supple JVP flat; carotids brisk and full Clear to ausculation Device pocket well healed; without hematoma or erythema.  There is no tethering  *Regular rate and rhythm, no murmurs gallops or rub Soft with active bowel sounds No clubbing cyanosis  Edema??significant aidposity There is some swelling of her left forearm. The left upper arm is not swollen compared to the right. Alert and oriented, grossly normal motor and sensory function Skin Warm and Dry Affect flat-sad  Sinus rhythm at 64 Intervals 15/08/39  Assessment and  Plan  Aborted cardiac arrest  Hypertension  Atypical chest pain  Adhesive capsulitis  Lower extremity swelling  Generator problem  The patient feels like her generator is flipping. She is large and somewhat loose tissue. It is these people and whom I think device motion is a greater problem. I expect, based on the operator, that there was no anchoring suture. We will check a chest x-ray to see if we Identify full rotations.  She her mother both incur lower extremities are swollen. There is clearly a great deal of adiposity. There is some restrictive line from her son. Her weight is down about 10-20 pounds over the last year. Her neck veins are flat. I am not sure how much of this is cardiogenic edema. We will undertake a 3 day diuretic trial to see if we can make a difference.  We will check  echocardiogram given her problems with dyspnea.  I have reviewed with her exercises for adhesive capsulitis. We will plan to re-evaluate her in about 4 weeks.

## 2015-09-21 NOTE — Patient Instructions (Signed)
Medication Instructions: Your physician has recommended you make the following change in your medication:  1. Increase you Furosemide to 40 mg twice daily for 3 days then return to 20 mg daily   Labwork: Your physician recommends that you return for lab work today: BNP/BMP    Procedures/Testing: Your physician has requested that you have an echocardiogram. Echocardiography is a painless test that uses sound waves to create images of your heart. It provides your doctor with information about the size and shape of your heart and how well your heart's chambers and valves are working. This procedure takes approximately one hour. There are no restrictions for this procedure.  A chest x-ray takes a picture of the organs and structures inside the chest, including the heart, lungs, and blood vessels. This test can show several things, including, whether the heart is enlarges; whether fluid is building up in the lungs; and whether pacemaker / defibrillator leads are still in place.   Follow-Up: Your physician recommends that you schedule a follow-up appointment in 4 weeks with Chanetta Marshall, NP . Your physician wants you to follow-up in: 10 months with Dr. Gari Crown will receive a reminder letter in the mail two months in advance. If you don't receive a letter, please call our office to schedule the follow-up appointment.    Any Additional Special Instructions Will Be Listed Below (If Applicable).  Please the office after finishing the increased dose of furosemide.    If you need a refill on your cardiac medications before your next appointment, please call your pharmacy.

## 2015-09-21 NOTE — Telephone Encounter (Signed)
F/u  Pt following up on test results- from appt today 1/27. Please call back and discuss.

## 2015-09-21 NOTE — Telephone Encounter (Signed)
Labs still pending at this time. Chest X-ray has resulted to Dr. Daine Gip, but not reviewed yet. Will review with MD prior to calling the patient back.

## 2015-09-21 NOTE — Telephone Encounter (Signed)
Mrs. Shafer is calling to get her xray results

## 2015-09-22 LAB — BRAIN NATRIURETIC PEPTIDE: Brain Natriuretic Peptide: 20.8 pg/mL (ref 0.0–100.0)

## 2015-09-25 NOTE — Telephone Encounter (Signed)
Per Dr. Caryl Comes, he called and spoke with the patient regarding her chest x-ray and echo results on Friday.

## 2015-09-26 ENCOUNTER — Encounter: Payer: Medicaid Other | Attending: Internal Medicine

## 2015-09-26 DIAGNOSIS — I214 Non-ST elevation (NSTEMI) myocardial infarction: Secondary | ICD-10-CM | POA: Insufficient documentation

## 2015-09-26 NOTE — Addendum Note (Signed)
Addended by: Lynford Humphrey on: 09/26/2015 07:13 AM   Modules accepted: Orders

## 2015-09-27 ENCOUNTER — Telehealth: Payer: Self-pay | Admitting: Internal Medicine

## 2015-09-27 NOTE — Telephone Encounter (Signed)
error 

## 2015-09-28 ENCOUNTER — Encounter: Payer: Self-pay | Admitting: *Deleted

## 2015-09-28 ENCOUNTER — Telehealth: Payer: Self-pay | Admitting: Internal Medicine

## 2015-09-28 NOTE — Telephone Encounter (Signed)
Notified of lab results.  Had questions as to how she was to take her Lasix.  Advised according to office note she was to take Lasix 40 mg BID x 3 days then 20 mg daily.  States she wanted Dr. Caryl Comes to be aware that she was still having the pain in her chest that she mentioned at office visit.  Reminded her of appointment 3/1 with Amber Seiler,NP.

## 2015-09-28 NOTE — Telephone Encounter (Signed)
New message ° ° ° ° °Returning a call to the nurse °

## 2015-10-01 ENCOUNTER — Ambulatory Visit
Admission: RE | Admit: 2015-10-01 | Discharge: 2015-10-01 | Disposition: A | Discharge status: Home / Self Care | Payer: Medicaid Other | Source: Ambulatory Visit | Attending: Gastroenterology | Admitting: Gastroenterology

## 2015-10-01 ENCOUNTER — Ambulatory Visit: Payer: Medicaid Other | Admitting: Certified Registered Nurse Anesthetist

## 2015-10-01 ENCOUNTER — Encounter
Admission: RE | Disposition: A | Discharge status: Home / Self Care | Payer: Self-pay | Source: Ambulatory Visit | Attending: Gastroenterology

## 2015-10-01 DIAGNOSIS — Z8674 Personal history of sudden cardiac arrest: Secondary | ICD-10-CM | POA: Insufficient documentation

## 2015-10-01 DIAGNOSIS — Z79899 Other long term (current) drug therapy: Secondary | ICD-10-CM | POA: Insufficient documentation

## 2015-10-01 DIAGNOSIS — Z6837 Body mass index (BMI) 37.0-37.9, adult: Secondary | ICD-10-CM | POA: Insufficient documentation

## 2015-10-01 DIAGNOSIS — I10 Essential (primary) hypertension: Secondary | ICD-10-CM | POA: Diagnosis not present

## 2015-10-01 DIAGNOSIS — Z7901 Long term (current) use of anticoagulants: Secondary | ICD-10-CM | POA: Diagnosis not present

## 2015-10-01 DIAGNOSIS — R1310 Dysphagia, unspecified: Secondary | ICD-10-CM | POA: Insufficient documentation

## 2015-10-01 DIAGNOSIS — F419 Anxiety disorder, unspecified: Secondary | ICD-10-CM | POA: Diagnosis not present

## 2015-10-01 DIAGNOSIS — Z86718 Personal history of other venous thrombosis and embolism: Secondary | ICD-10-CM | POA: Insufficient documentation

## 2015-10-01 DIAGNOSIS — K625 Hemorrhage of anus and rectum: Secondary | ICD-10-CM | POA: Diagnosis not present

## 2015-10-01 DIAGNOSIS — K529 Noninfective gastroenteritis and colitis, unspecified: Secondary | ICD-10-CM | POA: Insufficient documentation

## 2015-10-01 DIAGNOSIS — M1990 Unspecified osteoarthritis, unspecified site: Secondary | ICD-10-CM | POA: Insufficient documentation

## 2015-10-01 DIAGNOSIS — G43909 Migraine, unspecified, not intractable, without status migrainosus: Secondary | ICD-10-CM | POA: Insufficient documentation

## 2015-10-01 DIAGNOSIS — Z950 Presence of cardiac pacemaker: Secondary | ICD-10-CM | POA: Insufficient documentation

## 2015-10-01 DIAGNOSIS — E669 Obesity, unspecified: Secondary | ICD-10-CM | POA: Diagnosis not present

## 2015-10-01 DIAGNOSIS — I2510 Atherosclerotic heart disease of native coronary artery without angina pectoris: Secondary | ICD-10-CM | POA: Diagnosis not present

## 2015-10-01 DIAGNOSIS — F329 Major depressive disorder, single episode, unspecified: Secondary | ICD-10-CM | POA: Insufficient documentation

## 2015-10-01 DIAGNOSIS — K5730 Diverticulosis of large intestine without perforation or abscess without bleeding: Secondary | ICD-10-CM | POA: Insufficient documentation

## 2015-10-01 DIAGNOSIS — E7800 Pure hypercholesterolemia, unspecified: Secondary | ICD-10-CM | POA: Diagnosis not present

## 2015-10-01 DIAGNOSIS — K219 Gastro-esophageal reflux disease without esophagitis: Secondary | ICD-10-CM | POA: Diagnosis not present

## 2015-10-01 DIAGNOSIS — R1013 Epigastric pain: Secondary | ICD-10-CM | POA: Diagnosis not present

## 2015-10-01 DIAGNOSIS — E785 Hyperlipidemia, unspecified: Secondary | ICD-10-CM | POA: Insufficient documentation

## 2015-10-01 HISTORY — DX: Ventricular tachycardia: I47.2

## 2015-10-01 HISTORY — DX: Hyperlipidemia, unspecified: E78.5

## 2015-10-01 HISTORY — DX: Other cervical disc degeneration, unspecified cervical region: M50.30

## 2015-10-01 HISTORY — DX: Atherosclerotic heart disease of native coronary artery without angina pectoris: I25.10

## 2015-10-01 HISTORY — DX: Unspecified convulsions: R56.9

## 2015-10-01 HISTORY — PX: ESOPHAGOGASTRODUODENOSCOPY (EGD) WITH PROPOFOL: SHX5813

## 2015-10-01 HISTORY — DX: Heart failure, unspecified: I50.9

## 2015-10-01 HISTORY — DX: Ventricular tachycardia, unspecified: I47.20

## 2015-10-01 HISTORY — PX: COLONOSCOPY WITH PROPOFOL: SHX5780

## 2015-10-01 SURGERY — COLONOSCOPY WITH PROPOFOL
Anesthesia: General

## 2015-10-01 MED ORDER — PHENYLEPHRINE HCL 10 MG/ML IJ SOLN
INTRAMUSCULAR | Status: DC | PRN
  Administered 2015-10-01 (×5): 50 ug via INTRAVENOUS

## 2015-10-01 MED ORDER — LIDOCAINE HCL (CARDIAC) 20 MG/ML IV SOLN
INTRAVENOUS | Status: DC | PRN
  Administered 2015-10-01: 100 mg via INTRAVENOUS

## 2015-10-01 MED ORDER — METOPROLOL TARTRATE 25 MG PO TABS
ORAL_TABLET | ORAL | Status: AC
  Administered 2015-10-01: 25 mg
  Filled 2015-10-01: qty 1

## 2015-10-01 MED ORDER — PROPOFOL 500 MG/50ML IV EMUL
INTRAVENOUS | Status: DC | PRN
  Administered 2015-10-01: 100 ug/kg/min via INTRAVENOUS

## 2015-10-01 MED ORDER — SODIUM CHLORIDE 0.9 % IV SOLN
INTRAVENOUS | Status: DC
  Administered 2015-10-01: 1000 mL via INTRAVENOUS

## 2015-10-01 MED ORDER — FENTANYL CITRATE (PF) 100 MCG/2ML IJ SOLN
INTRAMUSCULAR | Status: DC | PRN
  Administered 2015-10-01: 50 ug via INTRAVENOUS

## 2015-10-01 MED ORDER — PROPOFOL 10 MG/ML IV BOLUS
INTRAVENOUS | Status: DC | PRN
  Administered 2015-10-01: 50 mg via INTRAVENOUS
  Administered 2015-10-01: 20 mg via INTRAVENOUS

## 2015-10-01 NOTE — Anesthesia Procedure Notes (Signed)
Performed by: Dinia Joynt Pre-anesthesia Checklist: Patient identified, Emergency Drugs available, Suction available, Patient being monitored and Timeout performed Patient Re-evaluated:Patient Re-evaluated prior to inductionOxygen Delivery Method: Nasal cannula Intubation Type: IV induction       

## 2015-10-01 NOTE — Anesthesia Postprocedure Evaluation (Signed)
Anesthesia Post Note  Patient: Claire Mcdonald  Procedure(s) Performed: Procedure(s) (LRB): COLONOSCOPY WITH PROPOFOL (N/A) ESOPHAGOGASTRODUODENOSCOPY (EGD) WITH PROPOFOL (N/A)  Patient location during evaluation: PACU Anesthesia Type: General Level of consciousness: awake Pain management: pain level controlled Vital Signs Assessment: post-procedure vital signs reviewed and stable Respiratory status: spontaneous breathing Cardiovascular status: blood pressure returned to baseline Postop Assessment: no headache Anesthetic complications: no    Last Vitals:  Filed Vitals:   10/01/15 1130 10/01/15 1140  BP: 105/78 102/83  Pulse: 59   Temp:    Resp: 12     Last Pain: There were no vitals filed for this visit.               Zeniya Lapidus M

## 2015-10-01 NOTE — H&P (Signed)
Primary Care Physician:  [name], NP  Pre-Procedure History & Physical: HPI:  Claire Mcdonald is a 50 y.o. female is here for an endoscopy / colonoscopy   Past Medical History  Diagnosis Date  . Hypertension   . High cholesterol   . Anxiety   . Depression   . Allergy   . GERD (gastroesophageal reflux disease)   . Migraine   . Diverticulosis     seen on CT 2015  . Obesity (BMI 30-39.9)   . DVT (deep venous thrombosis) ([place])   . 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 fib, EF nl, non-obs CAD; St. Jude ICD  . Coronary artery disease   . DDD (degenerative disc disease), cervical   . Heart failure ([place])   . Hyperlipidemia   . Seizures ([place])   . Ventricular tachycardia [facility])     Past Surgical History  Procedure Laterality Date  . Tubal ligation  1998  . Ovarian cyst removal Left   . Central line  03/25/2015       . Hand surgery Left   . Cardiac catheterization N/A 03/27/2015    Procedure: Left Heart Cath and Coronary Angiography;  Surgeon: Troy Sine, MD;  LAD 10%/30%, CFX and RCA OK, EF normal  . Ep implantable device N/A 04/11/2015    Procedure: ICD Implant;  Surgeon: Deboraha Sprang, MD;  [facility] ICD, serial number [id number]  . Ep implantable device N/A 07/16/2015    Procedure: Lead Revision/Repair;  Surgeon: Will Meredith Leeds, MD;  Location: [place] [facility];  Service: Cardiovascular;  Laterality: N/A;    Prior to Admission medications   Medication Sig Start Date End Date Taking? Authorizing Provider  albuterol (PROVENTIL HFA;VENTOLIN HFA) 108 (90 BASE) MCG/ACT inhaler Inhale 2 puffs into the lungs every 6 (six) hours as needed for wheezing or shortness of breath. 01/23/15  Yes Jenise V Bacon Menshew, PA-C  apixaban (ELIQUIS) 5 MG TABS tablet Take 1 tablet (5 mg total) by mouth 2 (two) times daily. 08/03/15  Yes [name], NP  atorvastatin (LIPITOR) 20 MG tablet Take 1 tablet (20 mg total) by mouth daily at 6 PM. 07/06/15  Yes [name], NP   dicyclomine (BENTYL) 20 MG tablet Take 1 tablet (20 mg total) by mouth 3 (three) times daily as needed for spasms. 07/20/15  Yes Daymon Larsen, MD  DULoxetine (CYMBALTA) 20 MG capsule Take 20 mg by mouth daily. 09/14/15  Yes Historical Provider, MD  DULoxetine (CYMBALTA) 60 MG capsule Take 60 mg by mouth daily.   Yes Historical Provider, MD  furosemide (LASIX) 20 MG tablet Take 20 mg by mouth daily and may take an additional daily as directed 09/21/15  Yes Deboraha Sprang, MD  hyoscyamine (LEVSIN SL) 0.125 MG SL tablet Place 0.125 mg under the tongue every 4 (four) hours as needed.   Yes Historical Provider, MD  levETIRAcetam (KEPPRA) 500 MG tablet Take 1 tablet (500 mg total) by mouth 2 (two) times daily. 07/06/15  Yes [name], NP  metoprolol tartrate (LOPRESSOR) 25 MG tablet Take 0.5 tablets (12.5 mg total) by mouth 2 (two) times daily. 08/03/15  Yes [name], NP  omeprazole (PRILOSEC) 20 MG capsule Take 20 mg by mouth daily.   Yes Historical Provider, MD  promethazine (PHENERGAN) 12.5 MG suppository Place 1 suppository (12.5 mg total) rectally every 6 (six) hours as needed for nausea or vomiting. 07/20/15  Yes Daymon Larsen, MD  sertraline (ZOLOFT) 100 MG  tablet Take 100 mg by mouth daily. 09/14/15 09/13/16 Yes Historical Provider, MD  topiramate (TOPAMAX) 100 MG tablet Take 1 tablet (100 mg total) by mouth 2 (two) times daily. 04/12/15  Yes Kinnie Feil, MD    Allergies as of 09/11/2015 - Review Complete 08/03/2015  Allergen Reaction Noted  . Toradol [ketorolac tromethamine] Itching and Rash 12/04/2012  . Tramadol Itching and Rash 12/04/2012  . Zofran [ondansetron hcl] Itching and Rash 12/04/2012    Family History  Problem Relation Age of Onset  . Diabetes Father   . Asthma Father   . Hyperlipidemia Father   . Hypertension Father   . Kidney disease Father   . Stroke Neg Hx   . Asthma Mother   . Depression Mother   . Hyperlipidemia Mother   . Hypertension Mother   .  Cancer Maternal Grandmother   . Early death Maternal Grandmother   . Early death Paternal Grandmother   . Allergies Daughter   . Allergies Son   . Allergies Son   . Heart disease Son     Social History   Social History  . Marital Status: Single    Spouse Name: N/A  . Number of Children: N/A  . Years of Education: N/A   Occupational History  . Not on file.   Social History Main Topics  . Smoking status: Never Smoker   . Smokeless tobacco: Never Used  . Alcohol Use: No  . Drug Use: No  . Sexual Activity: Yes    Birth Control/ Protection: Surgical   Other Topics Concern  . Not on file   Social History Narrative   Lives in [place], [place].     Physical Exam: BP 115/50 mmHg  Pulse 62  Temp(Src) 97.7 F (36.5 C) (Tympanic)  Resp 16  Ht 5\' 2"  (1.575 m)  Wt 92.08 kg (203 lb)  BMI 37.12 kg/m2  SpO2 100%  LMP  General:   Alert,  pleasant and cooperative in NAD Head:  Normocephalic and atraumatic. Neck:  Supple; no masses or thyromegaly. Lungs:  Clear throughout to auscultation.    Heart:  Regular rate and rhythm. Abdomen:  Soft, nontender and nondistended. Normal bowel sounds, without guarding, and without rebound.   Neurologic:  Alert and  oriented x4;  grossly normal neurologically.  Impression/Plan: Claire Mcdonald is here for an endoscopy to be performed for epigstric pain, colon for rectatl bleeding and diarrhea  Risks, benefits, limitations, and alternatives regarding  Endoscopy/colonoscopy have been reviewed with the patient.  Questions have been answered.  All parties agreeable.   Josefine Class, MD  10/01/2015, 10:41 AM

## 2015-10-01 NOTE — Discharge Instructions (Addendum)
Restart your elaquis tonight.   YOU HAD AN ENDOSCOPIC PROCEDURE TODAY: Refer to the procedure report that was given to you for any specific questions about what was found during the examination.  If the procedure report does not answer your questions, please call your gastroenterologist to clarify.  YOU SHOULD EXPECT: Some feelings of bloating in the abdomen. Passage of more gas than usual.  Walking can help get rid of the air that was put into your GI tract during the procedure and reduce the bloating. If you had a lower endoscopy (such as a colonoscopy or flexible sigmoidoscopy) you may notice spotting of blood in your stool or on the toilet paper.   DIET: Your first meal following the procedure should be a light meal and then it is ok to progress to your normal diet.  A half-sandwich or bowl of soup is an example of a good first meal.  Heavy or fried foods are harder to digest and may make you feel nasueas or bloated.  Drink plenty of fluids but you should avoid alcoholic beverages for 24 hours.  ACTIVITY: Your care partner should take you home directly after the procedure.  You should plan to take it easy, moving slowly for the rest of the day.  You can resume normal activity the day after the procedure however you should NOT DRIVE or use heavy machinery for 24 hours (because of the sedation medicines used during the test).    SYMPTOMS TO REPORT IMMEDIATELY  A gastroenterologist can be reached at any hour.  Please call your doctor's office for any of the following symptoms:   Following lower endoscopy (colonoscopy, flexible sigmoidoscopy)  Excessive amounts of blood in the stool  Significant tenderness, worsening of abdominal pains  Swelling of the abdomen that is new, acute  Fever of 100 or higher  Following upper endoscopy (EGD, EUS, ERCP)  Vomiting of blood or coffee ground material  New, significant abdominal pain  New, significant chest pain or pain under the shoulder  blades  Painful or persistently difficult swallowing  New shortness of breath  Black, tarry-looking stools  FOLLOW UP: If any biopsies were taken you will be contacted by phone or by letter within the next 1-3 weeks.  Call your gastroenterologist if you have not heard about the biopsies in 3 weeks.  Please also call your gastroenterologist's office with any specific questions about appointments or follow up tests.

## 2015-10-01 NOTE — Anesthesia Preprocedure Evaluation (Signed)
Anesthesia Evaluation  Patient identified by MRN, date of birth, ID band Patient awake    Reviewed: Allergy & Precautions, NPO status , Patient's Chart, lab work & pertinent test results  Airway Mallampati: I  TM Distance: >3 FB Neck ROM: Full    Dental  (+) Teeth Intact, Implants,    Pulmonary shortness of breath and with exertion,    Pulmonary exam normal        Cardiovascular Exercise Tolerance: Poor hypertension, Pt. on medications + CAD  + pacemaker + Cardiac Defibrillator  Rhythm:Regular Rate:Normal  V-fib arrest 7/16--idiopathic--normal coronary arteries, she was told.   Neuro/Psych Seizures -, Well Controlled,  Anxiety Depression Two seizures since her asystolic arrest in [id number]. None since--on topiramate.    GI/Hepatic GERD  Poorly Controlled and Medicated,Reflux and dysphagia.   Endo/Other    Renal/GU      Musculoskeletal  (+) Arthritis , Osteoarthritis,    Abdominal (+) + obese,  Abdomen: soft.    Peds  Hematology   Anesthesia Other Findings   Reproductive/Obstetrics                             Anesthesia Physical Anesthesia Plan  ASA: IV  Anesthesia Plan: General   Post-op Pain Management:    Induction: Intravenous  Airway Management Planned: Nasal Cannula  Additional Equipment:   Intra-op Plan:   Post-operative Plan:   Informed Consent: I have reviewed the patients History and Physical, chart, labs and discussed the procedure including the risks, benefits and alternatives for the proposed anesthesia with the patient or authorized representative who has indicated his/her understanding and acceptance.   Dental advisory given  Plan Discussed with: CRNA  Anesthesia Plan Comments:         Anesthesia Quick Evaluation

## 2015-10-01 NOTE — Transfer of Care (Signed)
Immediate Anesthesia Transfer of Care Note  Patient: Claire Mcdonald  Procedure(s) Performed: Procedure(s): COLONOSCOPY WITH PROPOFOL (N/A) ESOPHAGOGASTRODUODENOSCOPY (EGD) WITH PROPOFOL (N/A)  Patient Location: PACU  Anesthesia Type:General  Level of Consciousness: sedated  Airway & Oxygen Therapy: Patient Spontanous Breathing and Patient connected to nasal cannula oxygen  Post-op Assessment: Report given to RN and Post -op Vital signs reviewed and stable  Post vital signs: Reviewed and stable  Last Vitals:  Filed Vitals:   10/01/15 1020 10/01/15 1114  BP: 115/50 100/77  Pulse:  69  Temp:  35.9 C  Resp:  15    Complications: No apparent anesthesia complications

## 2015-10-01 NOTE — Op Note (Signed)
[facility] Gastroenterology Patient Name: Claire Mcdonald Procedure Date: 10/01/2015 10:02 AM MRN: [id number] Account #: [id number] Date of Birth: 03/12/1966 Admit Type: Outpatient Age: 50 Room: [facility] ENDO ROOM 2 Gender: Female Note Status: Finalized Procedure:         Upper GI endoscopy Indications:       Epigastric abdominal pain Patient Profile:   This is a 50 year old female. Providers:         Gerrit Heck. Rayann Heman, MD Referring MD:      Guadalupe Maple, MD (Referring MD) Medicines:         Propofol per Anesthesia Complications:     No immediate complications. Procedure:         Pre-Anesthesia Assessment:                    - Prior to the procedure, a History and Physical was                     performed, and patient medications, allergies and                     sensitivities were reviewed. The patient's tolerance of                     previous anesthesia was reviewed.                    After obtaining informed consent, the endoscope was passed                     under direct vision. Throughout the procedure, the                     patient's blood pressure, pulse, and oxygen saturations                     were monitored continuously. The Endoscope was introduced                     through the mouth, and advanced to the second part of                     duodenum. The upper GI endoscopy was accomplished without                     difficulty. The patient tolerated the procedure well. Findings:      The esophagus was normal.      The stomach was normal.      The examined duodenum was normal. Impression:        - Normal esophagus.                    - Normal stomach.                    - Normal examined duodenum.                    - No specimens collected. Recommendation:    - Perform a colonoscopy today.                    - Continue present medications.                    - The findings and recommendations were discussed with the  patient.                    - The findings and recommendations were discussed with the                     patient's family. Procedure Code(s): --- Professional ---                    [phone], Esophagogastroduodenoscopy, flexible, transoral;                     diagnostic, including collection of specimen(s) by                     brushing or washing, when performed (separate procedure) Diagnosis Code(s): --- Professional ---                    R10.13, Epigastric pain CPT copyright 2014 American Medical Association. All rights reserved. The codes documented in this report are preliminary and upon coder review may  be revised to meet current compliance requirements. Mellody Life, MD 10/01/2015 10:50:37 AM This report has been signed electronically. Number of Addenda: 0 Note Initiated On: 10/01/2015 10:02 AM      [facility]

## 2015-10-01 NOTE — Op Note (Signed)
[facility]astroenterology Patient Name: Claire Mcdonald Procedure Date: 10/01/2015 10:03 AM MRN: [id number] Account #: [id number] Date of Birth: 1966/04/08 Admit Type: Outpatient Age: 50 Room: [facility] ENDO ROOM 2 Gender: Female Note Status: Finalized Procedure:         Colonoscopy Indications:       Chronic diarrhea, Rectal bleeding Patient Profile:   This is a 50 year old female. Providers:         Gerrit Heck. Rayann Heman, MD Referring MD:      Guadalupe Maple, MD (Referring MD) Medicines:         Propofol per Anesthesia Complications:     No immediate complications. Procedure:         Pre-Anesthesia Assessment:                    - Prior to the procedure, a History and Physical was                     performed, and patient medications, allergies and                     sensitivities were reviewed. The patient's tolerance of                     previous anesthesia was reviewed.                    After obtaining informed consent, the colonoscope was                     passed under direct vision. Throughout the procedure, the                     patient's blood pressure, pulse, and oxygen saturations                     were monitored continuously. The Colonoscope was                     introduced through the anus and advanced to the the                     terminal ileum. The colonoscopy was performed without                     difficulty. The patient tolerated the procedure well. The                     quality of the bowel preparation was good. Findings:      The perianal and digital rectal examinations were normal.      The terminal ileum appeared normal.      A few large-mouthed diverticula were found at the hepatic flexure.      A few small and large-mouthed diverticula were found in the sigmoid       colon.      The exam was otherwise without abnormality on direct and retroflexion       views.      Biopsies for histology were taken with a cold forceps from the  right       colon, left colon and rectum for evaluation of microscopic colitis. Impression:        - The examined portion of the ileum was normal.                    -  Diverticulosis at the hepatic flexure.                    - Diverticulosis in the sigmoid colon.                    - The examination was otherwise normal on direct and                     retroflexion views.                    - Biopsies were taken with a cold forceps from the right                     colon, left colon and rectum for evaluation of microscopic                     colitis. Recommendation:    - Observe patient in GI recovery unit.                    - Resume regular diet.                    - Continue present medications.                    - Await pathology results.                    - Repeat colonoscopy in 10 years for screening purposes.                    - Return to referring physician.                    - The findings and recommendations were discussed with the                     patient.                    - The findings and recommendations were discussed with the                     patient's family. Procedure Code(s): --- Professional ---                    [phone], Colonoscopy, flexible; with biopsy, single or                     multiple Diagnosis Code(s): --- Professional ---                    K52.9, Noninfective gastroenteritis and colitis,                     unspecified                    K62.5, Hemorrhage of anus and rectum                    K57.30, Diverticulosis of large intestine without                     perforation or abscess without bleeding CPT copyright 2014 [employer]. All rights reserved. The codes documented in this report are preliminary and upon coder review may  be revised to meet current compliance requirements. Gerrit Heck  Rayann Heman, MD 10/01/2015 11:14:41 AM This report has been signed electronically. Number of Addenda: 0 Note Initiated On: 10/01/2015  10:03 AM Scope Withdrawal Time: 0 hours 10 minutes 35 seconds  Total Procedure Duration: 0 hours 16 minutes 1 second       [facility]

## 2015-10-02 ENCOUNTER — Encounter: Payer: Self-pay | Admitting: Gastroenterology

## 2015-10-03 LAB — SURGICAL PATHOLOGY

## 2015-10-04 NOTE — Telephone Encounter (Signed)
This supposedly went to you last week, but no comments from you. Please review.

## 2015-10-06 NOTE — Telephone Encounter (Signed)
We do not think  Her chest pain is cardiax

## 2015-10-12 ENCOUNTER — Telehealth: Payer: Self-pay | Admitting: Internal Medicine

## 2015-10-12 DIAGNOSIS — F419 Anxiety disorder, unspecified: Secondary | ICD-10-CM | POA: Insufficient documentation

## 2015-10-12 DIAGNOSIS — Z86718 Personal history of other venous thrombosis and embolism: Secondary | ICD-10-CM | POA: Insufficient documentation

## 2015-10-12 DIAGNOSIS — K219 Gastro-esophageal reflux disease without esophagitis: Secondary | ICD-10-CM | POA: Insufficient documentation

## 2015-10-12 NOTE — Telephone Encounter (Signed)
Spoke with individual who said she was Gregery Na.  After attempting to discuss symptoms, individual on the phone disclosed that she is patient's mother-in-law.  She states she does not know patient's symptoms, just that her pacemaker is hurting her.  Advised that I would need to speak with the patient regarding symptoms.  Patient's mother-in-law states she will call the patient and have her call us back.

## 2015-10-12 NOTE — Telephone Encounter (Signed)
Pt is calling about the chest pain she is having from her pacemaker . Please assist

## 2015-10-12 NOTE — Telephone Encounter (Signed)
Advised patient that per Dr. Olin Pia notes from 09/28/15 phone call, he does not think her ongoing chest pain is cardiac in nature.  Patient states pain has not changed in character or worsened since her office visit with Dr. Caryl Comes on 09/21/15.  Advised to follow-up with her PCP or an urgent care if it persists.  Reminded patient of upcoming appointment with Chanetta Marshall, NP on 10/24/15.  She verbalizes understanding of all instructions and denies additional questions at this time.

## 2015-10-12 NOTE — Telephone Encounter (Signed)
Claire Mcdonald is calling because she is hurting real bad where her pacemaker is . It has been turning and when lying down its like she is getting shocked .

## 2015-10-14 DIAGNOSIS — D251 Intramural leiomyoma of uterus: Secondary | ICD-10-CM | POA: Insufficient documentation

## 2015-10-15 DIAGNOSIS — R87810 Cervical high risk human papillomavirus (HPV) DNA test positive: Secondary | ICD-10-CM

## 2015-10-15 DIAGNOSIS — R87610 Atypical squamous cells of undetermined significance on cytologic smear of cervix (ASC-US): Secondary | ICD-10-CM | POA: Insufficient documentation

## 2015-10-16 ENCOUNTER — Telehealth: Payer: Self-pay | Admitting: *Deleted

## 2015-10-16 NOTE — Telephone Encounter (Signed)
Called to check on status to return to program. LMOM 

## 2015-10-16 NOTE — Progress Notes (Signed)
Cardiac Individual Treatment Plan  Patient Details  Name: Claire Mcdonald MRN: [id number] Date of Birth: [birth year] Referring Provider:  Deboraha Sprang, MD  Initial Encounter Date:    Visit Diagnosis: Acute non-ST-elevation myocardial infarction [facility]) - Plan: CARDIAC REHAB 30 DAY REVIEW  Patient's Home Medications on Admission:  Current outpatient prescriptions:  .  albuterol (PROVENTIL HFA;VENTOLIN HFA) 108 (90 BASE) MCG/ACT inhaler, Inhale 2 puffs into the lungs every 6 (six) hours as needed for wheezing or shortness of breath., Disp: 1 Inhaler, Rfl: 0 .  apixaban (ELIQUIS) 5 MG TABS tablet, Take 1 tablet (5 mg total) by mouth 2 (two) times daily., Disp: 60 tablet, Rfl: 12 .  atorvastatin (LIPITOR) 20 MG tablet, Take 1 tablet (20 mg total) by mouth daily at 6 PM., Disp: 90 tablet, Rfl: 1 .  dicyclomine (BENTYL) 20 MG tablet, Take 1 tablet (20 mg total) by mouth 3 (three) times daily as needed for spasms., Disp: 30 tablet, Rfl: 0 .  DULoxetine (CYMBALTA) 20 MG capsule, Take 20 mg by mouth daily., Disp: , Rfl:  .  DULoxetine (CYMBALTA) 60 MG capsule, Take 60 mg by mouth daily., Disp: , Rfl:  .  furosemide (LASIX) 20 MG tablet, Take 20 mg by mouth daily and may take an additional daily as directed, Disp: 90 tablet, Rfl: 3 .  hyoscyamine (LEVSIN SL) 0.125 MG SL tablet, Place 0.125 mg under the tongue every 4 (four) hours as needed., Disp: , Rfl:  .  levETIRAcetam (KEPPRA) 500 MG tablet, Take 1 tablet (500 mg total) by mouth 2 (two) times daily., Disp: 60 tablet, Rfl: 2 .  metoprolol tartrate (LOPRESSOR) 25 MG tablet, Take 0.5 tablets (12.5 mg total) by mouth 2 (two) times daily., Disp: 60 tablet, Rfl: 1 .  omeprazole (PRILOSEC) 20 MG capsule, Take 20 mg by mouth daily., Disp: , Rfl:  .  promethazine (PHENERGAN) 12.5 MG suppository, Place 1 suppository (12.5 mg total) rectally every 6 (six) hours as needed for nausea or vomiting., Disp: 12 each, Rfl: 0 .  sertraline (ZOLOFT) 100 MG tablet, Take 100  mg by mouth daily., Disp: , Rfl:  .  topiramate (TOPAMAX) 100 MG tablet, Take 1 tablet (100 mg total) by mouth 2 (two) times daily., Disp: 60 tablet, Rfl: 1  Past Medical History: Past Medical History  Diagnosis Date  . Hypertension   . High cholesterol   . Anxiety   . Depression   . Allergy   . GERD (gastroesophageal reflux disease)   . Migraine   . Diverticulosis     seen on CT 2015  . Obesity (BMI 30-39.9)   . DVT (deep venous thrombosis) ([place])   . 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 fib, EF nl, non-obs CAD; [employer] ICD  . Coronary artery disease   . DDD (degenerative disc disease), cervical   . Heart failure ([place])   . Hyperlipidemia   . Seizures ([place])   . Ventricular tachycardia ([facility])     Tobacco Use: History  Smoking status  . Never Smoker   Smokeless tobacco  . Never Used    Labs: Recent Review Flowsheet Data    Labs for ITP Cardiac and Pulmonary Rehab Latest Ref Rng 03/25/2015 03/25/2015 03/25/2015 03/26/2015 03/28/2015   Trlycerides <150 mg/dL - 133 - - 93   PHART 7.350 - 7.450 7.31(L) - 7.356 7.346(L) -   PCO2ART 35.0 - 45.0 mmHg 40 - 35.7 34.8(L) -   HCO3 20.0 - 24.0 mEq/L 20.1(L) -  19.3(L) 19.1(L) -   TCO2 0 - 100 mmol/L - - 20.4 20 -   ACIDBASEDEF 0.0 - 2.0 mmol/L 5.8(H) - 5.1(H) 6.0(H) -   O2SAT - 94.9 - 99.3 99.0 -       Exercise Target Goals:    Exercise Program Goal: Individual exercise prescription set with THRR, safety & activity barriers. Participant demonstrates ability to understand and report RPE using BORG scale, to self-measure pulse accurately, and to acknowledge the importance of the exercise prescription.  Exercise Prescription Goal: Starting with aerobic activity 30 plus minutes a day, 3 days per week for initial exercise prescription. Provide home exercise prescription and guidelines that participant acknowledges understanding prior to discharge.  Activity Barriers & Risk Stratification:     Activity Barriers & Cardiac Risk  Stratification - 05/14/15 1438    Activity Barriers & Cardiac Risk Stratification   Activity Barriers Back Problems;Joint Problems;Neck/Spine Problems;Balance Concerns;Other (comment)   Comments Sciatic nerve, bursitis in both hips. Has received injections in lower back for symptoms. Last time was June 2016.   Cardiac Risk Stratification High      6 Minute Walk:     6 Minute Walk      05/14/15 1613       6 Minute Walk   Phase Initial     Distance 500 feet     Walk Time 3 minutes     RPE 9     Symptoms Yes (comment)     Comments No symptoms of pain. Had to stop due to weakness in the legs.      Resting HR 68 bpm     Resting BP 104/76 mmHg     Max Ex. HR 98 bpm     Max Ex. BP 102/60 mmHg        Initial Exercise Prescription:     Initial Exercise Prescription - 05/14/15 1600    Date of Initial Exercise Prescription   Date 05/14/15   Bike   Level 0.5   Minutes 15  5 min x 3 with rest in between if needed   Recumbant Bike   Level 1   Watts 20   Minutes 15  5 min x 3 with rest in between if needed   NuStep   Level 2   Watts 20   Minutes 15  5 min x 3 with rest in between if needed   Arm Ergometer   Level 1   Watts 8   Minutes 15  5 min x 3 with rest in between if needed   Arm/Foot Ergometer   Level 1   Watts 10   Minutes 15  5 min x 3 with rest in between if needed   Cybex   Level 1   RPM 30   Minutes 15  5 min x 3 with rest in between if needed   Recumbant Elliptical   Level 1   Watts 20   Minutes 15  5 min x 3 with rest in between if needed   REL-XR   Level 1   Watts 20   Minutes 15  5 min x 3 with rest in between if needed   Prescription Details   Frequency (times per week) 3   Duration Progress to 30 minutes of continuous aerobic without signs/symptoms of physical distress   Intensity   THRR REST +  20   Ratings of Perceived Exertion 11-13   Progression Continue progressive overload as per policy without signs/symptoms or physical  distress.  Resistance Training   Training Prescription Yes   Weight 1   Reps 10-12      Exercise Prescription Changes:     Exercise Prescription Changes      05/29/15 0600 06/26/15 0700 07/23/15 1400 08/21/15 0700 09/05/15 1100   Exercise Review   Progression No  Has not started exercise; medical review on 05/14/15 No  Has not started exercise; medical review on 05/14/15 No  Has not started exercise; medical review on 05/14/15 No  Has not started exercise; medical review on 05/14/15    Response to Exercise   Symptoms     None   Comments     First day of exercise! Patient was oriented to the gym and the equipment functions and settings. Procedures and policies of the gym were outlined and explained. The patient's individual exercise prescription and treatment plan were reviewed with them. All starting workloads were established based on the results of the functional testing  done at the initial intake visit. The plan for exercise progression was also introduced and progression will be customized based on the patient's performance and goals. There has been a several month gap between this visit and Shonya's medical review. Due to this, her ex. Rx. Was reviewed and special care was taken during her exercise to insure the workloads were manageable.   Duration     Progress to 30 minutes of continuous aerobic without signs/symptoms of physical distress   Intensity     Other (comment)  Rest + 20   Progression     Continue progressive overload as per policy without signs/symptoms or physical distress.   Resistance Training   Training Prescription     Yes   Weight     2   Reps     10-15   NuStep   Level     2   Watts     35   Minutes     20     09/07/15 0746 10/08/15 1100         Exercise Review   Progression Yes No  Absent since 09/07/15      Response to Exercise   Blood Pressure (Admit) 140/80 mmHg       Blood Pressure (Exercise) 144/88 mmHg       Blood Pressure (Exit) 148/88 mmHg        Heart Rate (Admit) 113 bpm       Heart Rate (Exercise) 123 bpm       Heart Rate (Exit) 96 bpm       Rating of Perceived Exertion (Exercise) 9       Symptoms None       Comments Made slight progression with exercise goals. Nidhi only game twice during this 30 day review. This inconsistent attendance makes progression difficult. We will follow up with her at her next visit on barriers to coming to class and what we can do to help.  Absent since last review, workloads will be re-evaluated upon her return      Duration Progress to 30 minutes of continuous aerobic without signs/symptoms of physical distress Progress to 30 minutes of continuous aerobic without signs/symptoms of physical distress      Intensity Other (comment)  Rest + 20 Other (comment)  Rest + 20      Progression Continue progressive overload as per policy without signs/symptoms or physical distress. Continue progressive overload as per policy without signs/symptoms or physical distress.      Horticulturist, commercial  Prescription Yes Yes      Weight 2 2      Reps 10-15 10-15      Interval Training   Interval Training No No      NuStep   Level 2 2      Watts 35 35      Minutes 20 20      Biostep-RELP   Level 2 2      Watts 25 25      Minutes 15 15         Discharge Exercise Prescription (Final Exercise Prescription Changes):     Exercise Prescription Changes - 10/08/15 1100    Exercise Review   Progression No  Absent since 09/07/15   Response to Exercise   Comments Absent since last review, workloads will be re-evaluated upon her return   Duration Progress to 30 minutes of continuous aerobic without signs/symptoms of physical distress   Intensity Other (comment)  Rest + 20   Progression Continue progressive overload as per policy without signs/symptoms or physical distress.   Resistance Training   Training Prescription Yes   Weight 2   Reps 10-15   Interval Training   Interval Training No   NuStep    Level 2   Watts 35   Minutes 20   Biostep-RELP   Level 2   Watts 25   Minutes 15      Nutrition:  Target Goals: Understanding of nutrition guidelines, daily intake of sodium <1537m, cholesterol <2044m calories 30% from fat and 7% or less from saturated fats, daily to have 5 or more servings of fruits and vegetables.  Biometrics:     Pre Biometrics - 05/14/15 1621    Pre Biometrics   Height 5' 1.75" (1.568 m)   Weight 204 lb 12.8 oz (92.897 kg)   Waist Circumference 38.5 inches   Hip Circumference 48 inches   Waist to Hip Ratio 0.8 %   BMI (Calculated) 37.8       Nutrition Therapy Plan and Nutrition Goals:   Nutrition Discharge: Rate Your Plate Scores:   Nutrition Goals Re-Evaluation:   Psychosocial: Target Goals: Acknowledge presence or absence of depression, maximize coping skills, provide positive support system. Participant is able to verbalize types and ability to use techniques and skills needed for reducing stress and depression.  Initial Review & Psychosocial Screening:     Initial Psych Review & Screening - 05/14/15 1905    Initial Review   Current issues with Current Sleep Concerns;Current Stress Concerns   Source of Stress Concerns Financial;Transportation   Family Dynamics   Good Support System? Yes   Comments Children are helping Li[place]et this time   Barriers   Psychosocial barriers to participate in program There are no identifiable barriers or psychosocial needs.;The patient should benefit from training in stress management and relaxation.   Screening Interventions   Interventions Encouraged to exercise;Program counselor consult      Quality of Life Scores:     Quality of Life - 05/14/15 1852    Quality of Life Scores   Health/Function Pre 14.57 %   Socioeconomic Pre 23.14 %   Psych/Spiritual Pre 30 %   Family Pre 28.5 %   GLOBAL Pre 21.56 %      PHQ-9:     Recent Review Flowsheet Data    [facility]  02/01/2015   Decreased Interest 0 0   Down, Depressed, Hopeless 3 0   PHQ - 2 Score 3  0   Altered sleeping 3 -   Tired, decreased energy 3 -   Change in appetite 3 -   Feeling bad or failure about yourself  0 -   Trouble concentrating 3 -   Moving slowly or fidgety/restless 3 -   Suicidal thoughts 0 -   PHQ-9 Score 18 -   Difficult doing work/chores Not difficult at all -      Psychosocial Evaluation and Intervention:     Psychosocial Evaluation - 09/05/15 0949    Psychosocial Evaluation & Interventions   Comments Counselor met with Ms. Mealing today for initial psychosocial evaluation.  She is a 50 year old female with multiple health issues, including chronic pain, a seizure disorder, an upcoming colonoscopy and possible hysterectomy on top of her recent cardiac arrest (7/31).  Ms. Depoy has two adult children living in her home who help care for her, as well as a father and significant other close by, so she has a strong support system.  She reports a long history of not sleeping well  and currently states she does not have much appetite.   She reports a history of depression and anxiety and is currently on medications for this that are currently not working well as her PHQ-9 scores were 18, which is concerning.  Ms. Crowl has multiple stressors currently with health issues and financial strain due to not being able to work.  Also, parenting is stressful for her at this time.  Ms. Mairena is unable to drive for 6 months due to her seizure disorder.  She has goals for this program to lose weight and get healthier in general - hopefully get back to work soon.  She has home gym equipment that she will begin using once she is done with this program or even on the off days.  Counselor recommended that Ms. Roppolo speak with her Dr. about her current anti-depressant medications not working, as well as possibly considering ordering a sleep study due to her long history of not sleeping well.  She also mentioned  addressing her chronic pain would be helpful, since this impacts her mood and ability to sleep.  Counselor also recommended Ms. Eppolito see the dietician for help addressing her weight loss goals.       Psychosocial Re-Evaluation:     Psychosocial Re-Evaluation      05/30/15 1845 06/06/15 1154 06/21/15 1415       Psychosocial Re-Evaluation   Interventions Encouraged to attend Cardiac Rehabilitation for the exercise;Stress management education Stress management education Encouraged to attend Cardiac Rehabilitation for the exercise     Comments Lashonta has the stressor of just being sick so unable to attend Cardiac Rehab the past 2 days.  Stress of being sick and missing still missing CR.  Has been unable to attend Cardiac Rehab lately and I left her a vm.         Vocational Rehabilitation: Provide vocational rehab assistance to qualifying candidates.   Vocational Rehab Evaluation & Intervention:     Vocational Rehab - 05/14/15 1440    Initial Vocational Rehab Evaluation & Intervention   Assessment shows need for Vocational Rehabilitation No      Education: Education Goals: Education classes will be provided on a weekly basis, covering required topics. Participant will state understanding/return demonstration of topics presented.  Learning Barriers/Preferences:     Learning Barriers/Preferences - 05/14/15 1439    Learning Barriers/Preferences   Learning Barriers Sight   Learning Preferences Group Instruction  Education Topics: General Nutrition Guidelines/Fats and Fiber: -Group instruction provided by verbal, written material, models and posters to present the general guidelines for heart healthy nutrition. Gives an explanation and review of dietary fats and fiber.   Controlling Sodium/Reading Food Labels: -Group verbal and written material supporting the discussion of sodium use in heart healthy nutrition. Review and explanation with models, verbal and written materials  for utilization of the food label.   Exercise Physiology & Risk Factors: - Group verbal and written instruction with models to review the exercise physiology of the cardiovascular system and associated critical values. Details cardiovascular disease risk factors and the goals associated with each risk factor.   Aerobic Exercise & Resistance Training: - Gives group verbal and written discussion on the health impact of inactivity. On the components of aerobic and resistive training programs and the benefits of this training and how to safely progress through these programs.   Flexibility, Balance, General Exercise Guidelines: - Provides group verbal and written instruction on the benefits of flexibility and balance training programs. Provides general exercise guidelines with specific guidelines to those with heart or lung disease. Demonstration and skill practice provided.   Stress Management: - Provides group verbal and written instruction about the health risks of elevated stress, cause of high stress, and healthy ways to reduce stress.          Cardiac Rehab from 09/05/2015 in [facility] Cardiac and Pulmonary Rehab   Date  09/05/15   Educator  [facility]   Instruction Review Code  2- meets goals/outcomes      Depression: - Provides group verbal and written instruction on the correlation between heart/lung disease and depressed mood, treatment options, and the stigmas associated with seeking treatment.   Anatomy & Physiology of the Heart: - Group verbal and written instruction and models provide basic cardiac anatomy and physiology, with the coronary electrical and arterial systems. Review of: AMI, Angina, Valve disease, Heart Failure, Cardiac Arrhythmia, Pacemakers, and the ICD.   Cardiac Procedures: - Group verbal and written instruction and models to describe the testing methods done to diagnose heart disease. Reviews the outcomes of the test results. Describes the treatment choices: Medical  Management, Angioplasty, or Coronary Bypass Surgery.   Cardiac Medications: - Group verbal and written instruction to review commonly prescribed medications for heart disease. Reviews the medication, class of the drug, and side effects. Includes the steps to properly store meds and maintain the prescription regimen.   Go Sex-Intimacy & Heart Disease, Get SMART - Goal Setting: - Group verbal and written instruction through game format to discuss heart disease and the return to sexual intimacy. Provides group verbal and written material to discuss and apply goal setting through the application of the S.M.A.R.T. Method.   Other Matters of the Heart: - Provides group verbal, written materials and models to describe Heart Failure, Angina, Valve Disease, and Diabetes in the realm of heart disease. Includes description of the disease process and treatment options available to the cardiac patient.   Exercise & Equipment Safety: - Individual verbal instruction and demonstration of equipment use and safety with use of the equipment.      Cardiac Rehab from 09/05/2015 in [facility] Cardiac and Pulmonary Rehab   Date  05/14/15   Educator  Sb   Instruction Review Code  2- meets goals/outcomes      Infection Prevention: - Provides verbal and written material to individual with discussion of infection control including proper hand washing and proper equipment cleaning during exercise  session.      Cardiac Rehab from 09/05/2015 in [facility] Cardiac and Pulmonary Rehab   Date  05/14/15   Educator  SB   Instruction Review Code  2- meets goals/outcomes      Falls Prevention: - Provides verbal and written material to individual with discussion of falls prevention and safety.      Cardiac Rehab from 09/05/2015 in [facility] Cardiac and Pulmonary Rehab   Date  05/14/15   Educator  SB   Instruction Review Code  2- meets goals/outcomes      Diabetes: - Individual verbal and written instruction to review  signs/symptoms of diabetes, desired ranges of glucose level fasting, after meals and with exercise. Advice that pre and post exercise glucose checks will be done for 3 sessions at entry of program.    Knowledge Questionnaire Score:     Knowledge Questionnaire Score - 05/14/15 1851    Knowledge Questionnaire Score   Pre Score 19/28      Personal Goals and Risk Factors at Admission:     Personal Goals and Risk Factors at Admission - 05/14/15 1442    Personal Goals and Risk Factors on Admission    Weight Management Yes;Obesity   Intervention Learn and follow the exercise and diet guidelines while in the program. Utilize the nutrition and education classes to help gain knowledge of the diet and exercise expectations in the program   Intervention Provide weight management tools through evaluation completed by registered dietician and exercise physiologist.  Establish a goal weight with participant.   Admit Weight 204 lb (92.534 kg)   Goal Weight 150 lb (68.04 kg)   Increase Aerobic Exercise and Physical Activity Yes   Intervention While in program, learn and follow the exercise prescription taught. Start at a low level workload and increase workload after able to maintain previous level for 30 minutes. Increase time before increasing intensity.   Understand more about Heart/Pulmonary Disease. Yes   Intervention While in program utilize professionals for any questions, and attend the education sessions. Great websites to use are www.americanheart.org or www.lung.org for reliable information.   Diabetes No   Hypertension Yes   Goal Participant will see blood pressure controlled within the values of 140/72m/Hg or within value directed by their physician.   Intervention Provide nutrition & aerobic exercise along with prescribed medications to achieve BP 140/90 or less.   Lipids Yes   Goal Cholesterol controlled with medications as prescribed, with individualized exercise RX and with  personalized nutrition plan. Value goals: LDL < 757m HDL > 4026mParticipant states understanding of desired cholesterol values and following prescriptions.   Intervention Provide nutrition & aerobic exercise along with prescribed medications to achieve LDL <72m93mDL >40mg3mStress Yes   Goal To meet with psychosocial counselor for stress and relaxation information and guidance. To state understanding of performing relaxation techniques and or identifying personal stressors.   Intervention Provide education on types of stress, identifiying stressors, and ways to cope with stress. Provide demonstration and active practice of relaxation techniques.      Personal Goals and Risk Factors Review:      Goals and Risk Factor Review      05/30/15 1845 06/21/15 1414 09/11/15 0751 10/08/15 1106     Increase Aerobic Exercise and Physical Activity   Goals Progress/Improvement seen  No No Yes No    Comments Unable to attend the past 2 days since she has been out sick.  Has been unable to attend Cardiac  Rehab lately and I left her a vm.  Made slight progression with exercise goals. Deretha only game twice during this 30 day review. This inconsistent attendance makes progression difficult. We will follow up with her at her next visit on barriers to coming to class and what we can do to help.  Maydelin has been absent since 09/07/15. No progression will be made until her return. Ex. rx. will be re-evaluated when she returns based upon her physical capacity.        Personal Goals Discharge (Final Personal Goals and Risk Factors Review):      Goals and Risk Factor Review - 10/08/15 1106    Increase Aerobic Exercise and Physical Activity   Goals Progress/Improvement seen  No   Comments Rogene has been absent since 09/07/15. No progression will be made until her return. Ex. rx. will be re-evaluated when she returns based upon her physical capacity.       ITP Comments:     ITP Comments      07/30/15 1221  08/26/15 1237 09/20/15 1138 10/16/15 0813     ITP Comments 30 day review preparation: Continue with ITP. Elexa has been absent this month. Had procedure done to replace her ICD wire. Plans to return this week. Ready for 30 day review.  Continue with ITP  HAs not returned since orientation 05/14/2015. Planned to return 12/28, but called in sick. Ready for 30 day review. Continue with ITP. Kaylei has attended 2 sessions since last review. 30 day review.  Continue with ITP. 4 sessions only, last visit 09/07/2015       Comments:

## 2015-10-24 ENCOUNTER — Encounter: Payer: Medicaid Other | Admitting: Nurse Practitioner

## 2015-10-24 ENCOUNTER — Encounter: Payer: Medicaid Other | Attending: Internal Medicine

## 2015-10-24 DIAGNOSIS — I214 Non-ST elevation (NSTEMI) myocardial infarction: Secondary | ICD-10-CM | POA: Insufficient documentation

## 2015-10-24 NOTE — Addendum Note (Signed)
Addended by: Lynford Humphrey on: 10/24/2015 07:20 AM   Modules accepted: Orders

## 2015-10-31 ENCOUNTER — Encounter: Payer: Medicaid Other | Admitting: Internal Medicine

## 2015-11-05 ENCOUNTER — Encounter: Payer: Self-pay | Admitting: *Deleted

## 2015-11-05 DIAGNOSIS — I214 Non-ST elevation (NSTEMI) myocardial infarction: Secondary | ICD-10-CM

## 2015-11-09 ENCOUNTER — Telehealth: Payer: Self-pay | Admitting: *Deleted

## 2015-11-09 NOTE — Telephone Encounter (Signed)
I left Durelle Screws a vm and asked her to let us know either way if she is plan on returning to Cardiac Rehab. Has not been here since Sep 07, 2015.

## 2015-11-09 NOTE — Progress Notes (Signed)
Cardiac Individual Treatment Plan  Patient Details  Name: Claire Mcdonald MRN: [id number] Date of Birth: May 19, 1966 Referring Provider:  No ref. provider found  Initial Encounter Date:       Cardiac Rehab from 05/14/2015 in [facility] Cardiac and Pulmonary Rehab   Date  05/14/15      Visit Diagnosis: Acute non-ST-elevation myocardial infarction [facility])  Patient's Home Medications on Admission:  Current outpatient prescriptions:  .  albuterol (PROVENTIL HFA;VENTOLIN HFA) 108 (90 BASE) MCG/ACT inhaler, Inhale 2 puffs into the lungs every 6 (six) hours as needed for wheezing or shortness of breath., Disp: 1 Inhaler, Rfl: 0 .  apixaban (ELIQUIS) 5 MG TABS tablet, Take 1 tablet (5 mg total) by mouth 2 (two) times daily., Disp: 60 tablet, Rfl: 12 .  atorvastatin (LIPITOR) 20 MG tablet, Take 1 tablet (20 mg total) by mouth daily at 6 PM., Disp: 90 tablet, Rfl: 1 .  dicyclomine (BENTYL) 20 MG tablet, Take 1 tablet (20 mg total) by mouth 3 (three) times daily as needed for spasms., Disp: 30 tablet, Rfl: 0 .  DULoxetine (CYMBALTA) 20 MG capsule, Take 20 mg by mouth daily., Disp: , Rfl:  .  DULoxetine (CYMBALTA) 60 MG capsule, Take 60 mg by mouth daily., Disp: , Rfl:  .  furosemide (LASIX) 20 MG tablet, Take 20 mg by mouth daily and may take an additional daily as directed, Disp: 90 tablet, Rfl: 3 .  hyoscyamine (LEVSIN SL) 0.125 MG SL tablet, Place 0.125 mg under the tongue every 4 (four) hours as needed., Disp: , Rfl:  .  levETIRAcetam (KEPPRA) 500 MG tablet, Take 1 tablet (500 mg total) by mouth 2 (two) times daily., Disp: 60 tablet, Rfl: 2 .  metoprolol tartrate (LOPRESSOR) 25 MG tablet, Take 0.5 tablets (12.5 mg total) by mouth 2 (two) times daily., Disp: 60 tablet, Rfl: 1 .  omeprazole (PRILOSEC) 20 MG capsule, Take 20 mg by mouth daily., Disp: , Rfl:  .  promethazine (PHENERGAN) 12.5 MG suppository, Place 1 suppository (12.5 mg total) rectally every 6 (six) hours as needed for nausea or vomiting., Disp: 12  each, Rfl: 0 .  sertraline (ZOLOFT) 100 MG tablet, Take 100 mg by mouth daily., Disp: , Rfl:  .  topiramate (TOPAMAX) 100 MG tablet, Take 1 tablet (100 mg total) by mouth 2 (two) times daily., Disp: 60 tablet, Rfl: 1  Past Medical History: Past Medical History  Diagnosis Date  . Hypertension   . High cholesterol   . Anxiety   . Depression   . Allergy   . GERD (gastroesophageal reflux disease)   . Migraine   . Diverticulosis     seen on CT 2015  . Obesity (BMI 30-39.9)   . DVT (deep venous thrombosis) ([place])   . Idiopathic cardiac arrest ([place]) 02/2015    V. fib, EF nl, non-obs CAD; [employer] ICD  . Coronary artery disease   . DDD (degenerative disc disease), cervical   . Heart failure ([place])   . Hyperlipidemia   . Seizures ([place])   . Ventricular tachycardia ([facility])     Tobacco Use: History  Smoking status  . Never Smoker   Smokeless tobacco  . Never Used    Labs: Recent Review Flowsheet Data    Labs for ITP Cardiac and Pulmonary Rehab Latest Ref Rng 03/25/2015 03/25/2015 03/25/2015 03/26/2015 03/28/2015   Trlycerides <150 mg/dL - 133 - - 93   PHART 7.350 - 7.450 7.31(L) - 7.356 7.346(L) -   PCO2ART 35.0 -  45.0 mmHg 40 - 35.7 34.8(L) -   HCO3 20.0 - 24.0 mEq/L 20.1(L) - 19.3(L) 19.1(L) -   TCO2 0 - 100 mmol/L - - 20.4 20 -   ACIDBASEDEF 0.0 - 2.0 mmol/L 5.8(H) - 5.1(H) 6.0(H) -   O2SAT - 94.9 - 99.3 99.0 -       Exercise Target Goals:    Exercise Program Goal: Individual exercise prescription set with THRR, safety & activity barriers. Participant demonstrates ability to understand and report RPE using BORG scale, to self-measure pulse accurately, and to acknowledge the importance of the exercise prescription.  Exercise Prescription Goal: Starting with aerobic activity 30 plus minutes a day, 3 days per week for initial exercise prescription. Provide home exercise prescription and guidelines that participant acknowledges understanding prior to discharge.  Activity Barriers &  Risk Stratification:     Activity Barriers & Cardiac Risk Stratification - 05/14/15 1438    Activity Barriers & Cardiac Risk Stratification   Activity Barriers Back Problems;Joint Problems;Neck/Spine Problems;Balance Concerns;Other (comment)   Comments Sciatic nerve, bursitis in both hips. Has received injections in lower back for symptoms. Last time was June 2016.   Cardiac Risk Stratification High      6 Minute Walk:     6 Minute Walk      05/14/15 1613       6 Minute Walk   Phase Initial     Distance 500 feet     Walk Time 3 minutes     RPE 9     Symptoms Yes (comment)     Comments No symptoms of pain. Had to stop due to weakness in the legs.      Resting HR 68 bpm     Resting BP 104/76 mmHg     Max Ex. HR 98 bpm     Max Ex. BP 102/60 mmHg        Initial Exercise Prescription:     Initial Exercise Prescription - 05/14/15 1600    Date of Initial Exercise Prescription   Date 05/14/15   Bike   Level 0.5   Minutes 15  5 min x 3 with rest in between if needed   Recumbant Bike   Level 1   Watts 20   Minutes 15  5 min x 3 with rest in between if needed   NuStep   Level 2   Watts 20   Minutes 15  5 min x 3 with rest in between if needed   Arm Ergometer   Level 1   Watts 8   Minutes 15  5 min x 3 with rest in between if needed   Arm/Foot Ergometer   Level 1   Watts 10   Minutes 15  5 min x 3 with rest in between if needed   Cybex   Level 1   RPM 30   Minutes 15  5 min x 3 with rest in between if needed   Recumbant Elliptical   Level 1   Watts 20   Minutes 15  5 min x 3 with rest in between if needed   REL-XR   Level 1   Watts 20   Minutes 15  5 min x 3 with rest in between if needed   Prescription Details   Frequency (times per week) 3   Duration Progress to 30 minutes of continuous aerobic without signs/symptoms of physical distress   Intensity   THRR REST +  20   Ratings of Perceived Exertion  11-13   Progression   Progression Continue  progressive overload as per policy without signs/symptoms or physical distress.   Resistance Training   Training Prescription Yes   Weight 1   Reps 10-12      Perform Capillary Blood Glucose checks as needed.  Exercise Prescription Changes:     Exercise Prescription Changes      05/29/15 0600 06/26/15 0700 07/23/15 1400 08/21/15 0700 09/05/15 1100   Exercise Review   Progression No  Has not started exercise; medical review on 05/14/15 No  Has not started exercise; medical review on 05/14/15 No  Has not started exercise; medical review on 05/14/15 No  Has not started exercise; medical review on 05/14/15    Response to Exercise   Symptoms     None   Comments     First day of exercise! Patient was oriented to the gym and the equipment functions and settings. Procedures and policies of the gym were outlined and explained. The patient's individual exercise prescription and treatment plan were reviewed with them. All starting workloads were established based on the results of the functional testing  done at the initial intake visit. The plan for exercise progression was also introduced and progression will be customized based on the patient's performance and goals. There has been a several month gap between this visit and Claire Mcdonald's medical review. Due to this, her ex. Rx. Was reviewed and special care was taken during her exercise to insure the workloads were manageable.   Duration     Progress to 30 minutes of continuous aerobic without signs/symptoms of physical distress   Intensity     Other (comment)  Rest + 20   Progression   Progression     Continue progressive overload as per policy without signs/symptoms or physical distress.   Resistance Training   Training Prescription (read-only)     Yes   Weight (read-only)     2   Reps (read-only)     10-15   NuStep   Level (read-only)     2   Watts (read-only)     35   Minutes (read-only)     20     09/07/15 0746 10/08/15 1100 11/05/15 1200        Exercise Review   Progression Yes No  Absent since 09/07/15 No  Absent since 09/07/15     Response to Exercise   Blood Pressure (Admit) 140/80 mmHg       Blood Pressure (Exercise) 144/88 mmHg       Blood Pressure (Exit) 148/88 mmHg       Heart Rate (Admit) 113 bpm       Heart Rate (Exercise) 123 bpm       Heart Rate (Exit) 96 bpm       Rating of Perceived Exertion (Exercise) 9       Symptoms None       Comments Made slight progression with exercise goals. Claire Mcdonald only game twice during this 30 day review. This inconsistent attendance makes progression difficult. We will follow up with her at her next visit on barriers to coming to class and what we can do to help.  Absent since last review, workloads will be re-evaluated upon her return Absent since last review, workloads will be re-evaluated upon her return     Duration Progress to 30 minutes of continuous aerobic without signs/symptoms of physical distress Progress to 30 minutes of continuous aerobic without signs/symptoms of physical distress Progress to 30 minutes  of continuous aerobic without signs/symptoms of physical distress     Intensity Other (comment)  Rest + 20 Other (comment)  Rest + 20 Other (comment)  Rest + 20     Progression   Progression Continue progressive overload as per policy without signs/symptoms or physical distress. Continue progressive overload as per policy without signs/symptoms or physical distress. Continue progressive overload as per policy without signs/symptoms or physical distress.     Resistance Training   Training Prescription (read-only) Yes Yes      Weight (read-only) 2 2      Reps (read-only) 10-15 10-15      Interval Training   Interval Training No No No     NuStep   Level (read-only) 2 2      Watts (read-only) 35 35      Minutes (read-only) 20 20      Biostep-RELP   Level (read-only) 2 2      Watts (read-only) 25 25      Minutes (read-only) 15 15         Exercise  Comments:   Discharge Exercise Prescription (Final Exercise Prescription Changes):     Exercise Prescription Changes - 11/05/15 1200    Exercise Review   Progression No  Absent since 09/07/15   Response to Exercise   Comments Absent since last review, workloads will be re-evaluated upon her return   Duration Progress to 30 minutes of continuous aerobic without signs/symptoms of physical distress   Intensity Other (comment)  Rest + 20   Progression   Progression Continue progressive overload as per policy without signs/symptoms or physical distress.   Interval Training   Interval Training No      Nutrition:  Target Goals: Understanding of nutrition guidelines, daily intake of sodium <1551m, cholesterol <2017m calories 30% from fat and 7% or less from saturated fats, daily to have 5 or more servings of fruits and vegetables.  Biometrics:     Pre Biometrics - 05/14/15 1621    Pre Biometrics   Height 5' 1.75" (1.568 m)   Weight 204 lb 12.8 oz (92.897 kg)   Waist Circumference 38.5 inches   Hip Circumference 48 inches   Waist to Hip Ratio 0.8 %   BMI (Calculated) 37.8       Nutrition Therapy Plan and Nutrition Goals:   Nutrition Discharge: Rate Your Plate Scores:   Nutrition Goals Re-Evaluation:   Psychosocial: Target Goals: Acknowledge presence or absence of depression, maximize coping skills, provide positive support system. Participant is able to verbalize types and ability to use techniques and skills needed for reducing stress and depression.  Initial Review & Psychosocial Screening:     Initial Psych Review & Screening - 05/14/15 1905    Initial Review   Current issues with Current Sleep Concerns;Current Stress Concerns   Source of Stress Concerns Financial;Transportation   Family Dynamics   Good Support System? Yes   Comments Children are helping Claire Mcdonald this time   Barriers   Psychosocial barriers to participate in program There are no identifiable  barriers or psychosocial needs.;The patient should benefit from training in stress management and relaxation.   Screening Interventions   Interventions Encouraged to exercise;Program counselor consult      Quality of Life Scores:     Quality of Life - 05/14/15 1852    Quality of Life Scores   Health/Function Pre 14.57 %   Socioeconomic Pre 23.14 %   Psych/Spiritual Pre 30 %   Family Pre 28.5 %  GLOBAL Pre 21.56 %      PHQ-9:     Recent Review Flowsheet Data    Depression screen [facility] 2/9 05/14/2015 02/01/2015   Decreased Interest 0 0   Down, Depressed, Hopeless 3 0   PHQ - 2 Score 3 0   Altered sleeping 3 -   Tired, decreased energy 3 -   Change in appetite 3 -   Feeling bad or failure about yourself  0 -   Trouble concentrating 3 -   Moving slowly or fidgety/restless 3 -   Suicidal thoughts 0 -   PHQ-9 Score 18 -   Difficult doing work/chores Not difficult at all -      Psychosocial Evaluation and Intervention:     Psychosocial Evaluation - 09/05/15 0949    Psychosocial Evaluation & Interventions   Comments Counselor met with Claire Mcdonald today for initial psychosocial evaluation.  She is a 50 year old female with multiple health issues, including chronic pain, a seizure disorder, an upcoming colonoscopy and possible hysterectomy on top of her recent cardiac arrest (7/31).  Claire Mcdonald has two adult children living in her home who help care for her, as well as a father and significant other close by, so she has a strong support system.  She reports a long history of not sleeping well  and currently states she does not have much appetite.   She reports a history of depression and anxiety and is currently on medications for this that are currently not working well as her PHQ-9 scores were 18, which is concerning.  Claire Mcdonald has multiple stressors currently with health issues and financial strain due to not being able to work.  Also, parenting is stressful for her at this time.  Ms.  Mcdonald is unable to drive for 6 months due to her seizure disorder.  She has goals for this program to lose weight and get healthier in general - hopefully get back to work soon.  She has home gym equipment that she will begin using once she is done with this program or even on the off days.  Counselor recommended that Claire Mcdonald speak with her Dr. about her current anti-depressant medications not working, as well as possibly considering ordering a sleep study due to her long history of not sleeping well.  She also mentioned addressing her chronic pain would be helpful, since this impacts her mood and ability to sleep.  Counselor also recommended Claire Mcdonald see the dietician for help addressing her weight loss goals.       Psychosocial Re-Evaluation:     Psychosocial Re-Evaluation      05/30/15 1845 06/06/15 1154 06/21/15 1415       Psychosocial Re-Evaluation   Interventions Encouraged to attend Cardiac Rehabilitation for the exercise;Stress management education Stress management education Encouraged to attend Cardiac Rehabilitation for the exercise     Comments Claire Mcdonald has the stressor of just being sick so unable to attend Cardiac Rehab the past 2 days.  Stress of being sick and missing still missing CR.  Has been unable to attend Cardiac Rehab lately and I left her a vm.         Vocational Rehabilitation: Provide vocational rehab assistance to qualifying candidates.   Vocational Rehab Evaluation & Intervention:     Vocational Rehab - 05/14/15 1440    Initial Vocational Rehab Evaluation & Intervention   Assessment shows need for Vocational Rehabilitation No      Education: Education Goals: Education classes will  be provided on a weekly basis, covering required topics. Participant will state understanding/return demonstration of topics presented.  Learning Barriers/Preferences:     Learning Barriers/Preferences - 05/14/15 1439    Learning Barriers/Preferences   Learning Barriers Sight    Learning Preferences Group Instruction      Education Topics: General Nutrition Guidelines/Fats and Fiber: -Group instruction provided by verbal, written material, models and posters to present the general guidelines for heart healthy nutrition. Gives an explanation and review of dietary fats and fiber.   Controlling Sodium/Reading Food Labels: -Group verbal and written material supporting the discussion of sodium use in heart healthy nutrition. Review and explanation with models, verbal and written materials for utilization of the food label.   Exercise Physiology & Risk Factors: - Group verbal and written instruction with models to review the exercise physiology of the cardiovascular system and associated critical values. Details cardiovascular disease risk factors and the goals associated with each risk factor.   Aerobic Exercise & Resistance Training: - Gives group verbal and written discussion on the health impact of inactivity. On the components of aerobic and resistive training programs and the benefits of this training and how to safely progress through these programs.   Flexibility, Balance, General Exercise Guidelines: - Provides group verbal and written instruction on the benefits of flexibility and balance training programs. Provides general exercise guidelines with specific guidelines to those with heart or lung disease. Demonstration and skill practice provided.   Stress Management: - Provides group verbal and written instruction about the health risks of elevated stress, cause of high stress, and healthy ways to reduce stress.          Cardiac Rehab from 09/05/2015 in [facility] Cardiac and Pulmonary Rehab   Date  09/05/15   Educator  [facility]   Instruction Review Code  2- meets goals/outcomes      Depression: - Provides group verbal and written instruction on the correlation between heart/lung disease and depressed mood, treatment options, and the stigmas associated with  seeking treatment.   Anatomy & Physiology of the Heart: - Group verbal and written instruction and models provide basic cardiac anatomy and physiology, with the coronary electrical and arterial systems. Review of: AMI, Angina, Valve disease, Heart Failure, Cardiac Arrhythmia, Pacemakers, and the ICD.   Cardiac Procedures: - Group verbal and written instruction and models to describe the testing methods done to diagnose heart disease. Reviews the outcomes of the test results. Describes the treatment choices: Medical Management, Angioplasty, or Coronary Bypass Surgery.   Cardiac Medications: - Group verbal and written instruction to review commonly prescribed medications for heart disease. Reviews the medication, class of the drug, and side effects. Includes the steps to properly store meds and maintain the prescription regimen.   Go Sex-Intimacy & Heart Disease, Get SMART - Goal Setting: - Group verbal and written instruction through game format to discuss heart disease and the return to sexual intimacy. Provides group verbal and written material to discuss and apply goal setting through the application of the S.M.A.R.T. Method.   Other Matters of the Heart: - Provides group verbal, written materials and models to describe Heart Failure, Angina, Valve Disease, and Diabetes in the realm of heart disease. Includes description of the disease process and treatment options available to the cardiac patient.   Exercise & Equipment Safety: - Individual verbal instruction and demonstration of equipment use and safety with use of the equipment.      Cardiac Rehab from 09/05/2015 in [facility] Cardiac and Pulmonary  Rehab   Date  05/14/15   Educator  Sb   Instruction Review Code  2- meets goals/outcomes      Infection Prevention: - Provides verbal and written material to individual with discussion of infection control including proper hand washing and proper equipment cleaning during exercise session.       Cardiac Rehab from 09/05/2015 in [facility] Cardiac and Pulmonary Rehab   Date  05/14/15   Educator  SB   Instruction Review Code  2- meets goals/outcomes      Falls Prevention: - Provides verbal and written material to individual with discussion of falls prevention and safety.      Cardiac Rehab from 09/05/2015 in [facility] Cardiac and Pulmonary Rehab   Date  05/14/15   Educator  SB   Instruction Review Code  2- meets goals/outcomes      Diabetes: - Individual verbal and written instruction to review signs/symptoms of diabetes, desired ranges of glucose level fasting, after meals and with exercise. Advice that pre and post exercise glucose checks will be done for 3 sessions at entry of program.    Knowledge Questionnaire Score:     Knowledge Questionnaire Score - 05/14/15 1851    Knowledge Questionnaire Score   Pre Score 19/28      Personal Goals and Risk Factors at Admission:     Personal Goals and Risk Factors at Admission - 05/14/15 1442    Core Components/Risk Factors/Patient Goals on Admission    Weight Management Yes;Obesity   Intervention (read-only) Learn and follow the exercise and diet guidelines while in the program. Utilize the nutrition and education classes to help gain knowledge of the diet and exercise expectations in the program   Intervention (read-only) Provide weight management tools through evaluation completed by registered dietician and exercise physiologist.  Establish a goal weight with participant.   Admit Weight 204 lb (92.534 kg)   Goal Weight: Short Term 150 lb (68.04 kg)   Sedentary Yes   Intervention (read-only) While in program, learn and follow the exercise prescription taught. Start at a low level workload and increase workload after able to maintain previous level for 30 minutes. Increase time before increasing intensity.   Diabetes No   Hypertension Yes   Goal Participant will see blood pressure controlled within the values of 140/80m/Hg or  within value directed by their physician.   Intervention (read-only) Provide nutrition & aerobic exercise along with prescribed medications to achieve BP 140/90 or less.   Lipids Yes   Goal Cholesterol controlled with medications as prescribed, with individualized exercise RX and with personalized nutrition plan. Value goals: LDL < 715m HDL > 406mParticipant states understanding of desired cholesterol values and following prescriptions.   Intervention (read-only) Provide nutrition & aerobic exercise along with prescribed medications to achieve LDL <38m49mDL >40mg73mStress Yes   Goal To meet with psychosocial counselor for stress and relaxation information and guidance. To state understanding of performing relaxation techniques and or identifying personal stressors.   Intervention (read-only) Provide education on types of stress, identifiying stressors, and ways to cope with stress. Provide demonstration and active practice of relaxation techniques.   Understand more about Heart/Pulmonary Disease. Yes   Intervention While in program utilize professionals for any questions, and attend the education sessions. Great websites to use are www.americanheart.org or www.lung.org for reliable information.      Personal Goals and Risk Factors Review:      Goals and Risk Factor Review  05/30/15 1845 06/21/15 1414 09/11/15 0751 10/08/15 1106 11/09/15 1349   Core Components/Risk Factors/Patient Goals Review   Personal Goals Review Increase Aerobic Exercise and Physical Activity    Sedentary   Review     I left Claire Mcdonald a vm and asked her to let us know either way if she is plan on returning to Cardiac Rehab. Has not been here since Sep 07, 2015.   Expected Outcomes     To exercise in Cardiac Rehab.    Increase Aerobic Exercise and Physical Activity (read-only)   Goals Progress/Improvement seen  No No Yes No    Comments Unable to attend the past 2 days since she has been out sick.  Has been unable  to attend Cardiac Rehab lately and I left her a vm.  Made slight progression with exercise goals. Claire Mcdonald only game twice during this 30 day review. This inconsistent attendance makes progression difficult. We will follow up with her at her next visit on barriers to coming to class and what we can do to help.  Claire Mcdonald has been absent since 09/07/15. No progression will be made until her return. Ex. rx. will be re-evaluated when she returns based upon her physical capacity.        Personal Goals Discharge (Final Personal Goals and Risk Factors Review):      Goals and Risk Factor Review - 11/09/15 1349    Core Components/Risk Factors/Patient Goals Review   Personal Goals Review Sedentary   Review I left Claire Mcdonald a vm and asked her to let us know either way if she is plan on returning to Cardiac Rehab. Has not been here since Sep 07, 2015.   Expected Outcomes To exercise in Cardiac Rehab.       ITP Comments:     ITP Comments      07/30/15 1221 08/26/15 1237 09/20/15 1138 10/16/15 0813     ITP Comments 30 day review preparation: Continue with ITP. Claire Mcdonald has been absent this month. Had procedure done to replace her ICD wire. Plans to return this week. Ready for 30 day review.  Continue with ITP  HAs not returned since orientation 05/14/2015. Planned to return 12/28, but called in sick. Ready for 30 day review. Continue with ITP. Claire Mcdonald has attended 2 sessions since last review. 30 day review.  Continue with ITP. 4 sessions only, last visit 09/07/2015       Comments: I left Claire Mcdonald a vm and asked her to let us know either way if she is plan on returning to Cardiac Rehab. Has not been here since Sep 07, 2015.

## 2015-11-15 ENCOUNTER — Encounter: Payer: Self-pay | Admitting: *Deleted

## 2015-11-15 ENCOUNTER — Ambulatory Visit: Payer: Medicaid Other | Admitting: Pain Medicine

## 2015-11-15 DIAGNOSIS — I214 Non-ST elevation (NSTEMI) myocardial infarction: Secondary | ICD-10-CM

## 2015-11-15 NOTE — Progress Notes (Signed)
Cardiac Individual Treatment Plan  Patient Details  Name: CARRIN VANNOSTRAND MRN: [id number] Date of Birth: 1966-03-04 Referring Provider:  Deboraha Sprang, MD  Initial Encounter Date:       Cardiac Rehab from 05/14/2015 in [facility] Cardiac and Pulmonary Rehab   Date  05/14/15      Visit Diagnosis: Acute non-ST-elevation myocardial infarction [facility])  Patient's Home Medications on Admission:  Current outpatient prescriptions:  .  albuterol (PROVENTIL HFA;VENTOLIN HFA) 108 (90 BASE) MCG/ACT inhaler, Inhale 2 puffs into the lungs every 6 (six) hours as needed for wheezing or shortness of breath., Disp: 1 Inhaler, Rfl: 0 .  apixaban (ELIQUIS) 5 MG TABS tablet, Take 1 tablet (5 mg total) by mouth 2 (two) times daily., Disp: 60 tablet, Rfl: 12 .  atorvastatin (LIPITOR) 20 MG tablet, Take 1 tablet (20 mg total) by mouth daily at 6 PM., Disp: 90 tablet, Rfl: 1 .  dicyclomine (BENTYL) 20 MG tablet, Take 1 tablet (20 mg total) by mouth 3 (three) times daily as needed for spasms., Disp: 30 tablet, Rfl: 0 .  DULoxetine (CYMBALTA) 20 MG capsule, Take 20 mg by mouth daily., Disp: , Rfl:  .  DULoxetine (CYMBALTA) 60 MG capsule, Take 60 mg by mouth daily., Disp: , Rfl:  .  furosemide (LASIX) 20 MG tablet, Take 20 mg by mouth daily and may take an additional daily as directed, Disp: 90 tablet, Rfl: 3 .  hyoscyamine (LEVSIN SL) 0.125 MG SL tablet, Place 0.125 mg under the tongue every 4 (four) hours as needed., Disp: , Rfl:  .  levETIRAcetam (KEPPRA) 500 MG tablet, Take 1 tablet (500 mg total) by mouth 2 (two) times daily., Disp: 60 tablet, Rfl: 2 .  metoprolol tartrate (LOPRESSOR) 25 MG tablet, Take 0.5 tablets (12.5 mg total) by mouth 2 (two) times daily., Disp: 60 tablet, Rfl: 1 .  omeprazole (PRILOSEC) 20 MG capsule, Take 20 mg by mouth daily., Disp: , Rfl:  .  promethazine (PHENERGAN) 12.5 MG suppository, Place 1 suppository (12.5 mg total) rectally every 6 (six) hours as needed for nausea or vomiting., Disp: 12  each, Rfl: 0 .  sertraline (ZOLOFT) 100 MG tablet, Take 100 mg by mouth daily., Disp: , Rfl:  .  topiramate (TOPAMAX) 100 MG tablet, Take 1 tablet (100 mg total) by mouth 2 (two) times daily., Disp: 60 tablet, Rfl: 1  Past Medical History: Past Medical History  Diagnosis Date  . Hypertension   . High cholesterol   . Anxiety   . Depression   . Allergy   . GERD (gastroesophageal reflux disease)   . Migraine   . Diverticulosis     seen on CT 2015  . Obesity (BMI 30-39.9)   . DVT (deep venous thrombosis) ([place])   . 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 fib, EF nl, non-obs CAD; [employer] ICD  . Coronary artery disease   . DDD (degenerative disc disease), cervical   . Heart failure ([place])   . Hyperlipidemia   . Seizures ([place])   . Ventricular tachycardia ([facility])     Tobacco Use: History  Smoking status  . Never Smoker   Smokeless tobacco  . Never Used    Labs: Recent Review Flowsheet Data    Labs for ITP Cardiac and Pulmonary Rehab Latest Ref Rng 03/25/2015 03/25/2015 03/25/2015 03/26/2015 03/28/2015   Trlycerides <150 mg/dL - 133 - - 93   PHART 7.350 - 7.450 7.31(L) - 7.356 7.346(L) -   PCO2ART 35.0 -  45.0 mmHg 40 - 35.7 34.8(L) -   HCO3 20.0 - 24.0 mEq/L 20.1(L) - 19.3(L) 19.1(L) -   TCO2 0 - 100 mmol/L - - 20.4 20 -   ACIDBASEDEF 0.0 - 2.0 mmol/L 5.8(H) - 5.1(H) 6.0(H) -   O2SAT - 94.9 - 99.3 99.0 -       Exercise Target Goals:    Exercise Program Goal: Individual exercise prescription set with THRR, safety & activity barriers. Participant demonstrates ability to understand and report RPE using BORG scale, to self-measure pulse accurately, and to acknowledge the importance of the exercise prescription.  Exercise Prescription Goal: Starting with aerobic activity 30 plus minutes a day, 3 days per week for initial exercise prescription. Provide home exercise prescription and guidelines that participant acknowledges understanding prior to discharge.  Activity Barriers &  Risk Stratification:   6 Minute Walk:   Initial Exercise Prescription:   Perform Capillary Blood Glucose checks as needed.  Exercise Prescription Changes:     Exercise Prescription Changes      05/29/15 0600 06/26/15 0700 07/23/15 1400 08/21/15 0700 09/05/15 1100   Exercise Review   Progression No  Has not started exercise; medical review on 05/14/15 No  Has not started exercise; medical review on 05/14/15 No  Has not started exercise; medical review on 05/14/15 No  Has not started exercise; medical review on 05/14/15    Response to Exercise   Symptoms     None   Comments     First day of exercise! Patient was oriented to the gym and the equipment functions and settings. Procedures and policies of the gym were outlined and explained. The patient's individual exercise prescription and treatment plan were reviewed with them. All starting workloads were established based on the results of the functional testing  done at the initial intake visit. The plan for exercise progression was also introduced and progression will be customized based on the patient's performance and goals. There has been a several month gap between this visit and Seila's medical review. Due to this, her ex. Rx. Was reviewed and special care was taken during her exercise to insure the workloads were manageable.   Duration     Progress to 30 minutes of continuous aerobic without signs/symptoms of physical distress   Intensity     Other (comment)  Rest + 20   Progression   Progression     Continue progressive overload as per policy without signs/symptoms or physical distress.   Resistance Training   Training Prescription (read-only)     Yes   Weight (read-only)     2   Reps (read-only)     10-15   NuStep   Level (read-only)     2   Watts (read-only)     35   Minutes (read-only)     20     09/07/15 0746 10/08/15 1100 11/05/15 1200       Exercise Review   Progression Yes No  Absent since 09/07/15 No  Absent since  09/07/15     Response to Exercise   Blood Pressure (Admit) 140/80 mmHg       Blood Pressure (Exercise) 144/88 mmHg       Blood Pressure (Exit) 148/88 mmHg       Heart Rate (Admit) 113 bpm       Heart Rate (Exercise) 123 bpm       Heart Rate (Exit) 96 bpm       Rating of Perceived Exertion (Exercise) 9  Symptoms None       Comments Made slight progression with exercise goals. Janalynn only game twice during this 30 day review. This inconsistent attendance makes progression difficult. We will follow up with her at her next visit on barriers to coming to class and what we can do to help.  Absent since last review, workloads will be re-evaluated upon her return Absent since last review, workloads will be re-evaluated upon her return     Duration Progress to 30 minutes of continuous aerobic without signs/symptoms of physical distress Progress to 30 minutes of continuous aerobic without signs/symptoms of physical distress Progress to 30 minutes of continuous aerobic without signs/symptoms of physical distress     Intensity Other (comment)  Rest + 20 Other (comment)  Rest + 20 Other (comment)  Rest + 20     Progression   Progression Continue progressive overload as per policy without signs/symptoms or physical distress. Continue progressive overload as per policy without signs/symptoms or physical distress. Continue progressive overload as per policy without signs/symptoms or physical distress.     Resistance Training   Training Prescription (read-only) Yes Yes      Weight (read-only) 2 2      Reps (read-only) 10-15 10-15      Interval Training   Interval Training No No No     NuStep   Level (read-only) 2 2      Watts (read-only) 35 35      Minutes (read-only) 20 20      Biostep-RELP   Level (read-only) 2 2      Watts (read-only) 25 25      Minutes (read-only) 15 15         Exercise Comments:   Discharge Exercise Prescription (Final Exercise Prescription Changes):     Exercise  Prescription Changes - 11/05/15 1200    Exercise Review   Progression No  Absent since 09/07/15   Response to Exercise   Comments Absent since last review, workloads will be re-evaluated upon her return   Duration Progress to 30 minutes of continuous aerobic without signs/symptoms of physical distress   Intensity Other (comment)  Rest + 20   Progression   Progression Continue progressive overload as per policy without signs/symptoms or physical distress.   Interval Training   Interval Training No      Nutrition:  Target Goals: Understanding of nutrition guidelines, daily intake of sodium <1524m, cholesterol <2063m calories 30% from fat and 7% or less from saturated fats, daily to have 5 or more servings of fruits and vegetables.  Biometrics:    Nutrition Therapy Plan and Nutrition Goals:   Nutrition Discharge: Rate Your Plate Scores:   Nutrition Goals Re-Evaluation:   Psychosocial: Target Goals: Acknowledge presence or absence of depression, maximize coping skills, provide positive support system. Participant is able to verbalize types and ability to use techniques and skills needed for reducing stress and depression.  Initial Review & Psychosocial Screening:   Quality of Life Scores:   PHQ-9:     Recent Review Flowsheet Data    [facility] 05/14[facility]2015 02/01/2015   Decreased Interest 0 0   Down, Depressed, Hopeless 3 0   PHQ - 2 Score 3 0   Altered sleeping 3 -   Tired, decreased energy 3 -   Change in appetite 3 -   Feeling bad or failure about yourself  0 -   Trouble concentrating 3 -   Moving slowly or fidgety/restless 3 -  Suicidal thoughts 0 -   PHQ-9 Score 18 -   Difficult doing work/chores Not difficult at all -      Psychosocial Evaluation and Intervention:     Psychosocial Evaluation - 09/05/15 0949    Psychosocial Evaluation & Interventions   Comments Counselor met with Ms. Genna today for initial psychosocial evaluation.  She is  a 50 year old female with multiple health issues, including chronic pain, a seizure disorder, an upcoming colonoscopy and possible hysterectomy on top of her recent cardiac arrest (7/31).  Ms. Wass has two adult children living in her home who help care for her, as well as a father and significant other close by, so she has a strong support system.  She reports a long history of not sleeping well  and currently states she does not have much appetite.   She reports a history of depression and anxiety and is currently on medications for this that are currently not working well as her PHQ-9 scores were 18, which is concerning.  Ms. Alcaraz has multiple stressors currently with health issues and financial strain due to not being able to work.  Also, parenting is stressful for her at this time.  Ms. Beecham is unable to drive for 6 months due to her seizure disorder.  She has goals for this program to lose weight and get healthier in general - hopefully get back to work soon.  She has home gym equipment that she will begin using once she is done with this program or even on the off days.  Counselor recommended that Ms. Vanalstine speak with her Dr. about her current anti-depressant medications not working, as well as possibly considering ordering a sleep study due to her long history of not sleeping well.  She also mentioned addressing her chronic pain would be helpful, since this impacts her mood and ability to sleep.  Counselor also recommended Ms. Meares see the dietician for help addressing her weight loss goals.       Psychosocial Re-Evaluation:     Psychosocial Re-Evaluation      05/30/15 1845 06/06/15 1154 06/21/15 1415       Psychosocial Re-Evaluation   Interventions Encouraged to attend Cardiac Rehabilitation for the exercise;Stress management education Stress management education Encouraged to attend Cardiac Rehabilitation for the exercise     Comments Seleni has the stressor of just being sick so unable to attend  Cardiac Rehab the past 2 days.  Stress of being sick and missing still missing CR.  Has been unable to attend Cardiac Rehab lately and I left her a vm.         Vocational Rehabilitation: Provide vocational rehab assistance to qualifying candidates.   Vocational Rehab Evaluation & Intervention:   Education: Education Goals: Education classes will be provided on a weekly basis, covering required topics. Participant will state understanding/return demonstration of topics presented.  Learning Barriers/Preferences:   Education Topics: General Nutrition Guidelines/Fats and Fiber: -Group instruction provided by verbal, written material, models and posters to present the general guidelines for heart healthy nutrition. Gives an explanation and review of dietary fats and fiber.   Controlling Sodium/Reading Food Labels: -Group verbal and written material supporting the discussion of sodium use in heart healthy nutrition. Review and explanation with models, verbal and written materials for utilization of the food label.   Exercise Physiology & Risk Factors: - Group verbal and written instruction with models to review the exercise physiology of the cardiovascular system and associated critical values. Details cardiovascular  disease risk factors and the goals associated with each risk factor.   Aerobic Exercise & Resistance Training: - Gives group verbal and written discussion on the health impact of inactivity. On the components of aerobic and resistive training programs and the benefits of this training and how to safely progress through these programs.   Flexibility, Balance, General Exercise Guidelines: - Provides group verbal and written instruction on the benefits of flexibility and balance training programs. Provides general exercise guidelines with specific guidelines to those with heart or lung disease. Demonstration and skill practice provided.   Stress Management: - Provides group  verbal and written instruction about the health risks of elevated stress, cause of high stress, and healthy ways to reduce stress.          Cardiac Rehab from 09/05/2015 in [facility] Cardiac and Pulmonary Rehab   Date  09/05/15   Educator  [facility]   Instruction Review Code  2- meets goals/outcomes      Depression: - Provides group verbal and written instruction on the correlation between heart/lung disease and depressed mood, treatment options, and the stigmas associated with seeking treatment.   Anatomy & Physiology of the Heart: - Group verbal and written instruction and models provide basic cardiac anatomy and physiology, with the coronary electrical and arterial systems. Review of: AMI, Angina, Valve disease, Heart Failure, Cardiac Arrhythmia, Pacemakers, and the ICD.   Cardiac Procedures: - Group verbal and written instruction and models to describe the testing methods done to diagnose heart disease. Reviews the outcomes of the test results. Describes the treatment choices: Medical Management, Angioplasty, or Coronary Bypass Surgery.   Cardiac Medications: - Group verbal and written instruction to review commonly prescribed medications for heart disease. Reviews the medication, class of the drug, and side effects. Includes the steps to properly store meds and maintain the prescription regimen.   Go Sex-Intimacy & Heart Disease, Get SMART - Goal Setting: - Group verbal and written instruction through game format to discuss heart disease and the return to sexual intimacy. Provides group verbal and written material to discuss and apply goal setting through the application of the S.M.A.R.T. Method.   Other Matters of the Heart: - Provides group verbal, written materials and models to describe Heart Failure, Angina, Valve Disease, and Diabetes in the realm of heart disease. Includes description of the disease process and treatment options available to the cardiac patient.   Exercise &  Equipment Safety: - Individual verbal instruction and demonstration of equipment use and safety with use of the equipment.      Cardiac Rehab from 09/05/2015 in [facility] Cardiac and Pulmonary Rehab   Date  05/14/15   Educator  Sb   Instruction Review Code  2- meets goals/outcomes      Infection Prevention: - Provides verbal and written material to individual with discussion of infection control including proper hand washing and proper equipment cleaning during exercise session.      Cardiac Rehab from 09/05/2015 in [facility] Cardiac and Pulmonary Rehab   Date  05/14/15   Educator  SB   Instruction Review Code  2- meets goals/outcomes      Falls Prevention: - Provides verbal and written material to individual with discussion of falls prevention and safety.      Cardiac Rehab from 09/05/2015 in [facility] Cardiac and Pulmonary Rehab   Date  05/14/15   Educator  SB   Instruction Review Code  2- meets goals/outcomes      Diabetes: - Individual verbal and written  instruction to review signs/symptoms of diabetes, desired ranges of glucose level fasting, after meals and with exercise. Advice that pre and post exercise glucose checks will be done for 3 sessions at entry of program.    Knowledge Questionnaire Score:   Personal Goals and Risk Factors at Admission:   Personal Goals and Risk Factors Review:      Goals and Risk Factor Review      05/30/15 1845 06/21/15 1414 09/11/15 0751 10/08/15 1106 11/09/15 1349   Core Components/Risk Factors/Patient Goals Review   Personal Goals Review Increase Aerobic Exercise and Physical Activity    Sedentary   Review     I left Kameko Hukill a vm and asked her to let us know either way if she is plan on returning to Cardiac Rehab. Has not been here since Sep 07, 2015.   Expected Outcomes     To exercise in Cardiac Rehab.    Increase Aerobic Exercise and Physical Activity (read-only)   Goals Progress/Improvement seen  No No Yes No    Comments Unable to attend  the past 2 days since she has been out sick.  Has been unable to attend Cardiac Rehab lately and I left her a vm.  Made slight progression with exercise goals. Kyasia only game twice during this 30 day review. This inconsistent attendance makes progression difficult. We will follow up with her at her next visit on barriers to coming to class and what we can do to help.  Keylah has been absent since 09/07/15. No progression will be made until her return. Ex. rx. will be re-evaluated when she returns based upon her physical capacity.        Personal Goals Discharge (Final Personal Goals and Risk Factors Review):      Goals and Risk Factor Review - 11/09/15 1349    Core Components/Risk Factors/Patient Goals Review   Personal Goals Review Sedentary   Review I left Tracina Beaumont a vm and asked her to let us know either way if she is plan on returning to Cardiac Rehab. Has not been here since Sep 07, 2015.   Expected Outcomes To exercise in Cardiac Rehab.       ITP Comments:     ITP Comments      07/30/15 1221 08/26/15 1237 09/20/15 1138 10/16/15 0813 11/15/15 1305   ITP Comments 30 day review preparation: Continue with ITP. Kynlea has been absent this month. Had procedure done to replace her ICD wire. Plans to return this week. Ready for 30 day review.  Continue with ITP  HAs not returned since orientation 05/14/2015. Planned to return 12/28, but called in sick. Ready for 30 day review. Continue with ITP. Akane has attended 2 sessions since last review. 30 day review.  Continue with ITP. 4 sessions only, last visit 09/07/2015 30 Day Review. Continue with the ITP.  HAS been absent since 1/13. Has been called with no response to [place] yet.       Comments:

## 2015-11-20 ENCOUNTER — Ambulatory Visit: Payer: Medicaid Other | Attending: Pain Medicine | Admitting: Pain Medicine

## 2015-11-20 ENCOUNTER — Encounter: Payer: Self-pay | Admitting: Pain Medicine

## 2015-11-20 VITALS — BP 119/88 | HR 78 | Temp 97.8°F | Resp 16 | Ht 62.0 in | Wt 214.0 lb

## 2015-11-20 DIAGNOSIS — M7060 Trochanteric bursitis, unspecified hip: Secondary | ICD-10-CM | POA: Insufficient documentation

## 2015-11-20 DIAGNOSIS — M5136 Other intervertebral disc degeneration, lumbar region: Secondary | ICD-10-CM | POA: Insufficient documentation

## 2015-11-20 DIAGNOSIS — M533 Sacrococcygeal disorders, not elsewhere classified: Secondary | ICD-10-CM

## 2015-11-20 DIAGNOSIS — M5137 Other intervertebral disc degeneration, lumbosacral region: Secondary | ICD-10-CM

## 2015-11-20 DIAGNOSIS — Z7901 Long term (current) use of anticoagulants: Secondary | ICD-10-CM | POA: Insufficient documentation

## 2015-11-20 DIAGNOSIS — M79606 Pain in leg, unspecified: Secondary | ICD-10-CM | POA: Diagnosis present

## 2015-11-20 DIAGNOSIS — M4316 Spondylolisthesis, lumbar region: Secondary | ICD-10-CM | POA: Diagnosis not present

## 2015-11-20 DIAGNOSIS — M545 Low back pain: Secondary | ICD-10-CM | POA: Diagnosis present

## 2015-11-20 DIAGNOSIS — M47816 Spondylosis without myelopathy or radiculopathy, lumbar region: Secondary | ICD-10-CM | POA: Diagnosis not present

## 2015-11-20 NOTE — Progress Notes (Signed)
Safety precautions to be maintained throughout the outpatient stay will include: orient to surroundings, keep bed in low position, maintain call bell within reach at all times, provide assistance with transfer out of bed and ambulation.  

## 2015-11-20 NOTE — Progress Notes (Signed)
Subjective:    Patient ID: Claire Mcdonald, female    DOB: Apr 07, 1966, 50 y.o.   MRN: [id number]  HPI  The patient is a 50 year old female who returns to pain management for further evaluation and treatment of pain involving the lower back and lower extremity region. The patient states that she has had return of significant lower back and lower extremity pain this time and that she is in hopes of being able to undergo interventional treatment in an attempt to decrease severity of her symptoms, minimize progression of symptoms, and avoid the need for more involved treatment. The patient stated that she had episode at work due to electrolyte imbalance apparently causing patient to pass out. The patient required resuscitation and eventually had defibrillator placed. We discussed patient's condition on today's visit and informed patient that we would the medical clearance from her physician Dr. Filomena Jungling to perform lumbar facet, medial branch nerve, blocks with local anesthetic bupivacaine and steroid Kenalog. We informed patient that we would need permission to interrupt anticoagulant therapy as well. We informed patient that we would only consider performing the procedure if she had complete clearance for primary care physician. We will await response of Dr. Filomena Jungling regarding medical clearance and regarding permission to interrupt anticoagulant therapy as well.       Review of Systems     Objective:   Physical Exam  There was tenderness of the splenius capitis and occipitalis muscles region a mild degree with mild tenderness of the cervical facet cervical paraspinal musculature region. Palpation over the thoracic facet thoracic paraspinal musculature region was attends to palpation of mild to moderate degree wit  mild to moderate muscle spasms of the lower thoracic paraspinal musculature region. The patient appeared to be with slightly decreased grip strength with Tinel and Phalen's maneuver  reproducing minimal discomfort. The patient was a tennis to palpation of the acromioclavicular and glenohumeral joint regions of mild degree with apparently unremarkable Spurling's maneuver. Palpation over the lumbar paraspinal musculatures and lumbar facet region was attends to palpation of moderate degree with lateral bending rotation extension and palpation over the lumbar facets reproducing moderately severe discomfort. There was tenderness over the PSIS and PII S region of moderate degree. Palpation of the greater trochanteric region and iliotibial band region associated with mild to moderate tenderness. Straight leg raise was tolerates approximately 30 without a definite increase of pain with dorsiflexion noted. DTRs were difficult to elicit. There was no definite sensory deficit or dermatomal distribution detected. There was negative clonus negative Homans. Abdomen was protuberant with no excessive tends to palpation and no costovertebral tenderness was noted     Assessment & Plan:     Degenerative disc disease lumbar spine Degenerative changes lumbar spine, grade 2 anterolisthesis, L5 on S1, bilateral spondylolisthesis, L5 degenerative changes, L5-S1 with degeneration with loss of disc height [place] of disc height  Lumbar Facet syndrome  Sacroiliac joint dysfunction  Greater trochanteric bursitis     PLAN  Continue present medications  Lumbar facet, medial branch nerve blocks to be performed at time of return appointment ONLY IF  Dr. Filomena Jungling at [place] [phone] medically clears you to stop your anticoagulant medications Eliquis and states that you are safe to have the steroid and local anesthetic injection for the lumbar facet medial branch nerve blocks  F/U PCP Dr. Filomena Jungling  for evaliation of  BP and general medical  Condition and for medical clearance for lumbar facet, medial branch nerve,  blocks as well as permission to interrupt anticoagulant therapy for the  procedure.  F/U surgical evaluation. May consider pending follow-up evaluation  F/U cardiologist  F/U neurological evaluation. May consider [facility] EMG studies and other studies  May consider radiofrequency rhizolysis or intraspinal procedures pending response to present treatment and F/U evaluation.  Patient to call [facility] should patient have concerns prior to scheduled return appointment.

## 2015-11-20 NOTE — Patient Instructions (Addendum)
Continue present medications  Lumbar facet, medial branch nerve blocks to be performed at time of return appointment ONLY IF Dr. Filomena Jungling at [facility] [phone] medically clears you to stop your anticoagulant medications Eliquis and states that you are safe to have the steroid and local anesthetic injection for the lumbar facet medial branch nerve blocks  F/U PCP Dr. Filomena Jungling  for evaliation of  BP and general medical  condition.  F/U surgical evaluation. May consider pending follow-up evaluation  F/U cardiologist  F/U neurological evaluation. May consider [facility] EMG studies and other studies  May consider radiofrequency rhizolysis or intraspinal procedures pending response to present treatment and F/U evaluation.  Patient to call Pain Management Center should patient have concerns prior to scheduled return appointment. GENERAL RISKS AND COMPLICATIONS  What are the risk, side effects and possible complications? Generally speaking, most procedures are safe.  However, with any procedure there are risks, side effects, and the possibility of complications.  The risks and complications are dependent upon the sites that are lesioned, or the type of nerve block to be performed.  The closer the procedure is to the spine, the more serious the risks are.  Great care is taken when placing the radio frequency needles, block needles or lesioning probes, but sometimes complications can occur. 1. Infection: Any time there is an injection through the skin, there is a risk of infection.  This is why sterile conditions are used for these blocks.  There are four possible types of infection. 1. Localized skin infection. 2. Central Nervous System Infection-This can be in the form of Meningitis, which can be deadly. 3. Epidural Infections-This can be in the form of an epidural abscess, which can cause pressure inside of the spine, causing compression of the spinal cord with subsequent paralysis. This  would require an emergency surgery to decompress, and there are no guarantees that the patient would recover from the paralysis. 4. Discitis-This is an infection of the intervertebral discs.  It occurs in about 1% of discography procedures.  It is difficult to treat and it may lead to surgery.        2. Pain: the needles have to go through skin and soft tissues, will cause soreness.       3. Damage to internal structures:  The nerves to be lesioned may be near blood vessels or    other nerves which can be potentially damaged.       4. Bleeding: Bleeding is more common if the patient is taking blood thinners such as  aspirin, Coumadin, Ticiid, Plavix, etc., or if he/she have some genetic predisposition  such as hemophilia. Bleeding into the spinal canal can cause compression of the spinal  cord with subsequent paralysis.  This would require an emergency surgery to  decompress and there are no guarantees that the patient would recover from the  paralysis.       5. Pneumothorax:  Puncturing of a lung is a possibility, every time a needle is introduced in  the area of the chest or upper back.  Pneumothorax refers to free air around the  collapsed lung(s), inside of the thoracic cavity (chest cavity).  Another two possible  complications related to a similar event would include: Hemothorax and Chylothorax.   These are variations of the Pneumothorax, where instead of air around the collapsed  lung(s), you may have blood or chyle, respectively.       6. Spinal headaches: They may occur with any procedures  in the area of the spine.       7. Persistent CSF (Cerebro-Spinal Fluid) leakage: This is a rare problem, but may occur  with prolonged intrathecal or epidural catheters either due to the formation of a fistulous  track or a dural tear.       8. Nerve damage: By working so close to the spinal cord, there is always a possibility of  nerve damage, which could be as serious as a permanent spinal cord injury with   paralysis.       9. Death:  Although rare, severe deadly allergic reactions known as "Anaphylactic  reaction" can occur to any of the medications used.      10. Worsening of the symptoms:  We can always make thing worse.  What are the chances of something like this happening? Chances of any of this occuring are extremely low.  By statistics, you have more of a chance of getting killed in a motor vehicle accident: while driving to the hospital than any of the above occurring .  Nevertheless, you should be aware that they are possibilities.  In general, it is similar to taking a shower.  Everybody knows that you can slip, hit your head and get killed.  Does that mean that you should not shower again?  Nevertheless always keep in mind that statistics do not mean anything if you happen to be on the wrong side of them.  Even if a procedure has a 1 (one) in a 1,000,000 (million) chance of going wrong, it you happen to be that one..Also, keep in mind that by statistics, you have more of a chance of having something go wrong when taking medications.  Who should not have this procedure? If you are on a blood thinning medication (e.g. Coumadin, Plavix, see list of "Blood Thinners"), or if you have an active infection going on, you should not have the procedure.  If you are taking any blood thinners, please inform your physician.  How should I prepare for this procedure?  Do not eat or drink anything at least six hours prior to the procedure.  Bring a driver with you .  It cannot be a taxi.  Come accompanied by an adult that can drive you back, and that is strong enough to help you if your legs get weak or numb from the local anesthetic.  Take all of your medicines the morning of the procedure with just enough water to swallow them.  If you have diabetes, make sure that you are scheduled to have your procedure done first thing in the morning, whenever possible.  If you have diabetes, take only half of  your insulin dose and notify our nurse that you have done so as soon as you arrive at the clinic.  If you are diabetic, but only take blood sugar pills (oral hypoglycemic), then do not take them on the morning of your procedure.  You may take them after you have had the procedure.  Do not take aspirin or any aspirin-containing medications, at least eleven (11) days prior to the procedure.  They may prolong bleeding.  Wear loose fitting clothing that may be easy to take off and that you would not mind if it got stained with Betadine or blood.  Do not wear any jewelry or perfume  Remove any nail coloring.  It will interfere with some of our monitoring equipment.  NOTE: Remember that this is not meant to be interpreted as a complete list  of all possible complications.  Unforeseen problems may occur.  BLOOD THINNERS The following drugs contain aspirin or other products, which can cause increased bleeding during surgery and should not be taken for 2 weeks prior to and 1 week after surgery.  If you should need take something for relief of minor pain, you may take acetaminophen which is found in Tylenol,m Datril, Anacin-3 and Panadol. It is not blood thinner. The products listed below are.  Do not take any of the products listed below in addition to any listed on your instruction sheet.  A.P.C or A.P.C with Codeine Codeine Phosphate Capsules #3 Ibuprofen Ridaura  ABC compound Congesprin Imuran rimadil  Advil Cope Indocin Robaxisal  Alka-Seltzer Effervescent Pain Reliever and Antacid Coricidin or Coricidin-D  Indomethacin Rufen  Alka-Seltzer plus Cold Medicine Cosprin Ketoprofen S-A-C Tablets  Anacin Analgesic Tablets or Capsules Coumadin Korlgesic Salflex  Anacin Extra Strength Analgesic tablets or capsules CP-2 Tablets Lanoril Salicylate  Anaprox Cuprimine Capsules Levenox Salocol  Anexsia-D Dalteparin Magan Salsalate  Anodynos Darvon compound Magnesium Salicylate Sine-off  Ansaid Dasin  Capsules Magsal Sodium Salicylate  Anturane Depen Capsules Marnal Soma  APF Arthritis pain formula Dewitt's Pills Measurin Stanback  Argesic Dia-Gesic Meclofenamic Sulfinpyrazone  Arthritis Bayer Timed Release Aspirin Diclofenac Meclomen Sulindac  Arthritis pain formula Anacin Dicumarol Medipren Supac  Analgesic (Safety coated) Arthralgen Diffunasal Mefanamic Suprofen  Arthritis Strength Bufferin Dihydrocodeine Mepro Compound Suprol  Arthropan liquid Dopirydamole Methcarbomol with Aspirin Synalgos  ASA tablets/Enseals Disalcid Micrainin Tagament  Ascriptin Doan's Midol Talwin  Ascriptin A/D Dolene Mobidin Tanderil  Ascriptin Extra Strength Dolobid Moblgesic Ticlid  Ascriptin with Codeine Doloprin or Doloprin with Codeine Momentum Tolectin  Asperbuf Duoprin Mono-gesic Trendar  Aspergum Duradyne Motrin or Motrin IB Triminicin  Aspirin plain, buffered or enteric coated Durasal Myochrisine Trigesic  Aspirin Suppositories Easprin Nalfon Trillsate  Aspirin with Codeine Ecotrin Regular or Extra Strength Naprosyn Uracel  Atromid-S Efficin Naproxen Ursinus  Auranofin Capsules Elmiron Neocylate Vanquish  Axotal Emagrin Norgesic Verin  Azathioprine Empirin or Empirin with Codeine Normiflo Vitamin E  Azolid Emprazil Nuprin Voltaren  Bayer Aspirin plain, buffered or children's or timed BC Tablets or powders Encaprin Orgaran Warfarin Sodium  Buff-a-Comp Enoxaparin Orudis Zorpin  Buff-a-Comp with Codeine Equegesic Os-Cal-Gesic   Buffaprin Excedrin plain, buffered or Extra Strength Oxalid   Bufferin Arthritis Strength Feldene Oxphenbutazone   Bufferin plain or Extra Strength Feldene Capsules Oxycodone with Aspirin   Bufferin with Codeine Fenoprofen Fenoprofen Pabalate or Pabalate-SF   Buffets II Flogesic Panagesic   Buffinol plain or Extra Strength Florinal or Florinal with Codeine Panwarfarin   Buf-Tabs Flurbiprofen Penicillamine   Butalbital Compound Four-way cold tablets Penicillin    Butazolidin Fragmin Pepto-Bismol   Carbenicillin Geminisyn Percodan   Carna Arthritis Reliever Geopen Persantine   Carprofen Gold's salt Persistin   Chloramphenicol Goody's Phenylbutazone   Chloromycetin Haltrain Piroxlcam   Clmetidine heparin Plaquenil   Cllnoril Hyco-pap Ponstel   Clofibrate Hydroxy chloroquine Propoxyphen         Before stopping any of these medications, be sure to consult the physician who ordered them.  Some, such as Coumadin (Warfarin) are ordered to prevent or treat serious conditions such as "deep thrombosis", "pumonary embolisms", and other heart problems.  The amount of time that you may need off of the medication may also vary with the medication and the reason for which you were taking it.  If you are taking any of these medications, please make sure you notify your pain physician before you undergo any  procedures.         Facet Joint Block The facet joints connect the bones of the spine (vertebrae). They make it possible for you to bend, twist, and make other movements with your spine. They also prevent you from overbending, overtwisting, and making other excessive movements.  A facet joint block is a procedure where a numbing medicine (anesthetic) is injected into a facet joint. Often, a type of anti-inflammatory medicine called a steroid is also injected. A facet joint block may be done for two reasons:  2. Diagnosis. A facet joint block may be done as a test to see whether neck or back pain is caused by a worn-down or infected facet joint. If the pain gets better after a facet joint block, it means the pain is probably coming from the facet joint. If the pain does not get better, it means the pain is probably not coming from the facet joint.  3. Therapy. A facet joint block may be done to relieve neck or back pain caused by a facet joint. A facet joint block is only done as a therapy if the pain does not improve with medicine, exercise programs, physical  therapy, and other forms of pain management. LET [facility] CARE PROVIDER KNOW ABOUT:   Any allergies you have.   All medicines you are taking, including vitamins, herbs, eyedrops, and over-the-counter medicines and creams.   Previous problems you or members of your family have had with the use of anesthetics.   Any blood disorders you have had.   Other health problems you have. RISKS AND COMPLICATIONS Generally, having a facet joint block is safe. However, as with any procedure, complications can occur. Possible complications associated with having a facet joint block include:   Bleeding.   Injury to a nerve near the injection site.   Pain at the injection site.   Weakness or numbness in areas controlled by nerves near the injection site.   Infection.   Temporary fluid retention.   Allergic reaction to anesthetics or medicines used during the procedure. BEFORE THE PROCEDURE   Follow your health care provider's instructions if you are taking dietary supplements or medicines. You may need to stop taking them or reduce your dosage.   Do not take any new dietary supplements or medicines without asking your health care provider first.   Follow your health care provider's instructions about eating and drinking before the procedure. You may need to stop eating and drinking several hours before the procedure.   Arrange to have an adult drive you home after the procedure. PROCEDURE 12. You may need to remove your clothing and dress in an open-back gown so that your health care provider can access your spine.  13. The procedure will be done while you are lying on an X-ray table. Most of the time you will be asked to lie on your stomach, but you may be asked to lie in a different position if an injection will be made in your neck.  14. Special machines will be used to monitor your oxygen levels, heart rate, and blood pressure.  15. If an injection will be made in your  neck, an intravenous (IV) tube will be inserted into one of your veins. Fluids and medicine will flow directly into your body through the IV tube.  16. The area over the facet joint where the injection will be made will be cleaned with an antiseptic soap. The surrounding skin will be covered with sterile drapes.  17. An anesthetic will be applied to your skin to make the injection area numb. You may feel a temporary stinging or burning sensation.  18. A video X-ray machine will be used to locate the joint. A contrast dye may be injected into the facet joint area to help with locating the joint.  19. When the joint is located, an anesthetic medicine will be injected into the joint through the needle.  80. Your health care provider will ask you whether you feel pain relief. If you do feel relief, a steroid may be injected to provide pain relief for a longer period of time. If you do not feel relief or feel only partial relief, additional injections of an anesthetic may be made in other facet joints.  21. The needle will be removed, the skin will be cleansed, and bandages will be applied.  AFTER THE PROCEDURE   You will be observed for 15-30 minutes before being allowed to go home. Do not drive. Have an adult drive you or take a taxi or public transportation instead.   If you feel pain relief, the pain will return in several hours or days when the anesthetic wears off.   You may feel pain relief 2-14 days after the procedure. The amount of time this relief lasts varies from person to person.   It is normal to feel some tenderness over the injected area(s) for 2 days following the procedure.   If you have diabetes, you may have a temporary increase in blood sugar.   This information is not intended to replace advice given to you by your health care provider. Make sure you discuss any questions you have with your health care provider.   Document Released: 12/31/2006 Document Revised:  09/01/2014 Document Reviewed: 05/31/2012 Elsevier Interactive Patient Education [employer]. The patient was with understanding and agreed to suggested treatment plan

## 2015-11-22 ENCOUNTER — Encounter: Payer: Medicaid Other | Admitting: Nurse Practitioner

## 2015-11-22 ENCOUNTER — Telehealth: Payer: Self-pay | Admitting: *Deleted

## 2015-11-22 ENCOUNTER — Other Ambulatory Visit: Payer: Self-pay | Admitting: Pain Medicine

## 2015-11-22 DIAGNOSIS — M47816 Spondylosis without myelopathy or radiculopathy, lumbar region: Secondary | ICD-10-CM

## 2015-11-22 DIAGNOSIS — Z7901 Long term (current) use of anticoagulants: Secondary | ICD-10-CM

## 2015-11-22 DIAGNOSIS — M533 Sacrococcygeal disorders, not elsewhere classified: Secondary | ICD-10-CM

## 2015-11-22 DIAGNOSIS — M5136 Other intervertebral disc degeneration, lumbar region: Secondary | ICD-10-CM

## 2015-11-22 DIAGNOSIS — M5137 Other intervertebral disc degeneration, lumbosacral region: Secondary | ICD-10-CM

## 2015-11-26 ENCOUNTER — Other Ambulatory Visit
Admission: RE | Admit: 2015-11-26 | Discharge: 2015-11-26 | Disposition: A | Discharge status: Home / Self Care | Payer: Medicaid Other | Source: Ambulatory Visit | Attending: Pain Medicine | Admitting: Pain Medicine

## 2015-11-26 ENCOUNTER — Ambulatory Visit: Payer: Medicaid Other | Admitting: Pain Medicine

## 2015-11-26 ENCOUNTER — Telehealth: Payer: Self-pay | Admitting: *Deleted

## 2015-11-26 DIAGNOSIS — M47816 Spondylosis without myelopathy or radiculopathy, lumbar region: Secondary | ICD-10-CM | POA: Insufficient documentation

## 2015-11-26 DIAGNOSIS — M5136 Other intervertebral disc degeneration, lumbar region: Secondary | ICD-10-CM | POA: Insufficient documentation

## 2015-11-26 DIAGNOSIS — M5137 Other intervertebral disc degeneration, lumbosacral region: Secondary | ICD-10-CM | POA: Insufficient documentation

## 2015-11-26 DIAGNOSIS — Z7901 Long term (current) use of anticoagulants: Secondary | ICD-10-CM | POA: Insufficient documentation

## 2015-11-26 DIAGNOSIS — M533 Sacrococcygeal disorders, not elsewhere classified: Secondary | ICD-10-CM | POA: Insufficient documentation

## 2015-11-26 LAB — PROTIME-INR
INR: 0.97
PROTHROMBIN TIME: 13.1 s (ref 11.4–15.0)

## 2015-11-26 LAB — APTT: aPTT: 26 seconds (ref 24–36)

## 2015-11-26 NOTE — Telephone Encounter (Signed)
Left voicemail for patient that lab results from 11/26/15 were normal.

## 2015-11-28 ENCOUNTER — Ambulatory Visit: Payer: Medicaid Other | Admitting: Pain Medicine

## 2015-12-05 NOTE — Telephone Encounter (Signed)
Closed encounter °

## 2015-12-10 DIAGNOSIS — F331 Major depressive disorder, recurrent, moderate: Secondary | ICD-10-CM | POA: Insufficient documentation

## 2015-12-16 ENCOUNTER — Encounter: Payer: Self-pay | Admitting: *Deleted

## 2015-12-16 DIAGNOSIS — I214 Non-ST elevation (NSTEMI) myocardial infarction: Secondary | ICD-10-CM

## 2015-12-16 NOTE — Progress Notes (Signed)
Cardiac Individual Treatment Plan  Patient Details  Name: Claire Mcdonald MRN: [id number] Date of Birth: 1966-01-12 Referring Provider:    Initial Encounter Date:       Cardiac Rehab from 05/14/2015 in [facility] Cardiac and Pulmonary Rehab   Date  05/14/15      Visit Diagnosis: Acute non-ST-elevation myocardial infarction [facility])  Patient's Home Medications on Admission:  Current outpatient prescriptions:  .  albuterol (PROVENTIL HFA;VENTOLIN HFA) 108 (90 BASE) MCG/ACT inhaler, Inhale 2 puffs into the lungs every 6 (six) hours as needed for wheezing or shortness of breath., Disp: 1 Inhaler, Rfl: 0 .  apixaban (ELIQUIS) 5 MG TABS tablet, Take 1 tablet (5 mg total) by mouth 2 (two) times daily., Disp: 60 tablet, Rfl: 12 .  atorvastatin (LIPITOR) 20 MG tablet, Take 1 tablet (20 mg total) by mouth daily at 6 PM., Disp: 90 tablet, Rfl: 1 .  cyclobenzaprine (FLEXERIL) 10 MG tablet, Take 10 mg by mouth 3 (three) times daily as needed for muscle spasms., Disp: , Rfl:  .  dicyclomine (BENTYL) 20 MG tablet, Take 1 tablet (20 mg total) by mouth 3 (three) times daily as needed for spasms., Disp: 30 tablet, Rfl: 0 .  DULoxetine (CYMBALTA) 20 MG capsule, Take 20 mg by mouth daily. Reported on 11/20/2015, Disp: , Rfl:  .  DULoxetine (CYMBALTA) 60 MG capsule, Take 60 mg by mouth daily. Reported on 11/20/2015, Disp: , Rfl:  .  furosemide (LASIX) 20 MG tablet, Take 20 mg by mouth daily and may take an additional daily as directed, Disp: 90 tablet, Rfl: 3 .  hyoscyamine (LEVSIN SL) 0.125 MG SL tablet, Place 0.125 mg under the tongue every 4 (four) hours as needed. Reported on 11/20/2015, Disp: , Rfl:  .  levETIRAcetam (KEPPRA) 500 MG tablet, Take 1 tablet (500 mg total) by mouth 2 (two) times daily., Disp: 60 tablet, Rfl: 2 .  metoprolol tartrate (LOPRESSOR) 25 MG tablet, Take 0.5 tablets (12.5 mg total) by mouth 2 (two) times daily., Disp: 60 tablet, Rfl: 1 .  omeprazole (PRILOSEC) 20 MG capsule, Take 20 mg by mouth  daily., Disp: , Rfl:  .  promethazine (PHENERGAN) 12.5 MG suppository, Place 1 suppository (12.5 mg total) rectally every 6 (six) hours as needed for nausea or vomiting. (Patient not taking: Reported on 11/20/2015), Disp: 12 each, Rfl: 0 .  sertraline (ZOLOFT) 100 MG tablet, Take 100 mg by mouth daily., Disp: , Rfl:  .  topiramate (TOPAMAX) 100 MG tablet, Take 1 tablet (100 mg total) by mouth 2 (two) times daily., Disp: 60 tablet, Rfl: 1 .  traZODone (DESYREL) 50 MG tablet, Take 50 mg by mouth at bedtime as needed for sleep., Disp: , Rfl:   Past Medical History: Past Medical History  Diagnosis Date  . Hypertension   . High cholesterol   . Anxiety   . Depression   . Allergy   . GERD (gastroesophageal reflux disease)   . Migraine   . Diverticulosis     seen on CT 2015  . Obesity (BMI 30-39.9)   . DVT (deep venous thrombosis) ([place])   . Idiopathic cardiac arrest ([place]) 02/2015    V. fib, EF nl, non-obs CAD; [employer] ICD  . Coronary artery disease   . DDD (degenerative disc disease), cervical   . Heart failure ([place])   . Hyperlipidemia   . Seizures ([place])   . Ventricular tachycardia ([facility])     Tobacco Use: History  Smoking status  . Never Smoker  Smokeless tobacco  . Never Used    Labs: Recent Review Flowsheet Data    Labs for ITP Cardiac and Pulmonary Rehab Latest Ref Rng 03/25/2015 03/25/2015 03/25/2015 03/26/2015 03/28/2015   Trlycerides <150 mg/dL - 133 - - 93   PHART 7.350 - 7.450 7.31(L) - 7.356 7.346(L) -   PCO2ART 35.0 - 45.0 mmHg 40 - 35.7 34.8(L) -   HCO3 20.0 - 24.0 mEq/L 20.1(L) - 19.3(L) 19.1(L) -   TCO2 0 - 100 mmol/L - - 20.4 20 -   ACIDBASEDEF 0.0 - 2.0 mmol/L 5.8(H) - 5.1(H) 6.0(H) -   O2SAT - 94.9 - 99.3 99.0 -       Exercise Target Goals:    Exercise Program Goal: Individual exercise prescription set with THRR, safety & activity barriers. Participant demonstrates ability to understand and report RPE using BORG scale, to self-measure pulse accurately, and to  acknowledge the importance of the exercise prescription.  Exercise Prescription Goal: Starting with aerobic activity 30 plus minutes a day, 3 days per week for initial exercise prescription. Provide home exercise prescription and guidelines that participant acknowledges understanding prior to discharge.  Activity Barriers & Risk Stratification:   6 Minute Walk:   Initial Exercise Prescription:   Perform Capillary Blood Glucose checks as needed.  Exercise Prescription Changes:     Exercise Prescription Changes      06/26/15 0700 07/23/15 1400 08/21/15 0700 09/05/15 1100 09/07/15 0746   Exercise Review   Progression No  Has not started exercise; medical review on 05/14/15 No  Has not started exercise; medical review on 05/14/15 No  Has not started exercise; medical review on 05/14/15  Yes   Response to Exercise   Blood Pressure (Admit)     140/80 mmHg   Blood Pressure (Exercise)     144/88 mmHg   Blood Pressure (Exit)     148/88 mmHg   Heart Rate (Admit)     113 bpm   Heart Rate (Exercise)     123 bpm   Heart Rate (Exit)     96 bpm   Rating of Perceived Exertion (Exercise)     9   Symptoms    None None   Comments    First day of exercise! Patient was oriented to the gym and the equipment functions and settings. Procedures and policies of the gym were outlined and explained. The patient's individual exercise prescription and treatment plan were reviewed with them. All starting workloads were established based on the results of the functional testing  done at the initial intake visit. The plan for exercise progression was also introduced and progression will be customized based on the patient's performance and goals. There has been a several month gap between this visit and Olinda's medical review. Due to this, her ex. Rx. Was reviewed and special care was taken during her exercise to insure the workloads were manageable. Made slight progression with exercise goals. Aziya only game twice  during this 30 day review. This inconsistent attendance makes progression difficult. We will follow up with her at her next visit on barriers to coming to class and what we can do to help.    Duration    Progress to 30 minutes of continuous aerobic without signs/symptoms of physical distress Progress to 30 minutes of continuous aerobic without signs/symptoms of physical distress   Intensity    Other (comment)  Rest + 20 Other (comment)  Rest + 20   Progression   Progression    Continue progressive  overload as per policy without signs/symptoms or physical distress. Continue progressive overload as per policy without signs/symptoms or physical distress.   Resistance Training   Training Prescription (read-only)    Yes Yes   Weight (read-only)    2 2   Reps (read-only)    10-15 10-15   Interval Training   Interval Training     No   NuStep   Level (read-only)    2 2   Watts (read-only)    35 35   Minutes (read-only)    20 20   Biostep-RELP   Level (read-only)     2   Watts (read-only)     25   Minutes (read-only)     15     10/08/15 1100 11/05/15 1200         Exercise Review   Progression No  Absent since 09/07/15 No  Absent since 09/07/15      Response to Exercise   Comments Absent since last review, workloads will be re-evaluated upon her return Absent since last review, workloads will be re-evaluated upon her return      Duration Progress to 30 minutes of continuous aerobic without signs/symptoms of physical distress Progress to 30 minutes of continuous aerobic without signs/symptoms of physical distress      Intensity Other (comment)  Rest + 20 Other (comment)  Rest + 20      Progression   Progression Continue progressive overload as per policy without signs/symptoms or physical distress. Continue progressive overload as per policy without signs/symptoms or physical distress.      Resistance Training   Training Prescription (read-only) Yes       Weight (read-only) 2       Reps  (read-only) 10-15       Interval Training   Interval Training No No      NuStep   Level (read-only) 2       Watts (read-only) 35       Minutes (read-only) 20       Biostep-RELP   Level (read-only) 2       Watts (read-only) 25       Minutes (read-only) 15          Exercise Comments:     Exercise Comments      12/12/15 0646           Exercise Comments The patient has not attended class since the last review. There has been no progress made due to lack of attendance. Workloads may need to be adjusted if the patient returns to class.           Discharge Exercise Prescription (Final Exercise Prescription Changes):     Exercise Prescription Changes - 11/05/15 1200    Exercise Review   Progression No  Absent since 09/07/15   Response to Exercise   Comments Absent since last review, workloads will be re-evaluated upon her return   Duration Progress to 30 minutes of continuous aerobic without signs/symptoms of physical distress   Intensity Other (comment)  Rest + 20   Progression   Progression Continue progressive overload as per policy without signs/symptoms or physical distress.   Interval Training   Interval Training No      Nutrition:  Target Goals: Understanding of nutrition guidelines, daily intake of sodium <1521m, cholesterol <2057m calories 30% from fat and 7% or less from saturated fats, daily to have 5 or more servings of fruits and vegetables.  Biometrics:    Nutrition Therapy  Plan and Nutrition Goals:   Nutrition Discharge: Rate Your Plate Scores:   Nutrition Goals Re-Evaluation:   Psychosocial: Target Goals: Acknowledge presence or absence of depression, maximize coping skills, provide positive support system. Participant is able to verbalize types and ability to use techniques and skills needed for reducing stress and depression.  Initial Review & Psychosocial Screening:   Quality of Life Scores:   PHQ-9:     Recent Review Flowsheet  Data    Depression screen [facility] 2/9 11/20/2015 05/14/2015 02/01/2015   Decreased Interest 0 0 0   Down, Depressed, Hopeless 0 3 0   PHQ - 2 Score 0 3 0   Altered sleeping - 3 -   Tired, decreased energy - 3 -   Change in appetite - 3 -   Feeling bad or failure about yourself  - 0 -   Trouble concentrating - 3 -   Moving slowly or fidgety/restless - 3 -   Suicidal thoughts - 0 -   PHQ-9 Score - 18 -   Difficult doing work/chores - Not difficult at all -      Psychosocial Evaluation and Intervention:     Psychosocial Evaluation - 09/05/15 0949    Psychosocial Evaluation & Interventions   Comments Counselor met with Ms. Mattice today for initial psychosocial evaluation.  She is a 50 year old female with multiple health issues, including chronic pain, a seizure disorder, an upcoming colonoscopy and possible hysterectomy on top of her recent cardiac arrest (7/31).  Ms. Alkhatib has two adult children living in her home who help care for her, as well as a father and significant other close by, so she has a strong support system.  She reports a long history of not sleeping well  and currently states she does not have much appetite.   She reports a history of depression and anxiety and is currently on medications for this that are currently not working well as her PHQ-9 scores were 18, which is concerning.  Ms. Elman has multiple stressors currently with health issues and financial strain due to not being able to work.  Also, parenting is stressful for her at this time.  Ms. Koslow is unable to drive for 6 months due to her seizure disorder.  She has goals for this program to lose weight and get healthier in general - hopefully get back to work soon.  She has home gym equipment that she will begin using once she is done with this program or even on the off days.  Counselor recommended that Ms. Bradt speak with her Dr. about her current anti-depressant medications not working, as well as possibly considering ordering a  sleep study due to her long history of not sleeping well.  She also mentioned addressing her chronic pain would be helpful, since this impacts her mood and ability to sleep.  Counselor also recommended Ms. Laser see the dietician for help addressing her weight loss goals.       Psychosocial Re-Evaluation:     Psychosocial Re-Evaluation      06/21/15 1415           Psychosocial Re-Evaluation   Interventions Encouraged to attend Cardiac Rehabilitation for the exercise       Comments Has been unable to attend Cardiac Rehab lately and I left her a vm.           Vocational Rehabilitation: Provide vocational rehab assistance to qualifying candidates.   Vocational Rehab Evaluation & Intervention:   Education:  Education Goals: Education classes will be provided on a weekly basis, covering required topics. Participant will state understanding/return demonstration of topics presented.  Learning Barriers/Preferences:   Education Topics: General Nutrition Guidelines/Fats and Fiber: -Group instruction provided by verbal, written material, models and posters to present the general guidelines for heart healthy nutrition. Gives an explanation and review of dietary fats and fiber.   Controlling Sodium/Reading Food Labels: -Group verbal and written material supporting the discussion of sodium use in heart healthy nutrition. Review and explanation with models, verbal and written materials for utilization of the food label.   Exercise Physiology & Risk Factors: - Group verbal and written instruction with models to review the exercise physiology of the cardiovascular system and associated critical values. Details cardiovascular disease risk factors and the goals associated with each risk factor.   Aerobic Exercise & Resistance Training: - Gives group verbal and written discussion on the health impact of inactivity. On the components of aerobic and resistive training programs and the benefits  of this training and how to safely progress through these programs.   Flexibility, Balance, General Exercise Guidelines: - Provides group verbal and written instruction on the benefits of flexibility and balance training programs. Provides general exercise guidelines with specific guidelines to those with heart or lung disease. Demonstration and skill practice provided.   Stress Management: - Provides group verbal and written instruction about the health risks of elevated stress, cause of high stress, and healthy ways to reduce stress.          Cardiac Rehab from 09/05/2015 in [facility] Cardiac and Pulmonary Rehab   Date  09/05/15   Educator  [facility]   Instruction Review Code  2- meets goals/outcomes      Depression: - Provides group verbal and written instruction on the correlation between heart/lung disease and depressed mood, treatment options, and the stigmas associated with seeking treatment.   Anatomy & Physiology of the Heart: - Group verbal and written instruction and models provide basic cardiac anatomy and physiology, with the coronary electrical and arterial systems. Review of: AMI, Angina, Valve disease, Heart Failure, Cardiac Arrhythmia, Pacemakers, and the ICD.   Cardiac Procedures: - Group verbal and written instruction and models to describe the testing methods done to diagnose heart disease. Reviews the outcomes of the test results. Describes the treatment choices: Medical Management, Angioplasty, or Coronary Bypass Surgery.   Cardiac Medications: - Group verbal and written instruction to review commonly prescribed medications for heart disease. Reviews the medication, class of the drug, and side effects. Includes the steps to properly store meds and maintain the prescription regimen.   Go Sex-Intimacy & Heart Disease, Get SMART - Goal Setting: - Group verbal and written instruction through game format to discuss heart disease and the return to sexual intimacy. Provides group  verbal and written material to discuss and apply goal setting through the application of the S.M.A.R.T. Method.   Other Matters of the Heart: - Provides group verbal, written materials and models to describe Heart Failure, Angina, Valve Disease, and Diabetes in the realm of heart disease. Includes description of the disease process and treatment options available to the cardiac patient.   Exercise & Equipment Safety: - Individual verbal instruction and demonstration of equipment use and safety with use of the equipment.      Cardiac Rehab from 09/05/2015 in [facility] Cardiac and Pulmonary Rehab   Date  05/14/15   Educator  Sb   Instruction Review Code  2- meets goals/outcomes  Infection Prevention: - Provides verbal and written material to individual with discussion of infection control including proper hand washing and proper equipment cleaning during exercise session.      Cardiac Rehab from 09/05/2015 in [facility] Cardiac and Pulmonary Rehab   Date  05/14/15   Educator  SB   Instruction Review Code  2- meets goals/outcomes      Falls Prevention: - Provides verbal and written material to individual with discussion of falls prevention and safety.      Cardiac Rehab from 09/05/2015 in [facility] Cardiac and Pulmonary Rehab   Date  05/14/15   Educator  SB   Instruction Review Code  2- meets goals/outcomes      Diabetes: - Individual verbal and written instruction to review signs/symptoms of diabetes, desired ranges of glucose level fasting, after meals and with exercise. Advice that pre and post exercise glucose checks will be done for 3 sessions at entry of program.    Knowledge Questionnaire Score:   Core Components/Risk Factors/Patient Goals at Admission:   Core Components/Risk Factors/Patient Goals Review:      Goals and Risk Factor Review      06/21/15 1414 09/11/15 0751 10/08/15 1106 11/09/15 1349     Core Components/Risk Factors/Patient Goals Review   Personal Goals Review     Sedentary    Review    I left Gregery Na a vm and asked her to let us know either way if she is plan on returning to Cardiac Rehab. Has not been here since Sep 07, 2015.    Expected Outcomes    To exercise in Cardiac Rehab.     Increase Aerobic Exercise and Physical Activity (read-only)   Goals Progress/Improvement seen  No Yes No     Comments Has been unable to attend Cardiac Rehab lately and I left her a vm.  Made slight progression with exercise goals. Sharmeka only game twice during this 30 day review. This inconsistent attendance makes progression difficult. We will follow up with her at her next visit on barriers to coming to class and what we can do to help.  Calley has been absent since 09/07/15. No progression will be made until her return. Ex. rx. will be re-evaluated when she returns based upon her physical capacity.         Core Components/Risk Factors/Patient Goals at Discharge (Final Review):      Goals and Risk Factor Review - 11/09/15 1349    Core Components/Risk Factors/Patient Goals Review   Personal Goals Review Sedentary   Review I left Avianna Moynahan a vm and asked her to let us know either way if she is plan on returning to Cardiac Rehab. Has not been here since Sep 07, 2015.   Expected Outcomes To exercise in Cardiac Rehab.       ITP Comments:     ITP Comments      07/30/15 1221 08/26/15 1237 09/20/15 1138 10/16/15 0813 11/15/15 1305   ITP Comments 30 day review preparation: Continue with ITP. Keilyn has been absent this month. Had procedure done to replace her ICD wire. Plans to return this week. Ready for 30 day review.  Continue with ITP  HAs not returned since orientation 05/14/2015. Planned to return 12/28, but called in sick. Ready for 30 day review. Continue with ITP. Leondra has attended 2 sessions since last review. 30 day review.  Continue with ITP. 4 sessions only, last visit 09/07/2015 30 Day Review. Continue with the ITP.  HAS been absent since  1/13. Has been called with no  response to [place] yet.      12/16/15 0933           ITP Comments 30 day review.  Continue with ITP  Remains absent          Comments:

## 2015-12-28 ENCOUNTER — Encounter: Payer: Medicaid Other | Admitting: Nurse Practitioner

## 2015-12-28 DIAGNOSIS — G5603 Carpal tunnel syndrome, bilateral upper limbs: Secondary | ICD-10-CM | POA: Insufficient documentation

## 2016-01-07 ENCOUNTER — Telehealth: Payer: Self-pay | Admitting: *Deleted

## 2016-01-07 NOTE — Telephone Encounter (Signed)
Called to check on status to return to program. LMOM 

## 2016-01-13 ENCOUNTER — Encounter: Payer: Self-pay | Admitting: *Deleted

## 2016-01-13 ENCOUNTER — Telehealth: Payer: Self-pay | Admitting: *Deleted

## 2016-01-13 DIAGNOSIS — I214 Non-ST elevation (NSTEMI) myocardial infarction: Secondary | ICD-10-CM

## 2016-01-13 NOTE — Telephone Encounter (Signed)
Called to check on status to return to program. lmom

## 2016-01-13 NOTE — Progress Notes (Signed)
Cardiac Individual Treatment Plan  Patient Details  Name: Claire Mcdonald MRN: [id number] Date of Birth: 1966-01-12 Referring Provider:    Initial Encounter Date:       Cardiac Rehab from 05/14/2015 in [facility] Cardiac and Pulmonary Rehab   Date  05/14/15      Visit Diagnosis: Acute non-ST-elevation myocardial infarction [facility])  Patient's Home Medications on Admission:  Current outpatient prescriptions:  .  albuterol (PROVENTIL HFA;VENTOLIN HFA) 108 (90 BASE) MCG/ACT inhaler, Inhale 2 puffs into the lungs every 6 (six) hours as needed for wheezing or shortness of breath., Disp: 1 Inhaler, Rfl: 0 .  apixaban (ELIQUIS) 5 MG TABS tablet, Take 1 tablet (5 mg total) by mouth 2 (two) times daily., Disp: 60 tablet, Rfl: 12 .  atorvastatin (LIPITOR) 20 MG tablet, Take 1 tablet (20 mg total) by mouth daily at 6 PM., Disp: 90 tablet, Rfl: 1 .  cyclobenzaprine (FLEXERIL) 10 MG tablet, Take 10 mg by mouth 3 (three) times daily as needed for muscle spasms., Disp: , Rfl:  .  dicyclomine (BENTYL) 20 MG tablet, Take 1 tablet (20 mg total) by mouth 3 (three) times daily as needed for spasms., Disp: 30 tablet, Rfl: 0 .  DULoxetine (CYMBALTA) 20 MG capsule, Take 20 mg by mouth daily. Reported on 11/20/2015, Disp: , Rfl:  .  DULoxetine (CYMBALTA) 60 MG capsule, Take 60 mg by mouth daily. Reported on 11/20/2015, Disp: , Rfl:  .  furosemide (LASIX) 20 MG tablet, Take 20 mg by mouth daily and may take an additional daily as directed, Disp: 90 tablet, Rfl: 3 .  hyoscyamine (LEVSIN SL) 0.125 MG SL tablet, Place 0.125 mg under the tongue every 4 (four) hours as needed. Reported on 11/20/2015, Disp: , Rfl:  .  levETIRAcetam (KEPPRA) 500 MG tablet, Take 1 tablet (500 mg total) by mouth 2 (two) times daily., Disp: 60 tablet, Rfl: 2 .  metoprolol tartrate (LOPRESSOR) 25 MG tablet, Take 0.5 tablets (12.5 mg total) by mouth 2 (two) times daily., Disp: 60 tablet, Rfl: 1 .  omeprazole (PRILOSEC) 20 MG capsule, Take 20 mg by mouth  daily., Disp: , Rfl:  .  promethazine (PHENERGAN) 12.5 MG suppository, Place 1 suppository (12.5 mg total) rectally every 6 (six) hours as needed for nausea or vomiting. (Patient not taking: Reported on 11/20/2015), Disp: 12 each, Rfl: 0 .  sertraline (ZOLOFT) 100 MG tablet, Take 100 mg by mouth daily., Disp: , Rfl:  .  topiramate (TOPAMAX) 100 MG tablet, Take 1 tablet (100 mg total) by mouth 2 (two) times daily., Disp: 60 tablet, Rfl: 1 .  traZODone (DESYREL) 50 MG tablet, Take 50 mg by mouth at bedtime as needed for sleep., Disp: , Rfl:   Past Medical History: Past Medical History  Diagnosis Date  . Hypertension   . High cholesterol   . Anxiety   . Depression   . Allergy   . GERD (gastroesophageal reflux disease)   . Migraine   . Diverticulosis     seen on CT 2015  . Obesity (BMI 30-39.9)   . DVT (deep venous thrombosis) ([place])   . Idiopathic cardiac arrest ([place]) 02/2015    V. fib, EF nl, non-obs CAD; [employer] ICD  . Coronary artery disease   . DDD (degenerative disc disease), cervical   . Heart failure ([place])   . Hyperlipidemia   . Seizures ([place])   . Ventricular tachycardia ([facility])     Tobacco Use: History  Smoking status  . Never Smoker  Smokeless tobacco  . Never Used    Labs: Recent Review Flowsheet Data    Labs for ITP Cardiac and Pulmonary Rehab Latest Ref Rng 03/25/2015 03/25/2015 03/26/2015 03/28/2015 04/16/2015   Cholestrol <200 mg/dL - - - - 539   Trlycerides <150 mg/dL 592 - - 93 -   PHART 0.646 - 7.450 - 7.356 7.346(L) - -   PCO2ART 35.0 - 45.0 mmHg - 35.7 34.8(L) - -   HCO3 20.0 - 24.0 mEq/L - 19.3(L) 19.1(L) - -   TCO2 0 - 100 mmol/L - 20.4 20 - -   ACIDBASEDEF 0.0 - 2.0 mmol/L - 5.1(H) 6.0(H) - -   O2SAT - - 99.3 99.0 - -       Exercise Target Goals:    Exercise Program Goal: Individual exercise prescription set with THRR, safety & activity barriers. Participant demonstrates ability to understand and report RPE using BORG scale, to self-measure pulse  accurately, and to acknowledge the importance of the exercise prescription.  Exercise Prescription Goal: Starting with aerobic activity 30 plus minutes a day, 3 days per week for initial exercise prescription. Provide home exercise prescription and guidelines that participant acknowledges understanding prior to discharge.  Activity Barriers & Risk Stratification:   6 Minute Walk:   Initial Exercise Prescription:   Perform Capillary Blood Glucose checks as needed.  Exercise Prescription Changes:     Exercise Prescription Changes      07/23/15 1400 08/21/15 0700 09/05/15 1100 09/07/15 0746 10/08/15 1100   Exercise Review   Progression No  Has not started exercise; medical review on 05/14/15 No  Has not started exercise; medical review on 05/14/15  Yes No  Absent since 09/07/15   Response to Exercise   Blood Pressure (Admit)    140/80 mmHg    Blood Pressure (Exercise)    144/88 mmHg    Blood Pressure (Exit)    148/88 mmHg    Heart Rate (Admit)    113 bpm    Heart Rate (Exercise)    123 bpm    Heart Rate (Exit)    96 bpm    Rating of Perceived Exertion (Exercise)    9    Symptoms   None None    Comments   First day of exercise! Patient was oriented to the gym and the equipment functions and settings. Procedures and policies of the gym were outlined and explained. The patient's individual exercise prescription and treatment plan were reviewed with them. All starting workloads were established based on the results of the functional testing  done at the initial intake visit. The plan for exercise progression was also introduced and progression will be customized based on the patient's performance and goals. There has been a several month gap between this visit and Claire Mcdonald's medical review. Due to this, her ex. Rx. Was reviewed and special care was taken during her exercise to insure the workloads were manageable. Made slight progression with exercise goals. Claire Mcdonald only game twice during this 30  day review. This inconsistent attendance makes progression difficult. We will follow up with her at her next visit on barriers to coming to class and what we can do to help.  Absent since last review, workloads will be re-evaluated upon her return   Duration   Progress to 30 minutes of continuous aerobic without signs/symptoms of physical distress Progress to 30 minutes of continuous aerobic without signs/symptoms of physical distress Progress to 30 minutes of continuous aerobic without signs/symptoms of physical distress   Intensity  Other (comment)  Rest + 20 Other (comment)  Rest + 20 Other (comment)  Rest + 20   Progression   Progression   Continue progressive overload as per policy without signs/symptoms or physical distress. Continue progressive overload as per policy without signs/symptoms or physical distress. Continue progressive overload as per policy without signs/symptoms or physical distress.   Resistance Training   Training Prescription (read-only)   Yes Yes Yes   Weight (read-only)   '2 2 2   '$ Reps (read-only)   10-15 10-15 10-15   Interval Training   Interval Training    No No   NuStep   Level (read-only)   '2 2 2   '$ Watts (read-only)   35 35 35   Minutes (read-only)   '20 20 20   '$ Biostep-RELP   Level (read-only)    2 2   Watts (read-only)    25 25   Minutes (read-only)    15 15     11/05/15 1200           Exercise Review   Progression No  Absent since 09/07/15       Response to Exercise   Comments Absent since last review, workloads will be re-evaluated upon her return       Duration Progress to 30 minutes of continuous aerobic without signs/symptoms of physical distress       Intensity Other (comment)  Rest + 20       Progression   Progression Continue progressive overload as per policy without signs/symptoms or physical distress.       Interval Training   Interval Training No          Exercise Comments:     Exercise Comments      12/12/15 0646 01/09/16 0650          Exercise Comments The patient has not attended class since the last review. There has been no progress made due to lack of attendance. Workloads may need to be adjusted if the patient returns to class.  The patient has not attended class since the last review. There has been no progress made due to lack of attendance. Workloads may need to be adjusted if the patient returns to class.          Discharge Exercise Prescription (Final Exercise Prescription Changes):     Exercise Prescription Changes - 11/05/15 1200    Exercise Review   Progression No  Absent since 09/07/15   Response to Exercise   Comments Absent since last review, workloads will be re-evaluated upon her return   Duration Progress to 30 minutes of continuous aerobic without signs/symptoms of physical distress   Intensity Other (comment)  Rest + 20   Progression   Progression Continue progressive overload as per policy without signs/symptoms or physical distress.   Interval Training   Interval Training No      Nutrition:  Target Goals: Understanding of nutrition guidelines, daily intake of sodium '1500mg'$ , cholesterol '200mg'$ , calories 30% from fat and 7% or less from saturated fats, daily to have 5 or more servings of fruits and vegetables.  Biometrics:    Nutrition Therapy Plan and Nutrition Goals:   Nutrition Discharge: Rate Your Plate Scores:   Nutrition Goals Re-Evaluation:   Psychosocial: Target Goals: Acknowledge presence or absence of depression, maximize coping skills, provide positive support system. Participant is able to verbalize types and ability to use techniques and skills needed for reducing stress and depression.  Initial Review & Psychosocial Screening:  Quality of Life Scores:   PHQ-9:     Recent Review Flowsheet Data    Depression screen [facility] 2/9 11/20/2015 05/14/2015 02/01/2015   Decreased Interest 0 0 0   Down, Depressed, Hopeless 0 3 0   PHQ - 2 Score 0 3 0   Altered  sleeping - 3 -   Tired, decreased energy - 3 -   Change in appetite - 3 -   Feeling bad or failure about yourself  - 0 -   Trouble concentrating - 3 -   Moving slowly or fidgety/restless - 3 -   Suicidal thoughts - 0 -   PHQ-9 Score - 18 -   Difficult doing work/chores - Not difficult at all -      Psychosocial Evaluation and Intervention:     Psychosocial Evaluation - 09/05/15 0949    Psychosocial Evaluation & Interventions   Comments Counselor met with Ms. Sproull today for initial psychosocial evaluation.  She is a 50 year old female with multiple health issues, including chronic pain, a seizure disorder, an upcoming colonoscopy and possible hysterectomy on top of her recent cardiac arrest (7/31).  Ms. Kimmer has two adult children living in her home who help care for her, as well as a father and significant other close by, so she has a strong support system.  She reports a long history of not sleeping well  and currently states she does not have much appetite.   She reports a history of depression and anxiety and is currently on medications for this that are currently not working well as her PHQ-9 scores were 18, which is concerning.  Ms. Espiritu has multiple stressors currently with health issues and financial strain due to not being able to work.  Also, parenting is stressful for her at this time.  Ms. Trevizo is unable to drive for 6 months due to her seizure disorder.  She has goals for this program to lose weight and get healthier in general - hopefully get back to work soon.  She has home gym equipment that she will begin using once she is done with this program or even on the off days.  Counselor recommended that Ms. Urschel speak with her Dr. about her current anti-depressant medications not working, as well as possibly considering ordering a sleep study due to her long history of not sleeping well.  She also mentioned addressing her chronic pain would be helpful, since this impacts her mood and  ability to sleep.  Counselor also recommended Ms. Soman see the dietician for help addressing her weight loss goals.       Psychosocial Re-Evaluation:   Vocational Rehabilitation: Provide vocational rehab assistance to qualifying candidates.   Vocational Rehab Evaluation & Intervention:   Education: Education Goals: Education classes will be provided on a weekly basis, covering required topics. Participant will state understanding/return demonstration of topics presented.  Learning Barriers/Preferences:   Education Topics: General Nutrition Guidelines/Fats and Fiber: -Group instruction provided by verbal, written material, models and posters to present the general guidelines for heart healthy nutrition. Gives an explanation and review of dietary fats and fiber.   Controlling Sodium/Reading Food Labels: -Group verbal and written material supporting the discussion of sodium use in heart healthy nutrition. Review and explanation with models, verbal and written materials for utilization of the food label.   Exercise Physiology & Risk Factors: - Group verbal and written instruction with models to review the exercise physiology of the cardiovascular system and associated critical values.  Details cardiovascular disease risk factors and the goals associated with each risk factor.   Aerobic Exercise & Resistance Training: - Gives group verbal and written discussion on the health impact of inactivity. On the components of aerobic and resistive training programs and the benefits of this training and how to safely progress through these programs.   Flexibility, Balance, General Exercise Guidelines: - Provides group verbal and written instruction on the benefits of flexibility and balance training programs. Provides general exercise guidelines with specific guidelines to those with heart or lung disease. Demonstration and skill practice provided.   Stress Management: - Provides group  verbal and written instruction about the health risks of elevated stress, cause of high stress, and healthy ways to reduce stress.          Cardiac Rehab from 09/05/2015 in [facility] Cardiac and Pulmonary Rehab   Date  09/05/15   Educator  [facility]   Instruction Review Code  2- meets goals/outcomes      Depression: - Provides group verbal and written instruction on the correlation between heart/lung disease and depressed mood, treatment options, and the stigmas associated with seeking treatment.   Anatomy & Physiology of the Heart: - Group verbal and written instruction and models provide basic cardiac anatomy and physiology, with the coronary electrical and arterial systems. Review of: AMI, Angina, Valve disease, Heart Failure, Cardiac Arrhythmia, Pacemakers, and the ICD.   Cardiac Procedures: - Group verbal and written instruction and models to describe the testing methods done to diagnose heart disease. Reviews the outcomes of the test results. Describes the treatment choices: Medical Management, Angioplasty, or Coronary Bypass Surgery.   Cardiac Medications: - Group verbal and written instruction to review commonly prescribed medications for heart disease. Reviews the medication, class of the drug, and side effects. Includes the steps to properly store meds and maintain the prescription regimen.   Go Sex-Intimacy & Heart Disease, Get SMART - Goal Setting: - Group verbal and written instruction through game format to discuss heart disease and the return to sexual intimacy. Provides group verbal and written material to discuss and apply goal setting through the application of the S.M.A.R.T. Method.   Other Matters of the Heart: - Provides group verbal, written materials and models to describe Heart Failure, Angina, Valve Disease, and Diabetes in the realm of heart disease. Includes description of the disease process and treatment options available to the cardiac patient.   Exercise &  Equipment Safety: - Individual verbal instruction and demonstration of equipment use and safety with use of the equipment.      Cardiac Rehab from 09/05/2015 in [facility] Cardiac and Pulmonary Rehab   Date  05/14/15   Educator  Sb   Instruction Review Code  2- meets goals/outcomes      Infection Prevention: - Provides verbal and written material to individual with discussion of infection control including proper hand washing and proper equipment cleaning during exercise session.      Cardiac Rehab from 09/05/2015 in [facility] Cardiac and Pulmonary Rehab   Date  05/14/15   Educator  SB   Instruction Review Code  2- meets goals/outcomes      Falls Prevention: - Provides verbal and written material to individual with discussion of falls prevention and safety.      Cardiac Rehab from 09/05/2015 in [facility] Cardiac and Pulmonary Rehab   Date  05/14/15   Educator  SB   Instruction Review Code  2- meets goals/outcomes      Diabetes: - Individual verbal  and written instruction to review signs/symptoms of diabetes, desired ranges of glucose level fasting, after meals and with exercise. Advice that pre and post exercise glucose checks will be done for 3 sessions at entry of program.    Knowledge Questionnaire Score:   Core Components/Risk Factors/Patient Goals at Admission:   Core Components/Risk Factors/Patient Goals Review:      Goals and Risk Factor Review      09/11/15 0751 10/08/15 1106 11/09/15 1349       Core Components/Risk Factors/Patient Goals Review   Personal Goals Review   Sedentary     Review   I left Gregery Na a vm and asked her to let us know either way if she is plan on returning to Cardiac Rehab. Has not been here since Sep 07, 2015.     Expected Outcomes   To exercise in Cardiac Rehab.      Increase Aerobic Exercise and Physical Activity (read-only)   Goals Progress/Improvement seen  Yes No      Comments Made slight progression with exercise goals. Carlette only game twice  during this 30 day review. This inconsistent attendance makes progression difficult. We will follow up with her at her next visit on barriers to coming to class and what we can do to help.  Martesha has been absent since 09/07/15. No progression will be made until her return. Ex. rx. will be re-evaluated when she returns based upon her physical capacity.          Core Components/Risk Factors/Patient Goals at Discharge (Final Review):      Goals and Risk Factor Review - 11/09/15 1349    Core Components/Risk Factors/Patient Goals Review   Personal Goals Review Sedentary   Review I left Colena Ketterman a vm and asked her to let us know either way if she is plan on returning to Cardiac Rehab. Has not been here since Sep 07, 2015.   Expected Outcomes To exercise in Cardiac Rehab.       ITP Comments:     ITP Comments      07/30/15 1221 08/26/15 1237 09/20/15 1138 10/16/15 0813 11/15/15 1305   ITP Comments 30 day review preparation: Continue with ITP. Jolina has been absent this month. Had procedure done to replace her ICD wire. Plans to return this week. Ready for 30 day review.  Continue with ITP  HAs not returned since orientation 05/14/2015. Planned to return 12/28, but called in sick. Ready for 30 day review. Continue with ITP. Lavene has attended 2 sessions since last review. 30 day review.  Continue with ITP. 4 sessions only, last visit 09/07/2015 30 Day Review. Continue with the ITP.  HAS been absent since 1/13. Has been called with no response to [place] yet.      12/16/15 0933 01/13/16 1243         ITP Comments 30 day review.  Continue with ITP  Remains absent 30 day review. Continue with ITP Remians absent , multiple calls  to [place] with messgages left on voicemail.          Comments:

## 2016-02-06 ENCOUNTER — Encounter: Payer: Self-pay | Admitting: *Deleted

## 2016-02-06 DIAGNOSIS — I214 Non-ST elevation (NSTEMI) myocardial infarction: Secondary | ICD-10-CM

## 2016-02-12 ENCOUNTER — Encounter: Payer: Self-pay | Admitting: *Deleted

## 2016-02-12 ENCOUNTER — Telehealth: Payer: Self-pay | Admitting: *Deleted

## 2016-02-12 DIAGNOSIS — I214 Non-ST elevation (NSTEMI) myocardial infarction: Secondary | ICD-10-CM

## 2016-02-12 NOTE — Telephone Encounter (Signed)
Called to check on status to return to program.   Discharged  No show since Jan   Multiple calls

## 2016-02-12 NOTE — Progress Notes (Signed)
Discharge Summary  Patient Details  Name: ALVERNA STUEWE MRN: [id number] Date of Birth: 10/26/1965 Referring Provider:     Number of Visits: 3   Reason for Discharge:  Out since Jan,  Not returning calls  Smoking History:  History  Smoking status  . Never Smoker   Smokeless tobacco  . Never Used    Diagnosis:  Acute non-ST-elevation myocardial infarction [facility])  ADL UCSD:   Initial Exercise Prescription:   Discharge Exercise Prescription (Final Exercise Prescription Changes):     Exercise Prescription Changes - 11/05/15 1200    Exercise Review   Progression No  Absent since 09/07/15   Response to Exercise   Comments Absent since last review, workloads will be re-evaluated upon her return   Duration Progress to 30 minutes of continuous aerobic without signs/symptoms of physical distress   Intensity Other (comment)  Rest + 20   Progression   Progression Continue progressive overload as per policy without signs/symptoms or physical distress.   Interval Training   Interval Training No      Functional Capacity:   Psychological, QOL, Others - Outcomes: PHQ 2/9: Depression screen [facility] 2/9 11/20/2015 05/14/2015 02/01/2015  Decreased Interest 0 0 0  Down, Depressed, Hopeless 0 3 0  PHQ - 2 Score 0 3 0  Altered sleeping - 3 -  Tired, decreased energy - 3 -  Change in appetite - 3 -  Feeling bad or failure about yourself  - 0 -  Trouble concentrating - 3 -  Moving slowly or fidgety/restless - 3 -  Suicidal thoughts - 0 -  PHQ-9 Score - 18 -  Difficult doing work/chores - Not difficult at all -    Quality of Life:   Personal Goals: Goals established at orientation with interventions provided to work toward goal.    Personal Goals Discharge:     Goals and Risk Factor Review      09/11/15 0751 10/08/15 1106 11/09/15 1349       Core Components/Risk Factors/Patient Goals Review   Personal Goals Review   Sedentary     Review   I left Traci Peers a vm and asked  her to let us know either way if she is plan on returning to Cardiac Rehab. Has not been here since Sep 07, 2015.     Expected Outcomes   To exercise in Cardiac Rehab.      Increase Aerobic Exercise and Physical Activity (read-only)   Goals Progress/Improvement seen  Yes No      Comments Made slight progression with exercise goals. Charmelle only game twice during this 30 day review. This inconsistent attendance makes progression difficult. We will follow up with her at her next visit on barriers to coming to class and what we can do to help.  Shagun has been absent since 09/07/15. No progression will be made until her return. Ex. rx. will be re-evaluated when she returns based upon her physical capacity.          Nutrition & Weight - Outcomes:    Nutrition:   Nutrition Discharge:   Education Questionnaire Score:   Goals reviewed with patient; copy given to patient.

## 2016-02-12 NOTE — Progress Notes (Signed)
Cardiac Individual Treatment Plan  Patient Details  Name: Claire Mcdonald MRN: [id number] Date of Birth: 1966-01-12 Referring Provider:    Initial Encounter Date:       Cardiac Rehab from 05/14/2015 in [facility] Cardiac and Pulmonary Rehab   Date  05/14/15      Visit Diagnosis: Acute non-ST-elevation myocardial infarction [facility])  Patient's Home Medications on Admission:  Current outpatient prescriptions:  .  albuterol (PROVENTIL HFA;VENTOLIN HFA) 108 (90 BASE) MCG/ACT inhaler, Inhale 2 puffs into the lungs every 6 (six) hours as needed for wheezing or shortness of breath., Disp: 1 Inhaler, Rfl: 0 .  apixaban (ELIQUIS) 5 MG TABS tablet, Take 1 tablet (5 mg total) by mouth 2 (two) times daily., Disp: 60 tablet, Rfl: 12 .  atorvastatin (LIPITOR) 20 MG tablet, Take 1 tablet (20 mg total) by mouth daily at 6 PM., Disp: 90 tablet, Rfl: 1 .  cyclobenzaprine (FLEXERIL) 10 MG tablet, Take 10 mg by mouth 3 (three) times daily as needed for muscle spasms., Disp: , Rfl:  .  dicyclomine (BENTYL) 20 MG tablet, Take 1 tablet (20 mg total) by mouth 3 (three) times daily as needed for spasms., Disp: 30 tablet, Rfl: 0 .  DULoxetine (CYMBALTA) 20 MG capsule, Take 20 mg by mouth daily. Reported on 11/20/2015, Disp: , Rfl:  .  DULoxetine (CYMBALTA) 60 MG capsule, Take 60 mg by mouth daily. Reported on 11/20/2015, Disp: , Rfl:  .  furosemide (LASIX) 20 MG tablet, Take 20 mg by mouth daily and may take an additional daily as directed, Disp: 90 tablet, Rfl: 3 .  hyoscyamine (LEVSIN SL) 0.125 MG SL tablet, Place 0.125 mg under the tongue every 4 (four) hours as needed. Reported on 11/20/2015, Disp: , Rfl:  .  levETIRAcetam (KEPPRA) 500 MG tablet, Take 1 tablet (500 mg total) by mouth 2 (two) times daily., Disp: 60 tablet, Rfl: 2 .  metoprolol tartrate (LOPRESSOR) 25 MG tablet, Take 0.5 tablets (12.5 mg total) by mouth 2 (two) times daily., Disp: 60 tablet, Rfl: 1 .  omeprazole (PRILOSEC) 20 MG capsule, Take 20 mg by mouth  daily., Disp: , Rfl:  .  promethazine (PHENERGAN) 12.5 MG suppository, Place 1 suppository (12.5 mg total) rectally every 6 (six) hours as needed for nausea or vomiting. (Patient not taking: Reported on 11/20/2015), Disp: 12 each, Rfl: 0 .  sertraline (ZOLOFT) 100 MG tablet, Take 100 mg by mouth daily., Disp: , Rfl:  .  topiramate (TOPAMAX) 100 MG tablet, Take 1 tablet (100 mg total) by mouth 2 (two) times daily., Disp: 60 tablet, Rfl: 1 .  traZODone (DESYREL) 50 MG tablet, Take 50 mg by mouth at bedtime as needed for sleep., Disp: , Rfl:   Past Medical History: Past Medical History  Diagnosis Date  . Hypertension   . High cholesterol   . Anxiety   . Depression   . Allergy   . GERD (gastroesophageal reflux disease)   . Migraine   . Diverticulosis     seen on CT 2015  . Obesity (BMI 30-39.9)   . DVT (deep venous thrombosis) ([place])   . Idiopathic cardiac arrest ([place]) 02/2015    V. fib, EF nl, non-obs CAD; [employer] ICD  . Coronary artery disease   . DDD (degenerative disc disease), cervical   . Heart failure ([place])   . Hyperlipidemia   . Seizures ([place])   . Ventricular tachycardia ([facility])     Tobacco Use: History  Smoking status  . Never Smoker  Smokeless tobacco  . Never Used    Labs: Recent Review Flowsheet Data    Labs for ITP Cardiac and Pulmonary Rehab Latest Ref Rng 03/25/2015 03/25/2015 03/26/2015 03/28/2015 04/16/2015   Cholestrol <200 mg/dL - - - - 908   Trlycerides <150 mg/dL 591 - - 93 -   PHART 1.770 - 7.450 - 7.356 7.346(L) - -   PCO2ART 35.0 - 45.0 mmHg - 35.7 34.8(L) - -   HCO3 20.0 - 24.0 mEq/L - 19.3(L) 19.1(L) - -   TCO2 0 - 100 mmol/L - 20.4 20 - -   ACIDBASEDEF 0.0 - 2.0 mmol/L - 5.1(H) 6.0(H) - -   O2SAT - - 99.3 99.0 - -       Exercise Target Goals:    Exercise Program Goal: Individual exercise prescription set with THRR, safety & activity barriers. Participant demonstrates ability to understand and report RPE using BORG scale, to self-measure pulse  accurately, and to acknowledge the importance of the exercise prescription.  Exercise Prescription Goal: Starting with aerobic activity 30 plus minutes a day, 3 days per week for initial exercise prescription. Provide home exercise prescription and guidelines that participant acknowledges understanding prior to discharge.  Activity Barriers & Risk Stratification:   6 Minute Walk:   Initial Exercise Prescription:   Perform Capillary Blood Glucose checks as needed.  Exercise Prescription Changes:     Exercise Prescription Changes      08/21/15 0700 09/05/15 1100 09/07/15 0746 10/08/15 1100 11/05/15 1200   Exercise Review   Progression No  Has not started exercise; medical review on 05/14/15  Yes No  Absent since 09/07/15 No  Absent since 09/07/15   Response to Exercise   Blood Pressure (Admit)   140/80 mmHg     Blood Pressure (Exercise)   144/88 mmHg     Blood Pressure (Exit)   148/88 mmHg     Heart Rate (Admit)   113 bpm     Heart Rate (Exercise)   123 bpm     Heart Rate (Exit)   96 bpm     Rating of Perceived Exertion (Exercise)   9     Symptoms  None None     Comments  First day of exercise! Patient was oriented to the gym and the equipment functions and settings. Procedures and policies of the gym were outlined and explained. The patient's individual exercise prescription and treatment plan were reviewed with them. All starting workloads were established based on the results of the functional testing  done at the initial intake visit. The plan for exercise progression was also introduced and progression will be customized based on the patient's performance and goals. There has been a several month gap between this visit and Claire Mcdonald's medical review. Due to this, her ex. Rx. Was reviewed and special care was taken during her exercise to insure the workloads were manageable. Made slight progression with exercise goals. Claire Mcdonald only game twice during this 30 day review. This inconsistent  attendance makes progression difficult. We will follow up with her at her next visit on barriers to coming to class and what we can do to help.  Absent since last review, workloads will be re-evaluated upon her return Absent since last review, workloads will be re-evaluated upon her return   Duration  Progress to 30 minutes of continuous aerobic without signs/symptoms of physical distress Progress to 30 minutes of continuous aerobic without signs/symptoms of physical distress Progress to 30 minutes of continuous aerobic without signs/symptoms of physical  distress Progress to 30 minutes of continuous aerobic without signs/symptoms of physical distress   Intensity  Other (comment)  Rest + 20 Other (comment)  Rest + 20 Other (comment)  Rest + 20 Other (comment)  Rest + 20   Progression   Progression  Continue progressive overload as per policy without signs/symptoms or physical distress. Continue progressive overload as per policy without signs/symptoms or physical distress. Continue progressive overload as per policy without signs/symptoms or physical distress. Continue progressive overload as per policy without signs/symptoms or physical distress.   Resistance Training   Training Prescription (read-only)  Yes Yes Yes    Weight (read-only)  '2 2 2    '$ Reps (read-only)  10-15 10-15 10-15    Interval Training   Interval Training   No No No   NuStep   Level (read-only)  '2 2 2    '$ Watts (read-only)  35 35 35    Minutes (read-only)  '20 20 20    '$ Biostep-RELP   Level (read-only)   2 2    Watts (read-only)   25 25    Minutes (read-only)   15 15       Exercise Comments:     Exercise Comments      12/12/15 0646 01/09/16 0650 02/06/16 1514       Exercise Comments The patient has not attended class since the last review. There has been no progress made due to lack of attendance. Workloads may need to be adjusted if the patient returns to class.  The patient has not attended class since the last  review. There has been no progress made due to lack of attendance. Workloads may need to be adjusted if the patient returns to class.  Pt has been out since last medical review.        Discharge Exercise Prescription (Final Exercise Prescription Changes):     Exercise Prescription Changes - 11/05/15 1200    Exercise Review   Progression No  Absent since 09/07/15   Response to Exercise   Comments Absent since last review, workloads will be re-evaluated upon her return   Duration Progress to 30 minutes of continuous aerobic without signs/symptoms of physical distress   Intensity Other (comment)  Rest + 20   Progression   Progression Continue progressive overload as per policy without signs/symptoms or physical distress.   Interval Training   Interval Training No      Nutrition:  Target Goals: Understanding of nutrition guidelines, daily intake of sodium '1500mg'$ , cholesterol '200mg'$ , calories 30% from fat and 7% or less from saturated fats, daily to have 5 or more servings of fruits and vegetables.  Biometrics:    Nutrition Therapy Plan and Nutrition Goals:   Nutrition Discharge: Rate Your Plate Scores:   Nutrition Goals Re-Evaluation:   Psychosocial: Target Goals: Acknowledge presence or absence of depression, maximize coping skills, provide positive support system. Participant is able to verbalize types and ability to use techniques and skills needed for reducing stress and depression.  Initial Review & Psychosocial Screening:   Quality of Life Scores:   PHQ-9:     Recent Review Flowsheet Data    Depression screen [facility] 2/9 11/20/2015 05/14/2015 02/01/2015   Decreased Interest 0 0 0   Down, Depressed, Hopeless 0 3 0   PHQ - 2 Score 0 3 0   Altered sleeping - 3 -   Tired, decreased energy - 3 -   Change in appetite - 3 -   Feeling bad or  failure about yourself  - 0 -   Trouble concentrating - 3 -   Moving slowly or fidgety/restless - 3 -   Suicidal thoughts - 0 -    PHQ-9 Score - 18 -   Difficult doing work/chores - Not difficult at all -      Psychosocial Evaluation and Intervention:     Psychosocial Evaluation - 09/05/15 0949    Psychosocial Evaluation & Interventions   Comments Counselor met with Claire Mcdonald today for initial psychosocial evaluation.  She is a 50 year old female with multiple health issues, including chronic pain, a seizure disorder, an upcoming colonoscopy and possible hysterectomy on top of her recent cardiac arrest (7/31).  Claire Mcdonald has two adult children living in her home who help care for her, as well as a father and significant other close by, so she has a strong support system.  She reports a long history of not sleeping well  and currently states she does not have much appetite.   She reports a history of depression and anxiety and is currently on medications for this that are currently not working well as her PHQ-9 scores were 18, which is concerning.  Claire Mcdonald has multiple stressors currently with health issues and financial strain due to not being able to work.  Also, parenting is stressful for her at this time.  Claire Mcdonald is unable to drive for 6 months due to her seizure disorder.  She has goals for this program to lose weight and get healthier in general - hopefully get back to work soon.  She has home gym equipment that she will begin using once she is done with this program or even on the off days.  Counselor recommended that Claire Mcdonald speak with her Dr. about her current anti-depressant medications not working, as well as possibly considering ordering a sleep study due to her long history of not sleeping well.  She also mentioned addressing her chronic pain would be helpful, since this impacts her mood and ability to sleep.  Counselor also recommended Claire Mcdonald see the dietician for help addressing her weight loss goals.       Psychosocial Re-Evaluation:   Vocational Rehabilitation: Provide vocational rehab assistance to  qualifying candidates.   Vocational Rehab Evaluation & Intervention:   Education: Education Goals: Education classes will be provided on a weekly basis, covering required topics. Participant will state understanding/return demonstration of topics presented.  Learning Barriers/Preferences:   Education Topics: General Nutrition Guidelines/Fats and Fiber: -Group instruction provided by verbal, written material, models and posters to present the general guidelines for heart healthy nutrition. Gives an explanation and review of dietary fats and fiber.   Controlling Sodium/Reading Food Labels: -Group verbal and written material supporting the discussion of sodium use in heart healthy nutrition. Review and explanation with models, verbal and written materials for utilization of the food label.   Exercise Physiology & Risk Factors: - Group verbal and written instruction with models to review the exercise physiology of the cardiovascular system and associated critical values. Details cardiovascular disease risk factors and the goals associated with each risk factor.   Aerobic Exercise & Resistance Training: - Gives group verbal and written discussion on the health impact of inactivity. On the components of aerobic and resistive training programs and the benefits of this training and how to safely progress through these programs.   Flexibility, Balance, General Exercise Guidelines: - Provides group verbal and written instruction on the benefits of flexibility and balance  training programs. Provides general exercise guidelines with specific guidelines to those with heart or lung disease. Demonstration and skill practice provided.   Stress Management: - Provides group verbal and written instruction about the health risks of elevated stress, cause of high stress, and healthy ways to reduce stress.          Cardiac Rehab from 09/05/2015 in [facility] Cardiac and Pulmonary Rehab   Date  09/05/15    Educator  [facility]   Instruction Review Code  2- meets goals/outcomes      Depression: - Provides group verbal and written instruction on the correlation between heart/lung disease and depressed mood, treatment options, and the stigmas associated with seeking treatment.   Anatomy & Physiology of the Heart: - Group verbal and written instruction and models provide basic cardiac anatomy and physiology, with the coronary electrical and arterial systems. Review of: AMI, Angina, Valve disease, Heart Failure, Cardiac Arrhythmia, Pacemakers, and the ICD.   Cardiac Procedures: - Group verbal and written instruction and models to describe the testing methods done to diagnose heart disease. Reviews the outcomes of the test results. Describes the treatment choices: Medical Management, Angioplasty, or Coronary Bypass Surgery.   Cardiac Medications: - Group verbal and written instruction to review commonly prescribed medications for heart disease. Reviews the medication, class of the drug, and side effects. Includes the steps to properly store meds and maintain the prescription regimen.   Go Sex-Intimacy & Heart Disease, Get SMART - Goal Setting: - Group verbal and written instruction through game format to discuss heart disease and the return to sexual intimacy. Provides group verbal and written material to discuss and apply goal setting through the application of the S.M.A.R.T. Method.   Other Matters of the Heart: - Provides group verbal, written materials and models to describe Heart Failure, Angina, Valve Disease, and Diabetes in the realm of heart disease. Includes description of the disease process and treatment options available to the cardiac patient.   Exercise & Equipment Safety: - Individual verbal instruction and demonstration of equipment use and safety with use of the equipment.      Cardiac Rehab from 09/05/2015 in [facility] Cardiac and Pulmonary Rehab   Date  05/14/15   Educator  Sb    Instruction Review Code  2- meets goals/outcomes      Infection Prevention: - Provides verbal and written material to individual with discussion of infection control including proper hand washing and proper equipment cleaning during exercise session.      Cardiac Rehab from 09/05/2015 in [facility] Cardiac and Pulmonary Rehab   Date  05/14/15   Educator  SB   Instruction Review Code  2- meets goals/outcomes      Falls Prevention: - Provides verbal and written material to individual with discussion of falls prevention and safety.      Cardiac Rehab from 09/05/2015 in [facility] Cardiac and Pulmonary Rehab   Date  05/14/15   Educator  SB   Instruction Review Code  2- meets goals/outcomes      Diabetes: - Individual verbal and written instruction to review signs/symptoms of diabetes, desired ranges of glucose level fasting, after meals and with exercise. Advice that pre and post exercise glucose checks will be done for 3 sessions at entry of program.    Knowledge Questionnaire Score:   Core Components/Risk Factors/Patient Goals at Admission:   Core Components/Risk Factors/Patient Goals Review:      Goals and Risk Factor Review      09/11/15 0751 10/08/15 1106  11/09/15 1349       Core Components/Risk Factors/Patient Goals Review   Personal Goals Review   Sedentary     Review   I left Claire Mcdonald a vm and asked her to let us know either way if she is plan on returning to Cardiac Rehab. Has not been here since Sep 07, 2015.     Expected Outcomes   To exercise in Cardiac Rehab.      Increase Aerobic Exercise and Physical Activity (read-only)   Goals Progress/Improvement seen  Yes No      Comments Made slight progression with exercise goals. Claire Mcdonald only game twice during this 30 day review. This inconsistent attendance makes progression difficult. We will follow up with her at her next visit on barriers to coming to class and what we can do to help.  Claire Mcdonald has been absent since 09/07/15. No  progression will be made until her return. Ex. rx. will be re-evaluated when she returns based upon her physical capacity.          Core Components/Risk Factors/Patient Goals at Discharge (Final Review):      Goals and Risk Factor Review - 11/09/15 1349    Core Components/Risk Factors/Patient Goals Review   Personal Goals Review Sedentary   Review I left Claire Mcdonald a vm and asked her to let us know either way if she is plan on returning to Cardiac Rehab. Has not been here since Sep 07, 2015.   Expected Outcomes To exercise in Cardiac Rehab.       ITP Comments:     ITP Comments      08/26/15 1237 09/20/15 1138 10/16/15 0813 11/15/15 1305 12/16/15 0933   ITP Comments Ready for 30 day review.  Continue with ITP  HAs not returned since orientation 05/14/2015. Planned to return 12/28, but called in sick. Ready for 30 day review. Continue with ITP. Claire Mcdonald has attended 2 sessions since last review. 30 day review.  Continue with ITP. 4 sessions only, last visit 09/07/2015 30 Day Review. Continue with the ITP.  HAS been absent since 1/13. Has been called with no response to [place] yet.  30 day review.  Continue with ITP  Remains absent     01/13/16 1243 02/06/16 1514 02/12/16 1522       ITP Comments 30 day review. Continue with ITP [place] absent , multiple calls  to [place] with messgages left on voicemail.  Pt has been out since last medical review. discharged  no show since Jan,  not returning calls        Comments:

## 2016-04-21 ENCOUNTER — Encounter: Payer: Self-pay | Admitting: *Deleted

## 2016-05-06 DIAGNOSIS — J45909 Unspecified asthma, uncomplicated: Secondary | ICD-10-CM

## 2016-05-06 DIAGNOSIS — J4530 Mild persistent asthma, uncomplicated: Secondary | ICD-10-CM | POA: Insufficient documentation

## 2016-05-06 DIAGNOSIS — K432 Incisional hernia without obstruction or gangrene: Secondary | ICD-10-CM | POA: Insufficient documentation

## 2016-05-06 HISTORY — DX: Unspecified asthma, uncomplicated: J45.909

## 2016-05-19 DIAGNOSIS — Z90710 Acquired absence of both cervix and uterus: Secondary | ICD-10-CM | POA: Insufficient documentation

## 2016-06-10 ENCOUNTER — Encounter: Payer: Self-pay | Admitting: Internal Medicine

## 2016-06-18 ENCOUNTER — Encounter: Payer: Self-pay | Admitting: Internal Medicine

## 2016-07-08 ENCOUNTER — Encounter: Payer: Medicaid Other | Admitting: Internal Medicine

## 2016-07-25 DIAGNOSIS — M25569 Pain in unspecified knee: Secondary | ICD-10-CM | POA: Insufficient documentation

## 2016-09-01 DIAGNOSIS — K449 Diaphragmatic hernia without obstruction or gangrene: Secondary | ICD-10-CM | POA: Insufficient documentation

## 2016-09-01 DIAGNOSIS — G43909 Migraine, unspecified, not intractable, without status migrainosus: Secondary | ICD-10-CM | POA: Insufficient documentation

## 2016-09-19 DIAGNOSIS — K5730 Diverticulosis of large intestine without perforation or abscess without bleeding: Secondary | ICD-10-CM | POA: Insufficient documentation

## 2016-10-19 DIAGNOSIS — Z8673 Personal history of transient ischemic attack (TIA), and cerebral infarction without residual deficits: Secondary | ICD-10-CM | POA: Insufficient documentation

## 2016-10-19 DIAGNOSIS — I639 Cerebral infarction, unspecified: Secondary | ICD-10-CM | POA: Insufficient documentation

## 2016-11-02 DIAGNOSIS — Z594 Lack of adequate food and safe drinking water: Secondary | ICD-10-CM | POA: Insufficient documentation

## 2016-11-02 DIAGNOSIS — I63412 Cerebral infarction due to embolism of left middle cerebral artery: Secondary | ICD-10-CM | POA: Insufficient documentation

## 2016-11-02 DIAGNOSIS — Z5941 Food insecurity: Secondary | ICD-10-CM | POA: Insufficient documentation

## 2016-11-29 IMAGING — CT CT ABD-PELV W/ CM
2 of 5 series · 16 of 46 positions shown, 18 images · IV contrast (omnipaque)
Comparison: 10/17/2013

CLINICAL DATA: Pelvic pain and right lower quadrant abdominal pain,
3 day duration. Worsened today.

EXAM:
CT ABDOMEN AND PELVIS WITH CONTRAST
TECHNIQUE: Multidetector CT imaging of the abdomen and pelvis was performed
using the standard protocol following bolus administration of
intravenous contrast.
CONTRAST:  100mL OMNIPAQUE IOHEXOL 300 MG/ML  SOLN

[Series 2: routine abd pel with · axial · 0.86mm/px · z∈[-515,-90]mm · 13 of 95 slices shown, 15 images]
[im 5/95  soft-tissue]
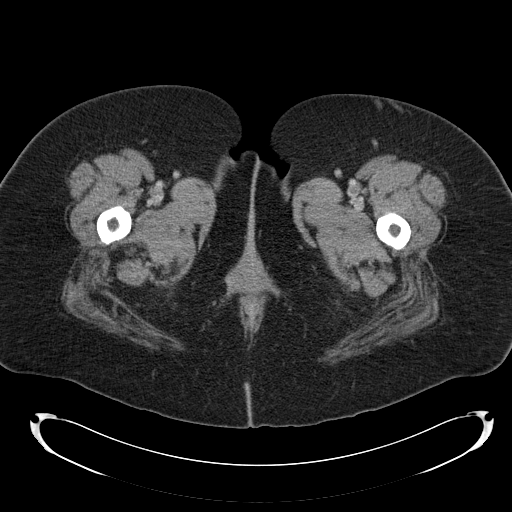
[im 5/95  bone]
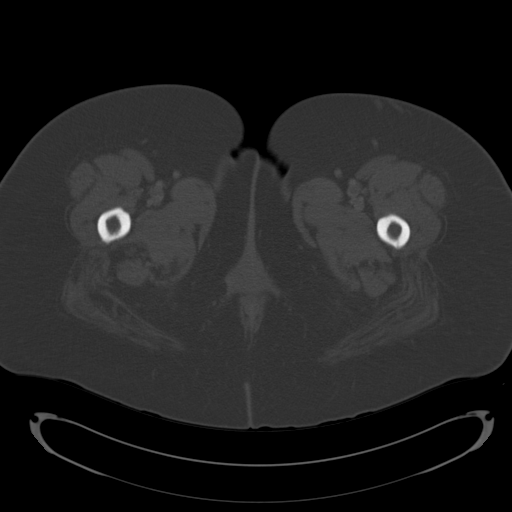
[im 15/95  soft-tissue]
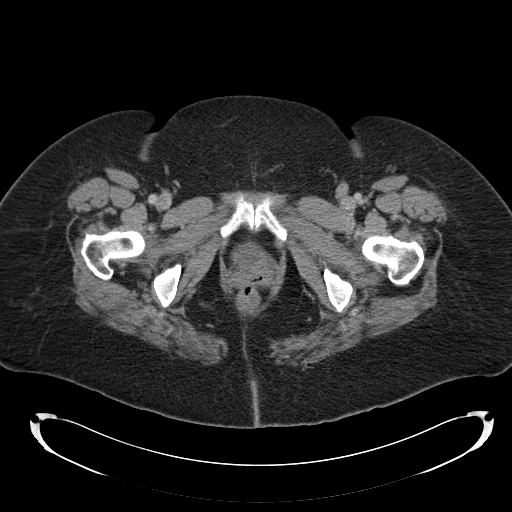
[im 20/95  soft-tissue]
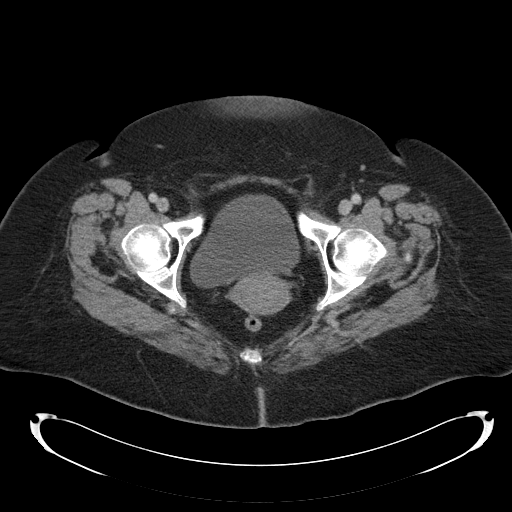
[im 25/95  soft-tissue]
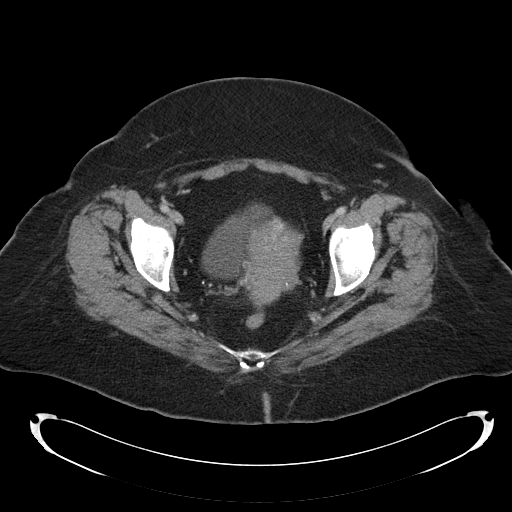
[im 35/95  soft-tissue]
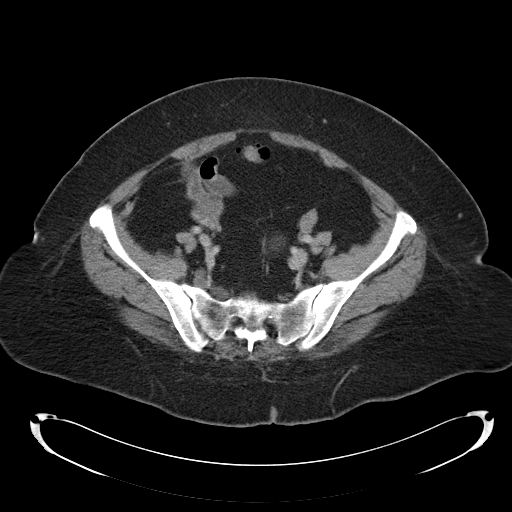
[im 40/95  soft-tissue]
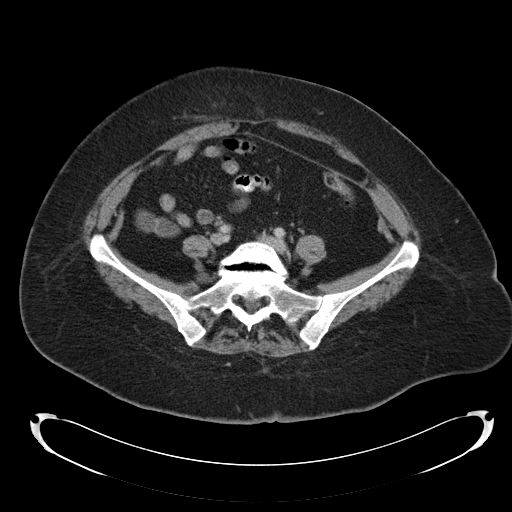
[im 50/95  soft-tissue]
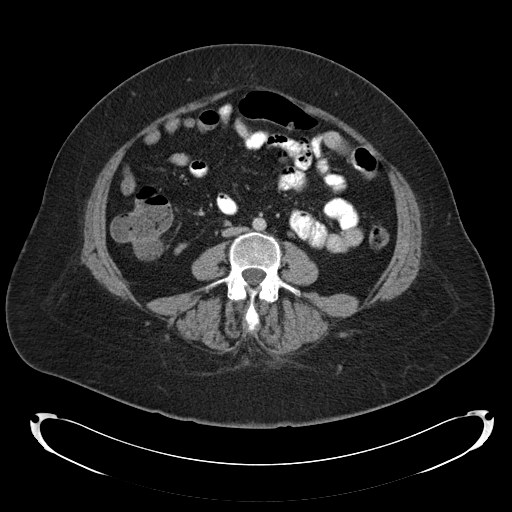
[im 55/95  soft-tissue]
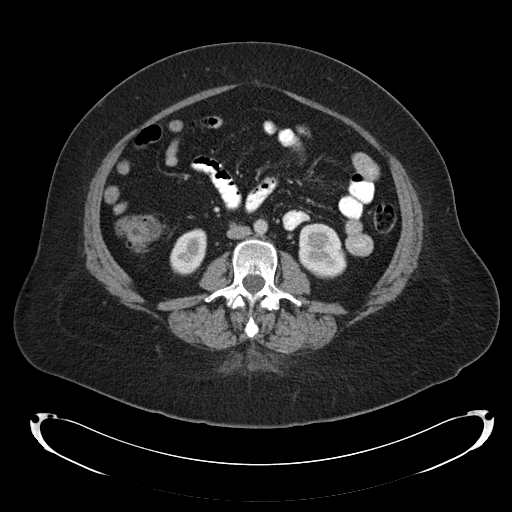
[im 60/95  soft-tissue]
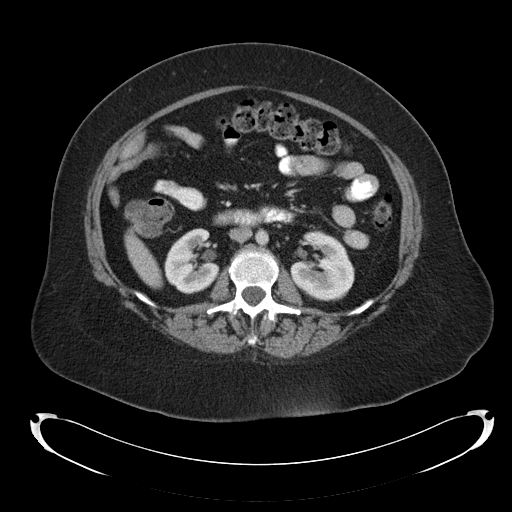
[im 60/95  bone]
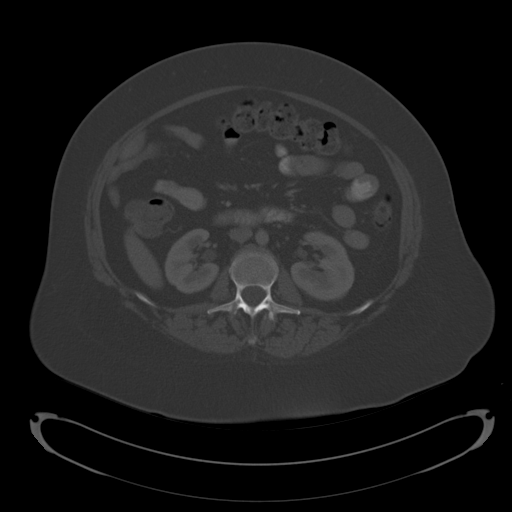
[im 70/95  soft-tissue]
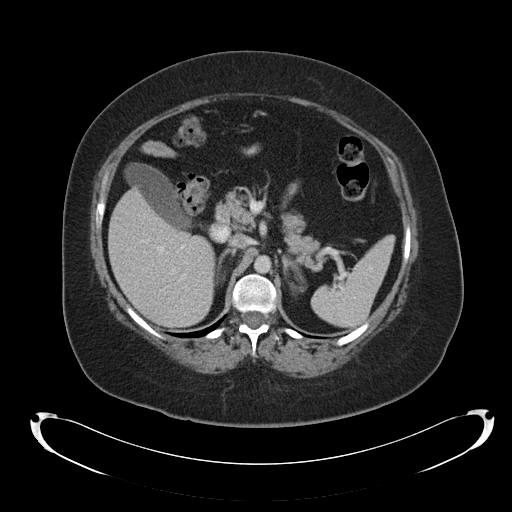
[im 75/95  soft-tissue]
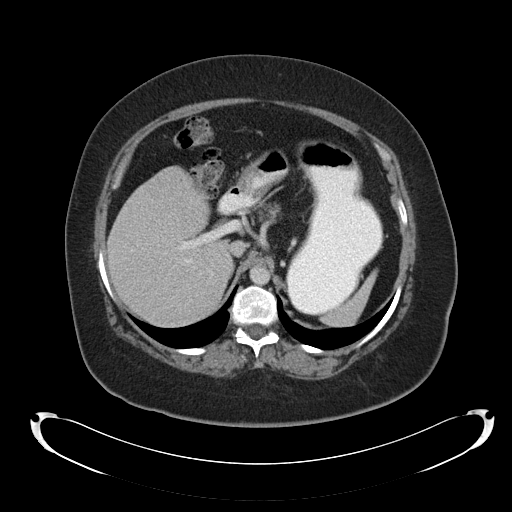
[im 80/95  soft-tissue]
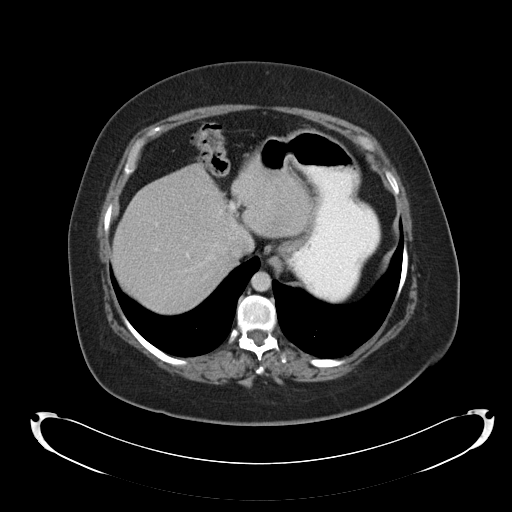
[im 90/95  soft-tissue]
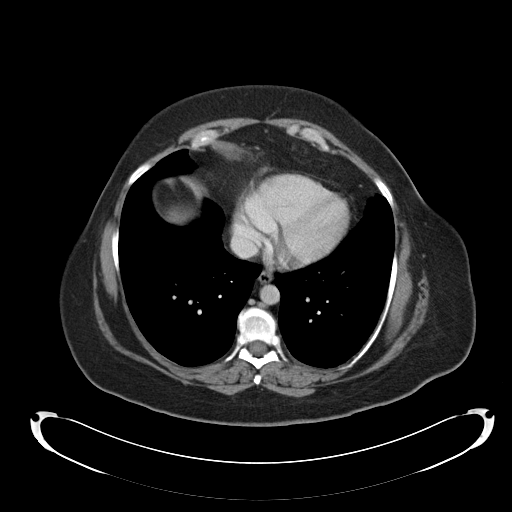

[Series 5: cor routine abd pel with · coronal · 0.77mm/px · 3 of 140 slices shown]
[im 47/140  soft-tissue]
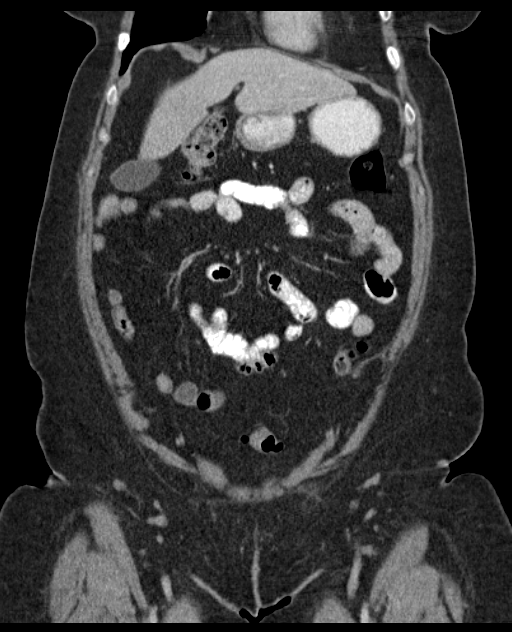
[im 62/140  soft-tissue]
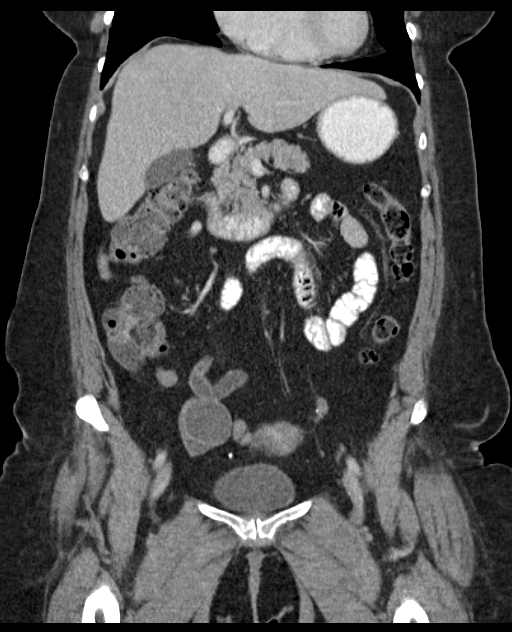
[im 78/140  soft-tissue]
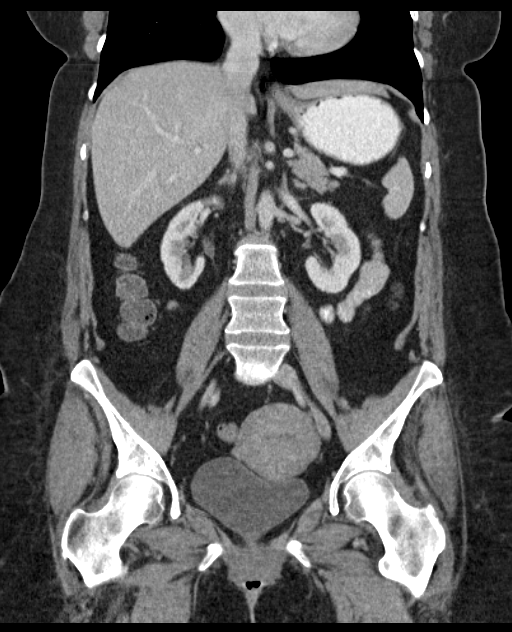

[16 of 46 positions shown; findings below may reference images not displayed]

FINDINGS: There are normal appearances of the liver, gallbladder and bile
ducts.

Pancreas and spleen appear normal. Adrenals, kidneys, collecting
systems, ureters and urinary bladder appear unremarkable.

There is a 4 cm right ovarian cyst. The uterus and ovaries are
otherwise normal in appearance.

There is colonic diverticulosis. There is no evidence of
diverticulitis or other acute inflammatory process in the abdomen or
pelvis. The appendix is normal. Stomach and small bowel appear
normal.

No acute inflammatory changes are evident in the abdomen or pelvis.
There is no adenopathy. There is no ascites.

There is a fat containing umbilical hernia which appears unchanged
from 10/17/2013.

There is bilateral L5 spondylolysis with grade 2 spondylolisthesis,
unchanged. No significant skeletal lesions are evident. There is no
significant abnormality in the lower chest.
IMPRESSION: *No acute inflammatory changes are evident in the abdomen or pelvis.
Normal appendix.
*Diverticulosis.
*4 cm benign-appearing right ovarian cyst. This does not require
additional imaging follow-up. This recommendation follows ACR
consensus guidelines: White Paper of the ACR Incidental Findings
Committee II on Adnexal Findings. [HOSPITAL] [DATE].
*Fat containing umbilical hernia
*Bilateral L5 spondylolysis with grade 2 spondylolisthesis

## 2016-12-04 DIAGNOSIS — R29818 Other symptoms and signs involving the nervous system: Secondary | ICD-10-CM | POA: Insufficient documentation

## 2017-01-26 DIAGNOSIS — G8929 Other chronic pain: Secondary | ICD-10-CM | POA: Insufficient documentation

## 2017-02-19 ENCOUNTER — Encounter: Payer: Self-pay | Admitting: Emergency Medicine

## 2017-02-19 ENCOUNTER — Emergency Department: Payer: Medicaid Other

## 2017-02-19 ENCOUNTER — Observation Stay
Admission: EM | Admit: 2017-02-19 | Discharge: 2017-02-21 | Disposition: A | Discharge status: Home / Self Care | Payer: Medicaid Other | Attending: Internal Medicine | Admitting: Internal Medicine

## 2017-02-19 DIAGNOSIS — E669 Obesity, unspecified: Secondary | ICD-10-CM | POA: Diagnosis not present

## 2017-02-19 DIAGNOSIS — R2681 Unsteadiness on feet: Secondary | ICD-10-CM | POA: Diagnosis not present

## 2017-02-19 DIAGNOSIS — G319 Degenerative disease of nervous system, unspecified: Secondary | ICD-10-CM | POA: Insufficient documentation

## 2017-02-19 DIAGNOSIS — F329 Major depressive disorder, single episode, unspecified: Secondary | ICD-10-CM | POA: Diagnosis not present

## 2017-02-19 DIAGNOSIS — I5022 Chronic systolic (congestive) heart failure: Secondary | ICD-10-CM | POA: Diagnosis not present

## 2017-02-19 DIAGNOSIS — Z86718 Personal history of other venous thrombosis and embolism: Secondary | ICD-10-CM | POA: Insufficient documentation

## 2017-02-19 DIAGNOSIS — R1084 Generalized abdominal pain: Secondary | ICD-10-CM

## 2017-02-19 DIAGNOSIS — I472 Ventricular tachycardia: Secondary | ICD-10-CM | POA: Diagnosis not present

## 2017-02-19 DIAGNOSIS — Z7901 Long term (current) use of anticoagulants: Secondary | ICD-10-CM | POA: Insufficient documentation

## 2017-02-19 DIAGNOSIS — M5030 Other cervical disc degeneration, unspecified cervical region: Secondary | ICD-10-CM | POA: Insufficient documentation

## 2017-02-19 DIAGNOSIS — F419 Anxiety disorder, unspecified: Secondary | ICD-10-CM | POA: Diagnosis not present

## 2017-02-19 DIAGNOSIS — Z79899 Other long term (current) drug therapy: Secondary | ICD-10-CM | POA: Insufficient documentation

## 2017-02-19 DIAGNOSIS — I110 Hypertensive heart disease with heart failure: Secondary | ICD-10-CM | POA: Diagnosis not present

## 2017-02-19 DIAGNOSIS — I2510 Atherosclerotic heart disease of native coronary artery without angina pectoris: Secondary | ICD-10-CM | POA: Diagnosis not present

## 2017-02-19 DIAGNOSIS — R531 Weakness: Secondary | ICD-10-CM

## 2017-02-19 DIAGNOSIS — G40909 Epilepsy, unspecified, not intractable, without status epilepticus: Secondary | ICD-10-CM | POA: Diagnosis not present

## 2017-02-19 DIAGNOSIS — R112 Nausea with vomiting, unspecified: Secondary | ICD-10-CM | POA: Insufficient documentation

## 2017-02-19 DIAGNOSIS — Z95810 Presence of automatic (implantable) cardiac defibrillator: Secondary | ICD-10-CM | POA: Insufficient documentation

## 2017-02-19 DIAGNOSIS — I639 Cerebral infarction, unspecified: Secondary | ICD-10-CM

## 2017-02-19 DIAGNOSIS — Z885 Allergy status to narcotic agent status: Secondary | ICD-10-CM | POA: Diagnosis not present

## 2017-02-19 DIAGNOSIS — R202 Paresthesia of skin: Principal | ICD-10-CM | POA: Insufficient documentation

## 2017-02-19 DIAGNOSIS — Z91041 Radiographic dye allergy status: Secondary | ICD-10-CM | POA: Insufficient documentation

## 2017-02-19 DIAGNOSIS — I081 Rheumatic disorders of both mitral and tricuspid valves: Secondary | ICD-10-CM | POA: Diagnosis not present

## 2017-02-19 DIAGNOSIS — E7800 Pure hypercholesterolemia, unspecified: Secondary | ICD-10-CM | POA: Insufficient documentation

## 2017-02-19 DIAGNOSIS — Z6841 Body Mass Index (BMI) 40.0 and over, adult: Secondary | ICD-10-CM | POA: Insufficient documentation

## 2017-02-19 DIAGNOSIS — Z8673 Personal history of transient ischemic attack (TIA), and cerebral infarction without residual deficits: Secondary | ICD-10-CM | POA: Insufficient documentation

## 2017-02-19 DIAGNOSIS — Z8674 Personal history of sudden cardiac arrest: Secondary | ICD-10-CM | POA: Insufficient documentation

## 2017-02-19 DIAGNOSIS — M5136 Other intervertebral disc degeneration, lumbar region: Secondary | ICD-10-CM | POA: Diagnosis not present

## 2017-02-19 DIAGNOSIS — K219 Gastro-esophageal reflux disease without esophagitis: Secondary | ICD-10-CM | POA: Diagnosis not present

## 2017-02-19 DIAGNOSIS — G43909 Migraine, unspecified, not intractable, without status migrainosus: Secondary | ICD-10-CM | POA: Diagnosis not present

## 2017-02-19 DIAGNOSIS — Z888 Allergy status to other drugs, medicaments and biological substances status: Secondary | ICD-10-CM | POA: Diagnosis not present

## 2017-02-19 DIAGNOSIS — R197 Diarrhea, unspecified: Secondary | ICD-10-CM | POA: Insufficient documentation

## 2017-02-19 LAB — PHOSPHORUS: PHOSPHORUS: 3.8 mg/dL (ref 2.5–4.6)

## 2017-02-19 LAB — BASIC METABOLIC PANEL
Anion gap: 7 (ref 5–15)
BUN: 21 mg/dL — ABNORMAL HIGH (ref 6–20)
CHLORIDE: 103 mmol/L (ref 101–111)
CO2: 26 mmol/L (ref 22–32)
Calcium: 9.5 mg/dL (ref 8.9–10.3)
Creatinine, Ser: 0.7 mg/dL (ref 0.44–1.00)
GFR calc non Af Amer: >60 mL/min (ref >60)
Glucose, Bld: 106 mg/dL — ABNORMAL HIGH (ref 65–99)
Potassium: 3.7 mmol/L (ref 3.5–5.1)
Sodium: 136 mmol/L (ref 135–145)

## 2017-02-19 LAB — CBC
HCT: 43.8 % (ref 35.0–47.0)
Hemoglobin: 14.6 g/dL (ref 12.0–16.0)
MCH: 29 pg (ref 26.0–34.0)
MCHC: 33.2 g/dL (ref 32.0–36.0)
MCV: 87.4 fL (ref 80.0–100.0)
PLATELETS: 274 10*3/uL (ref 150–440)
RBC: 5.02 MIL/uL (ref 3.80–5.20)
RDW: 15 % — ABNORMAL HIGH (ref 11.5–14.5)
WBC: 8.5 10*3/uL (ref 3.6–11.0)

## 2017-02-19 LAB — HEPATIC FUNCTION PANEL
ALT: 16 U/L (ref 14–54)
AST: 24 U/L (ref 15–41)
Albumin: 4.2 g/dL (ref 3.5–5.0)
Alkaline Phosphatase: 76 U/L (ref 38–126)
Bilirubin, Direct: <0.1 mg/dL — ABNORMAL LOW (ref 0.1–0.5)
TOTAL PROTEIN: 8.1 g/dL (ref 6.5–8.1)
Total Bilirubin: 0.5 mg/dL (ref 0.3–1.2)

## 2017-02-19 LAB — LIPASE, BLOOD: Lipase: 50 U/L (ref 11–51)

## 2017-02-19 LAB — ETHANOL

## 2017-02-19 LAB — TROPONIN I: Troponin I: <0.03 ng/mL (ref <0.03)

## 2017-02-19 LAB — MAGNESIUM: MAGNESIUM: 2.1 mg/dL (ref 1.7–2.4)

## 2017-02-19 MED ORDER — PROMETHAZINE HCL 25 MG/ML IJ SOLN
12.5000 mg | Freq: Once | INTRAMUSCULAR | Status: AC
  Administered 2017-02-19: 12.5 mg via INTRAVENOUS
  Filled 2017-02-19: qty 1

## 2017-02-19 MED ORDER — MORPHINE SULFATE (PF) 4 MG/ML IV SOLN
4.0000 mg | Freq: Once | INTRAVENOUS | Status: AC
  Administered 2017-02-19: 4 mg via INTRAVENOUS
  Filled 2017-02-19: qty 1

## 2017-02-19 MED ORDER — SODIUM CHLORIDE 0.9 % IV BOLUS (SEPSIS)
1000.0000 mL | Freq: Once | INTRAVENOUS | Status: AC
  Administered 2017-02-19: 1000 mL via INTRAVENOUS

## 2017-02-19 NOTE — ED Provider Notes (Addendum)
[facility] Emergency Department Provider Note  ____________________________________________  Time seen: Approximately 8:01 PM  I have reviewed the triage vital signs and the nursing notes.   HISTORY  Chief Complaint Weakness and Numbness    HPI Claire Mcdonald is a 51 y.o. female who has multiple complaints. First is that she has left-sided weakness and paresthesias since last night which is been constant all day. Denies any headache vision changes or neck pain. She has a history of stroke affecting the right side with residual weakness and paresthesias.  She also complains of generalized abdominal pain with vomiting for the past 2 days. Decreased oral intake. Also at times endorses chest pain. Pain is nonradiating, without aggravating or alleviating factors. Waxing and waning, mild to moderate intensity.     Past Medical History:  Diagnosis Date  . Allergy   . Anxiety   . Coronary artery disease   . DDD (degenerative disc disease), cervical   . Depression   . Diverticulosis    seen on CT 2015  . DVT (deep venous thrombosis) ([place])   . GERD (gastroesophageal reflux disease)   . Heart failure ([place])   . High cholesterol   . Hyperlipidemia   . Hypertension   . Idiopathic cardiac arrest ([place]) 02/2015   V. fib, EF nl, non-obs CAD; St. Jude ICD  . Migraine   . Obesity (BMI 30-39.9)   . Seizures ([place])   . Ventricular tachycardia [facility])      Patient Active Problem List   Diagnosis Date Noted  . 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 VT (ventricular tachycardia) ([place]) 07/16/2015  . CAD (coronary artery disease) 04/16/2015  . Seizure disorder ([place]) 04/16/2015  . Heart failure ([place]) 04/16/2015  . Normal coronary arteries 04/09/2015  . Obesity-BMI 40 04/09/2015  . Pulmonary nodule seen on imaging study 04/09/2015  . Chronic anticoagulation 04/09/2015  . DVT (deep venous thrombosis) ([place])   . Cough   . Dyspnea   .  Essential hypertension   . Hyperlipemia   . Cardiac arrest; V. fib, St. Jude ICD 03/25/2015  . DDD (degenerative disc disease), lumbar 01/28/2015  . Lumbar facet arthropathy ([place]) 01/28/2015  . 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 CARDIAC CATHETERIZATION N/A 03/27/2015   Procedure: Left Heart Cath and Coronary Angiography;  Surgeon: Troy Sine, MD;  LAD 10%/30%, CFX and RCA OK, EF normal  . CENTRAL LINE  03/25/2015      . COLONOSCOPY WITH PROPOFOL N/A 10/01/2015   Procedure: COLONOSCOPY WITH PROPOFOL;  Surgeon: Josefine Class, MD;  Location: [facility] ENDOSCOPY;  Service: Endoscopy;  Laterality: N/A;  . EP IMPLANTABLE DEVICE N/A 04/11/2015   Procedure: ICD Implant;  Surgeon: Deboraha Sprang, MD;  [facility] ICD, serial number [id number]  . EP IMPLANTABLE DEVICE N/A 07/16/2015   Procedure: Lead Revision/Repair;  Surgeon: Will Meredith Leeds, MD;  Location: [place] CV LAB;  Service: Cardiovascular;  Laterality: N/A;  . ESOPHAGOGASTRODUODENOSCOPY (EGD) WITH PROPOFOL N/A 10/01/2015   Procedure: ESOPHAGOGASTRODUODENOSCOPY (EGD) WITH PROPOFOL;  Surgeon: Josefine Class, MD;  Location: [facility] ENDOSCOPY;  Service: Endoscopy;  Laterality: N/A;  . HAND SURGERY Left   . OVARIAN CYST REMOVAL Left   . TUBAL LIGATION  1998     Prior to Admission medications   Medication Sig Start Date End Date Taking? Authorizing Provider  albuterol (PROVENTIL HFA;VENTOLIN HFA) 108 (90 BASE) MCG/ACT inhaler Inhale 2 puffs into the  lungs every 6 (six) hours as needed for wheezing or shortness of breath. 01/23/15   Menshew, Dannielle Karvonen, PA-C  apixaban (ELIQUIS) 5 MG TABS tablet Take 1 tablet (5 mg total) by mouth 2 (two) times daily. 08/03/15   [name], NP  atorvastatin (LIPITOR) 20 MG tablet Take 1 tablet (20 mg total) by mouth daily at 6 PM. 07/06/15   [name], NP  cyclobenzaprine (FLEXERIL) 10 MG tablet Take 10 mg by mouth 3 (three) times daily  as needed for muscle spasms.    [provider]  dicyclomine (BENTYL) 20 MG tablet Take 1 tablet (20 mg total) by mouth 3 (three) times daily as needed for spasms. 07/20/15   Daymon Larsen, MD  DULoxetine (CYMBALTA) 20 MG capsule Take 20 mg by mouth daily. Reported on 11/20/2015 09/14/15   [provider]  DULoxetine (CYMBALTA) 60 MG capsule Take 60 mg by mouth daily. Reported on 11/20/2015    [provider]  furosemide (LASIX) 20 MG tablet Take 20 mg by mouth daily and may take an additional daily as directed 09/21/15   Deboraha Sprang, MD  hyoscyamine (LEVSIN SL) 0.125 MG SL tablet Place 0.125 mg under the tongue every 4 (four) hours as needed. Reported on 11/20/2015    [provider]  levETIRAcetam (KEPPRA) 500 MG tablet Take 1 tablet (500 mg total) by mouth 2 (two) times daily. 07/06/15   [name], NP  metoprolol tartrate (LOPRESSOR) 25 MG tablet Take 0.5 tablets (12.5 mg total) by mouth 2 (two) times daily. 08/03/15   [name], NP  omeprazole (PRILOSEC) 20 MG capsule Take 20 mg by mouth daily.    [provider]  promethazine (PHENERGAN) 12.5 MG suppository Place 1 suppository (12.5 mg total) rectally every 6 (six) hours as needed for nausea or vomiting. Patient not taking: Reported on 11/20/2015 07/20/15   Daymon Larsen, MD  sertraline (ZOLOFT) 100 MG tablet Take 100 mg by mouth daily. 09/14/15 09/13/16  [provider]  topiramate (TOPAMAX) 100 MG tablet Take 1 tablet (100 mg total) by mouth 2 (two) times daily. 04/12/15   Kinnie Feil, MD  traZODone (DESYREL) 50 MG tablet Take 50 mg by mouth at bedtime as needed for sleep.    [provider]     Allergies Iodinated diagnostic agents; Toradol [ketorolac tromethamine]; Tramadol; and Zofran [ondansetron hcl]   Family History  Problem Relation Age of Onset  . Diabetes Father   . Asthma Father   . Hyperlipidemia Father   . Hypertension Father   . Kidney  disease Father   . Asthma Mother   . Depression Mother   . Hyperlipidemia Mother   . Hypertension Mother   . Cancer Maternal Grandmother   . Early death Maternal Grandmother   . Early death Paternal Grandmother   . Allergies Daughter   . Allergies Son   . Allergies Son   . Heart disease Son   . Stroke Neg Hx     Social History Social History  Substance Use Topics  . Smoking status: Never Smoker  . Smokeless tobacco: Never Used  . Alcohol use No    Review of Systems  Constitutional:   No fever or chills.  ENT:   No sore throat. No rhinorrhea. Cardiovascular:   Positive vague chest pain without radiation shortness of breath diaphoresis. Not exertional. Possible syncope last night. Respiratory:   No dyspnea or cough. Gastrointestinal:   Positive abdominal pain with vomiting. No constipation.Marland Kitchen  Musculoskeletal:   Negative for focal pain or swelling All other systems reviewed and are negative except as documented above in ROS and HPI.  ____________________________________________   PHYSICAL EXAM:  VITAL SIGNS: ED Triage Vitals  Enc Vitals Group     BP 02/19/17 1811 (!) 147/100     Pulse Rate 02/19/17 1811 74     Resp 02/19/17 1811 16     Temp 02/19/17 1811 97.7 F (36.5 C)     Temp Source 02/19/17 1811 Oral     SpO2 02/19/17 1811 96 %     Weight 02/19/17 1806 215 lb (97.5 kg)     Height 02/19/17 1806 5\' 1"  (1.549 m)     Head Circumference --      Peak Flow --      Pain Score 02/19/17 1806 10     Pain Loc --      Pain Edu? --      Excl. in [place]? --     Vital signs reviewed, nursing assessments reviewed.   Constitutional:   Alert and oriented. Not in distress. Eyes:   No scleral icterus.  EOMI. No nystagmus. No conjunctival pallor. PERRL. ENT   Head:   Normocephalic and atraumatic.   Nose:   No congestion/rhinnorhea.    Mouth/Throat:   MMM, no pharyngeal erythema. No peritonsillar mass.    Neck:   No meningismus. Full  ROM Hematological/Lymphatic/Immunilogical:   No cervical lymphadenopathy. Cardiovascular:   RRR. Symmetric bilateral radial and DP pulses.  No murmurs.  Respiratory:   Normal respiratory effort without tachypnea/retractions. Breath sounds are clear and equal bilaterally. No wheezes/rales/rhonchi. Gastrointestinal:   Soft with generalized tenderness. Non distended. There is no CVA tenderness.  No rebound, rigidity, or guarding. Genitourinary:   deferred Musculoskeletal:   Normal range of motion in all extremities. No joint effusions.  No lower extremity tenderness.  No edema. Neurologic:   Normal speech and language.  Cranial nerves II through X intact. Oriented. Intact object recognition. No pronator drift. Normal finger to nose 3 out of 5 strength in right upper extremity and right lower extremity, chronic per patient 5 out of 5 strength in left upper extremity and left lower extremity Subjective Paresthesia in right side of body, chronic per patient Subjective paresthesia left side of body NIH stroke scale 6, suspect chronic baseline is a stroke scale of 4   Skin:    Skin is warm, dry and intact. No rash noted.  No petechiae, purpura, or bullae.  ____________________________________________    LABS (pertinent positives/negatives) (all labs ordered are listed, but only abnormal results are displayed) Labs Reviewed  BASIC METABOLIC PANEL - Abnormal; Notable for the following:       Result Value   Glucose, Bld 106 (*)    BUN 21 (*)    All other components within normal limits  CBC - Abnormal; Notable for the following:    RDW 15.0 (*)    All other components within normal limits  HEPATIC FUNCTION PANEL - Abnormal; Notable for the following:    Bilirubin, Direct <0.1 (*)    All other components within normal limits  TROPONIN I  LIPASE, BLOOD  ETHANOL  MAGNESIUM  PHOSPHORUS  URINALYSIS, COMPLETE (UACMP) WITH MICROSCOPIC    ____________________________________________   EKG  Interpreted by me Sinus rhythm rate of 66, normal axis and intervals. Normal QRS ST segments and T waves.  ____________________________________________    RADIOLOGY  Dg Chest 2 View  Result Date: 02/19/2017 CLINICAL DATA:  Bilateral  upper extremity numbness and weakness since last night. EXAM: CHEST  2 VIEW COMPARISON:  09/21/2015. FINDINGS: Normal sized heart. Clear lungs with normal vascularity. Minimal thoracic spine degenerative changes. Stable left subclavian AICD lead. IMPRESSION: No acute abnormality. Electronically Signed   By: Claudie Revering M.D.   On: 02/19/2017 18:58   Ct Head Wo Contrast  Result Date: 02/19/2017 CLINICAL DATA:  Bilateral upper extremity numbness and weakness since last night. Chronic right-sided deficits from a previous stroke, worse tonight. EXAM: CT HEAD WITHOUT CONTRAST TECHNIQUE: Contiguous axial images were obtained from the base of the skull through the vertex without intravenous contrast. COMPARISON:  04/11/2015. FINDINGS: Brain: Progressive enlargement of the ventricles and subarachnoid spaces. Interval old left corona radiata and external capsule infarct. Interval old right cerebellar hemisphere infarct. No intracranial hemorrhage, mass lesion or CT evidence of acute infarction. Vascular: No hyperdense vessel or unexpected calcification. Skull: Normal. Negative for fracture or focal lesion. Sinuses/Orbits: Unremarkable. Other: None. IMPRESSION: 1. No acute abnormality. 2. Mildly progressive diffuse cerebral atrophy. 3. Interval old left cerebral and right cerebellar infarcts, as described above. Electronically Signed   By: Claudie Revering M.D.   On: 02/19/2017 19:41    ____________________________________________   PROCEDURES Procedures  ____________________________________________   INITIAL IMPRESSION / ASSESSMENT AND PLAN / ED COURSE  Pertinent labs & imaging results that were available during  my care of the patient were reviewed by me and considered in my medical decision making (see chart for details).  Patient presents with multiple complaints, she is most concerned about left-sided paresthesia. She presents out of window for any possible intervention, and her acute stroke scale is minimal. CT negative. Patient has a defibrillator, not able to obtain an MRI at this facility. She is anticoagulated with Eliquis, suspicion for acute stroke is low. I think this can be deferred to outpatient management.  Also complains of abdominal pain and vomiting. She is tender. We'll get a CT scan of the abdomen. She has a contrast allergy so to be without. Case signed out to Dr. Beather Arbour to follow up on CT     ____________________________________________   FINAL CLINICAL IMPRESSION(S) / ED DIAGNOSES  Final diagnoses:  Paresthesias  Generalized abdominal pain  Non-intractable vomiting with nausea, unspecified vomiting type      New Prescriptions   No medications on file     Portions of this note were generated with dragon dictation software. Dictation errors may occur despite best attempts at proofreading.    Carrie Mew, MD 02/19/17 [place]    Carrie Mew, MD 02/19/17 [phone]

## 2017-02-19 NOTE — ED Triage Notes (Addendum)
Pt to ED via EMS from home with c/o bilat upper extremity numbness/weakness since last night. Pt hx of stroke and has RT side deficets but states worse than normal. Pt A&OX4, speech clear. Per EMS cbg 113. Pt also c/o CP and states she had syncopal episode last night, unaware of head injury. Pt takes blood thinners

## 2017-02-19 NOTE — ED Notes (Signed)
Pt calling out.  Stated in pain and nauseous.  MD notified.

## 2017-02-19 NOTE — ED Notes (Addendum)
Pt up to give urine sample, blood in urine hat, states she thought she was off period.  Unable to obtain clean urine catch

## 2017-02-20 ENCOUNTER — Observation Stay
Admit: 2017-02-20 | Discharge: 2017-02-20 | Disposition: A | Discharge status: Home / Self Care | Payer: Medicaid Other | Attending: Internal Medicine | Admitting: Internal Medicine

## 2017-02-20 ENCOUNTER — Observation Stay: Payer: Medicaid Other

## 2017-02-20 DIAGNOSIS — R202 Paresthesia of skin: Secondary | ICD-10-CM | POA: Diagnosis not present

## 2017-02-20 DIAGNOSIS — R531 Weakness: Secondary | ICD-10-CM

## 2017-02-20 LAB — URINALYSIS, COMPLETE (UACMP) WITH MICROSCOPIC
Bilirubin Urine: NEGATIVE
Glucose, UA: NEGATIVE mg/dL
Ketones, ur: NEGATIVE mg/dL
Leukocytes, UA: NEGATIVE
Nitrite: NEGATIVE
Protein, ur: NEGATIVE mg/dL
Specific Gravity, Urine: 1.014 (ref 1.005–1.030)
pH: 5 (ref 5.0–8.0)

## 2017-02-20 LAB — TSH: TSH: 1.284 u[IU]/mL (ref 0.350–4.500)

## 2017-02-20 LAB — ECHOCARDIOGRAM COMPLETE
HEIGHTINCHES: 61 in
Weight: 3531.2 oz

## 2017-02-20 MED ORDER — ACETAMINOPHEN 650 MG RE SUPP: 650.0000 mg | Freq: Four times a day (QID) | RECTAL | Status: DC | PRN

## 2017-02-20 MED ORDER — STROKE: EARLY STAGES OF RECOVERY BOOK
Freq: Once | Status: AC
  Administered 2017-02-20: 11:00:00

## 2017-02-20 MED ORDER — TOPIRAMATE 100 MG PO TABS
100.0000 mg | ORAL_TABLET | Freq: Two times a day (BID) | ORAL | Status: DC
  Administered 2017-02-20 – 2017-02-21 (×3): 100 mg via ORAL
  Filled 2017-02-20 (×4): qty 1

## 2017-02-20 MED ORDER — DULOXETINE HCL 30 MG PO CPEP
60.0000 mg | ORAL_CAPSULE | Freq: Every day | ORAL | Status: DC
  Administered 2017-02-20: 60 mg via ORAL
  Filled 2017-02-20 (×2): qty 2

## 2017-02-20 MED ORDER — TRAZODONE HCL 50 MG PO TABS
50.0000 mg | ORAL_TABLET | Freq: Every evening | ORAL | Status: DC | PRN
Start: 2017-02-20 — End: 2017-02-21

## 2017-02-20 MED ORDER — APIXABAN 5 MG PO TABS
5.0000 mg | ORAL_TABLET | Freq: Two times a day (BID) | ORAL | Status: DC
  Administered 2017-02-20 – 2017-02-21 (×3): 5 mg via ORAL
  Filled 2017-02-20 (×3): qty 1

## 2017-02-20 MED ORDER — FUROSEMIDE 20 MG PO TABS
20.0000 mg | ORAL_TABLET | Freq: Every day | ORAL | Status: DC
  Administered 2017-02-20 – 2017-02-21 (×2): 20 mg via ORAL
  Filled 2017-02-20 (×2): qty 1

## 2017-02-20 MED ORDER — LORAZEPAM 2 MG/ML IJ SOLN: 1.0000 mg | Freq: Once | INTRAMUSCULAR | Status: DC

## 2017-02-20 MED ORDER — PANTOPRAZOLE SODIUM 40 MG PO TBEC
40.0000 mg | DELAYED_RELEASE_TABLET | Freq: Every day | ORAL | Status: DC
  Administered 2017-02-20 – 2017-02-21 (×2): 40 mg via ORAL
  Filled 2017-02-20 (×2): qty 1

## 2017-02-20 MED ORDER — HYOSCYAMINE SULFATE 0.125 MG SL SUBL
0.1250 mg | SUBLINGUAL_TABLET | SUBLINGUAL | Status: DC | PRN
  Administered 2017-02-20: 05:00:00 0.125 mg via SUBLINGUAL
  Filled 2017-02-20 (×2): qty 1

## 2017-02-20 MED ORDER — CYCLOBENZAPRINE HCL 10 MG PO TABS
10.0000 mg | ORAL_TABLET | Freq: Three times a day (TID) | ORAL | Status: DC | PRN
Start: 2017-02-20 — End: 2017-02-21

## 2017-02-20 MED ORDER — PROMETHAZINE HCL 25 MG PO TABS
12.5000 mg | ORAL_TABLET | Freq: Four times a day (QID) | ORAL | Status: DC | PRN
  Filled 2017-02-20: qty 1

## 2017-02-20 MED ORDER — DULOXETINE HCL 20 MG PO CPEP
20.0000 mg | ORAL_CAPSULE | Freq: Every day | ORAL | Status: DC
  Administered 2017-02-20: 11:00:00 20 mg via ORAL
  Filled 2017-02-20 (×3): qty 1

## 2017-02-20 MED ORDER — ATORVASTATIN CALCIUM 20 MG PO TABS
20.0000 mg | ORAL_TABLET | Freq: Every day | ORAL | Status: DC
  Administered 2017-02-20: 20 mg via ORAL
  Filled 2017-02-20: qty 1

## 2017-02-20 MED ORDER — LEVETIRACETAM 500 MG PO TABS
500.0000 mg | ORAL_TABLET | Freq: Two times a day (BID) | ORAL | Status: DC
  Administered 2017-02-20 – 2017-02-21 (×3): 500 mg via ORAL
  Filled 2017-02-20 (×4): qty 1

## 2017-02-20 MED ORDER — SERTRALINE HCL 50 MG PO TABS
200.0000 mg | ORAL_TABLET | Freq: Every day | ORAL | Status: DC
  Administered 2017-02-20 – 2017-02-21 (×2): 200 mg via ORAL
  Filled 2017-02-20 (×2): qty 4

## 2017-02-20 MED ORDER — METOPROLOL TARTRATE 25 MG PO TABS
12.5000 mg | ORAL_TABLET | Freq: Two times a day (BID) | ORAL | Status: DC
  Administered 2017-02-20 – 2017-02-21 (×3): 12.5 mg via ORAL
  Filled 2017-02-20 (×3): qty 1

## 2017-02-20 MED ORDER — ACETAMINOPHEN 325 MG PO TABS
650.0000 mg | ORAL_TABLET | Freq: Four times a day (QID) | ORAL | Status: DC | PRN
  Administered 2017-02-21: 09:00:00 650 mg via ORAL
  Filled 2017-02-20: qty 2

## 2017-02-20 MED ORDER — DICYCLOMINE HCL 20 MG PO TABS
20.0000 mg | ORAL_TABLET | Freq: Three times a day (TID) | ORAL | Status: DC | PRN
  Filled 2017-02-20: qty 1

## 2017-02-20 MED ORDER — SODIUM CHLORIDE 0.9 % IV SOLN
INTRAVENOUS | Status: DC
  Administered 2017-02-20: 05:00:00 via INTRAVENOUS

## 2017-02-20 MED ORDER — DOCUSATE SODIUM 100 MG PO CAPS
100.0000 mg | ORAL_CAPSULE | Freq: Two times a day (BID) | ORAL | Status: DC
  Administered 2017-02-20 – 2017-02-21 (×3): 100 mg via ORAL
  Filled 2017-02-20 (×3): qty 1

## 2017-02-20 NOTE — Evaluation (Signed)
Physical Therapy Evaluation Patient Details Name: Claire Mcdonald MRN: [id number] DOB: 06-Feb-1966 Today's Date: 02/20/2017   History of Present Illness  Pt is a 51 yo female who reports she suffered a CVA on 10-17-16 with right sided weakness, she was discharged home and received home health PT.  She reports she was at home with her sons and started to not feel well, numbness and tingling on her left side and reports her mouth was "twisted".  She called EMS and was transported to [facility] for further testing. At time of PT evaluation pt reports return to baseline. She is still awaiting brain MRI.   Clinical Impression  Pt admitted with above diagnosis. Pt currently with functional limitations due to the deficits listed below (see PT Problem List). Pt is independent with bed mobility and transfers. Supervision only for ambulation with quad cane. Somewhat unsteady but able to self-correct with assistive device. Pt reports complete resolution of her complaints at this time. Unable to find any LUE or LLE deficits with strength/sensation testing. She does have residual R side deficits from CVA in February 2018. She also has deficits in balance in narrow and single leg stance. She would benefit from further OP PT services for balance/strengthening but unsure whether insurance would cover any additional PT. If no coverage [facility] clinic might be an option. Pt will benefit from PT services to address deficits in strength, balance, and mobility in order to return to full function at home.     Follow Up Recommendations Outpatient PT;Other (comment) (If medicaid won't cover consider [facility])    Equipment Recommendations  None recommended by PT    Recommendations for Other Services       Precautions / Restrictions Precautions Precautions: None Restrictions Weight Bearing Restrictions: No      Mobility  Bed Mobility Overal bed mobility: Independent             General bed mobility comments:  HOB minimally elevated. No use of bed rails. Mild increase in time but able to perform without external assistance  Transfers Overall transfer level: Modified independent Equipment used: Quad cane             General transfer comment: Pt able to perform with good safety and stability. Slight increase in time but [facility]  Ambulation/Gait Ambulation/Gait assistance: Supervision Ambulation Distance (Feet): 80 Feet Assistive device: Quad cane Gait Pattern/deviations: Decreased step length - right;Decreased step length - left Gait velocity: Decreased Gait velocity interpretation: <1.8 ft/sec, indicative of risk for recurrent falls General Gait Details: Pt takes short shuffling steps. She is mildly unsteady but able to self-correct with quad cane. She prefers to ambulate with QC in RUE. Discussed typical pattern of using in side opposite to the effected extremity but pt is comfortable and confident with QC in RUE. Gait speed is significantly slowed but functional for household mobility. No overt LOB  Stairs            Wheelchair Mobility    Modified Rankin (Stroke Patients Only)       Balance Overall balance assessment: Needs assistance Sitting-balance support: No upper extremity supported Sitting balance-Leahy Scale: Normal     Standing balance support: No upper extremity supported Standing balance-Leahy Scale: Fair Standing balance comment: Pt able to balance without UE support in wide stance. Utilizes support to achieve narrow stance but able to maintain without UE support once in position. Negative [place]. Single leg balance is 1-2 seconds  Pertinent Vitals/Pain Pain Assessment: No/denies pain    Home Living Family/patient expects to be discharged to:: Private residence Living Arrangements: Children;Other (Comment) (2 adult) Available Help at Discharge: Family Type of Home: Apartment Home Access: Stairs to enter Entrance  Stairs-Rails: Right Entrance Stairs-Number of Steps: 8 Home Layout: One level Home Equipment: Walker - 2 wheels;Cane - quad Additional Comments: no shower seat, BSC, or grab bars    Prior Function Level of Independence: Independent with assistive device(s);Needs assistance   Gait / Transfers Assistance Needed: Patient reports after her CVA in Feb, she ambulated with a walker but now has graduated to a quad cane.   ADL's / Homemaking Assistance Needed: 2 olders sons who help with meal prep, light housekeeping skills.  Comments: Patient relies on her boyfriend or his parents to drive her to appointments or the store when she needs to go.      Hand Dominance   Dominant Hand: Right    Extremity/Trunk Assessment   Upper Extremity Assessment Upper Extremity Assessment: Defer to OT evaluation RUE Deficits / Details: Prior CVA, ROM WFL, strength 3+/5 overall RUE, some difficulty with manipulation of small objects such as buttons.  LUE Deficits / Details: ROM WFLs, strength 3/5 overall LUE    Lower Extremity Assessment Lower Extremity Assessment: RLE deficits/detail RLE Deficits / Details: 3+/5 R hip flexion, 4+/5 R knee extension, 4-/5 R knee flexion, 4+/5 R ankle DF. Reports fully intact sensation to light touch throughout LEs. LLE strength and sensation appear grossly WFL. At least 4+/5 throughout    Cervical / Trunk Assessment Cervical / Trunk Assessment: Normal  Communication   Communication: No difficulties  Cognition Arousal/Alertness: Lethargic Behavior During Therapy: WFL for tasks assessed/performed Overall Cognitive Status: Within Functional Limits for tasks assessed                                 General Comments: Pt lethargic during evaluation and had to be aroused multiple times to stay awake during evaluation.      General Comments      Exercises     Assessment/Plan    PT Assessment Patient needs continued PT services  PT Problem List  Decreased strength;Decreased balance;Decreased mobility;Decreased knowledge of use of DME       PT Treatment Interventions DME instruction;Gait training;Stair training;Functional mobility training;Therapeutic activities;Therapeutic exercise;Balance training;Neuromuscular re-education;Patient/family education    PT Goals (Current goals can be found in the Care Plan section)  Acute Rehab PT Goals Patient Stated Goal: Return to prior function at home PT Goal Formulation: With patient Time For Goal Achievement: 03/06/17 Potential to Achieve Goals: Good    Frequency Min 2X/week   Barriers to discharge        Co-evaluation               AM-PAC PT "6 Clicks" Daily Activity  Outcome Measure Difficulty turning over in bed (including adjusting bedclothes, sheets and blankets)?: None Difficulty moving from lying on back to sitting on the side of the bed? : None Difficulty sitting down on and standing up from a chair with arms (e.g., wheelchair, bedside commode, etc,.)?: None Help needed moving to and from a bed to chair (including a wheelchair)?: None Help needed walking in hospital room?: A Little Help needed climbing 3-5 steps with a railing? : A Little 6 Click Score: 22    End of Session Equipment Utilized During Treatment: Gait belt Activity Tolerance: Patient tolerated treatment  well Patient left: in bed;with call bell/phone within reach;with bed alarm set Nurse Communication: Mobility status PT Visit Diagnosis: Unsteadiness on feet (R26.81);History of falling (Z91.81);Muscle weakness (generalized) (M62.81);Hemiplegia and hemiparesis Hemiplegia - Right/Left: Right Hemiplegia - dominant/non-dominant: Dominant Hemiplegia - caused by: Cerebral infarction    Time: 5789-7847 PT Time Calculation (min) (ACUTE ONLY): 17 min   Charges:   PT Evaluation $PT Eval Low Complexity: 1 Procedure     PT G Codes:   PT G-Codes **NOT FOR INPATIENT CLASS** Functional Assessment Tool  Used: AM-PAC 6 Clicks Basic Mobility Functional Limitation: Mobility: Walking and moving around Mobility: Walking and Moving Around Current Status (Q4128): At least 20 percent but less than 40 percent impaired, limited or restricted Mobility: Walking and Moving Around Goal Status [phone]): At least 1 percent but less than 20 percent impaired, limited or restricted    Phillips Grout PT, DPT    Huprich,Jason 02/20/2017, 4:31 PM

## 2017-02-20 NOTE — ED Provider Notes (Signed)
-----------------------------------------   12:45 AM on 02/20/2017 -----------------------------------------  CT abdomen and pelvis interpreted per Dr. Melanee Spry: Claire Mcdonald within the anterior left lower abdominal fat at the level  of the umbilicus -question injection site or contusion.    No acute abnormality within the abdomen or pelvis.   Updated patient of CT results. Awaiting urinalysis. Will ambulate patient with her cane to assess steadiness of gait.  ----------------------------------------- 12:58 AM on 02/20/2017 -----------------------------------------  Patient was very unsteady using her quad cane. Will discuss with hospitalist to evaluate patient in the emergency department for admission.   Paulette Blanch, MD 02/20/17 [phone]

## 2017-02-20 NOTE — Progress Notes (Signed)
Admitted for left-sided numbness and weakness, symptoms improved, patient has no further weakness or numbness in the left side. She also complaint admitting doctor that she has nausea and abdominal pain and poor by mouth intake so admitting doctor started on IV fluids. But she denies any nausea or vomiting or abdominal pain today, no diarrhea.  Physical examination: Alert, awake, oriented. Cardiovascular system: S1, S2 regular. Lungs: Clear to auscultation, no wheeze, no rales. Neurological exam: Alert, awake, oriented. Cranial nerves II through XII intact., intact. Sensations are intact. Patient DTRs 2+ bilaterally. Patient says he is slightly numb on the left side due to previous stroke.  Labs reviewed, and medicines reviewed.  Assessment and plan.; left leg weakness and numbness: Symptoms resolved, MRI of the brain, ultrasound of carotids, echocardiogram, neurology consult, patient is started on the diet. Get physical therapy consult. Order stroke orders.  #2 . essential hypertension: Controlled, Exline history of previous History of DVT: Continue therapy.  #4 Chronic Systolic Heart Failure. Continue Lasix, Stop IV Fluids. #5. Seizure Disorder: Continue Keppra. 6, abdominal pain, nausea: . patient's symptoms resolved, discontinue IV fluids  D/w Nurse [facility]ney. Time spent'35 min

## 2017-02-20 NOTE — Progress Notes (Addendum)
Chart reviewed, Pt visited. Speech clear and fluent. No reports of dysphagia from Pt. St observed Pt with a few sips of H20. Nsg reports Pt passed swallow screen earlier today. No assessment indicated at this time. Please reconsult if any further concerns.No s/s of aspiration.

## 2017-02-20 NOTE — Progress Notes (Signed)
Nutrition Brief Note  Patient identified on the Malnutrition Screening Tool (MST) Report  Wt Readings from Last 15 Encounters:  02/20/17 220 lb 11.2 oz (100.1 kg)  11/20/15 214 lb (97.1 kg)  10/01/15 203 lb (92.1 kg)  09/21/15 205 lb 9.6 oz (93.3 kg)  09/12/15 205 lb (93 kg)  08/03/15 204 lb 12.8 oz (92.9 kg)  07/20/15 207 lb (93.9 kg)  07/16/15 207 lb (93.9 kg)  07/16/15 207 lb 9.6 oz (94.2 kg)  07/01/15 205 lb 6.4 oz (93.2 kg)  05/14/15 204 lb 12.8 oz (92.9 kg)  05/04/15 206 lb (93.4 kg)  05/01/15 206 lb 9.6 oz (93.7 kg)  04/16/15 212 lb 6.4 oz (96.3 kg)  04/12/15 217 lb 11.2 oz (98.7 kg)   Spoke with patient at bedside. She reports her appetite is good. She eats 3 meals per day at home and finishes 100% of meals. She reports that she watches what she eats. She limits her sodium intake and focuses on eating plenty of fruits and vegetables and drinking water. As patient reports she has not had formal education on heart healthy diet, RD offered to educate patient. She refuses education at this time reporting she knows what to do. Briefly discussed main points of diet and encouraged her to ask for RD if she would like education.   Patient reports UBW is 211-215 lbs and she has been weight stable. Per chart patient has gained some weight - likely due to fluid retention.  Nutrition-Focused physical exam completed. Findings are no fat depletion, no muscle depletion, and mild edema.   Patient is requesting the Carbohydrate Modified portion of diet be removed as she has no hx of diabetes.   Body mass index is 41.7 kg/m. Patient meets criteria for Obesity Class III based on current BMI.   Current diet order is Heart Healthy/Carbohydrate Modified, patient is consuming approximately 100% of meals at this time (per report - not recorded in chart). Labs and medications reviewed.   No nutrition interventions warranted at this time. If nutrition issues arise, please consult RD.   Willey Blade, MS, RD, LDN Pager: [phone] After Hours Pager: [phone]

## 2017-02-20 NOTE — Evaluation (Signed)
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  She reports she was at home with her sons and started to not feel well, numbness and tingling on her left side and reports her mouth was "twisted".  She called EMS and was transported to [facility] for further testing.     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  She reports she was at home with her sons and started to not feel well, numbness and tingling on her left side and reports her mouth was "twisted".  She called EMS and was transported to [facility] for further testing.  She lives in an apartment on the 1st floor with her 2 sons, ages 42 and 19 years old. Neither her nor her sons drive.  She reports she was ambulating with a quad cane at home and was able to complete her basic self care tasks.   Her sons assisted with meal prep and homemaking skills.  She was able to assist with meals and did the laundry.  She would like to return home with her kids. She would benefit from skilled OT to maximize her safety and independence in daily tasks to return home.      Follow Up Recommendations  No OT follow up    Equipment Recommendations       Recommendations for Other Services       Precautions / Restrictions Precautions Precautions: None Restrictions Weight Bearing Restrictions: No      Mobility Bed Mobility Overal bed mobility: Modified Independent                Transfers Overall transfer level: Modified independent Equipment used: Quad cane             General transfer comment: sit to stand with modified independence    Balance                                            ADL either performed or assessed with clinical judgement   ADL Overall ADL's : Needs assistance/impaired Eating/Feeding: Modified independent   Grooming: Modified independent   Upper Body Bathing: Modified independent   Lower Body Bathing: Set up;Minimal assistance Lower Body Bathing Details (indicate cue type and reason): difficulty reaching to feet Upper Body Dressing : Modified independent   Lower Body Dressing: Set up;Min guard Lower Body Dressing Details (indicate cue type and reason): increased time and effort to don socks bilaterally, edema present Toilet Transfer: Supervision/safety                   Vision         Perception     Praxis      Pertinent Vitals/Pain Pain Assessment: No/denies pain     Hand Dominance Right   Extremity/Trunk Assessment Upper Extremity Assessment Upper Extremity Assessment: Generalized weakness;LUE deficits/detail;RUE deficits/detail RUE Deficits / Details: Prior CVA, ROM WFL, strength 3+/5 overall RUE, some difficulty with manipulation of small objects such as buttons.  LUE Deficits / Details: ROM WFLs, strength  3/5 overall LUE   Lower Extremity Assessment Lower Extremity Assessment: Defer to PT evaluation   Cervical / Trunk Assessment Cervical / Trunk Assessment: Normal   Communication Communication Communication: No difficulties   Cognition Arousal/Alertness: Lethargic Behavior During Therapy: WFL for tasks assessed/performed Overall Cognitive Status: Within Functional Limits for tasks assessed                                 General Comments: Pt lethargic during evaluation and had to be aroused multiple times to stay awake during evaluation.   General Comments       Exercises     Shoulder Instructions      Home Living Family/patient expects to be discharged to:: Private residence Living Arrangements: Children Available Help at Discharge: Family Type of Home:  Apartment Home Access: Stairs to enter Technical brewer of Steps: 8 Entrance Stairs-Rails: Right Home Layout: One level     Bathroom Shower/Tub: Occupational psychologist: [place]: Environmental consultant - 2 wheels;Cane - quad          Prior Functioning/Environment Level of Independence: Independent with assistive device(s);Needs assistance  Gait / Transfers Assistance Needed: Patient reports after her CVA in Feb, she ambulated with a walker but now has graduated to a quad cane.  ADL's / Homemaking Assistance Needed: 2 olders sons who help with meal prep, light housekeeping skills.   Comments: Patient relies on her boyfriend or his parents to drive her to appointments or the store when she needs to go.         OT Problem List: Decreased strength;Decreased activity tolerance      OT Treatment/Interventions: Self-care/ADL training;Therapeutic exercise;Neuromuscular education;Patient/family education    OT Goals(Current goals can be found in the care plan section) Acute Rehab OT Goals Patient Stated Goal: Patient wants to go back home with her sons and do as much as she can.  OT Goal Formulation: With patient Time For Goal Achievement: 02/27/17 Potential to Achieve Goals: Good  OT Frequency: Min 1X/week   Barriers to D/C:            Co-evaluation              AM-PAC PT "6 Clicks" Daily Activity     Outcome Measure Help from another person eating meals?: None Help from another person taking care of personal grooming?: None Help from another person toileting, which includes using toliet, bedpan, or urinal?: A Little Help from another person bathing (including washing, rinsing, drying)?: A Little Help from another person to put on and taking off regular upper body clothing?: None Help from another person to put on and taking off regular lower body clothing?: A Little 6 Click Score: 21   End of Session Equipment Utilized During Treatment: Gait  belt  Activity Tolerance: Patient tolerated treatment well Patient left: in bed;with call bell/phone within reach;with bed alarm set  OT Visit Diagnosis: Muscle weakness (generalized) (M62.81)                Time: 5573-2202 OT Time Calculation (min): 23 min Charges:  OT General Charges $OT Visit: 1 Procedure OT Evaluation $OT Eval Low Complexity: 1 Procedure OT Treatments $Self Care/Home Management : 8-22 mins G-Codes:     Selso Mannor T Ladawna Walgren, OTR/L, CLT   Aceton Kinnear 02/20/2017, 3:30 PM

## 2017-02-20 NOTE — H&P (Signed)
Claire Mcdonald is an 51 y.o. female.   Chief Complaint: Weakness HPI: The patient with past medical history of right-sided stroke as well as systolic CHF and coronary artery disease presents to the emergency department complaining of left-sided weakness. The patient also reports numbness in her left leg. She has not fallen or injured her back. The patient also reports some abdominal pain and diarrhea. She has not been eating well due to nausea. Due to her risk factors emergency department staff called the hospitalist service for further evaluation.  Past Medical History:  Diagnosis Date  . Allergy   . Anxiety   . Coronary artery disease   . DDD (degenerative disc disease), cervical   . Depression   . Diverticulosis    seen on CT 2015  . DVT (deep venous thrombosis) ([place])   . GERD (gastroesophageal reflux disease)   . Heart failure ([place])   . High cholesterol   . Hyperlipidemia   . Hypertension   . Idiopathic cardiac arrest ([place]) 02/2015   V. fib, EF nl, non-obs CAD; [employer] ICD  . Migraine   . Obesity (BMI 30-39.9)   . Seizures ([place])   . Ventricular tachycardia [facility])     Past Surgical History:  Procedure Laterality Date  . CARDIAC CATHETERIZATION N/A 03/27/2015   Procedure: Left Heart Cath and Coronary Angiography;  Surgeon: Troy Sine, MD;  LAD 10%/30%, CFX and RCA OK, EF normal  . CENTRAL LINE  03/25/2015      . COLONOSCOPY WITH PROPOFOL N/A 10/01/2015   Procedure: COLONOSCOPY WITH PROPOFOL;  Surgeon: Josefine Class, MD;  Location: [facility] ENDOSCOPY;  Service: Endoscopy;  Laterality: N/A;  . EP IMPLANTABLE DEVICE N/A 04/11/2015   Procedure: ICD Implant;  Surgeon: Deboraha Sprang, MD;  [facility] ICD, serial number [id number]  . EP IMPLANTABLE DEVICE N/A 07/16/2015   Procedure: Lead Revision/Repair;  Surgeon: Will Meredith Leeds, MD;  Location: [facility] CV LAB;  Service: Cardiovascular;  Laterality: N/A;  . ESOPHAGOGASTRODUODENOSCOPY (EGD) WITH PROPOFOL N/A 10/01/2015   Procedure:  ESOPHAGOGASTRODUODENOSCOPY (EGD) WITH PROPOFOL;  Surgeon: Josefine Class, MD;  Location: [facility] ENDOSCOPY;  Service: Endoscopy;  Laterality: N/A;  . HAND SURGERY Left   . OVARIAN CYST REMOVAL Left   . TUBAL LIGATION  1998    Family History  Problem Relation Age of Onset  . Diabetes Father   . Asthma Father   . Hyperlipidemia Father   . Hypertension Father   . Kidney disease Father   . Asthma Mother   . Depression Mother   . Hyperlipidemia Mother   . Hypertension Mother   . Cancer Maternal Grandmother   . Early death Maternal Grandmother   . Early death Paternal Grandmother   . Allergies Daughter   . Allergies Son   . Allergies Son   . Heart disease Son   . Stroke Neg Hx    Social History:  reports that she has never smoked. She has never used smokeless tobacco. She reports that she does not drink alcohol or use drugs.  Allergies:  Allergies  Allergen Reactions  . Iodinated Diagnostic Agents Rash    On face   . Toradol [Ketorolac Tromethamine] Itching and Rash  . Tramadol Itching and Rash  . Zofran [Ondansetron Hcl] Itching and Rash    Medications Prior to Admission  Medication Sig Dispense Refill  . albuterol (PROVENTIL HFA;VENTOLIN HFA) 108 (90 BASE) MCG/ACT inhaler Inhale 2 puffs into the lungs every 6 (six) hours as needed  for wheezing or shortness of breath. 1 Inhaler 0  . apixaban (ELIQUIS) 5 MG TABS tablet Take 1 tablet (5 mg total) by mouth 2 (two) times daily. 60 tablet 12  . atorvastatin (LIPITOR) 20 MG tablet Take 1 tablet (20 mg total) by mouth daily at 6 PM. 90 tablet 1  . cyclobenzaprine (FLEXERIL) 10 MG tablet Take 10 mg by mouth 3 (three) times daily as needed for muscle spasms.    Marland Kitchen dicyclomine (BENTYL) 20 MG tablet Take 1 tablet (20 mg total) by mouth 3 (three) times daily as needed for spasms. 30 tablet 0  . DULoxetine (CYMBALTA) 20 MG capsule Take 20 mg by mouth daily. Reported on 11/20/2015    . DULoxetine (CYMBALTA) 60 MG capsule Take 60 mg by  mouth daily. Reported on 11/20/2015    . furosemide (LASIX) 20 MG tablet Take 20 mg by mouth daily and may take an additional daily as directed 90 tablet 3  . hyoscyamine (LEVSIN SL) 0.125 MG SL tablet Place 0.125 mg under the tongue every 4 (four) hours as needed. Reported on 11/20/2015    . levETIRAcetam (KEPPRA) 500 MG tablet Take 1 tablet (500 mg total) by mouth 2 (two) times daily. 60 tablet 2  . metoprolol tartrate (LOPRESSOR) 25 MG tablet Take 0.5 tablets (12.5 mg total) by mouth 2 (two) times daily. 60 tablet 1  . omeprazole (PRILOSEC) 20 MG capsule Take 20 mg by mouth daily.    . promethazine (PHENERGAN) 12.5 MG suppository Place 1 suppository (12.5 mg total) rectally every 6 (six) hours as needed for nausea or vomiting. (Patient not taking: Reported on 11/20/2015) 12 each 0  . sertraline (ZOLOFT) 100 MG tablet Take 200 mg by mouth daily.    Marland Kitchen topiramate (TOPAMAX) 100 MG tablet Take 1 tablet (100 mg total) by mouth 2 (two) times daily. 60 tablet 1  . traZODone (DESYREL) 50 MG tablet Take 50 mg by mouth at bedtime as needed for sleep.      Results for orders placed or performed during the hospital encounter of 02/19/17 (from the past 48 hour(s))  Basic metabolic panel     Status: Abnormal   Collection Time: 02/19/17  6:10 PM  Result Value Ref Range   Sodium 136 135 - 145 mmol/L   Potassium 3.7 3.5 - 5.1 mmol/L   Chloride 103 101 - 111 mmol/L   CO2 26 22 - 32 mmol/L   Glucose, Bld 106 (H) 65 - 99 mg/dL   BUN 21 (H) 6 - 20 mg/dL   Creatinine, Ser 0.70 0.44 - 1.00 mg/dL   Calcium 9.5 8.9 - 10.3 mg/dL   GFR calc non Af Amer >60 >60 mL/min   GFR calc Af Amer >60 >60 mL/min    Comment: (NOTE) The eGFR has been calculated using the CKD EPI equation. This calculation has not been validated in all clinical situations. eGFR's persistently <60 mL/min signify possible Chronic Kidney Disease.    Anion gap 7 5 - 15  CBC     Status: Abnormal   Collection Time: 02/19/17  6:10 PM  Result Value  Ref Range   WBC 8.5 3.6 - 11.0 K/uL   RBC 5.02 3.80 - 5.20 MIL/uL   Hemoglobin 14.6 12.0 - 16.0 g/dL   HCT 43.8 35.0 - 47.0 %   MCV 87.4 80.0 - 100.0 fL   MCH 29.0 26.0 - 34.0 pg   MCHC 33.2 32.0 - 36.0 g/dL   RDW 15.0 (H) 11.5 - 14.5 %  Platelets 274 150 - 440 K/uL  Troponin I     Status: None   Collection Time: 02/19/17  6:10 PM  Result Value Ref Range   Troponin I <0.03 <0.03 ng/mL  Lipase, blood     Status: None   Collection Time: 02/19/17  6:10 PM  Result Value Ref Range   Lipase 50 11 - 51 U/L  Ethanol     Status: None   Collection Time: 02/19/17  6:10 PM  Result Value Ref Range   Alcohol, Ethyl (B) <5 <5 mg/dL    Comment:        LOWEST DETECTABLE LIMIT FOR SERUM ALCOHOL IS 5 mg/dL FOR MEDICAL PURPOSES ONLY   Hepatic function panel     Status: Abnormal   Collection Time: 02/19/17  6:10 PM  Result Value Ref Range   Total Protein 8.1 6.5 - 8.1 g/dL   Albumin 4.2 3.5 - 5.0 g/dL   AST 24 15 - 41 U/L   ALT 16 14 - 54 U/L   Alkaline Phosphatase 76 38 - 126 U/L   Total Bilirubin 0.5 0.3 - 1.2 mg/dL   Bilirubin, Direct <0.1 (L) 0.1 - 0.5 mg/dL   Indirect Bilirubin NOT CALCULATED 0.3 - 0.9 mg/dL  Magnesium     Status: None   Collection Time: 02/19/17  6:10 PM  Result Value Ref Range   Magnesium 2.1 1.7 - 2.4 mg/dL  Phosphorus     Status: None   Collection Time: 02/19/17  6:10 PM  Result Value Ref Range   Phosphorus 3.8 2.5 - 4.6 mg/dL  TSH     Status: None   Collection Time: 02/19/17  6:10 PM  Result Value Ref Range   TSH 1.284 0.350 - 4.500 uIU/mL    Comment: Performed by a 3rd Generation assay with a functional sensitivity of <=0.01 uIU/mL.  Urinalysis, Complete w Microscopic     Status: Abnormal   Collection Time: 02/20/17  1:01 AM  Result Value Ref Range   Color, Urine YELLOW (A) YELLOW   APPearance HAZY (A) CLEAR   Specific Gravity, Urine 1.014 1.005 - 1.030   pH 5.0 5.0 - 8.0   Glucose, UA NEGATIVE NEGATIVE mg/dL   Hgb urine dipstick LARGE (A) NEGATIVE    Bilirubin Urine NEGATIVE NEGATIVE   Ketones, ur NEGATIVE NEGATIVE mg/dL   Protein, ur NEGATIVE NEGATIVE mg/dL   Nitrite NEGATIVE NEGATIVE   Leukocytes, UA NEGATIVE NEGATIVE   RBC / HPF TOO NUMEROUS TO COUNT 0 - 5 RBC/hpf   WBC, UA 0-5 0 - 5 WBC/hpf   Bacteria, UA RARE (A) NONE SEEN   Squamous Epithelial / LPF 0-5 (A) NONE SEEN   Mucous PRESENT    Ct Abdomen Pelvis Wo Contrast  Result Date: 02/19/2017 CLINICAL DATA:  51 year old female with acute abdominal pain and vomiting. EXAM: CT ABDOMEN AND PELVIS WITHOUT CONTRAST TECHNIQUE: Multidetector CT imaging of the abdomen and pelvis was performed following the standard protocol without IV contrast. COMPARISON:  07/20/2015 CT FINDINGS: Please note that parenchymal abnormalities may be missed without intravenous contrast. Lower chest: No acute abnormality Hepatobiliary: The liver and gallbladder are unremarkable. No biliary dilatation. Pancreas: Unremarkable Spleen: Unremarkable Adrenals/Urinary Tract: The kidneys, adrenal glands and bladder are unremarkable. Stomach/Bowel: Stomach is within normal limits. No evidence of bowel wall thickening, distention, or inflammatory changes. Vascular/Lymphatic: No significant vascular findings are present. No enlarged abdominal or pelvic lymph nodes. Reproductive: Uterus and bilateral adnexa are unremarkable. Other: Anterior abdominal wall surgical changes noted. Stranding in  the anterior left lower abdominal fat at the level the umbilicus may represent injection site or contusion. No ascites, abscess or pneumoperitoneum. Musculoskeletal: No acute abnormality. Bilateral L5 pars defects and grade 2 anterolisthesis of L5 on S1 again noted. IMPRESSION: Stranding within the anterior left lower abdominal fat at the level of the umbilicus -question injection site or contusion. No acute abnormality within the abdomen or pelvis. Electronically Signed   By: Margarette Canada M.D.   On: 02/19/2017 23:39   Dg Chest 2 View  Result  Date: 02/19/2017 CLINICAL DATA:  Bilateral upper extremity numbness and weakness since last night. EXAM: CHEST  2 VIEW COMPARISON:  09/21/2015. FINDINGS: Normal sized heart. Clear lungs with normal vascularity. Minimal thoracic spine degenerative changes. Stable left subclavian AICD lead. IMPRESSION: No acute abnormality. Electronically Signed   By: Claudie Revering M.D.   On: 02/19/2017 18:58   Ct Head Wo Contrast  Result Date: 02/19/2017 CLINICAL DATA:  Bilateral upper extremity numbness and weakness since last night. Chronic right-sided deficits from a previous stroke, worse tonight. EXAM: CT HEAD WITHOUT CONTRAST TECHNIQUE: Contiguous axial images were obtained from the base of the skull through the vertex without intravenous contrast. COMPARISON:  04/11/2015. FINDINGS: Brain: Progressive enlargement of the ventricles and subarachnoid spaces. Interval old left corona radiata and external capsule infarct. Interval old right cerebellar hemisphere infarct. No intracranial hemorrhage, mass lesion or CT evidence of acute infarction. Vascular: No hyperdense vessel or unexpected calcification. Skull: Normal. Negative for fracture or focal lesion. Sinuses/Orbits: Unremarkable. Other: None. IMPRESSION: 1. No acute abnormality. 2. Mildly progressive diffuse cerebral atrophy. 3. Interval old left cerebral and right cerebellar infarcts, as described above. Electronically Signed   By: Claudie Revering M.D.   On: 02/19/2017 19:41    Review of Systems  Constitutional: Negative for chills and fever.  HENT: Negative for sore throat and tinnitus.   Eyes: Negative for blurred vision and redness.  Respiratory: Negative for cough and shortness of breath.   Cardiovascular: Negative for chest pain, palpitations, orthopnea and PND.  Gastrointestinal: Positive for abdominal pain and diarrhea. Negative for nausea and vomiting.  Genitourinary: Negative for dysuria, frequency and urgency.  Musculoskeletal: Negative for joint pain  and myalgias.  Skin: Negative for rash.       No lesions  Neurological: Positive for focal weakness. Negative for speech change and weakness.  Endo/Heme/Allergies: Does not bruise/bleed easily.       No temperature intolerance  Psychiatric/Behavioral: Negative for depression and suicidal ideas.    Blood pressure 134/80, pulse 68, temperature 97.9 F (36.6 C), temperature source Oral, resp. rate 16, height '5\' 1"'  (1.549 m), weight 100.1 kg (220 lb 11.2 oz), last menstrual period 02/19/2017, SpO2 95 %. Physical Exam  Vitals reviewed. Constitutional: She is oriented to person, place, and time. She appears well-developed and well-nourished. No distress.  HENT:  Head: Normocephalic and atraumatic.  Mouth/Throat: Oropharynx is clear and moist.  Eyes: Conjunctivae and EOM are normal. Pupils are equal, round, and reactive to light. No scleral icterus.  Neck: Normal range of motion. Neck supple. No JVD present. No tracheal deviation present. No thyromegaly present.  Cardiovascular: Normal rate, regular rhythm and normal heart sounds.  Exam reveals no gallop and no friction rub.   No murmur heard. Respiratory: Effort normal and breath sounds normal.  GI: Soft. Bowel sounds are normal. She exhibits no distension. There is no tenderness.  Genitourinary:  Genitourinary Comments: Deferred  Musculoskeletal: Normal range of motion. She exhibits edema.  Lymphadenopathy:  She has no cervical adenopathy.  Neurological: She is alert and oriented to person, place, and time. No cranial nerve deficit. She exhibits normal muscle tone.  Skin: Skin is warm and dry. No rash noted. No erythema.  Psychiatric: She has a normal mood and affect. Her behavior is normal. Judgment and thought content normal.     Assessment/Plan This is a 51 year old female admitted for left-sided weakness. 1. Weakness: Left arm and leg with associated numbness. The patient states that both are improving at this time. No neurologic  deficits on physical exam. I have ordered a carotid ultrasound but deferred echocardiogram for now. Consult neurology for further guidance. 2. CAD: Stable; AICD in place (history of V. fib arrest). 3. Essential hypertension: Controlled; continue metoprolol 4. History of TIA: Right-sided. The patient is on Eliquis for history of DVT. Continue Lipitor for plaque stabilization 5. Abdominal pain: With diarrhea; continue Bentyl and hyoscyamine per home regimen. 6. CHF: Systolic; continue Lasix per home regimen. Currently the patient is intravascularly dry but may need an increased dose of maintenance Lasix given significant swelling of lower extremities. 7. Seizure disorder: Continue Keppra 8. Migraine disorder: Continue Topamax 9. Mood disorder: Continue Zoloft, Cymbalta and trazodone 10. DVT prophylaxis: As above 11. GI prophylaxis: Pantoprazole The patient is a full code. Time spent on admission orders and patient care approximately 45 minutes  Harrie Foreman, MD 02/20/2017, 8:22 AM

## 2017-02-20 NOTE — Consult Note (Signed)
Reason for Consult: L sided weakness  Referring Physician: Dr. Vianne Bulls   CC: L sided weakness   HPI: Claire Mcdonald is an 52 y.o. female with hx of right-sided stroke as well as systolic CHF,AICD, and coronary artery disease presents to the emergency department complaining of left-sided weakness. The patient also reports numbness in her left leg. Currently improved and back to baseline.  Pt is on eliquis at home which she is compliant with .  AICD is MRI compatible.    Past Medical History:  Diagnosis Date  . Allergy   . Anxiety   . Coronary artery disease   . DDD (degenerative disc disease), cervical   . Depression   . Diverticulosis    seen on CT 2015  . DVT (deep venous thrombosis) ([place])   . GERD (gastroesophageal reflux disease)   . Heart failure ([place])   . High cholesterol   . Hyperlipidemia   . Hypertension   . Idiopathic cardiac arrest ([place]) 02/2015   V. fib, EF nl, non-obs CAD; [employer] ICD  . Migraine   . Obesity (BMI 30-39.9)   . Seizures ([place])   . Ventricular tachycardia [facility])     Past Surgical History:  Procedure Laterality Date  . CARDIAC CATHETERIZATION N/A 03/27/2015   Procedure: Left Heart Cath and Coronary Angiography;  Surgeon: Troy Sine, MD;  LAD 10%/30%, CFX and RCA OK, EF normal  . CENTRAL LINE  03/25/2015      . COLONOSCOPY WITH PROPOFOL N/A 10/01/2015   Procedure: COLONOSCOPY WITH PROPOFOL;  Surgeon: Josefine Class, MD;  Location: [facility] ENDOSCOPY;  Service: Endoscopy;  Laterality: N/A;  . EP IMPLANTABLE DEVICE N/A 04/11/2015   Procedure: ICD Implant;  Surgeon: Deboraha Sprang, MD;  [facility] ICD, serial number [id number]  . EP IMPLANTABLE DEVICE N/A 07/16/2015   Procedure: Lead Revision/Repair;  Surgeon: Will Meredith Leeds, MD;  Location: [facility];  Service: Cardiovascular;  Laterality: N/A;  . ESOPHAGOGASTRODUODENOSCOPY (EGD) WITH PROPOFOL N/A 10/01/2015   Procedure: ESOPHAGOGASTRODUODENOSCOPY (EGD) WITH PROPOFOL;  Surgeon: Josefine Class,  MD;  Location: [facility] ENDOSCOPY;  Service: Endoscopy;  Laterality: N/A;  . HAND SURGERY Left   . OVARIAN CYST REMOVAL Left   . TUBAL LIGATION  1998    Family History  Problem Relation Age of Onset  . Diabetes Father   . Asthma Father   . Hyperlipidemia Father   . Hypertension Father   . Kidney disease Father   . Asthma Mother   . Depression Mother   . Hyperlipidemia Mother   . Hypertension Mother   . Cancer Maternal Grandmother   . Early death Maternal Grandmother   . Early death Paternal Grandmother   . Allergies Daughter   . Allergies Son   . Allergies Son   . Heart disease Son   . Stroke Neg Hx     Social History:  reports that she has never smoked. She has never used smokeless tobacco. She reports that she does not drink alcohol or use drugs.  Allergies  Allergen Reactions  . Iodinated Diagnostic Agents Rash    On face   . Toradol [Ketorolac Tromethamine] Itching and Rash  . Tramadol Itching and Rash  . Zofran [Ondansetron Hcl] Itching and Rash    Medications: I have reviewed the patient's current medications.  ROS: History obtained from the patient  General ROS: negative for - chills, fatigue, fever, night sweats, weight gain or weight loss Psychological ROS: negative for - behavioral disorder,  hallucinations, memory difficulties, mood swings or suicidal ideation Ophthalmic ROS: negative for - blurry vision, double vision, eye pain or loss of vision ENT ROS: negative for - epistaxis, nasal discharge, oral lesions, sore throat, tinnitus or vertigo Allergy and Immunology ROS: negative for - hives or itchy/watery eyes Hematological and Lymphatic ROS: negative for - bleeding problems, bruising or swollen lymph nodes Endocrine ROS: negative for - galactorrhea, hair pattern changes, polydipsia/polyuria or temperature intolerance Respiratory ROS: negative for - cough, hemoptysis, shortness of breath or wheezing Cardiovascular ROS: negative for - chest pain, dyspnea on  exertion, edema or irregular heartbeat Gastrointestinal ROS: negative for - abdominal pain, diarrhea, hematemesis, nausea/vomiting or stool incontinence Genito-Urinary ROS: negative for - dysuria, hematuria, incontinence or urinary frequency/urgency Musculoskeletal ROS: negative for - joint swelling or muscular weakness Neurological ROS: as noted in HPI Dermatological ROS: negative for rash and skin lesion changes  Physical Examination: Blood pressure 134/80, pulse 68, temperature 97.9 F (36.6 C), temperature source Oral, resp. rate 16, height 5\' 1"  (1.549 m), weight 100.1 kg (220 lb 11.2 oz), last menstrual period 02/19/2017, SpO2 95 %.   Neurological Examination   Mental Status: Alert, oriented, thought content appropriate.  Speech fluent without evidence of aphasia.  Able to follow 3 step commands without difficulty. Cranial Nerves: II: Discs flat bilaterally; Visual fields grossly normal, pupils equal, round, reactive to light and accommodation III,IV, VI: ptosis not present, extra-ocular motions intact bilaterally V,VII: smile symmetric, facial light touch sensation normal bilaterally VIII: hearing normal bilaterally IX,X: gag reflex present XI: bilateral shoulder shrug XII: midline tongue extension Motor: Right : Upper extremity   5/5    Left:     Upper extremity   5/5  Lower extremity   4/5     Lower extremity   5/5 Tone and bulk:normal tone throughout; no atrophy noted Sensory: decreased on R side which is chronic.  Deep Tendon Reflexes: 1+ and symmetric throughout Plantars: Right: downgoing   Left: downgoing Cerebellar: normal finger-to-nose, normal rapid alternating movements and normal heel-to-shin test Gait: not tested      Laboratory Studies:   Basic Metabolic Panel:  Recent Labs Lab 02/19/17 1810  NA 136  K 3.7  CL 103  CO2 26  GLUCOSE 106*  BUN 21*  CREATININE 0.70  CALCIUM 9.5  MG 2.1  PHOS 3.8    Liver Function Tests:  Recent Labs Lab  02/19/17 1810  AST 24  ALT 16  ALKPHOS 76  BILITOT 0.5  PROT 8.1  ALBUMIN 4.2    Recent Labs Lab 02/19/17 1810  LIPASE 50   No results for input(s): AMMONIA in the last 168 hours.  CBC:  Recent Labs Lab 02/19/17 1810  WBC 8.5  HGB 14.6  HCT 43.8  MCV 87.4  PLT 274    Cardiac Enzymes:  Recent Labs Lab 02/19/17 1810  TROPONINI <0.03    BNP: Invalid input(s): POCBNP  CBG: No results for input(s): GLUCAP in the last 168 hours.  Microbiology: Results for orders placed or performed during the hospital encounter of 07/16/15  Surgical pcr screen     Status: None   Collection Time: 07/16/15 12:31 PM  Result Value Ref Range Status   MRSA, PCR NEGATIVE NEGATIVE Final   Staphylococcus aureus NEGATIVE NEGATIVE Final    Comment:        The Xpert SA Assay (FDA approved for NASAL specimens in patients over 3 years of age), is one component of a comprehensive surveillance program.  Test performance has  been validated by [facility] for patients greater than or equal to 61 year old. It is not intended to diagnose infection nor to guide or monitor treatment.     Coagulation Studies: No results for input(s): LABPROT, INR in the last 72 hours.  Urinalysis:  Recent Labs Lab 02/20/17 0101  COLORURINE YELLOW*  LABSPEC 1.014  PHURINE 5.0  GLUCOSEU NEGATIVE  HGBUR LARGE*  BILIRUBINUR NEGATIVE  KETONESUR NEGATIVE  PROTEINUR NEGATIVE  NITRITE NEGATIVE  LEUKOCYTESUR NEGATIVE    Lipid Panel:     Component Value Date/Time   CHOL 163 04/16/2015 1420   TRIG 102 04/16/2015 1420   VLDL 20 04/16/2015 1420    HgbA1C: No results found for: HGBA1C  Urine Drug Screen:     Component Value Date/Time   LABOPIA NONE DETECTED 03/25/2015 1617   COCAINSCRNUR NONE DETECTED 03/25/2015 1617   LABBENZ POSITIVE (A) 03/25/2015 1617   AMPHETMU NONE DETECTED 03/25/2015 1617   THCU NONE DETECTED 03/25/2015 1617   LABBARB NONE DETECTED 03/25/2015 1617    Alcohol Level:   Recent Labs Lab 02/19/17 1810  ETH <5    Other results: EKG: normal EKG, normal sinus rhythm, unchanged from previous tracings.  Imaging: Ct Abdomen Pelvis Wo Contrast  Result Date: 02/19/2017 CLINICAL DATA:  51 year old female with acute abdominal pain and vomiting. EXAM: CT ABDOMEN AND PELVIS WITHOUT CONTRAST TECHNIQUE: Multidetector CT imaging of the abdomen and pelvis was performed following the standard protocol without IV contrast. COMPARISON:  07/20/2015 CT FINDINGS: Please note that parenchymal abnormalities may be missed without intravenous contrast. Lower chest: No acute abnormality Hepatobiliary: The liver and gallbladder are unremarkable. No biliary dilatation. Pancreas: Unremarkable Spleen: Unremarkable Adrenals/Urinary Tract: The kidneys, adrenal glands and bladder are unremarkable. Stomach/Bowel: Stomach is within normal limits. No evidence of bowel wall thickening, distention, or inflammatory changes. Vascular/Lymphatic: No significant vascular findings are present. No enlarged abdominal or pelvic lymph nodes. Reproductive: Uterus and bilateral adnexa are unremarkable. Other: Anterior abdominal wall surgical changes noted. Stranding in the anterior left lower abdominal fat at the level the umbilicus may represent injection site or contusion. No ascites, abscess or pneumoperitoneum. Musculoskeletal: No acute abnormality. Bilateral L5 pars defects and grade 2 anterolisthesis of L5 on S1 again noted. IMPRESSION: Stranding within the anterior left lower abdominal fat at the level of the umbilicus -question injection site or contusion. No acute abnormality within the abdomen or pelvis. Electronically Signed   By: Margarette Canada M.D.   On: 02/19/2017 23:39   Dg Chest 2 View  Result Date: 02/19/2017 CLINICAL DATA:  Bilateral upper extremity numbness and weakness since last night. EXAM: CHEST  2 VIEW COMPARISON:  09/21/2015. FINDINGS: Normal sized heart. Clear lungs with normal vascularity.  Minimal thoracic spine degenerative changes. Stable left subclavian AICD lead. IMPRESSION: No acute abnormality. Electronically Signed   By: Claudie Revering M.D.   On: 02/19/2017 18:58   Ct Head Wo Contrast  Result Date: 02/19/2017 CLINICAL DATA:  Bilateral upper extremity numbness and weakness since last night. Chronic right-sided deficits from a previous stroke, worse tonight. EXAM: CT HEAD WITHOUT CONTRAST TECHNIQUE: Contiguous axial images were obtained from the base of the skull through the vertex without intravenous contrast. COMPARISON:  04/11/2015. FINDINGS: Brain: Progressive enlargement of the ventricles and subarachnoid spaces. Interval old left corona radiata and external capsule infarct. Interval old right cerebellar hemisphere infarct. No intracranial hemorrhage, mass lesion or CT evidence of acute infarction. Vascular: No hyperdense vessel or unexpected calcification. Skull: Normal. Negative for fracture or focal  lesion. Sinuses/Orbits: Unremarkable. Other: None. IMPRESSION: 1. No acute abnormality. 2. Mildly progressive diffuse cerebral atrophy. 3. Interval old left cerebral and right cerebellar infarcts, as described above. Electronically Signed   By: Claudie Revering M.D.   On: 02/19/2017 19:41     Assessment/Plan:   51 y.o. female with hx of right-sided stroke as well as systolic CHF,AICD, and coronary artery disease presents to the emergency department complaining of left-sided weakness. The patient also reports numbness in her left leg. Currently improved and back to baseline.  Pt is on eliquis at home which she is compliant with .  AICD is MRI compatible.    CTH no acute abnormalities and pt is close to baseline - Agree with MRI as AICD is compatible and she had MRI in past - pt/ot - con't eiliquis - d/c planning [facility] Pain  02/20/2017, 10:56 AM

## 2017-02-20 NOTE — Progress Notes (Signed)
*  PRELIMINARY RESULTS* Echocardiogram 2D Echocardiogram has been performed.  Claire Mcdonald 02/20/2017, 12:37 PM

## 2017-02-21 LAB — LIPID PANEL
CHOLESTEROL: 164 mg/dL (ref 0–200)
HDL: 52 mg/dL (ref >40)
LDL Cholesterol: 90 mg/dL (ref 0–99)
Total CHOL/HDL Ratio: 3.2 RATIO
Triglycerides: 110 mg/dL (ref <150)
VLDL: 22 mg/dL (ref 0–40)

## 2017-02-21 LAB — HEMOGLOBIN A1C
Hgb A1c MFr Bld: 5.7 % — ABNORMAL HIGH (ref 4.8–5.6)
Mean Plasma Glucose: 117 mg/dL

## 2017-02-21 NOTE — Care Management Note (Signed)
Case Management Note  Patient Details  Name: Claire Mcdonald MRN: [id number] Date of Birth: 1966/01/05  Subjective/Objective:        Discussed OP-PT at the [facility] with Ms Irigoyen  Per Ms Whitehurst has Medicaid which will not pay for PT.  Provided Ms Sachs with a [facility] handout and suggested that she call them on Monday to schedule an appointment if she wants to participate in OP-PT.           Action/Plan:   Expected Discharge Date:  02/21/17               Expected Discharge Plan:     In-House Referral:     Discharge planning Services     Post Acute Care Choice:    Choice offered to:     DME Arranged:    DME Agency:     HH Arranged:    HH Agency:     Status of Service:     If discussed at [employer], dates discussed:    Additional Comments:  Andreia Gandolfi A, RN 02/21/2017, 9:48 AM

## 2017-02-21 NOTE — Progress Notes (Signed)
Pt being discharged home, discharge instructions reviewed with pt, states understanding, pt with no complaints 

## 2017-02-21 NOTE — Progress Notes (Signed)
Occupational Therapy Treatment Patient Details Name: DEANDA RUDDELL MRN: [id number] DOB: May 18, 1966 Today's Date: 02/21/2017    History of present illness Pt is a 51 yo female who reports she suffered a CVA on 10-17-16 with right sided weakness, she was discharged home and received home health PT.  She reports she was at home with her sons and started to not feel well, numbness and tingling on her left side and reports her mouth was "twisted".  She called EMS and was transported to [facility] for further testing. At time of PT evaluation pt reports return to baseline. She is still awaiting brain MRI.    OT comments  Pt seen for OT treatment session this morning. Pt educated in body positioning and home/routines modifications to maximize safety, manage fatigue/decreased strength, and maximize functional independence with self care and home management tasks at home. Pt identified vacuuming and washing dishes as two activities during the day that are important for her to complete independently but that are difficult for her due to decreased activity tolerance and strength. OT facilitated problem solving and educated pt on adaptive strategies to perform tasks from seated position and incorporating rest breaks to support safety and independence. Pt verbalized understanding of all education. Handout provided. Goals met. Pt plans to return home today, discharge in process.    Follow Up Recommendations  No OT follow up    Equipment Recommendations       Recommendations for Other Services      Precautions / Restrictions Precautions Precautions: None Restrictions Weight Bearing Restrictions: No       Mobility Bed Mobility Overal bed mobility: Independent             General bed mobility comments: HOB minimally elevated. No use of bed rails. Mild increase in time but able to perform without external assistance  Transfers                      Balance                                           ADL either performed or assessed with clinical judgement   ADL                                         General ADL Comments: Pt educated in AE for self care tasks as well as body positioning to maximize functional independence with ADL, pt verbalized understanding     Vision       Perception     Praxis      Cognition Arousal/Alertness: Awake/alert Behavior During Therapy: WFL for tasks assessed/performed Overall Cognitive Status: Within Functional Limits for tasks assessed                                          Exercises Other Exercises Other Exercises: Pt educated in home/routines modifications to maximize safety and functional independence with ADL and IADL including household mgt tasks, meal prep, and self care tasks; handout provided, pt verbalized understanding   Shoulder Instructions       General Comments      Pertinent Vitals/ Pain  Pain Assessment: No/denies pain  Home Living                                          Prior Functioning/Environment              Frequency  Min 1X/week        Progress Toward Goals  OT Goals(current goals can now be found in the care plan section)  Progress towards OT goals: Progressing toward goals  Acute Rehab OT Goals Patient Stated Goal: Return to prior function at home OT Goal Formulation: With patient Time For Goal Achievement: 02/27/17 Potential to Achieve Goals: Good  Plan Discharge plan remains appropriate;Frequency remains appropriate    Co-evaluation                 AM-PAC PT "6 Clicks" Daily Activity     Outcome Measure   Help from another person eating meals?: None Help from another person taking care of personal grooming?: None Help from another person toileting, which includes using toliet, bedpan, or urinal?: A Little Help from another person bathing (including washing, rinsing, drying)?: A Little Help  from another person to put on and taking off regular upper body clothing?: None Help from another person to put on and taking off regular lower body clothing?: A Little 6 Click Score: 21    End of Session    OT Visit Diagnosis: Muscle weakness (generalized) (M62.81)   Activity Tolerance Patient tolerated treatment well   Patient Left in bed;with call bell/phone within reach;with bed alarm set   Nurse Communication Other (comment) (notified CNA that OT session was completed)    Functional Assessment Tool Used: AM-PAC 6 Clicks Daily Activity;Clinical judgement Functional Limitation: Self care Self Care Current Status (Q1975): At least 20 percent but less than 40 percent impaired, limited or restricted Self Care Goal Status (O8325): At least 20 percent but less than 40 percent impaired, limited or restricted   Time: 4982-6415 OT Time Calculation (min): 10 min  Charges: OT G-codes **NOT FOR INPATIENT CLASS** Functional Assessment Tool Used: AM-PAC 6 Clicks Daily Activity;Clinical judgement Functional Limitation: Self care Self Care Current Status (A3094): At least 20 percent but less than 40 percent impaired, limited or restricted Self Care Goal Status (M7680): At least 20 percent but less than 40 percent impaired, limited or restricted OT General Charges $OT Visit: 1 Procedure OT Treatments $Self Care/Home Management : 8-22 mins  Jeni Salles, MPH, MS, OTR/L ascom [phone] 02/21/17, 10:13 AM

## 2017-02-22 LAB — HEMOGLOBIN A1C
HEMOGLOBIN A1C: 5.6 % (ref 4.8–5.6)
Mean Plasma Glucose: 114 mg/dL

## 2017-02-23 NOTE — Discharge Summary (Signed)
Claire Mcdonald, is a 51 y.o. female  DOB [birth year]  MRN [id number].  Admission date:  02/19/2017  Admitting Physician  Harrie Foreman, MD  Discharge Date:  02/21/2017   Primary MD  [name], NP  Recommendations for primary care physician for things to follow:   Follow up with primary doctor in 1 week   Admission Diagnosis  Paresthesias [R20.2] Generalized abdominal pain [R10.84] Unsteady gait [R26.81] Non-intractable vomiting with nausea, unspecified vomiting type [R11.2]   Discharge Diagnosis  Paresthesias [R20.2] Generalized abdominal pain [R10.84] Unsteady gait [R26.81] Non-intractable vomiting with nausea, unspecified vomiting type [R11.2]    Active Problems:   Left-sided weakness      Past Medical History:  Diagnosis Date  . Allergy   . Anxiety   . Coronary artery disease   . DDD (degenerative disc disease), cervical   . Depression   . Diverticulosis    seen on CT 2015  . DVT (deep venous thrombosis) ([place])   . GERD (gastroesophageal reflux disease)   . Heart failure ([place])   . High cholesterol   . Hyperlipidemia   . Hypertension   . Idiopathic cardiac arrest ([place]) 02/2015   V. fib, EF nl, non-obs CAD; [employer] ICD  . Migraine   . Obesity (BMI 30-39.9)   . Seizures ([place])   . Ventricular tachycardia [facility])     Past Surgical History:  Procedure Laterality Date  . CARDIAC CATHETERIZATION N/A 03/27/2015   Procedure: Left Heart Cath and Coronary Angiography;  Surgeon: Troy Sine, MD;  LAD 10%/30%, CFX and RCA OK, EF normal  . CENTRAL LINE  03/25/2015      . COLONOSCOPY WITH PROPOFOL N/A 10/01/2015   Procedure: COLONOSCOPY WITH PROPOFOL;  Surgeon: Josefine Class, MD;  Location: [facility] ENDOSCOPY;  Service: Endoscopy;  Laterality: N/A;  . EP IMPLANTABLE DEVICE N/A 04/11/2015   Procedure: ICD  Implant;  Surgeon: Deboraha Sprang, MD;  [facility] ICD, serial number [id number]  . EP IMPLANTABLE DEVICE N/A 07/16/2015   Procedure: Lead Revision/Repair;  Surgeon: Will Meredith Leeds, MD;  Location: [facility] CV LAB;  Service: Cardiovascular;  Laterality: N/A;  . ESOPHAGOGASTRODUODENOSCOPY (EGD) WITH PROPOFOL N/A 10/01/2015   Procedure: ESOPHAGOGASTRODUODENOSCOPY (EGD) WITH PROPOFOL;  Surgeon: Josefine Class, MD;  Location: [facility] ENDOSCOPY;  Service: Endoscopy;  Laterality: N/A;  . HAND SURGERY Left   . OVARIAN CYST REMOVAL Left   . TUBAL LIGATION  1998       History of present illness and  Hospital Course:     Kindly see H&P for history of present illness and admission details, please review complete Labs, Consult reports and Test reports for all details in brief  HPI  from the history and physical done on the day of admission patient with past medical history of right-sided stroke as well as systolic CHF and coronary artery disease presents to the emergency department complaining of left-sided weakness. The patient also reports numbness in her left leg. She has not fallen or injured her back. The patient also reports some abdominal pain and diarrhea. She has not been eating well due to nausea.   Hospital Course  left leg weakness and numbness: Symptoms resolved, MRI of the brain, ultrasound of carotids, echocardiogram, neurology consulted.  MRI was not done because of her AICD.eventhoughAICD is compatible for MRIs. He has a department did not do MRI. Carotid ultrasound, echocardiogram was within normal range. Patient symptoms of left-sided numbness and weakness completely resolved, seen by neurology.  Advised to continue her home dose Eliquis,statins. Did not have any physical therapy or occupational therapy needs at  discharge.  #2 . essential hypertension: Controlled, history of previous History of DVT: Continue eliquis.  #4 Chronic Systolic Heart Failure. Continue Lasix,   #5.  Seizure Disorder: Continue Keppra. 6, abdominal pain, nausea: . patient's symptoms resolved, received gentle hydration but stopped IV fluids after her symptoms improved.     Discharge Condition: stable   Follow UP  Follow-up Information    [name], NP Follow up in 1 week(s).   Specialties:  Nurse Practitioner, Family Medicine Contact information: [address] [place] [address] [phone]             Discharge Instructions  and  Discharge Medication   Allergies as of 02/21/2017      Reactions   Iodinated Diagnostic Agents Rash   On face    Toradol [ketorolac Tromethamine] Itching, Rash   Tramadol Itching, Rash   Zofran [ondansetron Hcl] Itching, Rash      Medication List    TAKE these medications   albuterol 108 (90 Base) MCG/ACT inhaler Commonly known as:  PROVENTIL HFA;VENTOLIN HFA Inhale 2 puffs into the lungs every 6 (six) hours as needed for wheezing or shortness of breath.   apixaban 5 MG Tabs tablet Commonly known as:  ELIQUIS Take 1 tablet (5 mg total) by mouth 2 (two) times daily.   atorvastatin 20 MG tablet Commonly known as:  LIPITOR Take 1 tablet (20 mg total) by mouth daily at 6 PM.   cyclobenzaprine 10 MG tablet Commonly known as:  FLEXERIL Take 10 mg by mouth 3 (three) times daily as needed for muscle spasms.   dicyclomine 20 MG tablet Commonly known as:  BENTYL Take 1 tablet (20 mg total) by mouth 3 (three) times daily as needed for spasms.   DULoxetine 60 MG capsule Commonly known as:  CYMBALTA Take 60 mg by mouth daily. Reported on 11/20/2015 What changed:  Another medication with the same name was removed. Continue taking this medication, and follow the directions you see here.   furosemide 20 MG tablet Commonly known as:  LASIX Take 20 mg by mouth daily and may take an additional daily as directed   hyoscyamine 0.125 MG SL tablet Commonly known as:  LEVSIN SL Place 0.125 mg under the tongue every 4 (four) hours as needed.  Reported on 11/20/2015   levETIRAcetam 500 MG tablet Commonly known as:  KEPPRA Take 1 tablet (500 mg total) by mouth 2 (two) times daily.   metoprolol tartrate 25 MG tablet Commonly known as:  LOPRESSOR Take 0.5 tablets (12.5 mg total) by mouth 2 (two) times daily.   omeprazole 20 MG capsule Commonly known as:  PRILOSEC Take 20 mg by mouth daily.   promethazine 12.5 MG suppository Commonly known as:  PHENERGAN Place 1 suppository (12.5 mg total) rectally every 6 (six) hours as needed for nausea or vomiting.   sertraline 100 MG tablet Commonly known as:  ZOLOFT Take 200 mg by mouth daily.   topiramate 100 MG tablet Commonly known as:  TOPAMAX Take 1 tablet (100 mg total) by mouth 2 (two) times daily.   traZODone 50 MG tablet Commonly known as:  DESYREL Take 50 mg by mouth at bedtime as needed for sleep.         Diet and Activity recommendation: See Discharge Instructions above   Consults obtained -neuro,PT,OT   Major procedures and Radiology Reports - PLEASE review detailed and final  reports for all details, in brief -     Ct Abdomen Pelvis Wo Contrast  Result Date: 02/19/2017 CLINICAL DATA:  51 year old female with acute abdominal pain and vomiting. EXAM: CT ABDOMEN AND PELVIS WITHOUT CONTRAST TECHNIQUE: Multidetector CT imaging of the abdomen and pelvis was performed following the standard protocol without IV contrast. COMPARISON:  07/20/2015 CT FINDINGS: Please note that parenchymal abnormalities may be missed without intravenous contrast. Lower chest: No acute abnormality Hepatobiliary: The liver and gallbladder are unremarkable. No biliary dilatation. Pancreas: Unremarkable Spleen: Unremarkable Adrenals/Urinary Tract: The kidneys, adrenal glands and bladder are unremarkable. Stomach/Bowel: Stomach is within normal limits. No evidence of bowel wall thickening, distention, or inflammatory changes. Vascular/Lymphatic: No significant vascular findings are present. No  enlarged abdominal or pelvic lymph nodes. Reproductive: Uterus and bilateral adnexa are unremarkable. Other: Anterior abdominal wall surgical changes noted. Stranding in the anterior left lower abdominal fat at the level the umbilicus may represent injection site or contusion. No ascites, abscess or pneumoperitoneum. Musculoskeletal: No acute abnormality. Bilateral L5 pars defects and grade 2 anterolisthesis of L5 on S1 again noted. IMPRESSION: Stranding within the anterior left lower abdominal fat at the level of the umbilicus -question injection site or contusion. No acute abnormality within the abdomen or pelvis. Electronically Signed   By: Margarette Canada M.D.   On: 02/19/2017 23:39   Dg Chest 2 View  Result Date: 02/19/2017 CLINICAL DATA:  Bilateral upper extremity numbness and weakness since last night. EXAM: CHEST  2 VIEW COMPARISON:  09/21/2015. FINDINGS: Normal sized heart. Clear lungs with normal vascularity. Minimal thoracic spine degenerative changes. Stable left subclavian AICD lead. IMPRESSION: No acute abnormality. Electronically Signed   By: Claudie Revering M.D.   On: 02/19/2017 18:58   Ct Head Wo Contrast  Result Date: 02/19/2017 CLINICAL DATA:  Bilateral upper extremity numbness and weakness since last night. Chronic right-sided deficits from a previous stroke, worse tonight. EXAM: CT HEAD WITHOUT CONTRAST TECHNIQUE: Contiguous axial images were obtained from the base of the skull through the vertex without intravenous contrast. COMPARISON:  04/11/2015. FINDINGS: Brain: Progressive enlargement of the ventricles and subarachnoid spaces. Interval old left corona radiata and external capsule infarct. Interval old right cerebellar hemisphere infarct. No intracranial hemorrhage, mass lesion or CT evidence of acute infarction. Vascular: No hyperdense vessel or unexpected calcification. Skull: Normal. Negative for fracture or focal lesion. Sinuses/Orbits: Unremarkable. Other: None. IMPRESSION: 1. No  acute abnormality. 2. Mildly progressive diffuse cerebral atrophy. 3. Interval old left cerebral and right cerebellar infarcts, as described above. Electronically Signed   By: Claudie Revering M.D.   On: 02/19/2017 19:41   US Carotid Bilateral  Result Date: 02/20/2017 CLINICAL DATA:  Cerebral infarction. Syncopal episode. Visual disturbance. History of CAD (post myocardial infarction), hypertension and hyperlipidemia. EXAM: BILATERAL CAROTID DUPLEX ULTRASOUND TECHNIQUE: Pearline Cables scale imaging, color Doppler and duplex ultrasound were performed of bilateral carotid and vertebral arteries in the neck. COMPARISON:  None. FINDINGS: Criteria: Quantification of carotid stenosis is based on velocity parameters that correlate the residual internal carotid diameter with NASCET-based stenosis levels, using the diameter of the distal internal carotid lumen as the denominator for stenosis measurement. The following velocity measurements were obtained: RIGHT ICA:  86/32 cm/sec CCA:  81/85 cm/sec SYSTOLIC ICA/CCA RATIO:  0.9 DIASTOLIC ICA/CCA RATIO:  1.1 ECA:  111 cm/sec LEFT ICA:  117/38 cm/sec CCA:  63/14 cm/sec SYSTOLIC ICA/CCA RATIO:  1.3 DIASTOLIC ICA/CCA RATIO:  1.4 ECA:  107 cm/sec RIGHT CAROTID ARTERY: There is no grayscale evidence of  significant intimal thickening or atherosclerotic plaque affecting interrogated portions of the right carotid system. There are no elevated peak systolic velocities within the interrogated course of the right internal carotid artery to suggest a hemodynamically significant stenosis. Borderline elevated peak systolic velocity within the proximal ICA is felt be factitiously elevated due to sampling at a location of turbulent flow and at the periphery of the vessel. RIGHT VERTEBRAL ARTERY:  Antegrade Flow LEFT CAROTID ARTERY: There is no grayscale evidence significant intimal thickening or atherosclerotic plaque affecting interrogated portions of the left carotid system. There are no elevated peak  systolic velocities within the interrogated course of the left internal carotid artery to suggest a hemodynamically significant stenosis. LEFT VERTEBRAL ARTERY:  Antegrade flow IMPRESSION: Normal carotid Doppler ultrasound. Electronically Signed   By: Sandi Mariscal M.D.   On: 02/20/2017 12:16    Micro Results     No results found for this or any previous visit (from the past 240 hour(s)).     Today   Subjective:   Claire Mcdonald today has  No Further weakness or numbness, stable for discharge.  Objective:   Blood pressure 108/64, pulse 66, temperature 98 F (36.7 C), resp. rate 20, height 5\' 1"  (1.549 m), weight 100.1 kg (220 lb 11.2 oz), last menstrual period 02/19/2017, SpO2 95 %.  No intake or output data in the 24 hours ending 02/23/17 1521  Exam Awake Alert, Oriented x 3, No new F.N deficits, Normal affect [place].AT,PERRAL Supple Neck,No JVD, No cervical lymphadenopathy appriciated.  Symmetrical Chest wall movement, Good air movement bilaterally, CTAB RRR,No Gallops,Rubs or new Murmurs, No Parasternal Heave +ve B.Sounds, Abd Soft, Non tender, No organomegaly appriciated, No rebound -guarding or rigidity. No Cyanosis, Clubbing or edema, No new Rash or bruise  Data Review   CBC w Diff:  Lab Results  Component Value Date   WBC 8.5 02/19/2017   HGB 14.6 02/19/2017   HGB 13.6 08/03/2015   HCT 43.8 02/19/2017   HCT 40.9 08/03/2015   PLT 274 02/19/2017   PLT 271 08/03/2015   LYMPHOPCT 32 07/20/2015   LYMPHOPCT 24.3 07/10/2014   MONOPCT 7 07/20/2015   MONOPCT 7.0 07/10/2014   EOSPCT 2 07/20/2015   EOSPCT 1.5 07/10/2014   BASOPCT 1 07/20/2015   BASOPCT 0.3 07/10/2014    CMP:  Lab Results  Component Value Date   NA 136 02/19/2017   NA 141 04/16/2015   NA 137 07/10/2014   K 3.7 02/19/2017   K 3.3 (L) 07/10/2014   CL 103 02/19/2017   CL 100 07/10/2014   CO2 26 02/19/2017   CO2 29 07/10/2014   BUN 21 (H) 02/19/2017   BUN 14 04/16/2015   BUN 17 07/10/2014    CREATININE 0.70 02/19/2017   CREATININE 0.81 09/21/2015   PROT 8.1 02/19/2017   PROT 6.1 04/16/2015   PROT 7.2 07/10/2014   ALBUMIN 4.2 02/19/2017   ALBUMIN 3.7 04/16/2015   ALBUMIN 3.5 07/10/2014   BILITOT 0.5 02/19/2017   BILITOT 0.3 04/16/2015   BILITOT 0.3 07/10/2014   ALKPHOS 76 02/19/2017   ALKPHOS 75 07/10/2014   AST 24 02/19/2017   AST 23 07/10/2014   ALT 16 02/19/2017   ALT 34 07/10/2014  .   Total Time in preparing paper work, data evaluation and todays exam - 55 minutes  Montavis Schubring M.D on 02/21/2017 at 3:21 PM    Note: This dictation was prepared with Dragon dictation along with smaller phrase technology. Any transcriptional errors that result from this  process are unintentional.

## 2017-06-04 DIAGNOSIS — M791 Myalgia, unspecified site: Secondary | ICD-10-CM | POA: Insufficient documentation

## 2017-06-17 ENCOUNTER — Ambulatory Visit: Payer: Medicaid Other | Attending: Family Medicine | Admitting: Physical Therapy

## 2017-06-17 ENCOUNTER — Encounter: Payer: Self-pay | Admitting: Physical Therapy

## 2017-06-17 VITALS — BP 105/79 | HR 72

## 2017-06-17 DIAGNOSIS — R262 Difficulty in walking, not elsewhere classified: Secondary | ICD-10-CM | POA: Diagnosis present

## 2017-06-17 DIAGNOSIS — M6281 Muscle weakness (generalized): Secondary | ICD-10-CM | POA: Insufficient documentation

## 2017-06-17 DIAGNOSIS — R296 Repeated falls: Secondary | ICD-10-CM | POA: Diagnosis present

## 2017-06-17 DIAGNOSIS — M25562 Pain in left knee: Secondary | ICD-10-CM

## 2017-06-17 NOTE — Therapy (Signed)
[facility] [address] [place], [place], [address] Phone: [phone]   Fax:  [phone]  Physical Therapy Evaluation  Patient Details  Name: Claire Mcdonald MRN: [id number] Date of Birth: 06-01-1966 Referring Provider: Dr Casper Harrison  Encounter Date: 06/17/2017      PT End of Session - 06/17/17 1226    Visit Number 1   Number of Visits 4   Date for PT Re-Evaluation 07/08/17   Authorization Type Medicaid   PT Start Time 6789   PT Stop Time 0952   PT Time Calculation (min) 53 min   Equipment Utilized During Treatment Gait belt   Activity Tolerance Patient tolerated treatment well;Patient limited by pain   Behavior During Therapy [facility] for tasks assessed/performed;Anxious      Past Medical History:  Diagnosis Date  . Allergy   . Anxiety   . Coronary artery disease   . DDD (degenerative disc disease), cervical   . Depression   . Diverticulosis    seen on CT 2015  . DVT (deep venous thrombosis) ([place])   . GERD (gastroesophageal reflux disease)   . Heart failure ([place])   . High cholesterol   . Hyperlipidemia   . Hypertension   . Idiopathic cardiac arrest ([place]) 02/2015   V. fib, EF nl, non-obs CAD; St. Jude ICD  . Migraine   . Obesity (BMI 30-39.9)   . Seizures ([place])   . Ventricular tachycardia [facility])     Past Surgical History:  Procedure Laterality Date  . CARDIAC CATHETERIZATION N/A 03/27/2015   Procedure: Left Heart Cath and Coronary Angiography;  Surgeon: Troy Sine, MD;  LAD 10%/30%, CFX and RCA OK, EF normal  . CENTRAL LINE  03/25/2015      . COLONOSCOPY WITH PROPOFOL N/A 10/01/2015   Procedure: COLONOSCOPY WITH PROPOFOL;  Surgeon: Josefine Class, MD;  Location: [facility] ENDOSCOPY;  Service: Endoscopy;  Laterality: N/A;  . EP IMPLANTABLE DEVICE N/A 04/11/2015   Procedure: ICD Implant;  Surgeon: Deboraha Sprang, MD;  [facility] ICD, serial number [id number]  . EP IMPLANTABLE DEVICE N/A 07/16/2015   Procedure: Lead  Revision/Repair;  Surgeon: Will Meredith Leeds, MD;  Location: [facility];  Service: Cardiovascular;  Laterality: N/A;  . ESOPHAGOGASTRODUODENOSCOPY (EGD) WITH PROPOFOL N/A 10/01/2015   Procedure: ESOPHAGOGASTRODUODENOSCOPY (EGD) WITH PROPOFOL;  Surgeon: Josefine Class, MD;  Location[facility] ENDOSCOPY;  Service: Endoscopy;  Laterality: N/A;  . HAND SURGERY Left   . OVARIAN CYST REMOVAL Left   . TUBAL LIGATION  1998    Vitals:   06/17/17 0856  BP: 105/79  Pulse: 72  SpO2: 98%         Subjective Assessment - 06/17/17 0856    Subjective Pt report pain with most movements in multiple locations and regionally; no pain reported at rest in sitting   Pertinent History 51 yo female presents with bilateral arm and knee pain (L knee is worst). Pt previously evaluated following CVA on 10-17-16 with right sided weakness. She reports her L knee pain started about a month ago and has gotten worse since then. She reports anterior and deep L knee pain with all AROM. Pt with medical Hx of essential hypertension: Controlled, history of DVT, Chronic Systolic Heart Failure, Seizure Disorder, MI (2016 with implanted defibrillator)   Limitations Walking;Lifting;House hold activities;Standing   How long can you stand comfortably? A few minutes    How long can you walk comfortably? Household distances   Diagnostic tests  Scheduled L knee x-ray 06/18/17; per chart review previous x-ray of L knee showed mild OA   Patient Stated Goals To have less pain and walk better   Currently in Pain? No/denies  Pain is 10/10 at worst (last night)   Pain Location Knee   Pain Orientation Right;Left   Pain Descriptors / Indicators Aching   Pain Type Chronic pain   Pain Onset More than a month ago   Pain Frequency Intermittent   Aggravating Factors  Movement, walking   Pain Relieving Factors Rest, pain meds   Effect of Pain on Daily Activities Decreased activity           [facility] PT Assessment - 06/18/17 0001       Assessment   Medical Diagnosis L knee pain   Referring Provider Dr Casper Harrison   Onset Date/Surgical Date --  1 month ago   Next MD Visit Tomorrow for L knee x-ray   Prior Therapy Therapy following CVA in February     Precautions   Precautions None     Restrictions   Weight Bearing Restrictions No     Balance Screen   Has the patient fallen in the past 6 months Yes   How many times? 3   Has the patient had a decrease in activity level because of a fear of falling?  Yes   Is the patient reluctant to leave their home because of a fear of falling?  Yes     [place] Private residence   Living Arrangements Children   Type of [place] to enter   Entrance Stairs-Number of Steps 10   Entrance Stairs-Rails Right     Cognition   Overall Cognitive Status Within Functional Limits for tasks assessed      PROM/AROM:  Upper Extremity: Limited shoulder elevation bilaterally due to weakness, pain with passive elevation past approx 140 deg bilaterally with pain at end range  Lower extremity: Generally hypomobile with pain limited range in multiple planes. Hip flexion, IR, ER all WFL passively bilaterally, but limited actively (see strength). R hip ext limited functionally with gait.   STRENGTH:  Graded on a 0-5 scale Muscle Group Left Right  Hip Flex 3 pain limited 2  Hip Abd 4- 4-  Hip Add 4 4  Knee Flex 3 pain limited 3 pain limited  Knee Ext 3+  3+  Ankle DF 4+ 4-   Sensory:  Light touch: Decreased throughout RLE, no sensation to light touch in R foot. Sensation to deep pressure intact     Coordination:   Appears grossly intact  Observations:   Posture: Moderately slumped trunk in sitting; sacral sitting, forward head, rounded shoulders. Edema in BLEs from ankle to thigh, no redness noted in calf   BALANCE: Pt has reduced static and dynamic balance due to LE pain and weakness bilaterally. Assess with Merrilee Jansky next  visit.  Static Standing Balance  Normal Able to maintain standing balance against maximal resistance   Good Able to maintain standing balance against moderate resistance   Good-/Fair+ Able to maintain standing balance against minimal resistance X  Fair Able to stand unsupported without UE support and without LOB for 1-2 min   Fair- Requires Min A and UE support to maintain standing without loss of balance   Poor+ Requires mod A and UE support to maintain standing without loss of balance   Poor Requires max A and UE support to maintain standing balance  without loss     Standing Dynamic Balance  Normal Stand independently unsupported, able to weight shift and cross midline maximally   Good Stand independently unsupported, able to weight shift and cross midline moderately   Good-/Fair+ Stand independently unsupported, able to weight shift across midline minimally   Fair Stand independently unsupported, weight shift, and reach ipsilaterally, loss of balance when crossing midline X  Poor+ Able to stand with Min A and reach ipsilaterally, unable to weight shift   Poor+ Able to stand with Mod A and minimally reach ipsilaterally, unable to cross midline.        SPECIAL TESTS:   Homan's: (-) bilaterally (pt with pain initially, but with slow squeeze and direct pressure pt had no pain)   No tone noted in RLE with quick stretch of gastroc or with knee to chest   FUNCTIONAL MOBILITY:   Transfers: Pt with max difficulty standing without UE assist requiring CGA for safety. Pt leans L over LLE when standing without UE assist. Pt able to stand without LOB with BUE assist.   Gait: Pt is antalgic on LLE with cane in R hand; pt ambulates with 2 point gait pattern with occasional incorrect sequencing. Pt has decreased hip and knee extension on RLE in stance/terminal stance, reduced heel strike on R, excessive lateral trunk lean to L when in swing on R with R hip hike, decreased arm swing on R. With  stairs pt is able to climb with RUE assist with cane in L hand with step-to pattern, instability noted. Pt reports falling on stairs in the past. Pt instructed in L sidestep with BUE support through rail on R. Pt unable to climb step leading with RLE due to weakness.    OUTCOME MEASURES: TEST Outcome Interpretation  5 times sit<>stand       19.8 sec with BUE assist Fall risk, decreased LE strength, below age group norms    10 meter walk test           0.62m/s with SPC Pt is limited community ambulator and would benefit from treatment to prevent fall/hospitalization       Objective measurements completed on examination: See above findings.    TREATMENT  Sitting  LAQ resisted with yellow t-band x 12 bilaterally  Hip abduction resisted YTB x 10 each LE  March alternating x 10 each LE Pt required mod cueing and demonstration for technique and will benefit from follow-up instruction  (see pt instructions)        PT Education - 06/17/17 1225    Education provided Yes   Education Details Plan of care, exam findings, HEP initiated   Person(s) Educated Patient   Methods Explanation;Demonstration;Tactile cues;Verbal cues;Handout   Comprehension Tactile cues required;Verbal cues required;Returned demonstration;Verbalized understanding             PT Long Term Goals - 06/17/17 1740      PT LONG TERM GOAL #1   Title Patient will increase 10 meter walk test to >1.78m/s as to improve gait speed for better community ambulation and to reduce fall risk.   Baseline 0.53m/s Pt is limited community ambulator and would benefit from treatment to prevent fall/hospitalization    Time 3   Period Weeks   Status New   Target Date 07/08/17     PT LONG TERM GOAL #2   Title Patient (< 49 years old) will complete five times sit to stand test in < 10 seconds indicating an increased LE strength and improved  balance.   Baseline 19.8 sec Fall risk, decreased LE strength, below age group norms    Time 3   Period Weeks   Status New   Target Date 07/08/17     PT LONG TERM GOAL #3   Title Patient will increase BLE gross strength by half a muscle grade to improve functional strength for independent gait, increased standing tolerance and increased ADL ability.   Baseline See eval 10/24   Time 3   Period Weeks   Status New   Target Date 07/08/17     PT LONG TERM GOAL #4   Title Patient will increase Berg Balance score by > 6 points to demonstrate decreased fall risk during functional activities.   Baseline Assess next visit   Time 3   Period Weeks   Status New   Target Date 07/08/17     PT LONG TERM GOAL #5   Title Patient will report a worst pain of 3/10 on VAS in R knee to improve tolerance with ADLs and reduced symptoms with functional activities.    Baseline 10/10 in last 24h   Time 3   Period Weeks   Status New   Target Date 07/08/17                Plan - 06/17/17 1235    Clinical Impression Statement  51 y/o female presents with multifocal pain in BUEs and  BLEs that is aggrivated by active and passive movement. L knee pain appears to be the primary concern and as pt with R sided weakness from previous stroke, this may be a result of compensation. Pt treated previously s/p CVA and has residual weakness and decreased sensation to light touch in RLE and R foot. Weakness and limited AROM noted in bilateral UE/LE. Pt is antalgic on LLE with cane in R hand; pt ambulates with 2 point gait pattern with occasional incorrect sequencing. 1m walk test indicates pt is a limited community ambulator. Pt is able to climb steps with RUE assist with cane in L hand with step-to pattern, instability noted. Pt reports falling on stairs in the past. Pt instructed in L sidestep with BUE support through rail on R. Pt unable to climb step leading with RLE due to weakness. 5x sit<>stand indicated pt is at increased fall risk.Pt will benefit from skilled PT to address strength, balance, and  pain deficits.    History and Personal Factors relevant to plan of care: (-) chronic multifocal pain, multiple systems, regions involved (+) young in age   Clinical Presentation Evolving   Clinical Presentation due to: Fall risk with Hx of falls   Clinical Decision Making Moderate   Rehab Potential Fair   PT Frequency 1x / week   PT Duration 3 weeks   PT Treatment/Interventions ADLs/Self Care Home Management;Aquatic Therapy;Biofeedback;Cryotherapy;Moist Heat;DME Instruction;Gait training;Electrical Stimulation;Stair training;Functional mobility training;Therapeutic activities;Patient/family education;Neuromuscular re-education;Balance training;Therapeutic exercise;Manual techniques;Energy conservation;Passive range of motion;Taping   PT Next Visit Plan Berg, further assess gait, develope HEP with resisted/AROM exercise, LEFS, stairs with sidestep to L, discuss aquatic therapy   PT Home Exercise Plan LAQ, seated hip abd, March   Recommended Other Services Aquatic therapy    Consulted and Agree with Plan of Care Patient      Patient will benefit from skilled therapeutic intervention in order to improve the following deficits and impairments:  Abnormal gait, Decreased activity tolerance, Decreased balance, Decreased knowledge of use of DME, Decreased mobility, Decreased range of motion, Decreased strength, Difficulty walking, Increased  edema, Hypomobility, Impaired flexibility, Impaired UE functional use, Impaired sensation, Impaired perceived functional ability, Improper body mechanics, Postural dysfunction, Pain  Visit Diagnosis: Left knee pain, unspecified chronicity  Difficulty in walking, not elsewhere classified  Repeated falls  Muscle weakness (generalized)     Problem List Patient Active Problem List   Diagnosis Date Noted  . Left-sided weakness 02/20/2017  . 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 VT (ventricular tachycardia) ([place]) 07/16/2015   . CAD (coronary artery disease) 04/16/2015  . Seizure disorder ([place]) 04/16/2015  . Heart failure ([place]) 04/16/2015  . Normal coronary arteries 04/09/2015  . Obesity-BMI 40 04/09/2015  . Pulmonary nodule seen on imaging study 04/09/2015  . Chronic anticoagulation 04/09/2015  . DVT (deep venous thrombosis) ([place])   . Cough   . Dyspnea   . Essential hypertension   . Hyperlipemia   . Cardiac arrest; V. fib, St. Jude ICD 03/25/2015  . DDD (degenerative disc disease), lumbar 01/28/2015  . Lumbar facet arthropathy 01/28/2015  . Sacroiliac joint disease 01/28/2015   Burnett Corrente, SPT  06/18/2017, 9:27 AM   This entire session was performed under direct supervision and direction of a licensed therapist/therapist assistant . I have personally read, edited and approve of the note as written. Collie Siad PT, DPT  [facility] [address] [place], [place], [address] Phone: [phone]   Fax:  [phone]  Name: LINDELL TUSSEY MRN: [id number] Date of Birth: [birth year]

## 2017-07-03 DIAGNOSIS — M25551 Pain in right hip: Secondary | ICD-10-CM | POA: Insufficient documentation

## 2017-07-03 DIAGNOSIS — M25552 Pain in left hip: Secondary | ICD-10-CM | POA: Insufficient documentation

## 2017-07-08 ENCOUNTER — Other Ambulatory Visit: Payer: Self-pay | Admitting: Family Medicine

## 2017-07-08 DIAGNOSIS — Z1239 Encounter for other screening for malignant neoplasm of breast: Secondary | ICD-10-CM

## 2017-07-14 ENCOUNTER — Ambulatory Visit: Payer: Medicaid Other | Attending: Family Medicine | Admitting: Physical Therapy

## 2017-07-14 ENCOUNTER — Encounter: Payer: Self-pay | Admitting: Physical Therapy

## 2017-07-14 DIAGNOSIS — M6281 Muscle weakness (generalized): Secondary | ICD-10-CM | POA: Insufficient documentation

## 2017-07-14 DIAGNOSIS — R262 Difficulty in walking, not elsewhere classified: Secondary | ICD-10-CM | POA: Insufficient documentation

## 2017-07-14 DIAGNOSIS — M25562 Pain in left knee: Secondary | ICD-10-CM | POA: Insufficient documentation

## 2017-07-14 DIAGNOSIS — R296 Repeated falls: Secondary | ICD-10-CM | POA: Insufficient documentation

## 2017-07-14 NOTE — Therapy (Signed)
[facility] [address] [place], [place], [address] Phone: [phone]   Fax:  [phone]  Physical Therapy Treatment  Patient Details  Name: Claire Mcdonald MRN: [id number] Date of Birth: Mar 06, 1966 Referring Provider: Dr Casper Harrison   Encounter Date: 07/14/2017  PT End of Session - 07/15/17 0750    Visit Number  2    Number of Visits  4    Date for PT Re-Evaluation  08/05/17    Authorization Type  Medicaid    Authorization - Visit Number  1    Authorization - Number of Visits  3    PT Start Time  0720    PT Stop Time  0819    PT Time Calculation (min)  59 min    Equipment Utilized During Treatment  Gait belt    Activity Tolerance  Patient tolerated treatment well;No increased pain    Behavior During Therapy  [facility] for tasks assessed/performed;Flat affect       Past Medical History:  Diagnosis Date  . Allergy   . Anxiety   . Coronary artery disease   . DDD (degenerative disc disease), cervical   . Depression   . Diverticulosis    seen on CT 2015  . DVT (deep venous thrombosis) ([place])   . GERD (gastroesophageal reflux disease)   . Heart failure ([place])   . High cholesterol   . Hyperlipidemia   . Hypertension   . Idiopathic cardiac arrest ([place]) 02/2015   V. fib, EF nl, non-obs CAD; St. Jude ICD  . Migraine   . Obesity (BMI 30-39.9)   . Seizures ([place])   . Ventricular tachycardia [facility])     Past Surgical History:  Procedure Laterality Date  . CARDIAC CATHETERIZATION N/A 03/27/2015   Procedure: Left Heart Cath and Coronary Angiography;  Surgeon: Troy Sine, MD;  LAD 10%/30%, CFX and RCA OK, EF normal  . CENTRAL LINE  03/25/2015      . COLONOSCOPY WITH PROPOFOL N/A 10/01/2015   Procedure: COLONOSCOPY WITH PROPOFOL;  Surgeon: Josefine Class, MD;  Location: [facility] ENDOSCOPY;  Service: Endoscopy;  Laterality: N/A;  . EP IMPLANTABLE DEVICE N/A 04/11/2015   Procedure: ICD Implant;  Surgeon: Deboraha Sprang, MD;  [facility] ICD,  serial number [phone]  . EP IMPLANTABLE DEVICE N/A 07/16/2015   Procedure: Lead Revision/Repair;  Surgeon: Will Meredith Leeds, MD;  Location: [place] CV LAB;  Service: Cardiovascular;  Laterality: N/A;  . ESOPHAGOGASTRODUODENOSCOPY (EGD) WITH PROPOFOL N/A 10/01/2015   Procedure: ESOPHAGOGASTRODUODENOSCOPY (EGD) WITH PROPOFOL;  Surgeon: Josefine Class, MD;  Location: [facility] ENDOSCOPY;  Service: Endoscopy;  Laterality: N/A;  . HAND SURGERY Left   . OVARIAN CYST REMOVAL Left   . TUBAL LIGATION  1998    There were no vitals filed for this visit.  Subjective Assessment - 07/14/17 0724    Subjective  Pt returns about 1 month after initial evaluation. She has had an injection in lateral L knee, which she says has helped. No pain at this time. No falls recently, though pt has has several near falls.    Pertinent History  51 yo female presents with bilateral arm and knee pain (L knee is worst). Pt previously evaluated following CVA on 10-17-16 with right sided weakness. She reports her L knee pain started about a month ago and has gotten worse since then. She reports anterior and deep L knee pain with all AROM. Pt with medical Hx of essential hypertension: Controlled, history  of DVT, Chronic Systolic Heart Failure, Seizure Disorder, MI (2016 with implanted defibrillator)    Limitations  Walking;Lifting;House hold activities;Standing    How long can you stand comfortably?  A few minutes     How long can you walk comfortably?  Household distances    Diagnostic tests  Scheduled L knee x-ray 06/18/17; per chart review previous x-ray of L knee showed mild OA    Patient Stated Goals  To have less pain and walk better    Currently in Pain?  No/denies    Pain Onset  More than a month ago         Goals Re-assessed:  5x sit<>stand: improved from 19.8 sec on 10/24 to 15.5 sec with BUE assit (still indicating weakness/fall risk, but significantly improved).  Gait: 648mwalk improved form 0.22m to  0.8131mwith SPC (pt with heavily antalgic gait on R, L lateral trunk lean when in swing on RE, and lateral drift and LOB to the L requiring SBA for safety). Gait improved further with Rollator to 0.59m102mith improved stability and more normalized gait with no LOB.     TREATMENT  Ther-ex: Re-instruct HEP: pt performing seated hip abduction, march, and LAQ correctly. Band resistance increased to GTB.  Hooklying:             SAQ RLE x 10 with bolster under knees              Clamshell resisted GTB x 10 each LE             LE circles/bicycles 2 x 10 each side    SLR x 5 each side (pt with increased difficulty on R)   Bridge x 10 with 2-3 sec hold x 10  Standing in // bars:  4 way hip resisted with YTB including hip flexion, abduction, extension x 10 each exercise on side with BUE assist. Pt required extensive cueing to perform correct technique with isolation of motion.   HEP updated with 4-way hip, SLR, sidelying hip abd, and bridge (see pt instructions)  Gait:  Pt instructed in gait with Rollator x 1 lap in // bars and around gym x 2 laps ~100'. Pt had improved stability and less antalgic gait with Rollator. Stability was improved and gait speed increased significantly with Rollator vs SPC    Stairs 3 x 4 steps; pt instructed in sidestep with BUE support through R side rail; cues to take larger step to make room for second foot on step with step-to pattern.      PT Education - 07/15/17 0750    Education provided  Yes    Education Details  Plan of care, progress towards goals, HEP advanced    Person(s) Educated  Patient    Methods  Explanation;Demonstration;Tactile cues;Verbal cues;Handout    Comprehension  Verbalized understanding;Returned demonstration;Verbal cues required;Tactile cues required          PT Long Term Goals - 07/14/17 0812      PT LONG TERM GOAL #1   Title  Patient will increase 10 meter walk test to >1.48m/s65m to improve gait speed for better community  ambulation and to reduce fall risk.    Baseline  0.22m/s36mis limited community ambulator and would benefit from treatment to prevent fall/hospitalization;   Improved to 0.59m/s 13m Rollator, and 0.13m/s w44mSPC     Time  3    Period  Weeks    Status  Partially Met    Target Date  08/04/17      PT LONG TERM GOAL #2   Title  Patient (< 86 years old) will complete five times sit to stand test in < 10 seconds indicating an increased LE strength and improved balance.    Baseline  19.8 sec Fall risk, decreased LE strength, below age group norms;      15.5 sec 07/14/17    Time  3    Period  Weeks    Status  Partially Met    Target Date  08/04/17      PT LONG TERM GOAL #3   Title  Patient will increase BLE gross strength by half a muscle grade to improve functional strength for independent gait, increased standing tolerance and increased ADL ability.    Baseline  See eval 10/24;    functional improvement in 5x sit<>stand    Time  3    Period  Weeks    Status  Partially Met    Target Date  08/04/17      PT LONG TERM GOAL #4   Title  Patient will increase Berg Balance score by > 6 points to demonstrate decreased fall risk during functional activities.    Baseline  Assess next visit    Time  3    Period  Weeks    Status  New    Target Date  08/04/17      PT LONG TERM GOAL #5   Title  Patient will report a worst pain of 3/10 on VAS in R knee to improve tolerance with ADLs and reduced symptoms with functional activities.     Baseline  10/10 in last 24h;   no change 11/20    Time  3    Period  Weeks    Status  On-going    Target Date  08/04/17            Plan - 07/15/17 0758    Clinical Impression Statement  Pt returns to therapy approximately 1 month after initial evaluation. Pt had an injection in L lateral knee about a week ago and had had decreased L knee pain. She reports she has been doing her HEP regularly. Pt has has made progress towards gait speed and 5x sit<>stand  goals indicating improved LE strength and decreased risk of hospitalization. Pt has had no falls but has had many near falls recently. Pt gait assessed with Rollator; recommend Rollator at this time due to improved stability, gait mechanics, and gait speed with Rollator compared to [facility].     Clinical Presentation  Evolving    Clinical Decision Making  Moderate    Rehab Potential  Fair    PT Frequency  1x / week    PT Duration  3 weeks    PT Treatment/Interventions  ADLs/Self Care Home Management;Aquatic Therapy;Biofeedback;Cryotherapy;Moist Heat;DME Instruction;Gait training;Electrical Stimulation;Stair training;Functional mobility training;Therapeutic activities;Patient/family education;Neuromuscular re-education;Balance training;Therapeutic exercise;Manual techniques;Energy conservation;Passive range of motion;Taping    PT Next Visit Plan  Follow-up on Rollator purchase, Berg, gait with Rollator, stairs, advance HEP with resisted/AROM exercise, discuss aquatic therapy    PT Home Exercise Plan  Added 4-way hip, SLR, sidelying hip abd, and bridge; Continued LAQ, seated hip abd, March    Consulted and Agree with Plan of Care  Patient       Patient will benefit from skilled therapeutic intervention in order to improve the following deficits and impairments:  Abnormal gait, Decreased activity tolerance, Decreased balance, Decreased knowledge of use of DME, Decreased mobility, Decreased range  of motion, Decreased strength, Difficulty walking, Increased edema, Hypomobility, Impaired flexibility, Impaired UE functional use, Impaired sensation, Impaired perceived functional ability, Improper body mechanics, Postural dysfunction, Pain  Visit Diagnosis: Left knee pain, unspecified chronicity  Difficulty in walking, not elsewhere classified  Repeated falls  Muscle weakness (generalized)     Problem List Patient Active Problem List   Diagnosis Date Noted  . Left-sided weakness 02/20/2017  . ICD  (implantable cardioverter-defibrillator) lead failure 07/16/2015  . VT (ventricular tachycardia) ([place]) 07/16/2015  . CAD (coronary artery disease) 04/16/2015  . Seizure disorder ([place]) 04/16/2015  . Heart failure ([place]) 04/16/2015  . Normal coronary arteries 04/09/2015  . Obesity-BMI 40 04/09/2015  . Pulmonary nodule seen on imaging study 04/09/2015  . Chronic anticoagulation 04/09/2015  . DVT (deep venous thrombosis) ([place])   . Cough   . Dyspnea   . Essential hypertension   . Hyperlipemia   . Cardiac arrest; V. fib, St. Jude ICD 03/25/2015  . DDD (degenerative disc disease), lumbar 01/28/2015  . Lumbar facet arthropathy 01/28/2015  . Sacroiliac joint disease 01/28/2015   Ishi Danser Lenis Dickinson, SPT Pura Spice, PT, DPT # [id number] 07/15/2017, 9:09 AM  [facility] [address] [place], [place], [address] Phone: [phone]   Fax:  [phone]  Name: KORYN CHARLOT MRN: [id number] Date of Birth: June 08, 1966

## 2017-07-15 ENCOUNTER — Encounter: Payer: Self-pay | Admitting: Physical Therapy

## 2017-07-20 ENCOUNTER — Encounter: Payer: Medicaid Other | Admitting: Physical Therapy

## 2017-07-27 ENCOUNTER — Encounter: Payer: Medicaid Other | Admitting: Physical Therapy

## 2017-08-04 ENCOUNTER — Encounter: Payer: Medicaid Other | Admitting: Physical Therapy

## 2017-08-07 ENCOUNTER — Encounter: Payer: Medicaid Other | Admitting: Physical Therapy

## 2017-08-14 DIAGNOSIS — M79604 Pain in right leg: Secondary | ICD-10-CM | POA: Insufficient documentation

## 2017-09-03 DIAGNOSIS — M059 Rheumatoid arthritis with rheumatoid factor, unspecified: Secondary | ICD-10-CM | POA: Insufficient documentation

## 2017-09-29 ENCOUNTER — Inpatient Hospital Stay: Admission: RE | Admit: 2017-09-29 | Payer: Medicaid Other | Source: Ambulatory Visit

## 2017-10-09 DIAGNOSIS — Z09 Encounter for follow-up examination after completed treatment for conditions other than malignant neoplasm: Secondary | ICD-10-CM | POA: Insufficient documentation

## 2017-10-09 DIAGNOSIS — Z8659 Personal history of other mental and behavioral disorders: Secondary | ICD-10-CM | POA: Insufficient documentation

## 2017-10-09 DIAGNOSIS — R296 Repeated falls: Secondary | ICD-10-CM | POA: Insufficient documentation

## 2017-10-13 ENCOUNTER — Ambulatory Visit
Admission: RE | Admit: 2017-10-13 | Discharge: 2017-10-13 | Disposition: A | Discharge status: Home / Self Care | Payer: Medicaid Other | Source: Ambulatory Visit | Attending: Family Medicine | Admitting: Family Medicine

## 2017-10-13 DIAGNOSIS — Z1239 Encounter for other screening for malignant neoplasm of breast: Secondary | ICD-10-CM

## 2017-10-13 DIAGNOSIS — Z1231 Encounter for screening mammogram for malignant neoplasm of breast: Secondary | ICD-10-CM | POA: Insufficient documentation

## 2017-11-16 DIAGNOSIS — R32 Unspecified urinary incontinence: Secondary | ICD-10-CM | POA: Insufficient documentation

## 2017-11-30 ENCOUNTER — Other Ambulatory Visit: Payer: Self-pay

## 2017-11-30 ENCOUNTER — Ambulatory Visit: Payer: Medicaid Other | Attending: Nurse Practitioner | Admitting: Nurse Practitioner

## 2017-11-30 ENCOUNTER — Other Ambulatory Visit
Admission: RE | Admit: 2017-11-30 | Discharge: 2017-11-30 | Disposition: A | Discharge status: Home / Self Care | Payer: Medicaid Other | Source: Ambulatory Visit | Attending: Nurse Practitioner | Admitting: Nurse Practitioner

## 2017-11-30 ENCOUNTER — Encounter: Payer: Self-pay | Admitting: Nurse Practitioner

## 2017-11-30 ENCOUNTER — Ambulatory Visit
Admission: RE | Admit: 2017-11-30 | Discharge: 2017-11-30 | Disposition: A | Discharge status: Home / Self Care | Payer: Medicaid Other | Source: Ambulatory Visit | Attending: Nurse Practitioner | Admitting: Nurse Practitioner

## 2017-11-30 DIAGNOSIS — M533 Sacrococcygeal disorders, not elsewhere classified: Secondary | ICD-10-CM | POA: Diagnosis not present

## 2017-11-30 DIAGNOSIS — M25551 Pain in right hip: Secondary | ICD-10-CM | POA: Insufficient documentation

## 2017-11-30 DIAGNOSIS — E785 Hyperlipidemia, unspecified: Secondary | ICD-10-CM | POA: Diagnosis not present

## 2017-11-30 DIAGNOSIS — G5603 Carpal tunnel syndrome, bilateral upper limbs: Secondary | ICD-10-CM | POA: Insufficient documentation

## 2017-11-30 DIAGNOSIS — M5442 Lumbago with sciatica, left side: Secondary | ICD-10-CM

## 2017-11-30 DIAGNOSIS — Z79899 Other long term (current) drug therapy: Secondary | ICD-10-CM

## 2017-11-30 DIAGNOSIS — M79605 Pain in left leg: Secondary | ICD-10-CM

## 2017-11-30 DIAGNOSIS — Z79891 Long term (current) use of opiate analgesic: Secondary | ICD-10-CM | POA: Insufficient documentation

## 2017-11-30 DIAGNOSIS — Z95810 Presence of automatic (implantable) cardiac defibrillator: Secondary | ICD-10-CM | POA: Insufficient documentation

## 2017-11-30 DIAGNOSIS — M79604 Pain in right leg: Secondary | ICD-10-CM

## 2017-11-30 DIAGNOSIS — G8929 Other chronic pain: Secondary | ICD-10-CM | POA: Insufficient documentation

## 2017-11-30 DIAGNOSIS — I10 Essential (primary) hypertension: Secondary | ICD-10-CM | POA: Insufficient documentation

## 2017-11-30 DIAGNOSIS — M899 Disorder of bone, unspecified: Secondary | ICD-10-CM | POA: Insufficient documentation

## 2017-11-30 DIAGNOSIS — Z8673 Personal history of transient ischemic attack (TIA), and cerebral infarction without residual deficits: Secondary | ICD-10-CM | POA: Insufficient documentation

## 2017-11-30 DIAGNOSIS — Z8674 Personal history of sudden cardiac arrest: Secondary | ICD-10-CM | POA: Diagnosis not present

## 2017-11-30 DIAGNOSIS — M5441 Lumbago with sciatica, right side: Secondary | ICD-10-CM | POA: Diagnosis not present

## 2017-11-30 DIAGNOSIS — Z6837 Body mass index (BMI) 37.0-37.9, adult: Secondary | ICD-10-CM | POA: Insufficient documentation

## 2017-11-30 DIAGNOSIS — M5136 Other intervertebral disc degeneration, lumbar region: Secondary | ICD-10-CM | POA: Insufficient documentation

## 2017-11-30 DIAGNOSIS — M059 Rheumatoid arthritis with rheumatoid factor, unspecified: Secondary | ICD-10-CM | POA: Insufficient documentation

## 2017-11-30 DIAGNOSIS — G894 Chronic pain syndrome: Secondary | ICD-10-CM | POA: Insufficient documentation

## 2017-11-30 DIAGNOSIS — E669 Obesity, unspecified: Secondary | ICD-10-CM | POA: Insufficient documentation

## 2017-11-30 DIAGNOSIS — Z789 Other specified health status: Secondary | ICD-10-CM

## 2017-11-30 DIAGNOSIS — K219 Gastro-esophageal reflux disease without esophagitis: Secondary | ICD-10-CM | POA: Diagnosis not present

## 2017-11-30 DIAGNOSIS — M4316 Spondylolisthesis, lumbar region: Secondary | ICD-10-CM | POA: Diagnosis not present

## 2017-11-30 DIAGNOSIS — F329 Major depressive disorder, single episode, unspecified: Secondary | ICD-10-CM | POA: Diagnosis not present

## 2017-11-30 DIAGNOSIS — Z86718 Personal history of other venous thrombosis and embolism: Secondary | ICD-10-CM | POA: Insufficient documentation

## 2017-11-30 LAB — COMPREHENSIVE METABOLIC PANEL
ALT: 18 U/L (ref 14–54)
ANION GAP: 8 (ref 5–15)
AST: 23 U/L (ref 15–41)
Albumin: 4 g/dL (ref 3.5–5.0)
Alkaline Phosphatase: 68 U/L (ref 38–126)
BUN: 19 mg/dL (ref 6–20)
CHLORIDE: 105 mmol/L (ref 101–111)
CO2: 25 mmol/L (ref 22–32)
Calcium: 9.1 mg/dL (ref 8.9–10.3)
Creatinine, Ser: 1.01 mg/dL — ABNORMAL HIGH (ref 0.44–1.00)
GFR calc Af Amer: >60 mL/min (ref >60)
GFR calc non Af Amer: >60 mL/min (ref >60)
GLUCOSE: 97 mg/dL (ref 65–99)
Potassium: 3.6 mmol/L (ref 3.5–5.1)
SODIUM: 138 mmol/L (ref 135–145)
Total Bilirubin: 0.5 mg/dL (ref 0.3–1.2)
Total Protein: 7.6 g/dL (ref 6.5–8.1)

## 2017-11-30 LAB — SEDIMENTATION RATE: Sed Rate: 19 mm/hr (ref 0–20)

## 2017-11-30 LAB — MAGNESIUM: MAGNESIUM: 2.2 mg/dL (ref 1.7–2.4)

## 2017-11-30 LAB — VITAMIN B12: Vitamin B-12: 239 pg/mL (ref 180–914)

## 2017-11-30 LAB — C-REACTIVE PROTEIN: CRP: <0.8 mg/dL (ref <1.0)

## 2017-11-30 NOTE — Progress Notes (Signed)
Safety precautions to be maintained throughout the outpatient stay will include: orient to surroundings, keep bed in low position, maintain call bell within reach at all times, provide assistance with transfer out of bed and ambulation.  

## 2017-11-30 NOTE — Progress Notes (Signed)
Patient's Name: Claire Mcdonald  MRN: [id number]  Referring Provider: Cyndia Skeeters, MD  DOB: [birth year]  PCP: [facility]  DOS: 11/30/2017  Note by: Dionisio David NP  Service setting: Ambulatory outpatient  Specialty: Interventional Pain Management  Location: [facility] (AMB) Pain Management Facility    Patient type: New Patient    Primary Reason(s) for Visit: Initial Patient Evaluation CC: Back Pain (lower)  HPI  Ms. Pabst is a 52 y.o. year old, female patient, who comes today for an initial evaluation. She has DDD (degenerative disc disease), lumbar; Lumbar facet arthropathy; Sacroiliac joint disease; Cardiac arrest; V. fib, St. Jude ICD; Essential hypertension; Hyperlipidemia; Cough; Dyspnea; Deep vein thrombosis (DVT) ([place]); Normal coronary arteries; Obesity (BMI 35.0-39.9 without comorbidity); Pulmonary nodule seen on imaging study; Chronic anticoagulation; CAD (coronary artery disease); Seizure disorder ([place]); Heart failure ([place]); ICD (implantable cardioverter-defibrillator) lead failure; Ventricular tachycardia ([place]); Left-sided weakness; Anxiety; Asthma; Atypical squamous cell changes of undetermined significance (ASCUS) on cervical cytology with positive high risk human papilloma virus (HPV); Bilateral carpal tunnel syndrome; Chronic back pain; Diverticulosis of large intestine without hemorrhage; Food insecurity; Frequent falls; Gastroesophageal reflux disease without esophagitis; Hernia, incisional; Hiatal hernia; History of DVT (deep vein thrombosis); Intramural leiomyoma of uterus; Ischemic stroke ([place]); Mental disorder follow-up; Migraine headache; Moderate episode of recurrent major depressive disorder ([place]); Myalgia; Right hip pain; 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 Her primarily concern today is the Back Pain (lower)  Pain Assessment: Location: Right, Left, Lower Back Radiating: both legs to the feet Onset: More than a month ago Duration: Chronic pain Quality: Throbbing, Aching Severity: 9 /10 (self-reported pain score)  Note: Reported level is compatible with observation. Clinically the patient looks like a 2/10 A 2/10 is viewed as "Mild to Moderate" and described as noticeable and distracting. Impossible to hide from other people. More frequent flare-ups. Still possible to adapt and function close to normal. It can be very annoying and may have occasional stronger flare-ups. With discipline, patients may get used to it and adapt. Information on the proper use of the pain scale provided to the patient today. When using our objective Pain Scale, levels between 6 and 10/10 are said to belong in an emergency room, as it progressively worsens from a 6/10, described as severely limiting, requiring emergency care not usually available at an outpatient pain management facility. At a 6/10 level, communication becomes difficult and requires great effort. Assistance to reach the emergency department may be required. Facial flushing and profuse sweating along with potentially dangerous increases in heart rate and blood pressure will be evident. Effect on ADL: difficulty walking, painful to sit Timing: Constant Modifying factors: Gabapentin, lying down  Onset and Duration: Sudden and Present longer than 3 months Cause of pain: Work related accident or event Severity: Getting worse, NAS-11 at its worse: 10/10, NAS-11 at its best: 7/10, NAS-11 now: 9/10 and NAS-11 on the average: 10/10 Timing: Afternoon and During activity or exercise Aggravating Factors: Bending, Climbing,  Kneeling, Lifiting, Prolonged sitting, Prolonged standing, Squatting, Twisting, Walking, Walking uphill, Walking downhill and Working Alleviating Factors: Nerve blocks Associated Problems: Pain that wakes patient up Quality of Pain: Sharp, Shooting and Throbbing Previous Examinations  or Tests: The patient denies any previous exams or tests Previous Treatments: Facet blocks  The patient comes into the clinics today for the first time for a chronic pain management evaluation. According to the patient her primary area of pain is in her lower back. She admits this is related to a ground-level fall she suffered in 1998. She states that she fell on some oil. She denies any previous surgery. She admits that she was having lumbar facet nerve blocks by Dr.Gregory Crisp. She admits that the injections were effective for approximately one month period. She denies physical therapy which she has for her lower back. She denies any recent images.  Her second area of pain is in her lower extremities. She admits that the pain goes down the back of her leg to the bottom of her feet. She admits that she has numbness on the right side in the thigh area only. She does have weakness. She admits that right-sided weakness and was a result of a stroke that she suffered last year. She is currently in physical therapy for her weakness.  Today I took the time to provide the patient with information regarding this pain practice. The patient was informed that the practice is divided into two sections: an interventional pain management section, as well as a completely separate and distinct medication management section. I explained that there are procedure days for interventional therapies, and evaluation days for follow-ups and medication management. Because of the amount of documentation required during both, they are kept separated. This means that there is the possibility that she may be scheduled for a procedure on one day, and  medication management the next. I have also informed her that because of staffing and facility limitations, this practice will no longer take patients for medication management only. To illustrate the reasons for this, I gave the patient the example of surgeons, and how inappropriate it would be to refer a patient to his/her care, just to write for the post-surgical antibiotics on a surgery done by a different surgeon.   Because interventional pain management is part of the board-certified specialty for the doctors, the patient was informed that joining this practice means that they are open to any and all interventional therapies. I made it clear that this does not mean that they will be forced to have any procedures done. What this means is that I believe interventional therapies to be essential part of the diagnosis and proper management of chronic pain conditions. Therefore, patients not interested in these interventional alternatives will be better served under the care of a different practitioner.  The patient was also made aware of my Comprehensive Pain Management Safety Guidelines where by joining this practice, they limit all of their nerve blocks and joint injections to those done by our practice, for as long as we are retained to manage their care. Historic Controlled Substance Pharmacotherapy Review  PMP and historical list of controlled substances: oxycodone/acetaminophen 5/325 mg, oxycodone 5 mg, hydrocodone/acetaminophen 5/325 mg, acetaminophen/codeine No. 3, Highest opioid analgesic regimen found: oxycodone/acetaminophen 5/325 2 tablets every 4 hours(fill date 01/04/2013) oxycodone 40 mg per day Most recent opioid analgesic: oxycodone/acetaminophen 5/325 3 times daily(last fill date 11/11/2017) oxycodone 15 mg per day Current opioid analgesics: none Highest recorded MME/day: 60 mg/day MME/day: 0 mg/day Medications: The patient did not bring the medication(s) to the appointment, as  requested in our "New Patient Package" Pharmacodynamics: Desired effects: Analgesia: The patient reports >50% benefit. Reported improvement in function: The patient reports  medication allows her to accomplish basic ADLs. Clinically meaningful improvement in function (CMIF): Sustained CMIF goals met Perceived effectiveness: Described as relatively effective, allowing for increase in activities of daily living (ADL) Undesirable effects: Side-effects or Adverse reactions: None reported Historical Monitoring: The patient  reports that she does not use drugs. List of all UDS Test(s): Lab Results  Component Value Date   MDMA NONE DETECTED 03/25/2015   COCAINSCRNUR NONE DETECTED 03/25/2015   PCPSCRNUR NONE DETECTED 03/25/2015   THCU NONE DETECTED 03/25/2015   ETH <5 02/19/2017   ETH <5 03/25/2015   List of all Serum Drug Screening Test(s):  No results found for: AMPHSCRSER, BARBSCRSER, BENZOSCRSER, COCAINSCRSER, PCPSCRSER, PCPQUANT, THCSCRSER, CANNABQUANT, OPIATESCRSER, OXYSCRSER, PROPOXSCRSER Historical Background Evaluation: [place] PDMP: Six (6) year initial data search conducted.             [place] Department of public safety, offender search: Editor, commissioning Information) Non-contributory Risk Assessment Profile: Aberrant behavior: None observed or detected today Risk factors for fatal opioid overdose: None identified today Fatal overdose hazard ratio (HR): Calculation deferred Non-fatal overdose hazard ratio (HR): Calculation deferred Risk of opioid abuse or dependence: 0.7-3.0% with doses ? 36 MME/day and 6.1-26% with doses ? 120 MME/day. Substance use disorder (SUD) risk level: Pending results of Medical Psychology Evaluation for SUD Opioid risk tool (ORT) (Total Score): 0  ORT Scoring interpretation table:  Score <3 = Low Risk for SUD  Score between 4-7 = Moderate Risk for SUD  Score >8 = High Risk for Opioid Abuse   PHQ-2 Depression Scale:  Total score: 0  PHQ-2 Scoring interpretation  table: (Score and probability of major depressive disorder)  Score 0 = No depression  Score 1 = 15.4% Probability  Score 2 = 21.1% Probability  Score 3 = 38.4% Probability  Score 4 = 45.5% Probability  Score 5 = 56.4% Probability  Score 6 = 78.6% Probability   PHQ-9 Depression Scale:  Total score: 0  PHQ-9 Scoring interpretation table:  Score 0-4 = No depression  Score 5-9 = Mild depression  Score 10-14 = Moderate depression  Score 15-19 = Moderately severe depression  Score 20-27 = Severe depression (2.4 times higher risk of SUD and 2.89 times higher risk of overuse)   Pharmacologic Plan: Pending ordered tests and/or consults  Meds  The patient has a current medication list which includes the following prescription(s): albuterol, amitriptyline, atorvastatin, citalopram, furosemide, gabapentin, lisinopril, magnesium, metoprolol tartrate, rivaroxaban, and topiramate.  Current Outpatient Medications on File Prior to Visit  Medication Sig  . albuterol (PROVENTIL HFA;VENTOLIN HFA) 108 (90 BASE) MCG/ACT inhaler Inhale 2 puffs into the lungs every 6 (six) hours as needed for wheezing or shortness of breath.  Marland Kitchen amitriptyline (ELAVIL) 25 MG tablet Take 25 mg by mouth at bedtime.  Marland Kitchen atorvastatin (LIPITOR) 20 MG tablet Take 1 tablet (20 mg total) by mouth daily at 6 PM. (Patient taking differently: Take 40 mg by mouth daily at 6 PM. )  . citalopram (CELEXA) 40 MG tablet Take 40 mg by mouth daily.  . furosemide (LASIX) 20 MG tablet Take 20 mg by mouth daily and may take an additional daily as directed (Patient taking differently: 40 mg. Take 20 mg by mouth daily and may take an additional daily as directed)  . gabapentin (NEURONTIN) 300 MG capsule Take 300 mg by mouth daily.  Marland Kitchen lisinopril (PRINIVIL,ZESTRIL) 5 MG tablet Take 5 mg by mouth daily.  . Magnesium 400 MG TABS Take 400 mg by mouth daily.  . metoprolol  tartrate (LOPRESSOR) 25 MG tablet Take 0.5 tablets (12.5 mg total) by mouth 2 (two)  times daily. (Patient taking differently: Take 25 mg by mouth 2 (two) times daily. )  . rivaroxaban (XARELTO) 20 MG TABS tablet Take 20 mg by mouth daily with supper.  . topiramate (TOPAMAX) 100 MG tablet Take 1 tablet (100 mg total) by mouth 2 (two) times daily.   No current facility-administered medications on file prior to visit.    Imaging Review  Cervical Imaging: Cervical MR wo contrast:  Results for orders placed during the hospital encounter of 11/21/07  MR Cervical Spine Wo Contrast   Narrative Clinical Data:  Neck pain.  Low back pain.  Bilateral shoulder and arm pain.  Left arm weakness.  Bilateral leg weakness.   MRI CERVICAL SPINE WITHOUT CONTRAST   Technique: Multiplanar and multiecho pulse sequences of the cervical spine, to include the craniocervical junction and cervicothoracic junction, were obtained according to standard protocol without  intravenous contrast.   Comparison: None   Findings:  ]   Alignment of the spine is normal.  There is no abnormality at the foramen magnum or C1-2.  The intervertebral discs throughout the cervical region are normal.  The spinal canal is widely patent. Ample subarachnoid space surrounds the cord.  No abnormal cord signal is seen.  No osseous or articular pathology.   IMPRESSION: Normal MRI of the cervical spine   MRI LUMBAR SPINE WITHOUT CONTRAST   Technique: Multiplanar and multiecho pulse sequences of the lumbar spine were obtained  without intravenous contrast.   Comparison: None   Findings:  There is no abnormality at L4-5 or above.  The discs are unremarkable.  The canal and foramina are widely patent.  The distal cord and conus are normal.   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  The disc is degenerated and shows a shallow protrusion.  The central canal is sufficiently patent, but there is bilateral neural foraminal stenosis that could effect either or both L5  nerve roots.   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  Provider: Mancel Bale    Cervical CT wo contrast:  Results for orders placed during the hospital encounter of 03/25/15  CT Cervical Spine Wo Contrast   Narrative CLINICAL DATA:  Found on floor at work.  Unresponsive.  EXAM: CT HEAD WITHOUT CONTRAST  CT CERVICAL SPINE WITHOUT CONTRAST  TECHNIQUE: Multidetector CT imaging of the head and cervical spine was performed following the standard protocol without intravenous contrast. Multiplanar CT image reconstructions of the cervical spine were also generated.  COMPARISON:  10/27/2012  FINDINGS: CT HEAD FINDINGS  No acute intracranial abnormality. Specifically, no hemorrhage, hydrocephalus, mass lesion, acute infarction, or significant intracranial injury. No acute calvarial abnormality. Visualized paranasal sinuses and mastoids clear. Orbital soft tissues unremarkable.  CT CERVICAL SPINE FINDINGS  Motion artifact degrades image quality. No fracture visualized. No malalignment. Prevertebral soft tissues are normal. No epidural or paraspinal hematoma.  IMPRESSION: No intracranial abnormality.  No acute bony abnormality in the cervical spine.   Electronically Signed   By: Rolm Baptise M.D.   On: 03/25/2015 15:06    Lumbosacral Imaging: Lumbar MR wo contrast:  Results for orders placed during the hospital encounter of 11/21/07  MR Lumbar Spine Wo Contrast   Narrative Clinical Data:  Neck pain.  Low back pain.  Bilateral shoulder and arm pain.  Left  arm weakness.  Bilateral leg weakness.   MRI CERVICAL SPINE WITHOUT CONTRAST   Technique: Multiplanar and multiecho pulse sequences of the cervical spine, to include the craniocervical junction and cervicothoracic junction, were obtained according to standard protocol without  intravenous contrast.   Comparison:  None   Findings:  ]   Alignment of the spine is normal.  There is no abnormality at the foramen magnum or C1-2.  The intervertebral discs throughout the cervical region are normal.  The spinal canal is widely patent. Ample subarachnoid space surrounds the cord.  No abnormal cord signal is seen.  No osseous or articular pathology.   IMPRESSION: Normal MRI of the cervical spine   MRI LUMBAR SPINE WITHOUT CONTRAST   Technique: Multiplanar and multiecho pulse sequences of the lumbar spine were obtained  without intravenous contrast.   Comparison: None   Findings:  There is no abnormality at L4-5 or above.  The discs are unremarkable.  The canal and foramina are widely patent.  The distal cord and conus are normal.   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  The disc is degenerated and shows a shallow protrusion.  The central canal is sufficiently patent, but there is bilateral neural foraminal stenosis that could effect either or both L5 nerve roots.   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  Provider: Mancel Bale    Knee Imaging:  Knee-R DG 4 views:  Results for orders placed during the hospital encounter of 12/27/14  DG Knee Complete 4 Views Right   Narrative CLINICAL DATA:  Right knee pain for 1 year, worse in the last week. Anterior knee pain for 2 days. Previously posterior knee pain and history Baker cyst.  EXAM: RIGHT KNEE - COMPLETE 4+ VIEW  COMPARISON:  None.  FINDINGS: [place] views are suboptimal due to positioning. No acute fracture, dislocation, or knee joint effusion is identified. There is mild medial and lateral compartment joint space narrowing. No lytic or blastic osseous lesion or soft tissue abnormality is identified.  IMPRESSION: Mild medial and lateral compartment joint space narrowing. No  acute osseous abnormality.   Electronically Signed   By: Logan Bores   On: 01/01/2015 12:00     Note: Available results from prior imaging studies were reviewed.        ROS  Cardiovascular History: High blood pressure and Blood thinners:  Anticoagulant Pulmonary or Respiratory History: No reported pulmonary signs or symptoms such as wheezing and difficulty taking a deep full breath (Asthma), difficulty blowing air out (Emphysema), coughing up mucus (Bronchitis), persistent dry cough, or temporary stoppage of breathing during sleep Neurological History: Stroke (Residual deficits or weakness:  ) Review of Past Neurological Studies:  Results for orders placed or performed during the hospital encounter of 02/19/17  CT Head Wo Contrast   Narrative   CLINICAL DATA:  Bilateral upper extremity numbness and weakness since last night. Chronic right-sided deficits from a previous stroke, worse tonight.  EXAM: CT HEAD WITHOUT CONTRAST  TECHNIQUE: Contiguous axial images were obtained from the base of the skull through the vertex without intravenous contrast.  COMPARISON:  04/11/2015.  FINDINGS: Brain: Progressive enlargement of the ventricles and subarachnoid spaces. Interval old left corona radiata and external capsule infarct. Interval old right cerebellar hemisphere infarct. No intracranial hemorrhage, mass lesion or CT evidence of acute infarction.  Vascular: No hyperdense vessel or unexpected calcification.  Skull:  Normal. Negative for fracture or focal lesion.  Sinuses/Orbits: Unremarkable.  Other: None.  IMPRESSION: 1. No acute abnormality. 2. Mildly progressive diffuse cerebral atrophy. 3. Interval old left cerebral and right cerebellar infarcts, as described above.   Electronically Signed   By: Claudie Revering M.D.   On: 02/19/2017 19:41   Results for orders placed or performed during the hospital encounter of 03/25/15  EEG   Narrative   Greta Doom,  MD     03/27/2015 10:06 AM History: 52 yo F s/p cardiac arrest.   Sedation: Propofol  Technique: This is a 19 channel routine scalp EEG performed at  the bedside with bipolar and monopolar montages arranged in  accordance to the international 10/20 system of electrode  placement. One channel was dedicated to EKG recording.    Background: The background consists of intermixed alpha and beta  activities with mild generalized delta seen at times. There is  marked movement artifact during most of this study. There is a  moderately well defined posterior dominant rhythm of 8 Hz. Sleep  is recorded.   Photic stimulation: Physiologic driving is not performed  EEG Abnormalities: 1) mild generalized delta during wakefullness  Clinical Interpretation: This EEG is mildly abnormal consistent  with a mld generalized non-specific cerebral  dysfunction(encephalopathy) as can bee seen with sedating  medications among other causes.There was no seizure or seizure  predisposition recorded on this study.   Roland Rack, MD [facility] [phone]  If 7pm- 7am, please page neurology on call as listed in [place].    Psychological-Psychiatric History: No reported psychological or psychiatric signs or symptoms such as difficulty sleeping, anxiety, depression, delusions or hallucinations (schizophrenial), mood swings (bipolar disorders) or suicidal ideations or attempts Gastrointestinal History: Reflux or heatburn Genitourinary History: No reported renal or genitourinary signs or symptoms such as difficulty voiding or producing urine, peeing blood, non-functioning kidney, kidney stones, difficulty emptying the bladder, difficulty controlling the flow of urine, or chronic kidney disease Hematological History: No reported hematological signs or symptoms such as prolonged bleeding, low or poor functioning platelets, bruising or bleeding easily, hereditary bleeding problems, low energy levels  due to low hemoglobin or being anemic Endocrine History: No reported endocrine signs or symptoms such as high or low blood sugar, rapid heart rate due to high thyroid levels, obesity or weight gain due to slow thyroid or thyroid disease Rheumatologic History: Joint swelling and pain associated with red skin patches (Psoriatic arthitis) Musculoskeletal History: Negative for myasthenia gravis, muscular dystrophy, multiple sclerosis or malignant hyperthermia Work History: Disabled  Allergies  Ms. Hockley is allergic to iodinated diagnostic agents; toradol [ketorolac tromethamine]; tramadol; and zofran [ondansetron hcl].  Laboratory Chemistry  Inflammation Markers Lab Results  Component Value Date   ESRSEDRATE 19 11/30/2017   (CRP: Acute Phase) (ESR: Chronic Phase) Renal Function Markers Lab Results  Component Value Date   BUN 19 11/30/2017   CREATININE 1.01 (H) 11/30/2017   GFRAA >60 11/30/2017   GFRNONAA >60 11/30/2017   Hepatic Function Markers Lab Results  Component Value Date   AST 23 11/30/2017   ALT 18 11/30/2017   ALBUMIN 4.0 11/30/2017   ALKPHOS 68 11/30/2017   Electrolytes Lab Results  Component Value Date   NA 138 11/30/2017   K 3.6 11/30/2017   CL 105 11/30/2017   CALCIUM 9.1 11/30/2017   MG 2.2 11/30/2017   Neuropathy Markers No results found for: [id number] Bone Pathology Markers Lab Results  Component Value Date   ALKPHOS 68 11/30/2017  CALCIUM 9.1 11/30/2017   Coagulation Parameters Lab Results  Component Value Date   INR 0.97 11/26/2015   LABPROT 13.1 11/26/2015   APTT 26 11/26/2015   PLT 274 02/19/2017   Cardiovascular Markers Lab Results  Component Value Date   BNP 20.8 09/21/2015   HGB 14.6 02/19/2017   HCT 43.8 02/19/2017   Note: Lab results reviewed.  [place]  Drug: Ms. Sahakian  reports that she does not use drugs. Alcohol:  reports that she does not drink alcohol. Tobacco:  reports that she has never smoked. She has never used smokeless  tobacco. 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 Family: family history includes Allergies in her daughter, son, and son; Asthma in her father and mother; Breast cancer in her maternal grandmother and paternal aunt; Cancer in her maternal grandmother; Depression in her mother; Diabetes in her father; Early death in her maternal grandmother and paternal grandmother; Heart disease in her son; Hyperlipidemia in her father and mother; Hypertension in her father and mother; Kidney disease in her father.  Past Surgical History:  Procedure Laterality Date  . CARDIAC CATHETERIZATION N/A 03/27/2015   Procedure: Left Heart Cath and Coronary Angiography;  Surgeon: Troy Sine, MD;  LAD 10%/30%, CFX and RCA OK, EF normal  . CENTRAL LINE  03/25/2015      . COLONOSCOPY WITH PROPOFOL N/A 10/01/2015   Procedure: COLONOSCOPY WITH PROPOFOL;  Surgeon: Josefine Class, MD;  Location: [facility] ENDOSCOPY;  Service: Endoscopy;  Laterality: N/A;  . EP IMPLANTABLE DEVICE N/A 04/11/2015   Procedure: ICD Implant;  Surgeon: Deboraha Sprang, MD;  [facility] ICD, serial number [id number]  . EP IMPLANTABLE DEVICE N/A 07/16/2015   Procedure: Lead Revision/Repair;  Surgeon: Will Meredith Leeds, MD;  Location: [place] [facility];  Service: Cardiovascular;  Laterality: N/A;  . ESOPHAGOGASTRODUODENOSCOPY (EGD) WITH PROPOFOL N/A 10/01/2015   Procedure: ESOPHAGOGASTRODUODENOSCOPY (EGD) WITH PROPOFOL;  Surgeon: Josefine Class, MD;  Location: [facility] ENDOSCOPY;  Service: Endoscopy;  Laterality: N/A;  . HAND SURGERY Left   . OVARIAN CYST REMOVAL  Left   . TUBAL LIGATION  1998   Active Ambulatory Problems    Diagnosis Date Noted  . DDD (degenerative disc disease), lumbar 01/28/2015  . Lumbar facet arthropathy 01/28/2015  . Sacroiliac joint disease 01/28/2015  . Cardiac arrest; V. fib, [employer] Jude ICD 03/25/2015  . Essential hypertension 09/13/2015  . Hyperlipidemia 09/13/2015  . Cough   . Dyspnea   . Deep vein thrombosis (DVT) ([place]) 09/13/2015  . Normal coronary arteries 04/09/2015  . Obesity (BMI 35.0-39.9 without comorbidity) 04/09/2015  . Pulmonary nodule seen on imaging study 04/09/2015  . Chronic anticoagulation 04/09/2015  . CAD (coronary artery disease) 04/16/2015  . Seizure disorder ([place]) 04/16/2015  . Heart failure ([place]) 04/16/2015  . 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 Ventricular tachycardia ([place]) 07/16/2015  . Left-sided weakness 02/20/2017  . Anxiety 10/12/2015  . Asthma 05/06/2016  . Atypical squamous cell changes of undetermined significance (ASCUS) on cervical cytology with positive high risk human papilloma virus (HPV) 10/15/2015  . Bilateral carpal tunnel syndrome 12/28/2015  . Chronic back pain 01/26/2017  . Diverticulosis of large intestine without hemorrhage 09/19/2016  . Food insecurity 11/02/2016  . Frequent  falls 10/09/2017  . Gastroesophageal reflux disease without esophagitis 10/12/2015  . Hernia, incisional 05/06/2016  . Hiatal hernia 09/01/2016  . History of DVT (deep vein thrombosis) 11/30/2017  . Intramural leiomyoma of uterus 10/14/2015  . Ischemic stroke ([place]) 10/19/2016  . Mental disorder follow-up 10/09/2017  . Migraine headache 09/01/2016  . Moderate episode of recurrent major depressive disorder ([place]) 12/10/2015  . Myalgia 06/04/2017  . Right hip pain 07/03/2017  . 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 Transient neurological symptoms 12/04/2016  . Urinary incontinence 11/16/2017  . Chronic bilateral low back  pain with bilateral  (Primary Area of Pain) (R>L) 11/30/2017  . Chronic pain of both lower extremities (Secondary Area of Pain)(R>L) 11/30/2017  . Chronic pain syndrome 11/30/2017  . Long term current use of opiate analgesic 11/30/2017  . Pharmacologic therapy 11/30/2017  . Disorder of skeletal system 11/30/2017  . Problems influencing health status 11/30/2017   Resolved Ambulatory Problems    Diagnosis Date Noted  . DDD (degenerative disc disease), lumbosacral 01/01/2015  . DJD (degenerative joint disease)sacroiliac joint 01/01/2015  . Sacroiliac joint disease 01/01/2015  . Acute pulmonary embolism ([place])   . Troponin level elevated 04/09/2015   Past Medical History:  Diagnosis Date  . Allergy   . Anxiety   . Asthma 05/06/2016  . Coronary artery disease   . DDD (degenerative disc disease), cervical   . Depression   . Diverticulosis   . DVT (deep venous thrombosis) ([place])   . GERD (gastroesophageal reflux disease)   . Heart failure ([place])   . High cholesterol   . Hyperlipidemia   . Hypertension   . 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 Migraine   . Obesity (BMI 30-39.9)   . Seizures ([place])   . Ventricular tachycardia ([place])    Constitutional Exam  General appearance: Well nourished, well developed, and well hydrated. In no apparent acute distress Vitals:   11/30/17 1015  BP: 119/90  Pulse: 64  Resp: 16  Temp: 97.8 F (36.6 C)  TempSrc: Oral  SpO2: 98%  Weight: 205 lb (93 kg)  Height: '5\' 2"'  (1.575 m)   BMI Assessment: Estimated body mass index is 37.49 kg/m as calculated from the following:   Height as of this encounter: '5\' 2"'  (1.575 m).   Weight as of this encounter: 205 lb (93 kg).  BMI interpretation table: BMI level Category Range association with higher incidence of chronic pain  <18 kg/m2 Underweight   18.5-24.9 kg/m2 Ideal body weight   25-29.9 kg/m2 Overweight Increased incidence by 20%  30-34.9 kg/m2 Obese (Class I) Increased incidence by 68%   35-39.9 kg/m2 Severe obesity (Class II) Increased incidence by 136%  >40 kg/m2 Extreme obesity (Class III) Increased incidence by 254%   BMI Readings from Last 4 Encounters:  11/30/17 37.49 kg/m  02/20/17 41.70 kg/m  11/20/15 39.14 kg/m  10/01/15 37.13 kg/m   Wt Readings from Last 4 Encounters:  11/30/17 205 lb (93 kg)  02/20/17 220 lb 11.2 oz (100.1 kg)  11/20/15 214 lb (97.1 kg)  10/01/15 203 lb (92.1 kg)  Psych/Mental status: Alert, oriented x 3 (person, place, & time)       Eyes: PERLA Respiratory: No evidence of acute respiratory distress  Cervical Spine Exam  Inspection: No masses, redness, or swelling Alignment: Symmetrical Functional ROM: Unrestricted ROM      Stability: No instability detected Muscle strength & Tone: Functionally intact Sensory: Unimpaired Palpation: No palpable anomalies  Upper Extremity (UE) Exam    Side: Right upper extremity  Side: Left upper extremity  Inspection: No masses, redness, swelling, or asymmetry. No contractures  Inspection: No masses, redness, swelling, or asymmetry. No contractures  Functional ROM: Unrestricted ROM          Functional ROM: Unrestricted ROM          Muscle strength & Tone: Functionally intact  Muscle strength & Tone: Functionally intact  Sensory: Unimpaired  Sensory: Unimpaired  Palpation: No palpable anomalies              Palpation: No palpable anomalies              Specialized Test(s): Deferred         Specialized Test(s): Deferred          Thoracic Spine Exam  Inspection: No masses, redness, or swelling Alignment: Symmetrical Functional ROM: Unrestricted ROM Stability: No instability detected Sensory: Unimpaired Muscle strength & Tone: No palpable anomalies  Lumbar Spine Exam  Inspection: No masses, redness, or swelling Alignment: Symmetrical Functional ROM: Unrestricted ROM      Stability: No instability detected Muscle strength & Tone: Functionally intact Sensory:  Unimpaired Palpation: Complains of area being tender to palpation       Provocative Tests: Lumbar Hyperextension and rotation test: Positive bilaterally for facet joint pain.bilateral leg raises positive less than 45 Patrick's Maneuver: Negative                    Gait & Posture Assessment  Ambulation: Patient ambulates using a cane Gait: Antalgic Posture: WNL   Lower Extremity Exam    Side: Right lower extremity  Side: Left lower extremity  Inspection: No masses, redness, swelling, or asymmetry. No contractures  Inspection: No masses, redness, swelling, or asymmetry. No contractures  Functional ROM: Unrestricted ROM          Functional ROM: Unrestricted ROM          Muscle strength & Tone: Functionally intact  Muscle strength & Tone: Functionally intact  Sensory: Unimpaired  Sensory: Unimpaired  Palpation: No palpable anomalies  Palpation: No palpable anomalies   Assessment  Primary Diagnosis & Pertinent Problem List: Diagnoses of Chronic bilateral low back pain with bilateral  (Primary Area of Pain) (R>L), Chronic pain of both lower extremities (Secondary Area of Pain)(R>L), Chronic pain syndrome, Long term current use of opiate analgesic, Pharmacologic therapy, Disorder of skeletal system, and Problems influencing health status were pertinent to this visit.  Visit Diagnosis: 1. Chronic bilateral low back pain with bilateral  (Primary Area of Pain) (R>L)   2. Chronic pain of both lower extremities (Secondary Area of Pain)(R>L)   3. Chronic pain syndrome   4. Long term current use of opiate analgesic   5. Pharmacologic therapy   6. Disorder of skeletal system   7. Problems influencing health status    Plan of Care  Initial treatment plan:  Please be advised that as per protocol, today's visit has been an evaluation only. We have not taken over the patient's controlled substance management.  Problem-specific plan: No problem-specific Assessment & Plan notes found for this  encounter.  Ordered Lab-work, Procedure(s), Referral(s), & Consult(s): Orders Placed This Encounter  Procedures  . DG Lumbar Spine Complete W/Bend  . Compliance Drug Analysis, Ur  . 25-Hydroxyvitamin D Lcms D2+D3  . C-reactive protein  . VITAMIN D 25 Hydroxy (Vit-D Deficiency, Fractures)  . Sedimentation rate  . Vitamin B12  . Magnesium  .  Comp. Metabolic Panel (12)  . Drug Screen 10 W/Conf, Serum   Pharmacotherapy: Medications ordered:  No orders of the defined types were placed in this encounter.  Medications administered during this visit: [name] had no medications administered during this visit.   Pharmacotherapy under consideration:  Opioid Analgesics: The patient was informed that there is no guarantee that she would be a candidate for opioid analgesics. The decision will be made following CDC guidelines. This decision will be based on the results of diagnostic studies, as well as Ms. Parlee's risk profile.  Membrane stabilizer: To be determined at a later time Muscle relaxant: To be determined at a later time NSAID: To be determined at a later time Other analgesic(s): To be determined at a later time   Interventional therapies under consideration: Ms. Hine was informed that there is no guarantee that she would be a candidate for interventional therapies. The decision will be based on the results of diagnostic studies, as well as Ms. Gehring's risk profile.  Possible procedure(s): Diagnostic bilateral lumbar epidural steroid injection Diagnostic bilateral lumbar facet nerve block Possible bilateral facet radiofrequency ablation   Provider-requested follow-up: Return for 2nd Visit, w/ Dr. Dossie Arbour.  No future appointments.  Primary Care Physician: [facility] [employer] [facility] Location: [facility] Note by:  Date: 11/30/2017; Time: 3:43 PM  Pain Score Disclaimer: We use the NRS-11 scale. This is a self-reported, subjective measurement of pain  severity with only modest accuracy. It is used primarily to identify changes within a particular patient. It must be understood that outpatient pain scales are significantly less accurate that those used for research, where they can be applied under ideal controlled circumstances with minimal exposure to variables. In reality, the score is likely to be a combination of pain intensity and pain affect, where pain affect describes the degree of emotional arousal or changes in action readiness caused by the sensory experience of pain. Factors such as social and work situation, setting, emotional state, anxiety levels, expectation, and prior pain experience may influence pain perception and show large inter-individual differences that may also be affected by time variables.  Patient instructions provided during this appointment: Patient Instructions   You have been instructed to get labs and xrays____________________________________________________________________________________________  Appointment Policy Summary  It is our goal and responsibility to provide the medical community with assistance in the evaluation and management of patients with chronic pain. Unfortunately our resources are limited. Because we do not have an unlimited amount of time, or available appointments, we are required to closely monitor and manage their use. The following rules exist to maximize their use:  Patient's responsibilities: 1. Punctuality:  At what time should I arrive? You should be physically present in our office 30 minutes before your scheduled appointment. Your scheduled appointment is with your assigned healthcare provider. However, it takes 5-10 minutes to be "checked-in", and another 15 minutes for the nurses to do the admission. If you arrive to our office at the time you were given for your appointment, you will end up being at least 20-25 minutes late to your appointment with the provider. 2. Tardiness:  What  happens if I arrive only a few minutes after my scheduled appointment time? You will need to reschedule your appointment. The cutoff is your appointment time. This is why it is so important that you arrive at least 30 minutes before that appointment. If you have an appointment scheduled for 10:00 AM and you arrive at 10:01, you will be  required to reschedule your appointment.  3. Plan ahead:  Always assume that you will encounter traffic on your way in. Plan for it. If you are dependent on a driver, make sure they understand these rules and the need to arrive early. 4. Other appointments and responsibilities:  Avoid scheduling any other appointments before or after your pain clinic appointments.  5. Be prepared:  Write down everything that you need to discuss with your healthcare provider and give this information to the admitting nurse. Write down the medications that you will need refilled. Bring your pills and bottles (even the empty ones), to all of your appointments, except for those where a procedure is scheduled. 6. No children or pets:  Find someone to take care of them. It is not appropriate to bring them in. 7. Scheduling changes:  We request "advanced notification" of any changes or cancellations. 8. Advanced notification:  Defined as a time period of more than 24 hours prior to the originally scheduled appointment. This allows for the appointment to be offered to other patients. 9. Rescheduling:  When a visit is rescheduled, it will require the cancellation of the original appointment. For this reason they both fall within the category of "Cancellations".  10. Cancellations:  They require advanced notification. Any cancellation less than 24 hours before the  appointment will be recorded as a "No Show". 11. No Show:  Defined as an unkept appointment where the patient failed to notify or declare to the practice their intention or inability to keep the appointment.  Corrective process  for repeat offenders:  1. Tardiness: Three (3) episodes of rescheduling due to late arrivals will be recorded as one (1) "No Show". 2. Cancellation or reschedule: Three (3) cancellations or rescheduling will be recorded as one (1) "No Show". 3. "No Shows": Three (3) "No Shows" within a 12 month period will result in discharge from the practice. ____________________________________________________________________________________________  ____________________________________________________________________________________________  Pain Scale  Introduction: The pain score used by this practice is the Verbal Numerical Rating Scale (VNRS-11). This is an 11-point scale. It is for adults and children 10 years or older. There are significant differences in how the pain score is reported, used, and applied. Forget everything you learned in the past and learn this scoring system.  General Information: The scale should reflect your current level of pain. Unless you are specifically asked for the level of your worst pain, or your average pain. If you are asked for one of these two, then it should be understood that it is over the past 24 hours.  Basic Activities of Daily Living (ADL): Personal hygiene, dressing, eating, transferring, and using restroom.  Instructions: Most patients tend to report their level of pain as a combination of two factors, their physical pain and their psychosocial pain. This last one is also known as "suffering" and it is reflection of how physical pain affects you socially and psychologically. From now on, report them separately. From this point on, when asked to report your pain level, report only your physical pain. Use the following table for reference.  Pain Clinic Pain Levels (0-5/10)  Pain Level Score  Description  No Pain 0   Mild pain 1 Nagging, annoying, but does not interfere with basic activities of daily living (ADL). Patients are able to eat, bathe, get dressed,  toileting (being able to get on and off the toilet and perform personal hygiene functions), transfer (move in and out of bed or a chair without assistance), and maintain continence (  able to control bladder and bowel functions). Blood pressure and heart rate are unaffected. A normal heart rate for a healthy adult ranges from 60 to 100 bpm (beats per minute).   Mild to moderate pain 2 Noticeable and distracting. Impossible to hide from other people. More frequent flare-ups. Still possible to adapt and function close to normal. It can be very annoying and may have occasional stronger flare-ups. With discipline, patients may get used to it and adapt.   Moderate pain 3 Interferes significantly with activities of daily living (ADL). It becomes difficult to feed, bathe, get dressed, get on and off the toilet or to perform personal hygiene functions. Difficult to get in and out of bed or a chair without assistance. Very distracting. With effort, it can be ignored when deeply involved in activities.   Moderately severe pain 4 Impossible to ignore for more than a few minutes. With effort, patients may still be able to manage work or participate in some social activities. Very difficult to concentrate. Signs of autonomic nervous system discharge are evident: dilated pupils (mydriasis); mild sweating (diaphoresis); sleep interference. Heart rate becomes elevated (>115 bpm). Diastolic blood pressure (lower number) rises above 100 mmHg. Patients find relief in laying down and not moving.   Severe pain 5 Intense and extremely unpleasant. Associated with frowning face and frequent crying. Pain overwhelms the senses.  Ability to do any activity or maintain social relationships becomes significantly limited. Conversation becomes difficult. Pacing back and forth is common, as getting into a comfortable position is nearly impossible. Pain wakes you up from deep sleep. Physical signs will be obvious: pupillary dilation;  increased sweating; goosebumps; brisk reflexes; cold, clammy hands and feet; nausea, vomiting or dry heaves; loss of appetite; significant sleep disturbance with inability to fall asleep or to remain asleep. When persistent, significant weight loss is observed due to the complete loss of appetite and sleep deprivation.  Blood pressure and heart rate becomes significantly elevated. Caution: If elevated blood pressure triggers a pounding headache associated with blurred vision, then the patient should immediately seek attention at an urgent or emergency care unit, as these may be signs of an impending stroke.    Emergency Department Pain Levels (6-10/10)  Emergency Room Pain 6 Severely limiting. Requires emergency care and should not be seen or managed at an outpatient pain management facility. Communication becomes difficult and requires great effort. Assistance to reach the emergency department may be required. Facial flushing and profuse sweating along with potentially dangerous increases in heart rate and blood pressure will be evident.   Distressing pain 7 Self-care is very difficult. Assistance is required to transport, or use restroom. Assistance to reach the emergency department will be required. Tasks requiring coordination, such as bathing and getting dressed become very difficult.   Disabling pain 8 Self-care is no longer possible. At this level, pain is disabling. The individual is unable to do even the most "basic" activities such as walking, eating, bathing, dressing, transferring to a bed, or toileting. Fine motor skills are lost. It is difficult to think clearly.   Incapacitating pain 9 Pain becomes incapacitating. Thought processing is no longer possible. Difficult to remember your own name. Control of movement and coordination are lost.   The worst pain imaginable 10 At this level, most patients pass out from pain. When this level is reached, collapse of the autonomic nervous system  occurs, leading to a sudden drop in blood pressure and heart rate. This in turn results in a temporary  and dramatic drop in blood flow to the brain, leading to a loss of consciousness. Fainting is one of the body's self defense mechanisms. Passing out puts the brain in a calmed state and causes it to shut down for a while, in order to begin the healing process.    Summary: 1. Refer to this scale when providing [place] with your pain level. 2. Be accurate and careful when reporting your pain level. This will help with your care. 3. Over-reporting your pain level will lead to loss of credibility. 4. Even a level of 1/10 means that there is pain and will be treated at our facility. 5. High, inaccurate reporting will be documented as "Symptom Exaggeration", leading to loss of credibility and suspicions of possible secondary gains such as obtaining more narcotics, or wanting to appear disabled, for fraudulent reasons. 6. Only pain levels of 5 or below will be seen at our facility. 7. Pain levels of 6 and above will be sent to the Emergency Department and the appointment cancelled. ____________________________________________________________________________________________

## 2017-11-30 NOTE — Patient Instructions (Addendum)
You have been instructed to get labs and xrays____________________________________________________________________________________________  Appointment Policy Summary  It is our goal and responsibility to provide the medical community with assistance in the evaluation and management of patients with chronic pain. Unfortunately our resources are limited. Because we do not have an unlimited amount of time, or available appointments, we are required to closely monitor and manage their use. The following rules exist to maximize their use:  Patient's responsibilities: 1. Punctuality:  At what time should I arrive? You should be physically present in our office 30 minutes before your scheduled appointment. Your scheduled appointment is with your assigned healthcare provider. However, it takes 5-10 minutes to be "checked-in", and another 15 minutes for the nurses to do the admission. If you arrive to our office at the time you were given for your appointment, you will end up being at least 20-25 minutes late to your appointment with the provider. 2. Tardiness:  What happens if I arrive only a few minutes after my scheduled appointment time? You will need to reschedule your appointment. The cutoff is your appointment time. This is why it is so important that you arrive at least 30 minutes before that appointment. If you have an appointment scheduled for 10:00 AM and you arrive at 10:01, you will be required to reschedule your appointment.  3. Plan ahead:  Always assume that you will encounter traffic on your way in. Plan for it. If you are dependent on a driver, make sure they understand these rules and the need to arrive early. 4. Other appointments and responsibilities:  Avoid scheduling any other appointments before or after your pain clinic appointments.  5. Be prepared:  Write down everything that you need to discuss with your healthcare provider and give this information to the admitting nurse. Write  down the medications that you will need refilled. Bring your pills and bottles (even the empty ones), to all of your appointments, except for those where a procedure is scheduled. 6. No children or pets:  Find someone to take care of them. It is not appropriate to bring them in. 7. Scheduling changes:  We request "advanced notification" of any changes or cancellations. 8. Advanced notification:  Defined as a time period of more than 24 hours prior to the originally scheduled appointment. This allows for the appointment to be offered to other patients. 9. Rescheduling:  When a visit is rescheduled, it will require the cancellation of the original appointment. For this reason they both fall within the category of "Cancellations".  10. Cancellations:  They require advanced notification. Any cancellation less than 24 hours before the  appointment will be recorded as a "No Show". 11. No Show:  Defined as an unkept appointment where the patient failed to notify or declare to the practice their intention or inability to keep the appointment.  Corrective process for repeat offenders:  1. Tardiness: Three (3) episodes of rescheduling due to late arrivals will be recorded as one (1) "No Show". 2. Cancellation or reschedule: Three (3) cancellations or rescheduling will be recorded as one (1) "No Show". 3. "No Shows": Three (3) "No Shows" within a 12 month period will result in discharge from the practice. ____________________________________________________________________________________________  ____________________________________________________________________________________________  Pain Scale  Introduction: The pain score used by this practice is the Verbal Numerical Rating Scale (VNRS-11). This is an 11-point scale. It is for adults and children 10 years or older. There are significant differences in how the pain score is reported, used, and applied. Forget  everything you learned in the past  and learn this scoring system.  General Information: The scale should reflect your current level of pain. Unless you are specifically asked for the level of your worst pain, or your average pain. If you are asked for one of these two, then it should be understood that it is over the past 24 hours.  Basic Activities of Daily Living (ADL): Personal hygiene, dressing, eating, transferring, and using restroom.  Instructions: Most patients tend to report their level of pain as a combination of two factors, their physical pain and their psychosocial pain. This last one is also known as "suffering" and it is reflection of how physical pain affects you socially and psychologically. From now on, report them separately. From this point on, when asked to report your pain level, report only your physical pain. Use the following table for reference.  Pain Clinic Pain Levels (0-5/10)  Pain Level Score  Description  No Pain 0   Mild pain 1 Nagging, annoying, but does not interfere with basic activities of daily living (ADL). Patients are able to eat, bathe, get dressed, toileting (being able to get on and off the toilet and perform personal hygiene functions), transfer (move in and out of bed or a chair without assistance), and maintain continence (able to control bladder and bowel functions). Blood pressure and heart rate are unaffected. A normal heart rate for a healthy adult ranges from 60 to 100 bpm (beats per minute).   Mild to moderate pain 2 Noticeable and distracting. Impossible to hide from other people. More frequent flare-ups. Still possible to adapt and function close to normal. It can be very annoying and may have occasional stronger flare-ups. With discipline, patients may get used to it and adapt.   Moderate pain 3 Interferes significantly with activities of daily living (ADL). It becomes difficult to feed, bathe, get dressed, get on and off the toilet or to perform personal hygiene functions.  Difficult to get in and out of bed or a chair without assistance. Very distracting. With effort, it can be ignored when deeply involved in activities.   Moderately severe pain 4 Impossible to ignore for more than a few minutes. With effort, patients may still be able to manage work or participate in some social activities. Very difficult to concentrate. Signs of autonomic nervous system discharge are evident: dilated pupils (mydriasis); mild sweating (diaphoresis); sleep interference. Heart rate becomes elevated (>115 bpm). Diastolic blood pressure (lower number) rises above 100 mmHg. Patients find relief in laying down and not moving.   Severe pain 5 Intense and extremely unpleasant. Associated with frowning face and frequent crying. Pain overwhelms the senses.  Ability to do any activity or maintain social relationships becomes significantly limited. Conversation becomes difficult. Pacing back and forth is common, as getting into a comfortable position is nearly impossible. Pain wakes you up from deep sleep. Physical signs will be obvious: pupillary dilation; increased sweating; goosebumps; brisk reflexes; cold, clammy hands and feet; nausea, vomiting or dry heaves; loss of appetite; significant sleep disturbance with inability to fall asleep or to remain asleep. When persistent, significant weight loss is observed due to the complete loss of appetite and sleep deprivation.  Blood pressure and heart rate becomes significantly elevated. Caution: If elevated blood pressure triggers a pounding headache associated with blurred vision, then the patient should immediately seek attention at an urgent or emergency care unit, as these may be signs of an impending stroke.    Emergency Department Pain  Levels (6-10/10)  Emergency Room Pain 6 Severely limiting. Requires emergency care and should not be seen or managed at an outpatient pain management facility. Communication becomes difficult and requires great  effort. Assistance to reach the emergency department may be required. Facial flushing and profuse sweating along with potentially dangerous increases in heart rate and blood pressure will be evident.   Distressing pain 7 Self-care is very difficult. Assistance is required to transport, or use restroom. Assistance to reach the emergency department will be required. Tasks requiring coordination, such as bathing and getting dressed become very difficult.   Disabling pain 8 Self-care is no longer possible. At this level, pain is disabling. The individual is unable to do even the most "basic" activities such as walking, eating, bathing, dressing, transferring to a bed, or toileting. Fine motor skills are lost. It is difficult to think clearly.   Incapacitating pain 9 Pain becomes incapacitating. Thought processing is no longer possible. Difficult to remember your own name. Control of movement and coordination are lost.   The worst pain imaginable 10 At this level, most patients pass out from pain. When this level is reached, collapse of the autonomic nervous system occurs, leading to a sudden drop in blood pressure and heart rate. This in turn results in a temporary and dramatic drop in blood flow to the brain, leading to a loss of consciousness. Fainting is one of the body's self defense mechanisms. Passing out puts the brain in a calmed state and causes it to shut down for a while, in order to begin the healing process.    Summary: 1. Refer to this scale when providing [place] with your pain level. 2. Be accurate and careful when reporting your pain level. This will help with your care. 3. Over-reporting your pain level will lead to loss of credibility. 4. Even a level of 1/10 means that there is pain and will be treated at our facility. 5. High, inaccurate reporting will be documented as "Symptom Exaggeration", leading to loss of credibility and suspicions of possible secondary gains such as obtaining more  narcotics, or wanting to appear disabled, for fraudulent reasons. 6. Only pain levels of 5 or below will be seen at our facility. 7. Pain levels of 6 and above will be sent to the Emergency Department and the appointment cancelled. ____________________________________________________________________________________________

## 2017-12-01 LAB — VITAMIN D 25 HYDROXY (VIT D DEFICIENCY, FRACTURES): Vit D, 25-Hydroxy: 19.3 ng/mL — ABNORMAL LOW (ref 30.0–100.0)

## 2017-12-01 NOTE — Progress Notes (Signed)
Results were reviewed and found to be: abnormal  No acute injury or pathology identified  Review would suggest interventional pain management techniques may be of benefit

## 2017-12-02 LAB — DRUG SCREEN 10 W/CONF, SERUM
Amphetamines, IA: NEGATIVE ng/mL
BARBITURATES, IA: NEGATIVE ug/mL
BENZODIAZEPINES, IA: NEGATIVE ng/mL
COCAINE & METABOLITE, IA: NEGATIVE ng/mL
Methadone, IA: NEGATIVE ng/mL
Opiates, IA: NEGATIVE ng/mL
Oxycodones, IA: NEGATIVE ng/mL
Phencyclidine, IA: NEGATIVE ng/mL
Propoxyphene, IA: NEGATIVE ng/mL
THC(Marijuana) Metabolite, IA: NEGATIVE ng/mL

## 2017-12-15 DIAGNOSIS — Z981 Arthrodesis status: Secondary | ICD-10-CM | POA: Insufficient documentation

## 2017-12-15 DIAGNOSIS — M5416 Radiculopathy, lumbar region: Secondary | ICD-10-CM

## 2017-12-15 DIAGNOSIS — M5417 Radiculopathy, lumbosacral region: Secondary | ICD-10-CM | POA: Insufficient documentation

## 2017-12-15 DIAGNOSIS — M48061 Spinal stenosis, lumbar region without neurogenic claudication: Secondary | ICD-10-CM | POA: Insufficient documentation

## 2017-12-15 DIAGNOSIS — M7918 Myalgia, other site: Secondary | ICD-10-CM

## 2017-12-15 DIAGNOSIS — M792 Neuralgia and neuritis, unspecified: Secondary | ICD-10-CM | POA: Insufficient documentation

## 2017-12-15 DIAGNOSIS — G8929 Other chronic pain: Secondary | ICD-10-CM | POA: Insufficient documentation

## 2017-12-15 DIAGNOSIS — M4310 Spondylolisthesis, site unspecified: Secondary | ICD-10-CM | POA: Insufficient documentation

## 2017-12-15 DIAGNOSIS — M532X7 Spinal instabilities, lumbosacral region: Secondary | ICD-10-CM | POA: Insufficient documentation

## 2017-12-15 NOTE — Progress Notes (Signed)
Patient's Name: Claire Mcdonald  MRN: [id number]  Referring Provider: [facility]  DOB: 08-10-1966  PCP: [facility]  DOS: 12/16/2017  Note by: [name], MD  Service setting: Ambulatory outpatient  Specialty: Interventional Pain Management  Location: [facility] (AMB) Pain Management Facility    Patient type: Established   Primary Reason(s) for Visit: Encounter for evaluation before starting new chronic pain management plan of care (Level of risk: moderate) CC: Back Pain (low)  HPI  Claire Mcdonald is a 52 y.o. year old, female patient, who comes today for a follow-up evaluation to review the test results and decide on a treatment plan. She has DDD (degenerative disc disease), lumbar; Lumbar facet arthropathy; Sacroiliac joint disease; Cardiac arrest; V. fib, St. Jude ICD; Essential hypertension; Hyperlipidemia; Cough; Dyspnea; Deep vein thrombosis (DVT) ([place]); Normal coronary arteries; Obesity (BMI 35.0-39.9 without comorbidity); Pulmonary nodule seen on imaging study; Chronic anticoagulation ([place]); CAD (coronary artery disease); Seizure disorder ([place]); Heart failure ([place]); ICD (implantable cardioverter-defibrillator) lead failure; Ventricular tachycardia ([place]); Left-sided weakness; Anxiety; Asthma; Atypical squamous cell changes of undetermined significance (ASCUS) on cervical cytology with positive high risk human papilloma virus (HPV); Bilateral carpal tunnel syndrome; Diverticulosis of large intestine without hemorrhage; Food insecurity; Frequent falls; Gastroesophageal reflux disease without esophagitis; Hernia, incisional; Hiatal hernia; History of DVT (deep vein thrombosis); Intramural leiomyoma of uterus; Ischemic stroke ([place]); Mental disorder follow-up; Migraine headache; Moderate episode of recurrent major depressive disorder ([place]); Right hip pain; 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); Transient neurological symptoms; Urinary incontinence; Chronic low back  pain (Primary Area of Pain) (Bilateral) (R>L); Chronic lower extremity pain (Secondary Area of Pain) (Bilateral) (R>L); Chronic pain syndrome; Long term current use of opiate analgesic; Pharmacologic therapy; Disorder of skeletal system; Problems influencing health status; Grade 2-3 (13-16 mm) Anterolisthesis of L5 over S1; Lumbar foraminal stenosis (L5-S1) (Bilateral); Chronic lumbar radicular pain (S1) (Bilateral); Chronic musculoskeletal pain; Neurogenic pain; Spinal instability of lumbosacral region (L5-S1); Lumbosacral radiculopathy at S1; Vitamin D deficiency; History of Allergy to iodine; History of allergy to radiographic contrast media; and History of placement of internal cardiac defibrillator on their problem list. Her primarily concern today is the Back Pain (low)  Pain Assessment: Location: Lower Back Radiating: radiates down both legs to feet.  Onset: More than a month ago Duration: Chronic pain Quality: Throbbing, Aching Severity: 8 /10 (self-reported pain score)  Note: Reported level is inconsistent with clinical observations. Clinically the patient looks like a 4/10 A 4/10 is viewed as "Moderately Severe" and described as impossible to ignore for more than a few minutes. With effort, patients may still be able to manage work or participate in some social activities. Very difficult to concentrate. Signs of autonomic nervous system discharge are evident: dilated pupils (mydriasis); mild sweating (diaphoresis); sleep interference. Heart rate becomes elevated (>115 bpm). Diastolic blood pressure (lower number) rises above 100 mmHg. Patients find relief in laying down and not moving. Information on the proper use of the pain scale provided to the patient today. When using our objective Pain Scale, levels between 6 and 10/10 are said to belong in an emergency room, as it progressively worsens from a 6/10, described as severely limiting, requiring emergency care not usually available at an  outpatient pain management facility. At a 6/10 level, communication becomes difficult and requires great effort. Assistance to reach the emergency department may be required. Facial flushing and profuse sweating along with potentially dangerous increases in heart rate and blood pressure will be evident. Timing:  Constant Modifying factors: gabapentin, lying down  Claire Mcdonald comes in today for a follow-up visit after her initial evaluation on 11/30/2017. Today we went over the results of her tests. These were explained in "Layman's terms". During today's appointment we went over my diagnostic impression, as well as the proposed treatment plan.  According to the patient her primary area of pain is in her lower back. She admits this is related to a ground-level fall she suffered in 1998. She states that she fell on some oil. She denies any previous surgery. She admits that she was having lumbar facet nerve blocks by Dr.Gregory Crisp. She admits that the injections were effective for approximately one month period. She denies physical therapy which she has for her lower back. She denies any recent images.  Her second area of pain is in her lower extremities. She admits that the pain goes down the back of her leg to the bottom of her feet. She admits that she has numbness on the right side in the thigh area only. She does have weakness. She admits that right-sided weakness and was a result of a stroke that she suffered last year.  She is currently in physical therapy for her weakness.  In considering the treatment plan options, Claire Mcdonald was reminded that I no longer take patients for medication management only. I asked her to let me know if she had no intention of taking advantage of the interventional therapies, so that we could make arrangements to provide this space to someone interested. I also made it clear that undergoing interventional therapies for the purpose of getting pain medications is very  inappropriate on the part of a patient, and it will not be tolerated in this practice. This type of behavior would suggest true addiction and therefore it requires referral to an addiction specialist.   Further details on both, my assessment(s), as well as the proposed treatment plan, please see below.  Controlled Substance Pharmacotherapy Assessment REMS (Risk Evaluation and Mitigation Strategy)  Analgesic: none Highest recorded MME/day: 60 mg/day MME/day: 0 mg/day Pill Count: None expected due to no prior prescriptions written by our practice. Dewayne Shorter, RN  12/16/2017  8:35 AM  Signed Safety precautions to be maintained throughout the outpatient stay will include: orient to surroundings, keep bed in low position, maintain call bell within reach at all times, provide assistance with transfer out of bed and ambulation.   Pharmacokinetics: Liberation and absorption (onset of action): WNL Distribution (time to peak effect): WNL Metabolism and excretion (duration of action): WNL         Pharmacodynamics: Desired effects: Analgesia: Ms. Pappalardo reports >50% benefit. Functional ability: Patient reports that medication allows her to accomplish basic ADLs Clinically meaningful improvement in function (CMIF): Sustained CMIF goals met Perceived effectiveness: Described as relatively effective, allowing for increase in activities of daily living (ADL) Undesirable effects: Side-effects or Adverse reactions: None reported Monitoring: [place] PMP: Online review of the past 47-monthperiod previously conducted. Not applicable at this point since we have not taken over the patient's medication management yet. List of other Serum/Urine Drug Screening Test(s):  Lab Results  Component Value Date   AMPHSCRSER Negative 11/30/2017   BARBSCRSER Negative 11/30/2017   BENZOSCRSER Negative 11/30/2017   COCAINSCRSER Negative 11/30/2017   COCAINSCRNUR NONE DETECTED 03/25/2015   PCPSCRSER Negative 11/30/2017    THCSCRSER Negative 11/30/2017   THCU NONE DETECTED 03/25/2015   OPIATESCRSER Negative 11/30/2017   OXYSCRSER Negative 11/30/2017   PROPOXSCRSER Negative 11/30/2017  ETH <5 02/19/2017   ETH <5 03/25/2015   List of all UDS test(s) done:  No results found for: TOXASSSELUR, SUMMARY Last UDS on record: No results found for: TOXASSSELUR, SUMMARY UDS interpretation: Not applicable.          Medication Assessment Form: Not applicable. No opioids. Treatment compliance: Treatment may start today if patient agrees with proposed plan. Evaluation of compliance is not applicable at this point Risk Assessment Profile: Aberrant behavior: See initial evaluations. None observed or detected today Comorbid factors increasing risk of overdose: See initial evaluation. No additional risks detected today Medical Psychology Evaluation: Please see scanned results in medical record. Opioid Risk Tool - 11/30/17 1025      Family History of Substance Abuse   Alcohol  Negative    Illegal Drugs  Negative    Rx Drugs  Negative      Personal History of Substance Abuse   Alcohol  Negative    Illegal Drugs  Negative    Rx Drugs  Negative      Age   Age between 87-45 years   No      History of Preadolescent Sexual Abuse   History of Preadolescent Sexual Abuse  Negative or Female      Psychological Disease   Psychological Disease  Negative    Depression  Negative      Total Score   Opioid Risk Tool Scoring  0    Opioid Risk Interpretation  Low Risk      ORT Scoring interpretation table:  Score <3 = Low Risk for SUD  Score between 4-7 = Moderate Risk for SUD  Score >8 = High Risk for Opioid Abuse   Risk Mitigation Strategies:  Patient opioid safety counseling: No controlled substances prescribed. Patient-Prescriber Agreement (PPA): No agreement signed.  Controlled substance notification to other providers: Not applicable  Pharmacologic Plan: No opioid analgesic prescribed.             Laboratory  Chemistry  Inflammation Markers (CRP: Acute Phase) (ESR: Chronic Phase) Lab Results  Component Value Date   CRP <0.8 11/30/2017   ESRSEDRATE 19 11/30/2017   LATICACIDVEN 0.8 03/26/2015                         Renal Function Markers Lab Results  Component Value Date   BUN 19 11/30/2017   CREATININE 1.01 (H) 11/30/2017   GFRAA >60 11/30/2017   GFRNONAA >60 11/30/2017                              Hepatic Function Markers Lab Results  Component Value Date   AST 23 11/30/2017   ALT 18 11/30/2017   ALBUMIN 4.0 11/30/2017   ALKPHOS 68 11/30/2017   LIPASE 50 02/19/2017                        Electrolytes Lab Results  Component Value Date   NA 138 11/30/2017   K 3.6 11/30/2017   CL 105 11/30/2017   CALCIUM 9.1 11/30/2017   MG 2.2 11/30/2017   PHOS 3.8 02/19/2017                        Neuropathy Markers Lab Results  Component Value Date   VITAMINB12 239 11/30/2017   HGBA1C 5.6 02/21/2017  Bone Pathology Markers Lab Results  Component Value Date   VD25OH 19.3 (L) 11/30/2017                         Coagulation Parameters Lab Results  Component Value Date   INR 0.97 11/26/2015   LABPROT 13.1 11/26/2015   APTT 26 11/26/2015   PLT 274 02/19/2017   DDIMER 2.77 (H) 04/02/2015                        Cardiovascular Markers Lab Results  Component Value Date   BNP 20.8 09/21/2015   CKTOTAL 124 01/05/2013   CKMB < 0.5 (L) 01/05/2013   TROPONINI <0.03 02/19/2017   HGB 14.6 02/19/2017   HCT 43.8 02/19/2017                         Note: Lab results reviewed.  Recent Diagnostic Imaging Review  Cervical Imaging: Cervical MR wo contrast:  Results for orders placed during the hospital encounter of 11/21/07  MR Cervical Spine Wo Contrast   Narrative Clinical Data:  Neck pain.  Low back pain.  Bilateral shoulder and arm pain.  Left arm weakness.  Bilateral leg weakness.   MRI CERVICAL SPINE WITHOUT CONTRAST   Technique: Multiplanar and  multiecho pulse sequences of the cervical spine, to include the craniocervical junction and cervicothoracic junction, were obtained according to standard protocol without  intravenous contrast.   Comparison: None   Findings:  ]   Alignment of the spine is normal.  There is no abnormality at the foramen magnum or C1-2.  The intervertebral discs throughout the cervical region are normal.  The spinal canal is widely patent. Ample subarachnoid space surrounds the cord.  No abnormal cord signal is seen.  No osseous or articular pathology.   IMPRESSION: Normal MRI of the cervical spine   MRI LUMBAR SPINE WITHOUT CONTRAST   Technique: Multiplanar and multiecho pulse sequences of the lumbar spine were obtained  without intravenous contrast.   Comparison: None   Findings:  There is no abnormality at L4-5 or above.  The discs are unremarkable.  The canal and foramina are widely patent.  The distal cord and conus are normal.   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  The disc is degenerated and shows a shallow protrusion.  The central canal is sufficiently patent, but there is bilateral neural foraminal stenosis that could effect either or both L5 nerve roots.   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  Provider: Mancel Bale   Cervical CT wo contrast:  Results for orders placed during the hospital encounter of 03/25/15  CT Cervical Spine Wo Contrast   Narrative CLINICAL DATA:  Found on floor at work.  Unresponsive.  EXAM: CT HEAD WITHOUT CONTRAST  CT CERVICAL SPINE WITHOUT CONTRAST  TECHNIQUE: Multidetector CT imaging of the head and cervical spine was performed following the standard protocol without intravenous contrast. Multiplanar CT image reconstructions of the cervical spine were also generated.  COMPARISON:   10/27/2012  FINDINGS: CT HEAD FINDINGS  No acute intracranial abnormality. Specifically, no hemorrhage, hydrocephalus, mass lesion, acute infarction, or significant intracranial injury. No acute calvarial abnormality. Visualized paranasal sinuses and mastoids clear. Orbital soft tissues unremarkable.  CT CERVICAL SPINE FINDINGS  Motion artifact degrades image quality. No fracture visualized. No  malalignment. Prevertebral soft tissues are normal. No epidural or paraspinal hematoma.  IMPRESSION: No intracranial abnormality.  No acute bony abnormality in the cervical spine.   Electronically Signed   By: Rolm Baptise M.D.   On: 03/25/2015 15:06    Lumbosacral Imaging: Lumbar MR wo contrast:  Results for orders placed during the hospital encounter of 11/21/07  MR Lumbar Spine Wo Contrast   Narrative Clinical Data:  Neck pain.  Low back pain.  Bilateral shoulder and arm pain.  Left arm weakness.  Bilateral leg weakness.   MRI CERVICAL SPINE WITHOUT CONTRAST   Technique: Multiplanar and multiecho pulse sequences of the cervical spine, to include the craniocervical junction and cervicothoracic junction, were obtained according to standard protocol without  intravenous contrast.   Comparison: None   Findings:  ]   Alignment of the spine is normal.  There is no abnormality at the foramen magnum or C1-2.  The intervertebral discs throughout the cervical region are normal.  The spinal canal is widely patent. Ample subarachnoid space surrounds the cord.  No abnormal cord signal is seen.  No osseous or articular pathology.   IMPRESSION: Normal MRI of the cervical spine   MRI LUMBAR SPINE WITHOUT CONTRAST   Technique: Multiplanar and multiecho pulse sequences of the lumbar spine were obtained  without intravenous contrast.   Comparison: None   Findings:  There is no abnormality at L4-5 or above.  The discs are unremarkable.  The canal and foramina are widely patent.   The distal cord and conus are normal.   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  The disc is degenerated and shows a shallow protrusion.  The central canal is sufficiently patent, but there is bilateral neural foraminal stenosis that could effect either or both L5 nerve roots.   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  Provider: Mancel Bale   Lumbar DG Bending views:  Results for orders placed during the hospital encounter of 11/30/17  DG Lumbar Spine Complete W/Bend   Narrative CLINICAL DATA:  Low back pain.  EXAM: LUMBAR SPINE - COMPLETE WITH BENDING VIEWS  COMPARISON:  CT abdomen and pelvis 02/19/2017.  FINDINGS: There is BILATERAL L5 spondylolysis with significant spondylolisthesis L5 on S1. Near complete loss of interspace height at L5-S1. 13 mm of anterolisthesis with patient weight-bearing in neutral and extension. This increases to 16 mm in flexion.  The other lumbar disc spaces are unremarkable. There is mild facet arthropathy at L4-5. There is ossification of the iliolumbar ligament on the RIGHT at L5. Bone density is grossly normal but should be correlated with results of DEXA. There is no lumbar spine fracture or worrisome osseous lesion.  IMPRESSION: BILATERAL L5 spondylolysis with significant spondylolisthesis L5 on S1, grade 2-3, measuring 13 mm in neutral and extension, increasing to 16 mm in flexion.   Electronically Signed   By: Staci Righter M.D.   On: 11/30/2017 13:33    Knee Imaging: Knee-R DG 4 views:  Results for orders placed during the hospital encounter of 12/27/14  DG Knee Complete 4 Views Right   Narrative CLINICAL DATA:  Right knee pain for 1 year, worse in the last week. Anterior knee pain for 2 days. Previously posterior knee pain and history Baker cyst.  EXAM: RIGHT KNEE - COMPLETE 4+  VIEW  COMPARISON:  None.  FINDINGS: [place] views are suboptimal due to positioning. No  acute fracture, dislocation, or knee joint effusion is identified. There is mild medial and lateral compartment joint space narrowing. No lytic or blastic osseous lesion or soft tissue abnormality is identified.  IMPRESSION: Mild medial and lateral compartment joint space narrowing. No acute osseous abnormality.   Electronically Signed   By: Logan Bores   On: 01/01/2015 12:00    Complexity Note: Imaging results reviewed. Results shared with Ms. Hoheisel, using Layman's terms.                         Meds   Current Outpatient Medications:  .  albuterol (PROVENTIL HFA;VENTOLIN HFA) 108 (90 BASE) MCG/ACT inhaler, Inhale 2 puffs into the lungs every 6 (six) hours as needed for wheezing or shortness of breath., Disp: 1 Inhaler, Rfl: 0 .  amitriptyline (ELAVIL) 25 MG tablet, Take 25 mg by mouth at bedtime., Disp: , Rfl:  .  atorvastatin (LIPITOR) 20 MG tablet, Take 1 tablet (20 mg total) by mouth daily at 6 PM. (Patient taking differently: Take 40 mg by mouth daily at 6 PM. ), Disp: 90 tablet, Rfl: 1 .  citalopram (CELEXA) 40 MG tablet, Take 40 mg by mouth daily., Disp: , Rfl:  .  furosemide (LASIX) 20 MG tablet, Take 20 mg by mouth daily and may take an additional daily as directed (Patient taking differently: 40 mg. Take 20 mg by mouth daily and may take an additional daily as directed), Disp: 90 tablet, Rfl: 3 .  gabapentin (NEURONTIN) 300 MG capsule, Take 300 mg by mouth daily., Disp: , Rfl:  .  lisinopril (PRINIVIL,ZESTRIL) 5 MG tablet, Take 5 mg by mouth daily., Disp: , Rfl:  .  Magnesium 400 MG TABS, Take 400 mg by mouth daily., Disp: , Rfl:  .  metoprolol tartrate (LOPRESSOR) 25 MG tablet, Take 0.5 tablets (12.5 mg total) by mouth 2 (two) times daily. (Patient taking differently: Take 25 mg by mouth 2 (two) times daily. ), Disp: 60 tablet, Rfl: 1 .  rivaroxaban (XARELTO) 20 MG TABS tablet, Take  20 mg by mouth daily with supper., Disp: , Rfl:  .  topiramate (TOPAMAX) 100 MG tablet, Take 1 tablet (100 mg total) by mouth 2 (two) times daily., Disp: 60 tablet, Rfl: 1 .  Calcium Carbonate-Vit D-Min (GNP CALCIUM 1200) 1200-1000 MG-UNIT CHEW, Chew 1,200 mg by mouth daily with breakfast. Take in combination with vitamin D and magnesium., Disp: 30 tablet, Rfl: 5 .  Cholecalciferol (VITAMIN D3) 5000 units CAPS, Take 1 capsule (5,000 Units total) by mouth daily with breakfast. Take along with calcium and magnesium., Disp: 30 capsule, Rfl: 5 .  diphenhydrAMINE (BENADRYL) 50 MG capsule, Take 1 capsule (50 mg total) by mouth PRO for 1 day. Take 50 mg by mouth 1 hour before procedure. Must have a driver., Disp: 10 capsule, Rfl: 0 .  ergocalciferol (VITAMIN D2) 50000 units capsule, Take 1 capsule (50,000 Units total) by mouth 2 (two) times a week. X 6 weeks., Disp: 12 capsule, Rfl: 0 .  Magnesium 500 MG CAPS, Take 1 capsule (500 mg total) by mouth 2 (two) times daily at 8 am and 10 pm., Disp: 60 capsule, Rfl: 5 .  predniSONE (DELTASONE) 50 MG tablet, Take 1 tablet (50 mg total) by mouth PRO for 1 day. Take 1 tab PO 13 hrs, 7 hrs, and 1 hr before contrast media injection (procedure)., Disp: 10 tablet, Rfl: 0  ROS  Constitutional: Denies any fever or chills Gastrointestinal: No reported  hemesis, hematochezia, vomiting, or acute GI distress Musculoskeletal: Denies any acute onset joint swelling, redness, loss of ROM, or weakness Neurological: No reported episodes of acute onset apraxia, aphasia, dysarthria, agnosia, amnesia, paralysis, loss of coordination, or loss of consciousness  Allergies  Ms. Deese is allergic to iodinated diagnostic agents; toradol [ketorolac tromethamine]; tramadol; and zofran [ondansetron hcl].  [place]  Drug: Ms. Galeana  reports that she does not use drugs. Alcohol:  reports that she does not drink alcohol. Tobacco:  reports that she has never smoked. She has never used smokeless  tobacco. 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 Surgical: Ms. Fines  has a past surgical history that includes Tubal ligation (1998); Ovarian cyst removal (Left); central line (03/25/2015); Hand surgery (Left); Cardiac catheterization (N/A, 03/27/2015); Cardiac catheterization (N/A, 04/11/2015); Cardiac catheterization (N/A, 07/16/2015); Colonoscopy with propofol (N/A, 10/01/2015); and Esophagogastroduodenoscopy (egd) with propofol (N/A, 10/01/2015). Family: family history includes Allergies in her daughter, son, and son; Asthma in her father and mother; Breast cancer in her maternal grandmother and paternal aunt; Cancer in her maternal grandmother; Depression in her mother; Diabetes in her father; Early death in her maternal grandmother and paternal grandmother; Heart disease in her son; Hyperlipidemia in her father and mother; Hypertension in her father and mother; Kidney disease in her father.  Constitutional Exam  General appearance: Well nourished, well developed, and well hydrated. In no apparent acute distress Vitals:   12/16/17 0830 12/16/17 0831  BP:  (!) 125/94  Pulse:  66  Resp:  16  Temp:  98.7 F (37.1 C)  SpO2:  97%  Weight: 209 lb (94.8 kg)   Height: 5' 2" (1.575 m)    BMI Assessment: Estimated body mass index is 38.23 kg/m as calculated from the following:   Height as of this encounter: 5' 2" (1.575 m).   Weight as of this encounter: 209 lb (94.8 kg).  BMI interpretation table: BMI level Category Range association with higher incidence of chronic pain  <18 kg/m2 Underweight    18.5-24.9 kg/m2 Ideal body weight   25-29.9 kg/m2 Overweight Increased incidence by 20%  30-34.9 kg/m2 Obese (Class I) Increased incidence by 68%  35-39.9 kg/m2 Severe obesity (Class II) Increased incidence by 136%  >40 kg/m2 Extreme obesity (Class III) Increased incidence by 254%   BMI Readings from Last 4 Encounters:  12/16/17 38.23 kg/m  11/30/17 37.49 kg/m  02/20/17 41.70 kg/m  11/20/15 39.14 kg/m   Wt Readings from Last 4 Encounters:  12/16/17 209 lb (94.8 kg)  11/30/17 205 lb (93 kg)  02/20/17 220 lb 11.2 oz (100.1 kg)  11/20/15 214 lb (97.1 kg)  Psych/Mental status: Alert, oriented x 3 (person, place, & time)       Eyes: PERLA Respiratory: No evidence of acute respiratory distress  Cervical Spine Area Exam  Skin & Axial Inspection: No masses, redness, edema, swelling, or associated skin lesions Alignment: Symmetrical Functional ROM: Unrestricted ROM      Stability: No instability detected Muscle Tone/Strength: Functionally intact. No obvious neuro-muscular anomalies detected. Sensory (Neurological): Unimpaired Palpation: No palpable anomalies              Upper Extremity (UE) Exam    Side: Right upper extremity  Side: Left upper extremity  Skin & Extremity Inspection: Skin color, temperature, and hair growth are WNL. No peripheral edema or cyanosis. No masses, redness, swelling, asymmetry, or associated skin lesions. No contractures.  Skin & Extremity Inspection: Skin color, temperature, and hair growth are WNL. No peripheral edema or cyanosis. No masses, redness, swelling, asymmetry, or associated skin lesions. No contractures.  Functional ROM: Unrestricted ROM          Functional ROM: Unrestricted ROM          Muscle Tone/Strength: Functionally intact. No obvious neuro-muscular anomalies detected.  Muscle Tone/Strength: Functionally intact. No obvious neuro-muscular anomalies detected.  Sensory (Neurological): Unimpaired          Sensory (Neurological): Unimpaired           Palpation: No palpable anomalies              Palpation: No palpable anomalies              Specialized Test(s): Deferred         Specialized Test(s): Deferred          Thoracic Spine Area Exam  Skin & Axial Inspection: No masses, redness, or swelling Alignment: Symmetrical Functional ROM: Unrestricted ROM Stability: No instability detected Muscle Tone/Strength: Functionally intact. No obvious neuro-muscular anomalies detected. Sensory (Neurological): Unimpaired Muscle strength & Tone: No palpable anomalies  Lumbar Spine Area Exam  Skin & Axial Inspection: No masses, redness, or swelling Alignment: Symmetrical Functional ROM: Decreased ROM       Stability: No instability detected Muscle Tone/Strength: Functionally intact. No obvious neuro-muscular anomalies detected. Sensory (Neurological): Movement-associated pain Palpation: Complains of area being tender to palpation       Provocative Tests: Lumbar Hyperextension and rotation test: Positive bilaterally for facet joint pain. Lumbar Lateral bending test: evaluation deferred today       Patrick's Maneuver: evaluation deferred today                    Gait & Posture Assessment  Ambulation: Patient ambulates using a cane Gait: Very limited, using assistive device to ambulate Posture: Antalgic   Lower Extremity Exam    Side: Right lower extremity  Side: Left lower extremity  Skin & Extremity Inspection: Skin color, temperature, and hair growth are WNL. No peripheral edema or cyanosis. No masses, redness, swelling, asymmetry, or associated skin lesions. No contractures.  Skin & Extremity Inspection: Skin color, temperature, and hair growth are WNL. No peripheral edema or cyanosis. No masses, redness, swelling, asymmetry, or associated skin lesions. No contractures.  Functional ROM: Guarding          Functional ROM: Unrestricted ROM          Muscle Tone/Strength: L5 myotomal (EHL) weakness (Toe Dorsiflexion)  Muscle Tone/Strength:  Functionally intact. No obvious neuro-muscular anomalies detected.  Sensory (Neurological): Dermatomal pain pattern (L5)  Sensory (Neurological): Dermatomal pain pattern (L5)  Palpation: No palpable anomalies  Palpation: No palpable anomalies   Assessment & Plan  Primary Diagnosis & Pertinent Problem List: The primary encounter diagnosis was Chronic low back pain (Primary Area of Pain) (Bilateral) (R>L). 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  Visit Diagnosis: 1. Chronic low back pain (Primary Area of Pain) (Bilateral) (R>L)   2. Grade 2-3 (13-16 mm) Anterolisthesis of L5 over S1   3. DDD (degenerative disc disease), lumbar   4. Spinal instability of lumbosacral region (L5-S1)   5. Spinal stenosis of lumbosacral region   6. Chronic lower extremity pain (Secondary Area of Pain) (Bilateral) (R>L)   7. Lumbar foraminal stenosis (L5-S1) (Bilateral)   8. Lumbosacral radiculopathy at S1   9. Chronic lumbar radicular pain (S1) (Bilateral)   10. Spondylolisthesis of lumbosacral region   11. Other intervertebral disc degeneration, lumbar region   12. Neurogenic pain   13. Encounter for other specified surgical aftercare   14. Chronic anticoagulation (Xarelto)   15.  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 History of allergy to radiographic contrast media   17. History of placement of internal cardiac defibrillator   18. Vitamin D deficiency    Problems updated and reviewed during this visit: No problems updated.  Plan of Care  Pharmacotherapy (Medications Ordered): Meds ordered this encounter  Medications  . ergocalciferol (VITAMIN D2) 50000 units capsule    Sig: Take 1 capsule (50,000 Units total) by mouth 2 (two) times a week. X 6 weeks.    Dispense:  12 capsule    Refill:  0    Do not add this medication to the electronic "Automatic Refill" notification system. Patient may have prescription filled one day early if pharmacy is closed on scheduled refill date.  . Cholecalciferol (VITAMIN D3) 5000 units CAPS    Sig: Take 1 capsule (5,000 Units total) by mouth daily with breakfast. Take along with calcium and magnesium.    Dispense:  30 capsule    Refill:  5    Do not place medication on "Automatic Refill".  May substitute with similar over-the-counter product.  . Magnesium 500 MG CAPS    Sig: Take 1 capsule (500 mg total) by mouth 2 (two) times daily at 8 am and 10 pm.    Dispense:  60 capsule    Refill:  5    Do not place medication on "Automatic Refill".  The patient may use similar over-the-counter product.  . Calcium Carbonate-Vit D-Min (GNP CALCIUM 1200) 1200-1000 MG-UNIT CHEW    Sig: Chew 1,200 mg by mouth daily with breakfast. Take in combination with vitamin D and magnesium.    Dispense:  30 tablet    Refill:  5    Do not place medication on "Automatic Refill".  May substitute with similar over-the-counter product.  . predniSONE (DELTASONE) 50 MG tablet    Sig: Take 1 tablet (50 mg total) by mouth PRO for 1 day. Take 1 tab PO 13 hrs, 7 hrs, and 1 hr before contrast media injection (procedure).    Dispense:  10 tablet    Refill:  0    Instruct patient to take as follows: Prednisone - 50 mg by mouth at 13 hours, 7 hours, and 1 hour before  contrast media injection, plus Diphenhydramine (Benadryl) - 50 mg by mouth 1 hour before contrast medium procedure.  . diphenhydrAMINE (BENADRYL) 50 MG capsule    Sig: Take 1 capsule (50 mg total) by mouth PRO for 1 day. Take 50 mg by mouth 1 hour before procedure. Must have a driver.  Dispense:  10 capsule    Refill:  0    Instruct patient to take: Diphenhydramine (Benadryl) - 50 mg by mouth 1 hour before contrast medium injection procedure. Must have a driver. Do not drive or operate machinery or tools for 24 hours after taking medication.   Procedure Orders    No procedure(s) ordered today   Lab Orders  No laboratory test(s) ordered today    Imaging Orders     CT LUMBAR SPINE W WO CONTRAST     DG Myelogram Lumbar  Referral Orders     Ambulatory referral to Neurosurgery  Pharmacological management options:  Opioid Analgesics: We'll take over management today. See above orders Membrane stabilizer: We have discussed the possibility of optimizing this mode of therapy, if tolerated Muscle relaxant: We have discussed the possibility of a trial NSAID: We have discussed the possibility of a trial Other analgesic(s): To be determined at a later time   Interventional management options: Planned, scheduled, and/or pending:    Diagnostic bilateral L5 TFESI #1 under fluoro and IV sedation.   Considering:   Diagnostic bilateral L5 TFESI  Diagnostic bilateral lumbar facet block  Possible bilateral facet RFA    PRN Procedures:   None at this time   Provider-requested follow-up: Return for Procedure (w/ sedation): (B) L5 TFESI #1.  Future Appointments  Date Time Provider [place]  12/31/2017  8:15 AM Milinda Pointer, MD [facility] None  01/08/2018  8:30 AM [id number]  01/08/2018  9:30 AM [facility]    Primary Care Physician: [facility] Location: [facility] Note by: [name], MD Date:  12/16/2017; Time: 9:59 AM

## 2017-12-16 ENCOUNTER — Ambulatory Visit: Payer: Medicaid Other | Attending: Pain Medicine | Admitting: Pain Medicine

## 2017-12-16 ENCOUNTER — Encounter: Payer: Self-pay | Admitting: Pain Medicine

## 2017-12-16 ENCOUNTER — Other Ambulatory Visit: Payer: Self-pay

## 2017-12-16 VITALS — BP 125/94 | HR 66 | Temp 98.7°F | Resp 16 | Ht 62.0 in | Wt 209.0 lb

## 2017-12-16 DIAGNOSIS — G894 Chronic pain syndrome: Secondary | ICD-10-CM | POA: Diagnosis not present

## 2017-12-16 DIAGNOSIS — M79604 Pain in right leg: Secondary | ICD-10-CM | POA: Diagnosis not present

## 2017-12-16 DIAGNOSIS — M5417 Radiculopathy, lumbosacral region: Secondary | ICD-10-CM

## 2017-12-16 DIAGNOSIS — Z9889 Other specified postprocedural states: Secondary | ICD-10-CM | POA: Insufficient documentation

## 2017-12-16 DIAGNOSIS — Z79899 Other long term (current) drug therapy: Secondary | ICD-10-CM | POA: Insufficient documentation

## 2017-12-16 DIAGNOSIS — M792 Neuralgia and neuritis, unspecified: Secondary | ICD-10-CM

## 2017-12-16 DIAGNOSIS — E669 Obesity, unspecified: Secondary | ICD-10-CM | POA: Insufficient documentation

## 2017-12-16 DIAGNOSIS — Z9851 Tubal ligation status: Secondary | ICD-10-CM | POA: Insufficient documentation

## 2017-12-16 DIAGNOSIS — M545 Low back pain: Secondary | ICD-10-CM | POA: Diagnosis present

## 2017-12-16 DIAGNOSIS — M79605 Pain in left leg: Secondary | ICD-10-CM

## 2017-12-16 DIAGNOSIS — M532X7 Spinal instabilities, lumbosacral region: Secondary | ICD-10-CM | POA: Diagnosis not present

## 2017-12-16 DIAGNOSIS — Z7901 Long term (current) use of anticoagulants: Secondary | ICD-10-CM | POA: Diagnosis not present

## 2017-12-16 DIAGNOSIS — Z6838 Body mass index (BMI) 38.0-38.9, adult: Secondary | ICD-10-CM | POA: Insufficient documentation

## 2017-12-16 DIAGNOSIS — Z4889 Encounter for other specified surgical aftercare: Secondary | ICD-10-CM | POA: Diagnosis not present

## 2017-12-16 DIAGNOSIS — I2510 Atherosclerotic heart disease of native coronary artery without angina pectoris: Secondary | ICD-10-CM | POA: Diagnosis not present

## 2017-12-16 DIAGNOSIS — M5416 Radiculopathy, lumbar region: Secondary | ICD-10-CM | POA: Diagnosis not present

## 2017-12-16 DIAGNOSIS — M48061 Spinal stenosis, lumbar region without neurogenic claudication: Secondary | ICD-10-CM | POA: Insufficient documentation

## 2017-12-16 DIAGNOSIS — M5136 Other intervertebral disc degeneration, lumbar region: Secondary | ICD-10-CM | POA: Diagnosis not present

## 2017-12-16 DIAGNOSIS — Z888 Allergy status to other drugs, medicaments and biological substances status: Secondary | ICD-10-CM | POA: Insufficient documentation

## 2017-12-16 DIAGNOSIS — Z95810 Presence of automatic (implantable) cardiac defibrillator: Secondary | ICD-10-CM | POA: Insufficient documentation

## 2017-12-16 DIAGNOSIS — M9983 Other biomechanical lesions of lumbar region: Secondary | ICD-10-CM

## 2017-12-16 DIAGNOSIS — M4310 Spondylolisthesis, site unspecified: Secondary | ICD-10-CM

## 2017-12-16 DIAGNOSIS — M4317 Spondylolisthesis, lumbosacral region: Secondary | ICD-10-CM | POA: Diagnosis not present

## 2017-12-16 DIAGNOSIS — G8929 Other chronic pain: Secondary | ICD-10-CM

## 2017-12-16 DIAGNOSIS — M5442 Lumbago with sciatica, left side: Secondary | ICD-10-CM

## 2017-12-16 DIAGNOSIS — Z91041 Radiographic dye allergy status: Secondary | ICD-10-CM

## 2017-12-16 DIAGNOSIS — M4807 Spinal stenosis, lumbosacral region: Secondary | ICD-10-CM | POA: Diagnosis not present

## 2017-12-16 DIAGNOSIS — M5441 Lumbago with sciatica, right side: Secondary | ICD-10-CM

## 2017-12-16 DIAGNOSIS — E559 Vitamin D deficiency, unspecified: Secondary | ICD-10-CM

## 2017-12-16 MED ORDER — VITAMIN D3 125 MCG (5000 UT) PO CAPS: 1.0000 | ORAL_CAPSULE | Freq: Every day | ORAL | 5 refills | Status: AC

## 2017-12-16 MED ORDER — PREDNISONE 50 MG PO TABS: 50.0000 mg | ORAL_TABLET | ORAL | 0 refills | Status: DC

## 2017-12-16 MED ORDER — GNP CALCIUM 1200 1200-1000 MG-UNIT PO CHEW: 1200.0000 mg | CHEWABLE_TABLET | Freq: Every day | ORAL | 5 refills | Status: DC

## 2017-12-16 MED ORDER — DIPHENHYDRAMINE HCL 50 MG PO CAPS: 50.0000 mg | ORAL_CAPSULE | ORAL | 0 refills | Status: DC

## 2017-12-16 MED ORDER — MAGNESIUM 500 MG PO CAPS: 500.0000 mg | ORAL_CAPSULE | Freq: Two times a day (BID) | ORAL | 5 refills | Status: AC

## 2017-12-16 MED ORDER — ERGOCALCIFEROL 1.25 MG (50000 UT) PO CAPS: 50000.0000 [IU] | ORAL_CAPSULE | ORAL | 0 refills | Status: AC

## 2017-12-16 NOTE — Progress Notes (Signed)
Safety precautions to be maintained throughout the outpatient stay will include: orient to surroundings, keep bed in low position, maintain call bell within reach at all times, provide assistance with transfer out of bed and ambulation.  

## 2017-12-16 NOTE — Patient Instructions (Addendum)
________You have been given a script for Benadryl and Prednisone today.  You have other medications that have been e scribed to your pharmacy.  You have xrays and CT scan and EMG to be done.  I will send an approval letter to your PCP to get approval to stop blood thinner in case of procedure.  ____________________________________________________________________________________  Pain Scale  Introduction: The pain score used by this practice is the Verbal Numerical Rating Scale (VNRS-11). This is an 11-point scale. It is for adults and children 10 years or older. There are significant differences in how the pain score is reported, used, and applied. Forget everything you learned in the past and learn this scoring system.  General Information: The scale should reflect your current level of pain. Unless you are specifically asked for the level of your worst pain, or your average pain. If you are asked for one of these two, then it should be understood that it is over the past 24 hours.  Basic Activities of Daily Living (ADL): Personal hygiene, dressing, eating, transferring, and using restroom.  Instructions: Most patients tend to report their level of pain as a combination of two factors, their physical pain and their psychosocial pain. This last one is also known as "suffering" and it is reflection of how physical pain affects you socially and psychologically. From now on, report them separately. From this point on, when asked to report your pain level, report only your physical pain. Use the following table for reference.  Pain Clinic Pain Levels (0-5/10)  Pain Level Score  Description  No Pain 0   Mild pain 1 Nagging, annoying, but does not interfere with basic activities of daily living (ADL). Patients are able to eat, bathe, get dressed, toileting (being able to get on and off the toilet and perform personal hygiene functions), transfer (move in and out of bed or a chair without assistance), and  maintain continence (able to control bladder and bowel functions). Blood pressure and heart rate are unaffected. A normal heart rate for a healthy adult ranges from 60 to 100 bpm (beats per minute).   Mild to moderate pain 2 Noticeable and distracting. Impossible to hide from other people. More frequent flare-ups. Still possible to adapt and function close to normal. It can be very annoying and may have occasional stronger flare-ups. With discipline, patients may get used to it and adapt.   Moderate pain 3 Interferes significantly with activities of daily living (ADL). It becomes difficult to feed, bathe, get dressed, get on and off the toilet or to perform personal hygiene functions. Difficult to get in and out of bed or a chair without assistance. Very distracting. With effort, it can be ignored when deeply involved in activities.   Moderately severe pain 4 Impossible to ignore for more than a few minutes. With effort, patients may still be able to manage work or participate in some social activities. Very difficult to concentrate. Signs of autonomic nervous system discharge are evident: dilated pupils (mydriasis); mild sweating (diaphoresis); sleep interference. Heart rate becomes elevated (>115 bpm). Diastolic blood pressure (lower number) rises above 100 mmHg. Patients find relief in laying down and not moving.   Severe pain 5 Intense and extremely unpleasant. Associated with frowning face and frequent crying. Pain overwhelms the senses.  Ability to do any activity or maintain social relationships becomes significantly limited. Conversation becomes difficult. Pacing back and forth is common, as getting into a comfortable position is nearly impossible. Pain wakes you up from  deep sleep. Physical signs will be obvious: pupillary dilation; increased sweating; goosebumps; brisk reflexes; cold, clammy hands and feet; nausea, vomiting or dry heaves; loss of appetite; significant sleep disturbance with  inability to fall asleep or to remain asleep. When persistent, significant weight loss is observed due to the complete loss of appetite and sleep deprivation.  Blood pressure and heart rate becomes significantly elevated. Caution: If elevated blood pressure triggers a pounding headache associated with blurred vision, then the patient should immediately seek attention at an urgent or emergency care unit, as these may be signs of an impending stroke.    Emergency Department Pain Levels (6-10/10)  Emergency Room Pain 6 Severely limiting. Requires emergency care and should not be seen or managed at an outpatient pain management facility. Communication becomes difficult and requires great effort. Assistance to reach the emergency department may be required. Facial flushing and profuse sweating along with potentially dangerous increases in heart rate and blood pressure will be evident.   Distressing pain 7 Self-care is very difficult. Assistance is required to transport, or use restroom. Assistance to reach the emergency department will be required. Tasks requiring coordination, such as bathing and getting dressed become very difficult.   Disabling pain 8 Self-care is no longer possible. At this level, pain is disabling. The individual is unable to do even the most "basic" activities such as walking, eating, bathing, dressing, transferring to a bed, or toileting. Fine motor skills are lost. It is difficult to think clearly.   Incapacitating pain 9 Pain becomes incapacitating. Thought processing is no longer possible. Difficult to remember your own name. Control of movement and coordination are lost.   The worst pain imaginable 10 At this level, most patients pass out from pain. When this level is reached, collapse of the autonomic nervous system occurs, leading to a sudden drop in blood pressure and heart rate. This in turn results in a temporary and dramatic drop in blood flow to the brain, leading to a loss  of consciousness. Fainting is one of the body's self defense mechanisms. Passing out puts the brain in a calmed state and causes it to shut down for a while, in order to begin the healing process.    Summary: 1. Refer to this scale when providing [place] with your pain level. 2. Be accurate and careful when reporting your pain level. This will help with your care. 3. Over-reporting your pain level will lead to loss of credibility. 4. Even a level of 1/10 means that there is pain and will be treated at our facility. 5. High, inaccurate reporting will be documented as "Symptom Exaggeration", leading to loss of credibility and suspicions of possible secondary gains such as obtaining more narcotics, or wanting to appear disabled, for fraudulent reasons. 6. Only pain levels of 5 or below will be seen at our facility. 7. Pain levels of 6 and above will be sent to the Emergency Department and the appointment cancelled. ____________________________________________________________________________________________   ____________________________________________________________________________________________  Preparing for Procedure with Sedation  Instructions: . Oral Intake: Do not eat or drink anything for at least 8 hours prior to your procedure. . Transportation: Public transportation is not allowed. Bring an adult driver. The driver must be physically present in our waiting room before any procedure can be started. Marland Kitchen Physical Assistance: Bring an adult physically capable of assisting you, in the event you need help. This adult should keep you company at home for at least 6 hours after the procedure. . Blood Pressure Medicine:  Take your blood pressure medicine with a sip of water the morning of the procedure. . Blood thinners:  . Diabetics on insulin: Notify the staff so that you can be scheduled 1st case in the morning. If your diabetes requires high dose insulin, take only  of your normal insulin dose  the morning of the procedure and notify the staff that you have done so. . Preventing infections: Shower with an antibacterial soap the morning of your procedure. . Build-up your immune system: Take 1000 mg of Vitamin C with every meal (3 times a day) the day prior to your procedure. Marland Kitchen Antibiotics: Inform the staff if you have a condition or reason that requires you to take antibiotics before dental procedures. . Pregnancy: If you are pregnant, call and cancel the procedure. . Sickness: If you have a cold, fever, or any active infections, call and cancel the procedure. . Arrival: You must be in the facility at least 30 minutes prior to your scheduled procedure. . Children: Do not bring children with you. . Dress appropriately: Bring dark clothing that you would not mind if they get stained. . Valuables: Do not bring any jewelry or valuables.  Procedure appointments are reserved for interventional treatments only. Marland Kitchen No Prescription Refills. . No medication changes will be discussed during procedure appointments. . No disability issues will be discussed.  Remember:  Regular Business hours are:  Monday to Thursday 8:00 AM to 4:00 PM  Provider's Schedule: Milinda Pointer, MD:  Procedure days: Tuesday and Thursday 7:30 AM to 4:00 PM  Gillis Santa, MD:  Procedure days: Monday and Wednesday 7:30 AM to 4:00 PM ____________________________________________________________________________________________

## 2017-12-18 ENCOUNTER — Telehealth: Payer: Self-pay

## 2017-12-18 NOTE — Telephone Encounter (Signed)
Attempted to call patient on all 3 numbers listed.  No answer for any lines.  Need to inform patient that she has approval to stop her Eliquis for 3 days prior to procedure.

## 2017-12-21 ENCOUNTER — Other Ambulatory Visit: Payer: Self-pay | Admitting: Nurse Practitioner

## 2017-12-21 DIAGNOSIS — M545 Low back pain: Principal | ICD-10-CM

## 2017-12-21 DIAGNOSIS — G8929 Other chronic pain: Secondary | ICD-10-CM

## 2017-12-21 DIAGNOSIS — M5417 Radiculopathy, lumbosacral region: Secondary | ICD-10-CM

## 2017-12-21 NOTE — Telephone Encounter (Signed)
Patient notified that she could stop her Eliquis for 3 days.

## 2017-12-31 ENCOUNTER — Encounter: Payer: Self-pay | Admitting: Pain Medicine

## 2017-12-31 ENCOUNTER — Other Ambulatory Visit: Payer: Self-pay

## 2017-12-31 ENCOUNTER — Ambulatory Visit: Payer: Medicaid Other | Admitting: Pain Medicine

## 2017-12-31 ENCOUNTER — Ambulatory Visit
Admission: RE | Admit: 2017-12-31 | Discharge: 2017-12-31 | Disposition: A | Discharge status: Home / Self Care | Payer: Medicaid Other | Source: Ambulatory Visit | Attending: Pain Medicine | Admitting: Pain Medicine

## 2017-12-31 VITALS — BP 121/93 | HR 57 | Temp 98.3°F | Resp 17 | Ht 62.0 in | Wt 213.0 lb

## 2017-12-31 DIAGNOSIS — Z7901 Long term (current) use of anticoagulants: Secondary | ICD-10-CM

## 2017-12-31 DIAGNOSIS — M5136 Other intervertebral disc degeneration, lumbar region: Secondary | ICD-10-CM | POA: Diagnosis not present

## 2017-12-31 DIAGNOSIS — Z91041 Radiographic dye allergy status: Secondary | ICD-10-CM

## 2017-12-31 DIAGNOSIS — M79604 Pain in right leg: Secondary | ICD-10-CM | POA: Diagnosis not present

## 2017-12-31 DIAGNOSIS — M79605 Pain in left leg: Secondary | ICD-10-CM | POA: Insufficient documentation

## 2017-12-31 DIAGNOSIS — M48061 Spinal stenosis, lumbar region without neurogenic claudication: Secondary | ICD-10-CM | POA: Insufficient documentation

## 2017-12-31 DIAGNOSIS — G8929 Other chronic pain: Secondary | ICD-10-CM | POA: Insufficient documentation

## 2017-12-31 DIAGNOSIS — Z888 Allergy status to other drugs, medicaments and biological substances status: Secondary | ICD-10-CM | POA: Diagnosis not present

## 2017-12-31 DIAGNOSIS — M4310 Spondylolisthesis, site unspecified: Secondary | ICD-10-CM

## 2017-12-31 DIAGNOSIS — M5416 Radiculopathy, lumbar region: Secondary | ICD-10-CM | POA: Diagnosis not present

## 2017-12-31 DIAGNOSIS — M5116 Intervertebral disc disorders with radiculopathy, lumbar region: Secondary | ICD-10-CM | POA: Diagnosis not present

## 2017-12-31 DIAGNOSIS — M9983 Other biomechanical lesions of lumbar region: Secondary | ICD-10-CM | POA: Insufficient documentation

## 2017-12-31 DIAGNOSIS — M5417 Radiculopathy, lumbosacral region: Secondary | ICD-10-CM | POA: Insufficient documentation

## 2017-12-31 DIAGNOSIS — M532X7 Spinal instabilities, lumbosacral region: Secondary | ICD-10-CM

## 2017-12-31 MED ORDER — DEXAMETHASONE SODIUM PHOSPHATE 10 MG/ML IJ SOLN
10.0000 mg | Freq: Once | INTRAMUSCULAR | Status: AC
  Administered 2017-12-31: 10 mg
  Filled 2017-12-31: qty 1

## 2017-12-31 MED ORDER — MIDAZOLAM HCL 5 MG/5ML IJ SOLN
1.0000 mg | INTRAMUSCULAR | Status: DC | PRN
  Administered 2017-12-31: 1 mg via INTRAVENOUS
  Filled 2017-12-31: qty 5

## 2017-12-31 MED ORDER — LIDOCAINE HCL 2 % IJ SOLN
20.0000 mL | Freq: Once | INTRAMUSCULAR | Status: AC
  Administered 2017-12-31: 400 mg
  Filled 2017-12-31: qty 40

## 2017-12-31 MED ORDER — FENTANYL CITRATE (PF) 100 MCG/2ML IJ SOLN
25.0000 ug | INTRAMUSCULAR | Status: DC | PRN
  Administered 2017-12-31: 50 ug via INTRAVENOUS
  Filled 2017-12-31: qty 2

## 2017-12-31 MED ORDER — ROPIVACAINE HCL 2 MG/ML IJ SOLN
1.0000 mL | Freq: Once | INTRAMUSCULAR | Status: AC
  Administered 2017-12-31: 1 mL via EPIDURAL
  Filled 2017-12-31: qty 10

## 2017-12-31 MED ORDER — SODIUM CHLORIDE 0.9% FLUSH
1.0000 mL | Freq: Once | INTRAVENOUS | Status: AC
  Administered 2017-12-31: 1 mL

## 2017-12-31 MED ORDER — LACTATED RINGERS IV SOLN
1000.0000 mL | Freq: Once | INTRAVENOUS | Status: AC
  Administered 2017-12-31: 1000 mL via INTRAVENOUS

## 2017-12-31 NOTE — Patient Instructions (Signed)

## 2017-12-31 NOTE — Progress Notes (Signed)
Patient's Name: Claire Mcdonald  MRN: [id number]  Referring Provider: [facility]  DOB: 04/11/1966  PCP: [facility]  DOS: 12/31/2017  Note by: [name], MD  Service setting: Ambulatory outpatient  Specialty: Interventional Pain Management  Patient type: Established  Location: [facility] (AMB) Pain Management Facility  Visit type: Interventional Procedure   Primary Reason for Visit: Interventional Pain Management Treatment. CC: Back Pain (lower, worse on left side)  Procedure:  Anesthesia, Analgesia, Anxiolysis:  Type: Trans-Foraminal Epidural Steroid Injection Purpose: Diagnostic/Therapeutic Region: Posterolateral Lumbosacral Level: L5-S1 Level Laterality: Bilateral Paravertebral Target Area: The 6 o'clock position under the pedicle, on the affected side. Approach: Posterior Percutaneous Paravertebral approach. Position: Prone  Type: Moderate (Conscious) Sedation combined with Local Anesthesia Indication(s): Analgesia and Anxiety Route: Intravenous (IV) IV Access: Secured Sedation: Meaningful verbal contact was maintained at all times during the procedure  Local Anesthetic: Lidocaine 1-2%   Indications: 1. DDD (degenerative disc disease), lumbar   2. Lumbar foraminal stenosis (L5-S1) (Bilateral)   3. Lumbosacral radiculopathy at S1   4. Chronic lumbar radicular pain (S1) (Bilateral)   5. Chronic lower extremity pain (Secondary Area of Pain) (Bilateral) (R>L)   6. Chronic anticoagulation (Xarelto)   7. History of allergy to radiographic contrast media   8. 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 (year old), female patient, seen today for interventional treatment. She  has a past surgical history that includes Tubal ligation (1998); Ovarian cyst removal (Left); central line (03/25/2015); Hand surgery (Left); Cardiac catheterization (N/A, 03/27/2015); Cardiac  catheterization (N/A, 04/11/2015); Cardiac catheterization (N/A, 07/16/2015); Colonoscopy with propofol (N/A, 10/01/2015); and Esophagogastroduodenoscopy (egd) with propofol (N/A, 10/01/2015). Claire Mcdonald has a current medication list which includes the following prescription(s): albuterol, amitriptyline, atorvastatin, gnp calcium 1200, vitamin d3, citalopram, ergocalciferol, furosemide, gabapentin, lisinopril, magnesium, magnesium, metoprolol tartrate, rivaroxaban, topiramate, diphenhydramine, and prednisone, and the following Facility-Administered Medications: fentanyl and midazolam. Her primarily concern today is the Back Pain (lower, worse on left side)  Initial Vital Signs:  Pulse/HCG Rate: 63ECG Heart Rate: (!) 58 Temp: 98.3 F (36.8 C) Resp: 16 BP: (!) 148/103 SpO2: 97 %  BMI: Estimated body mass index is 38.96 kg/m as calculated from the following:   Height as of this encounter: 5\' 2"  (1.575 m).   Weight as of this encounter: 213 lb (96.6 kg).  Risk Assessment: Allergies: Reviewed. She is allergic to iodinated diagnostic agents; toradol [ketorolac tromethamine]; tramadol; and zofran [ondansetron hcl].  Allergy Precautions: No iodine containing solutions or radiological contrast used. Coagulopathies: Reviewed. None identified.  Blood-thinner therapy: None at this time Active Infection(s): Reviewed. None identified. [name] is afebrile  Site Confirmation: Claire Mcdonald was asked to confirm the procedure and laterality before marking the site Procedure checklist: Completed Consent: Before the procedure and under the influence of no sedative(s), amnesic(s), or anxiolytics, the patient was informed of the treatment options, risks and possible complications. To fulfill our ethical and legal obligations, as recommended by the American Medical Association's Code of Ethics, I have informed the patient of my clinical impression; the nature and purpose of the treatment or procedure; the risks, benefits, and  possible complications of the intervention; the alternatives, including doing nothing; the risk(s) and benefit(s) of the alternative treatment(s) or procedure(s); and the risk(s) and benefit(s) of doing nothing. The patient was provided information about the general risks and possible complications associated with the procedure. These  may include, but are not limited to: failure to achieve desired goals, infection, bleeding, organ or nerve damage, allergic reactions, paralysis, and death. In addition, the patient was informed of those risks and complications associated to Spine-related procedures, such as failure to decrease pain; infection (i.e.: Meningitis, epidural or intraspinal abscess); bleeding (i.e.: epidural hematoma, subarachnoid hemorrhage, or any other type of intraspinal or peri-dural bleeding); organ or nerve damage (i.e.: Any type of peripheral nerve, nerve root, or spinal cord injury) with subsequent damage to sensory, motor, and/or autonomic systems, resulting in permanent pain, numbness, and/or weakness of one or several areas of the body; allergic reactions; (i.e.: anaphylactic reaction); and/or death. Furthermore, the patient was informed of those risks and complications associated with the medications. These include, but are not limited to: allergic reactions (i.e.: anaphylactic or anaphylactoid reaction(s)); adrenal axis suppression; blood sugar elevation that in diabetics may result in ketoacidosis or comma; water retention that in patients with history of congestive heart failure may result in shortness of breath, pulmonary edema, and decompensation with resultant heart failure; weight gain; swelling or edema; medication-induced neural toxicity; particulate matter embolism and blood vessel occlusion with resultant organ, and/or nervous system infarction; and/or aseptic necrosis of one or more joints. Finally, the patient was informed that Medicine is not an exact science; therefore, there  is also the possibility of unforeseen or unpredictable risks and/or possible complications that may result in a catastrophic outcome. The patient indicated having understood very clearly. We have given the patient no guarantees and we have made no promises. Enough time was given to the patient to ask questions, all of which were answered to the patient's satisfaction. Ms. Urich has indicated that she wanted to continue with the procedure. Attestation: I, the ordering provider, attest that I have discussed with the patient the benefits, risks, side-effects, alternatives, likelihood of achieving goals, and potential problems during recovery for the procedure that I have provided informed consent. Date  Time: 12/31/2017  8:21 AM  Pre-Procedure Preparation:  Monitoring: As per clinic protocol. Respiration, ETCO2, SpO2, BP, heart rate and rhythm monitor placed and checked for adequate function Safety Precautions: Patient was assessed for positional comfort and pressure points before starting the procedure. Time-out: I initiated and conducted the "Time-out" before starting the procedure, as per protocol. The patient was asked to participate by confirming the accuracy of the "Time Out" information. Verification of the correct person, site, and procedure were performed and confirmed by me, the nursing staff, and the patient. "Time-out" conducted as per Joint Commission's Universal Protocol (UP.01.01.01). Time: 0903  Description of Procedure:       Area Prepped: Entire Posterior Lumbosacral Area Prepping solution: ChloraPrep (2% chlorhexidine gluconate and 70% isopropyl alcohol) Safety Precautions: Aspiration looking for blood return was conducted prior to all injections. At no point did we inject any substances, as a needle was being advanced. No attempts were made at seeking any paresthesias. Safe injection practices and needle disposal techniques used. Medications properly checked for expiration dates. SDV  (single dose vial) medications used. Description of the Procedure: Protocol guidelines were followed. The patient was placed in position over the procedure table. The target area was identified and the area prepped in the usual manner. Skin desensitized using vapocoolant spray. Skin & deeper tissues infiltrated with local anesthetic. Appropriate amount of time allowed to pass for local anesthetics to take effect. The procedure needles were then advanced to the target area. Proper needle placement secured. Negative aspiration confirmed. Solution injected in intermittent fashion, asking  for systemic symptoms every 0.5cc of injectate. The needles were then removed and the area cleansed, making sure to leave some of the prepping solution back to take advantage of its long term bactericidal properties. Vitals:   12/31/17 0919 12/31/17 0928 12/31/17 0938 12/31/17 0948  BP: 126/90 111/85 (!) 125/94 (!) 121/93  Pulse: (!) 57     Resp: 16 16 13 17   Temp:      TempSrc:      SpO2: 94% 98% 98% 96%  Weight:      Height:        Start Time: 0903 hrs. End Time: 0918 hrs. Materials:  Needle(s) Type: Regular needle Gauge: 22G Length: 3.5-in Medication(s): Please see orders for medications and dosing details.  Imaging Guidance (Spinal):  Type of Imaging Technique: Fluoroscopy Guidance (Spinal) Indication(s): Assistance in needle guidance and placement for procedures requiring needle placement in or near specific anatomical locations not easily accessible without such assistance. Exposure Time: Please see nurses notes. Contrast: Before injecting any contrast, we confirmed that the patient did not have an allergy to iodine, shellfish, or radiological contrast. Once satisfactory needle placement was completed at the desired level, radiological contrast was injected. Contrast injected under live fluoroscopy. No contrast complications. See chart for type and volume of contrast used. Fluoroscopic Guidance: I was  personally present during the use of fluoroscopy. "Tunnel Vision Technique" used to obtain the best possible view of the target area. Parallax error corrected before commencing the procedure. "Direction-depth-direction" technique used to introduce the needle under continuous pulsed fluoroscopy. Once target was reached, antero-posterior, oblique, and lateral fluoroscopic projection used confirm needle placement in all planes. Images permanently stored in EMR. Interpretation: I personally interpreted the imaging intraoperatively. Adequate needle placement confirmed in multiple planes. Appropriate spread of contrast into desired area was observed. No evidence of afferent or efferent intravascular uptake. No intrathecal or subarachnoid spread observed. Permanent images saved into the patient's record.  Antibiotic Prophylaxis:   Anti-infectives (From admission, onward)   None     Indication(s): None identified  Post-operative Assessment:  Post-procedure Vital Signs:  Pulse/HCG Rate: (!) 57(!) 58 Temp: 98.3 F (36.8 C) Resp: 17 BP: (!) 121/93 SpO2: 96 %  EBL: None  Complications: No immediate post-treatment complications observed by team, or reported by patient.  Note: The patient tolerated the entire procedure well. A repeat set of vitals were taken after the procedure and the patient was kept under observation following institutional policy, for this type of procedure. Post-procedural neurological assessment was performed, showing return to baseline, prior to discharge. The patient was provided with post-procedure discharge instructions, including a section on how to identify potential problems. Should any problems arise concerning this procedure, the patient was given instructions to immediately contact us, at any time, without hesitation. In any case, we plan to contact the patient by telephone for a follow-up status report regarding this interventional procedure.  Comments:  No additional  relevant information.  Plan of Care    Imaging Orders     DG C-Arm 1-60 Min-No Report  Procedure Orders     Lumbar Transforaminal Epidural  Medications ordered for procedure: Meds ordered this encounter  Medications  . lidocaine (XYLOCAINE) 2 % (with pres) injection 400 mg  . midazolam (VERSED) 5 MG/5ML injection 1-2 mg    Make sure Flumazenil is available in the pyxis when using this medication. If oversedation occurs, administer 0.2 mg IV over 15 sec. If after 45 sec no response, administer 0.2 mg again over 1  min; may repeat at 1 min intervals; not to exceed 4 doses (1 mg)  . fentaNYL (SUBLIMAZE) injection 25-50 mcg    Make sure Narcan is available in the pyxis when using this medication. In the event of respiratory depression (RR< 8/min): Titrate NARCAN (naloxone) in increments of 0.1 to 0.2 mg IV at 2-3 minute intervals, until desired degree of reversal.  . lactated ringers infusion 1,000 mL  . sodium chloride flush (NS) 0.9 % injection 1 mL  . ropivacaine (PF) 2 mg/mL (0.2%) (NAROPIN) injection 1 mL  . dexamethasone (DECADRON) injection 10 mg  . sodium chloride flush (NS) 0.9 % injection 1 mL  . ropivacaine (PF) 2 mg/mL (0.2%) (NAROPIN) injection 1 mL  . dexamethasone (DECADRON) injection 10 mg   Medications administered: We administered lidocaine, midazolam, fentaNYL, lactated ringers, sodium chloride flush, ropivacaine (PF) 2 mg/mL (0.2%), dexamethasone, sodium chloride flush, ropivacaine (PF) 2 mg/mL (0.2%), and dexamethasone.  See the medical record for exact dosing, route, and time of administration.  New Prescriptions   No medications on file   Disposition: Discharge home  Discharge Date & Time: 12/31/2017; 0949 hrs.   Physician-requested Follow-up: Return for post-procedure eval (2 wks), w/ Dr. Dossie Arbour.  Future Appointments  Date Time Provider [place]  01/08/2018  8:30 AM [id number]  01/08/2018  9:30 AM [facility]  01/13/2018   1:30 PM Milinda Pointer, MD [facility] None   Primary Care Physician: [facility] Location: [facility] Note by: [name], MD Date: 12/31/2017; Time: 10:06 AM  Disclaimer:  Medicine is not an exact science. The only guarantee in medicine is that nothing is guaranteed. It is important to note that the decision to proceed with this intervention was based on the information collected from the patient. The Data and conclusions were drawn from the patient's questionnaire, the interview, and the physical examination. Because the information was provided in large part by the patient, it cannot be guaranteed that it has not been purposely or unconsciously manipulated. Every effort has been made to obtain as much relevant data as possible for this evaluation. It is important to note that the conclusions that lead to this procedure are derived in large part from the available data. Always take into account that the treatment will also be dependent on availability of resources and existing treatment guidelines, considered by other Pain Management Practitioners as being common knowledge and practice, at the time of the intervention. For Medico-Legal purposes, it is also important to point out that variation in procedural techniques and pharmacological choices are the acceptable norm. The indications, contraindications, technique, and results of the above procedure should only be interpreted and judged by a Board-Certified Interventional Pain Specialist with extensive familiarity and expertise in the same exact procedure and technique.

## 2018-01-01 ENCOUNTER — Telehealth: Payer: Self-pay

## 2018-01-01 NOTE — Telephone Encounter (Signed)
Post procedure phone call.  Left message.  

## 2018-01-08 ENCOUNTER — Ambulatory Visit
Admission: RE | Admit: 2018-01-08 | Discharge: 2018-01-08 | Disposition: A | Discharge status: Home / Self Care | Payer: Medicaid Other | Source: Ambulatory Visit | Attending: Nurse Practitioner | Admitting: Nurse Practitioner

## 2018-01-08 ENCOUNTER — Ambulatory Visit
Admission: RE | Admit: 2018-01-08 | Discharge: 2018-01-08 | Disposition: A | Discharge status: Home / Self Care | Payer: Medicaid Other | Source: Ambulatory Visit | Attending: Pain Medicine | Admitting: Pain Medicine

## 2018-01-08 ENCOUNTER — Other Ambulatory Visit: Payer: Medicaid Other

## 2018-01-08 ENCOUNTER — Ambulatory Visit: Payer: Medicaid Other

## 2018-01-08 DIAGNOSIS — M532X7 Spinal instabilities, lumbosacral region: Secondary | ICD-10-CM

## 2018-01-08 DIAGNOSIS — M79605 Pain in left leg: Secondary | ICD-10-CM

## 2018-01-08 DIAGNOSIS — M48061 Spinal stenosis, lumbar region without neurogenic claudication: Secondary | ICD-10-CM

## 2018-01-08 DIAGNOSIS — M5442 Lumbago with sciatica, left side: Principal | ICD-10-CM

## 2018-01-08 DIAGNOSIS — M5136 Other intervertebral disc degeneration, lumbar region: Secondary | ICD-10-CM

## 2018-01-08 DIAGNOSIS — M4310 Spondylolisthesis, site unspecified: Secondary | ICD-10-CM

## 2018-01-08 DIAGNOSIS — G8929 Other chronic pain: Secondary | ICD-10-CM

## 2018-01-08 DIAGNOSIS — M79604 Pain in right leg: Secondary | ICD-10-CM

## 2018-01-08 DIAGNOSIS — M5417 Radiculopathy, lumbosacral region: Secondary | ICD-10-CM

## 2018-01-08 DIAGNOSIS — M4317 Spondylolisthesis, lumbosacral region: Secondary | ICD-10-CM

## 2018-01-08 DIAGNOSIS — M545 Low back pain: Principal | ICD-10-CM

## 2018-01-08 DIAGNOSIS — M4807 Spinal stenosis, lumbosacral region: Secondary | ICD-10-CM

## 2018-01-08 DIAGNOSIS — M5416 Radiculopathy, lumbar region: Secondary | ICD-10-CM

## 2018-01-08 DIAGNOSIS — M5441 Lumbago with sciatica, right side: Principal | ICD-10-CM

## 2018-01-12 DIAGNOSIS — H65412 Chronic allergic otitis media, left ear: Secondary | ICD-10-CM | POA: Insufficient documentation

## 2018-01-12 DIAGNOSIS — L309 Dermatitis, unspecified: Secondary | ICD-10-CM | POA: Insufficient documentation

## 2018-01-13 ENCOUNTER — Ambulatory Visit: Payer: Medicaid Other | Admitting: Pain Medicine

## 2018-01-13 ENCOUNTER — Other Ambulatory Visit: Payer: Self-pay | Admitting: Pain Medicine

## 2018-01-13 DIAGNOSIS — M5417 Radiculopathy, lumbosacral region: Secondary | ICD-10-CM

## 2018-01-13 DIAGNOSIS — M5136 Other intervertebral disc degeneration, lumbar region: Secondary | ICD-10-CM

## 2018-01-13 DIAGNOSIS — M4310 Spondylolisthesis, site unspecified: Secondary | ICD-10-CM

## 2018-01-13 DIAGNOSIS — M48061 Spinal stenosis, lumbar region without neurogenic claudication: Secondary | ICD-10-CM

## 2018-01-13 DIAGNOSIS — M532X7 Spinal instabilities, lumbosacral region: Secondary | ICD-10-CM

## 2018-01-13 DIAGNOSIS — M47816 Spondylosis without myelopathy or radiculopathy, lumbar region: Secondary | ICD-10-CM

## 2018-01-13 DIAGNOSIS — M5416 Radiculopathy, lumbar region: Secondary | ICD-10-CM

## 2018-01-13 DIAGNOSIS — M79604 Pain in right leg: Secondary | ICD-10-CM

## 2018-01-13 DIAGNOSIS — M5441 Lumbago with sciatica, right side: Principal | ICD-10-CM

## 2018-01-13 DIAGNOSIS — G8929 Other chronic pain: Secondary | ICD-10-CM

## 2018-01-13 DIAGNOSIS — M5442 Lumbago with sciatica, left side: Principal | ICD-10-CM

## 2018-01-13 DIAGNOSIS — M79605 Pain in left leg: Secondary | ICD-10-CM

## 2018-02-01 ENCOUNTER — Ambulatory Visit: Payer: Medicaid Other | Admitting: Pain Medicine

## 2018-02-16 DIAGNOSIS — I890 Lymphedema, not elsewhere classified: Secondary | ICD-10-CM | POA: Insufficient documentation

## 2018-03-31 DIAGNOSIS — R197 Diarrhea, unspecified: Secondary | ICD-10-CM | POA: Insufficient documentation

## 2018-04-20 DIAGNOSIS — Z01818 Encounter for other preprocedural examination: Secondary | ICD-10-CM | POA: Insufficient documentation

## 2018-04-20 DIAGNOSIS — Z0000 Encounter for general adult medical examination without abnormal findings: Secondary | ICD-10-CM | POA: Insufficient documentation

## 2018-05-07 DIAGNOSIS — Z95810 Presence of automatic (implantable) cardiac defibrillator: Secondary | ICD-10-CM | POA: Insufficient documentation

## 2018-07-09 DIAGNOSIS — E876 Hypokalemia: Secondary | ICD-10-CM | POA: Insufficient documentation

## 2018-07-09 DIAGNOSIS — R001 Bradycardia, unspecified: Secondary | ICD-10-CM | POA: Insufficient documentation

## 2018-08-14 DIAGNOSIS — R300 Dysuria: Secondary | ICD-10-CM | POA: Insufficient documentation

## 2018-08-20 DIAGNOSIS — R630 Anorexia: Secondary | ICD-10-CM | POA: Insufficient documentation

## 2018-08-25 DIAGNOSIS — K5792 Diverticulitis of intestine, part unspecified, without perforation or abscess without bleeding: Secondary | ICD-10-CM | POA: Insufficient documentation

## 2018-08-27 DIAGNOSIS — K859 Acute pancreatitis without necrosis or infection, unspecified: Secondary | ICD-10-CM | POA: Insufficient documentation

## 2018-08-28 DIAGNOSIS — R600 Localized edema: Secondary | ICD-10-CM | POA: Insufficient documentation

## 2018-09-11 DIAGNOSIS — I959 Hypotension, unspecified: Secondary | ICD-10-CM | POA: Insufficient documentation

## 2018-10-18 DIAGNOSIS — R52 Pain, unspecified: Secondary | ICD-10-CM | POA: Insufficient documentation

## 2018-10-18 DIAGNOSIS — Z95810 Presence of automatic (implantable) cardiac defibrillator: Secondary | ICD-10-CM | POA: Insufficient documentation

## 2018-11-05 DIAGNOSIS — H6983 Other specified disorders of Eustachian tube, bilateral: Secondary | ICD-10-CM | POA: Insufficient documentation

## 2018-11-05 DIAGNOSIS — J40 Bronchitis, not specified as acute or chronic: Secondary | ICD-10-CM | POA: Insufficient documentation

## 2018-11-18 DIAGNOSIS — K921 Melena: Secondary | ICD-10-CM | POA: Insufficient documentation

## 2018-11-21 DIAGNOSIS — A0472 Enterocolitis due to Clostridium difficile, not specified as recurrent: Secondary | ICD-10-CM | POA: Insufficient documentation

## 2019-01-15 DIAGNOSIS — R102 Pelvic and perineal pain: Secondary | ICD-10-CM | POA: Insufficient documentation

## 2019-03-18 ENCOUNTER — Ambulatory Visit
Admission: EM | Admit: 2019-03-18 | Discharge: 2019-03-18 | Disposition: A | Discharge status: Home / Self Care | Payer: Medicaid Other | Attending: Family Medicine | Admitting: Family Medicine

## 2019-03-18 ENCOUNTER — Encounter: Payer: Self-pay | Admitting: Emergency Medicine

## 2019-03-18 ENCOUNTER — Other Ambulatory Visit: Payer: Self-pay

## 2019-03-18 DIAGNOSIS — R479 Unspecified speech disturbances: Secondary | ICD-10-CM | POA: Insufficient documentation

## 2019-03-18 DIAGNOSIS — R519 Headache, unspecified: Secondary | ICD-10-CM

## 2019-03-18 DIAGNOSIS — R51 Headache: Secondary | ICD-10-CM | POA: Insufficient documentation

## 2019-03-18 LAB — GLUCOSE, CAPILLARY: Glucose-Capillary: 97 mg/dL (ref 70–99)

## 2019-03-18 NOTE — ED Provider Notes (Signed)
[facility] ____________________________________________  Time seen: Approximately 11:00 AM  I have reviewed the triage vital signs and the nursing notes.   HISTORY  Chief Complaint Headache   HPI Claire Mcdonald is a 53 y.o. female with past medical history of CVA, CAD, DVT, hypertension, hyperlipidemia and seizures presenting for evaluation of headache.  Reports headache is been present as a constant headache for the last 4 weeks but has acutely increased in pain and throbbing in the last 24 hours.  States last night around 10 PM when she was talking to her son on the phone it was noted that she was slurring her words and "talking out of my head ".  States some of the slurring of words and not speaking clearly has first visit into today.  Reports she does have a chronic right facial, arm and leg paresthesias and weakness from previous stroke without any acute obvious changes.  Does have a history of migraines, but reports has not had migraines in many years.  Feels off balance, this is also chronic, no known acute changes.  Last night she did have some chest discomfort accompanying this.  Denies chest pain now.  No shortness of breath, fevers, cough or recent sickness.  Physicians, [employer]: PCP   Past Medical History:  Diagnosis Date  . Allergy   . Anxiety   . Asthma 05/06/2016  . Coronary artery disease   . DDD (degenerative disc disease), cervical   . Depression   . Diverticulosis    seen on CT 2015  . DVT (deep venous thrombosis) ([place])   . GERD (gastroesophageal reflux disease)   . Heart failure ([place])   . High cholesterol   . Hyperlipidemia   . Hypertension   . Idiopathic cardiac arrest ([place]) 02/2015   V. fib, EF nl, non-obs CAD; St. Jude ICD  . Migraine   . Obesity (BMI 30-39.9)   . Seizures ([place])   . Ventricular tachycardia [facility])     Patient Active Problem List   Diagnosis Date Noted  . Vitamin D deficiency 12/16/2017  . History of Allergy to iodine  12/16/2017    Class: History of  . History of allergy to radiographic contrast media 12/16/2017    Class: History of  . History of placement of internal cardiac defibrillator 12/16/2017    Class: History of  . Grade 2-3 (13-16 mm) Anterolisthesis of L5 over S1 12/15/2017  . Lumbar foraminal stenosis (L5-S1) (Bilateral) 12/15/2017  . Chronic lumbar radicular pain (S1) (Bilateral) 12/15/2017  . Chronic musculoskeletal pain 12/15/2017  . Neurogenic pain 12/15/2017  . Spinal instability of lumbosacral region (L5-S1) 12/15/2017  . Lumbosacral radiculopathy at S1 12/15/2017  . History of DVT (deep vein thrombosis) 11/30/2017  . Chronic low back pain (Primary Area of Pain) (Bilateral) (R>L) 11/30/2017  . Chronic lower extremity pain (Secondary Area of Pain) (Bilateral) (R>L) 11/30/2017  . Chronic pain syndrome 11/30/2017  . Long term current use of opiate analgesic 11/30/2017  . Pharmacologic therapy 11/30/2017  . Disorder of skeletal system 11/30/2017  . Problems influencing health status 11/30/2017  . Urinary incontinence 11/16/2017  . Frequent falls 10/09/2017  . Mental disorder follow-up 10/09/2017  . 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 Right hip pain 07/03/2017  . Left-sided weakness 02/20/2017  . Transient neurological symptoms 12/04/2016  . Food insecurity 11/02/2016  . Ischemic stroke ([place]) 10/19/2016  . Diverticulosis of large intestine without hemorrhage 09/19/2016  . Hiatal hernia 09/01/2016  . Migraine  headache 09/01/2016  . Asthma 05/06/2016  . Hernia, incisional 05/06/2016  . Bilateral carpal tunnel syndrome 12/28/2015  . Moderate episode of recurrent major depressive disorder ([place]) 12/10/2015  . Atypical squamous cell changes of undetermined significance (ASCUS) on cervical cytology with positive high risk human papilloma virus (HPV) 10/15/2015  . Intramural leiomyoma of uterus 10/14/2015  . Anxiety 10/12/2015  . Gastroesophageal  reflux disease without esophagitis 10/12/2015  . Essential hypertension 09/13/2015  . Hyperlipidemia 09/13/2015  . Deep vein thrombosis (DVT) ([place]) 09/13/2015  . 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 Ventricular tachycardia ([place]) 07/16/2015  . CAD (coronary artery disease) 04/16/2015  . Seizure disorder ([place]) 04/16/2015  . Heart failure ([place]) 04/16/2015  . Normal coronary arteries 04/09/2015  . Obesity (BMI 35.0-39.9 without comorbidity) 04/09/2015  . Pulmonary nodule seen on imaging study 04/09/2015  . Chronic anticoagulation (Xarelto) 04/09/2015  . Cough   . Dyspnea   . Cardiac arrest; V. fib, St. Jude ICD 03/25/2015  . DDD (degenerative disc disease), lumbar 01/28/2015  . Lumbar facet arthropathy 01/28/2015  . Sacroiliac joint disease 01/28/2015    Past Surgical History:  Procedure Laterality Date  . CARDIAC CATHETERIZATION N/A 03/27/2015   Procedure: Left Heart Cath and Coronary Angiography;  Surgeon: Troy Sine, MD;  LAD 10%/30%, CFX and RCA OK, EF normal  . CENTRAL LINE  03/25/2015      . COLONOSCOPY WITH PROPOFOL N/A 10/01/2015   Procedure: COLONOSCOPY WITH PROPOFOL;  Surgeon: Josefine Class, MD;  Location: [facility] ENDOSCOPY;  Service: Endoscopy;  Laterality: N/A;  . EP IMPLANTABLE DEVICE N/A 04/11/2015   Procedure: ICD Implant;  Surgeon: Deboraha Sprang, MD;  [facility] ICD, serial number [id number]  . EP IMPLANTABLE DEVICE N/A 07/16/2015   Procedure: Lead Revision/Repair;  Surgeon: Will Meredith Leeds, MD;  Location: [place] [facility];  Service: Cardiovascular;  Laterality: N/A;  . ESOPHAGOGASTRODUODENOSCOPY (EGD) WITH PROPOFOL N/A 10/01/2015   Procedure: ESOPHAGOGASTRODUODENOSCOPY (EGD) WITH PROPOFOL;  Surgeon: Josefine Class, MD;  Location: [facility] ENDOSCOPY;  Service: Endoscopy;  Laterality: N/A;  . HAND SURGERY Left   . OVARIAN CYST REMOVAL Left   . TUBAL LIGATION  1998     No current facility-administered medications for this  encounter.   Current Outpatient Medications:  .  albuterol (PROVENTIL HFA;VENTOLIN HFA) 108 (90 BASE) MCG/ACT inhaler, Inhale 2 puffs into the lungs every 6 (six) hours as needed for wheezing or shortness of breath., Disp: 1 Inhaler, Rfl: 0 .  atorvastatin (LIPITOR) 20 MG tablet, Take 1 tablet (20 mg total) by mouth daily at 6 PM. (Patient taking differently: Take 40 mg by mouth daily at 6 PM. ), Disp: 90 tablet, Rfl: 1 .  Calcium Carbonate-Vit D-Min (GNP CALCIUM 1200) 1200-1000 MG-UNIT CHEW, Chew 1,200 mg by mouth daily with breakfast. Take in combination with vitamin D and magnesium., Disp: 30 tablet, Rfl: 5 .  citalopram (CELEXA) 40 MG tablet, Take 40 mg by mouth daily., Disp: , Rfl:  .  furosemide (LASIX) 20 MG tablet, Take 20 mg by mouth daily and may take an additional daily as directed (Patient taking differently: 40 mg. Take 20 mg by mouth daily and may take an additional daily as directed), Disp: 90 tablet, Rfl: 3 .  gabapentin (NEURONTIN) 300 MG capsule, Take 300 mg by mouth daily., Disp: , Rfl:  .  lisinopril (PRINIVIL,ZESTRIL) 5 MG tablet, Take 5 mg by mouth daily., Disp: , Rfl:  .  Magnesium 400 MG TABS, Take 400 mg by mouth  daily., Disp: , Rfl:  .  metoprolol tartrate (LOPRESSOR) 25 MG tablet, Take 0.5 tablets (12.5 mg total) by mouth 2 (two) times daily. (Patient taking differently: Take 25 mg by mouth 2 (two) times daily. ), Disp: 60 tablet, Rfl: 1 .  rivaroxaban (XARELTO) 20 MG TABS tablet, Take 20 mg by mouth daily with supper., Disp: , Rfl:  .  topiramate (TOPAMAX) 100 MG tablet, Take 1 tablet (100 mg total) by mouth 2 (two) times daily., Disp: 60 tablet, Rfl: 1 .  amitriptyline (ELAVIL) 25 MG tablet, Take 25 mg by mouth at bedtime., Disp: , Rfl:  .  diphenhydrAMINE (BENADRYL) 50 MG capsule, Take 1 capsule (50 mg total) by mouth PRO for 1 day. Take 50 mg by mouth 1 hour before procedure. Must have a driver., Disp: 10 capsule, Rfl: 0 .  predniSONE (DELTASONE) 50 MG tablet, Take 1  tablet (50 mg total) by mouth PRO for 1 day. Take 1 tab PO 13 hrs, 7 hrs, and 1 hr before contrast media injection (procedure)., Disp: 10 tablet, Rfl: 0  Allergies Iodinated diagnostic agents, Toradol [ketorolac tromethamine], Tramadol, and Zofran [ondansetron hcl]  Family History  Problem Relation Age of Onset  . Diabetes Father   . Asthma Father   . Hyperlipidemia Father   . Hypertension Father   . Kidney disease Father   . Asthma Mother   . Depression Mother   . Hyperlipidemia Mother   . Hypertension Mother   . Cancer Maternal Grandmother   . Early death Maternal Grandmother   . Breast cancer Maternal Grandmother   . Early death Paternal Grandmother   . Allergies Daughter   . Allergies Son   . Allergies Son   . Heart disease Son   . Breast cancer Paternal Aunt   . Stroke Neg Hx     Social History Social History   Tobacco Use  . Smoking status: Never Smoker  . Smokeless tobacco: Never Used  Substance Use Topics  . Alcohol use: No  . Drug use: No    Review of Systems Constitutional: No fever Eyes: Reports some blurred vision in the last few days.  No vision loss or gaps. ENT: No sore throat. Cardiovascular: Denies chest pain. Respiratory: Denies shortness of breath. Gastrointestinal: No abdominal pain.  Positive nausea.  Vomiting last night.  No diarrhea.   Musculoskeletal: Right-sided weakness. Skin: Negative for rash. Neurological: As above  ____________________________________________   PHYSICAL EXAM:  VITAL SIGNS: ED Triage Vitals  Enc Vitals Group     BP 03/18/19 1006 (!) 132/93     Pulse Rate 03/18/19 1006 60     Resp 03/18/19 1006 16     Temp 03/18/19 1006 98.1 F (36.7 C)     Temp Source 03/18/19 1006 Oral     SpO2 03/18/19 1006 98 %     Weight 03/18/19 1001 156 lb (70.8 kg)     Height 03/18/19 1001 5\' 2"  (1.575 m)     Head Circumference --      Peak Flow --      Pain Score 03/18/19 1001 9     Pain Loc --      Pain Edu? --      Excl. in  [place]? --     Constitutional: Alert and oriented. Well appearing and in no acute distress. Eyes: Conjunctivae are normal.  ENT      Head: Normocephalic and atraumatic. Cardiovascular: Normal rate, regular rhythm. Grossly normal heart sounds.  Good peripheral circulation. Respiratory:  Normal respiratory effort without tachypnea nor retractions. Breath sounds are clear and equal bilaterally. No wheezes, rales, rhonchi. Gastrointestinal: Soft and nontender. Musculoskeletal: Ambulatory. Neurologic:  Normal speech and language.  Speech normal.  Ambulates with cane, unsteady.  Right face, right arm and right leg paresthesia noted.  Right hand grip weaker than left.  Negative pronator drift. Skin:  Skin is warm, dry and intact. No rash noted. Psychiatric: Mood and affect are normal. Speech and behavior are normal. Patient exhibits appropriate insight and judgment   ___________________________________________   LABS (all labs ordered are listed, but only abnormal results are displayed)  Labs Reviewed  GLUCOSE, CAPILLARY  CBG MONITORING, ED   ____________________________________________  EKG  ED ECG REPORT I, Marylene Land, the attending provider, personally viewed and interpreted this ECG.   Date: 03/18/2019  EKG Time: 1039  Rate: 52  Rhythm: Sinus bradycardia  Axis: normal  Intervals:none  ST&T Change: Nonspecific T wave abnormality   PROCEDURES Procedures   INITIAL IMPRESSION / ASSESSMENT AND PLAN / ED COURSE  Pertinent labs & imaging results that were available during my care of the patient were reviewed by me and considered in my medical decision making (see chart for details).   Patient with extensive past medical history and comorbidities.  Presenting for headache present for 4 weeks with acute worsening in the last 24 hours with new onset of speech difficulty.  Speech difficulty onset 10 PM last night.  Patient does have chronic deficits from previous CVA in 2018.  At  this time concern for acute TIA versus CVA.  Recommend further evaluation in ER.  Recommend EMS transfer, patient to be transferred by EMS to [facility].  Patient agreed with this plan. ____________________________________________   FINAL CLINICAL IMPRESSION(S) / ED DIAGNOSES  Final diagnoses:  Acute intractable headache, unspecified headache type  Difficulty with speech     ED Discharge Orders    None       Note: This dictation was prepared with Dragon dictation along with smaller phrase technology. Any transcriptional errors that result from this process are unintentional.         Marylene Land, NP 03/18/19 1136

## 2019-03-18 NOTE — ED Triage Notes (Signed)
Patient c/o HAs for the past 4 weeks.  Patient reports speech problems that last night.  Patient has history of stroke.   Patient reports some nausea.  Patient denies V/D.

## 2019-03-27 ENCOUNTER — Other Ambulatory Visit: Payer: Self-pay

## 2019-03-27 ENCOUNTER — Emergency Department: Payer: Medicaid Other

## 2019-03-27 ENCOUNTER — Encounter: Payer: Self-pay | Admitting: Emergency Medicine

## 2019-03-27 ENCOUNTER — Emergency Department
Admission: EM | Admit: 2019-03-27 | Discharge: 2019-03-27 | Disposition: A | Discharge status: Home / Self Care | Payer: Medicaid Other | Attending: Emergency Medicine | Admitting: Emergency Medicine

## 2019-03-27 DIAGNOSIS — I10 Essential (primary) hypertension: Secondary | ICD-10-CM | POA: Diagnosis not present

## 2019-03-27 DIAGNOSIS — J45909 Unspecified asthma, uncomplicated: Secondary | ICD-10-CM | POA: Insufficient documentation

## 2019-03-27 DIAGNOSIS — Z79899 Other long term (current) drug therapy: Secondary | ICD-10-CM | POA: Diagnosis not present

## 2019-03-27 DIAGNOSIS — I2510 Atherosclerotic heart disease of native coronary artery without angina pectoris: Secondary | ICD-10-CM | POA: Diagnosis not present

## 2019-03-27 DIAGNOSIS — R1011 Right upper quadrant pain: Secondary | ICD-10-CM | POA: Diagnosis not present

## 2019-03-27 DIAGNOSIS — R109 Unspecified abdominal pain: Secondary | ICD-10-CM

## 2019-03-27 LAB — CBC WITH DIFFERENTIAL/PLATELET
Abs Immature Granulocytes: 0.01 10*3/uL (ref 0.00–0.07)
Basophils Absolute: 0 10*3/uL (ref 0.0–0.1)
Basophils Relative: 1 %
Eosinophils Absolute: 0.2 10*3/uL (ref 0.0–0.5)
Eosinophils Relative: 3 %
HCT: 41.5 % (ref 36.0–46.0)
Hemoglobin: 14 g/dL (ref 12.0–15.0)
Immature Granulocytes: 0 %
Lymphocytes Relative: 31 %
Lymphs Abs: 1.9 10*3/uL (ref 0.7–4.0)
MCH: 30.6 pg (ref 26.0–34.0)
MCHC: 33.7 g/dL (ref 30.0–36.0)
MCV: 90.6 fL (ref 80.0–100.0)
Monocytes Absolute: 0.5 10*3/uL (ref 0.1–1.0)
Monocytes Relative: 8 %
Neutro Abs: 3.6 10*3/uL (ref 1.7–7.7)
Neutrophils Relative %: 57 %
Platelets: 257 10*3/uL (ref 150–400)
RBC: 4.58 MIL/uL (ref 3.87–5.11)
RDW: 13.6 % (ref 11.5–15.5)
WBC: 6.2 10*3/uL (ref 4.0–10.5)
nRBC: 0 % (ref 0.0–0.2)

## 2019-03-27 LAB — COMPREHENSIVE METABOLIC PANEL
ALT: 12 U/L (ref 0–44)
AST: 17 U/L (ref 15–41)
Albumin: 4 g/dL (ref 3.5–5.0)
Alkaline Phosphatase: 75 U/L (ref 38–126)
Anion gap: 9 (ref 5–15)
BUN: 13 mg/dL (ref 6–20)
CO2: 29 mmol/L (ref 22–32)
Calcium: 9.2 mg/dL (ref 8.9–10.3)
Chloride: 103 mmol/L (ref 98–111)
Creatinine, Ser: 0.8 mg/dL (ref 0.44–1.00)
GFR calc Af Amer: >60 mL/min (ref >60)
GFR calc non Af Amer: >60 mL/min (ref >60)
Glucose, Bld: 94 mg/dL (ref 70–99)
Potassium: 3.8 mmol/L (ref 3.5–5.1)
Sodium: 141 mmol/L (ref 135–145)
Total Bilirubin: 0.7 mg/dL (ref 0.3–1.2)
Total Protein: 6.9 g/dL (ref 6.5–8.1)

## 2019-03-27 LAB — LIPASE, BLOOD: Lipase: 29 U/L (ref 11–51)

## 2019-03-27 MED ORDER — SODIUM CHLORIDE 0.9 % IV BOLUS
1000.0000 mL | Freq: Once | INTRAVENOUS | Status: AC
  Administered 2019-03-27: 1000 mL via INTRAVENOUS

## 2019-03-27 MED ORDER — SUCRALFATE 1 G PO TABS: 1.0000 g | ORAL_TABLET | Freq: Four times a day (QID) | ORAL | 0 refills | Status: DC

## 2019-03-27 MED ORDER — HALOPERIDOL LACTATE 5 MG/ML IJ SOLN
5.0000 mg | Freq: Once | INTRAMUSCULAR | Status: AC
  Administered 2019-03-27: 5 mg via INTRAVENOUS
  Filled 2019-03-27: qty 1

## 2019-03-27 MED ORDER — FAMOTIDINE 20 MG PO TABS: 20.0000 mg | ORAL_TABLET | Freq: Two times a day (BID) | ORAL | 1 refills | Status: AC

## 2019-03-27 NOTE — ED Notes (Signed)
MD Goodman at bedside. 

## 2019-03-27 NOTE — ED Provider Notes (Signed)
[facility] Emergency Department Provider Note   ____________________________________________   I have reviewed the triage vital signs and the nursing notes.   HISTORY  Chief Complaint Abdominal Pain   History limited by: Not Limited   HPI ELLISHA Mcdonald is a 53 y.o. female who presents to the emergency department today because of concern for abdominal pain. States that it started yesterday. Located in the right abdomen. Radiates to her back. The patient has had associated nausea and vomiting. She denies any unusual activity when the pain started or having just eaten. Denies similar pain in the past. Has not noticed any change in defecation or urination.  Records reviewed. Per medical record review patient has a history of diverticulosis.   Past Medical History:  Diagnosis Date  . Allergy   . Anxiety   . Asthma 05/06/2016  . Coronary artery disease   . DDD (degenerative disc disease), cervical   . Depression   . Diverticulosis    seen on CT 2015  . DVT (deep venous thrombosis) ([place])   . GERD (gastroesophageal reflux disease)   . Heart failure ([place])   . High cholesterol   . Hyperlipidemia   . Hypertension   . Idiopathic cardiac arrest ([place]) 02/2015   V. fib, EF nl, non-obs CAD; St. Jude ICD  . Migraine   . Obesity (BMI 30-39.9)   . Seizures ([place])   . Ventricular tachycardia [facility])     Patient Active Problem List   Diagnosis Date Noted  . Vitamin D deficiency 12/16/2017  . History of Allergy to iodine 12/16/2017    Class: History of  . History of allergy to radiographic contrast media 12/16/2017    Class: History of  . History of placement of internal cardiac defibrillator 12/16/2017    Class: History of  . Grade 2-3 (13-16 mm) Anterolisthesis of L5 over S1 12/15/2017  . Lumbar foraminal stenosis (L5-S1) (Bilateral) 12/15/2017  . Chronic lumbar radicular pain (S1) (Bilateral) 12/15/2017  . Chronic musculoskeletal pain 12/15/2017  . Neurogenic  pain 12/15/2017  . Spinal instability of lumbosacral region (L5-S1) 12/15/2017  . Lumbosacral radiculopathy at S1 12/15/2017  . History of DVT (deep vein thrombosis) 11/30/2017  . Chronic low back pain (Primary Area of Pain) (Bilateral) (R>L) 11/30/2017  . Chronic lower extremity pain (Secondary Area of Pain) (Bilateral) (R>L) 11/30/2017  . Chronic pain syndrome 11/30/2017  . Long term current use of opiate analgesic 11/30/2017  . Pharmacologic therapy 11/30/2017  . Disorder of skeletal system 11/30/2017  . Problems influencing health status 11/30/2017  . Urinary incontinence 11/16/2017  . Frequent falls 10/09/2017  . Mental disorder follow-up 10/09/2017  . 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 Right hip pain 07/03/2017  . Left-sided weakness 02/20/2017  . Transient neurological symptoms 12/04/2016  . Food insecurity 11/02/2016  . Ischemic stroke ([place]) 10/19/2016  . Diverticulosis of large intestine without hemorrhage 09/19/2016  . Hiatal hernia 09/01/2016  . Migraine headache 09/01/2016  . Asthma 05/06/2016  . Hernia, incisional 05/06/2016  . Bilateral carpal tunnel syndrome 12/28/2015  . Moderate episode of recurrent major depressive disorder ([place]) 12/10/2015  . Atypical squamous cell changes of undetermined significance (ASCUS) on cervical cytology with positive high risk human papilloma virus (HPV) 10/15/2015  . Intramural leiomyoma of uterus 10/14/2015  . Anxiety 10/12/2015  . Gastroesophageal reflux disease without esophagitis 10/12/2015  . Essential hypertension 09/13/2015  . Hyperlipidemia 09/13/2015  . Deep vein thrombosis (DVT) ([place]) 09/13/2015  . ICD (implantable  cardioverter-defibrillator) lead failure 07/16/2015  . Ventricular tachycardia ([place]) 07/16/2015  . CAD (coronary artery disease) 04/16/2015  . Seizure disorder ([place]) 04/16/2015  . Heart failure ([place]) 04/16/2015  . Normal coronary arteries 04/09/2015  . Obesity (BMI  35.0-39.9 without comorbidity) 04/09/2015  . Pulmonary nodule seen on imaging study 04/09/2015  . Chronic anticoagulation (Xarelto) 04/09/2015  . Cough   . Dyspnea   . Cardiac arrest; V. fib, St. Jude ICD 03/25/2015  . DDD (degenerative disc disease), lumbar 01/28/2015  . Lumbar facet arthropathy 01/28/2015  . Sacroiliac joint disease 01/28/2015    Past Surgical History:  Procedure Laterality Date  . CARDIAC CATHETERIZATION N/A 03/27/2015   Procedure: Left Heart Cath and Coronary Angiography;  Surgeon: Troy Sine, MD;  LAD 10%/30%, CFX and RCA OK, EF normal  . CENTRAL LINE  03/25/2015      . COLONOSCOPY WITH PROPOFOL N/A 10/01/2015   Procedure: COLONOSCOPY WITH PROPOFOL;  Surgeon: Josefine Class, MD;  Location: [facility] ENDOSCOPY;  Service: Endoscopy;  Laterality: N/A;  . EP IMPLANTABLE DEVICE N/A 04/11/2015   Procedure: ICD Implant;  Surgeon: Deboraha Sprang, MD;  [facility] ICD, serial number [id number]  . EP IMPLANTABLE DEVICE N/A 07/16/2015   Procedure: Lead Revision/Repair;  Surgeon: Will Meredith Leeds, MD;  Location: [place] CV LAB;  Service: Cardiovascular;  Laterality: N/A;  . ESOPHAGOGASTRODUODENOSCOPY (EGD) WITH PROPOFOL N/A 10/01/2015   Procedure: ESOPHAGOGASTRODUODENOSCOPY (EGD) WITH PROPOFOL;  Surgeon: Josefine Class, MD;  Location: [facility] ENDOSCOPY;  Service: Endoscopy;  Laterality: N/A;  . HAND SURGERY Left   . OVARIAN CYST REMOVAL Left   . TUBAL LIGATION  1998    Prior to Admission medications   Medication Sig Start Date End Date Taking? Authorizing Provider  albuterol (PROVENTIL HFA;VENTOLIN HFA) 108 (90 BASE) MCG/ACT inhaler Inhale 2 puffs into the lungs every 6 (six) hours as needed for wheezing or shortness of breath. 01/23/15   Menshew, Dannielle Karvonen, PA-C  amitriptyline (ELAVIL) 25 MG tablet Take 25 mg by mouth at bedtime.    [provider]  atorvastatin (LIPITOR) 20 MG tablet Take 1 tablet (20 mg total) by mouth daily at 6 PM. Patient taking  differently: Take 40 mg by mouth daily at 6 PM.  07/06/15   [name], NP  Calcium Carbonate-Vit D-Min (GNP CALCIUM 1200) 1200-1000 MG-UNIT CHEW Chew 1,200 mg by mouth daily with breakfast. Take in combination with vitamin D and magnesium. 12/16/17 03/18/19  Milinda Pointer, MD  citalopram (CELEXA) 40 MG tablet Take 40 mg by mouth daily.    [provider]  diphenhydrAMINE (BENADRYL) 50 MG capsule Take 1 capsule (50 mg total) by mouth PRO for 1 day. Take 50 mg by mouth 1 hour before procedure. Must have a driver. 12/16/17 12/17/17  Milinda Pointer, MD  furosemide (LASIX) 20 MG tablet Take 20 mg by mouth daily and may take an additional daily as directed Patient taking differently: 40 mg. Take 20 mg by mouth daily and may take an additional daily as directed 09/21/15   Deboraha Sprang, MD  gabapentin (NEURONTIN) 300 MG capsule Take 300 mg by mouth daily.    [provider]  lisinopril (PRINIVIL,ZESTRIL) 5 MG tablet Take 5 mg by mouth daily.    [provider]  Magnesium 400 MG TABS Take 400 mg by mouth daily.    [provider]  metoprolol tartrate (LOPRESSOR) 25 MG tablet Take 0.5 tablets (12.5 mg total) by mouth 2 (two) times daily. Patient taking differently:  Take 25 mg by mouth 2 (two) times daily.  08/03/15   [name], NP  predniSONE (DELTASONE) 50 MG tablet Take 1 tablet (50 mg total) by mouth PRO for 1 day. Take 1 tab PO 13 hrs, 7 hrs, and 1 hr before contrast media injection (procedure). 12/16/17 12/17/17  Milinda Pointer, MD  rivaroxaban (XARELTO) 20 MG TABS tablet Take 20 mg by mouth daily with supper.    [provider]  topiramate (TOPAMAX) 100 MG tablet Take 1 tablet (100 mg total) by mouth 2 (two) times daily. 04/12/15   Kinnie Feil, MD    Allergies Iodinated diagnostic agents, Toradol [ketorolac tromethamine], Tramadol, and Zofran [ondansetron hcl]  Family History  Problem Relation Age of Onset  . Diabetes Father   .  Asthma Father   . Hyperlipidemia Father   . Hypertension Father   . Kidney disease Father   . Asthma Mother   . Depression Mother   . Hyperlipidemia Mother   . Hypertension Mother   . Cancer Maternal Grandmother   . Early death Maternal Grandmother   . Breast cancer Maternal Grandmother   . Early death Paternal Grandmother   . Allergies Daughter   . Allergies Son   . Allergies Son   . Heart disease Son   . Breast cancer Paternal Aunt   . Stroke Neg Hx     Social History Social History   Tobacco Use  . Smoking status: Never Smoker  . Smokeless tobacco: Never Used  Substance Use Topics  . Alcohol use: No  . Drug use: No    Review of Systems Constitutional: No fever/chills Eyes: No visual changes. ENT: No sore throat. Cardiovascular: Denies chest pain. Respiratory: Denies shortness of breath. Gastrointestinal: Positive for abdominal pain and vomiting.  Genitourinary: Negative for dysuria. Musculoskeletal: Negative for back pain. Skin: Negative for rash. Neurological: Negative for headaches, focal weakness or numbness.  ____________________________________________   PHYSICAL EXAM:  VITAL SIGNS: ED Triage Vitals  Enc Vitals Group     BP 03/27/19 0745 115/67     Pulse Rate 03/27/19 0745 76     Resp 03/27/19 0745 16     Temp 03/27/19 0745 98.2 F (36.8 C)     Temp Source 03/27/19 0745 Oral     SpO2 03/27/19 0745 95 %     Weight 03/27/19 0747 152 lb (68.9 kg)     Height 03/27/19 0747 5\' 2"  (1.575 m)     Head Circumference --      Peak Flow --      Pain Score 03/27/19 0747 10   Constitutional: Alert and oriented.  Eyes: Conjunctivae are normal.  ENT      Head: Normocephalic and atraumatic.      Nose: No congestion/rhinnorhea.      Mouth/Throat: Mucous membranes are moist.      Neck: No stridor. Hematological/Lymphatic/Immunilogical: No cervical lymphadenopathy. Cardiovascular: Normal rate, regular rhythm.  No murmurs, rubs, or gallops.  Respiratory:  Normal respiratory effort without tachypnea nor retractions. Breath sounds are clear and equal bilaterally. No wheezes/rales/rhonchi. Gastrointestinal: Soft and tender to palpation to the right side. Genitourinary: Deferred Musculoskeletal: Normal range of motion in all extremities. No lower extremity edema. Neurologic:  Normal speech and language. No gross focal neurologic deficits are appreciated.  Skin:  Skin is warm, dry and intact. No rash noted. Psychiatric: Mood and affect are normal. Speech and behavior are normal. Patient exhibits appropriate insight and judgment.  ____________________________________________    LABS (pertinent positives/negatives)  Lipase 29 CBC wbc 6.2, hgb 14.0, plt 257 CMP wnl  ____________________________________________   EKG  I, Nance Pear, attending physician, personally viewed and interpreted this EKG  EKG Time: 0803 Rate: 65 Rhythm: normal sinus rhythm Axis: right axis deviation Intervals: qtc 459 QRS: narrow ST changes: no st elevation Impression: abnormal ekg  ____________________________________________    RADIOLOGY  RUQ [place] Normal   ____________________________________________   PROCEDURES  Procedures  ____________________________________________   INITIAL IMPRESSION / ASSESSMENT AND PLAN / ED COURSE  Pertinent labs & imaging results that were available during my care of the patient were reviewed by me and considered in my medical decision making (see chart for details).   Patient presented to the emergency department because of concern for abdominal pain. On exam had some tenderness on the right side. Patient states she is status post appendectomy. Did obtain RUQ [place] which did not show any concerning findings. No leukocytosis or elevation of liver function tests. Afebrile. At this time do not feel any emergent imaging is necessary. Discussed findings with patient.    ____________________________________________   FINAL CLINICAL IMPRESSION(S) / ED DIAGNOSES  Final diagnoses:  Abdominal pain     Note: This dictation was prepared with Dragon dictation. Any transcriptional errors that result from this process are unintentional     Nance Pear, MD 03/27/19 1041

## 2019-03-27 NOTE — ED Triage Notes (Signed)
Pt to ED via POV c/o RUQ abdominal pain that radiates into her back and right shoulder. Pt states that she thinks that it may be her Gallbladder. Pt states that the pain started yesterday. Pt states that she is having nausea with 1 episode of vomiting last night. Pt is in NAD at this time.

## 2019-03-27 NOTE — ED Notes (Signed)
Pt verbalized understanding of discharge instructions. NAD at this time. 

## 2019-03-27 NOTE — Discharge Instructions (Addendum)
Please seek medical attention for any high fevers, chest pain, shortness of breath, change in behavior, persistent vomiting, bloody stool or any other new or concerning symptoms.  

## 2019-03-29 DIAGNOSIS — R109 Unspecified abdominal pain: Secondary | ICD-10-CM | POA: Insufficient documentation

## 2019-04-05 ENCOUNTER — Other Ambulatory Visit: Payer: Self-pay

## 2019-04-05 ENCOUNTER — Emergency Department
Admission: EM | Admit: 2019-04-05 | Discharge: 2019-04-05 | Disposition: A | Discharge status: Home / Self Care | Payer: Medicaid Other | Attending: Emergency Medicine | Admitting: Emergency Medicine

## 2019-04-05 ENCOUNTER — Encounter: Payer: Self-pay | Admitting: Emergency Medicine

## 2019-04-05 DIAGNOSIS — M5432 Sciatica, left side: Secondary | ICD-10-CM | POA: Diagnosis not present

## 2019-04-05 DIAGNOSIS — J45909 Unspecified asthma, uncomplicated: Secondary | ICD-10-CM | POA: Insufficient documentation

## 2019-04-05 DIAGNOSIS — I509 Heart failure, unspecified: Secondary | ICD-10-CM | POA: Insufficient documentation

## 2019-04-05 DIAGNOSIS — Z79899 Other long term (current) drug therapy: Secondary | ICD-10-CM | POA: Diagnosis not present

## 2019-04-05 DIAGNOSIS — M5431 Sciatica, right side: Secondary | ICD-10-CM | POA: Insufficient documentation

## 2019-04-05 DIAGNOSIS — I2510 Atherosclerotic heart disease of native coronary artery without angina pectoris: Secondary | ICD-10-CM | POA: Insufficient documentation

## 2019-04-05 DIAGNOSIS — I110 Hypertensive heart disease with heart failure: Secondary | ICD-10-CM | POA: Insufficient documentation

## 2019-04-05 DIAGNOSIS — M79674 Pain in right toe(s): Secondary | ICD-10-CM | POA: Diagnosis present

## 2019-04-05 LAB — URINALYSIS, COMPLETE (UACMP) WITH MICROSCOPIC
Bacteria, UA: NONE SEEN
Bilirubin Urine: NEGATIVE
Glucose, UA: NEGATIVE mg/dL
Ketones, ur: NEGATIVE mg/dL
Nitrite: NEGATIVE
Protein, ur: NEGATIVE mg/dL
Specific Gravity, Urine: 1.021 (ref 1.005–1.030)
pH: 7 (ref 5.0–8.0)

## 2019-04-05 LAB — BASIC METABOLIC PANEL
Anion gap: 10 (ref 5–15)
BUN: 21 mg/dL — ABNORMAL HIGH (ref 6–20)
CO2: 26 mmol/L (ref 22–32)
Calcium: 9.7 mg/dL (ref 8.9–10.3)
Chloride: 101 mmol/L (ref 98–111)
Creatinine, Ser: 0.71 mg/dL (ref 0.44–1.00)
GFR calc Af Amer: >60 mL/min (ref >60)
GFR calc non Af Amer: >60 mL/min (ref >60)
Glucose, Bld: 164 mg/dL — ABNORMAL HIGH (ref 70–99)
Potassium: 4.3 mmol/L (ref 3.5–5.1)
Sodium: 137 mmol/L (ref 135–145)

## 2019-04-05 LAB — CBC
HCT: 42.1 % (ref 36.0–46.0)
Hemoglobin: 14 g/dL (ref 12.0–15.0)
MCH: 29.9 pg (ref 26.0–34.0)
MCHC: 33.3 g/dL (ref 30.0–36.0)
MCV: 90 fL (ref 80.0–100.0)
Platelets: 335 10*3/uL (ref 150–400)
RBC: 4.68 MIL/uL (ref 3.87–5.11)
RDW: 13 % (ref 11.5–15.5)
WBC: 5.7 10*3/uL (ref 4.0–10.5)
nRBC: 0 % (ref 0.0–0.2)

## 2019-04-05 MED ORDER — SODIUM CHLORIDE 0.9% FLUSH: 3.0000 mL | Freq: Once | INTRAVENOUS | Status: DC

## 2019-04-05 MED ORDER — DEXAMETHASONE SODIUM PHOSPHATE 10 MG/ML IJ SOLN
10.0000 mg | Freq: Once | INTRAMUSCULAR | Status: AC
  Administered 2019-04-05: 10 mg via INTRAMUSCULAR
  Filled 2019-04-05: qty 1

## 2019-04-05 MED ORDER — OXYCODONE-ACETAMINOPHEN 5-325 MG PO TABS
1.0000 | ORAL_TABLET | Freq: Once | ORAL | Status: AC
  Administered 2019-04-05: 1 via ORAL
  Filled 2019-04-05: qty 1

## 2019-04-05 NOTE — ED Triage Notes (Signed)
Pt presents to ED via POV, states pain started in R great toe and radiated up to her R hip, pt states was told she had a pinched nerve, pt states today pain starts in L great toe and radiates up her L leg and into her back. Pt states she feels like she needs an MRI. Pt also c/o nausea and dizziness.

## 2019-04-05 NOTE — ED Provider Notes (Signed)
[facility] Emergency Department Provider Note   ____________________________________________    I have reviewed the triage vital signs and the nursing notes.   HISTORY  Chief Complaint Leg pain    HPI Claire Mcdonald is a 53 y.o. female who reports that she is having right toe pain that radiates up into her neck occasionally as well as left toe pain that radiates up into her neck.  She states that she was seen at [facility] emergency department last night and had x-rays and referral to spine clinic but notes that their next appointment is not for a month and she does not want to wait.  She is requesting an MRI and pain medication.  Currently not having any back pain.  No weakness numbness  Past Medical History:  Diagnosis Date  . Allergy   . Anxiety   . Asthma 05/06/2016  . Coronary artery disease   . DDD (degenerative disc disease), cervical   . Depression   . Diverticulosis    seen on CT 2015  . DVT (deep venous thrombosis) ([place])   . GERD (gastroesophageal reflux disease)   . Heart failure ([place])   . High cholesterol   . Hyperlipidemia   . Hypertension   . Idiopathic cardiac arrest ([place]) 02/2015   V. fib, EF nl, non-obs CAD; St. Jude ICD  . Migraine   . Obesity (BMI 30-39.9)   . Seizures ([place])   . Ventricular tachycardia [facility])     Patient Active Problem List   Diagnosis Date Noted  . Vitamin D deficiency 12/16/2017  . History of Allergy to iodine 12/16/2017    Class: History of  . History of allergy to radiographic contrast media 12/16/2017    Class: History of  . History of placement of internal cardiac defibrillator 12/16/2017    Class: History of  . Grade 2-3 (13-16 mm) Anterolisthesis of L5 over S1 12/15/2017  . Lumbar foraminal stenosis (L5-S1) (Bilateral) 12/15/2017  . Chronic lumbar radicular pain (S1) (Bilateral) 12/15/2017  . Chronic musculoskeletal pain 12/15/2017  . Neurogenic pain 12/15/2017  . Spinal instability of  lumbosacral region (L5-S1) 12/15/2017  . Lumbosacral radiculopathy at S1 12/15/2017  . History of DVT (deep vein thrombosis) 11/30/2017  . Chronic low back pain (Primary Area of Pain) (Bilateral) (R>L) 11/30/2017  . Chronic lower extremity pain (Secondary Area of Pain) (Bilateral) (R>L) 11/30/2017  . Chronic pain syndrome 11/30/2017  . Long term current use of opiate analgesic 11/30/2017  . Pharmacologic therapy 11/30/2017  . Disorder of skeletal system 11/30/2017  . Problems influencing health status 11/30/2017  . Urinary incontinence 11/16/2017  . Frequent falls 10/09/2017  . Mental disorder follow-up 10/09/2017  . 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 Right hip pain 07/03/2017  . Left-sided weakness 02/20/2017  . Transient neurological symptoms 12/04/2016  . Food insecurity 11/02/2016  . Ischemic stroke ([place]) 10/19/2016  . Diverticulosis of large intestine without hemorrhage 09/19/2016  . Hiatal hernia 09/01/2016  . Migraine headache 09/01/2016  . Asthma 05/06/2016  . Hernia, incisional 05/06/2016  . Bilateral carpal tunnel syndrome 12/28/2015  . Moderate episode of recurrent major depressive disorder ([place]) 12/10/2015  . Atypical squamous cell changes of undetermined significance (ASCUS) on cervical cytology with positive high risk human papilloma virus (HPV) 10/15/2015  . Intramural leiomyoma of uterus 10/14/2015  . Anxiety 10/12/2015  . Gastroesophageal reflux disease without esophagitis 10/12/2015  . Essential hypertension 09/13/2015  . Hyperlipidemia 09/13/2015  . Deep vein  thrombosis (DVT) ([place]) 09/13/2015  . 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 Ventricular tachycardia ([place]) 07/16/2015  . CAD (coronary artery disease) 04/16/2015  . Seizure disorder ([place]) 04/16/2015  . Heart failure ([place]) 04/16/2015  . Normal coronary arteries 04/09/2015  . Obesity (BMI 35.0-39.9 without comorbidity) 04/09/2015  .  Pulmonary nodule seen on imaging study 04/09/2015  . Chronic anticoagulation (Xarelto) 04/09/2015  . Cough   . Dyspnea   . Cardiac arrest; V. fib, St. Jude ICD 03/25/2015  . DDD (degenerative disc disease), lumbar 01/28/2015  . Lumbar facet arthropathy 01/28/2015  . Sacroiliac joint disease 01/28/2015    Past Surgical History:  Procedure Laterality Date  . CARDIAC CATHETERIZATION N/A 03/27/2015   Procedure: Left Heart Cath and Coronary Angiography;  Surgeon: Troy Sine, MD;  LAD 10%/30%, CFX and RCA OK, EF normal  . CENTRAL LINE  03/25/2015      . COLONOSCOPY WITH PROPOFOL N/A 10/01/2015   Procedure: COLONOSCOPY WITH PROPOFOL;  Surgeon: Josefine Class, MD;  Location: [facility] ENDOSCOPY;  Service: Endoscopy;  Laterality: N/A;  . EP IMPLANTABLE DEVICE N/A 04/11/2015   Procedure: ICD Implant;  Surgeon: Deboraha Sprang, MD;  [facility] ICD, serial number [id number]  . EP IMPLANTABLE DEVICE N/A 07/16/2015   Procedure: Lead Revision/Repair;  Surgeon: Will Meredith Leeds, MD;  Location: [place] CV LAB;  Service: Cardiovascular;  Laterality: N/A;  . ESOPHAGOGASTRODUODENOSCOPY (EGD) WITH PROPOFOL N/A 10/01/2015   Procedure: ESOPHAGOGASTRODUODENOSCOPY (EGD) WITH PROPOFOL;  Surgeon: Josefine Class, MD;  Location: [facility] ENDOSCOPY;  Service: Endoscopy;  Laterality: N/A;  . HAND SURGERY Left   . OVARIAN CYST REMOVAL Left   . TUBAL LIGATION  1998    Prior to Admission medications   Medication Sig Start Date End Date Taking? Authorizing Provider  albuterol (PROVENTIL HFA;VENTOLIN HFA) 108 (90 BASE) MCG/ACT inhaler Inhale 2 puffs into the lungs every 6 (six) hours as needed for wheezing or shortness of breath. 01/23/15   Menshew, Dannielle Karvonen, PA-C  amitriptyline (ELAVIL) 25 MG tablet Take 25 mg by mouth at bedtime.    [provider]  atorvastatin (LIPITOR) 20 MG tablet Take 1 tablet (20 mg total) by mouth daily at 6 PM. Patient taking differently: Take 40 mg by mouth daily at 6 PM.   07/06/15   [name], NP  Calcium Carbonate-Vit D-Min (GNP CALCIUM 1200) 1200-1000 MG-UNIT CHEW Chew 1,200 mg by mouth daily with breakfast. Take in combination with vitamin D and magnesium. 12/16/17 03/18/19  Milinda Pointer, MD  citalopram (CELEXA) 40 MG tablet Take 40 mg by mouth daily.    [provider]  diphenhydrAMINE (BENADRYL) 50 MG capsule Take 1 capsule (50 mg total) by mouth PRO for 1 day. Take 50 mg by mouth 1 hour before procedure. Must have a driver. 12/16/17 12/17/17  Milinda Pointer, MD  famotidine (PEPCID) 20 MG tablet Take 1 tablet (20 mg total) by mouth 2 (two) times daily. 03/27/19 03/26/20  Nance Pear, MD  furosemide (LASIX) 20 MG tablet Take 20 mg by mouth daily and may take an additional daily as directed Patient taking differently: 40 mg. Take 20 mg by mouth daily and may take an additional daily as directed 09/21/15   Deboraha Sprang, MD  gabapentin (NEURONTIN) 300 MG capsule Take 300 mg by mouth daily.    [provider]  lisinopril (PRINIVIL,ZESTRIL) 5 MG tablet Take 5 mg by mouth daily.    [provider]  Magnesium 400 MG TABS Take 400  mg by mouth daily.    [provider]  metoprolol tartrate (LOPRESSOR) 25 MG tablet Take 0.5 tablets (12.5 mg total) by mouth 2 (two) times daily. Patient taking differently: Take 25 mg by mouth 2 (two) times daily.  08/03/15   [name], NP  predniSONE (DELTASONE) 50 MG tablet Take 1 tablet (50 mg total) by mouth PRO for 1 day. Take 1 tab PO 13 hrs, 7 hrs, and 1 hr before contrast media injection (procedure). 12/16/17 12/17/17  Milinda Pointer, MD  rivaroxaban (XARELTO) 20 MG TABS tablet Take 20 mg by mouth daily with supper.    [provider]  sucralfate (CARAFATE) 1 g tablet Take 1 tablet (1 g total) by mouth 4 (four) times daily. 03/27/19   Nance Pear, MD  topiramate (TOPAMAX) 100 MG tablet Take 1 tablet (100 mg total) by mouth 2 (two) times daily. 04/12/15   Kinnie Feil, MD     Allergies Iodinated diagnostic agents, Toradol [ketorolac tromethamine], Tramadol, and Zofran [ondansetron hcl]  Family History  Problem Relation Age of Onset  . Diabetes Father   . Asthma Father   . Hyperlipidemia Father   . Hypertension Father   . Kidney disease Father   . Asthma Mother   . Depression Mother   . Hyperlipidemia Mother   . Hypertension Mother   . Cancer Maternal Grandmother   . Early death Maternal Grandmother   . Breast cancer Maternal Grandmother   . Early death Paternal Grandmother   . Allergies Daughter   . Allergies Son   . Allergies Son   . Heart disease Son   . Breast cancer Paternal Aunt   . Stroke Neg Hx     Social History Social History   Tobacco Use  . Smoking status: Never Smoker  . Smokeless tobacco: Never Used  Substance Use Topics  . Alcohol use: No  . Drug use: No    Review of Systems  Constitutional: No fever/chills Eyes: No visual changes.  ENT: No neck pain currently Cardiovascular: Denies chest pain. Respiratory: Denies shortness of breath. Gastrointestinal: No abdominal pain.  No nausea, no vomiting.   Genitourinary: Negative for dysuria. Musculoskeletal: No back pain currently, toe pain as above Skin: Negative for rash. Neurological: No numbness or weakness, ambulating well   ____________________________________________   PHYSICAL EXAM:  VITAL SIGNS: ED Triage Vitals  Enc Vitals Group     BP 04/05/19 0942 113/79     Pulse Rate 04/05/19 0942 70     Resp 04/05/19 0942 18     Temp 04/05/19 0942 98.5 F (36.9 C)     Temp Source 04/05/19 0942 Oral     SpO2 04/05/19 0942 95 %     Weight 04/05/19 0940 68.9 kg (152 lb)     Height 04/05/19 0940 1.575 m (5\' 2" )     Head Circumference --      Peak Flow --      Pain Score 04/05/19 0940 10     Pain Loc --      Pain Edu? --      Excl. in [place]? --     Constitutional: Alert and oriented.   Mouth/Throat: Mucous membranes are moist.   Neck:  Painless ROM,  no vertebral tenderness to palpation Cardiovascular: Normal rate, regular rhythm. Grossly normal heart sounds.  Good peripheral circulation. Respiratory: Normal respiratory effort.  No retractions. Lungs CTAB. Gastrointestinal: Soft and nontender. No distention.  No CVA tenderness. Genitourinary: deferred Musculoskeletal: Normal strength in the  lower extremities and upper extremities.  Normal reflexes.  Normal exam of the feet and toes.  Grossly normal sensation.  No vertebral tenderness of the back Neurologic:  Normal speech and language. No gross focal neurologic deficits are appreciated.  Skin:  Skin is warm, dry and intact. No rash noted. Psychiatric: Mood and affect are normal. Speech and behavior are normal.  ____________________________________________   LABS (all labs ordered are listed, but only abnormal results are displayed)  Labs Reviewed  BASIC METABOLIC PANEL - Abnormal; Notable for the following components:      Result Value   Glucose, Bld 164 (*)    BUN 21 (*)    All other components within normal limits  URINALYSIS, COMPLETE (UACMP) WITH MICROSCOPIC - Abnormal; Notable for the following components:   Color, Urine YELLOW (*)    APPearance CLOUDY (*)    Hgb urine dipstick SMALL (*)    Leukocytes,Ua TRACE (*)    All other components within normal limits  CBC  CBG MONITORING, ED   ____________________________________________  EKG  ED ECG REPORT I, Lavonia Drafts, the attending physician, personally viewed and interpreted this ECG.  Date: 04/05/2019  Rhythm: normal sinus rhythm QRS Axis: normal Intervals: normal ST/T Wave abnormalities: normal Narrative Interpretation: no evidence of acute ischemia  ____________________________________________  RADIOLOGY   ____________________________________________   PROCEDURES  Procedure(s) performed: No  Procedures   Critical Care performed: No ____________________________________________   INITIAL  IMPRESSION / ASSESSMENT AND PLAN / ED COURSE  Pertinent labs & imaging results that were available during my care of the patient were reviewed by me and considered in my medical decision making (see chart for details).  Patient presents with what appears to be sciatica symptoms no neuro deficits necessitating emergent MRI.  Discussed with her that we will treat her with pain medication as well as steroids to see if this helps her symptoms.  We will refer her to our spine surgeons here for further evaluation, she is quite content with this plan.    ____________________________________________   FINAL CLINICAL IMPRESSION(S) / ED DIAGNOSES  Final diagnoses:  Bilateral sciatica        Note:  This document was prepared using Dragon voice recognition software and may include unintentional dictation errors.   Lavonia Drafts, MD 04/05/19 [phone]

## 2019-04-05 NOTE — ED Notes (Signed)
While attempting to discharge patient, patient states "my chest is hurting bad. It's getting tight". Kinner MD notified and at bedside.

## 2019-04-06 DIAGNOSIS — G479 Sleep disorder, unspecified: Secondary | ICD-10-CM | POA: Insufficient documentation

## 2019-05-09 ENCOUNTER — Encounter: Payer: Self-pay | Admitting: Pain Medicine

## 2019-05-10 NOTE — Progress Notes (Signed)
Pain Management Virtual Encounter Note - Virtual Visit via Telephone Telehealth (real-time audio visits between healthcare provider and patient).   Patient's Phone No. & Preferred Pharmacy:  [phone] (home); [phone] (mobile); (Preferred) [phone] No e-mail address on record  [facility], [place] - [place] [address] Phone: [phone] Fax: [phone]  [facility] - [place], [place] - [address] [place]  [address] Phone: [phone] Fax: [phone]    Pre-screening note:  Our staff contacted Claire Mcdonald and offered her an "in person", "face-to-face" appointment versus a telephone encounter. She indicated preferring the telephone encounter, at this time.   Reason for Virtual Visit: COVID-19*  Social distancing based on CDC and AMA recommendations.   I contacted Claire Mcdonald on 05/11/2019 via telephone.      I clearly identified myself as [name], MD. I verified that I was speaking with the correct person using two identifiers (Name: Claire Mcdonald, and date of birth: August 19, 1966).  Advanced Informed Consent I sought verbal advanced consent from Claire Mcdonald for virtual visit interactions. I informed Claire Mcdonald of possible security and privacy concerns, risks, and limitations associated with providing "not-in-person" medical evaluation and management services. I also informed Claire Mcdonald of the availability of "in-person" appointments. Finally, I informed her that there would be a charge for the virtual visit and that she could be  personally, fully or partially, financially responsible for it. Claire Mcdonald expressed understanding and agreed to proceed.   Historic Elements   Claire Mcdonald is a 53 y.o. year old, female patient evaluated today after her last encounter by our practice on Visit date not found. Claire Mcdonald  has a past medical history of Allergy, Anxiety, Asthma (05/06/2016), Coronary  artery disease, DDD (degenerative disc disease), cervical, Depression, Diverticulosis, DVT (deep venous thrombosis) ([place]), GERD (gastroesophageal reflux disease), Heart failure ([place]), High cholesterol, Hyperlipidemia, Hypertension, Idiopathic cardiac arrest ([place]) (02/2015), Migraine, Obesity (BMI 30-39.9), Seizures ([place]), and Ventricular tachycardia ([place]). She also  has a past surgical history that includes Tubal ligation (1998); Ovarian cyst removal (Left); central line (03/25/2015); Hand surgery (Left); Cardiac catheterization (N/A, 03/27/2015); Cardiac catheterization (N/A, 04/11/2015); Cardiac catheterization (N/A, 07/16/2015); Colonoscopy with propofol (N/A, 10/01/2015); and Esophagogastroduodenoscopy (egd) with propofol (N/A, 10/01/2015). Claire Mcdonald has a current medication list which includes the following prescription(s): acetaminophen, albuterol, atorvastatin, gnp calcium 1200, cetirizine, vitamin d3, diazepam, famotidine, fluticasone, fluticasone, furosemide, gabapentin, hydroxyzine, levetiracetam, lisinopril, magnesium, metoprolol tartrate, potassium chloride, promethazine, rivaroxaban, topiramate, and triamcinolone. She  reports that she has never smoked. She has never used smokeless tobacco. She reports that she does not drink alcohol or use drugs. Claire Mcdonald is allergic to tape; iodinated diagnostic agents; toradol [ketorolac tromethamine]; tramadol; and zofran [ondansetron hcl].   HPI  Today, she is being contacted for a post-procedure assessment.  Unfortunately, we were unable to obtain any type of information from this patient after the bilateral L5 transforaminal epidural steroid injection since she did not follow-up with [place] 2 weeks after the procedure as we had requested. The procedure was done on 01/01/2019 and she had an appointment to return for follow-up on 01/13/2018, which was not kept.  The patient indicates that she did get a lot of relief from the injection, but she was not used to having anybody  record all of this information after her procedures.  She used to be a patient of Dr. Mohammed Kindle.  Claire Mcdonald indicates  that the procedure usually provides her with approximately 3 months of significant relief.  She was not able to clearly articulate to me how much benefit in function or range of motion she obtained from the procedure since it was done more than a year ago.  Today I have stressed the fact that she needs to follow-up with [place] 2 weeks after the procedure so that we can record this information and be able to better justify repeating this injections and also I will like to see if there is anything else that we can do for her.  She understood and accepted.  Post-Procedure Evaluation  Procedure (12/31/2017): Diagnostic bilateral L5 TFESI #1 under fluoroscopic guidance and IV sedation Pre-procedure pain level:  0/10 Post-procedure: 0/10          Sedation: Sedation provided.  Effectiveness during initial hour after procedure(Ultra-Short Term Relief): (dont remember) (the patient did not follow-up with [place] 2 weeks after the procedure as requested.)  Local anesthetic used: Long-acting (4-6 hours) Effectiveness: Defined as any analgesic benefit obtained secondary to the administration of local anesthetics. This carries significant diagnostic value as to the etiological location, or anatomical origin, of the pain. Duration of benefit is expected to coincide with the duration of the local anesthetic used.  Effectiveness during initial 4-6 hours after procedure(Short-Term Relief): (dont remember)   Long-term benefit: Defined as any relief past the pharmacologic duration of the local anesthetics.  Effectiveness past the initial 6 hours after procedure(Long-Term Relief):   Unknown  Current benefits: Defined as benefit that persist at this time.   Analgesia:  The patient did not follow-up with [place] 2 weeks after the procedure as requested. Function: Unknown ROM: Unknown  Pharmacotherapy Assessment   Analgesic: No opioid analgesics prescribed by our practice.   Monitoring: Pharmacotherapy: No side-effects or adverse reactions reported. [place] PMP: PDMP reviewed during this encounter.       Compliance: No problems identified. Effectiveness: Clinically acceptable. Plan: Refer to "POC".  UDS: No results found for: SUMMARY Laboratory Chemistry Profile (12 mo)  Renal: 04/05/2019: BUN 21; Creatinine, Ser 0.71  Lab Results  Component Value Date   GFRAA >60 04/05/2019   GFRNONAA >60 04/05/2019   Hepatic: 03/27/2019: Albumin 4.0 Lab Results  Component Value Date   AST 17 03/27/2019   ALT 12 03/27/2019   Other: No results found for requested labs within last 8760 hours. Note: Above Lab results reviewed.  Imaging  Last 90 days:  US Abdomen Limited Ruq  Result Date: 03/27/2019 CLINICAL DATA:  Abdomen pain for 1 day EXAM: ULTRASOUND ABDOMEN LIMITED RIGHT UPPER QUADRANT COMPARISON:  CT abdomen pelvis February 20, 2017 FINDINGS: Gallbladder: No gallstones or wall thickening visualized. No sonographic Murphy sign noted by sonographer. Common bile duct: Diameter: 3.2 mm Liver: No focal lesion identified. Within normal limits in parenchymal echogenicity. Portal vein is patent on color Doppler imaging with normal direction of blood flow towards the liver. Other: None. IMPRESSION: Normal right upper quadrant ultrasound. Electronically Signed   By: Abelardo Diesel M.D.   On: 03/27/2019 10:10    Assessment  The primary encounter diagnosis was Chronic pain syndrome. Diagnoses of Chronic low back pain (Primary Area of Pain) (Bilateral) (R>L), Chronic lower extremity pain (Secondary Area of Pain) (Bilateral) (R>L), DDD (degenerative disc disease), lumbar, Grade 2-3 (13-16 mm) Anterolisthesis of L5 over S1, and Chronic anticoagulation (Xarelto) were also pertinent to this visit.  Plan of Care  I have discontinued Claire Mcdonald. Claire Mcdonald's amitriptyline, citalopram, predniSONE, diphenhydrAMINE, and sucralfate. I  am also  having her maintain her albuterol, topiramate, atorvastatin, metoprolol tartrate, furosemide, gabapentin, lisinopril, Magnesium, rivaroxaban, GNP Calcium 1200, famotidine, acetaminophen, cetirizine, Vitamin D3, diazepam, fluticasone, fluticasone, hydrOXYzine, levETIRAcetam, potassium chloride, promethazine, and triamcinolone.  Pharmacotherapy (Medications Ordered): No orders of the defined types were placed in this encounter.  Orders:  Orders Placed This Encounter  Procedures  . Lumbar Transforaminal Epidural    Standing Status:   Future    Standing Expiration Date:   06/10/2019    Scheduling Instructions:     Side: Bilateral     Level: L5     Sedation: With Sedation.     Timeframe: ASAP    Order Specific Question:   Where will this procedure be performed?    Answer:   [facility]  . Blood Thinner Instructions to Nursing    If the patient requires a Lovenox-bridge therapy, make sure arrangements are made to institute it with the assistance of the PCP.    Standing Status:   Standing    Number of Occurrences:   36    Standing Expiration Date:   11/07/2020    Scheduling Instructions:     Always stop the Xarelto (Rivaroxaban) x 3 days prior to procedure or surgery.   Follow-up plan:   Return for Procedure (w/ sedation): (B) L5 TFESI #2, (Blood-thinner Protocol).      Interventional management options: Planned, scheduled, and/or pending:    Diagnostic bilateral L5 TFESI #2 under fluoro and IV sedation.   Considering:   NOTE: Xarelto Anticoagulation. (Stop: 3 days  Restart: 6 hrs) CONTRAST ALLERGY.  Diagnosticbilateral L5 TFESI #2  (the patient did not follow-up with [place] after the procedure as requested) Diagnosticbilateral lumbar facet block  Possiblebilateral facet RFA    PRN Procedures:   None at this time    Recent Visits No visits were found meeting these conditions.  Showing recent visits within past 90 days and meeting all other requirements   Today's  Visits Date Type Provider Dept  05/11/19 Office Visit Milinda Pointer, MD [facility]  Showing today's visits and meeting all other requirements   Future Appointments No visits were found meeting these conditions.  Showing future appointments within next 90 days and meeting all other requirements   I discussed the assessment and treatment plan with the patient. The patient was provided an opportunity to ask questions and all were answered. The patient agreed with the plan and demonstrated an understanding of the instructions.  Patient advised to call back or seek an in-person evaluation if the symptoms or condition worsens.  Total duration of non-face-to-face encounter: 13 minutes.  Note by: [name], MD Date: 05/11/2019; Time: 9:41 AM  Note: This dictation was prepared with Dragon dictation. Any transcriptional errors that may result from this process are unintentional.  Disclaimer:  * Given the special circumstances of the COVID-19 pandemic, the federal government has announced that the Office for Civil Rights (OCR) will exercise its enforcement discretion and will not impose penalties on physicians using telehealth in the event of noncompliance with regulatory requirements under the [place] (HIPAA) in connection with the good faith provision of telehealth during the XX123456 national public health emergency. ([place])

## 2019-05-11 ENCOUNTER — Other Ambulatory Visit: Payer: Self-pay

## 2019-05-11 ENCOUNTER — Ambulatory Visit: Payer: Medicaid Other | Attending: Pain Medicine | Admitting: Pain Medicine

## 2019-05-11 DIAGNOSIS — G894 Chronic pain syndrome: Secondary | ICD-10-CM | POA: Diagnosis not present

## 2019-05-11 DIAGNOSIS — G2581 Restless legs syndrome: Secondary | ICD-10-CM | POA: Insufficient documentation

## 2019-05-11 DIAGNOSIS — M5136 Other intervertebral disc degeneration, lumbar region: Secondary | ICD-10-CM

## 2019-05-11 DIAGNOSIS — M5442 Lumbago with sciatica, left side: Secondary | ICD-10-CM

## 2019-05-11 DIAGNOSIS — M79604 Pain in right leg: Secondary | ICD-10-CM | POA: Diagnosis not present

## 2019-05-11 DIAGNOSIS — M79605 Pain in left leg: Secondary | ICD-10-CM

## 2019-05-11 DIAGNOSIS — Z7901 Long term (current) use of anticoagulants: Secondary | ICD-10-CM

## 2019-05-11 DIAGNOSIS — G8929 Other chronic pain: Secondary | ICD-10-CM

## 2019-05-11 DIAGNOSIS — M5441 Lumbago with sciatica, right side: Secondary | ICD-10-CM

## 2019-05-11 DIAGNOSIS — M4310 Spondylolisthesis, site unspecified: Secondary | ICD-10-CM

## 2019-05-11 NOTE — Patient Instructions (Signed)
____________________________________________________________________________________________  Preparing for Procedure with Sedation  Procedure appointments are limited to planned procedures: . No Prescription Refills. . No disability issues will be discussed. . No medication changes will be discussed.  Instructions: . Oral Intake: Do not eat or drink anything for at least 8 hours prior to your procedure. . Transportation: Public transportation is not allowed. Bring an adult driver. The driver must be physically present in our waiting room before any procedure can be started. . Physical Assistance: Bring an adult physically capable of assisting you, in the event you need help. This adult should keep you company at home for at least 6 hours after the procedure. . Blood Pressure Medicine: Take your blood pressure medicine with a sip of water the morning of the procedure. . Blood thinners: Notify our staff if you are taking any blood thinners. Depending on which one you take, there will be specific instructions on how and when to stop it. . Diabetics on insulin: Notify the staff so that you can be scheduled 1st case in the morning. If your diabetes requires high dose insulin, take only  of your normal insulin dose the morning of the procedure and notify the staff that you have done so. . Preventing infections: Shower with an antibacterial soap the morning of your procedure. . Build-up your immune system: Take 1000 mg of Vitamin C with every meal (3 times a day) the day prior to your procedure. . Antibiotics: Inform the staff if you have a condition or reason that requires you to take antibiotics before dental procedures. . Pregnancy: If you are pregnant, call and cancel the procedure. . Sickness: If you have a cold, fever, or any active infections, call and cancel the procedure. . Arrival: You must be in the facility at least 30 minutes prior to your scheduled procedure. . Children: Do not bring  children with you. . Dress appropriately: Bring dark clothing that you would not mind if they get stained. . Valuables: Do not bring any jewelry or valuables.  Reasons to call and reschedule or cancel your procedure: (Following these recommendations will minimize the risk of a serious complication.) . Surgeries: Avoid having procedures within 2 weeks of any surgery. (Avoid for 2 weeks before or after any surgery). . Flu Shots: Avoid having procedures within 2 weeks of a flu shots or . (Avoid for 2 weeks before or after immunizations). . Barium: Avoid having a procedure within 7-10 days after having had a radiological study involving the use of radiological contrast. (Myelograms, Barium swallow or enema study). . Heart attacks: Avoid any elective procedures or surgeries for the initial 6 months after a "Myocardial Infarction" (Heart Attack). . Blood thinners: It is imperative that you stop these medications before procedures. Let us know if you if you take any blood thinner.  . Infection: Avoid procedures during or within two weeks of an infection (including chest colds or gastrointestinal problems). Symptoms associated with infections include: Localized redness, fever, chills, night sweats or profuse sweating, burning sensation when voiding, cough, congestion, stuffiness, runny nose, sore throat, diarrhea, nausea, vomiting, cold or Flu symptoms, recent or current infections. It is specially important if the infection is over the area that we intend to treat. . Heart and lung problems: Symptoms that may suggest an active cardiopulmonary problem include: cough, chest pain, breathing difficulties or shortness of breath, dizziness, ankle swelling, uncontrolled high or unusually low blood pressure, and/or palpitations. If you are experiencing any of these symptoms, cancel your procedure and contact   your primary care physician for an evaluation.  Remember:  Regular Business hours are:  Monday to Thursday  8:00 AM to 4:00 PM  Provider's Schedule: Etsuko Dierolf, MD:  Procedure days: Tuesday and Thursday 7:30 AM to 4:00 PM  Bilal Lateef, MD:  Procedure days: Monday and Wednesday 7:30 AM to 4:00 PM ____________________________________________________________________________________________   ____________________________________________________________________________________________  Blood Thinners  Recommended Time Interval Before and After Neuraxial Block or Catheter Removal  Drug (Generic) Brand Name Time Before Time After Comments  Abciximab Reopro 15 days 2 hours   Alteplase Activase 10 days 10 days   Apixaban Eliquis 3 days 6 hours   Aspirin > 325 mg Goody Powders/Excedrin 11 days  (Usually not stopped)  Aspirin ? 81 mg  7 days  (Usually not stopped)  Cholesterol Medication Lipitor 4 days    Cilostazol Pletal 3 days 5 hours   Clopidogrel Plavix 7-10 days 2 hours   Dabigatran Pradaxa 5 days 6 hours   Delteparin Fragmin 24 hours 4 hours   Dipyridamole + ASA Aggrenox 11days 2 hours   Enoxaparin  Lovenox 24 hours 4 hours   Eptifibatide Integrillin 8 hours 2 hours   Fish oil  4 days    Fondaparinux  Arixtra 72 hours 12 hours   Garlic supplements  7 days    Ginkgo biloba  36 hours    Ginseng  24 hours    Heparin (IV)  4 hours 2 hours   Heparin ([place])  12 hours 2 hours   Hydroxychloroquine Plaquenil 11 days    LMW Heparin  24 hours    LMWH  24 hours    NSAIDs  3 days  (Usually not stopped)  Prasugrel Effient 7-10 days 6 hours   Reteplase Retavase 10 days 10 days   Rivaroxaban Xarelto 3 days 6 hours   Streptokinase Streptase 10 days 10 days   Tenecteplase TNKase 10 days 10 days   Thrombolytics  10 days  10 days Avoid x 10 days after inj.  Ticagrelor Brilinta 5-7 days 6 hours   Ticlodipine Ticlid 10-14 days 2 hours   Tinzaparin Innohep 24 hours 4 hours   Tirofiban Aggrastat 8 hours 2 hours   Vitamin E  4 days    Warfarin Coumadin 5 days 2 hours    ____________________________________________________________________________________________   

## 2019-05-26 ENCOUNTER — Other Ambulatory Visit: Payer: Self-pay | Admitting: Student

## 2019-05-26 DIAGNOSIS — G8929 Other chronic pain: Secondary | ICD-10-CM

## 2019-05-26 DIAGNOSIS — M5442 Lumbago with sciatica, left side: Secondary | ICD-10-CM

## 2019-05-30 NOTE — Progress Notes (Signed)
Patient's Name: Claire Mcdonald  MRN: [id number]  Referring Provider: [facility]  DOB: [birth year]  PCP: [facility]  DOS: 05/31/2019  Note by: [name], MD  Service setting: Ambulatory outpatient  Specialty: Interventional Pain Management  Patient type: Established  Location: [facility] (AMB) Pain Management Facility  Visit type: Interventional Procedure   Primary Reason for Visit: Interventional Pain Management Treatment. CC: Back Pain  Procedure:          Anesthesia, Analgesia, Anxiolysis:  Type: Trans-Foraminal Epidural Steroid Injection #2  Purpose: Diagnostic/Therapeutic Region: Posterolateral Lumbosacral Target Area: The 6 o'clock position under the pedicle, on the affected side. Approach: Posterior Percutaneous Paravertebral approach. Level: L5 Level Laterality: Bilateral Paravertebral  Type: Moderate (Conscious) Sedation combined with Local Anesthesia Indication(s): Analgesia and Anxiety Route: Intravenous (IV) IV Access: Secured Sedation: Meaningful verbal contact was maintained at all times during the procedure  Local Anesthetic: Lidocaine 1-2%  Position: Prone   Indications: 1. Chronic lower extremity pain (Secondary Area of Pain) (Bilateral) (R>L)   2. Grade 2-3 (13-16 mm) Anterolisthesis of L5 over S1   3. Lumbar foraminal stenosis (L5-S1) (Bilateral)   4. Lumbosacral radiculopathy at S1   5. DDD (degenerative disc disease), lumbar   6. Spinal instability of lumbosacral region (L5-S1)    Pain Score: Pre-procedure: 8 /10 Post-procedure: 0-No pain/10   Pre-op Assessment:  Claire Mcdonald is a 53 y.o. (year old), female patient, seen today for interventional treatment. She  has a past surgical history that includes Tubal ligation (1998); Ovarian cyst removal (Left); central line (03/25/2015); Hand surgery (Left); Cardiac catheterization (N/A, 03/27/2015); Cardiac catheterization (N/A, 04/11/2015); Cardiac catheterization (N/A, 07/16/2015); Colonoscopy with  propofol (N/A, 10/01/2015); Esophagogastroduodenoscopy (egd) with propofol (N/A, 10/01/2015); and Abdominal hysterectomy. Claire Mcdonald has a current medication list which includes the following prescription(s): acetaminophen, albuterol, atorvastatin, cetirizine, vitamin d3, diazepam, famotidine, fluticasone, fluticasone, furosemide, gabapentin, hydroxyzine, levetiracetam, lisinopril, magnesium, metoprolol tartrate, potassium chloride, promethazine, rivaroxaban, topiramate, triamcinolone, and gnp calcium 1200, and the following Facility-Administered Medications: fentanyl, midazolam, ropivacaine (pf) 2 mg/ml (0.2%), and sodium chloride flush. Her primarily concern today is the Back Pain  Initial Vital Signs:  Pulse/HCG Rate: 62ECG Heart Rate: (!) 52 Temp: 98.1 F (36.7 C) Resp: 18 BP: 139/84 SpO2: 99 %  BMI: Estimated body mass index is 29.08 kg/m as calculated from the following:   Height as of this encounter: 5\' 2"  (1.575 m).   Weight as of this encounter: 159 lb (72.1 kg).  Risk Assessment: Allergies: Reviewed. She is allergic to tape; iodinated diagnostic agents; toradol [ketorolac tromethamine]; tramadol; and zofran [ondansetron hcl].  Allergy Precautions: None required Coagulopathies: Reviewed. None identified.  Blood-thinner therapy: None at this time Active Infection(s): Reviewed. None identified. Claire Mcdonald is afebrile  Site Confirmation: Claire Mcdonald was asked to confirm the procedure and laterality before marking the site Procedure checklist: Completed Consent: Before the procedure and under the influence of no sedative(s), amnesic(s), or anxiolytics, the patient was informed of the treatment options, risks and possible complications. To fulfill our ethical and legal obligations, as recommended by the American Medical Association's Code of Ethics, I have informed the patient of my clinical impression; the nature and purpose of the treatment or procedure; the risks, benefits, and possible  complications of the intervention; the alternatives, including doing nothing; the risk(s) and benefit(s) of the alternative treatment(s) or procedure(s); and the risk(s) and benefit(s) of doing nothing. The patient was provided information about the general risks and possible complications associated with the procedure. These  may include, but are not limited to: failure to achieve desired goals, infection, bleeding, organ or nerve damage, allergic reactions, paralysis, and death. In addition, the patient was informed of those risks and complications associated to Spine-related procedures, such as failure to decrease pain; infection (i.e.: Meningitis, epidural or intraspinal abscess); bleeding (i.e.: epidural hematoma, subarachnoid hemorrhage, or any other type of intraspinal or peri-dural bleeding); organ or nerve damage (i.e.: Any type of peripheral nerve, nerve root, or spinal cord injury) with subsequent damage to sensory, motor, and/or autonomic systems, resulting in permanent pain, numbness, and/or weakness of one or several areas of the body; allergic reactions; (i.e.: anaphylactic reaction); and/or death. Furthermore, the patient was informed of those risks and complications associated with the medications. These include, but are not limited to: allergic reactions (i.e.: anaphylactic or anaphylactoid reaction(s)); adrenal axis suppression; blood sugar elevation that in diabetics may result in ketoacidosis or comma; water retention that in patients with history of congestive heart failure may result in shortness of breath, pulmonary edema, and decompensation with resultant heart failure; weight gain; swelling or edema; medication-induced neural toxicity; particulate matter embolism and blood vessel occlusion with resultant organ, and/or nervous system infarction; and/or aseptic necrosis of one or more joints. Finally, the patient was informed that Medicine is not an exact science; therefore, there is also  the possibility of unforeseen or unpredictable risks and/or possible complications that may result in a catastrophic outcome. The patient indicated having understood very clearly. We have given the patient no guarantees and we have made no promises. Enough time was given to the patient to ask questions, all of which were answered to the patient's satisfaction. Claire Mcdonald has indicated that she wanted to continue with the procedure. Attestation: I, the ordering provider, attest that I have discussed with the patient the benefits, risks, side-effects, alternatives, likelihood of achieving goals, and potential problems during recovery for the procedure that I have provided informed consent. Date  Time: 05/31/2019  9:22 AM  Pre-Procedure Preparation:  Monitoring: As per clinic protocol. Respiration, ETCO2, SpO2, BP, heart rate and rhythm monitor placed and checked for adequate function Safety Precautions: Patient was assessed for positional comfort and pressure points before starting the procedure. Time-out: I initiated and conducted the "Time-out" before starting the procedure, as per protocol. The patient was asked to participate by confirming the accuracy of the "Time Out" information. Verification of the correct person, site, and procedure were performed and confirmed by me, the nursing staff, and the patient. "Time-out" conducted as per Joint Commission's Universal Protocol (UP.01.01.01). Time: 1055  Description of Procedure:          Area Prepped: Entire Posterior Lumbosacral Area Prepping solution: DuraPrep (Iodine Povacrylex [0.7% available iodine] and Isopropyl Alcohol, 74% w/w) Safety Precautions: Aspiration looking for blood return was conducted prior to all injections. At no point did we inject any substances, as a needle was being advanced. No attempts were made at seeking any paresthesias. Safe injection practices and needle disposal techniques used. Medications properly checked for expiration  dates. SDV (single dose vial) medications used. Description of the Procedure: Protocol guidelines were followed. The patient was placed in position over the procedure table. The target area was identified and the area prepped in the usual manner. Skin & deeper tissues infiltrated with local anesthetic. Appropriate amount of time allowed to pass for local anesthetics to take effect. The procedure needles were then advanced to the target area. Proper needle placement secured. Negative aspiration confirmed. Solution injected in intermittent fashion,  asking for systemic symptoms every 0.5cc of injectate. The needles were then removed and the area cleansed, making sure to leave some of the prepping solution back to take advantage of its long term bactericidal properties.  Vitals:   05/31/19 1111 05/31/19 1119 05/31/19 1129 05/31/19 1139  BP: 116/84 127/89 (!) 131/96 (!) 148/80  Pulse: 60     Resp: 12 12 14 13   Temp:  97.7 F (36.5 C)  97.7 F (36.5 C)  TempSrc:  Temporal    SpO2:  99% 99% 99%  Weight:      Height:        Start Time: 1055 hrs. End Time: 1109 hrs.  Materials:  Needle(s) Type: Spinal Needle Gauge: 22G Length: 3.5-in Medication(s): Please see orders for medications and dosing details.  Imaging Guidance (Spinal):          Type of Imaging Technique: Fluoroscopy Guidance (Spinal) Indication(s): Assistance in needle guidance and placement for procedures requiring needle placement in or near specific anatomical locations not easily accessible without such assistance. Exposure Time: Please see nurses notes. Contrast: Before injecting any contrast, we confirmed that the patient did not have an allergy to iodine, shellfish, or radiological contrast. Once satisfactory needle placement was completed at the desired level, radiological contrast was injected. Contrast injected under live fluoroscopy. No contrast complications. See chart for type and volume of contrast used. Fluoroscopic  Guidance: I was personally present during the use of fluoroscopy. "Tunnel Vision Technique" used to obtain the best possible view of the target area. Parallax error corrected before commencing the procedure. "Direction-depth-direction" technique used to introduce the needle under continuous pulsed fluoroscopy. Once target was reached, antero-posterior, oblique, and lateral fluoroscopic projection used confirm needle placement in all planes. Images permanently stored in EMR. Interpretation: I personally interpreted the imaging intraoperatively. Adequate needle placement confirmed in multiple planes. Appropriate spread of contrast into desired area was observed. No evidence of afferent or efferent intravascular uptake. No intrathecal or subarachnoid spread observed. Permanent images saved into the patient's record.  Antibiotic Prophylaxis:   Anti-infectives (From admission, onward)   None     Indication(s): None identified  Post-operative Assessment:  Post-procedure Vital Signs:  Pulse/HCG Rate: 60(!) 54 Temp: 97.7 F (36.5 C) Resp: 13 BP: (!) 148/80 SpO2: 99 %  EBL: None  Complications: No immediate post-treatment complications observed by team, or reported by patient.  Note: The patient tolerated the entire procedure well. A repeat set of vitals were taken after the procedure and the patient was kept under observation following institutional policy, for this type of procedure. Post-procedural neurological assessment was performed, showing return to baseline, prior to discharge. The patient was provided with post-procedure discharge instructions, including a section on how to identify potential problems. Should any problems arise concerning this procedure, the patient was given instructions to immediately contact us, at any time, without hesitation. In any case, we plan to contact the patient by telephone for a follow-up status report regarding this interventional procedure.  Comments:  No  additional relevant information.  Plan of Care  Orders:  Orders Placed This Encounter  Procedures  . Lumbar Transforaminal Epidural    Scheduling Instructions:     Side: Bilateral     Level: L5     Sedation: With Sedation.     Timeframe: Today    Order Specific Question:   Where will this procedure be performed?    Answer:   [facility]  . DG PA[facility] C-ARM 1-60 MIN NO REPORT  Intraoperative interpretation by procedural physician at [place].    Standing Status:   Standing    Number of Occurrences:   1    Order Specific Question:   Reason for exam:    Answer:   Assistance in needle guidance and placement for procedures requiring needle placement in or near specific anatomical locations not easily accessible without such assistance.  . Informed Consent Details: Physician/Practitioner Attestation; Transcribe to consent form and obtain patient signature    Provider Attestation: I, [place] Dossie Arbour, MD, (Pain Management Specialist), the physician/practitioner, attest that I have discussed with the patient the benefits, risks, side effects, alternatives, likelihood of achieving goals and potential problems during recovery for the procedure that I have provided informed consent.    Scheduling Instructions:     Procedure: Diagnostic lumbar transforaminal epidural steroid injection under fluoroscopic guidance. (See notes for level and laterality.)     Indication/Reason: Lumbar radiculopathy/radiculitis associated with lumbar stenosis     Note: Always confirm area and laterality of pain with Claire Mcdonald, before procedure.  . Care order/instruction: Please confirm that the patient has stopped the Xarelto (Rivaroxaban) x 3 days prior to procedure or surgery.    Please confirm that the patient has stopped the Xarelto (Rivaroxaban) x 3 days prior to procedure or surgery.    Standing Status:   Standing    Number of Occurrences:   1  . Provide equipment / supplies at  bedside    Equipment required: Single use, disposable, "Block Tray"    Standing Status:   Standing    Number of Occurrences:   1    Order Specific Question:   Specify    Answer:   Block Tray  . Bleeding precautions    Standing Status:   Standing    Number of Occurrences:   1  . Miscellanous precautions    Standing Status:   Standing    Number of Occurrences:   1   Chronic Opioid Analgesic:  No opioid analgesics prescribed by our practice.   Medications ordered for procedure: Meds ordered this encounter  Medications  . lidocaine (XYLOCAINE) 2 % (with pres) injection 400 mg  . lactated ringers infusion 1,000 mL  . midazolam (VERSED) 5 MG/5ML injection 1-2 mg    Make sure Flumazenil is available in the pyxis when using this medication. If oversedation occurs, administer 0.2 mg IV over 15 sec. If after 45 sec no response, administer 0.2 mg again over 1 min; may repeat at 1 min intervals; not to exceed 4 doses (1 mg)  . fentaNYL (SUBLIMAZE) injection 25-50 mcg    Make sure Narcan is available in the pyxis when using this medication. In the event of respiratory depression (RR< 8/min): Titrate NARCAN (naloxone) in increments of 0.1 to 0.2 mg IV at 2-3 minute intervals, until desired degree of reversal.  . sodium chloride flush (NS) 0.9 % injection 2 mL  . ropivacaine (PF) 2 mg/mL (0.2%) (NAROPIN) injection 2 mL  . dexamethasone (DECADRON) injection 20 mg   Medications administered: We administered lidocaine, lactated ringers, midazolam, fentaNYL, and dexamethasone.  See the medical record for exact dosing, route, and time of administration.  Follow-up plan:   Return in about 2 weeks (around 06/14/2019) for (VV), (PP).      Interventional management options: Planned, scheduled, and/or pending:    Diagnostic bilateral L5 TFESI #2 under fluoro and IV sedation. (Today)   Considering:   NOTE: Xarelto Anticoagulation. (Stop: 3 days  Restart: 6 hrs)  CONTRAST ALLERGY.   Diagnosticbilateral L5 TFESI #3  (HX of not following-up after procedure) Diagnosticbilateral lumbar facet block  Possiblebilateral facet RFA    PRN Procedures:   None at this time    Recent Visits Date Type Provider Dept  05/11/19 Office Visit Milinda Pointer, MD [facility]  Showing recent visits within past 90 days and meeting all other requirements   Today's Visits Date Type Provider Dept  05/31/19 Procedure visit Milinda Pointer, MD [facility]  Showing today's visits and meeting all other requirements   Future Appointments Date Type Provider Dept  06/13/19 Appointment Milinda Pointer, MD [facility]  Showing future appointments within next 90 days and meeting all other requirements   Disposition: Discharge home  Discharge Date & Time: 05/31/2019; 1144 hrs.   Primary Care Physician: [facility] Location: [facility] Note by: [name], MD Date: 05/31/2019; Time: 12:28 PM  Disclaimer:  Medicine is not an Chief Strategy Officer. The only guarantee in medicine is that nothing is guaranteed. It is important to note that the decision to proceed with this intervention was based on the information collected from the patient. The Data and conclusions were drawn from the patient's questionnaire, the interview, and the physical examination. Because the information was provided in large part by the patient, it cannot be guaranteed that it has not been purposely or unconsciously manipulated. Every effort has been made to obtain as much relevant data as possible for this evaluation. It is important to note that the conclusions that lead to this procedure are derived in large part from the available data. Always take into account that the treatment will also be dependent on availability of resources and existing treatment guidelines, considered by other Pain Management Practitioners as being common knowledge and  practice, at the time of the intervention. For Medico-Legal purposes, it is also important to point out that variation in procedural techniques and pharmacological choices are the acceptable norm. The indications, contraindications, technique, and results of the above procedure should only be interpreted and judged by a Board-Certified Interventional Pain Specialist with extensive familiarity and expertise in the same exact procedure and technique.

## 2019-05-31 ENCOUNTER — Ambulatory Visit: Payer: Medicaid Other | Admitting: Pain Medicine

## 2019-05-31 ENCOUNTER — Other Ambulatory Visit: Payer: Self-pay

## 2019-05-31 ENCOUNTER — Encounter: Payer: Self-pay | Admitting: Pain Medicine

## 2019-05-31 ENCOUNTER — Ambulatory Visit
Admission: RE | Admit: 2019-05-31 | Discharge: 2019-05-31 | Disposition: A | Discharge status: Home / Self Care | Payer: Medicaid Other | Source: Ambulatory Visit | Attending: Pain Medicine | Admitting: Pain Medicine

## 2019-05-31 VITALS — BP 148/80 | HR 60 | Temp 97.7°F | Resp 13 | Ht 62.0 in | Wt 159.0 lb

## 2019-05-31 DIAGNOSIS — Z91041 Radiographic dye allergy status: Secondary | ICD-10-CM

## 2019-05-31 DIAGNOSIS — M4310 Spondylolisthesis, site unspecified: Secondary | ICD-10-CM | POA: Insufficient documentation

## 2019-05-31 DIAGNOSIS — M532X7 Spinal instabilities, lumbosacral region: Secondary | ICD-10-CM | POA: Insufficient documentation

## 2019-05-31 DIAGNOSIS — M5417 Radiculopathy, lumbosacral region: Secondary | ICD-10-CM

## 2019-05-31 DIAGNOSIS — Z7901 Long term (current) use of anticoagulants: Secondary | ICD-10-CM | POA: Insufficient documentation

## 2019-05-31 DIAGNOSIS — M48061 Spinal stenosis, lumbar region without neurogenic claudication: Secondary | ICD-10-CM

## 2019-05-31 DIAGNOSIS — M79604 Pain in right leg: Secondary | ICD-10-CM

## 2019-05-31 DIAGNOSIS — G8929 Other chronic pain: Secondary | ICD-10-CM | POA: Insufficient documentation

## 2019-05-31 DIAGNOSIS — M5136 Other intervertebral disc degeneration, lumbar region: Secondary | ICD-10-CM | POA: Insufficient documentation

## 2019-05-31 DIAGNOSIS — M79605 Pain in left leg: Secondary | ICD-10-CM | POA: Diagnosis present

## 2019-05-31 MED ORDER — LIDOCAINE HCL 2 % IJ SOLN
INTRAMUSCULAR | Status: AC
  Filled 2019-05-31: qty 20

## 2019-05-31 MED ORDER — FENTANYL CITRATE (PF) 100 MCG/2ML IJ SOLN
25.0000 ug | INTRAMUSCULAR | Status: DC | PRN
  Administered 2019-05-31: 50 ug via INTRAVENOUS

## 2019-05-31 MED ORDER — LIDOCAINE HCL 2 % IJ SOLN
20.0000 mL | Freq: Once | INTRAMUSCULAR | Status: AC
  Administered 2019-05-31: 400 mg

## 2019-05-31 MED ORDER — DEXAMETHASONE SODIUM PHOSPHATE 10 MG/ML IJ SOLN
INTRAMUSCULAR | Status: AC
  Filled 2019-05-31: qty 1

## 2019-05-31 MED ORDER — FENTANYL CITRATE (PF) 100 MCG/2ML IJ SOLN
INTRAMUSCULAR | Status: AC
  Filled 2019-05-31: qty 2

## 2019-05-31 MED ORDER — MIDAZOLAM HCL 5 MG/5ML IJ SOLN
INTRAMUSCULAR | Status: AC
  Filled 2019-05-31: qty 5

## 2019-05-31 MED ORDER — ROPIVACAINE HCL 2 MG/ML IJ SOLN
INTRAMUSCULAR | Status: AC
  Filled 2019-05-31: qty 10

## 2019-05-31 MED ORDER — SODIUM CHLORIDE (PF) 0.9 % IJ SOLN
INTRAMUSCULAR | Status: AC
  Filled 2019-05-31: qty 10

## 2019-05-31 MED ORDER — MIDAZOLAM HCL 5 MG/5ML IJ SOLN
1.0000 mg | INTRAMUSCULAR | Status: DC | PRN
  Administered 2019-05-31: 2 mg via INTRAVENOUS

## 2019-05-31 MED ORDER — LACTATED RINGERS IV SOLN
1000.0000 mL | Freq: Once | INTRAVENOUS | Status: AC
  Administered 2019-05-31: 1000 mL via INTRAVENOUS

## 2019-05-31 MED ORDER — SODIUM CHLORIDE 0.9% FLUSH: 2.0000 mL | Freq: Once | INTRAVENOUS | Status: DC

## 2019-05-31 MED ORDER — ROPIVACAINE HCL 2 MG/ML IJ SOLN: 2.0000 mL | Freq: Once | INTRAMUSCULAR | Status: DC

## 2019-05-31 MED ORDER — IOHEXOL 180 MG/ML  SOLN
INTRAMUSCULAR | Status: AC
  Filled 2019-05-31: qty 20

## 2019-05-31 MED ORDER — DEXAMETHASONE SODIUM PHOSPHATE 10 MG/ML IJ SOLN
INTRAMUSCULAR | Status: AC
  Filled 2019-05-31: qty 2

## 2019-05-31 MED ORDER — DEXAMETHASONE SODIUM PHOSPHATE 10 MG/ML IJ SOLN
20.0000 mg | Freq: Once | INTRAMUSCULAR | Status: AC
  Administered 2019-05-31: 20 mg

## 2019-05-31 NOTE — Patient Instructions (Signed)

## 2019-06-01 ENCOUNTER — Telehealth: Payer: Self-pay | Admitting: Pain Medicine

## 2019-06-01 ENCOUNTER — Telehealth: Payer: Self-pay | Admitting: *Deleted

## 2019-06-01 NOTE — Telephone Encounter (Signed)
Patient returning Nurse call  Had procedure and is doing well. She will call if anything changes.  Says thank you to everyone that helped her.

## 2019-06-01 NOTE — Telephone Encounter (Signed)
Attempted to call for post procedure follow-up. Message left. 

## 2019-06-02 ENCOUNTER — Ambulatory Visit: Admission: RE | Admit: 2019-06-02 | Payer: Medicaid Other | Source: Ambulatory Visit

## 2019-06-02 ENCOUNTER — Other Ambulatory Visit: Payer: Self-pay

## 2019-06-02 ENCOUNTER — Ambulatory Visit
Admission: RE | Admit: 2019-06-02 | Discharge: 2019-06-02 | Disposition: A | Discharge status: Home / Self Care | Payer: Medicaid Other | Source: Ambulatory Visit | Attending: Student | Admitting: Student

## 2019-06-02 DIAGNOSIS — G8929 Other chronic pain: Secondary | ICD-10-CM | POA: Insufficient documentation

## 2019-06-02 DIAGNOSIS — M5441 Lumbago with sciatica, right side: Secondary | ICD-10-CM | POA: Diagnosis present

## 2019-06-02 DIAGNOSIS — M5442 Lumbago with sciatica, left side: Secondary | ICD-10-CM | POA: Insufficient documentation

## 2019-06-02 LAB — CBC
HCT: 41.1 % (ref 36.0–46.0)
Hemoglobin: 13.3 g/dL (ref 12.0–15.0)
MCH: 29.9 pg (ref 26.0–34.0)
MCHC: 32.4 g/dL (ref 30.0–36.0)
MCV: 92.4 fL (ref 80.0–100.0)
Platelets: 278 10*3/uL (ref 150–400)
RBC: 4.45 MIL/uL (ref 3.87–5.11)
RDW: 13.2 % (ref 11.5–15.5)
WBC: 8.5 10*3/uL (ref 4.0–10.5)
nRBC: 0 % (ref 0.0–0.2)

## 2019-06-02 LAB — PROTIME-INR
INR: 0.9 (ref 0.8–1.2)
Prothrombin Time: 12.3 seconds (ref 11.4–15.2)

## 2019-06-02 LAB — APTT: aPTT: 24 seconds (ref 24–36)

## 2019-06-09 ENCOUNTER — Encounter: Payer: Self-pay | Admitting: Pain Medicine

## 2019-06-12 NOTE — Progress Notes (Signed)
Pain Management Virtual Encounter Note - Virtual Visit via Telephone Telehealth (real-time audio visits between healthcare provider and patient).   Patient's Phone No. & Preferred Pharmacy:  [phone] (home); [phone] (mobile); (Preferred) [phone] No e-mail address on record  [facility], [place] - [place] [address] Phone: [phone] Fax: [phone]  [facility] - [place], [place] - [address] [place] [address] Phone: [phone] Fax: [phone]    Pre-screening note:  Our staff contacted Claire Mcdonald and offered her an "in person", "face-to-face" appointment versus a telephone encounter. She indicated preferring the telephone encounter, at this time.   Reason for Virtual Visit: COVID-19*  Social distancing based on CDC and AMA recommendations.   I contacted Claire Mcdonald on 06/13/2019 via telephone.      I clearly identified myself as [name], MD. I verified that I was speaking with the correct person using two identifiers (Name: Claire Mcdonald, and date of birth: 03-29-1966).  Advanced Informed Consent I sought verbal advanced consent from Claire Mcdonald for virtual visit interactions. I informed Claire Mcdonald of possible security and privacy concerns, risks, and limitations associated with providing "not-in-person" medical evaluation and management services. I also informed Claire Mcdonald of the availability of "in-person" appointments. Finally, I informed her that there would be a charge for the virtual visit and that she could be  personally, fully or partially, financially responsible for it. Claire Mcdonald expressed understanding and agreed to proceed.   Historic Elements   Claire Mcdonald is a 53 y.o. year old, female patient evaluated today after her last encounter by our practice on 06/01/2019. 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 She also  has a past surgical history that includes Tubal ligation (1998); Ovarian cyst removal (Left); central line (03/25/2015); Hand surgery (Left); Cardiac catheterization (N/A, 03/27/2015); Cardiac catheterization (N/A, 04/11/2015); Cardiac catheterization (N/A, 07/16/2015); Colonoscopy with propofol (N/A, 10/01/2015); Esophagogastroduodenoscopy (egd) with propofol (N/A, 10/01/2015); and Abdominal hysterectomy. Claire Mcdonald has a current medication list which includes the following prescription(s): acetaminophen, albuterol, atorvastatin, gnp calcium 1200, cetirizine, vitamin d3, diazepam, famotidine, fluticasone, fluticasone, furosemide, gabapentin, hydroxyzine, levetiracetam, lisinopril, magnesium, metoprolol tartrate, potassium chloride, promethazine, rivaroxaban, topiramate, and triamcinolone. She  reports that she has never smoked. She has never used smokeless tobacco. She reports that she does not drink alcohol or use drugs. Claire Mcdonald is allergic to tape; iodinated diagnostic agents; toradol [ketorolac tromethamine]; tramadol; and zofran [ondansetron hcl].   HPI  Today, she is being contacted for a post-procedure assessment.  The patient indicates having attained 100% relief of the lower extremity pain, at this point the only thing that remains is some low back pain that she is interested in having [place] treat as well.  Previously the patient had shown to have a positive hyperextension and rotation maneuver, bilaterally, for bilateral lumbar facet syndrome.  Flexion-extension x-rays of the lumbar spine have confirmed that the patient has lumbar facet hypertrophy with  degenerative joint disease and BILATERAL L5 spondylolysis with significant spondylolisthesis L5 on S1, grade 2-3, measuring 13 mm in neutral and extension, increasing to 16 mm in flexion.  At this point, I  will be bringing the patient in for a diagnostic bilateral lumbar facet block under fluoroscopy guidance and IV sedation.  Post-Procedure Evaluation  Procedure: Diagnostic/therapeutic bilateral L5 transforaminal ESI #2 under fluoroscopic guidance and IV sedation Pre-procedure pain level:  8/10 Post-procedure: 0/10 (100% relief)  Sedation: Sedation provided.  Effectiveness during initial hour after procedure(Ultra-Short Term Relief): 80 %   Local anesthetic used: Long-acting (4-6 hours) Effectiveness: Defined as any analgesic benefit obtained secondary to the administration of local anesthetics. This carries significant diagnostic value as to the etiological location, or anatomical origin, of the pain. Duration of benefit is expected to coincide with the duration of the local anesthetic used.  Effectiveness during initial 4-6 hours after procedure(Short-Term Relief): 80 %   Long-term benefit: Defined as any relief past the pharmacologic duration of the local anesthetics.  Effectiveness past the initial 6 hours after procedure(Long-Term Relief): 80 %   Current benefits: Defined as benefit that persist at this time.   Analgesia:  >75% relief of the low back pain and 100% relief of the lower extremity pain, which has not returned at this point. Function: Claire Mcdonald reports improvement in function ROM: Claire Mcdonald reports improvement in ROM  Pharmacotherapy Assessment  Analgesic: No opioid analgesics prescribed by our practice.   Monitoring: Pharmacotherapy: No side-effects or adverse reactions reported. [place] PMP: PDMP reviewed during this encounter.       Compliance: No problems identified. Effectiveness: Clinically acceptable. Plan: Refer to "POC".  UDS: No results found for:  SUMMARY Laboratory Chemistry Profile (12 mo)  Renal: 04/05/2019: BUN 21; Creatinine, Ser 0.71  Lab Results  Component Value Date   GFRAA >60 04/05/2019   GFRNONAA >60 04/05/2019   Hepatic: 03/27/2019: Albumin 4.0 Lab Results  Component Value Date   AST 17 03/27/2019   ALT 12 03/27/2019   Other: No results found for requested labs within last 8760 hours. Note: Above Lab results reviewed.  Imaging  Last 90 days:  Dg [facility] C-arm 1-60 Min No Report  Result Date: 05/31/2019 Fluoro was used, but no Radiologist interpretation will be provided. Please refer to "NOTES" tab for provider progress note.  US Abdomen Limited Ruq  Result Date: 03/27/2019 CLINICAL DATA:  Abdomen pain for 1 day EXAM: ULTRASOUND ABDOMEN LIMITED RIGHT UPPER QUADRANT COMPARISON:  CT abdomen pelvis February 20, 2017 FINDINGS: Gallbladder: No gallstones or wall thickening visualized. No sonographic Murphy sign noted by sonographer. Common bile duct: Diameter: 3.2 mm Liver: No focal lesion identified. Within normal limits in parenchymal echogenicity. Portal vein is patent on color Doppler imaging with normal direction of blood flow towards the liver. Other: None. IMPRESSION: Normal right upper quadrant ultrasound. Electronically Signed   By: Abelardo Diesel M.D.   On: 03/27/2019 10:10    Assessment  The primary encounter diagnosis was Chronic low back pain (Primary Area of Pain) (Bilateral) (R>L). Diagnoses of Lumbar facet syndrome (Bilateral) (R>L), Spondylosis without myelopathy or radiculopathy, lumbosacral region, DDD (degenerative disc disease), lumbar, Vitamin D deficiency, and Chronic anticoagulation (Xarelto) were also pertinent to this visit.  Plan of Care  I am having Claire Mcdonald maintain her albuterol, topiramate, atorvastatin, metoprolol tartrate, furosemide, gabapentin, lisinopril, Magnesium, rivaroxaban, famotidine, acetaminophen, cetirizine, Vitamin D3, diazepam, fluticasone, fluticasone, hydrOXYzine,  levETIRAcetam, potassium chloride, promethazine, triamcinolone, and GNP Calcium 1200.  Pharmacotherapy (Medications Ordered): Meds ordered this encounter  Medications  . Calcium Carbonate-Vit D-Min (GNP CALCIUM 1200) 1200-1000 MG-UNIT CHEW    Sig: Chew 1,200 mg by mouth daily with breakfast. Take in combination with  vitamin D and magnesium.    Dispense:  90 tablet    Refill:  3    Do not place medication on "Automatic Refill".  May substitute with similar over-the-counter product.   Orders:  Orders Placed This Encounter  Procedures  . LUMBAR FACET(MEDIAL BRANCH NERVE BLOCK) MBNB    Standing Status:   Future    Standing Expiration Date:   07/14/2019    Scheduling Instructions:     Side: Bilateral     Level: L3-4, L4-5, & L5-S1 Facets (L2, L3, L4, L5, & S1 Medial Branch Nerves)     Sedation: Patient's choice.     Timeframe: ASAA    Order Specific Question:   Where will this procedure be performed?    Answer:   [facility]  . Blood Thinner Instructions to Nursing    If the patient requires a Lovenox-bridge therapy, make sure arrangements are made to institute it with the assistance of the PCP.    Standing Status:   Standing    Number of Occurrences:   36    Standing Expiration Date:   12/11/2020    Scheduling Instructions:     Always stop the Xarelto (Rivaroxaban) x 3 days prior to procedure or surgery.   Follow-up plan:   Return for Procedure (w/ sedation): (B) L-FCT BLK #1, (Blood-thinner Protocol).      Interventional management options: Planned, scheduled, and/or pending:    Diagnostic bilateral L5 TFESI #2 under fluoro and IV sedation. (Today)   Considering:   NOTE: Xarelto Anticoagulation. (Stop: 3 days  Restart: 6 hrs) CONTRAST ALLERGY.  Diagnosticbilateral L5 TFESI #3  (HX of not following-up after procedure) Diagnosticbilateral lumbar facet block  Possiblebilateral facet RFA    PRN Procedures:   None at this time    Recent Visits Date Type Provider  Dept  05/31/19 Procedure visit Milinda Pointer, MD [facility]  05/11/19 Office Visit Milinda Pointer, MD [facility]  Showing recent visits within past 90 days and meeting all other requirements   Today's Visits Date Type Provider Dept  06/13/19 Office Visit Milinda Pointer, MD [facility]  Showing today's visits and meeting all other requirements   Future Appointments No visits were found meeting these conditions.  Showing future appointments within next 90 days and meeting all other requirements   I discussed the assessment and treatment plan with the patient. The patient was provided an opportunity to ask questions and all were answered. The patient agreed with the plan and demonstrated an understanding of the instructions.  Patient advised to call back or seek an in-person evaluation if the symptoms or condition worsens.  Total duration of non-face-to-face encounter: 15 minutes.  Note by: [name], MD Date: 06/13/2019; Time: 12:44 PM  Note: This dictation was prepared with Dragon dictation. Any transcriptional errors that may result from this process are unintentional.  Disclaimer:  * Given the special circumstances of the COVID-19 pandemic, the federal government has announced that the Office for Civil Rights (OCR) will exercise its enforcement discretion and will not impose penalties on physicians using telehealth in the event of noncompliance with regulatory requirements under the [place] (HIPAA) in connection with the good faith provision of telehealth during the XX123456 national public health emergency. ([place])

## 2019-06-13 ENCOUNTER — Other Ambulatory Visit: Payer: Self-pay

## 2019-06-13 ENCOUNTER — Ambulatory Visit: Payer: Medicaid Other | Attending: Pain Medicine | Admitting: Pain Medicine

## 2019-06-13 ENCOUNTER — Ambulatory Visit: Payer: Medicaid Other

## 2019-06-13 DIAGNOSIS — M5136 Other intervertebral disc degeneration, lumbar region: Secondary | ICD-10-CM

## 2019-06-13 DIAGNOSIS — M47817 Spondylosis without myelopathy or radiculopathy, lumbosacral region: Secondary | ICD-10-CM | POA: Insufficient documentation

## 2019-06-13 DIAGNOSIS — M47816 Spondylosis without myelopathy or radiculopathy, lumbar region: Secondary | ICD-10-CM | POA: Insufficient documentation

## 2019-06-13 DIAGNOSIS — M5442 Lumbago with sciatica, left side: Secondary | ICD-10-CM

## 2019-06-13 DIAGNOSIS — M5441 Lumbago with sciatica, right side: Secondary | ICD-10-CM

## 2019-06-13 DIAGNOSIS — G8929 Other chronic pain: Secondary | ICD-10-CM

## 2019-06-13 DIAGNOSIS — Z7901 Long term (current) use of anticoagulants: Secondary | ICD-10-CM

## 2019-06-13 DIAGNOSIS — E559 Vitamin D deficiency, unspecified: Secondary | ICD-10-CM

## 2019-06-13 MED ORDER — GNP CALCIUM 1200 1200-1000 MG-UNIT PO CHEW: 1200.0000 mg | CHEWABLE_TABLET | Freq: Every day | ORAL | 3 refills | Status: AC

## 2019-06-13 NOTE — Patient Instructions (Signed)

## 2019-06-15 ENCOUNTER — Ambulatory Visit: Payer: Medicaid Other

## 2019-06-15 NOTE — Progress Notes (Signed)
Spoke with patient on phone and gave her an arrival time of 0700 on 06/16/19. Patient states she has pre procedure medications and is understands when to start them.  Patient states she has stopped taking Zoloft and Xarelto for several days.

## 2019-06-16 ENCOUNTER — Other Ambulatory Visit: Payer: Self-pay

## 2019-06-16 ENCOUNTER — Ambulatory Visit
Admission: RE | Admit: 2019-06-16 | Discharge: 2019-06-16 | Disposition: A | Discharge status: Home / Self Care | Payer: Medicaid Other | Source: Ambulatory Visit | Attending: Student | Admitting: Student

## 2019-06-16 DIAGNOSIS — M5442 Lumbago with sciatica, left side: Secondary | ICD-10-CM | POA: Diagnosis present

## 2019-06-16 DIAGNOSIS — M4317 Spondylolisthesis, lumbosacral region: Secondary | ICD-10-CM | POA: Insufficient documentation

## 2019-06-16 DIAGNOSIS — G8929 Other chronic pain: Secondary | ICD-10-CM | POA: Diagnosis not present

## 2019-06-16 DIAGNOSIS — M5441 Lumbago with sciatica, right side: Secondary | ICD-10-CM | POA: Diagnosis not present

## 2019-06-16 LAB — APTT: aPTT: 25 seconds (ref 24–36)

## 2019-06-16 LAB — PROTIME-INR
INR: 0.9 (ref 0.8–1.2)
Prothrombin Time: 12.3 seconds (ref 11.4–15.2)

## 2019-06-16 LAB — CBC
HCT: 41.8 % (ref 36.0–46.0)
Hemoglobin: 13.5 g/dL (ref 12.0–15.0)
MCH: 29.6 pg (ref 26.0–34.0)
MCHC: 32.3 g/dL (ref 30.0–36.0)
MCV: 91.7 fL (ref 80.0–100.0)
Platelets: 317 10*3/uL (ref 150–400)
RBC: 4.56 MIL/uL (ref 3.87–5.11)
RDW: 13 % (ref 11.5–15.5)
WBC: 6.9 10*3/uL (ref 4.0–10.5)
nRBC: 0 % (ref 0.0–0.2)

## 2019-06-16 MED ORDER — IOHEXOL 180 MG/ML  SOLN
20.0000 mL | Freq: Once | INTRAMUSCULAR | Status: AC | PRN
  Administered 2019-06-16: 20 mL via INTRATHECAL

## 2019-06-16 MED ORDER — DIPHENHYDRAMINE HCL 50 MG PO CAPS
50.0000 mg | ORAL_CAPSULE | Freq: Once | ORAL | Status: AC
  Administered 2019-06-16: 50 mg via ORAL
  Filled 2019-06-16: qty 1

## 2019-06-16 NOTE — Progress Notes (Signed)
Pt stable.Vss.Back stable.D/c instructions given.F/u with her MD. 

## 2019-06-22 NOTE — Progress Notes (Signed)
Patient's Name: Claire Mcdonald  MRN: [id number]  Referring Provider: [facility]  DOB: Dec 06, 1965  PCP: [facility]  DOS: 06/23/2019  Note by: [name], MD  Service setting: Ambulatory outpatient  Specialty: Interventional Pain Management  Patient type: Established  Location: [facility] (AMB) Pain Management Facility  Visit type: Interventional Procedure   Primary Reason for Visit: Interventional Pain Management Treatment. CC: Back Pain (lower bilateral )  Procedure:          Anesthesia, Analgesia, Anxiolysis:  Type: Lumbar Facet, Medial Branch Block(s) #1  Primary Purpose: Diagnostic Region: Posterolateral Lumbosacral Spine Level: L2, L3, L4, L5, & S1 Medial Branch Level(s). Injecting these levels blocks the L3-4, L4-5, and L5-S1 lumbar facet joints. Laterality: Bilateral  Type: Moderate (Conscious) Sedation combined with Local Anesthesia Indication(s): Analgesia and Anxiety Route: Intravenous (IV) IV Access: Secured Sedation: Meaningful verbal contact was maintained at all times during the procedure  Local Anesthetic: Lidocaine 1-2%  Position: Prone   Indications: 1. Lumbar facet syndrome (Bilateral) (R>L)   2. Spondylosis without myelopathy or radiculopathy, lumbosacral region   3. Lumbar facet arthropathy   4. Chronic low back pain (Primary Area of Pain) (Bilateral) (R>L)   5. Chronic anticoagulation (Xarelto)    Pain Score: Pre-procedure: 8 /10 Post-procedure: 0-No pain/10   Pre-op Assessment:  Claire Mcdonald is a 53 y.o. (year old), female patient, seen today for interventional treatment. She  has a past surgical history that includes Tubal ligation (1998); Ovarian cyst removal (Left); central line (03/25/2015); Hand surgery (Left); Cardiac catheterization (N/A, 03/27/2015); Cardiac catheterization (N/A, 04/11/2015); Cardiac catheterization (N/A, 07/16/2015); Colonoscopy with propofol (N/A, 10/01/2015); Esophagogastroduodenoscopy (egd) with propofol (N/A,  10/01/2015); and Abdominal hysterectomy. Claire Mcdonald has a current medication list which includes the following prescription(s): acetaminophen, albuterol, atorvastatin, gnp calcium 1200, cetirizine, vitamin d3, diazepam, famotidine, fluticasone, fluticasone, furosemide, gabapentin, hydroxyzine, levetiracetam, lisinopril, magnesium, metoprolol tartrate, potassium chloride, promethazine, rivaroxaban, topiramate, and triamcinolone, and the following Facility-Administered Medications: fentanyl and midazolam. Her primarily concern today is the Back Pain (lower bilateral )  Initial Vital Signs:  Pulse/HCG Rate: (!) 59ECG Heart Rate: 60 Temp: 98.6 F (37 C) Resp: 10 BP: 108/87 SpO2: 99 %  BMI: Estimated body mass index is 29.26 kg/m as calculated from the following:   Height as of this encounter: 5\' 2"  (1.575 m).   Weight as of this encounter: 160 lb (72.6 kg).  Risk Assessment: Allergies: Reviewed. She is allergic to tape; iodinated diagnostic agents; toradol [ketorolac tromethamine]; tramadol; and zofran [ondansetron hcl].  Allergy Precautions: None required Coagulopathies: Reviewed. None identified.  Blood-thinner therapy: None at this time Active Infection(s): Reviewed. None identified. Claire Mcdonald is afebrile  Site Confirmation: Claire Mcdonald was asked to confirm the procedure and laterality before marking the site Procedure checklist: Completed Consent: Before the procedure and under the influence of no sedative(s), amnesic(s), or anxiolytics, the patient was informed of the treatment options, risks and possible complications. To fulfill our ethical and legal obligations, as recommended by the American Medical Association's Code of Ethics, I have informed the patient of my clinical impression; the nature and purpose of the treatment or procedure; the risks, benefits, and possible complications of the intervention; the alternatives, including doing nothing; the risk(s) and benefit(s) of the alternative  treatment(s) or procedure(s); and the risk(s) and benefit(s) of doing nothing. The patient was provided information about the general risks and possible complications associated with the procedure. These may include, but are not limited to: failure to achieve desired goals, infection,  bleeding, organ or nerve damage, allergic reactions, paralysis, and death. In addition, the patient was informed of those risks and complications associated to Spine-related procedures, such as failure to decrease pain; infection (i.e.: Meningitis, epidural or intraspinal abscess); bleeding (i.e.: epidural hematoma, subarachnoid hemorrhage, or any other type of intraspinal or peri-dural bleeding); organ or nerve damage (i.e.: Any type of peripheral nerve, nerve root, or spinal cord injury) with subsequent damage to sensory, motor, and/or autonomic systems, resulting in permanent pain, numbness, and/or weakness of one or several areas of the body; allergic reactions; (i.e.: anaphylactic reaction); and/or death. Furthermore, the patient was informed of those risks and complications associated with the medications. These include, but are not limited to: allergic reactions (i.e.: anaphylactic or anaphylactoid reaction(s)); adrenal axis suppression; blood sugar elevation that in diabetics may result in ketoacidosis or comma; water retention that in patients with history of congestive heart failure may result in shortness of breath, pulmonary edema, and decompensation with resultant heart failure; weight gain; swelling or edema; medication-induced neural toxicity; particulate matter embolism and blood vessel occlusion with resultant organ, and/or nervous system infarction; and/or aseptic necrosis of one or more joints. Finally, the patient was informed that Medicine is not an exact science; therefore, there is also the possibility of unforeseen or unpredictable risks and/or possible complications that may result in a catastrophic  outcome. The patient indicated having understood very clearly. We have given the patient no guarantees and we have made no promises. Enough time was given to the patient to ask questions, all of which were answered to the patient's satisfaction. Ms. Stuntz has indicated that she wanted to continue with the procedure. Attestation: I, the ordering provider, attest that I have discussed with the patient the benefits, risks, side-effects, alternatives, likelihood of achieving goals, and potential problems during recovery for the procedure that I have provided informed consent. Date   Time: 06/23/2019  8:03 AM  Pre-Procedure Preparation:  Monitoring: As per clinic protocol. Respiration, ETCO2, SpO2, BP, heart rate and rhythm monitor placed and checked for adequate function Safety Precautions: Patient was assessed for positional comfort and pressure points before starting the procedure. Time-out: I initiated and conducted the "Time-out" before starting the procedure, as per protocol. The patient was asked to participate by confirming the accuracy of the "Time Out" information. Verification of the correct person, site, and procedure were performed and confirmed by me, the nursing staff, and the patient. "Time-out" conducted as per Joint Commission's Universal Protocol (UP.01.01.01). Time: [id number]  Description of Procedure:          Laterality: Bilateral. The procedure was performed in identical fashion on both sides. Levels:  L2, L3, L4, L5, & S1 Medial Branch Level(s) Area Prepped: Posterior Lumbosacral Region Prepping solution: DuraPrep (Iodine Povacrylex [0.7% available iodine] and Isopropyl Alcohol, 74% w/w) Safety Precautions: Aspiration looking for blood return was conducted prior to all injections. At no point did we inject any substances, as a needle was being advanced. Before injecting, the patient was told to immediately notify me if she was experiencing any new onset of "ringing in the ears, or  metallic taste in the mouth". No attempts were made at seeking any paresthesias. Safe injection practices and needle disposal techniques used. Medications properly checked for expiration dates. SDV (single dose vial) medications used. After the completion of the procedure, all disposable equipment used was discarded in the proper designated medical waste containers. Local Anesthesia: Protocol guidelines were followed. The patient was positioned over the fluoroscopy table. The area was  prepped in the usual manner. The time-out was completed. The target area was identified using fluoroscopy. A 12-in long, straight, sterile hemostat was used with fluoroscopic guidance to locate the targets for each level blocked. Once located, the skin was marked with an approved surgical skin marker. Once all sites were marked, the skin (epidermis, dermis, and hypodermis), as well as deeper tissues (fat, connective tissue and muscle) were infiltrated with a small amount of a short-acting local anesthetic, loaded on a 10cc syringe with a 25G, 1.5-in  Needle. An appropriate amount of time was allowed for local anesthetics to take effect before proceeding to the next step. Local Anesthetic: Lidocaine 2.0% The unused portion of the local anesthetic was discarded in the proper designated containers. Technical explanation of process:  L2 Medial Branch Nerve Block (MBB): The target area for the L2 medial branch is at the junction of the postero-lateral aspect of the superior articular process and the superior, posterior, and medial edge of the transverse process of L3. Under fluoroscopic guidance, a Quincke needle was inserted until contact was made with os over the superior postero-lateral aspect of the pedicular shadow (target area). After negative aspiration for blood, 0.5 mL of the nerve block solution was injected without difficulty or complication. The needle was removed intact. L3 Medial Branch Nerve Block (MBB): The target area  for the L3 medial branch is at the junction of the postero-lateral aspect of the superior articular process and the superior, posterior, and medial edge of the transverse process of L4. Under fluoroscopic guidance, a Quincke needle was inserted until contact was made with os over the superior postero-lateral aspect of the pedicular shadow (target area). After negative aspiration for blood, 0.5 mL of the nerve block solution was injected without difficulty or complication. The needle was removed intact. L4 Medial Branch Nerve Block (MBB): The target area for the L4 medial branch is at the junction of the postero-lateral aspect of the superior articular process and the superior, posterior, and medial edge of the transverse process of L5. Under fluoroscopic guidance, a Quincke needle was inserted until contact was made with os over the superior postero-lateral aspect of the pedicular shadow (target area). After negative aspiration for blood, 0.5 mL of the nerve block solution was injected without difficulty or complication. The needle was removed intact. L5 Medial Branch Nerve Block (MBB): The target area for the L5 medial branch is at the junction of the postero-lateral aspect of the superior articular process and the superior, posterior, and medial edge of the sacral ala. Under fluoroscopic guidance, a Quincke needle was inserted until contact was made with os over the superior postero-lateral aspect of the pedicular shadow (target area). After negative aspiration for blood, 0.5 mL of the nerve block solution was injected without difficulty or complication. The needle was removed intact. S1 Medial Branch Nerve Block (MBB): The target area for the S1 medial branch is at the posterior and inferior 6 o'clock position of the L5-S1 facet joint. Under fluoroscopic guidance, the Quincke needle inserted for the L5 MBB was redirected until contact was made with os over the inferior and postero aspect of the sacrum, at the  6 o' clock position under the L5-S1 facet joint (Target area). After negative aspiration for blood, 0.5 mL of the nerve block solution was injected without difficulty or complication. The needle was removed intact.  Nerve block solution: 0.2% PF-Ropivacaine + Triamcinolone (40 mg/mL) diluted to a final concentration of 4 mg of Triamcinolone/mL of Ropivacaine The unused  portion of the solution was discarded in the proper designated containers. Procedural Needles: 22-gauge, 3.5-inch, Quincke needles used for all levels.  Once the entire procedure was completed, the treated area was cleaned, making sure to leave some of the prepping solution back to take advantage of its long term bactericidal properties.   Illustration of the posterior view of the lumbar spine and the posterior neural structures. Laminae of L2 through S1 are labeled. DPRL5, dorsal primary ramus of L5; DPRS1, dorsal primary ramus of S1; DPR3, dorsal primary ramus of L3; FJ, facet (zygapophyseal) joint L3-L4; I, inferior articular process of L4; LB1, lateral branch of dorsal primary ramus of L1; IAB, inferior articular branches from L3 medial branch (supplies L4-L5 facet joint); IBP, intermediate branch plexus; MB3, medial branch of dorsal primary ramus of L3; NR3, third lumbar nerve root; S, superior articular process of L5; SAB, superior articular branches from L4 (supplies L4-5 facet joint also); TP3, transverse process of L3.  Vitals:   06/23/19 0859 06/23/19 0906 06/23/19 0917 06/23/19 0927  BP: 120/85 120/62 (!) 141/94 118/84  Pulse:      Resp: 16 16 11 13   Temp:  98.2 F (36.8 C)  98.2 F (36.8 C)  TempSrc:  Temporal  Temporal  SpO2: 96% 98% 100% 100%  Weight:      Height:         Start Time: 0851 hrs. End Time: 0859 hrs.  Imaging Guidance (Spinal):          Type of Imaging Technique: Fluoroscopy Guidance (Spinal) Indication(s): Assistance in needle guidance and placement for procedures requiring needle placement in  or near specific anatomical locations not easily accessible without such assistance. Exposure Time: Please see nurses notes. Contrast: None used. Fluoroscopic Guidance: I was personally present during the use of fluoroscopy. "Tunnel Vision Technique" used to obtain the best possible view of the target area. Parallax error corrected before commencing the procedure. "Direction-depth-direction" technique used to introduce the needle under continuous pulsed fluoroscopy. Once target was reached, antero-posterior, oblique, and lateral fluoroscopic projection used confirm needle placement in all planes. Images permanently stored in EMR. Interpretation: No contrast injected. I personally interpreted the imaging intraoperatively. Adequate needle placement confirmed in multiple planes. Permanent images saved into the patient's record.  Antibiotic Prophylaxis:   Anti-infectives (From admission, onward)   None     Indication(s): None identified  Post-operative Assessment:  Post-procedure Vital Signs:  Pulse/HCG Rate: (!) 59(!) 55 Temp: 98.2 F (36.8 C) Resp: 13 BP: 118/84 SpO2: 100 %  EBL: None  Complications: No immediate post-treatment complications observed by team, or reported by patient.  Note: The patient tolerated the entire procedure well. A repeat set of vitals were taken after the procedure and the patient was kept under observation following institutional policy, for this type of procedure. Post-procedural neurological assessment was performed, showing return to baseline, prior to discharge. The patient was provided with post-procedure discharge instructions, including a section on how to identify potential problems. Should any problems arise concerning this procedure, the patient was given instructions to immediately contact us, at any time, without hesitation. In any case, we plan to contact the patient by telephone for a follow-up status report regarding this interventional  procedure.  Comments:  No additional relevant information.  Plan of Care  Orders:  Orders Placed This Encounter  Procedures   LUMBAR FACET(MEDIAL BRANCH NERVE BLOCK) MBNB    Scheduling Instructions:     Procedure: Lumbar facet block (AKA.: Lumbosacral medial branch nerve block)  Side: Bilateral     Level: L3-4, L4-5, & L5-S1 Facets (L2, L3, L4, L5, & S1 Medial Branch Nerves)     Sedation: Patient's choice.     Timeframe: Today    Order Specific Question:   Where will this procedure be performed?    Answer:   [facility]   DG PAIN [facility] C-ARM 1-60 MIN NO REPORT    Intraoperative interpretation by procedural physician at [place].    Standing Status:   Standing    Number of Occurrences:   1    Order Specific Question:   Reason for exam:    Answer:   Assistance in needle guidance and placement for procedures requiring needle placement in or near specific anatomical locations not easily accessible without such assistance.   Informed Consent Details: Physician/Practitioner Attestation; Transcribe to consent form and obtain patient signature    Nursing Order: Transcribe to consent form and obtain patient signature. Note: Always confirm laterality of pain with Ms. Salvucci, before procedure. Procedure: Lumbar Facet Block  under fluoroscopic guidance Indication/Reason: Low Back Pain, with our without leg pain, due to Facet Joint Arthralgia (Joint Pain) known as Lumbar Facet Syndrome, secondary to Lumbar, and/or Lumbosacral Spondylosis (Arthritis of the Spine), without myelopathy or radiculopathy (Nerve Damage). Provider Attestation: I, [place] Dossie Arbour, MD, (Pain Management Specialist), the physician/practitioner, attest that I have discussed with the patient the benefits, risks, side effects, alternatives, likelihood of achieving goals and potential problems during recovery for the procedure that I have provided informed consent.   Care order/instruction: Please  confirm that the patient has stopped the Xarelto (Rivaroxaban) x 3 days prior to procedure or surgery.    Please confirm that the patient has stopped the Xarelto (Rivaroxaban) x 3 days prior to procedure or surgery.    Standing Status:   Standing    Number of Occurrences:   1   Provide equipment / supplies at bedside    Equipment required: Single use, disposable, "Block Tray"    Standing Status:   Standing    Number of Occurrences:   1    Order Specific Question:   Specify    Answer:   Block Tray   Bleeding precautions    Standing Status:   Standing    Number of Occurrences:   1   Chronic Opioid Analgesic:  No opioid analgesics prescribed by our practice.   Medications ordered for procedure: Meds ordered this encounter  Medications   lidocaine (XYLOCAINE) 2 % (with pres) injection 400 mg   lactated ringers infusion 1,000 mL   midazolam (VERSED) 5 MG/5ML injection 1-2 mg    Make sure Flumazenil is available in the pyxis when using this medication. If oversedation occurs, administer 0.2 mg IV over 15 sec. If after 45 sec no response, administer 0.2 mg again over 1 min; may repeat at 1 min intervals; not to exceed 4 doses (1 mg)   fentaNYL (SUBLIMAZE) injection 25-50 mcg    Make sure Narcan is available in the pyxis when using this medication. In the event of respiratory depression (RR< 8/min): Titrate NARCAN (naloxone) in increments of 0.1 to 0.2 mg IV at 2-3 minute intervals, until desired degree of reversal.   ropivacaine (PF) 2 mg/mL (0.2%) (NAROPIN) injection 18 mL   triamcinolone acetonide (KENALOG-40) injection 80 mg   Medications administered: We administered lidocaine, lactated ringers, midazolam, fentaNYL, ropivacaine (PF) 2 mg/mL (0.2%), and triamcinolone acetonide.  See the medical record for exact dosing, route, and time  of administration.  Follow-up plan:   Return in about 2 weeks (around 07/07/2019) for (VV), (PP).       Interventional management  options: Planned, scheduled, and/or pending:    Diagnostic bilateral L5 TFESI #2 under fluoro and IV sedation. (Today)   Considering:   NOTE: Xarelto Anticoagulation. (Stop: 3 days   Restart: 6 hrs) CONTRAST ALLERGY.  Diagnosticbilateral L5 TFESI #3  (HX of not following-up after procedure) Diagnosticbilateral lumbar facet block  Possiblebilateral facet RFA    PRN Procedures:   None at this time    Recent Visits Date Type Provider Dept  06/13/19 Office Visit Milinda Pointer, MD [facility]  05/31/19 Procedure visit Milinda Pointer, MD [facility]  05/11/19 Office Visit Milinda Pointer, MD [facility]  Showing recent visits within past 90 days and meeting all other requirements   Today's Visits Date Type Provider Dept  06/23/19 Procedure visit Milinda Pointer, MD [facility]  Showing today's visits and meeting all other requirements   Future Appointments Date Type Provider Dept  07/11/19 Appointment Milinda Pointer, MD [facility]  Showing future appointments within next 90 days and meeting all other requirements   Disposition: Discharge home  Discharge Date & Time: 06/23/2019; 0928 hrs.   Primary Care Physician: [facility] Location: [facility] Note by: [name], MD Date: 06/23/2019; Time: 10:30 AM  Disclaimer:  Medicine is not an exact science. The only guarantee in medicine is that nothing is guaranteed. It is important to note that the decision to proceed with this intervention was based on the information collected from the patient. The Data and conclusions were drawn from the patient's questionnaire, the interview, and the physical examination. Because the information was provided in large part by the patient, it cannot be guaranteed that it has not been purposely or unconsciously manipulated. Every effort has been made to obtain as much relevant data as  possible for this evaluation. It is important to note that the conclusions that lead to this procedure are derived in large part from the available data. Always take into account that the treatment will also be dependent on availability of resources and existing treatment guidelines, considered by other Pain Management Practitioners as being common knowledge and practice, at the time of the intervention. For Medico-Legal purposes, it is also important to point out that variation in procedural techniques and pharmacological choices are the acceptable norm. The indications, contraindications, technique, and results of the above procedure should only be interpreted and judged by a Board-Certified Interventional Pain Specialist with extensive familiarity and expertise in the same exact procedure and technique.

## 2019-06-22 NOTE — Patient Instructions (Signed)

## 2019-06-23 ENCOUNTER — Ambulatory Visit: Payer: Medicaid Other | Admitting: Pain Medicine

## 2019-06-23 ENCOUNTER — Other Ambulatory Visit: Payer: Self-pay

## 2019-06-23 ENCOUNTER — Encounter: Payer: Self-pay | Admitting: Pain Medicine

## 2019-06-23 ENCOUNTER — Ambulatory Visit
Admission: RE | Admit: 2019-06-23 | Discharge: 2019-06-23 | Disposition: A | Discharge status: Home / Self Care | Payer: Medicaid Other | Source: Ambulatory Visit | Attending: Pain Medicine | Admitting: Pain Medicine

## 2019-06-23 VITALS — BP 118/84 | HR 59 | Temp 98.2°F | Resp 13 | Ht 62.0 in | Wt 160.0 lb

## 2019-06-23 DIAGNOSIS — M5441 Lumbago with sciatica, right side: Secondary | ICD-10-CM | POA: Insufficient documentation

## 2019-06-23 DIAGNOSIS — Z7901 Long term (current) use of anticoagulants: Secondary | ICD-10-CM | POA: Insufficient documentation

## 2019-06-23 DIAGNOSIS — G8929 Other chronic pain: Secondary | ICD-10-CM

## 2019-06-23 DIAGNOSIS — M47817 Spondylosis without myelopathy or radiculopathy, lumbosacral region: Secondary | ICD-10-CM

## 2019-06-23 DIAGNOSIS — M47816 Spondylosis without myelopathy or radiculopathy, lumbar region: Secondary | ICD-10-CM | POA: Diagnosis present

## 2019-06-23 DIAGNOSIS — M5442 Lumbago with sciatica, left side: Secondary | ICD-10-CM | POA: Insufficient documentation

## 2019-06-23 MED ORDER — MIDAZOLAM HCL 5 MG/5ML IJ SOLN
1.0000 mg | INTRAMUSCULAR | Status: DC | PRN
  Administered 2019-06-23: 1 mg via INTRAVENOUS
  Filled 2019-06-23: qty 5

## 2019-06-23 MED ORDER — TRIAMCINOLONE ACETONIDE 40 MG/ML IJ SUSP
80.0000 mg | Freq: Once | INTRAMUSCULAR | Status: AC
  Administered 2019-06-23: 40 mg
  Filled 2019-06-23: qty 2

## 2019-06-23 MED ORDER — LACTATED RINGERS IV SOLN
1000.0000 mL | Freq: Once | INTRAVENOUS | Status: AC
  Administered 2019-06-23: 1000 mL via INTRAVENOUS

## 2019-06-23 MED ORDER — ROPIVACAINE HCL 2 MG/ML IJ SOLN
18.0000 mL | Freq: Once | INTRAMUSCULAR | Status: AC
  Administered 2019-06-23: 10 mL via PERINEURAL
  Filled 2019-06-23: qty 20

## 2019-06-23 MED ORDER — FENTANYL CITRATE (PF) 100 MCG/2ML IJ SOLN
25.0000 ug | INTRAMUSCULAR | Status: DC | PRN
  Administered 2019-06-23: 50 ug via INTRAVENOUS
  Filled 2019-06-23: qty 2

## 2019-06-23 MED ORDER — LIDOCAINE HCL 2 % IJ SOLN
20.0000 mL | Freq: Once | INTRAMUSCULAR | Status: AC
  Administered 2019-06-23: 400 mg
  Filled 2019-06-23: qty 20

## 2019-06-24 ENCOUNTER — Telehealth: Payer: Self-pay | Admitting: *Deleted

## 2019-06-24 NOTE — Telephone Encounter (Signed)
No problems post procedure. 

## 2019-07-05 DIAGNOSIS — R748 Abnormal levels of other serum enzymes: Secondary | ICD-10-CM | POA: Insufficient documentation

## 2019-07-05 DIAGNOSIS — W19XXXA Unspecified fall, initial encounter: Secondary | ICD-10-CM | POA: Insufficient documentation

## 2019-07-05 DIAGNOSIS — G4489 Other headache syndrome: Secondary | ICD-10-CM | POA: Insufficient documentation

## 2019-07-05 DIAGNOSIS — R399 Unspecified symptoms and signs involving the genitourinary system: Secondary | ICD-10-CM | POA: Insufficient documentation

## 2019-07-07 ENCOUNTER — Encounter: Payer: Self-pay | Admitting: Pain Medicine

## 2019-07-07 NOTE — Progress Notes (Signed)
Pain relief after procedure (treated area only): (Questions asked to patient) 1. Starting about 15 minutes after the procedure, and "while the area was still numb" (from the local anesthetics), were you having any of your usual pain "in that area"? (NOT including the discomfort from the needle sticks.) First 1 hour: 100% better. First 4-6 hours: 100 % better. 2. Assuming that it did get numb. How long was the area numb? 100 % benefit, longer than 6 hours. How long? 24hrs 3. How much better is your pain now, when compared to before the procedure? Current benefit: 80 % better. 4. Can you move better now? Improvement in ROM (Range of Motion): Yes. 5. Can you do more now? Improvement in function: Yes. 4. Did you have any problems with the procedure? Side-effects/Complications: No.

## 2019-07-10 NOTE — Progress Notes (Deleted)
Unsuccessful attempt to contact patient for Virtual Visit (Pain Management Telehealth)   Patient provided contact information:  [phone] (home); [phone] (mobile); (Preferred) [phone] No e-mail address on record   Pre-screening:  Our staff was successful in contacting [name] using the above provided information.   I unsuccessfully attempted to make contact with Claire Mcdonald on 07/11/2019 via telephone. I was unable to complete the virtual encounter due to call going directly to voicemail. I was able to leave a message where I clearly identify myself as [name], MD and I left a message to call us back to reschedule the call.   Post-Procedure Evaluation  Procedure: Diagnostic bilateral lumbar facet block #1 under fluoroscopic guidance and IV sedation Pre-procedure pain level:  8/10 Post-procedure: 0/10 (100% relief)  Sedation: Sedation provided.  Dewayne Shorter, RN  07/07/2019  1:36 PM  Signed Pain relief after procedure (treated area only): (Questions asked to patient) 1. Starting about 15 minutes after the procedure, and "while the area was still numb" (from the local anesthetics), were you having any of your usual pain "in that area"? (NOT including the discomfort from the needle sticks.) First 1 hour: 100% better. First 4-6 hours: 100 % better. 2. Assuming that it did get numb. How long was the area numb? 100 % benefit, longer than 6 hours. How long? 24hrs 3. How much better is your pain now, when compared to before the procedure? Current benefit: 80 % better. 4. Can you move better now? Improvement in ROM (Range of Motion): Yes. 5. Can you do more now? Improvement in function: Yes. 4. Did you have any problems with the procedure? Side-effects/Complications: No.   Current benefits: Defined as benefit that persist at this time.   Analgesia:  >75% relief Function: Ms. Cardoza reports improvement in function ROM: Ms. Sherlin reports improvement in  ROM  Pharmacotherapy Assessment  Analgesic: No opioid analgesics prescribed by our practice.   Monitoring: Pharmacotherapy: No side-effects or adverse reactions reported. [place] PMP: PDMP reviewed during this encounter.       Compliance: No problems identified. Effectiveness: Clinically acceptable. Plan: Refer to "POC".  UDS: No results found for: SUMMARY  Follow-up plan:   No follow-ups on file.      Interventional management options: Planned, scheduled, and/or pending:    Diagnostic bilateral L5 TFESI #2 under fluoro and IV sedation. (Today)   Considering:   NOTE: Xarelto Anticoagulation. (Stop: 3 days  Restart: 6 hrs) CONTRAST ALLERGY.  Diagnosticbilateral L5 TFESI #3  (HX of not following-up after procedure) Diagnosticbilateral lumbar facet block  Possiblebilateral facet RFA    PRN Procedures:   None at this time     Recent Visits Date Type Provider Dept  06/23/19 Procedure visit Milinda Pointer, MD [facility]  06/13/19 Office Visit Milinda Pointer, MD [facility]  05/31/19 Procedure visit Milinda Pointer, MD [facility]  05/11/19 Office Visit Milinda Pointer, MD [facility]  Showing recent visits within past 90 days and meeting all other requirements   Today's Visits Date Type Provider Dept  07/11/19 Telemedicine Milinda Pointer, MD [facility]  Showing today's visits and meeting all other requirements   Future Appointments No visits were found meeting these conditions.  Showing future appointments within next 90 days and meeting all other requirements    Note by: [name], MD Date: 07/11/2019; Time: 3:16 PM

## 2019-07-11 ENCOUNTER — Ambulatory Visit: Payer: Medicaid Other | Attending: Pain Medicine | Admitting: Pain Medicine

## 2019-07-11 ENCOUNTER — Other Ambulatory Visit: Payer: Self-pay

## 2019-07-11 DIAGNOSIS — M47816 Spondylosis without myelopathy or radiculopathy, lumbar region: Secondary | ICD-10-CM

## 2019-07-11 DIAGNOSIS — G8929 Other chronic pain: Secondary | ICD-10-CM

## 2019-07-11 DIAGNOSIS — G894 Chronic pain syndrome: Secondary | ICD-10-CM

## 2019-07-11 DIAGNOSIS — M5441 Lumbago with sciatica, right side: Secondary | ICD-10-CM

## 2019-07-11 DIAGNOSIS — R5383 Other fatigue: Secondary | ICD-10-CM | POA: Insufficient documentation

## 2019-07-11 DIAGNOSIS — M5442 Lumbago with sciatica, left side: Secondary | ICD-10-CM

## 2019-07-11 DIAGNOSIS — B9689 Other specified bacterial agents as the cause of diseases classified elsewhere: Secondary | ICD-10-CM | POA: Insufficient documentation

## 2019-07-11 NOTE — Progress Notes (Signed)
Marked appt as no show

## 2019-07-11 NOTE — Progress Notes (Signed)
Unsuccessful attempt to contact patient for Virtual Visit (Pain Management Telehealth)   Patient provided contact information:  [phone] (home); [phone] (mobile); (Preferred) [phone] No e-mail address on record   Pre-screening:  Our staff was successful in contacting [name] using the above provided information.   I unsuccessfully attempted to make contact with [name] on 07/11/2019 via telephone. I was unable to complete the virtual encounter due to call going directly to voicemail. I was able to leave a message where I clearly identify myself as [name], MD and I left a message to call us back to reschedule the call.  Pharmacotherapy Assessment  Analgesic: No opioid analgesics prescribed by our practice.   Post-Procedure Evaluation  Procedure: Diagnostic bilateral lumbar facet block #1 under fluoroscopic guidance and IV sedation Pre-procedure pain level:  8/10 Post-procedure: 0/10 (100% relief)  Sedation: Sedation provided.  Dewayne Shorter, RN  07/07/2019  1:36 PM  Signed Pain relief after procedure (treated area only): (Questions asked to patient) 1. Starting about 15 minutes after the procedure, and "while the area was still numb" (from the local anesthetics), were you having any of your usual pain "in that area"? (NOT including the discomfort from the needle sticks.) First 1 hour: 100% better. First 4-6 hours: 100 % better. 2. Assuming that it did get numb. How long was the area numb? 100 % benefit, longer than 6 hours. How long? 24hrs 3. How much better is your pain now, when compared to before the procedure? Current benefit: 80 % better. 4. Can you move better now? Improvement in ROM (Range of Motion): Yes. 5. Can you do more now? Improvement in function: Yes. 4. Did you have any problems with the procedure? Side-effects/Complications: No.   Follow-up plan:   No follow-ups on file.      Interventional management options: Planned, scheduled,  and/or pending:    Diagnostic bilateral L5 TFESI #2 under fluoro and IV sedation. (Today)   Considering:   NOTE: Xarelto Anticoagulation. (Stop: 3 days  Restart: 6 hrs) CONTRAST ALLERGY.  Diagnosticbilateral L5 TFESI #3  (HX of not following-up after procedure) Diagnosticbilateral lumbar facet block  Possiblebilateral facet RFA    PRN Procedures:   None at this time    Recent Visits Date Type Provider Dept  06/23/19 Procedure visit Milinda Pointer, MD [facility]  06/13/19 Office Visit Milinda Pointer, MD [facility]  05/31/19 Procedure visit Milinda Pointer, MD [facility]  05/11/19 Office Visit Milinda Pointer, MD [facility]  Showing recent visits within past 90 days and meeting all other requirements   Today's Visits Date Type Provider Dept  07/11/19 Telemedicine Milinda Pointer, MD [facility]  Showing today's visits and meeting all other requirements   Future Appointments No visits were found meeting these conditions.  Showing future appointments within next 90 days and meeting all other requirements    Note by: [name], MD Date: 07/11/2019; Time: 3:22 PM

## 2019-07-12 DIAGNOSIS — M4306 Spondylolysis, lumbar region: Secondary | ICD-10-CM | POA: Insufficient documentation

## 2019-08-01 DIAGNOSIS — Z95810 Presence of automatic (implantable) cardiac defibrillator: Secondary | ICD-10-CM | POA: Insufficient documentation

## 2019-08-16 DIAGNOSIS — Z8679 Personal history of other diseases of the circulatory system: Secondary | ICD-10-CM | POA: Insufficient documentation

## 2019-09-01 DIAGNOSIS — T8131XA Disruption of external operation (surgical) wound, not elsewhere classified, initial encounter: Secondary | ICD-10-CM | POA: Insufficient documentation

## 2019-11-08 DIAGNOSIS — R42 Dizziness and giddiness: Secondary | ICD-10-CM | POA: Insufficient documentation

## 2019-11-08 DIAGNOSIS — R413 Other amnesia: Secondary | ICD-10-CM | POA: Insufficient documentation

## 2019-11-08 DIAGNOSIS — B351 Tinea unguium: Secondary | ICD-10-CM | POA: Insufficient documentation

## 2019-11-24 ENCOUNTER — Encounter: Payer: Self-pay | Admitting: Pain Medicine

## 2019-11-26 ENCOUNTER — Other Ambulatory Visit: Payer: Self-pay | Admitting: Pain Medicine

## 2019-11-27 NOTE — Progress Notes (Signed)
Patient: Claire Mcdonald  Service Category: E/M  Provider: [name], MD  DOB: 1966-02-22  DOS: 11/28/2019  Location: Office  MRN: [id number]  Setting: Ambulatory outpatient  Referring Provider: [facility]  Type: Established Patient  Specialty: Interventional Pain Management  PCP: [facility]  Location: Remote location  Delivery: TeleHealth     Virtual Encounter - Pain Management PROVIDER NOTE: Information contained herein reflects review and annotations entered in association with encounter. Interpretation of such information and data should be left to medically-trained personnel. Information provided to patient can be located elsewhere in the medical record under "Patient Instructions". Document created using STT-dictation technology, any transcriptional errors that may result from process are unintentional.    Contact & Pharmacy Preferred: [phone] Home: [phone] (home) Mobile: [phone] (mobile) E-mail: No e-mail address on record  [facility] [place] [facility] [address] [place] [address] Phone: [phone] Fax: [phone]  [facility] - [place], [place] - [address] [place] [address] Phone: [phone] Fax: [phone]   Pre-screening  Claire Mcdonald offered "in-person" vs "virtual" encounter. She indicated preferring virtual for this encounter.   Reason COVID-19*  Social distancing based on CDC and AMA recommendations.   I contacted Claire Mcdonald on 11/28/2019 via telephone.      I clearly identified myself as [name], MD. I verified that I was speaking with the correct person using two identifiers (Name: Claire Mcdonald, and date of birth: 03/24/1966).  Consent I sought verbal advanced consent from Claire Mcdonald for virtual visit interactions. I informed Claire Mcdonald of possible security and privacy concerns, risks, and limitations associated with providing "not-in-person"  medical evaluation and management services. I also informed Claire Mcdonald of the availability of "in-person" appointments. Finally, I informed her that there would be a charge for the virtual visit and that she could be  personally, fully or partially, financially responsible for it. Claire Mcdonald expressed understanding and agreed to proceed.   Historic Elements   Claire Mcdonald is a 54 y.o. year old, female patient evaluated today after her last contact with our practice on 11/26/2019. 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 She also  has a past surgical history that includes Tubal ligation (1998); Ovarian cyst removal (Left); central line (03/25/2015); Hand surgery (Left); Cardiac catheterization (N/A, 03/27/2015); Cardiac catheterization (N/A, 04/11/2015); Cardiac catheterization (N/A, 07/16/2015); Colonoscopy with propofol (N/A, 10/01/2015); Esophagogastroduodenoscopy (egd) with propofol (N/A, 10/01/2015); and Abdominal hysterectomy. Claire Mcdonald has a current medication list which includes the following prescription(s): acetaminophen, albuterol, atorvastatin, gnp calcium 1200, vitamin d3, famotidine, fluticasone, furosemide, gabapentin, hydroxyzine, levetiracetam, lisinopril, magnesium, metoprolol tartrate, promethazine, rivaroxaban, topiramate, triamcinolone, and cetirizine. She  reports that she has never smoked. She has never used smokeless tobacco. She reports that she does not drink alcohol or use drugs. Claire Mcdonald is allergic to tape; iodinated diagnostic agents; toradol [ketorolac tromethamine]; tramadol; and zofran  [ondansetron hcl].   HPI  Today, she is being contacted for worsening of previously known (established) problem.  The patient indicated having bilateral hip pain, however, when I asked her to specify what she was calling the hip it turns  out to be the upper buttocks area or in the region where she normally has referred pain from the lumbar facet disease.  To confirm this I asked the patient to perform a provocative hyperextension maneuver which triggered pain bilaterally.  I then proceeded to ask her to do a provocative hyperextension on rotation maneuver which then reproduce the pain ipsilateral to the maneuver performed.  This again confirmed that we are still dealing with the lumbar facet syndrome.  In the past we have done a diagnostic lumbar facet block which did provide patient with significant relief of her low back pain.  Some of these procedures have also been done by Dr. Mohammed Kindle, her prior pain physician.  Pharmacotherapy Assessment  Analgesic: No opioid analgesics prescribed by our practice.   Monitoring:  PMP: PDMP reviewed during this encounter.       Pharmacotherapy: No side-effects or adverse reactions reported. Compliance: No problems identified. Effectiveness: Clinically acceptable. Plan: Refer to "POC".  UDS: No results found for: SUMMARY Laboratory Chemistry Profile   Renal Lab Results  Component Value Date   BUN 21 (H) 04/05/2019   CREATININE 0.71 04/05/2019   BCR 21 04/16/2015   GFRAA >60 04/05/2019   GFRNONAA >60 04/05/2019     Hepatic Lab Results  Component Value Date   AST 17 03/27/2019   ALT 12 03/27/2019   ALBUMIN 4.0 03/27/2019   ALKPHOS 75 03/27/2019   LIPASE 29 03/27/2019     Electrolytes Lab Results  Component Value Date   NA 137 04/05/2019   K 4.3 04/05/2019   CL 101 04/05/2019   CALCIUM 9.7 04/05/2019   MG 2.2 11/30/2017   PHOS 3.8 02/19/2017     Bone Lab Results  Component Value Date   VD25OH 19.3 (L) 11/30/2017      Inflammation (CRP: Acute Phase) (ESR: Chronic Phase) Lab Results  Component Value Date   CRP <0.8 11/30/2017   ESRSEDRATE 19 11/30/2017   LATICACIDVEN 0.8 03/26/2015       Note: Above Lab results reviewed.  Imaging  DG PAIN CLINIC C-ARM 1-60 MIN NO REPORT Fluoro was used, but no Radiologist interpretation will be provided.  Please refer to "NOTES" tab for provider progress note.  Assessment  The primary encounter diagnosis was Chronic pain syndrome. Diagnoses of Chronic low back pain (Primary Area of Pain) (Bilateral) (R>L), Chronic lower extremity pain (Secondary Area of Pain) (Bilateral) (R>L), Lumbar facet arthropathy, Lumbar facet syndrome (Bilateral) (R>L), and Chronic anticoagulation (Xarelto) were also pertinent to this visit.  Plan of Care  Problem-specific:  No problem-specific Assessment & Plan notes found for this encounter.  Ms. SALLE Mcdonald has a current medication list which includes the following long-term medication(s): albuterol, atorvastatin, gnp calcium 1200, famotidine, fluticasone, furosemide, lisinopril, metoprolol tartrate, rivaroxaban, topiramate, and cetirizine.  Pharmacotherapy (Medications Ordered): No orders of the defined types were placed in this encounter.  Orders:  Orders Placed This Encounter  Procedures  . LUMBAR FACET(MEDIAL BRANCH NERVE BLOCK) MBNB    Standing Status:   Future    Standing Expiration Date:   12/28/2019    Scheduling Instructions:     Procedure: Lumbar facet block (AKA.: Lumbosacral medial branch nerve block)     Side: Bilateral     Level: L3-4, L4-5, & L5-S1 Facets (L2, L3, L4, L5, & S1 Medial Branch Nerves)     Sedation: Patient's choice.     Timeframe: ASAA    Order Specific Question:   Where will this procedure be performed?  Answer:   [facility]   Follow-up plan:   Return for Procedure (w/ sedation): (B) L-FCT Blk #2, (Blood-thinner Protocol).      Interventional management options: Planned, scheduled,  and/or pending:    Diagnostic bilateral L5 TFESI #2 under fluoro and IV sedation. (Today)   Considering:   (Prior patient of Dr. Mohammed Kindle) NOTE: Xarelto Anticoagulation. (Stop: 3 days  Restart: 6 hrs) CONTRAST ALLERGY.  Diagnosticbilateral L5 TFESI #3  (HX of not following-up after procedures) Possiblebilateral lumbar facet RFA    PRN Procedures:   Palliative bilateral lumbar facet block #2  Palliative bilateral L5 TFESI #3     Recent Visits No visits were found meeting these conditions.  Showing recent visits within past 90 days and meeting all other requirements   Today's Visits Date Type Provider Dept  11/28/19 Office Visit Milinda Pointer, MD [facility]  Showing today's visits and meeting all other requirements   Future Appointments No visits were found meeting these conditions.  Showing future appointments within next 90 days and meeting all other requirements   I discussed the assessment and treatment plan with the patient. The patient was provided an opportunity to ask questions and all were answered. The patient agreed with the plan and demonstrated an understanding of the instructions.  Patient advised to call back or seek an in-person evaluation if the symptoms or condition worsens.  Duration of encounter: 15 minutes.  Note by: [name], MD Date: 11/28/2019; Time: 11:12 AM

## 2019-11-28 ENCOUNTER — Ambulatory Visit: Payer: Medicaid Other | Attending: Pain Medicine | Admitting: Pain Medicine

## 2019-11-28 ENCOUNTER — Other Ambulatory Visit: Payer: Self-pay

## 2019-11-28 DIAGNOSIS — G894 Chronic pain syndrome: Secondary | ICD-10-CM

## 2019-11-28 DIAGNOSIS — M79605 Pain in left leg: Secondary | ICD-10-CM

## 2019-11-28 DIAGNOSIS — M47816 Spondylosis without myelopathy or radiculopathy, lumbar region: Secondary | ICD-10-CM | POA: Diagnosis not present

## 2019-11-28 DIAGNOSIS — M5441 Lumbago with sciatica, right side: Secondary | ICD-10-CM

## 2019-11-28 DIAGNOSIS — M5442 Lumbago with sciatica, left side: Secondary | ICD-10-CM

## 2019-11-28 DIAGNOSIS — Z7901 Long term (current) use of anticoagulants: Secondary | ICD-10-CM

## 2019-11-28 DIAGNOSIS — M79604 Pain in right leg: Secondary | ICD-10-CM | POA: Diagnosis not present

## 2019-11-28 DIAGNOSIS — G8929 Other chronic pain: Secondary | ICD-10-CM

## 2019-11-28 NOTE — Patient Instructions (Addendum)
____________________________________________________________________________________________  Preparing for Procedure with Sedation  Procedure appointments are limited to planned procedures: . No Prescription Refills. . No disability issues will be discussed. . No medication changes will be discussed.  Instructions: . Oral Intake: Do not eat or drink anything for at least 3 hours prior to your procedure. (Exception: Blood Pressure Medication. See below.) . Transportation: Unless otherwise stated by your physician, you may drive yourself after the procedure. . Blood Pressure Medicine: Do not forget to take your blood pressure medicine with a sip of water the morning of the procedure. If your Diastolic (lower reading)is above 100 mmHg, elective cases will be cancelled/rescheduled. . Blood thinners: These will need to be stopped for procedures. Notify our staff if you are taking any blood thinners. Depending on which one you take, there will be specific instructions on how and when to stop it. . Diabetics on insulin: Notify the staff so that you can be scheduled 1st case in the morning. If your diabetes requires high dose insulin, take only  of your normal insulin dose the morning of the procedure and notify the staff that you have done so. . Preventing infections: Shower with an antibacterial soap the morning of your procedure. . Build-up your immune system: Take 1000 mg of Vitamin C with every meal (3 times a day) the day prior to your procedure. . Antibiotics: Inform the staff if you have a condition or reason that requires you to take antibiotics before dental procedures. . Pregnancy: If you are pregnant, call and cancel the procedure. . Sickness: If you have a cold, fever, or any active infections, call and cancel the procedure. . Arrival: You must be in the facility at least 30 minutes prior to your scheduled procedure. . Children: Do not bring children with you. . Dress appropriately:  Bring dark clothing that you would not mind if they get stained. . Valuables: Do not bring any jewelry or valuables.  Reasons to call and reschedule or cancel your procedure: (Following these recommendations will minimize the risk of a serious complication.) . Surgeries: Avoid having procedures within 2 weeks of any surgery. (Avoid for 2 weeks before or after any surgery). . Flu Shots: Avoid having procedures within 2 weeks of a flu shots or . (Avoid for 2 weeks before or after immunizations). . Barium: Avoid having a procedure within 7-10 days after having had a radiological study involving the use of radiological contrast. (Myelograms, Barium swallow or enema study). . Heart attacks: Avoid any elective procedures or surgeries for the initial 6 months after a "Myocardial Infarction" (Heart Attack). . Blood thinners: It is imperative that you stop these medications before procedures. Let us know if you if you take any blood thinner.  . Infection: Avoid procedures during or within two weeks of an infection (including chest colds or gastrointestinal problems). Symptoms associated with infections include: Localized redness, fever, chills, night sweats or profuse sweating, burning sensation when voiding, cough, congestion, stuffiness, runny nose, sore throat, diarrhea, nausea, vomiting, cold or Flu symptoms, recent or current infections. It is specially important if the infection is over the area that we intend to treat. . Heart and lung problems: Symptoms that may suggest an active cardiopulmonary problem include: cough, chest pain, breathing difficulties or shortness of breath, dizziness, ankle swelling, uncontrolled high or unusually low blood pressure, and/or palpitations. If you are experiencing any of these symptoms, cancel your procedure and contact your primary care physician for an evaluation.  Remember:  Regular Business hours are:    Monday to Thursday 8:00 AM to 4:00 PM  Provider's  Schedule: Mariama Saintvil, MD:  Procedure days: Tuesday and Thursday 7:30 AM to 4:00 PM  Bilal Lateef, MD:  Procedure days: Monday and Wednesday 7:30 AM to 4:00 PM ____________________________________________________________________________________________   ____________________________________________________________________________________________  Blood Thinners  IMPORTANT NOTICE:  If you take any of these, make sure to notify the nursing staff.  Failure to do so may result in injury.  Recommended time intervals to stop and restart blood-thinners, before & after invasive procedures  Generic Name Brand Name Stop Time. Must be stopped at least this long before procedures. After procedures, wait at least this long before re-starting.  Abciximab Reopro 15 days 2 hrs  Alteplase Activase 10 days 10 days  Anagrelide Agrylin    Apixaban Eliquis 3 days 6 hrs  Cilostazol Pletal 3 days 5 hrs  Clopidogrel Plavix 7-10 days 2 hrs  Dabigatran Pradaxa 5 days 6 hrs  Dalteparin Fragmin 24 hours 4 hrs  Dipyridamole Aggrenox 11days 2 hrs  Edoxaban L[place]iana; Savaysa 3 days 2 hrs  Enoxaparin  Lovenox 24 hours 4 hrs  Eptifibatide Integrillin 8 hours 2 hrs  Fondaparinux  Arixtra 72 hours 12 hrs  Prasugrel Effient 7-10 days 6 hrs  Reteplase Retavase 10 days 10 days  Rivaroxaban Xarelto 3 days 6 hrs  Ticagrelor Brilinta 5-7 days 6 hrs  Ticlopidine Ticlid 10-14 days 2 hrs  Tinzaparin Innohep 24 hours 4 hrs  Tirofiban Aggrastat 8 hours 2 hrs  Warfarin Coumadin 5 days 2 hrs   Other medications with blood-thinning effects  Product indications Generic (Brand) names Note  Cholesterol Lipitor Stop 4 days before procedure  Blood thinner (injectable) Heparin (LMW or LMWH Heparin) Stop 24 hours before procedure  Cancer Ibrutinib (Imbruvica) Stop 7 days before procedure  Malaria/Rheumatoid Hydroxychloroquine (Plaquenil) Stop 11 days before procedure  Thrombolytics  10 days before or after procedures    Over-the-counter (OTC) Products with blood-thinning effects  Product Common names Stop Time  Aspirin > 325 mg Goody Powders, Excedrin, etc. 11 days  Aspirin ? 81 mg  7 days  Fish oil  4 days  Garlic supplements  7 days  Ginkgo biloba  36 hours  Ginseng  24 hours  NSAIDs Ibuprofen, Naprosyn, etc. 3 days  Vitamin E  4 days   ____________________________________________________________________________________________   

## 2019-12-01 DIAGNOSIS — J301 Allergic rhinitis due to pollen: Secondary | ICD-10-CM | POA: Insufficient documentation

## 2019-12-01 DIAGNOSIS — N3000 Acute cystitis without hematuria: Secondary | ICD-10-CM | POA: Insufficient documentation

## 2019-12-18 DIAGNOSIS — G459 Transient cerebral ischemic attack, unspecified: Secondary | ICD-10-CM | POA: Insufficient documentation

## 2019-12-20 ENCOUNTER — Ambulatory Visit: Payer: Medicaid Other | Admitting: Pain Medicine

## 2019-12-20 MED ORDER — ATORVASTATIN CALCIUM 40 MG PO TABS
40.00 | ORAL_TABLET | ORAL | Status: DC
Start: 2019-12-20 — End: 2019-12-20

## 2019-12-20 MED ORDER — RIVAROXABAN 20 MG PO TABS
20.00 | ORAL_TABLET | ORAL | Status: DC
Start: 2019-12-19 — End: 2019-12-20

## 2019-12-20 MED ORDER — SODIUM CHLORIDE FLUSH 0.9 % IV SOLN
5.00 | INTRAVENOUS | Status: DC
Start: 2019-12-19 — End: 2019-12-20

## 2019-12-20 MED ORDER — LIDOCAINE 5 % EX PTCH
1.00 | MEDICATED_PATCH | CUTANEOUS | Status: DC
Start: ? — End: 2019-12-20

## 2019-12-20 MED ORDER — LEVETIRACETAM 500 MG PO TABS
500.00 | ORAL_TABLET | ORAL | Status: DC
Start: 2019-12-19 — End: 2019-12-20

## 2019-12-20 MED ORDER — FLUTICASONE PROPIONATE 50 MCG/ACT NA SUSP
1.00 | NASAL | Status: DC
Start: 2019-12-20 — End: 2019-12-20

## 2019-12-20 MED ORDER — METOPROLOL TARTRATE 25 MG PO TABS
12.50 | ORAL_TABLET | ORAL | Status: DC
Start: 2019-12-19 — End: 2019-12-20

## 2019-12-20 MED ORDER — LIDOCAINE HCL 1 % IJ SOLN
0.50 | INTRAMUSCULAR | Status: DC
Start: ? — End: 2019-12-20

## 2019-12-20 MED ORDER — GABAPENTIN 300 MG PO CAPS
300.00 | ORAL_CAPSULE | ORAL | Status: DC
Start: 2019-12-19 — End: 2019-12-20

## 2019-12-20 MED ORDER — FAMOTIDINE 20 MG PO TABS
20.00 | ORAL_TABLET | ORAL | Status: DC
Start: 2019-12-20 — End: 2019-12-20

## 2019-12-20 MED ORDER — TOPIRAMATE 100 MG PO TABS
100.00 | ORAL_TABLET | ORAL | Status: DC
Start: 2019-12-19 — End: 2019-12-20

## 2019-12-20 MED ORDER — METHOCARBAMOL 500 MG PO TABS
500.00 | ORAL_TABLET | ORAL | Status: DC
Start: ? — End: 2019-12-20

## 2019-12-20 MED ORDER — SERTRALINE HCL 100 MG PO TABS
100.00 | ORAL_TABLET | ORAL | Status: DC
Start: 2019-12-20 — End: 2019-12-20

## 2019-12-20 MED ORDER — IPRATROPIUM-ALBUTEROL 0.5-2.5 (3) MG/3ML IN SOLN
3.00 | RESPIRATORY_TRACT | Status: DC
Start: ? — End: 2019-12-20

## 2019-12-20 NOTE — Progress Notes (Deleted)
Appointment canceled patient at [facility] ER.

## 2019-12-26 DIAGNOSIS — G8929 Other chronic pain: Secondary | ICD-10-CM | POA: Insufficient documentation

## 2020-01-05 ENCOUNTER — Other Ambulatory Visit: Payer: Self-pay

## 2020-01-05 ENCOUNTER — Encounter: Payer: Self-pay | Admitting: Pain Medicine

## 2020-01-05 ENCOUNTER — Ambulatory Visit
Admission: RE | Admit: 2020-01-05 | Discharge: 2020-01-05 | Disposition: A | Discharge status: Home / Self Care | Payer: Medicaid Other | Source: Ambulatory Visit | Attending: Pain Medicine | Admitting: Pain Medicine

## 2020-01-05 ENCOUNTER — Ambulatory Visit: Payer: Medicaid Other | Admitting: Pain Medicine

## 2020-01-05 VITALS — BP 128/96 | HR 72 | Temp 97.1°F | Resp 13 | Ht 62.0 in | Wt 187.0 lb

## 2020-01-05 DIAGNOSIS — M47817 Spondylosis without myelopathy or radiculopathy, lumbosacral region: Secondary | ICD-10-CM | POA: Insufficient documentation

## 2020-01-05 DIAGNOSIS — Z7901 Long term (current) use of anticoagulants: Secondary | ICD-10-CM

## 2020-01-05 DIAGNOSIS — Z91041 Radiographic dye allergy status: Secondary | ICD-10-CM | POA: Diagnosis present

## 2020-01-05 DIAGNOSIS — Z888 Allergy status to other drugs, medicaments and biological substances status: Secondary | ICD-10-CM | POA: Insufficient documentation

## 2020-01-05 DIAGNOSIS — M47816 Spondylosis without myelopathy or radiculopathy, lumbar region: Secondary | ICD-10-CM | POA: Insufficient documentation

## 2020-01-05 DIAGNOSIS — G8929 Other chronic pain: Secondary | ICD-10-CM | POA: Diagnosis present

## 2020-01-05 DIAGNOSIS — M5136 Other intervertebral disc degeneration, lumbar region: Secondary | ICD-10-CM | POA: Insufficient documentation

## 2020-01-05 DIAGNOSIS — M5441 Lumbago with sciatica, right side: Secondary | ICD-10-CM | POA: Diagnosis present

## 2020-01-05 DIAGNOSIS — M51369 Other intervertebral disc degeneration, lumbar region without mention of lumbar back pain or lower extremity pain: Secondary | ICD-10-CM

## 2020-01-05 DIAGNOSIS — M5442 Lumbago with sciatica, left side: Secondary | ICD-10-CM | POA: Diagnosis present

## 2020-01-05 MED ORDER — LACTATED RINGERS IV SOLN
1000.0000 mL | Freq: Once | INTRAVENOUS | Status: AC
  Administered 2020-01-05: 1000 mL via INTRAVENOUS

## 2020-01-05 MED ORDER — LIDOCAINE HCL 2 % IJ SOLN
INTRAMUSCULAR | Status: AC
  Filled 2020-01-05: qty 20

## 2020-01-05 MED ORDER — FENTANYL CITRATE (PF) 100 MCG/2ML IJ SOLN
INTRAMUSCULAR | Status: AC
  Filled 2020-01-05: qty 2

## 2020-01-05 MED ORDER — MIDAZOLAM HCL 5 MG/5ML IJ SOLN
1.0000 mg | INTRAMUSCULAR | Status: DC | PRN
  Administered 2020-01-05: 2 mg via INTRAVENOUS
  Administered 2020-01-05: 1 mg via INTRAVENOUS

## 2020-01-05 MED ORDER — LIDOCAINE HCL 2 % IJ SOLN
20.0000 mL | Freq: Once | INTRAMUSCULAR | Status: AC
  Administered 2020-01-05: 400 mg

## 2020-01-05 MED ORDER — FENTANYL CITRATE (PF) 100 MCG/2ML IJ SOLN
25.0000 ug | INTRAMUSCULAR | Status: DC | PRN
  Administered 2020-01-05: 50 ug via INTRAVENOUS

## 2020-01-05 MED ORDER — ROPIVACAINE HCL 2 MG/ML IJ SOLN
18.0000 mL | Freq: Once | INTRAMUSCULAR | Status: AC
  Administered 2020-01-05: 18 mL via PERINEURAL

## 2020-01-05 MED ORDER — MIDAZOLAM HCL 5 MG/5ML IJ SOLN
INTRAMUSCULAR | Status: AC
  Filled 2020-01-05: qty 5

## 2020-01-05 MED ORDER — ROPIVACAINE HCL 2 MG/ML IJ SOLN
INTRAMUSCULAR | Status: AC
  Filled 2020-01-05: qty 20

## 2020-01-05 MED ORDER — TRIAMCINOLONE ACETONIDE 40 MG/ML IJ SUSP
80.0000 mg | Freq: Once | INTRAMUSCULAR | Status: AC
  Administered 2020-01-05: 80 mg

## 2020-01-05 MED ORDER — TRIAMCINOLONE ACETONIDE 40 MG/ML IJ SUSP
INTRAMUSCULAR | Status: AC
  Filled 2020-01-05: qty 2

## 2020-01-05 NOTE — Progress Notes (Signed)
PROVIDER NOTE: Information contained herein reflects review and annotations entered in association with encounter. Interpretation of such information and data should be left to medically-trained personnel. Information provided to patient can be located elsewhere in the medical record under "Patient Instructions". Document created using STT-dictation technology, any transcriptional errors that may result from process are unintentional.    Patient: Claire Mcdonald  Service Category: Procedure  Provider: [name], MD  DOB: 1966/07/18  DOS: 01/05/2020  Location: [facility]  MRN: [id number]  Setting: Ambulatory - outpatient  Referring Provider: [facility]  Type: Established Patient  Specialty: Interventional Pain Management  PCP: Phys[facility], [place]   Primary Reason for Visit: Interventional Pain Management Treatment. CC: Back Pain (low)  Procedure:          Anesthesia, Analgesia, Anxiolysis:  Type: Lumbar Facet, Medial Branch Block(s) #2  Primary Purpose: Diagnostic Region: Posterolateral Lumbosacral Spine Level: L2, L3, L4, L5, & S1 Medial Branch Level(s). Injecting these levels blocks the L3-4, L4-5, and L5-S1 lumbar facet joints. Laterality: Bilateral  Type: Moderate (Conscious) Sedation combined with Local Anesthesia Indication(s): Analgesia and Anxiety Route: Intravenous (IV) IV Access: Secured Sedation: Meaningful verbal contact was maintained at all times during the procedure  Local Anesthetic: Lidocaine 1-2%  Position: Prone   Indications: 1. Lumbar facet syndrome (Bilateral) (R>L)   2. Spondylosis without myelopathy or radiculopathy, lumbosacral region   3. Lumbar facet arthropathy   4. DDD (degenerative disc disease), lumbar   5. Chronic low back pain (Primary Area of Pain) (Bilateral) (R>L)    Chronic anticoagulation (Xarelto)    History of allergy to radiographic contrast media    History of Allergy to iodine    Pain  Score: Pre-procedure: 8 /10 Post-procedure: 0-No pain/10   Pre-op Assessment:  Claire Mcdonald is a 54 y.o. (year old), female patient, seen today for interventional treatment. She  has a past surgical history that includes Tubal ligation (1998); Ovarian cyst removal (Left); central line (03/25/2015); Hand surgery (Left); Cardiac catheterization (N/A, 03/27/2015); Cardiac catheterization (N/A, 04/11/2015); Cardiac catheterization (N/A, 07/16/2015); Colonoscopy with propofol (N/A, 10/01/2015); Esophagogastroduodenoscopy (egd) with propofol (N/A, 10/01/2015); and Abdominal hysterectomy. Claire Mcdonald has a current medication list which includes the following prescription(s): acetaminophen, albuterol, atorvastatin, azelastine, gnp calcium 1200, cetirizine, vitamin d3, famotidine, fluticasone, furosemide, gabapentin, hydroxyzine, levetiracetam, lisinopril, magnesium, metoprolol tartrate, promethazine, rivaroxaban, topiramate, and triamcinolone, and the following Facility-Administered Medications: fentanyl and midazolam. Her primarily concern today is the Back Pain (low)  Initial Vital Signs:  Pulse/HCG Rate: 80ECG Heart Rate: 73 Temp: (!) 97.2 F (36.2 C) Resp: 18 BP: (!) 133/98 SpO2: 100 %  BMI: Estimated body mass index is 34.2 kg/m as calculated from the following:   Height as of this encounter: 5\' 2"  (1.575 m).   Weight as of this encounter: 187 lb (84.8 kg).  Risk Assessment: Allergies: Reviewed. She is allergic to tape; iodinated diagnostic agents; toradol [ketorolac tromethamine]; tramadol; and zofran [ondansetron hcl].  Allergy Precautions: None required Coagulopathies: Reviewed. None identified.  Blood-thinner therapy: None at this time Active Infection(s): Reviewed. None identified. Claire Mcdonald is afebrile  Site Confirmation: Claire Mcdonald was asked to confirm the procedure and laterality before marking the site Procedure checklist: Completed Consent: Before the procedure and under the influence of no  sedative(s), amnesic(s), or anxiolytics, the patient was informed of the treatment options, risks and possible complications. To fulfill our ethical and legal obligations, as recommended by the American Medical Association's Code of Ethics, I have informed the patient  of my clinical impression; the nature and purpose of the treatment or procedure; the risks, benefits, and possible complications of the intervention; the alternatives, including doing nothing; the risk(s) and benefit(s) of the alternative treatment(s) or procedure(s); and the risk(s) and benefit(s) of doing nothing. The patient was provided information about the general risks and possible complications associated with the procedure. These may include, but are not limited to: failure to achieve desired goals, infection, bleeding, organ or nerve damage, allergic reactions, paralysis, and death. In addition, the patient was informed of those risks and complications associated to Spine-related procedures, such as failure to decrease pain; infection (i.e.: Meningitis, epidural or intraspinal abscess); bleeding (i.e.: epidural hematoma, subarachnoid hemorrhage, or any other type of intraspinal or peri-dural bleeding); organ or nerve damage (i.e.: Any type of peripheral nerve, nerve root, or spinal cord injury) with subsequent damage to sensory, motor, and/or autonomic systems, resulting in permanent pain, numbness, and/or weakness of one or several areas of the body; allergic reactions; (i.e.: anaphylactic reaction); and/or death. Furthermore, the patient was informed of those risks and complications associated with the medications. These include, but are not limited to: allergic reactions (i.e.: anaphylactic or anaphylactoid reaction(s)); adrenal axis suppression; blood sugar elevation that in diabetics may result in ketoacidosis or comma; water retention that in patients with history of congestive heart failure may result in shortness of breath,  pulmonary edema, and decompensation with resultant heart failure; weight gain; swelling or edema; medication-induced neural toxicity; particulate matter embolism and blood vessel occlusion with resultant organ, and/or nervous system infarction; and/or aseptic necrosis of one or more joints. Finally, the patient was informed that Medicine is not an exact science; therefore, there is also the possibility of unforeseen or unpredictable risks and/or possible complications that may result in a catastrophic outcome. The patient indicated having understood very clearly. We have given the patient no guarantees and we have made no promises. Enough time was given to the patient to ask questions, all of which were answered to the patient's satisfaction. Claire Mcdonald has indicated that she wanted to continue with the procedure. Attestation: I, the ordering provider, attest that I have discussed with the patient the benefits, risks, side-effects, alternatives, likelihood of achieving goals, and potential problems during recovery for the procedure that I have provided informed consent. Date   Time: 01/05/2020  8:19 AM  Pre-Procedure Preparation:  Monitoring: As per clinic protocol. Respiration, ETCO2, SpO2, BP, heart rate and rhythm monitor placed and checked for adequate function Safety Precautions: Patient was assessed for positional comfort and pressure points before starting the procedure. Time-out: I initiated and conducted the "Time-out" before starting the procedure, as per protocol. The patient was asked to participate by confirming the accuracy of the "Time Out" information. Verification of the correct person, site, and procedure were performed and confirmed by me, the nursing staff, and the patient. "Time-out" conducted as per Joint Commission's Universal Protocol (UP.01.01.01). Time: 0906  Description of Procedure:          Laterality: Bilateral. The procedure was performed in identical fashion on both  sides. Levels:  L2, L3, L4, L5, & S1 Medial Branch Level(s) Area Prepped: Posterior Lumbosacral Region DuraPrep (Iodine Povacrylex [0.7% available iodine] and Isopropyl Alcohol, 74% w/w) Safety Precautions: Aspiration looking for blood return was conducted prior to all injections. At no point did we inject any substances, as a needle was being advanced. Before injecting, the patient was told to immediately notify me if she was experiencing any new onset of "ringing  in the ears, or metallic taste in the mouth". No attempts were made at seeking any paresthesias. Safe injection practices and needle disposal techniques used. Medications properly checked for expiration dates. SDV (single dose vial) medications used. After the completion of the procedure, all disposable equipment used was discarded in the proper designated medical waste containers. Local Anesthesia: Protocol guidelines were followed. The patient was positioned over the fluoroscopy table. The area was prepped in the usual manner. The time-out was completed. The target area was identified using fluoroscopy. A 12-in long, straight, sterile hemostat was used with fluoroscopic guidance to locate the targets for each level blocked. Once located, the skin was marked with an approved surgical skin marker. Once all sites were marked, the skin (epidermis, dermis, and hypodermis), as well as deeper tissues (fat, connective tissue and muscle) were infiltrated with a small amount of a short-acting local anesthetic, loaded on a 10cc syringe with a 25G, 1.5-in  Needle. An appropriate amount of time was allowed for local anesthetics to take effect before proceeding to the next step. Local Anesthetic: Lidocaine 2.0% The unused portion of the local anesthetic was discarded in the proper designated containers. Technical explanation of process:  L2 Medial Branch Nerve Block (MBB): The target area for the L2 medial branch is at the junction of the postero-lateral  aspect of the superior articular process and the superior, posterior, and medial edge of the transverse process of L3. Under fluoroscopic guidance, a Quincke needle was inserted until contact was made with os over the superior postero-lateral aspect of the pedicular shadow (target area). After negative aspiration for blood, 0.5 mL of the nerve block solution was injected without difficulty or complication. The needle was removed intact. L3 Medial Branch Nerve Block (MBB): The target area for the L3 medial branch is at the junction of the postero-lateral aspect of the superior articular process and the superior, posterior, and medial edge of the transverse process of L4. Under fluoroscopic guidance, a Quincke needle was inserted until contact was made with os over the superior postero-lateral aspect of the pedicular shadow (target area). After negative aspiration for blood, 0.5 mL of the nerve block solution was injected without difficulty or complication. The needle was removed intact. L4 Medial Branch Nerve Block (MBB): The target area for the L4 medial branch is at the junction of the postero-lateral aspect of the superior articular process and the superior, posterior, and medial edge of the transverse process of L5. Under fluoroscopic guidance, a Quincke needle was inserted until contact was made with os over the superior postero-lateral aspect of the pedicular shadow (target area). After negative aspiration for blood, 0.5 mL of the nerve block solution was injected without difficulty or complication. The needle was removed intact. L5 Medial Branch Nerve Block (MBB): The target area for the L5 medial branch is at the junction of the postero-lateral aspect of the superior articular process and the superior, posterior, and medial edge of the sacral ala. Under fluoroscopic guidance, a Quincke needle was inserted until contact was made with os over the superior postero-lateral aspect of the pedicular shadow  (target area). After negative aspiration for blood, 0.5 mL of the nerve block solution was injected without difficulty or complication. The needle was removed intact. S1 Medial Branch Nerve Block (MBB): The target area for the S1 medial branch is at the posterior and inferior 6 o'clock position of the L5-S1 facet joint. Under fluoroscopic guidance, the Quincke needle inserted for the L5 MBB was redirected until contact  was made with os over the inferior and postero aspect of the sacrum, at the 6 o' clock position under the L5-S1 facet joint (Target area). After negative aspiration for blood, 0.5 mL of the nerve block solution was injected without difficulty or complication. The needle was removed intact.  Nerve block solution: 0.2% PF-Ropivacaine + Triamcinolone (40 mg/mL) diluted to a final concentration of 4 mg of Triamcinolone/mL of Ropivacaine The unused portion of the solution was discarded in the proper designated containers. Procedural Needles: 22-gauge, 3.5-inch, Quincke needles used for all levels.  Once the entire procedure was completed, the treated area was cleaned, making sure to leave some of the prepping solution back to take advantage of its long term bactericidal properties.   Illustration of the posterior view of the lumbar spine and the posterior neural structures. Laminae of L2 through S1 are labeled. DPRL5, dorsal primary ramus of L5; DPRS1, dorsal primary ramus of S1; DPR3, dorsal primary ramus of L3; FJ, facet (zygapophyseal) joint L3-L4; I, inferior articular process of L4; LB1, lateral branch of dorsal primary ramus of L1; IAB, inferior articular branches from L3 medial branch (supplies L4-L5 facet joint); IBP, intermediate branch plexus; MB3, medial branch of dorsal primary ramus of L3; NR3, third lumbar nerve root; S, superior articular process of L5; SAB, superior articular branches from L4 (supplies L4-5 facet joint also); TP3, transverse process of L3.  Vitals:   01/05/20  0920 01/05/20 0925 01/05/20 0935 01/05/20 0947  BP: (!) 118/94 113/90 (!) 124/103 (!) 128/96  Pulse: 72     Resp: 16 16 20 13   Temp:  (!) 97.2 F (36.2 C)  (!) 97.1 F (36.2 C)  TempSrc:  Temporal  Temporal  SpO2: 97% 97% 99% 100%  Weight:      Height:         Start Time: 0906 hrs. End Time: 0919 hrs.  Imaging Guidance (Spinal):          Type of Imaging Technique: Fluoroscopy Guidance (Spinal) Indication(s): Assistance in needle guidance and placement for procedures requiring needle placement in or near specific anatomical locations not easily accessible without such assistance. Exposure Time: Please see nurses notes. Contrast: None used. Fluoroscopic Guidance: I was personally present during the use of fluoroscopy. "Tunnel Vision Technique" used to obtain the best possible view of the target area. Parallax error corrected before commencing the procedure. "Direction-depth-direction" technique used to introduce the needle under continuous pulsed fluoroscopy. Once target was reached, antero-posterior, oblique, and lateral fluoroscopic projection used confirm needle placement in all planes. Images permanently stored in EMR. Interpretation: No contrast injected. I personally interpreted the imaging intraoperatively. Adequate needle placement confirmed in multiple planes. Permanent images saved into the patient's record.  Antibiotic Prophylaxis:   Anti-infectives (From admission, onward)   None     Indication(s): None identified  Post-operative Assessment:  Post-procedure Vital Signs:  Pulse/HCG Rate: 7276 Temp: (!) 97.1 F (36.2 C) Resp: 13 BP: (!) 128/96 SpO2: 100 %  EBL: None  Complications: No immediate post-treatment complications observed by team, or reported by patient.  Note: The patient tolerated the entire procedure well. A repeat set of vitals were taken after the procedure and the patient was kept under observation following institutional policy, for this type of  procedure. Post-procedural neurological assessment was performed, showing return to baseline, prior to discharge. The patient was provided with post-procedure discharge instructions, including a section on how to identify potential problems. Should any problems arise concerning this procedure, the patient was given instructions  to immediately contact us, at any time, without hesitation. In any case, we plan to contact the patient by telephone for a follow-up status report regarding this interventional procedure.  Comments:  No additional relevant information.  Plan of Care  Orders:  Orders Placed This Encounter  Procedures   LUMBAR FACET(MEDIAL BRANCH NERVE BLOCK) MBNB    Scheduling Instructions:     Procedure: Lumbar facet block (AKA.: Lumbosacral medial branch nerve block)     Side: Bilateral     Level: L3-4, L4-5, & L5-S1 Facets (L2, L3, L4, L5, & S1 Medial Branch Nerves)     Sedation: Patient's choice.     Timeframe: Today    Order Specific Question:   Where will this procedure be performed?    Answer:   [facility]   DG [facility] C-ARM 1-60 MIN NO REPORT    Intraoperative interpretation by procedural physician at [facility].    Standing Status:   Standing    Number of Occurrences:   1    Order Specific Question:   Reason for exam:    Answer:   Assistance in needle guidance and placement for procedures requiring needle placement in or near specific anatomical locations not easily accessible without such assistance.   Informed Consent Details: Physician/Practitioner Attestation; Transcribe to consent form and obtain patient signature    Nursing Order: Transcribe to consent form and obtain patient signature. Note: Always confirm laterality of pain with Claire Mcdonald, before procedure. Procedure: Lumbar Facet Block  under fluoroscopic guidance Indication/Reason: Low Back Pain, with our without leg pain, due to Facet Joint Arthralgia (Joint Pain) known as Lumbar Facet  Syndrome, secondary to Lumbar, and/or Lumbosacral Spondylosis (Arthritis of the Spine), without myelopathy or radiculopathy (Nerve Damage). Provider Attestation: I, Lehigh Dossie Arbour, MD, (Pain Management Specialist), the physician/practitioner, attest that I have discussed with the patient the benefits, risks, side effects, alternatives, likelihood of achieving goals and potential problems during recovery for the procedure that I have provided informed consent.   Care order/instruction: Please confirm that the patient has stopped the Xarelto (Rivaroxaban) x 3 days prior to procedure or surgery.    Please confirm that the patient has stopped the Xarelto (Rivaroxaban) x 3 days prior to procedure or surgery.    Standing Status:   Standing    Number of Occurrences:   1   Provide equipment / supplies at bedside    Equipment required: Single use, disposable, "Block Tray"    Standing Status:   Standing    Number of Occurrences:   1    Order Specific Question:   Specify    Answer:   Block Tray   Bleeding precautions    Standing Status:   Standing    Number of Occurrences:   1   Miscellanous precautions    Standing Status:   Standing    Number of Occurrences:   1   Miscellanous precautions    NOTE: Although It is true that patients can have allergies to shellfish and that shellfish contain iodine, most shellfish  allergies are due to two protein allergens present in the shellfish: tropomyosins and parvalbumin. Not all patients with shellfish allergies are allergic to iodine. However, as a precaution, avoid using iodine containing products.    Standing Status:   Standing    Number of Occurrences:   1   Chronic Opioid Analgesic:  No opioid analgesics prescribed by our practice.   Medications ordered for procedure: Meds ordered this encounter  Medications   lidocaine (XYLOCAINE) 2 % (with pres) injection 400 mg   lactated ringers infusion 1,000 mL   midazolam (VERSED) 5 MG/5ML injection  1-2 mg    Make sure Flumazenil is available in the pyxis when using this medication. If oversedation occurs, administer 0.2 mg IV over 15 sec. If after 45 sec no response, administer 0.2 mg again over 1 min; may repeat at 1 min intervals; not to exceed 4 doses (1 mg)   fentaNYL (SUBLIMAZE) injection 25-50 mcg    Make sure Narcan is available in the pyxis when using this medication. In the event of respiratory depression (RR< 8/min): Titrate NARCAN (naloxone) in increments of 0.1 to 0.2 mg IV at 2-3 minute intervals, until desired degree of reversal.   ropivacaine (PF) 2 mg/mL (0.2%) (NAROPIN) injection 18 mL   triamcinolone acetonide (KENALOG-40) injection 80 mg   Medications administered: We administered lidocaine, lactated ringers, midazolam, fentaNYL, ropivacaine (PF) 2 mg/mL (0.2%), and triamcinolone acetonide.  See the medical record for exact dosing, route, and time of administration.  Follow-up plan:   Return in about 2 weeks (around 01/19/2020) for (VV), (PP).       Interventional management options: Planned, scheduled, and/or pending:    Diagnostic bilateral L5 TFESI #2 under fluoro and IV sedation. (Today)   Considering:   (Prior patient of Dr. Mohammed Kindle) NOTE: Xarelto Anticoagulation. (Stop: 3 days   Restart: 6 hrs) CONTRAST ALLERGY.  Diagnosticbilateral L5 TFESI #3  (HX of not following-up after procedures) Possiblebilateral lumbar facet RFA    PRN Procedures:   Palliative bilateral lumbar facet block #2  Palliative bilateral L5 TFESI #3      Recent Visits Date Type Provider Dept  11/28/19 Office Visit Milinda Pointer, MD [facility]  Showing recent visits within past 90 days and meeting all other requirements   Today's Visits Date Type Provider Dept  01/05/20 Procedure visit Milinda Pointer, MD [facility]  Showing today's visits and meeting all other requirements   Future Appointments Date Type Provider Dept  01/19/20  Appointment Milinda Pointer, MD [facility]  Showing future appointments within next 90 days and meeting all other requirements   Disposition: Discharge home  Discharge (Date   Time): 01/05/2020; 0950 hrs.   Primary Care Physician: [facility] Location: [facility] Note by: [name], MD Date: 01/05/2020; Time: 10:25 AM  Disclaimer:  Medicine is not an exact science. The only guarantee in medicine is that nothing is guaranteed. It is important to note that the decision to proceed with this intervention was based on the information collected from the patient. The Data and conclusions were drawn from the patient's questionnaire, the interview, and the physical examination. Because the information was provided in large part by the patient, it cannot be guaranteed that it has not been purposely or unconsciously manipulated. Every effort has been made to obtain as much relevant data as possible for this evaluation. It is important to note that the conclusions that lead to this procedure are derived in large part from the available data. Always take into account that the treatment will also be dependent on availability of resources and existing treatment guidelines, considered by other Pain Management Practitioners as being common knowledge and practice, at the time of the intervention. For Medico-Legal purposes, it is also important to point out that variation in procedural techniques and pharmacological choices are the acceptable norm. The indications, contraindications, technique, and results of the above  procedure should only be interpreted and judged by a Board-Certified Interventional Pain Specialist with extensive familiarity and expertise in the same exact procedure and technique.

## 2020-01-05 NOTE — Patient Instructions (Signed)

## 2020-01-05 NOTE — Progress Notes (Signed)
Safety precautions to be maintained throughout the outpatient stay will include: orient to surroundings, keep bed in low position, maintain call bell within reach at all times, provide assistance with transfer out of bed and ambulation.  

## 2020-01-06 ENCOUNTER — Telehealth: Payer: Self-pay

## 2020-01-06 NOTE — Telephone Encounter (Signed)
Post procedure phone call.  No answer, no answering machine 

## 2020-01-19 ENCOUNTER — Ambulatory Visit: Payer: Medicaid Other | Attending: Pain Medicine | Admitting: Pain Medicine

## 2020-01-19 ENCOUNTER — Encounter: Payer: Self-pay | Admitting: Pain Medicine

## 2020-01-19 ENCOUNTER — Other Ambulatory Visit: Payer: Self-pay

## 2020-01-19 VITALS — BP 135/100 | HR 88 | Temp 97.9°F | Resp 16 | Ht 62.0 in | Wt 183.0 lb

## 2020-01-19 DIAGNOSIS — M5442 Lumbago with sciatica, left side: Secondary | ICD-10-CM

## 2020-01-19 DIAGNOSIS — M79604 Pain in right leg: Secondary | ICD-10-CM

## 2020-01-19 DIAGNOSIS — M961 Postlaminectomy syndrome, not elsewhere classified: Secondary | ICD-10-CM | POA: Diagnosis not present

## 2020-01-19 DIAGNOSIS — Z7901 Long term (current) use of anticoagulants: Secondary | ICD-10-CM

## 2020-01-19 DIAGNOSIS — M79605 Pain in left leg: Secondary | ICD-10-CM

## 2020-01-19 DIAGNOSIS — M5136 Other intervertebral disc degeneration, lumbar region: Secondary | ICD-10-CM | POA: Diagnosis not present

## 2020-01-19 DIAGNOSIS — G8929 Other chronic pain: Secondary | ICD-10-CM

## 2020-01-19 DIAGNOSIS — M5441 Lumbago with sciatica, right side: Secondary | ICD-10-CM

## 2020-01-19 DIAGNOSIS — M4310 Spondylolisthesis, site unspecified: Secondary | ICD-10-CM

## 2020-01-19 DIAGNOSIS — M47816 Spondylosis without myelopathy or radiculopathy, lumbar region: Secondary | ICD-10-CM

## 2020-01-19 NOTE — Patient Instructions (Addendum)
____________________________________________________________________________________________  General Risks and Possible Complications  Patient Responsibilities: It is important that you read this as it is part of your informed consent. It is our duty to inform you of the risks and possible complications associated with treatments offered to you. It is your responsibility as a patient to read this and to ask questions about anything that is not clear or that you believe was not covered in this document.  Patient's Rights: You have the right to refuse treatment. You also have the right to change your mind, even after initially having agreed to have the treatment done. However, under this last option, if you wait until the last second to change your mind, you may be charged for the materials used up to that point.  Introduction: Medicine is not an exact science. Everything in Medicine, including the lack of treatment(s), carries the potential for danger, harm, or loss (which is by definition: Risk). In Medicine, a complication is a secondary problem, condition, or disease that can aggravate an already existing one. All treatments carry the risk of possible complications. The fact that a side effects or complications occurs, does not imply that the treatment was conducted incorrectly. It must be clearly understood that these can happen even when everything is done following the highest safety standards.  No treatment: You can choose not to proceed with the proposed treatment alternative. The "PRO(s)" would include: avoiding the risk of complications associated with the therapy. The "CON(s)" would include: not getting any of the treatment benefits. These benefits fall under one of three categories: diagnostic; therapeutic; and/or palliative. Diagnostic benefits include: getting information which can ultimately lead to improvement of the disease or symptom(s). Therapeutic benefits are those associated with the  successful treatment of the disease. Finally, palliative benefits are those related to the decrease of the primary symptoms, without necessarily curing the condition (example: decreasing the pain from a flare-up of a chronic condition, such as incurable terminal cancer).  General Risks and Complications: These are associated to most interventional treatments. They can occur alone, or in combination. They fall under one of the following six (6) categories: no benefit or worsening of symptoms; bleeding; infection; nerve damage; allergic reactions; and/or death. 1. No benefits or worsening of symptoms: In Medicine there are no guarantees, only probabilities. No healthcare provider can ever guarantee that a medical treatment will work, they can only state the probability that it may. Furthermore, there is always the possibility that the condition may worsen, either directly, or indirectly, as a consequence of the treatment. 2. Bleeding: This is more common if the patient is taking a blood thinner, either prescription or over the counter (example: Goody Powders, Fish oil, Aspirin, Garlic, etc.), or if suffering a condition associated with impaired coagulation (example: Hemophilia, cirrhosis of the liver, low platelet counts, etc.). However, even if you do not have one on these, it can still happen. If you have any of these conditions, or take one of these drugs, make sure to notify your treating physician. 3. Infection: This is more common in patients with a compromised immune system, either due to disease (example: diabetes, cancer, human immunodeficiency virus [HIV], etc.), or due to medications or treatments (example: therapies used to treat cancer and rheumatological diseases). However, even if you do not have one on these, it can still happen. If you have any of these conditions, or take one of these drugs, make sure to notify your treating physician. 4. Nerve Damage: This is more common when the   treatment is  an invasive one, but it can also happen with the use of medications, such as those used in the treatment of cancer. The damage can occur to small secondary nerves, or to large primary ones, such as those in the spinal cord and brain. This damage may be temporary or permanent and it may lead to impairments that can range from temporary numbness to permanent paralysis and/or brain death. 5. Allergic Reactions: Any time a substance or material comes in contact with our body, there is the possibility of an allergic reaction. These can range from a mild skin rash (contact dermatitis) to a severe systemic reaction (anaphylactic reaction), which can result in death. 6. Death: In general, any medical intervention can result in death, most of the time due to an unforeseen complication. ____________________________________________________________________________________________  ____________________________________________________________________________________________  Preparing for Procedure with Sedation  Procedure appointments are limited to planned procedures: . No Prescription Refills. . No disability issues will be discussed. . No medication changes will be discussed.  Instructions: . Oral Intake: Do not eat or drink anything for at least 8 hours prior to your procedure. (Exception: Blood Pressure Medication. See below.) . Transportation: Unless otherwise stated by your physician, you may drive yourself after the procedure. . Blood Pressure Medicine: Do not forget to take your blood pressure medicine with a sip of water the morning of the procedure. If your Diastolic (lower reading)is above 100 mmHg, elective cases will be cancelled/rescheduled. . Blood thinners: These will need to be stopped for procedures. Notify our staff if you are taking any blood thinners. Depending on which one you take, there will be specific instructions on how and when to stop it. . Diabetics on insulin: Notify the staff  so that you can be scheduled 1st case in the morning. If your diabetes requires high dose insulin, take only  of your normal insulin dose the morning of the procedure and notify the staff that you have done so. . Preventing infections: Shower with an antibacterial soap the morning of your procedure. . Build-up your immune system: Take 1000 mg of Vitamin C with every meal (3 times a day) the day prior to your procedure. . Antibiotics: Inform the staff if you have a condition or reason that requires you to take antibiotics before dental procedures. . Pregnancy: If you are pregnant, call and cancel the procedure. . Sickness: If you have a cold, fever, or any active infections, call and cancel the procedure. . Arrival: You must be in the facility at least 30 minutes prior to your scheduled procedure. . Children: Do not bring children with you. . Dress appropriately: Bring dark clothing that you would not mind if they get stained. . Valuables: Do not bring any jewelry or valuables.  Reasons to call and reschedule or cancel your procedure: (Following these recommendations will minimize the risk of a serious complication.) . Surgeries: Avoid having procedures within 2 weeks of any surgery. (Avoid for 2 weeks before or after any surgery). . Flu Shots: Avoid having procedures within 2 weeks of a flu shots or . (Avoid for 2 weeks before or after immunizations). . Barium: Avoid having a procedure within 7-10 days after having had a radiological study involving the use of radiological contrast. (Myelograms, Barium swallow or enema study). . Heart attacks: Avoid any elective procedures or surgeries for the initial 6 months after a "Myocardial Infarction" (Heart Attack). . Blood thinners: It is imperative that you stop these medications before procedures. Let us know if you   if you take any blood thinner.  . Infection: Avoid procedures during or within two weeks of an infection (including chest colds or  gastrointestinal problems). Symptoms associated with infections include: Localized redness, fever, chills, night sweats or profuse sweating, burning sensation when voiding, cough, congestion, stuffiness, runny nose, sore throat, diarrhea, nausea, vomiting, cold or Flu symptoms, recent or current infections. It is specially important if the infection is over the area that we intend to treat. Marland Kitchen Heart and lung problems: Symptoms that may suggest an active cardiopulmonary problem include: cough, chest pain, breathing difficulties or shortness of breath, dizziness, ankle swelling, uncontrolled high or unusually low blood pressure, and/or palpitations. If you are experiencing any of these symptoms, cancel your procedure and contact your primary care physician for an evaluation.  Remember:  Regular Business hours are:  Monday to Thursday 8:00 AM to 4:00 PM  Provider's Schedule: Milinda Pointer, MD:  Procedure days: Tuesday and Thursday 7:30 AM to 4:00 PM  Gillis Santa, MD:  Procedure days: Monday and Wednesday 7:30 AM to 4:00 PM ____________________________________________________________________________________________  ___________________________________________________________________________________________  Post-Radiofrequency (RF) Discharge Instructions  You have just completed a Radiofrequency Neurotomy.  The following instructions will provide you with information and guidelines for self-care upon discharge.  If at any time you have questions or concerns please call your physician. DO NOT DRIVE YOURSELF!!  Instructions:  Apply ice: Fill a plastic sandwich bag with crushed ice. Cover it with a small towel and apply to injection site. Apply for 15 minutes then remove x 15 minutes. Repeat sequence on day of procedure, until you go to bed. The purpose is to minimize swelling and discomfort after procedure.  Apply heat: Apply heat to procedure site starting the day following the procedure.  The purpose is to treat any soreness and discomfort from the procedure.  Food intake: No eating limitations, unless stipulated above.  Nevertheless, if you have had sedation, you may experience some nausea.  In this case, it may be wise to wait at least two hours prior to resuming regular diet.  Physical activities: Keep activities to a minimum for the first 8 hours after the procedure. For the first 24 hours after the procedure, do not drive a motor vehicle,  Operate heavy machinery, power tools, or handle any weapons.  Consider walking with the use of an assistive device or accompanied by an adult for the first 24 hours.  Do not drink alcoholic beverages including beer.  Do not make any important decisions or sign any legal documents. Go home and rest today.  Resume activities tomorrow, as tolerated.  Use caution in moving about as you may experience mild leg weakness.  Use caution in cooking, use of household electrical appliances and climbing steps.  Driving: If you have received any sedation, you are not allowed to drive for 24 hours after your procedure.  Blood thinner: Restart your blood thinner 6 hours after your procedure. (Only for those taking blood thinners)  Insulin: As soon as you can eat, you may resume your normal dosing schedule. (Only for those taking insulin)  Medications: May resume pre-procedure medications.  Do not take any drugs, other than what has been prescribed to you.  Infection prevention: Keep procedure site clean and dry.  Post-procedure Pain Diary: Extremely important that this be done correctly and accurately. Recorded information will be used to determine the next step in treatment.  Pain evaluated is that of treated area only. Do not include pain from an untreated area.  Complete every  hour, on the hour, for the initial 8 hours. Set an alarm to help you do this part accurately.  Do not go to sleep and have it completed later. It will not be  accurate.  Follow-up appointment: Keep your follow-up appointment after the procedure. Usually 2-6 weeks after radiofrequency. Bring you pain diary. The information collected will be essential for your long-term care.   Expect:  From numbing medicine (AKA: Local Anesthetics): Numbness or decrease in pain.  Onset: Full effect within 15 minutes of injected.  Duration: It will depend on the type of local anesthetic used. On the average, 1 to 8 hours.   From steroids (when added): Decrease in swelling or inflammation. Once inflammation is improved, relief of the pain will follow.  Onset of benefits: Depends on the amount of swelling present. The more swelling, the longer it will take for the benefits to be seen. In some cases, up to 10 days.  Duration: Steroids will stay in the system x 2 weeks. Duration of benefits will depend on multiple posibilities including persistent irritating factors.  From procedure: Some discomfort is to be expected once the numbing medicine wears off. In the case of radiofrequency procedures, this may last as long as 6 weeks. Additional post-procedure pain medication is provided for this. Discomfort is minimized if ice and heat are applied as instructed.  Call if:  You experience numbness and weakness that gets worse with time, as opposed to wearing off.  He experience any unusual bleeding, difficulty breathing, or loss of the ability to control your bowel and bladder. (This applies to Spinal procedures only)  You experience any redness, swelling, heat, red streaks, elevated temperature, fever, or any other signs of a possible infection.  Emergency Numbers:  [place] hours (Monday - Thursday, 8:00 AM - 4:00 PM) (Friday, 9:00 AM - 12:00 Noon): [phone]  After hours: [phone] ____________________________________________________________________________________________   Radiofrequency Lesioning Radiofrequency lesioning is a procedure that  is performed to relieve pain. The procedure is often used for back, neck, or arm pain. Radiofrequency lesioning involves the use of a machine that creates radio waves to make heat. During the procedure, the heat is applied to the nerve that carries the pain signal. The heat damages the nerve and interferes with the pain signal. Pain relief usually starts about 2 weeks after the procedure and lasts for 6 months to 1 year. You will be awake during the procedure. You will need to be able to talk with the health care provider during the procedure. Tell a health care provider about:  Any allergies you have.  All medicines you are taking, including vitamins, herbs, eye drops, creams, and over-the-counter medicines.  Any problems you or family members have had with anesthetic medicines.  Any blood disorders you have.  Any surgeries you have had.  Any medical conditions you have or have had.  Whether you are pregnant or may be pregnant. What are the risks? Generally, this is a safe procedure. However, problems may occur, including:  Pain or soreness at the injection site.  Allergic reaction to medicines given during the procedure.  Bleeding.  Infection at the injection site.  Damage to nerves or blood vessels. What happens before the procedure? Staying hydrated Follow instructions from your health care provider about hydration, which may include:  Up to 2 hours before the procedure - you may continue to drink clear liquids, such as water, clear fruit juice, black coffee, and plain tea. Eating and drinking Follow instructions  from your health care provider about eating and drinking, which may include:  8 hours before the procedure - stop eating heavy meals or foods, such as meat, fried foods, or fatty foods.  6 hours before the procedure - stop eating light meals or foods, such as toast or cereal.  6 hours before the procedure - stop drinking milk or drinks that contain milk.  2  hours before the procedure - stop drinking clear liquids. Medicines Ask your health care provider about:  Changing or stopping your regular medicines. This is especially important if you are taking diabetes medicines or blood thinners.  Taking medicines such as aspirin and ibuprofen. These medicines can thin your blood. Do not take these medicines unless your health care provider tells you to take them.  Taking over-the-counter medicines, vitamins, herbs, and supplements. General instructions  Plan to have someone take you home from the hospital or clinic.  If you will be going home right after the procedure, plan to have someone with you for 24 hours.  Ask your health care provider what steps will be taken to help prevent infection. These may include: ? Removing hair at the procedure site. ? Washing skin with a germ-killing soap. ? Taking antibiotic medicine. What happens during the procedure?   An IV will be inserted into one of your veins.  You will be given one or more of the following: ? A medicine to help you relax (sedative). ? A medicine to numb the area (local anesthetic).  Your health care provider will insert a radiofrequency needle into the area to be treated. This is done with the help of a type of X-ray (fluoroscopy).  A wire that carries the radio waves (electrode) will be put through the radiofrequency needle.  An electrical pulse will be sent through the electrode to verify the correct nerve that is causing your pain. You will feel a tingling sensation, and you may have muscle twitching.  The tissue around the needle tip will be heated by an electric current that comes from the radiofrequency machine. This will numb the nerves.  The needle will be removed.  A bandage (dressing) will be put on the insertion area. The procedure may vary among health care providers and hospitals. What happens after the procedure?  Your blood pressure, heart rate, breathing  rate, and blood oxygen level will be monitored until you leave the hospital or clinic.  Return to your normal activities as told by your health care provider. Ask your health care provider what activities are safe for you.  Do not drive for 24 hours if you were given a sedative during your procedure. Summary  Radiofrequency lesioning is a procedure that is performed to relieve pain. The procedure is often used for back, neck, or arm pain.  Radiofrequency lesioning involves the use of a machine that creates radio waves to make heat.  Plan to have someone take you home from the hospital or clinic. Do not drive for 24 hours if you were given a sedative during your procedure.  Return to your normal activities as told by your health care provider. Ask your health care provider what activities are safe for you. This information is not intended to replace advice given to you by your health care provider. Make sure you discuss any questions you have with your health care provider. Document Revised: 04/29/2018 Document Reviewed: 04/29/2018 Elsevier Patient Education  2020 [place]. ____________________________________________________________________________________________  Blood Thinners  IMPORTANT NOTICE:  If you take any  of these, make sure to notify the nursing staff.  Failure to do so may result in injury.  Recommended time intervals to stop and restart blood-thinners, before & after invasive procedures  Generic Name Brand Name Stop Time. Must be stopped at least this long before procedures. After procedures, wait at least this long before re-starting.  Abciximab Reopro 15 days 2 hrs  Alteplase Activase 10 days 10 days  Anagrelide Agrylin    Apixaban Eliquis 3 days 6 hrs  Cilostazol Pletal 3 days 5 hrs  Clopidogrel Plavix 7-10 days 2 hrs  Dabigatran Pradaxa 5 days 6 hrs  Dalteparin Fragmin 24 hours 4 hrs  Dipyridamole Aggrenox 11days 2 hrs  Edoxaban Lixiana; Savaysa 3 days 2 hrs   Enoxaparin  Lovenox 24 hours 4 hrs  Eptifibatide Integrillin 8 hours 2 hrs  Fondaparinux  Arixtra 72 hours 12 hrs  Prasugrel Effient 7-10 days 6 hrs  Reteplase Retavase 10 days 10 days  Rivaroxaban Xarelto 3 days 6 hrs  Ticagrelor Brilinta 5-7 days 6 hrs  Ticlopidine Ticlid 10-14 days 2 hrs  Tinzaparin Innohep 24 hours 4 hrs  Tirofiban Aggrastat 8 hours 2 hrs  Warfarin Coumadin 5 days 2 hrs   Other medications with blood-thinning effects  Product indications Generic (Brand) names Note  Cholesterol Lipitor Stop 4 days before procedure  Blood thinner (injectable) Heparin (LMW or LMWH Heparin) Stop 24 hours before procedure  Cancer Ibrutinib (Imbruvica) Stop 7 days before procedure  Malaria/Rheumatoid Hydroxychloroquine (Plaquenil) Stop 11 days before procedure  Thrombolytics  10 days before or after procedures   Over-the-counter (OTC) Products with blood-thinning effects  Product Common names Stop Time  Aspirin > 325 mg Goody Powders, Excedrin, etc. 11 days  Aspirin ? 81 mg  7 days  Fish oil  4 days  Garlic supplements  7 days  Ginkgo biloba  36 hours  Ginseng  24 hours  NSAIDs Ibuprofen, Naprosyn, etc. 3 days  Vitamin E  4 days   ____________________________________________________________________________________________

## 2020-01-19 NOTE — Progress Notes (Signed)
Safety precautions to be maintained throughout the outpatient stay will include: orient to surroundings, keep bed in low position, maintain call bell within reach at all times, provide assistance with transfer out of bed and ambulation.  

## 2020-01-19 NOTE — Progress Notes (Signed)
PROVIDER NOTE: Information contained herein reflects review and annotations entered in association with encounter. Interpretation of such information and data should be left to medically-trained personnel. Information provided to patient can be located elsewhere in the medical record under "Patient Instructions". Document created using STT-dictation technology, any transcriptional errors that may result from process are unintentional.    Patient: Claire Mcdonald  Service Category: E/M  Provider: [name], MD  DOB: 1966-01-20  DOS: 01/19/2020  Referring Provider: [facility]  MRN: [id number]  Setting: Ambulatory outpatient  PCP: [facility]  Type: Established Patient  Specialty: Interventional Pain Management    Location: Office  Delivery: Face-to-face     Primary Reason(s) for Visit: Encounter for post-procedure evaluation of chronic illness with mild to moderate exacerbation CC: Back Pain (lumber bilateral )  HPI  Claire Mcdonald is a 54 y.o. year old, female patient, who comes today for a post-procedure evaluation. She has DDD (degenerative disc disease), lumbar; Lumbar facet arthropathy; Sacroiliac joint disease; Cardiac arrest; V. fib, [employer]. Jude ICD; Essential hypertension; Hyperlipidemia; Cough; Dyspnea; Deep vein thrombosis (DVT) ([place]); Normal coronary arteries; Obesity (BMI 35.0-39.9 without comorbidity); Pulmonary nodule seen on imaging study; Chronic anticoagulation (Xarelto); CAD (coronary artery disease); Seizure disorder ([place]); Heart failure ([place]); ICD (implantable cardioverter-defibrillator) lead failure; Ventricular tachycardia ([place]); Left-sided weakness; Anxiety; Asthma; Atypical squamous cell changes of undetermined significance (ASCUS) on cervical cytology with positive high risk human papilloma virus (HPV); Bilateral carpal tunnel syndrome; Diverticulosis of large intestine without hemorrhage; Food insecurity; Frequent falls; Gastroesophageal reflux disease without  esophagitis; Hernia, incisional; Hiatal hernia; History of deep venous thrombosis; Intramural leiomyoma of uterus; Ischemic stroke ([place]); Mental disorder follow-up; Migraine headache; Moderate episode of recurrent major depressive disorder ([place]); Right hip pain; 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[place]); Transient neurological symptoms; Urinary incontinence; Chronic low back pain (Primary Area of Pain) (Bilateral) (R>L); Chronic lower extremity pain (Secondary Area of Pain) (Bilateral) (R>L); Chronic pain syndrome; Long term current use of opiate analgesic; Pharmacologic therapy; Disorder of skeletal system; Problems influencing health status; Grade 2-3 (13-16 mm) Anterolisthesis of L5 over S1; Lumbar foraminal stenosis (L5-S1) (Bilateral); Chronic lumbar radicular pain (S1) (Bilateral); Chronic musculoskeletal pain; Neurogenic pain; Spinal instability of lumbosacral region (L5-S1); Lumbosacral radiculopathy at S1; Vitamin D deficiency; History of Allergy to iodine; History of allergy to radiographic contrast media; History of placement of internal cardiac defibrillator; Abdominal pain; Dysuria; Acute pancreatitis; Bradycardia; Bronchitis; Chronic allergic otitis media of left ear; Clostridium difficile colitis; Diarrhea; Diverticulitis; Dysfunction of both eustachian tubes; Eczema; Female pelvic pain; H/O: hysterectomy; Hematochezia; History of cardiac arrest; History of implantable cardioverter-defibrillator (ICD) insertion; Hypokalemia; Hypotension; Lower extremity edema; Lymphedema of both lower extremities; Mild persistent asthma without complication; Pain; Poor appetite; S/P ICD (internal cardiac defibrillator) procedure; Preoperative evaluation to rule out surgical contraindication; Pain in both lower extremities; Seizure ([place]); Difficulty sleeping; Restless leg syndrome; Chronic back pain; Lumbar facet syndrome (Bilateral) (R>L); Spondylosis without myelopathy or radiculopathy, lumbosacral  region; Bacterial vaginosis; Elevated amylase; Fall; History of CVA (cerebrovascular accident); Hx of cardiomyopathy; Memory loss; Onychomycosis; Other fatigue; Personal history of other venous thrombosis and embolism; S/P implantation of automatic cardioverter/defibrillator (AICD); UTI symptoms; Vertigo; Burning with urination; Other headache syndrome; Surgical wound breakdown; Pars defect of lumbar spine; Seasonal allergic rhinitis due to pollen; Acute cystitis without hematuria; TIA (transient ischemic attack); Myalgia; Pain in joint, lower leg; Cerebrovascular accident (CVA) due to embolism of left middle cerebral artery ([place]); Chronic pain of right knee; and Failed back surgical syndrome  on their problem list. Her primarily concern today is the Back Pain (lumber bilateral )  Pain Assessment: Location: Lower, Left, Right Back Radiating: into hips and legs bilaterally approx to the knees Onset: More than a month ago Duration: Chronic pain Quality: Discomfort, Throbbing, Aching, Constant Severity: 8 /10 (subjective, self-reported pain score)  Note: Reported level is compatible with observation.                         When using our objective Pain Scale, levels between 6 and 10/10 are said to belong in an emergency room, as it progressively worsens from a 6/10, described as severely limiting, requiring emergency care not usually available at an outpatient pain management facility. At a 6/10 level, communication becomes difficult and requires great effort. Assistance to reach the emergency department may be required. Facial flushing and profuse sweating along with potentially dangerous increases in heart rate and blood pressure will be evident. Effect on ADL: presents in wheelchair today.  difficult for her to get up and walk.  moving around too much aggravates the pain. Timing: Constant Modifying factors: procedure helped for approx 2 weeks. rest BP: (!) 135/100(patient states her PCP took her off of  her BP medication.)  HR: 88  Claire Mcdonald comes in today for post-procedure evaluation.  Today I went over my interpretation of the results and the proposed plan.  This plan would include trying a radiofrequency ablation on the right side first since this is her worst side.  If this works well, then will do the left side.  Because the patient has a significant amount of hardware in the lumbar spine, including 13 screws, I have pointed out to her that the likelihood of a successful outcome is not as high as if she did not have any hardware.  Should this fail, the next possible alternative would be to consider an intrathecal pump.  Further details on both, my assessment(s), as well as the proposed treatment plan, please see below.  Post-Procedure Assessment  01/05/2020 Procedure: Diagnostic bilateral lumbar facet block #2 under fluoroscopic guidance and IV sedation Pre-procedure pain score:  8/10 Post-procedure pain score: 0/10 (100% relief) Influential Factors: BMI: 33.47 kg/m Intra-procedural challenges: None observed.         Assessment challenges: None detected.              Reported side-effects: None.        Post-procedural adverse reactions or complications: None reported         Initial Analgesic Effects (1st post-procedure hour): 100 %            Sedation: Mild to moderate IV sedation provided for procedural analgesia and anxiety. When none is used, analgesia during this period is strictly due to the local anesthetic. When sedation is administered, analgesia may be a combination of the IV analgesic/anxiolytic, plus the effect of the local anesthetics used. Interpretative annotation: Clinically appropriate result. Analgesia during this period is likely to be Local Anesthetic and/or IV Sedative (Analgesic/Anxiolytic) related.          Persistent Analgesic Response (subsequent 4-6 hours post-procedure): 100 %            Analgesic effects during this period is associated to the localized  infiltration of local anesthetics and therefore caries significant diagnostic value as to the etiological location, or anatomical origin, of the pain. Expected duration of relief is directly dependent on the pharmacodynamics of the local anesthetic used. Long-acting (4-6  hours) anesthetics used.  Interpretative annotation: Clinically appropriate result. Analgesia during this period is likely to be Local Anesthetic-related.          Analgesic Response past initial 6 hours post-procedure: 100 %(pain returned on yesterday at the same level as prior to procedure.)           Defined as the period of time past the expected duration of local anesthetics (1 hour for short-acting and 4-6 hours for long-acting). With the possible exception of prolonged sympathetic blockade from the local anesthetics, benefits during this period are typically attributed to, or associated with, other factors such as analgesic sensory neuropraxia, antiinflammatory effects, or beneficial biochemical changes provided by agents other than the local anesthetics.  Interpretative annotation: Clinically possible results. Good relief. No permanent benefit expected. Inflammation plays a part in the etiology to the pain.          Current benefits: Defined as reported results that persistent at this point in time.   Analgesia: 75-100 % Claire Mcdonald reports improvement of axial and extremity symptoms. Function: Somewhat improved ROM: Somewhat improved Interpretative annotation: Recurrence of symptoms. No permanent benefit expected. Effective diagnostic intervention.          Interpretation: Results would suggest Claire Mcdonald to be a good candidate for Radiofrequency Ablation.                  Plan:  Proceed with Radiofrequency Ablation for the purpose of attaining long-term benefits.       "The patient has failed to respond to conservative therapies including over-the-counter medications, anti-inflammatories, muscle relaxants, membrane stabilizers,  opioids, physical therapy modalities such as heat and ice, as well as more invasive techniques such as nerve blocks. Because Claire Mcdonald did attain more than 90% relief of the pain during a series of diagnostic blocks conducted in separate occasions, I believe it is medically necessary to proceed with Radiofrequency Ablation, in order to attempt gaining longer relief.  Laboratory Chemistry Profile   Renal Lab Results  Component Value Date   BUN 21 (H) 04/05/2019   CREATININE 0.71 04/05/2019   BCR 21 04/16/2015   GFRAA >60 04/05/2019   GFRNONAA >60 04/05/2019   PROTEINUR NEGATIVE 04/05/2019     Electrolytes Lab Results  Component Value Date   NA 137 04/05/2019   K 4.3 04/05/2019   CL 101 04/05/2019   CALCIUM 9.7 04/05/2019   MG 2.2 11/30/2017   PHOS 3.8 02/19/2017     Hepatic Lab Results  Component Value Date   AST 17 03/27/2019   ALT 12 03/27/2019   ALBUMIN 4.0 03/27/2019   ALKPHOS 75 03/27/2019   LIPASE 29 03/27/2019     ID Lab Results  Component Value Date   STAPHAUREUS NEGATIVE 07/16/2015   MRSAPCR NEGATIVE 07/16/2015   PREGTESTUR NEGATIVE 07/20/2015     Bone Lab Results  Component Value Date   VD25OH 19.3 (L) 11/30/2017     Endocrine Lab Results  Component Value Date   GLUCOSE 164 (H) 04/05/2019   GLUCOSEU NEGATIVE 04/05/2019   HGBA1C 5.6 02/21/2017   TSH 1.284 02/19/2017     Neuropathy Lab Results  Component Value Date   VITAMINB12 239 11/30/2017   HGBA1C 5.6 02/21/2017     CNS No results found for: COLORCSF, APPEARCSF, RBCCOUNTCSF, WBCCSF, POLYSCSF, LYMPHSCSF, EOSCSF, PROTEINCSF, GLUCCSF, JCVIRUS, CSFOLI, IGGCSF, LABACHR, ACETBL, LABACHR, ACETBL   Inflammation (CRP: Acute  ESR: Chronic) Lab Results  Component Value Date   CRP <0.8 11/30/2017   ESRSEDRATE  19 11/30/2017   LATICACIDVEN 0.8 03/26/2015     Rheumatology No results found for: RF, ANA, LABURIC, URICUR, LYMEIGGIGMAB, LYMEABIGMQN, HLAB27   Coagulation Lab Results  Component Value  Date   INR 0.9 06/16/2019   LABPROT 12.3 06/16/2019   APTT 25 06/16/2019   PLT 317 06/16/2019   DDIMER 2.77 (H) 04/02/2015     Cardiovascular Lab Results  Component Value Date   BNP 20.8 09/21/2015   CKTOTAL 124 01/05/2013   CKMB < 0.5 (L) 01/05/2013   TROPONINI <0.03 02/19/2017   HGB 13.5 06/16/2019   HCT 41.8 06/16/2019     Screening Lab Results  Component Value Date   STAPHAUREUS NEGATIVE 07/16/2015   MRSAPCR NEGATIVE 07/16/2015   PREGTESTUR NEGATIVE 07/20/2015     Cancer No results found for: CEA, CA125, LABCA2   Allergens No results found for: ALMOND, APPLE, ASPARAGUS, AVOCADO, BANANA, BARLEY, BASIL, BAYLEAF, GREENBEAN, LIMABEAN, WHITEBEAN, BEEFIGE, REDBEET, BLUEBERRY, BROCCOLI, CABBAGE, MELON, CARROT, CASEIN, CASHEWNUT, CAULIFLOWER, CELERY     Note: Lab results reviewed.   Recent Imaging Results   Results for orders placed in visit on 12/31/17  DG C-Arm 1-60 Min-No Report   Narrative Fluoroscopy was utilized by the requesting physician.  No radiographic  interpretation.    Interpretation Report: Fluoroscopy was used during the procedure to assist with needle guidance. The images were interpreted intraoperatively by the requesting physician.        Meds   Current Outpatient Medications:  .  acetaminophen (TYLENOL) 500 MG tablet, Take by mouth., Disp: , Rfl:  .  albuterol (PROVENTIL HFA;VENTOLIN HFA) 108 (90 BASE) MCG/ACT inhaler, Inhale 2 puffs into the lungs every 6 (six) hours as needed for wheezing or shortness of breath., Disp: 1 Inhaler, Rfl: 0 .  atorvastatin (LIPITOR) 20 MG tablet, Take 1 tablet (20 mg total) by mouth daily at 6 PM. (Patient taking differently: Take 40 mg by mouth daily at 6 PM. ), Disp: 90 tablet, Rfl: 1 .  azelastine (ASTELIN) 0.1 % nasal spray, 1 spray by Each Nare route Two (2) times a day. Use in each nostril as directed, Disp: , Rfl:  .  Calcium Carbonate-Vit D-Min (GNP CALCIUM 1200) 1200-1000 MG-UNIT CHEW, Chew 1,200 mg by mouth  daily with breakfast. Take in combination with vitamin D and magnesium., Disp: 90 tablet, Rfl: 3 .  Cholecalciferol (VITAMIN D3) 125 MCG (5000 UT) TABS, Take by mouth., Disp: , Rfl:  .  famotidine (PEPCID) 20 MG tablet, Take 1 tablet (20 mg total) by mouth 2 (two) times daily., Disp: 60 tablet, Rfl: 1 .  furosemide (LASIX) 20 MG tablet, Take 20 mg by mouth daily and may take an additional daily as directed (Patient taking differently: 40 mg. Take 20 mg by mouth daily and may take an additional daily as directed), Disp: 90 tablet, Rfl: 3 .  gabapentin (NEURONTIN) 300 MG capsule, Take 300 mg by mouth daily., Disp: , Rfl:  .  hydrOXYzine (ATARAX/VISTARIL) 25 MG tablet, Take 25 mg by mouth as needed. , Disp: , Rfl:  .  levETIRAcetam (KEPPRA) 500 MG tablet, Take by mouth., Disp: , Rfl:  .  Magnesium 400 MG TABS, Take 400 mg by mouth daily., Disp: , Rfl:  .  metoprolol tartrate (LOPRESSOR) 25 MG tablet, Take 0.5 tablets (12.5 mg total) by mouth 2 (two) times daily. (Patient taking differently: Take 25 mg by mouth 2 (two) times daily. ), Disp: 60 tablet, Rfl: 1 .  promethazine (PHENERGAN) 12.5 MG tablet, Take by mouth., Disp: ,  Rfl:  .  rivaroxaban (XARELTO) 20 MG TABS tablet, Take 20 mg by mouth daily with supper., Disp: , Rfl:  .  topiramate (TOPAMAX) 100 MG tablet, Take 1 tablet (100 mg total) by mouth 2 (two) times daily., Disp: 60 tablet, Rfl: 1 .  triamcinolone (KENALOG) 0.025 % ointment, APPLY TOPICALLY TWICE DAILY AS NEEDED FOR ITCH. YOU CAN ALSO MIX WITHAQUAPHOROR VASELINE IN A 1:1 RATIO. AVOID FACE AND SKIN FOLDS, Disp: , Rfl:  .  cetirizine (ZYRTEC) 10 MG tablet, Take by mouth., Disp: , Rfl:  .  fluticasone (FLOVENT HFA) 110 MCG/ACT inhaler, Inhale into the lungs., Disp: , Rfl:   ROS  Constitutional: Denies any fever or chills Gastrointestinal: No reported hemesis, hematochezia, vomiting, or acute GI distress Musculoskeletal: Denies any acute onset joint swelling, redness, loss of ROM, or  weakness Neurological: No reported episodes of acute onset apraxia, aphasia, dysarthria, agnosia, amnesia, paralysis, loss of coordination, or loss of consciousness  Allergies  Claire Mcdonald is allergic to tape; iodinated diagnostic agents; toradol [ketorolac tromethamine]; tramadol; and zofran [ondansetron hcl].  PFSH  Drug: Claire Mcdonald  reports no history of drug use. Alcohol:  reports no history of alcohol use. Tobacco:  reports that she has never smoked. She has never used smokeless tobacco. 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 Surgical: Claire Mcdonald  has a past surgical history that includes Tubal ligation (1998); Ovarian cyst removal (Left); central line (03/25/2015); Hand surgery (Left); Cardiac catheterization (N/A, 03/27/2015); Cardiac catheterization (N/A, 04/11/2015); Cardiac catheterization (N/A, 07/16/2015); Colonoscopy with propofol (N/A, 10/01/2015); Esophagogastroduodenoscopy (egd) with propofol (N/A, 10/01/2015); and Abdominal hysterectomy. Family: family history includes Allergies in her daughter, son, and son; Asthma in her father and mother; Breast cancer in her maternal grandmother and paternal aunt; Cancer in her maternal grandmother; Depression in her mother; Diabetes in her father; Early death in her maternal grandmother and paternal grandmother; Heart disease in her son; Hyperlipidemia in her father and mother; Hypertension in her father and mother; Kidney disease in her father.  Constitutional Exam  General appearance: Well nourished, well developed, and well hydrated. In no apparent acute  distress Vitals:   01/19/20 1457 01/19/20 1505  BP: (!) 136/110 (!) 135/100  Pulse: 88   Resp: 16   Temp: 97.9 F (36.6 C)   TempSrc: Temporal   SpO2: 97%   Weight: 183 lb (83 kg)   Height: '5\' 2"'  (1.575 m)    BMI Assessment: Estimated body mass index is 33.47 kg/m as calculated from the following:   Height as of this encounter: '5\' 2"'  (1.575 m).   Weight as of this encounter: 183 lb (83 kg).  BMI interpretation table: BMI level Category Range association with higher incidence of chronic pain  <18 kg/m2 Underweight   18.5-24.9 kg/m2 Ideal body weight   25-29.9 kg/m2 Overweight Increased incidence by 20%  30-34.9 kg/m2 Obese (Class I) Increased incidence by 68%  35-39.9 kg/m2 Severe obesity (Class II) Increased incidence by 136%  >40 kg/m2 Extreme obesity (Class III) Increased incidence by 254%   Patient's current BMI Ideal Body weight  Body mass index is 33.47 kg/m. Ideal body weight: 50.1 kg (110 lb 7.2 oz) Adjusted ideal body weight: 63.3 kg (139 lb 7.5 oz)  BMI Readings from Last 4 Encounters:  01/19/20 33.47 kg/m  01/05/20 34.20 kg/m  06/23/19 29.26 kg/m  06/02/19 29.08 kg/m   Wt Readings from Last 4 Encounters:  01/19/20 183 lb (83 kg)  01/05/20 187 lb (84.8 kg)  06/23/19 160 lb (72.6 kg)  06/02/19 159 lb (72.1 kg)    Psych/Mental status: Alert, oriented x 3 (person, place, & time)       Eyes: PERLA Respiratory: No evidence of acute respiratory distress  Cervical Spine Exam  Skin & Axial Inspection: No masses, redness, edema, swelling, or associated skin lesions Alignment: Symmetrical Functional ROM: Unrestricted ROM      Stability: No instability detected Muscle Tone/Strength: Functionally intact. No obvious neuro-muscular anomalies detected. Sensory (Neurological): Unimpaired Palpation: No palpable anomalies              Upper Extremity (UE) Exam    Side: Right upper extremity  Side: Left upper extremity  Skin & Extremity Inspection: Skin color,  temperature, and hair growth are WNL. No peripheral edema or cyanosis. No masses, redness, swelling, asymmetry, or associated skin lesions. No contractures.  Skin & Extremity Inspection: Skin color, temperature, and hair growth are WNL. No peripheral edema or cyanosis. No masses, redness, swelling, asymmetry, or associated skin lesions. No contractures.  Functional ROM: Unrestricted ROM          Functional ROM: Unrestricted ROM          Muscle Tone/Strength: Functionally intact. No obvious neuro-muscular anomalies detected.  Muscle Tone/Strength: Functionally intact. No obvious neuro-muscular anomalies detected.  Sensory (Neurological): Unimpaired          Sensory (Neurological): Unimpaired          Palpation: No palpable anomalies              Palpation: No palpable anomalies              Provocative Test(s):  Phalen's test: deferred Tinel's test: deferred Apley's scratch test (touch opposite shoulder):  Action 1 (Across chest): deferred Action 2 (Overhead): deferred Action 3 (LB reach): deferred   Provocative Test(s):  Phalen's test: deferred Tinel's test: deferred Apley's scratch test (touch opposite shoulder):  Action 1 (Across chest): deferred Action 2 (Overhead): deferred Action 3 (LB reach): deferred    Thoracic Spine Area Exam  Skin & Axial Inspection: No masses, redness, or swelling Alignment: Symmetrical Functional ROM: Unrestricted ROM Stability: No instability detected Muscle Tone/Strength: Functionally intact. No obvious neuro-muscular anomalies detected. Sensory (Neurological): Unimpaired Muscle strength & Tone: No palpable anomalies  Lumbar Exam  Skin & Axial Inspection: No masses, redness, or swelling Alignment: Symmetrical Functional ROM: Unrestricted ROM       Stability: No instability detected Muscle Tone/Strength: Functionally intact. No obvious neuro-muscular anomalies detected. Sensory (Neurological): Unimpaired Palpation: No palpable anomalies        Provocative Tests: Hyperextension/rotation test: deferred today       Lumbar quadrant test (Kemp's test): deferred today       Lateral bending test: deferred today       Patrick's Maneuver: deferred today                   FABER* test: deferred today                   S-I anterior distraction/compression test: deferred today         S-I lateral compression test: deferred today         S-I Thigh-thrust test: deferred today  S-I Gaenslen's test: deferred today         *(Flexion, ABduction and External Rotation)  Gait & Posture Assessment  Ambulation: Unassisted Gait: Relatively normal for age and body habitus Posture: WNL   Lower Extremity Exam    Side: Right lower extremity  Side: Left lower extremity  Stability: No instability observed          Stability: No instability observed          Skin & Extremity Inspection: Skin color, temperature, and hair growth are WNL. No peripheral edema or cyanosis. No masses, redness, swelling, asymmetry, or associated skin lesions. No contractures.  Skin & Extremity Inspection: Skin color, temperature, and hair growth are WNL. No peripheral edema or cyanosis. No masses, redness, swelling, asymmetry, or associated skin lesions. No contractures.  Functional ROM: Unrestricted ROM                  Functional ROM: Unrestricted ROM                  Muscle Tone/Strength: Functionally intact. No obvious neuro-muscular anomalies detected.  Muscle Tone/Strength: Functionally intact. No obvious neuro-muscular anomalies detected.  Sensory (Neurological): Unimpaired        Sensory (Neurological): Unimpaired        DTR: Patellar: deferred today Achilles: deferred today Plantar: deferred today  DTR: Patellar: deferred today Achilles: deferred today Plantar: deferred today  Palpation: No palpable anomalies  Palpation: No palpable anomalies   Assessment   Status Diagnosis  Controlled Controlled Controlled 1. Chronic low back pain (Primary Area of  Pain) (Bilateral) (R>L)   2. Failed back surgical syndrome   3. Chronic lower extremity pain (Secondary Area of Pain) (Bilateral) (R>L)   4. DDD (degenerative disc disease), lumbar   5. 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 Lumbar facet syndrome (Bilateral) (R>L)   7. Chronic anticoagulation (Xarelto)      Updated Problems: Problem  Failed Back Surgical Syndrome    Plan of Care  Pharmacotherapy (Medications Ordered): No orders of the defined types were placed in this encounter.  Medications administered today: Claire Mcdonald had no medications administered during this visit.  Orders:  No orders of the defined types were placed in this encounter.  Lab Orders  No laboratory test(s) ordered today   Imaging Orders  No imaging studies ordered today   Referral Orders  No referral(s) requested today   Planned follow-up:   Return for RFA (w/ sedation): (R) L-FCT RFA #1, (Blood-thinner Protocol).      Interventional management options: Planned, scheduled, and/or pending:       Considering:   (Prior patient of Dr. Mohammed Kindle) NOTE: Xarelto Anticoagulation. (Stop: 3 days  Restart: 6 hrs) CONTRAST ALLERGY.  Diagnosticbilateral L5 TFESI #3  (HX of not following-up after procedures) Possiblebilateral lumbar facet RFA  Possible intrathecal trial for pump.   PRN Procedures:   Palliative bilateral lumbar facet block #3  Palliative bilateral L5 TFESI #3     Recent Visits Date Type Provider Dept  01/05/20 Procedure visit Milinda Pointer, MD [facility]  11/28/19 Office Visit Milinda Pointer, MD [facility]  Showing recent visits within past 90 days and meeting all other requirements   Today's Visits Date Type Provider Dept  01/19/20 Telemedicine Milinda Pointer, MD [facility]  Showing today's visits and meeting all other requirements   Future Appointments Date Type Provider Dept  02/07/20 Appointment Milinda Pointer, MD [facility]  Showing future appointments within next 90 days and meeting all other requirements   Primary Care Physician: [facility] Location: [facility] Note by: [name], MD Date: 01/19/2020; Time: 4:11 PM  Note: This dictation was prepared with Dragon dictation. Any transcriptional errors that may result from this process are unintentional.

## 2020-02-07 ENCOUNTER — Ambulatory Visit
Admission: RE | Admit: 2020-02-07 | Discharge: 2020-02-07 | Disposition: A | Discharge status: Home / Self Care | Payer: Medicaid Other | Source: Ambulatory Visit | Attending: Pain Medicine | Admitting: Pain Medicine

## 2020-02-07 ENCOUNTER — Other Ambulatory Visit: Payer: Self-pay

## 2020-02-07 ENCOUNTER — Ambulatory Visit (HOSPITAL_BASED_OUTPATIENT_CLINIC_OR_DEPARTMENT_OTHER): Payer: Medicaid Other | Admitting: Pain Medicine

## 2020-02-07 ENCOUNTER — Encounter: Payer: Self-pay | Admitting: Pain Medicine

## 2020-02-07 VITALS — BP 121/76 | HR 60 | Temp 97.2°F | Resp 14 | Ht 62.0 in | Wt 181.0 lb

## 2020-02-07 DIAGNOSIS — Z7901 Long term (current) use of anticoagulants: Secondary | ICD-10-CM

## 2020-02-07 DIAGNOSIS — M4310 Spondylolisthesis, site unspecified: Secondary | ICD-10-CM | POA: Diagnosis not present

## 2020-02-07 DIAGNOSIS — Z888 Allergy status to other drugs, medicaments and biological substances status: Secondary | ICD-10-CM | POA: Diagnosis present

## 2020-02-07 DIAGNOSIS — M47817 Spondylosis without myelopathy or radiculopathy, lumbosacral region: Secondary | ICD-10-CM | POA: Diagnosis present

## 2020-02-07 DIAGNOSIS — M5442 Lumbago with sciatica, left side: Secondary | ICD-10-CM

## 2020-02-07 DIAGNOSIS — M47816 Spondylosis without myelopathy or radiculopathy, lumbar region: Secondary | ICD-10-CM | POA: Diagnosis present

## 2020-02-07 DIAGNOSIS — M961 Postlaminectomy syndrome, not elsewhere classified: Secondary | ICD-10-CM

## 2020-02-07 DIAGNOSIS — G8929 Other chronic pain: Secondary | ICD-10-CM

## 2020-02-07 DIAGNOSIS — M5136 Other intervertebral disc degeneration, lumbar region: Secondary | ICD-10-CM | POA: Diagnosis not present

## 2020-02-07 DIAGNOSIS — G8918 Other acute postprocedural pain: Secondary | ICD-10-CM | POA: Insufficient documentation

## 2020-02-07 DIAGNOSIS — M5441 Lumbago with sciatica, right side: Secondary | ICD-10-CM | POA: Insufficient documentation

## 2020-02-07 DIAGNOSIS — Z91041 Radiographic dye allergy status: Secondary | ICD-10-CM | POA: Insufficient documentation

## 2020-02-07 DIAGNOSIS — M51369 Other intervertebral disc degeneration, lumbar region without mention of lumbar back pain or lower extremity pain: Secondary | ICD-10-CM

## 2020-02-07 MED ORDER — TRIAMCINOLONE ACETONIDE 40 MG/ML IJ SUSP
40.0000 mg | Freq: Once | INTRAMUSCULAR | Status: AC
  Administered 2020-02-07: 40 mg
  Filled 2020-02-07: qty 1

## 2020-02-07 MED ORDER — LACTATED RINGERS IV SOLN
1000.0000 mL | Freq: Once | INTRAVENOUS | Status: AC
  Administered 2020-02-07: 1000 mL via INTRAVENOUS

## 2020-02-07 MED ORDER — FENTANYL CITRATE (PF) 100 MCG/2ML IJ SOLN
25.0000 ug | INTRAMUSCULAR | Status: DC | PRN
  Administered 2020-02-07: 50 ug via INTRAVENOUS
  Filled 2020-02-07: qty 2

## 2020-02-07 MED ORDER — OXYCODONE-ACETAMINOPHEN 5-325 MG PO TABS: 1.0000 | ORAL_TABLET | Freq: Four times a day (QID) | ORAL | 0 refills | Status: AC | PRN

## 2020-02-07 MED ORDER — LIDOCAINE HCL 2 % IJ SOLN
20.0000 mL | Freq: Once | INTRAMUSCULAR | Status: AC
  Administered 2020-02-07: 400 mg
  Filled 2020-02-07: qty 20

## 2020-02-07 MED ORDER — MIDAZOLAM HCL 5 MG/5ML IJ SOLN
1.0000 mg | INTRAMUSCULAR | Status: DC | PRN
  Administered 2020-02-07: 1 mg via INTRAVENOUS
  Filled 2020-02-07: qty 5

## 2020-02-07 MED ORDER — ROPIVACAINE HCL 2 MG/ML IJ SOLN
9.0000 mL | Freq: Once | INTRAMUSCULAR | Status: AC
  Administered 2020-02-07: 9 mL via PERINEURAL
  Filled 2020-02-07: qty 10

## 2020-02-07 NOTE — Progress Notes (Signed)
PROVIDER NOTE: Information contained herein reflects review and annotations entered in association with encounter. Interpretation of such information and data should be left to medically-trained personnel. Information provided to patient can be located elsewhere in the medical record under "Patient Instructions". Document created using STT-dictation technology, any transcriptional errors that may result from process are unintentional.    Patient: Claire Mcdonald  Service Category: Procedure  Provider: [name], MD  DOB: Aug 15, 1966  DOS: 02/07/2020  Location: [facility]  MRN: [id number]  Setting: Ambulatory - outpatient  Referring Provider: Physicians, [facility]  Type: Established Patient  Specialty: Interventional Pain Management  PCP: Physicians, Early   Primary Reason for Visit: Interventional Pain Management Treatment. CC: Back Pain (right, lower)  Procedure:          Anesthesia, Analgesia, Anxiolysis:  Type: Thermal Lumbar Facet, Medial Branch Radiofrequency Ablation/Neurotomy  #1  Primary Purpose: Therapeutic Region: Posterolateral Lumbosacral Spine Level: L2, L3, L4, L5, & S1 Medial Branch Level(s). These levels will denervate the L3-4, L4-5, and the L5-S1 lumbar facet joints. Laterality: Right  Type: Moderate (Conscious) Sedation combined with Local Anesthesia Indication(s): Analgesia and Anxiety Route: Intravenous (IV) IV Access: Secured Sedation: Meaningful verbal contact was maintained at all times during the procedure  Local Anesthetic: Lidocaine 1-2%  Position: Prone   Indications: 1. Lumbar facet syndrome (Bilateral) (R>L)   2. Spondylosis without myelopathy or radiculopathy, lumbosacral region   3. Lumbar facet arthropathy   4. 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 Failed back surgical syndrome   6. DDD (degenerative disc disease), lumbar   7. Chronic low back pain (Primary Area of Pain) (Bilateral) (R>L)   8. Chronic  anticoagulation (Xarelto)   9. History of allergy to radiographic contrast media   10. 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 has been dealing with the above chronic pain for longer than three months and has either failed to respond, was unable to tolerate, or simply did not get enough benefit from other more conservative therapies including, but not limited to: 1. Over-the-counter medications 2. Anti-inflammatory medications 3. Muscle relaxants 4. Membrane stabilizers 5. Opioids 6. Physical therapy and/or chiropractic manipulation 7. Modalities (Heat, ice, etc.) 8. Invasive techniques such as nerve blocks. Ms. Stieber has attained more than 50% relief of the pain from a series of diagnostic injections conducted in separate occasions.  Pain Score: Pre-procedure: 8 /10 Post-procedure: 0-No pain/10  Pre-op Assessment:  Ms. Duquette is a 54 y.o. (year old), female patient, seen today for interventional treatment. She  has a past surgical history that includes Tubal ligation (1998); Ovarian cyst removal (Left); central line (03/25/2015); Hand surgery (Left); Cardiac catheterization (N/A, 03/27/2015); Cardiac catheterization (N/A, 04/11/2015); Cardiac catheterization (N/A, 07/16/2015); Colonoscopy with propofol (N/A, 10/01/2015); Esophagogastroduodenoscopy (egd) with propofol (N/A, 10/01/2015); and Abdominal hysterectomy. [name] has a current medication list which includes the following prescription(s): acetaminophen, albuterol, atorvastatin, azelastine, gnp calcium 1200, vitamin d3, famotidine, furosemide, gabapentin, hydroxyzine, magnesium, metoprolol tartrate, promethazine, rivaroxaban, topiramate, triamcinolone, cetirizine, fluticasone, levetiracetam, oxycodone-acetaminophen, and [START ON 02/14/2020] oxycodone-acetaminophen, and the following Facility-Administered Medications: fentanyl and midazolam. Her primarily concern today is the Back Pain (right, lower)  Initial Vital Signs:  Pulse/HCG Rate: 71ECG  Heart Rate: 63 Temp: (!) 97.5 F (36.4 C) Resp: 16 BP: (!) 135/100 SpO2: 97 %  BMI: Estimated body mass index is 33.11 kg/m as calculated from the following:   Height as of this encounter: 5\' 2"  (1.575 m).  Weight as of this encounter: 181 lb (82.1 kg).  Risk Assessment: Allergies: Reviewed. She is allergic to tape, iodinated diagnostic agents, toradol [ketorolac tromethamine], tramadol, and zofran [ondansetron hcl].  Allergy Precautions: None required Coagulopathies: Reviewed. None identified.  Blood-thinner therapy: None at this time Active Infection(s): Reviewed. None identified. Ms. Pandolfi is afebrile  Site Confirmation: Ms. Miles was asked to confirm the procedure and laterality before marking the site Procedure checklist: Completed Consent: Before the procedure and under the influence of no sedative(s), amnesic(s), or anxiolytics, the patient was informed of the treatment options, risks and possible complications. To fulfill our ethical and legal obligations, as recommended by the American Medical Association's Code of Ethics, I have informed the patient of my clinical impression; the nature and purpose of the treatment or procedure; the risks, benefits, and possible complications of the intervention; the alternatives, including doing nothing; the risk(s) and benefit(s) of the alternative treatment(s) or procedure(s); and the risk(s) and benefit(s) of doing nothing. The patient was provided information about the general risks and possible complications associated with the procedure. These may include, but are not limited to: failure to achieve desired goals, infection, bleeding, organ or nerve damage, allergic reactions, paralysis, and death. In addition, the patient was informed of those risks and complications associated to Spine-related procedures, such as failure to decrease pain; infection (i.e.: Meningitis, epidural or intraspinal abscess); bleeding (i.e.: epidural hematoma,  subarachnoid hemorrhage, or any other type of intraspinal or peri-dural bleeding); organ or nerve damage (i.e.: Any type of peripheral nerve, nerve root, or spinal cord injury) with subsequent damage to sensory, motor, and/or autonomic systems, resulting in permanent pain, numbness, and/or weakness of one or several areas of the body; allergic reactions; (i.e.: anaphylactic reaction); and/or death. Furthermore, the patient was informed of those risks and complications associated with the medications. These include, but are not limited to: allergic reactions (i.e.: anaphylactic or anaphylactoid reaction(s)); adrenal axis suppression; blood sugar elevation that in diabetics may result in ketoacidosis or comma; water retention that in patients with history of congestive heart failure may result in shortness of breath, pulmonary edema, and decompensation with resultant heart failure; weight gain; swelling or edema; medication-induced neural toxicity; particulate matter embolism and blood vessel occlusion with resultant organ, and/or nervous system infarction; and/or aseptic necrosis of one or more joints. Finally, the patient was informed that Medicine is not an exact science; therefore, there is also the possibility of unforeseen or unpredictable risks and/or possible complications that may result in a catastrophic outcome. The patient indicated having understood very clearly. We have given the patient no guarantees and we have made no promises. Enough time was given to the patient to ask questions, all of which were answered to the patient's satisfaction. Ms. Pincus has indicated that she wanted to continue with the procedure. Attestation: I, the ordering provider, attest that I have discussed with the patient the benefits, risks, side-effects, alternatives, likelihood of achieving goals, and potential problems during recovery for the procedure that I have provided informed consent. Date   Time: 02/07/2020 12:50  PM  Pre-Procedure Preparation:  Monitoring: As per clinic protocol. Respiration, ETCO2, SpO2, BP, heart rate and rhythm monitor placed and checked for adequate function Safety Precautions: Patient was assessed for positional comfort and pressure points before starting the procedure. Time-out: I initiated and conducted the "Time-out" before starting the procedure, as per protocol. The patient was asked to participate by confirming the accuracy of the "Time Out" information. Verification of the correct person, site, and  procedure were performed and confirmed by me, the nursing staff, and the patient. "Time-out" conducted as per Joint Commission's Universal Protocol (UP.01.01.01). Time: 1404  Description of Procedure:          Laterality: Right Levels:  L2, L3, L4, L5, & S1 Medial Branch Level(s), at the L3-4, L4-5, and the L5-S1 lumbar facet joints. Area Prepped: Lumbosacral DuraPrep (Iodine Povacrylex [0.7% available iodine] and Isopropyl Alcohol, 74% w/w) Safety Precautions: Aspiration looking for blood return was conducted prior to all injections. At no point did we inject any substances, as a needle was being advanced. Before injecting, the patient was told to immediately notify me if she was experiencing any new onset of "ringing in the ears, or metallic taste in the mouth". No attempts were made at seeking any paresthesias. Safe injection practices and needle disposal techniques used. Medications properly checked for expiration dates. SDV (single dose vial) medications used. After the completion of the procedure, all disposable equipment used was discarded in the proper designated medical waste containers. Local Anesthesia: Protocol guidelines were followed. The patient was positioned over the fluoroscopy table. The area was prepped in the usual manner. The time-out was completed. The target area was identified using fluoroscopy. A 12-in long, straight, sterile hemostat was used with fluoroscopic  guidance to locate the targets for each level blocked. Once located, the skin was marked with an approved surgical skin marker. Once all sites were marked, the skin (epidermis, dermis, and hypodermis), as well as deeper tissues (fat, connective tissue and muscle) were infiltrated with a small amount of a short-acting local anesthetic, loaded on a 10cc syringe with a 25G, 1.5-in  Needle. An appropriate amount of time was allowed for local anesthetics to take effect before proceeding to the next step. Local Anesthetic: Lidocaine 2.0% The unused portion of the local anesthetic was discarded in the proper designated containers. Technical explanation of process:  Radiofrequency Ablation (RFA) L2 Medial Branch Nerve RFA: The target area for the L2 medial branch is at the junction of the postero-lateral aspect of the superior articular process and the superior, posterior, and medial edge of the transverse process of L3. Under fluoroscopic guidance, a Radiofrequency needle was inserted until contact was made with os over the superior postero-lateral aspect of the pedicular shadow (target area). Sensory and motor testing was conducted to properly adjust the position of the needle. Once satisfactory placement of the needle was achieved, the numbing solution was slowly injected after negative aspiration for blood. 2.0 mL of the nerve block solution was injected without difficulty or complication. After waiting for at least 3 minutes, the ablation was performed. Once completed, the needle was removed intact. L3 Medial Branch Nerve RFA: The target area for the L3 medial branch is at the junction of the postero-lateral aspect of the superior articular process and the superior, posterior, and medial edge of the transverse process of L4. Under fluoroscopic guidance, a Radiofrequency needle was inserted until contact was made with os over the superior postero-lateral aspect of the pedicular shadow (target area). Sensory and  motor testing was conducted to properly adjust the position of the needle. Once satisfactory placement of the needle was achieved, the numbing solution was slowly injected after negative aspiration for blood. 2.0 mL of the nerve block solution was injected without difficulty or complication. After waiting for at least 3 minutes, the ablation was performed. Once completed, the needle was removed intact. L4 Medial Branch Nerve RFA: The target area for the L4 medial branch is  at the junction of the postero-lateral aspect of the superior articular process and the superior, posterior, and medial edge of the transverse process of L5. Under fluoroscopic guidance, a Radiofrequency needle was inserted until contact was made with os over the superior postero-lateral aspect of the pedicular shadow (target area). Sensory and motor testing was conducted to properly adjust the position of the needle. Once satisfactory placement of the needle was achieved, the numbing solution was slowly injected after negative aspiration for blood. 2.0 mL of the nerve block solution was injected without difficulty or complication. After waiting for at least 3 minutes, the ablation was performed. Once completed, the needle was removed intact. L5 Medial Branch Nerve RFA: The target area for the L5 medial branch is at the junction of the postero-lateral aspect of the superior articular process of S1 and the superior, posterior, and medial edge of the sacral ala. Under fluoroscopic guidance, a Radiofrequency needle was inserted until contact was made with os over the superior postero-lateral aspect of the pedicular shadow (target area). Sensory and motor testing was conducted to properly adjust the position of the needle. Once satisfactory placement of the needle was achieved, the numbing solution was slowly injected after negative aspiration for blood. 2.0 mL of the nerve block solution was injected without difficulty or complication. After  waiting for at least 3 minutes, the ablation was performed. Once completed, the needle was removed intact. S1 Medial Branch Nerve RFA: The target area for the S1 medial branch is located inferior to the junction of the S1 superior articular process and the L5 inferior articular process, posterior, inferior, and lateral to the 6 o'clock position of the L5-S1 facet joint, just superior to the S1 posterior foramen. Under fluoroscopic guidance, the Radiofrequency needle was advanced until contact was made with os over the Target area. Sensory and motor testing was conducted to properly adjust the position of the needle. Once satisfactory placement of the needle was achieved, the numbing solution was slowly injected after negative aspiration for blood. 2.0 mL of the nerve block solution was injected without difficulty or complication. After waiting for at least 3 minutes, the ablation was performed. Once completed, the needle was removed intact. Radiofrequency lesioning (ablation):  Radiofrequency Generator: NeuroTherm [id number] Sensory Stimulation Parameters: 50 Hz was used to locate & identify the nerve, making sure that the needle was positioned such that there was no sensory stimulation below 0.3 V or above 0.7 V. Motor Stimulation Parameters: 2 Hz was used to evaluate the motor component. Care was taken not to lesion any nerves that demonstrated motor stimulation of the lower extremities at an output of less than 2.5 times that of the sensory threshold, or a maximum of 2.0 V. Lesioning Technique Parameters: Standard Radiofrequency settings. (Not bipolar or pulsed.) Temperature Settings: 80 degrees C Lesioning time: 60 seconds Intra-operative Compliance: Compliant Materials & Medications: Needle(s) (Electrode/Cannula) Type: Teflon-coated, curved tip, Radiofrequency needle(s) Gauge: 20G Length: 15cm Numbing solution: 0.2% PF-Ropivacaine + Triamcinolone (40 mg/mL) diluted to a final concentration of 4 mg of  Triamcinolone/mL of Ropivacaine The unused portion of the solution was discarded in the proper designated containers.  Once the entire procedure was completed, the treated area was cleaned, making sure to leave some of the prepping solution back to take advantage of its long term bactericidal properties.  Illustration of the posterior view of the lumbar spine and the posterior neural structures. Laminae of L2 through S1 are labeled. DPRL5, dorsal primary ramus of L5; DPRS1, dorsal primary ramus  of S1; DPR3, dorsal primary ramus of L3; FJ, facet (zygapophyseal) joint L3-L4; I, inferior articular process of L4; LB1, lateral branch of dorsal primary ramus of L1; IAB, inferior articular branches from L3 medial branch (supplies L4-L5 facet joint); IBP, intermediate branch plexus; MB3, medial branch of dorsal primary ramus of L3; NR3, third lumbar nerve root; S, superior articular process of L5; SAB, superior articular branches from L4 (supplies L4-5 facet joint also); TP3, transverse process of L3.  Vitals:   02/07/20 1445 02/07/20 1453 02/07/20 1503 02/07/20 1514  BP: (!) 121/99 (!) 128/95 (!) 119/95 121/76  Pulse: 60     Resp: 12 14 16 14   Temp:  (!) 97.1 F (36.2 C)  (!) 97.2 F (36.2 C)  TempSrc:      SpO2: 99% 99% 100% 100%  Weight:      Height:       Start Time: 1404 hrs. End Time: 1445 hrs.  Imaging Guidance (Spinal):          Type of Imaging Technique: Fluoroscopy Guidance (Spinal) Indication(s): Assistance in needle guidance and placement for procedures requiring needle placement in or near specific anatomical locations not easily accessible without such assistance. Exposure Time: Please see nurses notes. Contrast: None used. Fluoroscopic Guidance: I was personally present during the use of fluoroscopy. "Tunnel Vision Technique" used to obtain the best possible view of the target area. Parallax error corrected before commencing the procedure. "Direction-depth-direction" technique used  to introduce the needle under continuous pulsed fluoroscopy. Once target was reached, antero-posterior, oblique, and lateral fluoroscopic projection used confirm needle placement in all planes. Images permanently stored in EMR. Interpretation: No contrast injected. I personally interpreted the imaging intraoperatively. Adequate needle placement confirmed in multiple planes. Permanent images saved into the patient's record.  Antibiotic Prophylaxis:   Anti-infectives (From admission, onward)   None     Indication(s): None identified  Post-operative Assessment:  Post-procedure Vital Signs:  Pulse/HCG Rate: 6062 Temp: (!) 97.2 F (36.2 C) Resp: 14 BP: 121/76 SpO2: 100 %  EBL: None  Complications: No immediate post-treatment complications observed by team, or reported by patient.  Note: The patient tolerated the entire procedure well. A repeat set of vitals were taken after the procedure and the patient was kept under observation following institutional policy, for this type of procedure. Post-procedural neurological assessment was performed, showing return to baseline, prior to discharge. The patient was provided with post-procedure discharge instructions, including a section on how to identify potential problems. Should any problems arise concerning this procedure, the patient was given instructions to immediately contact us, at any time, without hesitation. In any case, we plan to contact the patient by telephone for a follow-up status report regarding this interventional procedure.  Comments:  No additional relevant information.  Plan of Care  Orders:  Orders Placed This Encounter  Procedures   Radiofrequency,Lumbar    Scheduling Instructions:     Side(s): Right-sided     Level: L3-4, L4-5, & L5-S1 Facets (L2, L3, L4, L5, & S1 Medial Branch Nerves)     Sedation: With Sedation.     Timeframe: Today    Order Specific Question:   Where will this procedure be performed?    Answer:    [facility]   DG PAIN [facility] C-ARM 1-60 MIN NO REPORT    Intraoperative interpretation by procedural physician at [facility].    Standing Status:   Standing    Number of Occurrences:   1    Order  Specific Question:   Reason for exam:    Answer:   Assistance in needle guidance and placement for procedures requiring needle placement in or near specific anatomical locations not easily accessible without such assistance.   Informed Consent Details: Physician/Practitioner Attestation; Transcribe to consent form and obtain patient signature    Nursing Order: Transcribe to consent form and obtain patient signature. Note: Always confirm laterality of pain with Ms. Bellina, before procedure. Procedure: Lumbar Facet Radiofrequency Ablation Indication/Reason: Low Back Pain, with our without leg pain, due to Facet Joint Arthralgia (Joint Pain) known as Lumbar Facet Syndrome, secondary to Lumbar, and/or Lumbosacral Spondylosis (Arthritis of the Spine), without myelopathy or radiculopathy (Nerve Damage). Provider Attestation: I, [place] Dossie Arbour, MD, (Pain Management Specialist), the physician/practitioner, attest that I have discussed with the patient the benefits, risks, side effects, alternatives, likelihood of achieving goals and potential problems during recovery for the procedure that I have provided informed consent.   Care order/instruction: Please confirm that the patient has stopped the Xarelto (Rivaroxaban) x 3 days prior to procedure or surgery.    Please confirm that the patient has stopped the Xarelto (Rivaroxaban) x 3 days prior to procedure or surgery.    Standing Status:   Standing    Number of Occurrences:   1   Provide equipment / supplies at bedside    Equipment required: Sterile "Radiofrequency Tray"; Large hemostat (1); Small hemostat (1); Towels (6-8); 4x4 sterile sponge pack (1) Radiofrequency Needle(s): Size: Long Quantity: 5    Standing Status:   Standing      Number of Occurrences:   1    Order Specific Question:   Specify    Answer:   Radiofrequency Tray   Bleeding precautions    Standing Status:   Standing    Number of Occurrences:   1   Miscellanous precautions    Standing Status:   Standing    Number of Occurrences:   1   Miscellanous precautions    NOTE: Although It is true that patients can have allergies to shellfish and that shellfish contain iodine, most shellfish  allergies are due to two protein allergens present in the shellfish: tropomyosins and parvalbumin. Not all patients with shellfish allergies are allergic to iodine. However, as a precaution, avoid using iodine containing products.    Standing Status:   Standing    Number of Occurrences:   1   Chronic Opioid Analgesic:  No opioid analgesics prescribed by our practice.   Medications ordered for procedure: Meds ordered this encounter  Medications   lidocaine (XYLOCAINE) 2 % (with pres) injection 400 mg   lactated ringers infusion 1,000 mL   midazolam (VERSED) 5 MG/5ML injection 1-2 mg    Make sure Flumazenil is available in the pyxis when using this medication. If oversedation occurs, administer 0.2 mg IV over 15 sec. If after 45 sec no response, administer 0.2 mg again over 1 min; may repeat at 1 min intervals; not to exceed 4 doses (1 mg)   fentaNYL (SUBLIMAZE) injection 25-50 mcg    Make sure Narcan is available in the pyxis when using this medication. In the event of respiratory depression (RR< 8/min): Titrate NARCAN (naloxone) in increments of 0.1 to 0.2 mg IV at 2-3 minute intervals, until desired degree of reversal.   triamcinolone acetonide (KENALOG-40) injection 40 mg   ropivacaine (PF) 2 mg/mL (0.2%) (NAROPIN) injection 9 mL   oxyCODONE-acetaminophen (PERCOCET) 5-325 MG tablet    Sig: Take 1 tablet by mouth every  6 (six) hours as needed for up to 7 days for severe pain. Must last 7 days.    Dispense:  28 tablet    Refill:  0    For acute  post-operative pain. Not to be refilled. Most last 7 days.   oxyCODONE-acetaminophen (PERCOCET) 5-325 MG tablet    Sig: Take 1 tablet by mouth every 6 (six) hours as needed for up to 7 days for severe pain. Must last 7 days.    Dispense:  28 tablet    Refill:  0    For acute post-operative pain. Not to be refilled. Must last 7 days.   Medications administered: We administered lidocaine, lactated ringers, midazolam, fentaNYL, triamcinolone acetonide, and ropivacaine (PF) 2 mg/mL (0.2%).  See the medical record for exact dosing, route, and time of administration.  Follow-up plan:   Return in about 2 weeks (around 02/21/2020) for RFA (w/ sedation): (L) L-FCT RFA #1.       Interventional management options: Planned, scheduled, and/or pending:       Considering:   (Prior patient of Dr. Mohammed Kindle) NOTE: Xarelto Anticoagulation. (Stop: 3 days   Restart: 6 hrs) CONTRAST ALLERGY.  Diagnosticbilateral L5 TFESI #3  (HX of not following-up after procedures) Possiblebilateral lumbar facet RFA  Possible intrathecal trial for pump.   PRN Procedures:   Palliative bilateral lumbar facet block #3  Palliative bilateral L5 TFESI #3      Recent Visits Date Type Provider Dept  01/19/20 Telemedicine Milinda Pointer, MD [facility]  01/05/20 Procedure visit Milinda Pointer, MD [facility]  11/28/19 Office Visit Milinda Pointer, MD [facility]  Showing recent visits within past 90 days and meeting all other requirements Today's Visits Date Type Provider Dept  02/07/20 Procedure visit Milinda Pointer, MD [facility]  Showing today's visits and meeting all other requirements Future Appointments Date Type Provider Dept  02/28/20 Appointment Milinda Pointer, MD [facility]  Showing future appointments within next 90 days and meeting all other requirements  Disposition: Discharge home  Discharge (Date   Time): 02/07/2020; 1516 hrs.     Primary Care Physician: [facility] Location: [facility] Note by: [name], MD Date: 02/07/2020; Time: 3:38 PM  Disclaimer:  Medicine is not an Chief Strategy Officer. The only guarantee in medicine is that nothing is guaranteed. It is important to note that the decision to proceed with this intervention was based on the information collected from the patient. The Data and conclusions were drawn from the patient's questionnaire, the interview, and the physical examination. Because the information was provided in large part by the patient, it cannot be guaranteed that it has not been purposely or unconsciously manipulated. Every effort has been made to obtain as much relevant data as possible for this evaluation. It is important to note that the conclusions that lead to this procedure are derived in large part from the available data. Always take into account that the treatment will also be dependent on availability of resources and existing treatment guidelines, considered by other Pain Management Practitioners as being common knowledge and practice, at the time of the intervention. For Medico-Legal purposes, it is also important to point out that variation in procedural techniques and pharmacological choices are the acceptable norm. The indications, contraindications, technique, and results of the above procedure should only be interpreted and judged by a Board-Certified Interventional Pain Specialist with extensive familiarity and expertise in the same exact procedure and technique.

## 2020-02-07 NOTE — Patient Instructions (Addendum)
___________________________________________________________________________________________  Post-Radiofrequency (RF) Discharge Instructions  You have just completed a Radiofrequency Neurotomy.  The following instructions will provide you with information and guidelines for self-care upon discharge.  If at any time you have questions or concerns please call your physician. DO NOT DRIVE YOURSELF!!  Instructions:  Apply ice: Fill a plastic sandwich bag with crushed ice. Cover it with a small towel and apply to injection site. Apply for 15 minutes then remove x 15 minutes. Repeat sequence on day of procedure, until you go to bed. The purpose is to minimize swelling and discomfort after procedure.  Apply heat: Apply heat to procedure site starting the day following the procedure. The purpose is to treat any soreness and discomfort from the procedure.  Food intake: No eating limitations, unless stipulated above.  Nevertheless, if you have had sedation, you may experience some nausea.  In this case, it may be wise to wait at least two hours prior to resuming regular diet.  Physical activities: Keep activities to a minimum for the first 8 hours after the procedure. For the first 24 hours after the procedure, do not drive a motor vehicle,  Operate heavy machinery, power tools, or handle any weapons.  Consider walking with the use of an assistive device or accompanied by an adult for the first 24 hours.  Do not drink alcoholic beverages including beer.  Do not make any important decisions or sign any legal documents. Go home and rest today.  Resume activities tomorrow, as tolerated.  Use caution in moving about as you may experience mild leg weakness.  Use caution in cooking, use of household electrical appliances and climbing steps.  Driving: If you have received any sedation, you are not allowed to drive for 24 hours after your procedure.  Blood thinner: Restart your blood thinner 6 hours after your  procedure. (Only for those taking blood thinners)  Insulin: As soon as you can eat, you may resume your normal dosing schedule. (Only for those taking insulin)  Medications: May resume pre-procedure medications.  Do not take any drugs, other than what has been prescribed to you.  Infection prevention: Keep procedure site clean and dry.  Post-procedure Pain Diary: Extremely important that this be done correctly and accurately. Recorded information will be used to determine the next step in treatment.  Pain evaluated is that of treated area only. Do not include pain from an untreated area.  Complete every hour, on the hour, for the initial 8 hours. Set an alarm to help you do this part accurately.  Do not go to sleep and have it completed later. It will not be accurate.  Follow-up appointment: Keep your follow-up appointment after the procedure. Usually 2-6 weeks after radiofrequency. Bring you pain diary. The information collected will be essential for your long-term care.   Expect:  From numbing medicine (AKA: Local Anesthetics): Numbness or decrease in pain.  Onset: Full effect within 15 minutes of injected.  Duration: It will depend on the type of local anesthetic used. On the average, 1 to 8 hours.   From steroids (when added): Decrease in swelling or inflammation. Once inflammation is improved, relief of the pain will follow.  Onset of benefits: Depends on the amount of swelling present. The more swelling, the longer it will take for the benefits to be seen. In some cases, up to 10 days.  Duration: Steroids will stay in the system x 2 weeks. Duration of benefits will depend on multiple posibilities including persistent irritating factors.    From procedure: Some discomfort is to be expected once the numbing medicine wears off. In the case of radiofrequency procedures, this may last as long as 6 weeks. Additional post-procedure pain medication is provided for this. Discomfort is  minimized if ice and heat are applied as instructed.  Call if:  You experience numbness and weakness that gets worse with time, as opposed to wearing off.  He experience any unusual bleeding, difficulty breathing, or loss of the ability to control your bowel and bladder. (This applies to Spinal procedures only)  You experience any redness, swelling, heat, red streaks, elevated temperature, fever, or any other signs of a possible infection.  Emergency Numbers:  Durning business hours (Monday - Thursday, 8:00 AM - 4:00 PM) (Friday, 9:00 AM - 12:00 Noon): [phone]  After hours: [phone] ____________________________________________________________________________________________   Post-procedure Information What to expect: Most procedures involve the use of a local anesthetic (numbing medicine), and a steroid (anti-inflammatory medicine).  The local anesthetics may cause temporary numbness and weakness of the legs or arms, depending on the location of the block. This numbness/weakness may last 4-6 hours, depending on the local anesthetic used. In rare instances, it can last up to 24 hours. While numb, you must be very careful not to injure the extremity.  After any procedure, you could expect the pain to get better within 15-20 minutes. This relief is temporary and may last 4-6 hours. Once the local anesthetics wears off, you could experience discomfort, possibly more than usual, for up to 10 (ten) days. In the case of radiofrequencies, it may last up to 6 weeks. Surgeries may take up to 8 weeks for the healing process. The discomfort is due to the irritation caused by needles going through skin and muscle. To minimize the discomfort, we recommend using ice the first day, and heat from then on. The ice should be applied for 15 minutes on, and 15 minutes off. Keep repeating this cycle until bedtime. Avoid applying the ice directly to the skin, to prevent frostbite. Heat should be used  daily, until the pain improves (4-10 days). Be careful not to burn yourself.  Occasionally you may experience muscle spasms or cramps. These occur as a consequence of the irritation caused by the needle sticks to the muscle and the blood that will inevitably be lost into the surrounding muscle tissue. Blood tends to be very irritating to tissues, which tend to react by going into spasm. These spasms may start the same day of your procedure, but they may also take days to develop. This late onset type of spasm or cramp is usually caused by electrolyte imbalances triggered by the steroids, at the level of the kidney. Cramps and spasms tend to respond well to muscle relaxants, multivitamins (some are triggered by the procedure, but may have their origins in vitamin deficiencies), and "Gatorade", or any sports drinks that can replenish any electrolyte imbalances. (If you are a diabetic, ask your pharmacist to get you a sugar-free brand.) Warm showers or baths may also be helpful. Stretching exercises are highly recommended. General Instructions:  Be alert for signs of possible infection: redness, swelling, heat, red streaks, elevated temperature, and/or fever. These typically appear 4 to 6 days after the procedure. Immediately notify your doctor if you experience unusual bleeding, difficulty breathing, or loss of bowel or bladder control. If you experience increased pain, do not increase your pain medicine intake, unless instructed by your pain physician. Post-Procedure Care:  Be careful in moving about. Muscle   spasms in the area of the injection may occur. Applying ice or heat to the area is often helpful. The incidence of spinal headaches after epidural injections ranges between 1.4% and 6%. If you develop a headache that does not seem to respond to conservative therapy, please let your physician know. This can be treated with an epidural blood patch.   Post-procedure numbness or redness is to be expected,  however it should average 4 to 6 hours. If numbness and weakness of your extremities begins to develop 4 to 6 hours after your procedure, and is felt to be progressing and worsening, immediately contact your physician.   Diet:  If you experience nausea, do not eat until this sensation goes away. If you had a "Stellate Ganglion Block" for upper extremity "Reflex Sympathetic Dystrophy", do not eat or drink until your hoarseness goes away. In any case, always start with liquids first and if you tolerate them well, then slowly progress to more solid foods. Activity:  For the first 4 to 6 hours after the procedure, use caution in moving about as you may experience numbness and/or weakness. Use caution in cooking, using household electrical appliances, and climbing steps. If you need to reach your Doctor call our office: [phone] Monday-Thursday 8:00 am - 4:00 PM    Fridays: Closed     In case of an emergency: In case of emergency, call 911 or go to the nearest emergency room and have the physician there call us.  Interpretation of Procedure Every nerve block has two components: a diagnostic component, and a treatment component. Unrealistic expectations are the most common causes of "perceived failure".  In a perfect world, a single nerve block should be able to completely and permanently eliminate the pain. Sadly, the world is not perfect.  Most pain management nerve blocks are performed using local anesthetics and steroids. Steroids are responsible for any long-term benefit that you may experience. Their purpose is to decrease any chronic swelling that may exist in the area. Steroids begin to work immediately after being injected. However, most patients will not experience any benefits until 5 to 10 days after the injection, when the swelling has come down to the point where they can tell a difference. Steroids will only help if there is swelling to be treated. As such, they can assist with the  diagnosis. If effective, they suggest an inflammatory component to the pain, and if ineffective, they rule out inflammation as the main cause or component of the problem. If the problem is one of mechanical compression, you will get no benefit from those steroids.   In the case of local anesthetics, they have a crucial role in the diagnosis of your condition. Most will begin to work within15 to 20 minutes after injection. The duration will depend on the type used (short- vs. Long-acting). It is of outmost importance that patients keep tract of their pain, after the procedure. To assist with this matter, a "Post-procedure Pain Diary" is provided. Make sure to complete it and to bring it back to your follow-up appointment.  As long as the patient keeps accurate, detailed records of their symptoms after every procedure, and returns to have those interpreted, every procedure will provide us with invaluable information. Even a block that does not provide the patient with any relief, will always provide us with information about the mechanism and the origin of the pain. The only time a nerve block can be considered a waste of time is when patients   do not keep track of the results, or do not keep their post-procedure appointment.  Reporting the results back to your physician The Pain Score  Pain is a subjective complaint. It cannot be seen, touched, or measured. We depend entirely on the patient's report of the pain in order to assess your condition and treatment. To evaluate the pain, we use a pain scale, where "0" means "No Pain", and a "10" is "the worst possible pain that you can even imagine" (i.e. something like been eaten alive by a shark or being torn apart by a lion).   You will frequently be asked to rate your pain. Please be as accurate, remember that medical decisions will be based on your responses. Please do not rate your pain above a 10. Doing so is actually interpreted as "symptom magnification"  (exaggeration), as well as lack of understanding with regards to the scale. To put this into perspective, when you tell us that your pain is at a 10 (ten), what you are saying is that there is nothing we can do to make this pain any worse. (Carefully think about that.)  

## 2020-02-07 NOTE — Progress Notes (Signed)
Safety precautions to be maintained throughout the outpatient stay will include: orient to surroundings, keep bed in low position, maintain call bell within reach at all times, provide assistance with transfer out of bed and ambulation.  

## 2020-02-08 ENCOUNTER — Telehealth: Payer: Self-pay | Admitting: *Deleted

## 2020-02-08 NOTE — Telephone Encounter (Signed)
No problems post procedure.

## 2020-02-14 ENCOUNTER — Telehealth: Payer: Self-pay

## 2020-02-14 NOTE — Telephone Encounter (Signed)
She had the RFA last week and is saying that it didn't help and she doubts she will get the other side done. I tried to explain that it would take several weeks for the RFA to benefit her but she is saying that she just wants the pump. I told her I would put a note back and get a nurse to call her and explain this better. Please see if she wants to keep the other RFA on the schedule.

## 2020-02-14 NOTE — Telephone Encounter (Signed)
Voicemail left with patient, calling to discuss RFA results and patient request for a pump. Asked her to please call us back so that we further discuss.   Talked to patient and she has thought about her discussion with [employer] and has decided that she will have the RFA performed on the Left side and will talk to Dr Dossie Arbour more about being referred for pump omplant.

## 2020-02-25 ENCOUNTER — Other Ambulatory Visit: Payer: Self-pay

## 2020-02-25 ENCOUNTER — Emergency Department: Payer: Medicaid Other

## 2020-02-25 ENCOUNTER — Inpatient Hospital Stay
Admission: EM | Admit: 2020-02-25 | Discharge: 2020-03-01 | DRG: 522 | Disposition: A | Discharge status: Home / Self Care | Payer: Medicaid Other | Attending: Internal Medicine | Admitting: Internal Medicine

## 2020-02-25 DIAGNOSIS — M545 Low back pain: Secondary | ICD-10-CM | POA: Diagnosis present

## 2020-02-25 DIAGNOSIS — I5032 Chronic diastolic (congestive) heart failure: Secondary | ICD-10-CM | POA: Diagnosis present

## 2020-02-25 DIAGNOSIS — Z83438 Family history of other disorder of lipoprotein metabolism and other lipidemia: Secondary | ICD-10-CM

## 2020-02-25 DIAGNOSIS — K219 Gastro-esophageal reflux disease without esophagitis: Secondary | ICD-10-CM | POA: Diagnosis present

## 2020-02-25 DIAGNOSIS — Z841 Family history of disorders of kidney and ureter: Secondary | ICD-10-CM

## 2020-02-25 DIAGNOSIS — Y92009 Unspecified place in unspecified non-institutional (private) residence as the place of occurrence of the external cause: Secondary | ICD-10-CM | POA: Diagnosis not present

## 2020-02-25 DIAGNOSIS — G40909 Epilepsy, unspecified, not intractable, without status epilepticus: Secondary | ICD-10-CM | POA: Diagnosis present

## 2020-02-25 DIAGNOSIS — S72011A Unspecified intracapsular fracture of right femur, initial encounter for closed fracture: Secondary | ICD-10-CM | POA: Diagnosis present

## 2020-02-25 DIAGNOSIS — I82409 Acute embolism and thrombosis of unspecified deep veins of unspecified lower extremity: Secondary | ICD-10-CM | POA: Diagnosis present

## 2020-02-25 DIAGNOSIS — F329 Major depressive disorder, single episode, unspecified: Secondary | ICD-10-CM | POA: Diagnosis present

## 2020-02-25 DIAGNOSIS — S72001D Fracture of unspecified part of neck of right femur, subsequent encounter for closed fracture with routine healing: Secondary | ICD-10-CM | POA: Diagnosis not present

## 2020-02-25 DIAGNOSIS — E876 Hypokalemia: Secondary | ICD-10-CM | POA: Diagnosis present

## 2020-02-25 DIAGNOSIS — E669 Obesity, unspecified: Secondary | ICD-10-CM | POA: Diagnosis present

## 2020-02-25 DIAGNOSIS — W19XXXA Unspecified fall, initial encounter: Secondary | ICD-10-CM | POA: Diagnosis not present

## 2020-02-25 DIAGNOSIS — Z20822 Contact with and (suspected) exposure to covid-19: Secondary | ICD-10-CM | POA: Diagnosis present

## 2020-02-25 DIAGNOSIS — I2510 Atherosclerotic heart disease of native coronary artery without angina pectoris: Secondary | ICD-10-CM | POA: Diagnosis present

## 2020-02-25 DIAGNOSIS — Z8249 Family history of ischemic heart disease and other diseases of the circulatory system: Secondary | ICD-10-CM

## 2020-02-25 DIAGNOSIS — R569 Unspecified convulsions: Secondary | ICD-10-CM

## 2020-02-25 DIAGNOSIS — E785 Hyperlipidemia, unspecified: Secondary | ICD-10-CM | POA: Diagnosis present

## 2020-02-25 DIAGNOSIS — I10 Essential (primary) hypertension: Secondary | ICD-10-CM | POA: Diagnosis not present

## 2020-02-25 DIAGNOSIS — Z825 Family history of asthma and other chronic lower respiratory diseases: Secondary | ICD-10-CM | POA: Diagnosis not present

## 2020-02-25 DIAGNOSIS — Z8673 Personal history of transient ischemic attack (TIA), and cerebral infarction without residual deficits: Secondary | ICD-10-CM | POA: Diagnosis not present

## 2020-02-25 DIAGNOSIS — I110 Hypertensive heart disease with heart failure: Secondary | ICD-10-CM | POA: Diagnosis present

## 2020-02-25 DIAGNOSIS — Z9889 Other specified postprocedural states: Secondary | ICD-10-CM | POA: Insufficient documentation

## 2020-02-25 DIAGNOSIS — S72001A Fracture of unspecified part of neck of right femur, initial encounter for closed fracture: Secondary | ICD-10-CM | POA: Diagnosis present

## 2020-02-25 DIAGNOSIS — W010XXA Fall on same level from slipping, tripping and stumbling without subsequent striking against object, initial encounter: Secondary | ICD-10-CM | POA: Diagnosis present

## 2020-02-25 DIAGNOSIS — Z6833 Body mass index (BMI) 33.0-33.9, adult: Secondary | ICD-10-CM

## 2020-02-25 DIAGNOSIS — Z86718 Personal history of other venous thrombosis and embolism: Secondary | ICD-10-CM

## 2020-02-25 DIAGNOSIS — Z803 Family history of malignant neoplasm of breast: Secondary | ICD-10-CM | POA: Diagnosis not present

## 2020-02-25 DIAGNOSIS — Z95810 Presence of automatic (implantable) cardiac defibrillator: Secondary | ICD-10-CM

## 2020-02-25 DIAGNOSIS — J4520 Mild intermittent asthma, uncomplicated: Secondary | ICD-10-CM

## 2020-02-25 DIAGNOSIS — Z8781 Personal history of (healed) traumatic fracture: Secondary | ICD-10-CM | POA: Insufficient documentation

## 2020-02-25 DIAGNOSIS — J45909 Unspecified asthma, uncomplicated: Secondary | ICD-10-CM | POA: Diagnosis present

## 2020-02-25 DIAGNOSIS — D62 Acute posthemorrhagic anemia: Secondary | ICD-10-CM | POA: Diagnosis not present

## 2020-02-25 DIAGNOSIS — Z818 Family history of other mental and behavioral disorders: Secondary | ICD-10-CM

## 2020-02-25 DIAGNOSIS — F418 Other specified anxiety disorders: Secondary | ICD-10-CM | POA: Diagnosis present

## 2020-02-25 DIAGNOSIS — Z7901 Long term (current) use of anticoagulants: Secondary | ICD-10-CM

## 2020-02-25 DIAGNOSIS — R1900 Intra-abdominal and pelvic swelling, mass and lump, unspecified site: Secondary | ICD-10-CM | POA: Diagnosis present

## 2020-02-25 DIAGNOSIS — I69351 Hemiplegia and hemiparesis following cerebral infarction affecting right dominant side: Secondary | ICD-10-CM | POA: Diagnosis not present

## 2020-02-25 DIAGNOSIS — Z79899 Other long term (current) drug therapy: Secondary | ICD-10-CM

## 2020-02-25 DIAGNOSIS — Z833 Family history of diabetes mellitus: Secondary | ICD-10-CM

## 2020-02-25 DIAGNOSIS — G8929 Other chronic pain: Secondary | ICD-10-CM | POA: Diagnosis present

## 2020-02-25 DIAGNOSIS — T148XXA Other injury of unspecified body region, initial encounter: Secondary | ICD-10-CM

## 2020-02-25 DIAGNOSIS — F419 Anxiety disorder, unspecified: Secondary | ICD-10-CM | POA: Diagnosis present

## 2020-02-25 LAB — PROTIME-INR
INR: 0.9 (ref 0.8–1.2)
Prothrombin Time: 12 seconds (ref 11.4–15.2)

## 2020-02-25 LAB — CBC WITH DIFFERENTIAL/PLATELET
Abs Immature Granulocytes: 0.06 10*3/uL (ref 0.00–0.07)
Basophils Absolute: 0 10*3/uL (ref 0.0–0.1)
Basophils Relative: 0 %
Eosinophils Absolute: 0.1 10*3/uL (ref 0.0–0.5)
Eosinophils Relative: 2 %
HCT: 44.6 % (ref 36.0–46.0)
Hemoglobin: 14.5 g/dL (ref 12.0–15.0)
Immature Granulocytes: 1 %
Lymphocytes Relative: 15 %
Lymphs Abs: 1.2 10*3/uL (ref 0.7–4.0)
MCH: 29.4 pg (ref 26.0–34.0)
MCHC: 32.5 g/dL (ref 30.0–36.0)
MCV: 90.3 fL (ref 80.0–100.0)
Monocytes Absolute: 0.5 10*3/uL (ref 0.1–1.0)
Monocytes Relative: 6 %
Neutro Abs: 6.3 10*3/uL (ref 1.7–7.7)
Neutrophils Relative %: 76 %
Platelets: 344 10*3/uL (ref 150–400)
RBC: 4.94 MIL/uL (ref 3.87–5.11)
RDW: 14.1 % (ref 11.5–15.5)
WBC: 8.2 10*3/uL (ref 4.0–10.5)
nRBC: 0 % (ref 0.0–0.2)

## 2020-02-25 LAB — TYPE AND SCREEN
ABO/RH(D): B NEG
Antibody Screen: NEGATIVE

## 2020-02-25 LAB — COMPREHENSIVE METABOLIC PANEL
ALT: 29 U/L (ref 0–44)
AST: 28 U/L (ref 15–41)
Albumin: 3.9 g/dL (ref 3.5–5.0)
Alkaline Phosphatase: 125 U/L (ref 38–126)
Anion gap: 12 (ref 5–15)
BUN: 17 mg/dL (ref 6–20)
CO2: 25 mmol/L (ref 22–32)
Calcium: 8.9 mg/dL (ref 8.9–10.3)
Chloride: 105 mmol/L (ref 98–111)
Creatinine, Ser: 0.79 mg/dL (ref 0.44–1.00)
GFR calc Af Amer: >60 mL/min (ref >60)
GFR calc non Af Amer: >60 mL/min (ref >60)
Glucose, Bld: 96 mg/dL (ref 70–99)
Potassium: 2.9 mmol/L — ABNORMAL LOW (ref 3.5–5.1)
Sodium: 142 mmol/L (ref 135–145)
Total Bilirubin: 0.8 mg/dL (ref 0.3–1.2)
Total Protein: 7.6 g/dL (ref 6.5–8.1)

## 2020-02-25 LAB — HEPARIN LEVEL (UNFRACTIONATED): Heparin Unfractionated: 0.22 IU/mL — ABNORMAL LOW (ref 0.30–0.70)

## 2020-02-25 LAB — APTT: aPTT: 26 seconds (ref 24–36)

## 2020-02-25 LAB — SARS CORONAVIRUS 2 BY RT PCR (HOSPITAL ORDER, PERFORMED IN ~~LOC~~ HOSPITAL LAB): SARS Coronavirus 2: NEGATIVE

## 2020-02-25 LAB — BRAIN NATRIURETIC PEPTIDE: B Natriuretic Peptide: 112.6 pg/mL — ABNORMAL HIGH (ref 0.0–100.0)

## 2020-02-25 MED ORDER — HEPARIN BOLUS VIA INFUSION
4000.0000 [IU] | Freq: Once | INTRAVENOUS | Status: AC
  Administered 2020-02-25: 4000 [IU] via INTRAVENOUS
  Filled 2020-02-25: qty 4000

## 2020-02-25 MED ORDER — HYDROMORPHONE HCL 1 MG/ML IJ SOLN
1.0000 mg | INTRAMUSCULAR | Status: DC | PRN
  Administered 2020-02-25 – 2020-02-28 (×7): 1 mg via INTRAVENOUS
  Filled 2020-02-25 (×9): qty 1

## 2020-02-25 MED ORDER — LORATADINE 10 MG PO TABS
10.0000 mg | ORAL_TABLET | Freq: Every day | ORAL | Status: DC
  Administered 2020-02-26 – 2020-03-01 (×4): 10 mg via ORAL
  Filled 2020-02-25 (×4): qty 1

## 2020-02-25 MED ORDER — HEPARIN (PORCINE) 25000 UT/250ML-% IV SOLN
1150.0000 [IU]/h | INTRAVENOUS | Status: DC
  Administered 2020-02-25: 1150 [IU]/h via INTRAVENOUS
  Filled 2020-02-25: qty 250

## 2020-02-25 MED ORDER — HYDROMORPHONE HCL 1 MG/ML IJ SOLN
0.5000 mg | Freq: Once | INTRAMUSCULAR | Status: AC
  Administered 2020-02-25: 0.5 mg via INTRAVENOUS
  Filled 2020-02-25: qty 1

## 2020-02-25 MED ORDER — FUROSEMIDE 20 MG PO TABS
20.0000 mg | ORAL_TABLET | Freq: Two times a day (BID) | ORAL | Status: DC
  Administered 2020-02-25 – 2020-02-28 (×5): 20 mg via ORAL
  Filled 2020-02-25 (×5): qty 1

## 2020-02-25 MED ORDER — LEVETIRACETAM 500 MG PO TABS
500.0000 mg | ORAL_TABLET | Freq: Two times a day (BID) | ORAL | Status: DC
  Administered 2020-02-25 – 2020-03-01 (×10): 500 mg via ORAL
  Filled 2020-02-25 (×12): qty 1

## 2020-02-25 MED ORDER — LORAZEPAM 2 MG/ML IJ SOLN: 1.0000 mg | INTRAMUSCULAR | Status: DC | PRN

## 2020-02-25 MED ORDER — POTASSIUM CHLORIDE 10 MEQ/100ML IV SOLN
10.0000 meq | INTRAVENOUS | Status: AC
  Administered 2020-02-25: 10 meq via INTRAVENOUS
  Filled 2020-02-25: qty 100

## 2020-02-25 MED ORDER — SODIUM CHLORIDE 0.9% FLUSH
10.0000 mL | Freq: Two times a day (BID) | INTRAVENOUS | Status: DC
  Administered 2020-02-25 – 2020-03-01 (×8): 10 mL

## 2020-02-25 MED ORDER — ACETAMINOPHEN 500 MG PO TABS: 1000.0000 mg | ORAL_TABLET | Freq: Once | ORAL | Status: DC

## 2020-02-25 MED ORDER — HYDROXYZINE HCL 25 MG PO TABS
25.0000 mg | ORAL_TABLET | Freq: Three times a day (TID) | ORAL | Status: DC | PRN
  Filled 2020-02-25: qty 1

## 2020-02-25 MED ORDER — POTASSIUM CHLORIDE 10 MEQ/100ML IV SOLN
10.0000 meq | INTRAVENOUS | Status: AC
  Administered 2020-02-25 (×2): 10 meq via INTRAVENOUS
  Filled 2020-02-25: qty 100

## 2020-02-25 MED ORDER — METHOCARBAMOL 500 MG PO TABS
500.0000 mg | ORAL_TABLET | Freq: Three times a day (TID) | ORAL | Status: DC | PRN
  Administered 2020-02-25 – 2020-02-29 (×4): 500 mg via ORAL
  Filled 2020-02-25 (×5): qty 1

## 2020-02-25 MED ORDER — SENNOSIDES-DOCUSATE SODIUM 8.6-50 MG PO TABS: 1.0000 | ORAL_TABLET | Freq: Every evening | ORAL | Status: DC | PRN

## 2020-02-25 MED ORDER — TOPIRAMATE 100 MG PO TABS
100.0000 mg | ORAL_TABLET | Freq: Two times a day (BID) | ORAL | Status: DC
  Administered 2020-02-25 – 2020-03-01 (×9): 100 mg via ORAL
  Filled 2020-02-25 (×11): qty 1

## 2020-02-25 MED ORDER — FLUTICASONE PROPIONATE HFA 110 MCG/ACT IN AERO
2.0000 | INHALATION_SPRAY | Freq: Two times a day (BID) | RESPIRATORY_TRACT | Status: DC
  Administered 2020-02-25 – 2020-03-01 (×9): 2 via RESPIRATORY_TRACT
  Filled 2020-02-25: qty 12

## 2020-02-25 MED ORDER — CALCIUM CARBONATE-VITAMIN D 500-200 MG-UNIT PO TABS
2.0000 | ORAL_TABLET | Freq: Every day | ORAL | Status: DC
  Administered 2020-02-26 – 2020-03-01 (×4): 2 via ORAL
  Filled 2020-02-25 (×5): qty 2

## 2020-02-25 MED ORDER — AZELASTINE HCL 0.1 % NA SOLN
1.0000 | Freq: Every day | NASAL | Status: DC | PRN
  Filled 2020-02-25: qty 30

## 2020-02-25 MED ORDER — PROMETHAZINE HCL 25 MG/ML IJ SOLN
12.5000 mg | Freq: Once | INTRAMUSCULAR | Status: AC
  Administered 2020-02-25: 12.5 mg via INTRAVENOUS

## 2020-02-25 MED ORDER — SODIUM CHLORIDE 0.9% FLUSH: 10.0000 mL | INTRAVENOUS | Status: DC | PRN

## 2020-02-25 MED ORDER — ALBUTEROL SULFATE (2.5 MG/3ML) 0.083% IN NEBU
2.5000 mg | INHALATION_SOLUTION | RESPIRATORY_TRACT | Status: DC | PRN
  Administered 2020-02-26: 2.5 mg via RESPIRATORY_TRACT
  Filled 2020-02-25: qty 3

## 2020-02-25 MED ORDER — MAGNESIUM OXIDE 400 (241.3 MG) MG PO TABS
400.0000 mg | ORAL_TABLET | Freq: Every day | ORAL | Status: DC
  Administered 2020-02-26 – 2020-03-01 (×4): 400 mg via ORAL
  Filled 2020-02-25 (×4): qty 1

## 2020-02-25 MED ORDER — POTASSIUM CHLORIDE CRYS ER 20 MEQ PO TBCR
40.0000 meq | EXTENDED_RELEASE_TABLET | Freq: Once | ORAL | Status: AC
  Administered 2020-02-25: 40 meq via ORAL
  Filled 2020-02-25: qty 2

## 2020-02-25 MED ORDER — GABAPENTIN 300 MG PO CAPS
300.0000 mg | ORAL_CAPSULE | Freq: Three times a day (TID) | ORAL | Status: DC
  Administered 2020-02-25 – 2020-03-01 (×14): 300 mg via ORAL
  Filled 2020-02-25 (×4): qty 1
  Filled 2020-02-25: qty 3
  Filled 2020-02-25 (×9): qty 1

## 2020-02-25 MED ORDER — ATORVASTATIN CALCIUM 20 MG PO TABS
40.0000 mg | ORAL_TABLET | Freq: Every day | ORAL | Status: DC
  Administered 2020-02-25 – 2020-02-29 (×5): 40 mg via ORAL
  Filled 2020-02-25 (×5): qty 2

## 2020-02-25 MED ORDER — OXYCODONE-ACETAMINOPHEN 5-325 MG PO TABS
1.0000 | ORAL_TABLET | ORAL | Status: DC | PRN
  Administered 2020-02-25 – 2020-03-01 (×11): 1 via ORAL
  Filled 2020-02-25 (×12): qty 1

## 2020-02-25 MED ORDER — PROMETHAZINE HCL 25 MG PO TABS
12.5000 mg | ORAL_TABLET | Freq: Three times a day (TID) | ORAL | Status: DC | PRN
  Administered 2020-02-25: 12.5 mg via ORAL
  Filled 2020-02-25 (×3): qty 1

## 2020-02-25 MED ORDER — METOPROLOL TARTRATE 25 MG PO TABS
25.0000 mg | ORAL_TABLET | Freq: Two times a day (BID) | ORAL | Status: DC
  Administered 2020-02-25 – 2020-02-27 (×4): 25 mg via ORAL
  Filled 2020-02-25 (×5): qty 1

## 2020-02-25 MED ORDER — FAMOTIDINE 20 MG PO TABS
20.0000 mg | ORAL_TABLET | Freq: Two times a day (BID) | ORAL | Status: DC
  Administered 2020-02-25 – 2020-03-01 (×9): 20 mg via ORAL
  Filled 2020-02-25 (×9): qty 1

## 2020-02-25 MED ORDER — VITAMIN D3 25 MCG (1000 UNIT) PO TABS
5000.0000 [IU] | ORAL_TABLET | Freq: Every day | ORAL | Status: DC
  Administered 2020-02-26 – 2020-03-01 (×4): 5000 [IU] via ORAL
  Filled 2020-02-25 (×10): qty 5

## 2020-02-25 MED ORDER — SERTRALINE HCL 50 MG PO TABS
100.0000 mg | ORAL_TABLET | Freq: Every day | ORAL | Status: DC
  Administered 2020-02-26 – 2020-03-01 (×4): 100 mg via ORAL
  Filled 2020-02-25 (×4): qty 2

## 2020-02-25 NOTE — Plan of Care (Signed)

## 2020-02-25 NOTE — H&P (Signed)
History and Physical    Claire Mcdonald Claire Mcdonald DOB: [birth year] DOA: 02/25/2020  Referring MD/NP/PA:   PCP: [facility] [place]   Patient coming from:  The patient is coming from home.  At baseline, pt is independent for most of ADL.        Chief Complaint: fall and right hip pain  HPI: Claire Mcdonald is a 54 y.o. female with medical history significant of hypertension, hyperlipidemia, stroke with right-sided weakness, GERD, depression, anxiety, seizure, idiopathic cardiac arrest, s/p of ICD placement, V. tach, CHF, DVT on Xarelto, CAD, diverticulosis, pancreatitis, who presents with fall and right hip pain.  Pt states that she has right-sided weakness from previous stroke.  She accidentally fell onto her right hip since her right leg gave out today.  She developed severe pain in the right hip, which is constant, sharp, nonradiating.  No loss of consciousness.  Patient does not have chest pain, cough, shortness of breath.  She has nausea, no vomiting, diarrhea or abdominal pain no symptoms of UTI.  She also has some mild lower back pain.  ED Course: pt was found to have WBC 8.2, INR 0.9, pending COVID-19 PCR, potassium 2.9, renal function okay, temperature normal, blood pressure 150/56, heart rate 70, RR 19, oxygen saturation 99% on room air.  Chest x-ray negative.  CT of head and CT of C-spine negative.  Patient is admitted to [facility] bed as inpatient.  Orthopedic surgeon, Dr. Harlow Mares is consulted.  CT-pelivs: 1. Acute displaced fracture of the RIGHT subcapital femoral neck. 2. No acute pelvic fractures. 3. Colonic diverticulosis. 4. Hysterectomy. 5. 5.2 x 3.4 x 4.8 centimeters fluid attenuation structure in the LEFT LOWER QUADRANT, possibly representing a seroma or lymphocele. Less likely the findings could be related to ovarian mass. Recommend further evaluation with elective CT of the abdomen and pelvis with intravenous contrast. 6. 1.5 centimeter low-attenuation mass or lymph node  in the LEFT external iliac chain. 7. Remote LOWER lumbar-sacral fusion.    CT-L spin: 1. No acute traumatic injury identified.  2. Posterior and interbody fusion since October at L4-L5 and L5-S1 with developing interbody arthrodesis suspected at both levels. No hardware loosening, and no spinal stenosis. However, possible disc or disc osteophyte fragment in the left L4 neural foramen which is new from October. Query left L4 radiculitis.  3. Increased epidural lipomatosis at L3-L4 with new partial effacement the thecal sac. But no other adjacent segment disease.  4. CT Pelvis today reported separately.    Review of Systems:   General: no fevers, chills, no body weight gain, has fatigue HEENT: no blurry vision, hearing changes or sore throat Respiratory: no dyspnea, coughing, wheezing CV: no chest pain, no palpitations GI: has nausea, no vomiting, abdominal pain, diarrhea, constipation GU: no dysuria, burning on urination, increased urinary frequency, hematuria  Ext: no leg edema Neuro: has right sided weakness, no vision change or hearing loss. Has fall.  Skin: no rash, no skin tear. MSK: has right hip pain and lower back pain Heme: No easy bruising.  Travel history: No recent long distant travel.  Allergy:  Allergies  Allergen Reactions  . Tape Other (See Comments)    likely allergy to adhesive/ dermabond skin glue - blisters noted at edge of adhesives likely allergy to adhesive/ dermabond skin glue - blisters noted at edge of adhesives   . Iodinated Diagnostic Agents Rash    On face   . Toradol [Ketorolac Tromethamine] Itching and Rash  . Tramadol Itching and Rash  .  Zofran [Ondansetron Hcl] Itching and Rash    Past Medical History:  Diagnosis Date  . Allergy   . Anxiety   . Asthma 05/06/2016  . Coronary artery disease   . DDD (degenerative disc disease), cervical   . Depression   . Diverticulosis    seen on CT 2015  . DVT (deep venous thrombosis) ([place])    . GERD (gastroesophageal reflux disease)   . Heart failure ([place])   . High cholesterol   . Hyperlipidemia   . Hypertension   . Idiopathic cardiac arrest ([place]) 02/2015   V. fib, EF nl, non-obs CAD; [employer] ICD  . Migraine   . Obesity (BMI 30-39.9)   . Seizures ([place])   . Ventricular tachycardia [facility])     Past Surgical History:  Procedure Laterality Date  . ABDOMINAL HYSTERECTOMY    . CARDIAC CATHETERIZATION N/A 03/27/2015   Procedure: Left Heart Cath and Coronary Angiography;  Surgeon: Troy Sine, MD;  LAD 10%/30%, CFX and RCA OK, EF normal  . CENTRAL LINE  03/25/2015      . COLONOSCOPY WITH PROPOFOL N/A 10/01/2015   Procedure: COLONOSCOPY WITH PROPOFOL;  Surgeon: Josefine Class, MD;  Location: [facility] ENDOSCOPY;  Service: Endoscopy;  Laterality: N/A;  . EP IMPLANTABLE DEVICE N/A 04/11/2015   Procedure: ICD Implant;  Surgeon: Deboraha Sprang, MD;  [facility] ICD, serial number [id number]  . EP IMPLANTABLE DEVICE N/A 07/16/2015   Procedure: Lead Revision/Repair;  Surgeon: Will Meredith Leeds, MD;  Location: [place] CV LAB;  Service: Cardiovascular;  Laterality: N/A;  . ESOPHAGOGASTRODUODENOSCOPY (EGD) WITH PROPOFOL N/A 10/01/2015   Procedure: ESOPHAGOGASTRODUODENOSCOPY (EGD) WITH PROPOFOL;  Surgeon: Josefine Class, MD;  Location: [facility] ENDOSCOPY;  Service: Endoscopy;  Laterality: N/A;  . HAND SURGERY Left   . OVARIAN CYST REMOVAL Left   . TUBAL LIGATION  1998    Social History:  reports that she has never smoked. She has never used smokeless tobacco. She reports that she does not drink alcohol and does not use drugs.  Family History:  Family History  Problem Relation Age of Onset  . Diabetes Father   . Asthma Father   . Hyperlipidemia Father   . Hypertension Father   . Kidney disease Father   . Asthma Mother   . Depression Mother   . Hyperlipidemia Mother   . Hypertension Mother   . Cancer Maternal Grandmother   . Early death Maternal Grandmother   . Breast cancer  Maternal Grandmother   . Early death Paternal Grandmother   . Allergies Daughter   . Allergies Son   . Allergies Son   . Heart disease Son   . Breast cancer Paternal Aunt   . Stroke Neg Hx      Prior to Admission medications   Medication Sig Start Date End Date Taking? Authorizing Provider  acetaminophen (TYLENOL) 500 MG tablet Take by mouth. 11/21/18   [provider]  albuterol (PROVENTIL HFA;VENTOLIN HFA) 108 (90 BASE) MCG/ACT inhaler Inhale 2 puffs into the lungs every 6 (six) hours as needed for wheezing or shortness of breath. 01/23/15   Menshew, Dannielle Karvonen, PA-C  atorvastatin (LIPITOR) 20 MG tablet Take 1 tablet (20 mg total) by mouth daily at 6 PM. Patient taking differently: Take 40 mg by mouth daily at 6 PM.  07/06/15   [name], NP  azelastine (ASTELIN) 0.1 % nasal spray 1 spray by Each Nare route Two (2) times a day. Use in each nostril as  directed 12/01/19 11/30/20  [provider]  Calcium Carbonate-Vit D-Min (GNP CALCIUM 1200) 1200-1000 MG-UNIT CHEW Chew 1,200 mg by mouth daily with breakfast. Take in combination with vitamin D and magnesium. 06/13/19 06/12/20  Milinda Pointer, MD  cetirizine (ZYRTEC) 10 MG tablet Take by mouth. 08/23/18 01/05/20  [provider]  Cholecalciferol (VITAMIN D3) 125 MCG (5000 UT) TABS Take by mouth. 11/23/18   [provider]  famotidine (PEPCID) 20 MG tablet Take 1 tablet (20 mg total) by mouth 2 (two) times daily. 03/27/19 03/26/20  Nance Pear, MD  fluticasone (FLOVENT HFA) 110 MCG/ACT inhaler Inhale into the lungs. 05/22/17 01/14/20  [provider]  furosemide (LASIX) 20 MG tablet Take 20 mg by mouth daily and may take an additional daily as directed Patient taking differently: 40 mg. Take 20 mg by mouth daily and may take an additional daily as directed 09/21/15   Deboraha Sprang, MD  gabapentin (NEURONTIN) 300 MG capsule Take 300 mg by mouth daily.    [provider]  hydrOXYzine  (ATARAX/VISTARIL) 25 MG tablet Take 25 mg by mouth as needed.  01/14/19   [provider]  levETIRAcetam (KEPPRA) 500 MG tablet Take by mouth. 12/08/18   [provider]  Magnesium 400 MG TABS Take 400 mg by mouth daily.    [provider]  metoprolol tartrate (LOPRESSOR) 25 MG tablet Take 0.5 tablets (12.5 mg total) by mouth 2 (two) times daily. Patient taking differently: Take 25 mg by mouth 2 (two) times daily.  08/03/15   [name], NP  promethazine (PHENERGAN) 12.5 MG tablet Take by mouth. 12/24/18   [provider]  rivaroxaban (XARELTO) 20 MG TABS tablet Take 20 mg by mouth daily with supper.    [provider]  topiramate (TOPAMAX) 100 MG tablet Take 1 tablet (100 mg total) by mouth 2 (two) times daily. 04/12/15   Buriev, Arie Sabina, MD  triamcinolone (KENALOG) 0.025 % ointment APPLY TOPICALLY TWICE DAILY AS NEEDED FOR ITCH. YOU CAN ALSO MIX WITHAQUAPHOROR VASELINE IN A 1:1 RATIO. AVOID FACE AND SKIN FOLDS 01/03/19   [provider]    Physical Exam: Vitals:   02/25/20 0936 02/25/20 0938 02/25/20 1036 02/25/20 1225  BP:  (!) 141/99 (!) 150/56 (!) 113/57  Pulse:  63 70 62  Resp:  15 19 11   Temp:  97.6 F (36.4 C)    TempSrc:  Oral    SpO2:  100% 99% 97%  Weight: 82.1 kg     Height: 5\' 2"  (1.575 m)      General: Not in acute distress HEENT:       Eyes: PERRL, EOMI, no scleral icterus.       ENT: No discharge from the ears and nose, no pharynx injection, no tonsillar enlargement.        Neck: No JVD, no bruit, no mass felt. Heme: No neck lymph node enlargement. Cardiac: S1/S2, RRR, No murmurs, No gallops or rubs. Respiratory:  No rales, wheezing, rhonchi or rubs. GI: Soft, nondistended, nontender, no rebound pain, no organomegaly, BS present. GU: No hematuria Ext: No pitting leg edema bilaterally. 2+DP/PT pulse bilaterally. Musculoskeletal: has tenderness in right hip. Skin: No rashes.  Neuro: Alert, oriented X3, cranial  nerves II-XII grossly intact, has chronic right-sided weakness Psych: Patient is not psychotic, no suicidal or hemocidal ideation.  Labs on Admission: I have personally reviewed following labs and imaging studies  CBC: Recent Labs  Lab 02/25/20 1034  WBC 8.2  NEUTROABS 6.3  HGB 14.5  HCT 44.6  MCV 90.3  PLT 295   Basic Metabolic Panel: Recent Labs  Lab 02/25/20 1034  NA 142  K 2.9*  CL 105  CO2 25  GLUCOSE 96  BUN 17  CREATININE 0.79  CALCIUM 8.9   GFR: Estimated Creatinine Clearance: 81.7 mL/min (by C-G formula based on SCr of 0.79 mg/dL). Liver Function Tests: Recent Labs  Lab 02/25/20 1034  AST 28  ALT 29  ALKPHOS 125  BILITOT 0.8  PROT 7.6  ALBUMIN 3.9   No results for input(s): LIPASE, AMYLASE in the last 168 hours. No results for input(s): AMMONIA in the last 168 hours. Coagulation Profile: Recent Labs  Lab 02/25/20 1034  INR 0.9   Cardiac Enzymes: No results for input(s): CKTOTAL, CKMB, CKMBINDEX, TROPONINI in the last 168 hours. BNP (last 3 results) No results for input(s): PROBNP in the last 8760 hours. HbA1C: No results for input(s): HGBA1C in the last 72 hours. CBG: No results for input(s): GLUCAP in the last 168 hours. Lipid Profile: No results for input(s): CHOL, HDL, LDLCALC, TRIG, CHOLHDL, LDLDIRECT in the last 72 hours. Thyroid Function Tests: No results for input(s): TSH, T4TOTAL, FREET4, T3FREE, THYROIDAB in the last 72 hours. Anemia Panel: No results for input(s): VITAMINB12, FOLATE, FERRITIN, TIBC, IRON, RETICCTPCT in the last 72 hours. Urine analysis:    Component Value Date/Time   COLORURINE YELLOW (A) 04/05/2019 1116   APPEARANCEUR CLOUDY (A) 04/05/2019 1116   APPEARANCEUR Hazy 06/24/2013 0834   LABSPEC 1.021 04/05/2019 1116   LABSPEC 1.010 06/24/2013 0834   PHURINE 7.0 04/05/2019 1116   GLUCOSEU NEGATIVE 04/05/2019 1116   GLUCOSEU Negative 06/24/2013 0834   HGBUR SMALL (A) 04/05/2019 1116   BILIRUBINUR NEGATIVE  04/05/2019 1116   BILIRUBINUR Negative 06/24/2013 0834   KETONESUR NEGATIVE 04/05/2019 1116   PROTEINUR NEGATIVE 04/05/2019 1116   UROBILINOGEN 0.2 04/08/2015 0858   NITRITE NEGATIVE 04/05/2019 1116   LEUKOCYTESUR TRACE (A) 04/05/2019 1116   LEUKOCYTESUR Negative 06/24/2013 0834   Sepsis Labs: @LABRCNTIP (procalcitonin:4,lacticidven:4) )No results found for this or any previous visit (from the past 240 hour(s)).   Radiological Exams on Admission: CT Head Wo Contrast  Result Date: 02/25/2020 CLINICAL DATA:  Status post fall with neck pain EXAM: CT HEAD WITHOUT CONTRAST CT CERVICAL SPINE WITHOUT CONTRAST TECHNIQUE: Multidetector CT imaging of the head and cervical spine was performed following the standard protocol without intravenous contrast. Multiplanar CT image reconstructions of the cervical spine were also generated. COMPARISON:  Cervical spine CT October 27, 2012 FINDINGS: CT HEAD FINDINGS Brain: No evidence of acute infarction, hemorrhage, hydrocephalus, extra-axial collection or mass lesion/mass effect. There is chronic diffuse atrophy. Vascular: No hyperdense vessel is noted. Skull: Normal. Negative for fracture or focal lesion. Sinuses/Orbits: No acute finding. Other: None. CT CERVICAL SPINE FINDINGS Alignment: There is straightening of cervical spine probably due to muscle spasm or positioning. Skull base and vertebrae: No acute fracture. No primary bone lesion or focal pathologic process. Soft tissues and spinal canal: No prevertebral fluid or swelling. No visible canal hematoma. Disc levels: Minimal decreased intervertebral spaces are identified in the mid to lower cervical spine. No significant osteophytosis are noted. Upper chest: Negative. Other: None. IMPRESSION: 1. No focal acute intracranial abnormality identified. 2. No acute fracture or dislocation of cervical spine. 3. Straightening of cervical spine probably due to muscle spasm or positioning. Electronically Signed   By: Abelardo Diesel M.D.   On: 02/25/2020 11:08   CT Cervical Spine Wo Contrast  Result Date: 02/25/2020 CLINICAL DATA:  Status post fall with neck pain EXAM: CT HEAD WITHOUT CONTRAST CT CERVICAL SPINE WITHOUT CONTRAST TECHNIQUE: Multidetector CT imaging of the head and cervical spine was performed following the standard protocol without intravenous contrast. Multiplanar CT image reconstructions of the cervical spine were also generated. COMPARISON:  Cervical spine CT October 27, 2012 FINDINGS: CT HEAD FINDINGS Brain: No evidence of acute infarction, hemorrhage, hydrocephalus, extra-axial collection or mass lesion/mass effect. There is chronic diffuse atrophy. Vascular: No hyperdense vessel is noted. Skull: Normal. Negative for fracture or focal lesion. Sinuses/Orbits: No acute finding. Other: None. CT CERVICAL SPINE FINDINGS Alignment: There is straightening of cervical spine probably due to muscle spasm or positioning. Skull base and vertebrae: No acute fracture. No primary bone lesion or focal pathologic process. Soft tissues and spinal canal: No prevertebral fluid or swelling. No visible canal hematoma. Disc levels: Minimal decreased intervertebral spaces are identified in the mid to lower cervical spine. No significant osteophytosis are noted. Upper chest: Negative. Other: None. IMPRESSION: 1. No focal acute intracranial abnormality identified. 2. No acute fracture or dislocation of cervical spine. 3. Straightening of cervical spine probably due to muscle spasm or positioning. Electronically Signed   By: Abelardo Diesel M.D.   On: 02/25/2020 11:08   CT Lumbar Spine Wo Contrast  Result Date: 02/25/2020 CLINICAL DATA:  54 year old female status post fall this morning. Pain. EXAM: CT LUMBAR SPINE WITHOUT CONTRAST TECHNIQUE: Multidetector CT imaging of the lumbar spine was performed without intravenous contrast administration. Multiplanar CT image reconstructions were also generated. COMPARISON:  CT Pelvis today reported  separately. CT lumbar myelogram 06/16/2019. FINDINGS: Segmentation: Normal, the same numbering system used on the prior CT myelogram. Alignment: Stable lumbar lordosis. Stable to mildly reduced chronic anterolisthesis of L5 on S1 following surgery since October. Vertebrae: Postoperative details are below. Stable lumbar vertebral height and alignment. Visible lower thoracic levels appear intact. No acute osseous abnormality identified. Visible sacrum and SI joints appear intact. Paraspinal and other soft tissues: Negative noncontrast abdominal viscera. Diverticulosis in the visible distal colon. Pelvic viscera reported separately today. Mild postoperative changes to the lower lumbar paraspinal soft tissues with no adverse features. Disc levels: T11-T12: Mild vacuum disc and endplate spurring but no evidence of stenosis. T12-L1:  Negative. L1-L2:  Subtle retrolisthesis and disc bulge.  No stenosis. L2-L3:  Negative. L3-L4: Increased epidural lipomatosis since October which partially effaces the thecal sac on series 9, image 73. Mild facet hypertrophy appears stable. Otherwise negative. L4-L5: Interval posterior and interbody fusion hardware placement. Evidence of developing interbody arthrodesis. Chronic posterior element hypertrophy, no definite posterior element arthrodesis at this time. No evidence of hardware loosening. Residual endplate spurring. No spinal stenosis suspected. However, there appears to be a small partially calcified disc fragment or less likely endplate fragment in the left L4 neural foramen best seen on series 7, image 48. This is not well correlated on axial images. L5-S1: Interval posterior and anterior fusion hardware placement with stable to regressed chronic spondylolisthesis at this level. Evidence of developing interbody arthrodesis. No hardware loosening. Chronic posterior element hypertrophy, no definite posterior element arthrodesis at this time. Bilateral L5 foraminal patency appears  improved, with no spinal stenosis suspected. IMPRESSION: 1. No acute traumatic injury identified. 2. Posterior and interbody fusion since October at L4-L5 and L5-S1 with developing interbody arthrodesis suspected at both levels. No hardware loosening, and no spinal stenosis. However, possible disc or disc osteophyte fragment in the left L4 neural foramen which is new from  October. Query left L4 radiculitis. 3. Increased epidural lipomatosis at L3-L4 with new partial effacement the thecal sac. But no other adjacent segment disease. 4. CT Pelvis today reported separately. Electronically Signed   By: Genevie Ann M.D.   On: 02/25/2020 11:12   CT PELVIS WO CONTRAST  Result Date: 02/25/2020 CLINICAL DATA:  Pelvic fracture. EXAM: CT PELVIS WITHOUT CONTRAST TECHNIQUE: Multidetector CT imaging of the pelvis was performed following the standard protocol without intravenous contrast. COMPARISON:  02/19/2017 FINDINGS: Urinary Tract: The bladder and visualized portion of the urethra are normal. Distal ureters are unremarkable. Bowel: There is significant diverticular disease of the descending and sigmoid colon. No associated inflammatory changes to suggest acute diverticulitis. Postoperative changes in the region of the cecum. The appendix is not seen. Regional small bowel loops are unremarkable. Vascular/Lymphatic: Normal noncontrast appearance of the LOWER aorta and other vascular structures. Reproductive: Hysterectomy. Within the LEFT LOWER QUADRANT there is BB a fluid attenuation structure measuring 5.2 x 3.4 x 4.8 centimeters. Just inferior to this, there is a low-attenuation 1.5 centimeter mass or lymph node in the LEFT external iliac chain. Postoperative changes are identified in the Other: No ascites.  Anterior abdominal wall is unremarkable. Musculoskeletal: There is an acute displaced fracture of the RIGHT subcapital femoral neck, associated with foreshortening and overriding of fracture fragments. The femoral head is  located in the acetabulum. No acute pelvic fractures. Mild irregularity identified in the RIGHT superior pubic ramus appears chronic. There is mild degenerative change in the SI joints. Remote LOWER lumbar-sacral fusion. IMPRESSION: 1. Acute displaced fracture of the RIGHT subcapital femoral neck. 2. No acute pelvic fractures. 3. Colonic diverticulosis. 4. Hysterectomy. 5. 5.2 x 3.4 x 4.8 centimeters fluid attenuation structure in the LEFT LOWER QUADRANT, possibly representing a seroma or lymphocele. Less likely the findings could be related to ovarian mass. Recommend further evaluation with elective CT of the abdomen and pelvis with intravenous contrast. 6. 1.5 centimeter low-attenuation mass or lymph node in the LEFT external iliac chain. 7. Remote LOWER lumbar-sacral fusion. Electronically Signed   By: Nolon Nations M.D.   On: 02/25/2020 11:16   DG Chest Portable 1 View  Result Date: 02/25/2020 CLINICAL DATA:  Preop for hip fracture post fall. EXAM: PORTABLE CHEST 1 VIEW COMPARISON:  02/11/2017 FINDINGS: Left-sided pacemaker unchanged. Lungs are adequately inflated and otherwise clear. Remainder the exam is unchanged. IMPRESSION: No active disease. Electronically Signed   By: Marin Olp M.D.   On: 02/25/2020 10:21   DG Hip Unilat W or Wo Pelvis 2-3 Views Right  Result Date: 02/25/2020 CLINICAL DATA:  Preop right hip fracture post fall. EXAM: DG HIP (WITH OR WITHOUT PELVIS) 2-3V RIGHT COMPARISON:  CT 02/11/2017 FINDINGS: Exam demonstrates a moderately displaced subcapital fracture of the right femoral neck with superior displacement of the distal fragment. Mild degenerative changes of the hips, sacroiliac joints and spine. Fusion hardware intact over the lumbosacral spine. Mild irregularity over the right superior pubic ramus and right side of the symphysis likely due to old injury although acute fracture is possible. Remainder the exam is unchanged. IMPRESSION: 1. Moderately displaced subcapital  fracture of the right femoral neck. 2. Mild irregularity of the right superior pubic ramus and right side of the symphysis likely due to old injury and much less likely acute fracture. 3. Mild degenerative changes of the hips, sacroiliac joints and spine. Fusion hardware over the lumbosacral spine intact. Electronically Signed   By: Marin Olp M.D.   On: 02/25/2020 10:20  EKG: Independently reviewed.  Sinus rhythm, QTC 443, early R wave progression, nonspecific to change  Assessment/Plan Principal Problem:   Fracture of femoral neck, right, closed (HCC) Active Problems:   Essential hypertension   Hyperlipidemia   Deep vein thrombosis (DVT) (HCC)   CAD (coronary artery disease)   Anxiety   Asthma   Gastroesophageal reflux disease without esophagitis   Hypokalemia   Seizure ([place])   Fall   History of CVA (cerebrovascular accident)   Abdominal mass   Fracture of femoral neck, right, closed ([place]):  As evidenced by CT scan. No neurovascular compromise. Orthopedic surgeon, Dr. Harlow Mares was consulted.   - will admit to Med-surg bed - Pain control: dilaudid and percocet prn - When necessary Zofran for nausea - Robaxin for muscle spasm - type and cross - INR/PTT - PT/OT when able to (not ordered now)  Essential hypertension: -IV hydralazine as needed -Metoprolol  Hyperlipidemia -Lipitor  Deep vein thrombosis (DVT) (HCC) -switch Xarelto to IV heparin  CAD (coronary artery disease): No  CP -lipitor  Depression and anxiety: Stable, no suicidal or homicidal ideations. -Continue home medications   Asthma: stable -Bronchodilators  Gastroesophageal reflux disease without esophagitis -Pepcid  Hypokalemia: K= 2.9  on admission. - Repleted - Check Mg level  Seizure -Seizure precaution -When necessary Ativan for seizure -Continue Home medications: Keppra and Topamax  Fall -pt/ot when able to  History of CVA (cerebrovascular accident): has left-sided  weakness -Lipitor  Abdominal mass: Patient has incidental findings by CT scan -f/u with PCP and oncology  - 5.2 x 3.4 x 4.8 centimeters fluid attenuation structure in the LEFT LOWER QUADRANT, possibly representing a seroma or lymphocele. Less likely the findings could be related to ovarian mass. Recommend further evaluation with elective CT of the abdomen and pelvis with intravenous contrast.  -1.5 centimeter low-attenuation mass or lymph node in the LEFT external iliac chain.   DVT ppx: on IV heparin  Code Status: Full code Family Communication: not done, no family member is at bed side.   Disposition Plan:  Anticipate discharge back to previous environment Consults called:  Dr. Harlow Mares of ortho Admission status: Med-surg bed as inpt          Date of Service 02/25/2020    [facility]   If 7PM-7AM, please contact night-coverage [email or web address] 02/25/2020, 12:32 PM

## 2020-02-25 NOTE — ED Provider Notes (Signed)
[facility] Emergency Department Provider Note  ____________________________________________   First MD Initiated Contact with Patient 02/25/20 0945     (approximate)  I have reviewed the triage vital signs and the nursing notes.   HISTORY  Chief Complaint Fall    HPI Claire Mcdonald is a 54 y.o. female with DVT on Xarelto, prior cardiac arrest, prior stroke with right-sided deficits who comes in with fall.  Patient states she has some weakness on her right side from prior stroke.  Patient states that her right leg gave out on her and she had a mechanical fall onto her right hip.  She states she did hit her head but it was very minor.  Denies LOC.  She is having severe pain in her right hip constant, nothing makes it better, nothing makes it worse.  She does report a little lower back pain as well.          Past Medical History:  Diagnosis Date  . Allergy   . Anxiety   . Asthma 05/06/2016  . Coronary artery disease   . DDD (degenerative disc disease), cervical   . Depression   . Diverticulosis    seen on CT 2015  . DVT (deep venous thrombosis) ([place])   . GERD (gastroesophageal reflux disease)   . Heart failure ([place])   . High cholesterol   . Hyperlipidemia   . Hypertension   . Idiopathic cardiac arrest ([place]) 02/2015   V. fib, EF nl, non-obs CAD; St. Jude ICD  . Migraine   . Obesity (BMI 30-39.9)   . Seizures ([place])   . Ventricular tachycardia [facility])     Patient Active Problem List   Diagnosis Date Noted  . Acute postoperative pain 02/07/2020  . 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 Chronic pain of right knee 12/26/2019  . TIA (transient ischemic attack) 12/18/2019  . Seasonal allergic rhinitis due to pollen 12/01/2019  . Acute cystitis without hematuria 12/01/2019  . Memory loss 11/08/2019  . Onychomycosis 11/08/2019  . Vertigo 11/08/2019  . Surgical wound breakdown 09/01/2019  . Hx of cardiomyopathy 08/16/2019  . S/P implantation  of automatic cardioverter/defibrillator (AICD) 08/01/2019  . Pars defect of lumbar spine 07/12/2019  . Bacterial vaginosis 07/11/2019  . Other fatigue 07/11/2019  . Elevated amylase 07/05/2019  . Fall 07/05/2019  . UTI symptoms 07/05/2019  . Other headache syndrome 07/05/2019  . Lumbar facet syndrome (Bilateral) (R>L) 06/13/2019  . Spondylosis without myelopathy or radiculopathy, lumbosacral region 06/13/2019  . Restless leg syndrome 05/11/2019  . Difficulty sleeping 04/06/2019  . Abdominal pain 03/29/2019  . Female pelvic pain 01/15/2019  . Clostridium difficile colitis 11/21/2018  . Hematochezia 11/18/2018  . Bronchitis 11/05/2018  . Dysfunction of both eustachian tubes 11/05/2018  . History of implantable cardioverter-defibrillator (ICD) insertion 10/18/2018  . Pain 10/18/2018  . Hypotension 09/11/2018  . Lower extremity edema 08/28/2018  . Acute pancreatitis 08/27/2018  . Diverticulitis 08/25/2018  . Poor appetite 08/20/2018  . Dysuria 08/14/2018  . Burning with urination 08/14/2018  . Bradycardia 07/09/2018  . Hypokalemia 07/09/2018  . S/P ICD (internal cardiac defibrillator) procedure 05/07/2018  . Preoperative evaluation to rule out surgical contraindication 04/20/2018  . Diarrhea 03/31/2018  . Lymphedema of both lower extremities 02/16/2018  . Chronic allergic otitis media of left ear 01/12/2018  . Eczema 01/12/2018  . Vitamin D deficiency 12/16/2017  . History of Allergy to iodine 12/16/2017    Class: History of  . History of allergy to  radiographic contrast media 12/16/2017    Class: History of  . History of placement of internal cardiac defibrillator 12/16/2017    Class: History of  . Grade 2-3 (13-16 mm) Anterolisthesis of L5 over S1 12/15/2017  . Lumbar foraminal stenosis (L5-S1) (Bilateral) 12/15/2017  . Chronic lumbar radicular pain (S1) (Bilateral) 12/15/2017  . Chronic musculoskeletal pain 12/15/2017  . Neurogenic pain 12/15/2017  . Spinal instability  of lumbosacral region (L5-S1) 12/15/2017  . Lumbosacral radiculopathy at S1 12/15/2017  . Chronic low back pain (Primary Area of Pain) (Bilateral) (R>L) 11/30/2017  . Chronic lower extremity pain (Secondary Area of Pain) (Bilateral) (R>L) 11/30/2017  . Chronic pain syndrome 11/30/2017  . Long term current use of opiate analgesic 11/30/2017  . Pharmacologic therapy 11/30/2017  . Disorder of skeletal system 11/30/2017  . Problems influencing health status 11/30/2017  . Urinary incontinence 11/16/2017  . Frequent falls 10/09/2017  . Mental disorder follow-up 10/09/2017  . 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 Pain in both lower extremities 08/14/2017  . Right hip pain 07/03/2017  . Myalgia 06/04/2017  . Left-sided weakness 02/20/2017  . Chronic back pain 01/26/2017  . Transient neurological symptoms 12/04/2016  . Food insecurity 11/02/2016  . Cerebrovascular accident (CVA) due to embolism of left middle cerebral artery ([place]) 11/02/2016  . Ischemic stroke ([place]) 10/19/2016  . History of CVA (cerebrovascular accident) 10/19/2016  . Diverticulosis of large intestine without hemorrhage 09/19/2016  . Hiatal hernia 09/01/2016  . Migraine headache 09/01/2016  . Pain in joint, lower leg 07/25/2016  . H/O: hysterectomy 05/19/2016  . Asthma 05/06/2016  . Hernia, incisional 05/06/2016  . Mild persistent asthma without complication 33/35/4562  . Bilateral carpal tunnel syndrome 12/28/2015  . Moderate episode of recurrent major depressive disorder ([place]) 12/10/2015  . Atypical squamous cell changes of undetermined significance (ASCUS) on cervical cytology with positive high risk human papilloma virus (HPV) 10/15/2015  . Intramural leiomyoma of uterus 10/14/2015  . Anxiety 10/12/2015  . Gastroesophageal reflux disease without esophagitis 10/12/2015  . History of deep venous thrombosis 10/12/2015  . Essential hypertension 09/13/2015  . Hyperlipidemia 09/13/2015    . Deep vein thrombosis (DVT) ([place]) 09/13/2015  . Personal history of other venous thrombosis and embolism 09/13/2015  . 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 Ventricular tachycardia ([place]) 07/16/2015  . CAD (coronary artery disease) 04/16/2015  . Seizure disorder ([place]) 04/16/2015  . Heart failure ([place]) 04/16/2015  . Seizure ([place]) 04/16/2015  . Normal coronary arteries 04/09/2015  . Obesity (BMI 35.0-39.9 without comorbidity) 04/09/2015  . Pulmonary nodule seen on imaging study 04/09/2015  . Chronic anticoagulation (Xarelto) 04/09/2015  . Cough   . Dyspnea   . Cardiac arrest; V. fib, St. Jude ICD 03/25/2015  . History of cardiac arrest 03/25/2015  . DDD (degenerative disc disease), lumbar 01/28/2015  . Lumbar facet arthropathy 01/28/2015  . Sacroiliac joint disease 01/28/2015    Past Surgical History:  Procedure Laterality Date  . ABDOMINAL HYSTERECTOMY    . CARDIAC CATHETERIZATION N/A 03/27/2015   Procedure: Left Heart Cath and Coronary Angiography;  Surgeon: Troy Sine, MD;  LAD 10%/30%, CFX and RCA OK, EF normal  . CENTRAL LINE  03/25/2015      . COLONOSCOPY WITH PROPOFOL N/A 10/01/2015   Procedure: COLONOSCOPY WITH PROPOFOL;  Surgeon: Josefine Class, MD;  Location: [facility] ENDOSCOPY;  Service: Endoscopy;  Laterality: N/A;  . EP IMPLANTABLE DEVICE N/A 04/11/2015   Procedure: ICD Implant;  Surgeon: Deboraha Sprang, MD;  St Jude ICD, serial number [id number]  . EP IMPLANTABLE DEVICE N/A 07/16/2015   Procedure: Lead Revision/Repair;  Surgeon: Will Meredith Leeds, MD;  Location: [place] CV LAB;  Service: Cardiovascular;  Laterality: N/A;  . ESOPHAGOGASTRODUODENOSCOPY (EGD) WITH PROPOFOL N/A 10/01/2015   Procedure: ESOPHAGOGASTRODUODENOSCOPY (EGD) WITH PROPOFOL;  Surgeon: Josefine Class, MD;  Location: [facility] ENDOSCOPY;  Service: Endoscopy;  Laterality: N/A;  . HAND SURGERY Left   . OVARIAN CYST REMOVAL Left   . TUBAL LIGATION  1998    Prior  to Admission medications   Medication Sig Start Date End Date Taking? Authorizing Provider  acetaminophen (TYLENOL) 500 MG tablet Take by mouth. 11/21/18   [provider]  albuterol (PROVENTIL HFA;VENTOLIN HFA) 108 (90 BASE) MCG/ACT inhaler Inhale 2 puffs into the lungs every 6 (six) hours as needed for wheezing or shortness of breath. 01/23/15   Menshew, Dannielle Karvonen, PA-C  atorvastatin (LIPITOR) 20 MG tablet Take 1 tablet (20 mg total) by mouth daily at 6 PM. Patient taking differently: Take 40 mg by mouth daily at 6 PM.  07/06/15   [name], NP  azelastine (ASTELIN) 0.1 % nasal spray 1 spray by Each Nare route Two (2) times a day. Use in each nostril as directed 12/01/19 11/30/20  [provider]  Calcium Carbonate-Vit D-Min (GNP CALCIUM 1200) 1200-1000 MG-UNIT CHEW Chew 1,200 mg by mouth daily with breakfast. Take in combination with vitamin D and magnesium. 06/13/19 06/12/20  Milinda Pointer, MD  cetirizine (ZYRTEC) 10 MG tablet Take by mouth. 08/23/18 01/05/20  [provider]  Cholecalciferol (VITAMIN D3) 125 MCG (5000 UT) TABS Take by mouth. 11/23/18   [provider]  famotidine (PEPCID) 20 MG tablet Take 1 tablet (20 mg total) by mouth 2 (two) times daily. 03/27/19 03/26/20  Nance Pear, MD  fluticasone (FLOVENT HFA) 110 MCG/ACT inhaler Inhale into the lungs. 05/22/17 01/14/20  [provider]  furosemide (LASIX) 20 MG tablet Take 20 mg by mouth daily and may take an additional daily as directed Patient taking differently: 40 mg. Take 20 mg by mouth daily and may take an additional daily as directed 09/21/15   Deboraha Sprang, MD  gabapentin (NEURONTIN) 300 MG capsule Take 300 mg by mouth daily.    [provider]  hydrOXYzine (ATARAX/VISTARIL) 25 MG tablet Take 25 mg by mouth as needed.  01/14/19   [provider]  levETIRAcetam (KEPPRA) 500 MG tablet Take by mouth. 12/08/18   [provider]  Magnesium 400 MG TABS  Take 400 mg by mouth daily.    [provider]  metoprolol tartrate (LOPRESSOR) 25 MG tablet Take 0.5 tablets (12.5 mg total) by mouth 2 (two) times daily. Patient taking differently: Take 25 mg by mouth 2 (two) times daily.  08/03/15   [name], NP  promethazine (PHENERGAN) 12.5 MG tablet Take by mouth. 12/24/18   [provider]  rivaroxaban (XARELTO) 20 MG TABS tablet Take 20 mg by mouth daily with supper.    [provider]  topiramate (TOPAMAX) 100 MG tablet Take 1 tablet (100 mg total) by mouth 2 (two) times daily. 04/12/15   Buriev, Arie Sabina, MD  triamcinolone (KENALOG) 0.025 % ointment APPLY TOPICALLY TWICE DAILY AS NEEDED FOR ITCH. YOU CAN ALSO MIX WITHAQUAPHOROR VASELINE IN A 1:1 RATIO. AVOID FACE AND SKIN FOLDS 01/03/19   [provider]    Allergies Tape, Iodinated diagnostic agents, Toradol [ketorolac tromethamine], Tramadol, and Zofran [ondansetron hcl]  Family History  Problem Relation Age of Onset  . Diabetes Father   . Asthma Father   . Hyperlipidemia Father   . Hypertension Father   . Kidney disease Father   . Asthma Mother   . Depression Mother   . Hyperlipidemia Mother   . Hypertension Mother   . Cancer Maternal Grandmother   . Early death Maternal Grandmother   . Breast cancer Maternal Grandmother   . Early death Paternal Grandmother   . Allergies Daughter   . Allergies Son   . Allergies Son   . Heart disease Son   . Breast cancer Paternal Aunt   . Stroke Neg Hx     Social History Social History   Tobacco Use  . Smoking status: Never Smoker  . Smokeless tobacco: Never Used  Vaping Use  . Vaping Use: Never used  Substance Use Topics  . Alcohol use: No  . Drug use: No      Review of Systems Constitutional: No fever/chills, fall Eyes: No visual changes. ENT: No sore throat.  Hit head Cardiovascular: Denies chest pain. Respiratory: Denies shortness of breath. Gastrointestinal: No abdominal pain.  No nausea,  no vomiting.  No diarrhea.  No constipation. Genitourinary: Negative for dysuria. Musculoskeletal: Low back pain, right hip pain Skin: Negative for rash. Neurological: Negative for headaches, focal weakness or numbness. All other ROS negative ____________________________________________   PHYSICAL EXAM:  VITAL SIGNS: ED Triage Vitals  Enc Vitals Group     BP 02/25/20 0938 (!) 141/99     Pulse Rate 02/25/20 0938 63     Resp 02/25/20 0938 15     Temp 02/25/20 0938 97.6 F (36.4 C)     Temp Source 02/25/20 0938 Oral     SpO2 02/25/20 0935 100 %     Weight 02/25/20 0936 181 lb (82.1 kg)     Height 02/25/20 0936 5\' 2"  (1.575 m)     Head Circumference --      Peak Flow --      Pain Score 02/25/20 0935 10     Pain Loc --      Pain Edu? --      Excl. in [place]? --     Constitutional: Alert and oriented. Well appearing and in no acute distress. Eyes: Conjunctivae are normal. EOMI. Head: Atraumatic. Nose: No congestion/rhinnorhea. Mouth/Throat: Mucous membranes are moist.   Neck: No stridor. Trachea Midline. FROM Cardiovascular: Normal rate, regular rhythm. Grossly normal heart sounds.  Good peripheral circulation. Respiratory: Normal respiratory effort.  No retractions. Lungs CTAB. Gastrointestinal: Soft and nontender. No distention. No abdominal bruits.  Musculoskeletal: Unable to lift the right leg up off the bed with tenderness on the right hip.  2+ distal pulse.  Lower lumbar tenderness Neurologic:  Normal speech and language. No gross focal neurologic deficits are appreciated.  Skin:  Skin is warm, dry and intact. No rash noted. Psychiatric: Mood and affect are normal. Speech and behavior are normal. GU: Deferred   ____________________________________________   LABS (all labs ordered are listed, but only abnormal results are displayed)  Labs Reviewed  COMPREHENSIVE METABOLIC PANEL - Abnormal; Notable for the following components:      Result Value   Potassium 2.9 (*)     All other components within normal limits  CBC WITH DIFFERENTIAL/PLATELET  PROTIME-INR  APTT   ____________________________________________   ED ECG REPORT I, Vanessa [place], the attending physician, personally viewed and interpreted this ECG.  EKG is reading as A. fib but that there is  just a little artifact patient seems to have P waves so I would say sinus no ST elevation, no T wave inversions, normal intervals ____________________________________________  RADIOLOGY Robert Bellow, personally viewed and evaluated these images (plain radiographs) as part of my medical decision making, as well as reviewing the written report by the radiologist.  ED MD interpretation: X-ray shows femoral neck fracture  Official radiology report(s): CT Head Wo Contrast  Result Date: 02/25/2020 CLINICAL DATA:  Status post fall with neck pain EXAM: CT HEAD WITHOUT CONTRAST CT CERVICAL SPINE WITHOUT CONTRAST TECHNIQUE: Multidetector CT imaging of the head and cervical spine was performed following the standard protocol without intravenous contrast. Multiplanar CT image reconstructions of the cervical spine were also generated. COMPARISON:  Cervical spine CT October 27, 2012 FINDINGS: CT HEAD FINDINGS Brain: No evidence of acute infarction, hemorrhage, hydrocephalus, extra-axial collection or mass lesion/mass effect. There is chronic diffuse atrophy. Vascular: No hyperdense vessel is noted. Skull: Normal. Negative for fracture or focal lesion. Sinuses/Orbits: No acute finding. Other: None. CT CERVICAL SPINE FINDINGS Alignment: There is straightening of cervical spine probably due to muscle spasm or positioning. Skull base and vertebrae: No acute fracture. No primary bone lesion or focal pathologic process. Soft tissues and spinal canal: No prevertebral fluid or swelling. No visible canal hematoma. Disc levels: Minimal decreased intervertebral spaces are identified in the mid to lower cervical spine. No significant  osteophytosis are noted. Upper chest: Negative. Other: None. IMPRESSION: 1. No focal acute intracranial abnormality identified. 2. No acute fracture or dislocation of cervical spine. 3. Straightening of cervical spine probably due to muscle spasm or positioning. Electronically Signed   By: Abelardo Diesel M.D.   On: 02/25/2020 11:08   CT Cervical Spine Wo Contrast  Result Date: 02/25/2020 CLINICAL DATA:  Status post fall with neck pain EXAM: CT HEAD WITHOUT CONTRAST CT CERVICAL SPINE WITHOUT CONTRAST TECHNIQUE: Multidetector CT imaging of the head and cervical spine was performed following the standard protocol without intravenous contrast. Multiplanar CT image reconstructions of the cervical spine were also generated. COMPARISON:  Cervical spine CT October 27, 2012 FINDINGS: CT HEAD FINDINGS Brain: No evidence of acute infarction, hemorrhage, hydrocephalus, extra-axial collection or mass lesion/mass effect. There is chronic diffuse atrophy. Vascular: No hyperdense vessel is noted. Skull: Normal. Negative for fracture or focal lesion. Sinuses/Orbits: No acute finding. Other: None. CT CERVICAL SPINE FINDINGS Alignment: There is straightening of cervical spine probably due to muscle spasm or positioning. Skull base and vertebrae: No acute fracture. No primary bone lesion or focal pathologic process. Soft tissues and spinal canal: No prevertebral fluid or swelling. No visible canal hematoma. Disc levels: Minimal decreased intervertebral spaces are identified in the mid to lower cervical spine. No significant osteophytosis are noted. Upper chest: Negative. Other: None. IMPRESSION: 1. No focal acute intracranial abnormality identified. 2. No acute fracture or dislocation of cervical spine. 3. Straightening of cervical spine probably due to muscle spasm or positioning. Electronically Signed   By: Abelardo Diesel M.D.   On: 02/25/2020 11:08   CT Lumbar Spine Wo Contrast  Result Date: 02/25/2020 CLINICAL DATA:  54 year old  female status post fall this morning. Pain. EXAM: CT LUMBAR SPINE WITHOUT CONTRAST TECHNIQUE: Multidetector CT imaging of the lumbar spine was performed without intravenous contrast administration. Multiplanar CT image reconstructions were also generated. COMPARISON:  CT Pelvis today reported separately. CT lumbar myelogram 06/16/2019. FINDINGS: Segmentation: Normal, the same numbering system used on the prior CT myelogram. Alignment: Stable lumbar lordosis. Stable to mildly  reduced chronic anterolisthesis of L5 on S1 following surgery since October. Vertebrae: Postoperative details are below. Stable lumbar vertebral height and alignment. Visible lower thoracic levels appear intact. No acute osseous abnormality identified. Visible sacrum and SI joints appear intact. Paraspinal and other soft tissues: Negative noncontrast abdominal viscera. Diverticulosis in the visible distal colon. Pelvic viscera reported separately today. Mild postoperative changes to the lower lumbar paraspinal soft tissues with no adverse features. Disc levels: T11-T12: Mild vacuum disc and endplate spurring but no evidence of stenosis. T12-L1:  Negative. L1-L2:  Subtle retrolisthesis and disc bulge.  No stenosis. L2-L3:  Negative. L3-L4: Increased epidural lipomatosis since October which partially effaces the thecal sac on series 9, image 73. Mild facet hypertrophy appears stable. Otherwise negative. L4-L5: Interval posterior and interbody fusion hardware placement. Evidence of developing interbody arthrodesis. Chronic posterior element hypertrophy, no definite posterior element arthrodesis at this time. No evidence of hardware loosening. Residual endplate spurring. No spinal stenosis suspected. However, there appears to be a small partially calcified disc fragment or less likely endplate fragment in the left L4 neural foramen best seen on series 7, image 48. This is not well correlated on axial images. L5-S1: Interval posterior and anterior  fusion hardware placement with stable to regressed chronic spondylolisthesis at this level. Evidence of developing interbody arthrodesis. No hardware loosening. Chronic posterior element hypertrophy, no definite posterior element arthrodesis at this time. Bilateral L5 foraminal patency appears improved, with no spinal stenosis suspected. IMPRESSION: 1. No acute traumatic injury identified. 2. Posterior and interbody fusion since October at L4-L5 and L5-S1 with developing interbody arthrodesis suspected at both levels. No hardware loosening, and no spinal stenosis. However, possible disc or disc osteophyte fragment in the left L4 neural foramen which is new from October. Query left L4 radiculitis. 3. Increased epidural lipomatosis at L3-L4 with new partial effacement the thecal sac. But no other adjacent segment disease. 4. CT Pelvis today reported separately. Electronically Signed   By: Genevie Ann M.D.   On: 02/25/2020 11:12   CT PELVIS WO CONTRAST  Result Date: 02/25/2020 CLINICAL DATA:  Pelvic fracture. EXAM: CT PELVIS WITHOUT CONTRAST TECHNIQUE: Multidetector CT imaging of the pelvis was performed following the standard protocol without intravenous contrast. COMPARISON:  02/19/2017 FINDINGS: Urinary Tract: The bladder and visualized portion of the urethra are normal. Distal ureters are unremarkable. Bowel: There is significant diverticular disease of the descending and sigmoid colon. No associated inflammatory changes to suggest acute diverticulitis. Postoperative changes in the region of the cecum. The appendix is not seen. Regional small bowel loops are unremarkable. Vascular/Lymphatic: Normal noncontrast appearance of the LOWER aorta and other vascular structures. Reproductive: Hysterectomy. Within the LEFT LOWER QUADRANT there is BB a fluid attenuation structure measuring 5.2 x 3.4 x 4.8 centimeters. Just inferior to this, there is a low-attenuation 1.5 centimeter mass or lymph node in the LEFT external iliac  chain. Postoperative changes are identified in the Other: No ascites.  Anterior abdominal wall is unremarkable. Musculoskeletal: There is an acute displaced fracture of the RIGHT subcapital femoral neck, associated with foreshortening and overriding of fracture fragments. The femoral head is located in the acetabulum. No acute pelvic fractures. Mild irregularity identified in the RIGHT superior pubic ramus appears chronic. There is mild degenerative change in the SI joints. Remote LOWER lumbar-sacral fusion. IMPRESSION: 1. Acute displaced fracture of the RIGHT subcapital femoral neck. 2. No acute pelvic fractures. 3. Colonic diverticulosis. 4. Hysterectomy. 5. 5.2 x 3.4 x 4.8 centimeters fluid attenuation structure in the LEFT  LOWER QUADRANT, possibly representing a seroma or lymphocele. Less likely the findings could be related to ovarian mass. Recommend further evaluation with elective CT of the abdomen and pelvis with intravenous contrast. 6. 1.5 centimeter low-attenuation mass or lymph node in the LEFT external iliac chain. 7. Remote LOWER lumbar-sacral fusion. Electronically Signed   By: Nolon Nations M.D.   On: 02/25/2020 11:16   DG Chest Portable 1 View  Result Date: 02/25/2020 CLINICAL DATA:  Preop for hip fracture post fall. EXAM: PORTABLE CHEST 1 VIEW COMPARISON:  02/11/2017 FINDINGS: Left-sided pacemaker unchanged. Lungs are adequately inflated and otherwise clear. Remainder the exam is unchanged. IMPRESSION: No active disease. Electronically Signed   By: Marin Olp M.D.   On: 02/25/2020 10:21   DG Hip Unilat W or Wo Pelvis 2-3 Views Right  Result Date: 02/25/2020 CLINICAL DATA:  Preop right hip fracture post fall. EXAM: DG HIP (WITH OR WITHOUT PELVIS) 2-3V RIGHT COMPARISON:  CT 02/11/2017 FINDINGS: Exam demonstrates a moderately displaced subcapital fracture of the right femoral neck with superior displacement of the distal fragment. Mild degenerative changes of the hips, sacroiliac joints  and spine. Fusion hardware intact over the lumbosacral spine. Mild irregularity over the right superior pubic ramus and right side of the symphysis likely due to old injury although acute fracture is possible. Remainder the exam is unchanged. IMPRESSION: 1. Moderately displaced subcapital fracture of the right femoral neck. 2. Mild irregularity of the right superior pubic ramus and right side of the symphysis likely due to old injury and much less likely acute fracture. 3. Mild degenerative changes of the hips, sacroiliac joints and spine. Fusion hardware over the lumbosacral spine intact. Electronically Signed   By: Marin Olp M.D.   On: 02/25/2020 10:20    ____________________________________________   PROCEDURES  Procedure(s) performed (including Critical Care):  Procedures   ____________________________________________   INITIAL IMPRESSION / ASSESSMENT AND PLAN / ED COURSE  Claire Mcdonald was evaluated in Emergency Department on 02/25/2020 for the symptoms described in the history of present illness. She was evaluated in the context of the global COVID-19 pandemic, which necessitated consideration that the patient might be at risk for infection with the SARS-CoV-2 virus that causes COVID-19. Institutional protocols and algorithms that pertain to the evaluation of patients at risk for COVID-19 are in a state of rapid change based on information released by regulatory bodies including the CDC and federal and state organizations. These policies and algorithms were followed during the patient's care in the ED.    Patient is a 54 year old with what sounds are mechanical fall.  Patient is on a blood thinner.  Will get CT head evaluate for intracranial hemorrhage CT cervical evaluate for cervical fracture.  Given the x-ray does show concern for fracture and possible pelvis fracture also on CT pelvis and CT lumbar to make sure no lumbar fractures.  Will give some IV Dilaudid to help with pain.  Will  get some preop labs and get patient admitted to the hospital.  To note there was incidental finding on the CT scan that may require outpatient CT abdomen and pelvis with IV contrast.  Discussed this with patient and she is aware.  CT head and neck are negative  Admitted to the hospital team for femoral neck fracture. Dr. Harlow Mares aware.        ____________________________________________   FINAL CLINICAL IMPRESSION(S) / ED DIAGNOSES   Final diagnoses:  Closed fracture of neck of right femur, initial encounter [facility])  Fall  in home, initial encounter      MEDICATIONS GIVEN DURING THIS VISIT:  Medications  potassium chloride 10 mEq in 100 mL IVPB (10 mEq Intravenous New Bag/Given 02/25/20 1223)  LORazepam (ATIVAN) injection 1 mg (has no administration in time range)  HYDROmorphone (DILAUDID) injection 1 mg (has no administration in time range)  methocarbamol (ROBAXIN) tablet 500 mg (has no administration in time range)  albuterol (VENTOLIN HFA) 108 (90 Base) MCG/ACT inhaler 2 puff (has no administration in time range)  topiramate (TOPAMAX) tablet 100 mg (has no administration in time range)  HYDROmorphone (DILAUDID) injection 0.5 mg (0.5 mg Intravenous Given 02/25/20 1108)  promethazine (PHENERGAN) injection 12.5 mg (12.5 mg Intravenous Given 02/25/20 1133)  HYDROmorphone (DILAUDID) injection 0.5 mg (0.5 mg Intravenous Given 02/25/20 1142)  potassium chloride SA (KLOR-CON) CR tablet 40 mEq (40 mEq Oral Given 02/25/20 1217)     ED Discharge Orders    None       Note:  This document was prepared using Dragon voice recognition software and may include unintentional dictation errors.   Vanessa Walkerville, MD 02/25/20 1247

## 2020-02-25 NOTE — ED Notes (Signed)
Pt transported to XR.  

## 2020-02-25 NOTE — Progress Notes (Signed)
ANTICOAGULATION CONSULT NOTE - Initial Consult  Pharmacy Consult for Heparin  Indication: DVT  Allergies  Allergen Reactions  . Tape Other (See Comments)    likely allergy to adhesive/ dermabond skin glue - blisters noted at edge of adhesives likely allergy to adhesive/ dermabond skin glue - blisters noted at edge of adhesives   . Iodinated Diagnostic Agents Rash    On face   . Toradol [Ketorolac Tromethamine] Itching and Rash  . Tramadol Itching and Rash  . Zofran [Ondansetron Hcl] Itching and Rash    Patient Measurements: Height: 5\' 2"  (157.5 cm) Weight: 82.1 kg (181 lb) IBW/kg (Calculated) : 50.1 Heparin Dosing Weight: 68.5   Vital Signs: Temp: 98.3 F (36.8 C) (07/03 1713) Temp Source: Oral (07/03 1713) BP: 156/85 (07/03 1713) Pulse Rate: 81 (07/03 1713)  Labs: Recent Labs    02/25/20 1034  HGB 14.5  HCT 44.6  PLT 344  APTT 26  LABPROT 12.0  INR 0.9  CREATININE 0.79    Estimated Creatinine Clearance: 81.7 mL/min (by C-G formula based on SCr of 0.79 mg/dL).   Medical History: Past Medical History:  Diagnosis Date  . Allergy   . Anxiety   . Asthma 05/06/2016  . Coronary artery disease   . DDD (degenerative disc disease), cervical   . Depression   . Diverticulosis    seen on CT 2015  . DVT (deep venous thrombosis) ([place])   . GERD (gastroesophageal reflux disease)   . Heart failure ([place])   . High cholesterol   . Hyperlipidemia   . Hypertension   . 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 fib, EF nl, non-obs CAD; St. Jude ICD  . Migraine   . Obesity (BMI 30-39.9)   . Seizures ([place])   . Ventricular tachycardia (HCC)     Medications:  Medications Prior to Admission  Medication Sig Dispense Refill Last Dose  . acetaminophen (TYLENOL) 500 MG tablet Take by mouth.   Past Month at PRN  . atorvastatin (LIPITOR) 20 MG tablet Take 1 tablet (20 mg total) by mouth daily at 6 PM. (Patient taking differently: Take 40 mg by mouth daily at 6 PM. ) 90  tablet 1 02/24/2020 at 1800  . azelastine (ASTELIN) 0.1 % nasal spray 1 spray by Each Nare route Two (2) times a day. Use in each nostril as directed   Past Month at PRN  . Calcium Carbonate-Vit D-Min (GNP CALCIUM 1200) 1200-1000 MG-UNIT CHEW Chew 1,200 mg by mouth daily with breakfast. Take in combination with vitamin D and magnesium. 90 tablet 3 02/24/2020 at 0900  . cetirizine (ZYRTEC) 10 MG tablet Take by mouth.   02/24/2020 at 0900  . Cholecalciferol (VITAMIN D3) 125 MCG (5000 UT) TABS Take by mouth.   02/24/2020 at 0900  . famotidine (PEPCID) 20 MG tablet Take 1 tablet (20 mg total) by mouth 2 (two) times daily. 60 tablet 1 02/24/2020 at 2000  . fluticasone (FLOVENT HFA) 110 MCG/ACT inhaler Inhale into the lungs.   02/24/2020 at Unknown time  . furosemide (LASIX) 20 MG tablet Take 20 mg by mouth daily and may take an additional daily as directed (Patient taking differently: Take 20 mg by mouth 2 (two) times daily. ) 90 tablet 3 02/24/2020 at 2000  . gabapentin (NEURONTIN) 300 MG capsule Take 300 mg by mouth 3 (three) times daily.    02/24/2020 at 2000  . hydrOXYzine (ATARAX/VISTARIL) 25 MG tablet Take 25 mg by mouth as needed.    02/24/2020  at 2000  . levETIRAcetam (KEPPRA) 500 MG tablet Take by mouth.   02/24/2020 at 2000  . Magnesium 400 MG TABS Take 400 mg by mouth daily.   02/24/2020 at 1800  . metoprolol tartrate (LOPRESSOR) 25 MG tablet Take 0.5 tablets (12.5 mg total) by mouth 2 (two) times daily. (Patient taking differently: Take 25 mg by mouth 2 (two) times daily. ) 60 tablet 1 02/24/2020 at 2000  . promethazine (PHENERGAN) 12.5 MG tablet Take by mouth.   Past Week at PRN  . rivaroxaban (XARELTO) 20 MG TABS tablet Take 20 mg by mouth daily with supper.   02/24/2020 at 1800  . sertraline (ZOLOFT) 100 MG tablet TAKE (1) TABLET BY MOUTH EVERY DAY   02/24/2020 at 0800  . topiramate (TOPAMAX) 100 MG tablet Take 1 tablet (100 mg total) by mouth 2 (two) times daily. 60 tablet 1 02/24/2020 at 2000  . triamcinolone  (KENALOG) 0.025 % ointment APPLY TOPICALLY TWICE DAILY AS NEEDED FOR ITCH. YOU CAN ALSO MIX WITHAQUAPHOROR VASELINE IN A 1:1 RATIO. AVOID FACE AND SKIN FOLDS   Past Week at PRN  . albuterol (PROVENTIL HFA;VENTOLIN HFA) 108 (90 BASE) MCG/ACT inhaler Inhale 2 puffs into the lungs every 6 (six) hours as needed for wheezing or shortness of breath. 1 Inhaler 0     Assessment: Pharmacy consulted to dose heparin in this 54 year old female admitted with DVT,  Scheduled for surgery on 7/4.   Pt was on Xarelto 20 mg PO Q Supper ,   Last dose was on 7/2 @ 1800.  CrCl = 81.7 ml/min  APTT = 26  Goal of Therapy:  HL = 0.3 - 0.7  APTT = 66 - 102s  Monitor platelets by anticoagulation protocol: Yes   Plan:  Give 4000 units bolus x 1 Start heparin infusion at 1150 units/hr Check anti-Xa level in 6 hours and daily while on heparin Continue to monitor H&H and platelets   Will use aPTT to guide heparin dosing until HL and aPTT are both therapeutic.  Will draw HL and aPTT 6 hrs after start of drip.   Emmaly Leech D 02/25/2020,5:44 PM

## 2020-02-25 NOTE — ED Triage Notes (Addendum)
Pt to ED via [facility] from home. Pt sustained fall this morning after right knee gave out. Pt stating pain to right lower back and right hip/leg. Pt stating pain 10/10. No notable deformity or shortening to leg. Pt denies hitting head or LOC. Pt currently on blood thinners.   Pt stating hx stroke in 2018 with right sided deficits including numbness to right leg.

## 2020-02-25 NOTE — Progress Notes (Signed)
ANTICOAGULATION CONSULT NOTE - Initial Consult  Pharmacy Consult for Heparin  Indication: DVT  Allergies  Allergen Reactions  . Tape Other (See Comments)    likely allergy to adhesive/ dermabond skin glue - blisters noted at edge of adhesives likely allergy to adhesive/ dermabond skin glue - blisters noted at edge of adhesives   . Iodinated Diagnostic Agents Rash    On face   . Toradol [Ketorolac Tromethamine] Itching and Rash  . Tramadol Itching and Rash  . Zofran [Ondansetron Hcl] Itching and Rash    Patient Measurements: Height: 5\' 2"  (157.5 cm) Weight: 82.1 kg (181 lb) IBW/kg (Calculated) : 50.1 Heparin Dosing Weight: 68.5   Vital Signs: Temp: 98.3 F (36.8 C) (07/03 1713) Temp Source: Oral (07/03 1713) BP: 156/85 (07/03 1713) Pulse Rate: 81 (07/03 1713)  Labs: Recent Labs    02/25/20 1034 02/25/20 1834  HGB 14.5  --   HCT 44.6  --   PLT 344  --   APTT 26  --   LABPROT 12.0  --   INR 0.9  --   HEPARINUNFRC  --  0.22*  CREATININE 0.79  --     Estimated Creatinine Clearance: 81.7 mL/min (by C-G formula based on SCr of 0.79 mg/dL).   Medical History: Past Medical History:  Diagnosis Date  . Allergy   . Anxiety   . Asthma 05/06/2016  . Coronary artery disease   . DDD (degenerative disc disease), cervical   . Depression   . Diverticulosis    seen on CT 2015  . DVT (deep venous thrombosis) ([place])   . GERD (gastroesophageal reflux disease)   . Heart failure ([place])   . High cholesterol   . Hyperlipidemia   . Hypertension   . 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 fib, EF nl, non-obs CAD; St. Jude ICD  . Migraine   . Obesity (BMI 30-39.9)   . Seizures ([place])   . Ventricular tachycardia (HCC)     Medications:  Medications Prior to Admission  Medication Sig Dispense Refill Last Dose  . acetaminophen (TYLENOL) 500 MG tablet Take by mouth.   Past Month at PRN  . atorvastatin (LIPITOR) 20 MG tablet Take 1 tablet (20 mg total) by mouth daily at 6  PM. (Patient taking differently: Take 40 mg by mouth daily at 6 PM. ) 90 tablet 1 02/24/2020 at 1800  . azelastine (ASTELIN) 0.1 % nasal spray 1 spray by Each Nare route Two (2) times a day. Use in each nostril as directed   Past Month at PRN  . Calcium Carbonate-Vit D-Min (GNP CALCIUM 1200) 1200-1000 MG-UNIT CHEW Chew 1,200 mg by mouth daily with breakfast. Take in combination with vitamin D and magnesium. 90 tablet 3 02/24/2020 at 0900  . cetirizine (ZYRTEC) 10 MG tablet Take by mouth.   02/24/2020 at 0900  . Cholecalciferol (VITAMIN D3) 125 MCG (5000 UT) TABS Take by mouth.   02/24/2020 at 0900  . famotidine (PEPCID) 20 MG tablet Take 1 tablet (20 mg total) by mouth 2 (two) times daily. 60 tablet 1 02/24/2020 at 2000  . fluticasone (FLOVENT HFA) 110 MCG/ACT inhaler Inhale into the lungs.   02/24/2020 at Unknown time  . furosemide (LASIX) 20 MG tablet Take 20 mg by mouth daily and may take an additional daily as directed (Patient taking differently: Take 20 mg by mouth 2 (two) times daily. ) 90 tablet 3 02/24/2020 at 2000  . gabapentin (NEURONTIN) 300 MG capsule Take 300 mg by  mouth 3 (three) times daily.    02/24/2020 at 2000  . hydrOXYzine (ATARAX/VISTARIL) 25 MG tablet Take 25 mg by mouth as needed.    02/24/2020 at 2000  . levETIRAcetam (KEPPRA) 500 MG tablet Take by mouth.   02/24/2020 at 2000  . Magnesium 400 MG TABS Take 400 mg by mouth daily.   02/24/2020 at 1800  . metoprolol tartrate (LOPRESSOR) 25 MG tablet Take 0.5 tablets (12.5 mg total) by mouth 2 (two) times daily. (Patient taking differently: Take 25 mg by mouth 2 (two) times daily. ) 60 tablet 1 02/24/2020 at 2000  . promethazine (PHENERGAN) 12.5 MG tablet Take by mouth.   Past Week at PRN  . rivaroxaban (XARELTO) 20 MG TABS tablet Take 20 mg by mouth daily with supper.   02/24/2020 at 1800  . sertraline (ZOLOFT) 100 MG tablet TAKE (1) TABLET BY MOUTH EVERY DAY   02/24/2020 at 0800  . topiramate (TOPAMAX) 100 MG tablet Take 1 tablet (100 mg total) by mouth 2  (two) times daily. 60 tablet 1 02/24/2020 at 2000  . triamcinolone (KENALOG) 0.025 % ointment APPLY TOPICALLY TWICE DAILY AS NEEDED FOR ITCH. YOU CAN ALSO MIX WITHAQUAPHOROR VASELINE IN A 1:1 RATIO. AVOID FACE AND SKIN FOLDS   Past Week at PRN  . albuterol (PROVENTIL HFA;VENTOLIN HFA) 108 (90 BASE) MCG/ACT inhaler Inhale 2 puffs into the lungs every 6 (six) hours as needed for wheezing or shortness of breath. 1 Inhaler 0     Assessment: Pharmacy consulted to dose heparin in this 54 year old female admitted with DVT,  Scheduled for surgery on 7/4.   Pt was on Xarelto 20 mg PO Q Supper ,   Last dose was on 7/2 @ 1800.  CrCl = 81.7 ml/min  7/3:  APTT = 26,  HL = 0.22  Goal of Therapy:  HL = 0.3 - 0.7  APTT = 66 - 102s  Monitor platelets by anticoagulation protocol: Yes   Plan:  Give 4000 units bolus x 1 Start heparin infusion at 1150 units/hr Check anti-Xa level in 6 hours and daily while on heparin Continue to monitor H&H and platelets   Will use aPTT to guide heparin dosing until HL and aPTT are both therapeutic.  Baseline aPTT and HL were both subtherapeutic so OK to use HL to guide dosing.   Elisha Cooksey D 02/25/2020,8:46 PM

## 2020-02-25 NOTE — Consult Note (Signed)
Full consult note to follow. Plan right hip hemiarthroplasty on Monday due to anticoagulation. Please hold Xarelto.

## 2020-02-25 NOTE — ED Notes (Signed)
MD at bedside. 

## 2020-02-26 DIAGNOSIS — S72001D Fracture of unspecified part of neck of right femur, subsequent encounter for closed fracture with routine healing: Secondary | ICD-10-CM

## 2020-02-26 LAB — BASIC METABOLIC PANEL
Anion gap: 4 — ABNORMAL LOW (ref 5–15)
BUN: 15 mg/dL (ref 6–20)
CO2: 30 mmol/L (ref 22–32)
Calcium: 8.4 mg/dL — ABNORMAL LOW (ref 8.9–10.3)
Chloride: 108 mmol/L (ref 98–111)
Creatinine, Ser: 0.83 mg/dL (ref 0.44–1.00)
GFR calc Af Amer: >60 mL/min (ref >60)
GFR calc non Af Amer: >60 mL/min (ref >60)
Glucose, Bld: 114 mg/dL — ABNORMAL HIGH (ref 70–99)
Potassium: 4.3 mmol/L (ref 3.5–5.1)
Sodium: 142 mmol/L (ref 135–145)

## 2020-02-26 LAB — CBC
HCT: 35.8 % — ABNORMAL LOW (ref 36.0–46.0)
HCT: 36.1 % (ref 36.0–46.0)
Hemoglobin: 11.3 g/dL — ABNORMAL LOW (ref 12.0–15.0)
Hemoglobin: 12.1 g/dL (ref 12.0–15.0)
MCH: 29.1 pg (ref 26.0–34.0)
MCH: 30.2 pg (ref 26.0–34.0)
MCHC: 31.6 g/dL (ref 30.0–36.0)
MCHC: 33.5 g/dL (ref 30.0–36.0)
MCV: 92.3 fL (ref 80.0–100.0)
MCV: 90 fL (ref 80.0–100.0)
Platelets: 298 10*3/uL (ref 150–400)
Platelets: 299 10*3/uL (ref 150–400)
RBC: 3.88 MIL/uL (ref 3.87–5.11)
RBC: 4.01 MIL/uL (ref 3.87–5.11)
RDW: 14.5 % (ref 11.5–15.5)
RDW: 14.6 % (ref 11.5–15.5)
WBC: 5.6 10*3/uL (ref 4.0–10.5)
WBC: 6 10*3/uL (ref 4.0–10.5)
nRBC: 0 % (ref 0.0–0.2)
nRBC: 0 % (ref 0.0–0.2)

## 2020-02-26 LAB — HEPARIN LEVEL (UNFRACTIONATED)
Heparin Unfractionated: 1.47 IU/mL — ABNORMAL HIGH (ref 0.30–0.70)
Heparin Unfractionated: 0.72 IU/mL — ABNORMAL HIGH (ref 0.30–0.70)

## 2020-02-26 LAB — MRSA PCR SCREENING: MRSA by PCR: NEGATIVE

## 2020-02-26 LAB — APTT
aPTT: >160 seconds (ref 24–36)
aPTT: 89 seconds — ABNORMAL HIGH (ref 24–36)
aPTT: 83 seconds — ABNORMAL HIGH (ref 24–36)

## 2020-02-26 LAB — HIV ANTIBODY (ROUTINE TESTING W REFLEX): HIV Screen 4th Generation wRfx: NONREACTIVE

## 2020-02-26 MED ORDER — CEFAZOLIN SODIUM-DEXTROSE 2-4 GM/100ML-% IV SOLN
2.0000 g | INTRAVENOUS | Status: AC
  Administered 2020-02-27: 2 g via INTRAVENOUS
  Filled 2020-02-26 (×3): qty 100

## 2020-02-26 MED ORDER — HEPARIN (PORCINE) 25000 UT/250ML-% IV SOLN
800.0000 [IU]/h | INTRAVENOUS | Status: AC
  Administered 2020-02-26 (×2): 800 [IU]/h via INTRAVENOUS
  Filled 2020-02-26: qty 250

## 2020-02-26 MED ORDER — TRANEXAMIC ACID 1000 MG/10ML IV SOLN
2000.0000 mg | INTRAVENOUS | Status: DC
  Filled 2020-02-26 (×3): qty 20

## 2020-02-26 NOTE — Progress Notes (Signed)
ANTICOAGULATION CONSULT NOTE - Initial Consult  Pharmacy Consult for Heparin  Indication: DVT  Allergies  Allergen Reactions   Tape Other (See Comments)    likely allergy to adhesive/ dermabond skin glue - blisters noted at edge of adhesives likely allergy to adhesive/ dermabond skin glue - blisters noted at edge of adhesives    Iodinated Diagnostic Agents Rash    On face    Toradol [Ketorolac Tromethamine] Itching and Rash   Tramadol Itching and Rash   Zofran [Ondansetron Hcl] Itching and Rash    Patient Measurements: Height: 5\' 2"  (157.5 cm) Weight: 82.1 kg (181 lb) IBW/kg (Calculated) : 50.1 Heparin Dosing Weight: 68.5   Vital Signs: Temp: 97.7 F (36.5 C) (07/04 2050) Temp Source: Oral (07/04 2050) BP: 121/77 (07/04 2050) Pulse Rate: 77 (07/04 2050)  Labs: Recent Labs    02/25/20 1034 02/25/20 1034 02/25/20 1834 02/26/20 0407 02/26/20 1300 02/26/20 1647 02/26/20 1958  HGB 14.5   < >  --  11.3*  --  12.1  --   HCT 44.6  --   --  35.8*  --  36.1  --   PLT 344  --   --  298  --  299  --   APTT 26   < >  --  >160* 89*  --  83*  LABPROT 12.0  --   --   --   --   --   --   INR 0.9  --   --   --   --   --   --   HEPARINUNFRC  --   --  0.22* 1.47* 0.72*  --   --   CREATININE 0.79  --   --  0.83  --   --   --    < > = values in this interval not displayed.    Estimated Creatinine Clearance: 78.7 mL/min (by C-G formula based on SCr of 0.83 mg/dL).   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 fib, EF nl, non-obs CAD; St. Jude ICD   Migraine    Obesity (BMI  30-39.9)    Seizures (HCC)    Ventricular tachycardia (HCC)     Medications:  Medications Prior to Admission  Medication Sig Dispense Refill Last Dose   acetaminophen (TYLENOL) 500 MG tablet Take by mouth.   Past Month at PRN   atorvastatin (LIPITOR) 20 MG tablet Take 1 tablet (20 mg total) by mouth daily at 6 PM. (Patient taking differently: Take 40 mg by mouth daily at 6 PM. ) 90 tablet 1 02/24/2020 at 1800   azelastine (ASTELIN) 0.1 % nasal spray 1 spray by Each Nare route Two (2) times a day. Use in each nostril as directed   Past Month at PRN   Calcium Carbonate-Vit D-Min (GNP CALCIUM 1200) 1200-1000 MG-UNIT CHEW Chew 1,200 mg by mouth daily with breakfast. Take in combination with vitamin D and magnesium. 90 tablet 3 02/24/2020 at 0900   cetirizine (ZYRTEC) 10 MG tablet Take  by mouth.   02/24/2020 at 0900   Cholecalciferol (VITAMIN D3) 125 MCG (5000 UT) TABS Take by mouth.   02/24/2020 at 0900   famotidine (PEPCID) 20 MG tablet Take 1 tablet (20 mg total) by mouth 2 (two) times daily. 60 tablet 1 02/24/2020 at 2000   fluticasone (FLOVENT HFA) 110 MCG/ACT inhaler Inhale into the lungs.   02/24/2020 at Unknown time   furosemide (LASIX) 20 MG tablet Take 20 mg by mouth daily and may take an additional daily as directed (Patient taking differently: Take 20 mg by mouth 2 (two) times daily. ) 90 tablet 3 02/24/2020 at 2000   gabapentin (NEURONTIN) 300 MG capsule Take 300 mg by mouth 3 (three) times daily.    02/24/2020 at 2000   hydrOXYzine (ATARAX/VISTARIL) 25 MG tablet Take 25 mg by mouth as needed.    02/24/2020 at 2000   levETIRAcetam (KEPPRA) 500 MG tablet Take by mouth.   02/24/2020 at 2000   Magnesium 400 MG TABS Take 400 mg by mouth daily.   02/24/2020 at 1800   metoprolol tartrate (LOPRESSOR) 25 MG tablet Take 0.5 tablets (12.5 mg total) by mouth 2 (two) times daily. (Patient taking differently: Take 25 mg by mouth 2 (two) times daily. ) 60 tablet 1 02/24/2020 at 2000   promethazine  (PHENERGAN) 12.5 MG tablet Take by mouth.   Past Week at PRN   rivaroxaban (XARELTO) 20 MG TABS tablet Take 20 mg by mouth daily with supper.   02/24/2020 at 1800   sertraline (ZOLOFT) 100 MG tablet TAKE (1) TABLET BY MOUTH EVERY DAY   02/24/2020 at 0800   topiramate (TOPAMAX) 100 MG tablet Take 1 tablet (100 mg total) by mouth 2 (two) times daily. 60 tablet 1 02/24/2020 at 2000   triamcinolone (KENALOG) 0.025 % ointment APPLY TOPICALLY TWICE DAILY AS NEEDED FOR ITCH. YOU CAN ALSO MIX WITHAQUAPHOROR VASELINE IN A 1:1 RATIO. AVOID FACE AND SKIN FOLDS   Past Week at PRN   albuterol (PROVENTIL HFA;VENTOLIN HFA) 108 (90 BASE) MCG/ACT inhaler Inhale 2 puffs into the lungs every 6 (six) hours as needed for wheezing or shortness of breath. 1 Inhaler 0     Assessment: Pharmacy consulted to dose heparin in this 54 year old female admitted with DVT,  Scheduled for surgery on 7/4.   Pt was on Xarelto 20 mg PO Q Supper ,   Last dose was on 7/2 @ 1800. Hgb dropped from 14 to 11 - MD [facility]) aware.   6/7:5449 APTT 26 HL 0.22 7/4 0407 aPTT > 160 HL 0.72 heparin held and infusion decreased.  7/4:1300 aPTT 89 HL 0.72.   Goal of Therapy:  HL = 0.3 - 0.7  APTT = 66 - 102s  Monitor platelets by anticoagulation protocol: Yes   Plan:  APTT is therapeutic. Will continue current rate at 800 units/hr. Will order aPTT in 6 hours and anti-xa and CBC with AM labs. Will switch to anti-xa level from aPTT once aPTT and anti-xa correlate.   7/4:  APTT @ 1958 = 83 APTT therapeutic X 2  Will continue pt on current rate and recheck aPTT and HL on 7/5 @ 0500.   [place] Pharmacist 02/26/2020,8:51 PM

## 2020-02-26 NOTE — Progress Notes (Addendum)
ANTICOAGULATION CONSULT NOTE - Initial Consult  Pharmacy Consult for Heparin  Indication: DVT  Allergies  Allergen Reactions  . Tape Other (See Comments)    likely allergy to adhesive/ dermabond skin glue - blisters noted at edge of adhesives likely allergy to adhesive/ dermabond skin glue - blisters noted at edge of adhesives   . Iodinated Diagnostic Agents Rash    On face   . Toradol [Ketorolac Tromethamine] Itching and Rash  . Tramadol Itching and Rash  . Zofran [Ondansetron Hcl] Itching and Rash    Patient Measurements: Height: 5\' 2"  (157.5 cm) Weight: 82.1 kg (181 lb) IBW/kg (Calculated) : 50.1 Heparin Dosing Weight: 68.5   Vital Signs: Temp: 97.6 F (36.4 C) (07/04 0730) BP: 112/71 (07/04 0730) Pulse Rate: 63 (07/04 0730)  Labs: Recent Labs    02/25/20 1034 02/25/20 1834 02/26/20 0407 02/26/20 1300  HGB 14.5  --  11.3*  --   HCT 44.6  --  35.8*  --   PLT 344  --  298  --   APTT 26  --  >160* 89*  LABPROT 12.0  --   --   --   INR 0.9  --   --   --   HEPARINUNFRC  --  0.22* 1.47* 0.72*  CREATININE 0.79  --  0.83  --     Estimated Creatinine Clearance: 78.7 mL/min (by C-G formula based on SCr of 0.83 mg/dL).   Medical History: Past Medical History:  Diagnosis Date  . Allergy   . Anxiety   . Asthma 05/06/2016  . Coronary artery disease   . DDD (degenerative disc disease), cervical   . Depression   . Diverticulosis    seen on CT 2015  . DVT (deep venous thrombosis) ([place])   . GERD (gastroesophageal reflux disease)   . Heart failure ([place])   . High cholesterol   . Hyperlipidemia   . Hypertension   . 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 fib, EF nl, non-obs CAD; St. Jude ICD  . Migraine   . Obesity (BMI 30-39.9)   . Seizures ([place])   . Ventricular tachycardia (HCC)     Medications:  Medications Prior to Admission  Medication Sig Dispense Refill Last Dose  . acetaminophen (TYLENOL) 500 MG tablet Take by mouth.   Past Month at PRN  .  atorvastatin (LIPITOR) 20 MG tablet Take 1 tablet (20 mg total) by mouth daily at 6 PM. (Patient taking differently: Take 40 mg by mouth daily at 6 PM. ) 90 tablet 1 02/24/2020 at 1800  . azelastine (ASTELIN) 0.1 % nasal spray 1 spray by Each Nare route Two (2) times a day. Use in each nostril as directed   Past Month at PRN  . Calcium Carbonate-Vit D-Min (GNP CALCIUM 1200) 1200-1000 MG-UNIT CHEW Chew 1,200 mg by mouth daily with breakfast. Take in combination with vitamin D and magnesium. 90 tablet 3 02/24/2020 at 0900  . cetirizine (ZYRTEC) 10 MG tablet Take by mouth.   02/24/2020 at 0900  . Cholecalciferol (VITAMIN D3) 125 MCG (5000 UT) TABS Take by mouth.   02/24/2020 at 0900  . famotidine (PEPCID) 20 MG tablet Take 1 tablet (20 mg total) by mouth 2 (two) times daily. 60 tablet 1 02/24/2020 at 2000  . fluticasone (FLOVENT HFA) 110 MCG/ACT inhaler Inhale into the lungs.   02/24/2020 at Unknown time  . furosemide (LASIX) 20 MG tablet Take 20 mg by mouth daily and may take an additional daily as  directed (Patient taking differently: Take 20 mg by mouth 2 (two) times daily. ) 90 tablet 3 02/24/2020 at 2000  . gabapentin (NEURONTIN) 300 MG capsule Take 300 mg by mouth 3 (three) times daily.    02/24/2020 at 2000  . hydrOXYzine (ATARAX/VISTARIL) 25 MG tablet Take 25 mg by mouth as needed.    02/24/2020 at 2000  . levETIRAcetam (KEPPRA) 500 MG tablet Take by mouth.   02/24/2020 at 2000  . Magnesium 400 MG TABS Take 400 mg by mouth daily.   02/24/2020 at 1800  . metoprolol tartrate (LOPRESSOR) 25 MG tablet Take 0.5 tablets (12.5 mg total) by mouth 2 (two) times daily. (Patient taking differently: Take 25 mg by mouth 2 (two) times daily. ) 60 tablet 1 02/24/2020 at 2000  . promethazine (PHENERGAN) 12.5 MG tablet Take by mouth.   Past Week at PRN  . rivaroxaban (XARELTO) 20 MG TABS tablet Take 20 mg by mouth daily with supper.   02/24/2020 at 1800  . sertraline (ZOLOFT) 100 MG tablet TAKE (1) TABLET BY MOUTH EVERY DAY   02/24/2020 at  0800  . topiramate (TOPAMAX) 100 MG tablet Take 1 tablet (100 mg total) by mouth 2 (two) times daily. 60 tablet 1 02/24/2020 at 2000  . triamcinolone (KENALOG) 0.025 % ointment APPLY TOPICALLY TWICE DAILY AS NEEDED FOR ITCH. YOU CAN ALSO MIX WITHAQUAPHOROR VASELINE IN A 1:1 RATIO. AVOID FACE AND SKIN FOLDS   Past Week at PRN  . albuterol (PROVENTIL HFA;VENTOLIN HFA) 108 (90 BASE) MCG/ACT inhaler Inhale 2 puffs into the lungs every 6 (six) hours as needed for wheezing or shortness of breath. 1 Inhaler 0     Assessment: Pharmacy consulted to dose heparin in this 54 year old female admitted with DVT,  Scheduled for surgery on 7/4.   Pt was on Xarelto 20 mg PO Q Supper ,   Last dose was on 7/2 @ 1800. Hgb dropped from 14 to 11 - MD [facility]) aware.   3/3:8329 APTT 26 HL 0.22 7/4 0407 aPTT > 160 HL 0.72 heparin held and infusion decreased.  7/4:1300 aPTT 89 HL 0.72.   Goal of Therapy:  HL = 0.3 - 0.7  APTT = 66 - 102s  Monitor platelets by anticoagulation protocol: Yes   Plan:  APTT is therapeutic. Will continue current rate at 800 units/hr. Will order aPTT in 6 hours and anti-xa and CBC with AM labs. Will switch to anti-xa level from aPTT once aPTT and anti-xa correlate.    Eleonore Chiquito, PharmD, BCPS Clinical Pharmacist 02/26/2020,1:47 PM

## 2020-02-26 NOTE — Consult Note (Signed)
ORTHOPAEDIC CONSULTATION  REQUESTING PHYSICIAN: Modena Jansky, MD  Chief Complaint: right hip pain  HPI: Claire Mcdonald is a 54 y.o. female who complains of right hip pain after fall. Please see H&P and ED notes for details. Denies any numbness, tingling or constitutional symptoms.  Past Medical History:  Diagnosis Date  . Allergy   . Anxiety   . Asthma 05/06/2016  . Coronary artery disease   . DDD (degenerative disc disease), cervical   . Depression   . Diverticulosis    seen on CT 2015  . DVT (deep venous thrombosis) ([place])   . GERD (gastroesophageal reflux disease)   . Heart failure ([place])   . High cholesterol   . Hyperlipidemia   . Hypertension   . 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 fib, EF nl, non-obs CAD; St. Jude ICD  . Migraine   . Obesity (BMI 30-39.9)   . Seizures ([place])   . Ventricular tachycardia [facility])    Past Surgical History:  Procedure Laterality Date  . ABDOMINAL HYSTERECTOMY    . CARDIAC CATHETERIZATION N/A 03/27/2015   Procedure: Left Heart Cath and Coronary Angiography;  Surgeon: Troy Sine, MD;  LAD 10%/30%, CFX and RCA OK, EF normal  . CENTRAL LINE  03/25/2015      . COLONOSCOPY WITH PROPOFOL N/A 10/01/2015   Procedure: COLONOSCOPY WITH PROPOFOL;  Surgeon: Josefine Class, MD;  Location: [facility] ENDOSCOPY;  Service: Endoscopy;  Laterality: N/A;  . EP IMPLANTABLE DEVICE N/A 04/11/2015   Procedure: ICD Implant;  Surgeon: Deboraha Sprang, MD;  [facility] ICD, serial number [id number]  . EP IMPLANTABLE DEVICE N/A 07/16/2015   Procedure: Lead Revision/Repair;  Surgeon: Will Meredith Leeds, MD;  Location: [place] LAB;  Service: Cardiovascular;  Laterality: N/A;  . ESOPHAGOGASTRODUODENOSCOPY (EGD) WITH PROPOFOL N/A 10/01/2015   Procedure: ESOPHAGOGASTRODUODENOSCOPY (EGD) WITH PROPOFOL;  Surgeon: Josefine Class, MD;  Location: [facility] ENDOSCOPY;  Service: Endoscopy;  Laterality: N/A;  . HAND SURGERY Left   . OVARIAN CYST REMOVAL Left   . TUBAL  LIGATION  1998   Social History   Socioeconomic History  . Marital status: Single    Spouse name: Not on file  . Number of children: Not on file  . Years of education: Not on file  . Highest education level: Not on file  Occupational History  . Not on file  Tobacco Use  . Smoking status: Never Smoker  . Smokeless tobacco: Never Used  Vaping Use  . Vaping Use: Never used  Substance and Sexual Activity  . Alcohol use: No  . Drug use: No  . Sexual activity: Yes    Birth control/protection: Surgical  Other Topics Concern  . Not on file  Social History Narrative   Lives in [place], [place].   Social Determinants of Health   Financial Resource Strain:   . Difficulty of Paying Living Expenses:   Food Insecurity:   . Worried About Charity fundraiser in the Last Year:   . Arboriculturist in the Last Year:   Transportation Needs:   . Film/video editor (Medical):   Marland Kitchen Lack of Transportation (Non-Medical):   Physical Activity:   . Days of Exercise per Week:   . Minutes of Exercise per Session:   Stress:   . Feeling of Stress :   Social Connections:   . Frequency of Communication with Friends and Family:   . Frequency of Social Gatherings with Friends and Family:   .  Attends Religious Services:   . Active Member of Clubs or Organizations:   . Attends Archivist Meetings:   Marland Kitchen Marital Status:    Family History  Problem Relation Age of Onset  . Diabetes Father   . Asthma Father   . Hyperlipidemia Father   . Hypertension Father   . Kidney disease Father   . Asthma Mother   . Depression Mother   . Hyperlipidemia Mother   . Hypertension Mother   . Cancer Maternal Grandmother   . Early death Maternal Grandmother   . Breast cancer Maternal Grandmother   . Early death Paternal Grandmother   . Allergies Daughter   . Allergies Son   . Allergies Son   . Heart disease Son   . Breast cancer Paternal Aunt   . Stroke Neg Hx    Allergies  Allergen Reactions  .  Tape Other (See Comments)    likely allergy to adhesive/ dermabond skin glue - blisters noted at edge of adhesives likely allergy to adhesive/ dermabond skin glue - blisters noted at edge of adhesives   . Iodinated Diagnostic Agents Rash    On face   . Toradol [Ketorolac Tromethamine] Itching and Rash  . Tramadol Itching and Rash  . Zofran [Ondansetron Hcl] Itching and Rash   Prior to Admission medications   Medication Sig Start Date End Date Taking? Authorizing Provider  acetaminophen (TYLENOL) 500 MG tablet Take by mouth. 11/21/18  Yes [provider]  atorvastatin (LIPITOR) 20 MG tablet Take 1 tablet (20 mg total) by mouth daily at 6 PM. Patient taking differently: Take 40 mg by mouth daily at 6 PM.  07/06/15  Yes [name], NP  azelastine (ASTELIN) 0.1 % nasal spray 1 spray by Each Nare route Two (2) times a day. Use in each nostril as directed 12/01/19 11/30/20 Yes [provider]  Calcium Carbonate-Vit D-Min (GNP CALCIUM 1200) 1200-1000 MG-UNIT CHEW Chew 1,200 mg by mouth daily with breakfast. Take in combination with vitamin D and magnesium. 06/13/19 06/12/20 Yes Milinda Pointer, MD  cetirizine (ZYRTEC) 10 MG tablet Take by mouth. 08/23/18 02/25/20 Yes [provider]  Cholecalciferol (VITAMIN D3) 125 MCG (5000 UT) TABS Take by mouth. 11/23/18  Yes [provider]  famotidine (PEPCID) 20 MG tablet Take 1 tablet (20 mg total) by mouth 2 (two) times daily. 03/27/19 03/26/20 Yes Nance Pear, MD  fluticasone (FLOVENT HFA) 110 MCG/ACT inhaler Inhale into the lungs. 05/22/17 02/25/20 Yes [provider]  furosemide (LASIX) 20 MG tablet Take 20 mg by mouth daily and may take an additional daily as directed Patient taking differently: Take 20 mg by mouth 2 (two) times daily.  09/21/15  Yes Deboraha Sprang, MD  gabapentin (NEURONTIN) 300 MG capsule Take 300 mg by mouth 3 (three) times daily.    Yes [provider]  hydrOXYzine (ATARAX/VISTARIL)  25 MG tablet Take 25 mg by mouth as needed.  01/14/19  Yes [provider]  levETIRAcetam (KEPPRA) 500 MG tablet Take by mouth. 12/08/18  Yes [provider]  Magnesium 400 MG TABS Take 400 mg by mouth daily.   Yes [provider]  metoprolol tartrate (LOPRESSOR) 25 MG tablet Take 0.5 tablets (12.5 mg total) by mouth 2 (two) times daily. Patient taking differently: Take 25 mg by mouth 2 (two) times daily.  08/03/15  Yes [name], NP  promethazine (PHENERGAN) 12.5 MG tablet Take by mouth. 12/24/18  Yes [provider]  rivaroxaban (XARELTO) 20 MG  TABS tablet Take 20 mg by mouth daily with supper.   Yes [provider]  sertraline (ZOLOFT) 100 MG tablet TAKE (1) TABLET BY MOUTH EVERY DAY 02/23/20  Yes [provider]  topiramate (TOPAMAX) 100 MG tablet Take 1 tablet (100 mg total) by mouth 2 (two) times daily. 04/12/15  Yes Buriev, Arie Sabina, MD  triamcinolone (KENALOG) 0.025 % ointment APPLY TOPICALLY TWICE DAILY AS NEEDED FOR ITCH. YOU CAN ALSO MIX WITHAQUAPHOROR VASELINE IN A 1:1 RATIO. AVOID FACE AND SKIN FOLDS 01/03/19  Yes [provider]  albuterol (PROVENTIL HFA;VENTOLIN HFA) 108 (90 BASE) MCG/ACT inhaler Inhale 2 puffs into the lungs every 6 (six) hours as needed for wheezing or shortness of breath. 01/23/15   Menshew, Dannielle Karvonen, PA-C   CT Head Wo Contrast  Result Date: 02/25/2020 CLINICAL DATA:  Status post fall with neck pain EXAM: CT HEAD WITHOUT CONTRAST CT CERVICAL SPINE WITHOUT CONTRAST TECHNIQUE: Multidetector CT imaging of the head and cervical spine was performed following the standard protocol without intravenous contrast. Multiplanar CT image reconstructions of the cervical spine were also generated. COMPARISON:  Cervical spine CT October 27, 2012 FINDINGS: CT HEAD FINDINGS Brain: No evidence of acute infarction, hemorrhage, hydrocephalus, extra-axial collection or mass lesion/mass effect. There is chronic diffuse atrophy.  Vascular: No hyperdense vessel is noted. Skull: Normal. Negative for fracture or focal lesion. Sinuses/Orbits: No acute finding. Other: None. CT CERVICAL SPINE FINDINGS Alignment: There is straightening of cervical spine probably due to muscle spasm or positioning. Skull base and vertebrae: No acute fracture. No primary bone lesion or focal pathologic process. Soft tissues and spinal canal: No prevertebral fluid or swelling. No visible canal hematoma. Disc levels: Minimal decreased intervertebral spaces are identified in the mid to lower cervical spine. No significant osteophytosis are noted. Upper chest: Negative. Other: None. IMPRESSION: 1. No focal acute intracranial abnormality identified. 2. No acute fracture or dislocation of cervical spine. 3. Straightening of cervical spine probably due to muscle spasm or positioning. Electronically Signed   By: Abelardo Diesel M.D.   On: 02/25/2020 11:08   CT Cervical Spine Wo Contrast  Result Date: 02/25/2020 CLINICAL DATA:  Status post fall with neck pain EXAM: CT HEAD WITHOUT CONTRAST CT CERVICAL SPINE WITHOUT CONTRAST TECHNIQUE: Multidetector CT imaging of the head and cervical spine was performed following the standard protocol without intravenous contrast. Multiplanar CT image reconstructions of the cervical spine were also generated. COMPARISON:  Cervical spine CT October 27, 2012 FINDINGS: CT HEAD FINDINGS Brain: No evidence of acute infarction, hemorrhage, hydrocephalus, extra-axial collection or mass lesion/mass effect. There is chronic diffuse atrophy. Vascular: No hyperdense vessel is noted. Skull: Normal. Negative for fracture or focal lesion. Sinuses/Orbits: No acute finding. Other: None. CT CERVICAL SPINE FINDINGS Alignment: There is straightening of cervical spine probably due to muscle spasm or positioning. Skull base and vertebrae: No acute fracture. No primary bone lesion or focal pathologic process. Soft tissues and spinal canal: No prevertebral fluid or  swelling. No visible canal hematoma. Disc levels: Minimal decreased intervertebral spaces are identified in the mid to lower cervical spine. No significant osteophytosis are noted. Upper chest: Negative. Other: None. IMPRESSION: 1. No focal acute intracranial abnormality identified. 2. No acute fracture or dislocation of cervical spine. 3. Straightening of cervical spine probably due to muscle spasm or positioning. Electronically Signed   By: Abelardo Diesel M.D.   On: 02/25/2020 11:08   CT Lumbar Spine Wo Contrast  Result Date: 02/25/2020 CLINICAL DATA:  54 year old female  status post fall this morning. Pain. EXAM: CT LUMBAR SPINE WITHOUT CONTRAST TECHNIQUE: Multidetector CT imaging of the lumbar spine was performed without intravenous contrast administration. Multiplanar CT image reconstructions were also generated. COMPARISON:  CT Pelvis today reported separately. CT lumbar myelogram 06/16/2019. FINDINGS: Segmentation: Normal, the same numbering system used on the prior CT myelogram. Alignment: Stable lumbar lordosis. Stable to mildly reduced chronic anterolisthesis of L5 on S1 following surgery since October. Vertebrae: Postoperative details are below. Stable lumbar vertebral height and alignment. Visible lower thoracic levels appear intact. No acute osseous abnormality identified. Visible sacrum and SI joints appear intact. Paraspinal and other soft tissues: Negative noncontrast abdominal viscera. Diverticulosis in the visible distal colon. Pelvic viscera reported separately today. Mild postoperative changes to the lower lumbar paraspinal soft tissues with no adverse features. Disc levels: T11-T12: Mild vacuum disc and endplate spurring but no evidence of stenosis. T12-L1:  Negative. L1-L2:  Subtle retrolisthesis and disc bulge.  No stenosis. L2-L3:  Negative. L3-L4: Increased epidural lipomatosis since October which partially effaces the thecal sac on series 9, image 73. Mild facet hypertrophy appears stable.  Otherwise negative. L4-L5: Interval posterior and interbody fusion hardware placement. Evidence of developing interbody arthrodesis. Chronic posterior element hypertrophy, no definite posterior element arthrodesis at this time. No evidence of hardware loosening. Residual endplate spurring. No spinal stenosis suspected. However, there appears to be a small partially calcified disc fragment or less likely endplate fragment in the left L4 neural foramen best seen on series 7, image 48. This is not well correlated on axial images. L5-S1: Interval posterior and anterior fusion hardware placement with stable to regressed chronic spondylolisthesis at this level. Evidence of developing interbody arthrodesis. No hardware loosening. Chronic posterior element hypertrophy, no definite posterior element arthrodesis at this time. Bilateral L5 foraminal patency appears improved, with no spinal stenosis suspected. IMPRESSION: 1. No acute traumatic injury identified. 2. Posterior and interbody fusion since October at L4-L5 and L5-S1 with developing interbody arthrodesis suspected at both levels. No hardware loosening, and no spinal stenosis. However, possible disc or disc osteophyte fragment in the left L4 neural foramen which is new from October. Query left L4 radiculitis. 3. Increased epidural lipomatosis at L3-L4 with new partial effacement the thecal sac. But no other adjacent segment disease. 4. CT Pelvis today reported separately. Electronically Signed   By: Genevie Ann M.D.   On: 02/25/2020 11:12   CT PELVIS WO CONTRAST  Result Date: 02/25/2020 CLINICAL DATA:  Pelvic fracture. EXAM: CT PELVIS WITHOUT CONTRAST TECHNIQUE: Multidetector CT imaging of the pelvis was performed following the standard protocol without intravenous contrast. COMPARISON:  02/19/2017 FINDINGS: Urinary Tract: The bladder and visualized portion of the urethra are normal. Distal ureters are unremarkable. Bowel: There is significant diverticular disease of  the descending and sigmoid colon. No associated inflammatory changes to suggest acute diverticulitis. Postoperative changes in the region of the cecum. The appendix is not seen. Regional small bowel loops are unremarkable. Vascular/Lymphatic: Normal noncontrast appearance of the LOWER aorta and other vascular structures. Reproductive: Hysterectomy. Within the LEFT LOWER QUADRANT there is BB a fluid attenuation structure measuring 5.2 x 3.4 x 4.8 centimeters. Just inferior to this, there is a low-attenuation 1.5 centimeter mass or lymph node in the LEFT external iliac chain. Postoperative changes are identified in the Other: No ascites.  Anterior abdominal wall is unremarkable. Musculoskeletal: There is an acute displaced fracture of the RIGHT subcapital femoral neck, associated with foreshortening and overriding of fracture fragments. The femoral head is located in  the acetabulum. No acute pelvic fractures. Mild irregularity identified in the RIGHT superior pubic ramus appears chronic. There is mild degenerative change in the SI joints. Remote LOWER lumbar-sacral fusion. IMPRESSION: 1. Acute displaced fracture of the RIGHT subcapital femoral neck. 2. No acute pelvic fractures. 3. Colonic diverticulosis. 4. Hysterectomy. 5. 5.2 x 3.4 x 4.8 centimeters fluid attenuation structure in the LEFT LOWER QUADRANT, possibly representing a seroma or lymphocele. Less likely the findings could be related to ovarian mass. Recommend further evaluation with elective CT of the abdomen and pelvis with intravenous contrast. 6. 1.5 centimeter low-attenuation mass or lymph node in the LEFT external iliac chain. 7. Remote LOWER lumbar-sacral fusion. Electronically Signed   By: Nolon Nations M.D.   On: 02/25/2020 11:16   DG Chest Portable 1 View  Result Date: 02/25/2020 CLINICAL DATA:  Preop for hip fracture post fall. EXAM: PORTABLE CHEST 1 VIEW COMPARISON:  02/11/2017 FINDINGS: Left-sided pacemaker unchanged. Lungs are adequately  inflated and otherwise clear. Remainder the exam is unchanged. IMPRESSION: No active disease. Electronically Signed   By: Marin Olp M.D.   On: 02/25/2020 10:21   DG Hip Unilat W or Wo Pelvis 2-3 Views Right  Result Date: 02/25/2020 CLINICAL DATA:  Preop right hip fracture post fall. EXAM: DG HIP (WITH OR WITHOUT PELVIS) 2-3V RIGHT COMPARISON:  CT 02/11/2017 FINDINGS: Exam demonstrates a moderately displaced subcapital fracture of the right femoral neck with superior displacement of the distal fragment. Mild degenerative changes of the hips, sacroiliac joints and spine. Fusion hardware intact over the lumbosacral spine. Mild irregularity over the right superior pubic ramus and right side of the symphysis likely due to old injury although acute fracture is possible. Remainder the exam is unchanged. IMPRESSION: 1. Moderately displaced subcapital fracture of the right femoral neck. 2. Mild irregularity of the right superior pubic ramus and right side of the symphysis likely due to old injury and much less likely acute fracture. 3. Mild degenerative changes of the hips, sacroiliac joints and spine. Fusion hardware over the lumbosacral spine intact. Electronically Signed   By: Marin Olp M.D.   On: 02/25/2020 10:20    Positive ROS: All other systems have been reviewed and were otherwise negative with the exception of those mentioned in the HPI and as above.  Physical Exam: General: Alert, no acute distress Cardiovascular: No pedal edema Respiratory: No cyanosis, no use of accessory musculature GI: No organomegaly, abdomen is soft and non-tender Skin: No lesions in the area of chief complaint Neurologic: Sensation intact distally Psychiatric: Patient is competent for consent with normal mood and affect Lymphatic: No axillary or cervical lymphadenopathy  MUSCULOSKELETAL: right leg short, externally rotated. Compartments soft. Good cap refill. Motor and sensory intact distally. LLE FROM,  non-tender  Assessment: Right femoral neck fracture, closed, displaced  Plan: Plan a right hip hemiarthroplasty tomorrow, anterior approach. Please hold Heparin this evening for 8 am surgery.  The diagnosis, risks, benefits and alternatives to treatment are all discussed in detail with the patient and family. Risks include but are not limited to bleeding, infection, deep vein thrombosis, pulmonary embolism, nerve or vascular injury, non-union, repeat operation, persistent pain, weakness, stiffness and death. She understands and is eager to proceed.     Lovell Sheehan, MD    02/26/2020 10:01 AM

## 2020-02-26 NOTE — Progress Notes (Addendum)
ANTICOAGULATION CONSULT NOTE - Initial Consult  Pharmacy Consult for Heparin  Indication: DVT  Allergies  Allergen Reactions  . Tape Other (See Comments)    likely allergy to adhesive/ dermabond skin glue - blisters noted at edge of adhesives likely allergy to adhesive/ dermabond skin glue - blisters noted at edge of adhesives   . Iodinated Diagnostic Agents Rash    On face   . Toradol [Ketorolac Tromethamine] Itching and Rash  . Tramadol Itching and Rash  . Zofran [Ondansetron Hcl] Itching and Rash    Patient Measurements: Height: 5\' 2"  (157.5 cm) Weight: 82.1 kg (181 lb) IBW/kg (Calculated) : 50.1 Heparin Dosing Weight: 68.5   Vital Signs:    Labs: Recent Labs    02/25/20 1034 02/25/20 1834 02/26/20 0407  HGB 14.5  --  11.3*  HCT 44.6  --  35.8*  PLT 344  --  298  APTT 26  --   --   LABPROT 12.0  --   --   INR 0.9  --   --   HEPARINUNFRC  --  0.22* 1.47*  CREATININE 0.79  --  0.83    Estimated Creatinine Clearance: 78.7 mL/min (by C-G formula based on SCr of 0.83 mg/dL).   Medical History: Past Medical History:  Diagnosis Date  . Allergy   . Anxiety   . Asthma 05/06/2016  . Coronary artery disease   . DDD (degenerative disc disease), cervical   . Depression   . Diverticulosis    seen on CT 2015  . DVT (deep venous thrombosis) ([place])   . GERD (gastroesophageal reflux disease)   . Heart failure ([place])   . High cholesterol   . Hyperlipidemia   . Hypertension   . 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 fib, EF nl, non-obs CAD; St. Jude ICD  . Migraine   . Obesity (BMI 30-39.9)   . Seizures ([place])   . Ventricular tachycardia ([facility])     Medications:  Medications Prior to Admission  Medication Sig Dispense Refill Last Dose  . acetaminophen (TYLENOL) 500 MG tablet Take by mouth.   Past Month at PRN  . atorvastatin (LIPITOR) 20 MG tablet Take 1 tablet (20 mg total) by mouth daily at 6 PM. (Patient taking differently: Take 40 mg by mouth daily at 6  PM. ) 90 tablet 1 02/24/2020 at 1800  . azelastine (ASTELIN) 0.1 % nasal spray 1 spray by Each Nare route Two (2) times a day. Use in each nostril as directed   Past Month at PRN  . Calcium Carbonate-Vit D-Min (GNP CALCIUM 1200) 1200-1000 MG-UNIT CHEW Chew 1,200 mg by mouth daily with breakfast. Take in combination with vitamin D and magnesium. 90 tablet 3 02/24/2020 at 0900  . cetirizine (ZYRTEC) 10 MG tablet Take by mouth.   02/24/2020 at 0900  . Cholecalciferol (VITAMIN D3) 125 MCG (5000 UT) TABS Take by mouth.   02/24/2020 at 0900  . famotidine (PEPCID) 20 MG tablet Take 1 tablet (20 mg total) by mouth 2 (two) times daily. 60 tablet 1 02/24/2020 at 2000  . fluticasone (FLOVENT HFA) 110 MCG/ACT inhaler Inhale into the lungs.   02/24/2020 at Unknown time  . furosemide (LASIX) 20 MG tablet Take 20 mg by mouth daily and may take an additional daily as directed (Patient taking differently: Take 20 mg by mouth 2 (two) times daily. ) 90 tablet 3 02/24/2020 at 2000  . gabapentin (NEURONTIN) 300 MG capsule Take 300 mg by mouth 3 (three)  times daily.    02/24/2020 at 2000  . hydrOXYzine (ATARAX/VISTARIL) 25 MG tablet Take 25 mg by mouth as needed.    02/24/2020 at 2000  . levETIRAcetam (KEPPRA) 500 MG tablet Take by mouth.   02/24/2020 at 2000  . Magnesium 400 MG TABS Take 400 mg by mouth daily.   02/24/2020 at 1800  . metoprolol tartrate (LOPRESSOR) 25 MG tablet Take 0.5 tablets (12.5 mg total) by mouth 2 (two) times daily. (Patient taking differently: Take 25 mg by mouth 2 (two) times daily. ) 60 tablet 1 02/24/2020 at 2000  . promethazine (PHENERGAN) 12.5 MG tablet Take by mouth.   Past Week at PRN  . rivaroxaban (XARELTO) 20 MG TABS tablet Take 20 mg by mouth daily with supper.   02/24/2020 at 1800  . sertraline (ZOLOFT) 100 MG tablet TAKE (1) TABLET BY MOUTH EVERY DAY   02/24/2020 at 0800  . topiramate (TOPAMAX) 100 MG tablet Take 1 tablet (100 mg total) by mouth 2 (two) times daily. 60 tablet 1 02/24/2020 at 2000  .  triamcinolone (KENALOG) 0.025 % ointment APPLY TOPICALLY TWICE DAILY AS NEEDED FOR ITCH. YOU CAN ALSO MIX WITHAQUAPHOROR VASELINE IN A 1:1 RATIO. AVOID FACE AND SKIN FOLDS   Past Week at PRN  . albuterol (PROVENTIL HFA;VENTOLIN HFA) 108 (90 BASE) MCG/ACT inhaler Inhale 2 puffs into the lungs every 6 (six) hours as needed for wheezing or shortness of breath. 1 Inhaler 0     Assessment: Pharmacy consulted to dose heparin in this 54 year old female admitted with DVT,  Scheduled for surgery on 7/4.   Pt was on Xarelto 20 mg PO Q Supper ,   Last dose was on 7/2 @ 1800.  CrCl = 81.7 ml/min  7/3:  APTT = 26,  HL = 0.22  Goal of Therapy:  HL = 0.3 - 0.7  APTT = 66 - 102s  Monitor platelets by anticoagulation protocol: Yes   Plan:  07/04 @ 0400 HL 1.47 supratherapeutic. Heparin drip was stopped and planned to restart at 0700 at a rate of 800 units/hr; HOWEVER, this drip was suppose to be dosed off aPTT, no aPTT level was ordered. Stat aPTT is currently ordered/in progress, will adjust rate based on that aPTT level since patient was on xarelto PTA.  07/04 @ 0700 aPTT > 160 seconds. Will continue w/ current plan and recheck aPTT at 1300, CBC trending down will continue to monitor.  Tobie Lords, PharmD, BCPS Clinical Pharmacist 02/26/2020,6:01 AM

## 2020-02-26 NOTE — Consult Note (Signed)
CARDIOLOGY CONSULT NOTE               Patient ID: Claire Mcdonald MRN: [id number] DOB/AGE: [birth year] 52 y.o.  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  Cardiology was consulted because of her previous cardiac history patient's had no recent cardiac issues AICD has been in place without any discharges placed in August 2016 followed by [facility] reportedly had cardiac cath in 2016 with no significant coronary artery disease preserved left ventricular function no significant cardiac work-up since patient is asymptomatic from a cardiac standpoint  Review of systems complete and found to be negative unless listed above     Past Medical History:  Diagnosis Date  . Allergy   . Anxiety   . Asthma 05/06/2016  . Coronary artery disease   . DDD (degenerative disc disease), cervical   . Depression   . Diverticulosis    seen on CT 2015  . DVT (deep venous thrombosis) ([place])   . GERD (gastroesophageal reflux disease)   . Heart failure ([place])   . High cholesterol   . Hyperlipidemia   . Hypertension   . Idiopathic cardiac arrest ([place]) 02/2015   V. fib, EF nl, non-obs CAD; [employer] ICD  . Migraine   . Obesity (BMI 30-39.9)   . Seizures ([place])   . Ventricular tachycardia [facility])     Past Surgical History:  Procedure Laterality Date   . ABDOMINAL HYSTERECTOMY    . CARDIAC CATHETERIZATION N/A 03/27/2015   Procedure: Left Heart Cath and Coronary Angiography;  Surgeon: Troy Sine, MD;  LAD 10%/30%, CFX and RCA OK, EF normal  . CENTRAL LINE  03/25/2015      . COLONOSCOPY WITH PROPOFOL N/A 10/01/2015   Procedure: COLONOSCOPY WITH PROPOFOL;  Surgeon: Josefine Class, MD;  Location: [facility] ENDOSCOPY;  Service: Endoscopy;  Laterality: N/A;  . EP IMPLANTABLE DEVICE N/A 04/11/2015   Procedure: ICD Implant;  Surgeon: Deboraha Sprang, MD;  [facility] ICD, serial number [id number]  . EP IMPLANTABLE DEVICE N/A 07/16/2015   Procedure: Lead Revision/Repair;  Surgeon: Will Meredith Leeds, MD;  Location: [facility] CV LAB;  Service: Cardiovascular;  Laterality: N/A;  . ESOPHAGOGASTRODUODENOSCOPY (EGD) WITH PROPOFOL N/A 10/01/2015   Procedure: ESOPHAGOGASTRODUODENOSCOPY (EGD) WITH PROPOFOL;  Surgeon: Josefine Class, MD;  Location: [facility] ENDOSCOPY;  Service: Endoscopy;  Laterality: N/A;  . HAND SURGERY Left   . OVARIAN CYST REMOVAL Left   . TUBAL LIGATION  1998    Medications Prior to Admission  Medication Sig Dispense Refill Last Dose  . acetaminophen (TYLENOL) 500 MG tablet Take by mouth.   Past Month at PRN  . atorvastatin (LIPITOR) 20 MG tablet Take 1 tablet (20 mg total) by mouth daily at 6 PM. (Patient taking differently: Take 40 mg by mouth daily at 6 PM. ) 90 tablet 1 02/24/2020 at 1800  .  azelastine (ASTELIN) 0.1 % nasal spray 1 spray by Each Nare route Two (2) times a day. Use in each nostril as directed   Past Month at PRN  . Calcium Carbonate-Vit D-Min (GNP CALCIUM 1200) 1200-1000 MG-UNIT CHEW Chew 1,200 mg by mouth daily with breakfast. Take in combination with vitamin D and magnesium. 90 tablet 3 02/24/2020 at 0900  . cetirizine (ZYRTEC) 10 MG tablet Take by mouth.   02/24/2020 at 0900  . Cholecalciferol (VITAMIN D3) 125 MCG (5000 UT) TABS Take by mouth.   02/24/2020 at 0900  . famotidine (PEPCID) 20 MG tablet Take 1 tablet (20 mg  total) by mouth 2 (two) times daily. 60 tablet 1 02/24/2020 at 2000  . fluticasone (FLOVENT HFA) 110 MCG/ACT inhaler Inhale into the lungs.   02/24/2020 at Unknown time  . furosemide (LASIX) 20 MG tablet Take 20 mg by mouth daily and may take an additional daily as directed (Patient taking differently: Take 20 mg by mouth 2 (two) times daily. ) 90 tablet 3 02/24/2020 at 2000  . gabapentin (NEURONTIN) 300 MG capsule Take 300 mg by mouth 3 (three) times daily.    02/24/2020 at 2000  . hydrOXYzine (ATARAX/VISTARIL) 25 MG tablet Take 25 mg by mouth as needed.    02/24/2020 at 2000  . levETIRAcetam (KEPPRA) 500 MG tablet Take by mouth.   02/24/2020 at 2000  . Magnesium 400 MG TABS Take 400 mg by mouth daily.   02/24/2020 at 1800  . metoprolol tartrate (LOPRESSOR) 25 MG tablet Take 0.5 tablets (12.5 mg total) by mouth 2 (two) times daily. (Patient taking differently: Take 25 mg by mouth 2 (two) times daily. ) 60 tablet 1 02/24/2020 at 2000  . promethazine (PHENERGAN) 12.5 MG tablet Take by mouth.   Past Week at PRN  . rivaroxaban (XARELTO) 20 MG TABS tablet Take 20 mg by mouth daily with supper.   02/24/2020 at 1800  . sertraline (ZOLOFT) 100 MG tablet TAKE (1) TABLET BY MOUTH EVERY DAY   02/24/2020 at 0800  . topiramate (TOPAMAX) 100 MG tablet Take 1 tablet (100 mg total) by mouth 2 (two) times daily. 60 tablet 1 02/24/2020 at 2000  . triamcinolone (KENALOG) 0.025 % ointment APPLY TOPICALLY TWICE DAILY AS NEEDED FOR ITCH. YOU CAN ALSO MIX WITHAQUAPHOROR VASELINE IN A 1:1 RATIO. AVOID FACE AND SKIN FOLDS   Past Week at PRN  . albuterol (PROVENTIL HFA;VENTOLIN HFA) 108 (90 BASE) MCG/ACT inhaler Inhale 2 puffs into the lungs every 6 (six) hours as needed for wheezing or shortness of breath. 1 Inhaler 0    Social History   Socioeconomic History  . Marital status: Single    Spouse name: Not on file  . Number of children: Not on file  . Years of education: Not on file  . Highest education level: Not on file  Occupational  History  . Not on file  Tobacco Use  . Smoking status: Never Smoker  . Smokeless tobacco: Never Used  Vaping Use  . Vaping Use: Never used  Substance and Sexual Activity  . Alcohol use: No  . Drug use: No  . Sexual activity: Yes    Birth control/protection: Surgical  Other Topics Concern  . Not on file  Social History Narrative   Lives in [place], [place].   Social Determinants of Health   Financial Resource Strain:   . Difficulty of Paying Living Expenses:   Food Insecurity:   . Worried About Charity fundraiser in the Last  Year:   . Ran Out of Food in the Last Year:   Transportation Needs:   . Film/video editor (Medical):   Marland Kitchen Lack of Transportation (Non-Medical):   Physical Activity:   . Days of Exercise per Week:   . Minutes of Exercise per Session:   Stress:   . Feeling of Stress :   Social Connections:   . Frequency of Communication with Friends and Family:   . Frequency of Social Gatherings with Friends and Family:   . Attends Religious Services:   . Active Member of Clubs or Organizations:   . Attends Archivist Meetings:   Marland Kitchen Marital Status:   Intimate Partner Violence:   . Fear of Current or Ex-Partner:   . Emotionally Abused:   Marland Kitchen Physically Abused:   . Sexually Abused:     Family History  Problem Relation Age of Onset  . Diabetes Father   . Asthma Father   . Hyperlipidemia Father   . Hypertension Father   . Kidney disease Father   . Asthma Mother   . Depression Mother   . Hyperlipidemia Mother   . Hypertension Mother   . Cancer Maternal Grandmother   . Early death Maternal Grandmother   . Breast cancer Maternal Grandmother   . Early death Paternal Grandmother   . Allergies Daughter   . Allergies Son   . Allergies Son   . Heart disease Son   . Breast cancer Paternal Aunt   . Stroke Neg Hx       Review of systems complete and found to be negative unless listed above      PHYSICAL EXAM  General: Well developed, well nourished,  in no acute distress HEENT:  Normocephalic and atramatic Neck:  No JVD.  Lungs: Clear bilaterally to auscultation and percussion. Heart: HRRR . Normal S1 and S2 without gallops or murmurs.  Abdomen: Bowel sounds are positive, abdomen soft and non-tender  Msk:  Back normal, normal gait. Normal strength and tone for age. Extremities: No clubbing, cyanosis or edema.   Neuro: Alert and oriented X 3. Psych:  Good affect, responds appropriately  Labs:   Lab Results  Component Value Date   WBC 5.6 02/26/2020   HGB 11.3 (L) 02/26/2020   HCT 35.8 (L) 02/26/2020   MCV 92.3 02/26/2020   PLT 298 02/26/2020    Recent Labs  Lab 02/25/20 1034 02/25/20 1034 02/26/20 0407  NA 142   < > 142  K 2.9*   < > 4.3  CL 105   < > 108  CO2 25   < > 30  BUN 17   < > 15  CREATININE 0.79   < > 0.83  CALCIUM 8.9   < > 8.4*  PROT 7.6  --   --   BILITOT 0.8  --   --   ALKPHOS 125  --   --   ALT 29  --   --   AST 28  --   --   GLUCOSE 96   < > 114*   < > = values in this interval not displayed.   Lab Results  Component Value Date   CKTOTAL 124 01/05/2013   CKMB < 0.5 (L) 01/05/2013   TROPONINI <0.03 02/19/2017    Lab Results  Component Value Date   CHOL 164 02/21/2017   CHOL 163 04/16/2015   Lab Results  Component Value Date   HDL 52 02/21/2017   Lab Results  Component Value  Date   LDLCALC 90 02/21/2017   Lab Results  Component Value Date   TRIG 110 02/21/2017   TRIG 102 04/16/2015   TRIG 93 03/28/2015   Lab Results  Component Value Date   CHOLHDL 3.2 02/21/2017   No results found for: LDLDIRECT    Radiology: CT Head Wo Contrast  Result Date: 02/25/2020 CLINICAL DATA:  Status post fall with neck pain EXAM: CT HEAD WITHOUT CONTRAST CT CERVICAL SPINE WITHOUT CONTRAST TECHNIQUE: Multidetector CT imaging of the head and cervical spine was performed following the standard protocol without intravenous contrast. Multiplanar CT image reconstructions of the cervical spine were also  generated. COMPARISON:  Cervical spine CT October 27, 2012 FINDINGS: CT HEAD FINDINGS Brain: No evidence of acute infarction, hemorrhage, hydrocephalus, extra-axial collection or mass lesion/mass effect. There is chronic diffuse atrophy. Vascular: No hyperdense vessel is noted. Skull: Normal. Negative for fracture or focal lesion. Sinuses/Orbits: No acute finding. Other: None. CT CERVICAL SPINE FINDINGS Alignment: There is straightening of cervical spine probably due to muscle spasm or positioning. Skull base and vertebrae: No acute fracture. No primary bone lesion or focal pathologic process. Soft tissues and spinal canal: No prevertebral fluid or swelling. No visible canal hematoma. Disc levels: Minimal decreased intervertebral spaces are identified in the mid to lower cervical spine. No significant osteophytosis are noted. Upper chest: Negative. Other: None. IMPRESSION: 1. No focal acute intracranial abnormality identified. 2. No acute fracture or dislocation of cervical spine. 3. Straightening of cervical spine probably due to muscle spasm or positioning. Electronically Signed   By: Abelardo Diesel M.D.   On: 02/25/2020 11:08   CT Cervical Spine Wo Contrast  Result Date: 02/25/2020 CLINICAL DATA:  Status post fall with neck pain EXAM: CT HEAD WITHOUT CONTRAST CT CERVICAL SPINE WITHOUT CONTRAST TECHNIQUE: Multidetector CT imaging of the head and cervical spine was performed following the standard protocol without intravenous contrast. Multiplanar CT image reconstructions of the cervical spine were also generated. COMPARISON:  Cervical spine CT October 27, 2012 FINDINGS: CT HEAD FINDINGS Brain: No evidence of acute infarction, hemorrhage, hydrocephalus, extra-axial collection or mass lesion/mass effect. There is chronic diffuse atrophy. Vascular: No hyperdense vessel is noted. Skull: Normal. Negative for fracture or focal lesion. Sinuses/Orbits: No acute finding. Other: None. CT CERVICAL SPINE FINDINGS Alignment: There  is straightening of cervical spine probably due to muscle spasm or positioning. Skull base and vertebrae: No acute fracture. No primary bone lesion or focal pathologic process. Soft tissues and spinal canal: No prevertebral fluid or swelling. No visible canal hematoma. Disc levels: Minimal decreased intervertebral spaces are identified in the mid to lower cervical spine. No significant osteophytosis are noted. Upper chest: Negative. Other: None. IMPRESSION: 1. No focal acute intracranial abnormality identified. 2. No acute fracture or dislocation of cervical spine. 3. Straightening of cervical spine probably due to muscle spasm or positioning. Electronically Signed   By: Abelardo Diesel M.D.   On: 02/25/2020 11:08   CT Lumbar Spine Wo Contrast  Result Date: 02/25/2020 CLINICAL DATA:  54 year old female status post fall this morning. Pain. EXAM: CT LUMBAR SPINE WITHOUT CONTRAST TECHNIQUE: Multidetector CT imaging of the lumbar spine was performed without intravenous contrast administration. Multiplanar CT image reconstructions were also generated. COMPARISON:  CT Pelvis today reported separately. CT lumbar myelogram 06/16/2019. FINDINGS: Segmentation: Normal, the same numbering system used on the prior CT myelogram. Alignment: Stable lumbar lordosis. Stable to mildly reduced chronic anterolisthesis of L5 on S1 following surgery since October. Vertebrae: Postoperative details are below.  Stable lumbar vertebral height and alignment. Visible lower thoracic levels appear intact. No acute osseous abnormality identified. Visible sacrum and SI joints appear intact. Paraspinal and other soft tissues: Negative noncontrast abdominal viscera. Diverticulosis in the visible distal colon. Pelvic viscera reported separately today. Mild postoperative changes to the lower lumbar paraspinal soft tissues with no adverse features. Disc levels: T11-T12: Mild vacuum disc and endplate spurring but no evidence of stenosis. T12-L1:   Negative. L1-L2:  Subtle retrolisthesis and disc bulge.  No stenosis. L2-L3:  Negative. L3-L4: Increased epidural lipomatosis since October which partially effaces the thecal sac on series 9, image 73. Mild facet hypertrophy appears stable. Otherwise negative. L4-L5: Interval posterior and interbody fusion hardware placement. Evidence of developing interbody arthrodesis. Chronic posterior element hypertrophy, no definite posterior element arthrodesis at this time. No evidence of hardware loosening. Residual endplate spurring. No spinal stenosis suspected. However, there appears to be a small partially calcified disc fragment or less likely endplate fragment in the left L4 neural foramen best seen on series 7, image 48. This is not well correlated on axial images. L5-S1: Interval posterior and anterior fusion hardware placement with stable to regressed chronic spondylolisthesis at this level. Evidence of developing interbody arthrodesis. No hardware loosening. Chronic posterior element hypertrophy, no definite posterior element arthrodesis at this time. Bilateral L5 foraminal patency appears improved, with no spinal stenosis suspected. IMPRESSION: 1. No acute traumatic injury identified. 2. Posterior and interbody fusion since October at L4-L5 and L5-S1 with developing interbody arthrodesis suspected at both levels. No hardware loosening, and no spinal stenosis. However, possible disc or disc osteophyte fragment in the left L4 neural foramen which is new from October. Query left L4 radiculitis. 3. Increased epidural lipomatosis at L3-L4 with new partial effacement the thecal sac. But no other adjacent segment disease. 4. CT Pelvis today reported separately. Electronically Signed   By: Genevie Ann M.D.   On: 02/25/2020 11:12   CT PELVIS WO CONTRAST  Result Date: 02/25/2020 CLINICAL DATA:  Pelvic fracture. EXAM: CT PELVIS WITHOUT CONTRAST TECHNIQUE: Multidetector CT imaging of the pelvis was performed following the  standard protocol without intravenous contrast. COMPARISON:  02/19/2017 FINDINGS: Urinary Tract: The bladder and visualized portion of the urethra are normal. Distal ureters are unremarkable. Bowel: There is significant diverticular disease of the descending and sigmoid colon. No associated inflammatory changes to suggest acute diverticulitis. Postoperative changes in the region of the cecum. The appendix is not seen. Regional small bowel loops are unremarkable. Vascular/Lymphatic: Normal noncontrast appearance of the LOWER aorta and other vascular structures. Reproductive: Hysterectomy. Within the LEFT LOWER QUADRANT there is BB a fluid attenuation structure measuring 5.2 x 3.4 x 4.8 centimeters. Just inferior to this, there is a low-attenuation 1.5 centimeter mass or lymph node in the LEFT external iliac chain. Postoperative changes are identified in the Other: No ascites.  Anterior abdominal wall is unremarkable. Musculoskeletal: There is an acute displaced fracture of the RIGHT subcapital femoral neck, associated with foreshortening and overriding of fracture fragments. The femoral head is located in the acetabulum. No acute pelvic fractures. Mild irregularity identified in the RIGHT superior pubic ramus appears chronic. There is mild degenerative change in the SI joints. Remote LOWER lumbar-sacral fusion. IMPRESSION: 1. Acute displaced fracture of the RIGHT subcapital femoral neck. 2. No acute pelvic fractures. 3. Colonic diverticulosis. 4. Hysterectomy. 5. 5.2 x 3.4 x 4.8 centimeters fluid attenuation structure in the LEFT LOWER QUADRANT, possibly representing a seroma or lymphocele. Less likely the findings could be related to  ovarian mass. Recommend further evaluation with elective CT of the abdomen and pelvis with intravenous contrast. 6. 1.5 centimeter low-attenuation mass or lymph node in the LEFT external iliac chain. 7. Remote LOWER lumbar-sacral fusion. Electronically Signed   By: Nolon Nations M.D.    On: 02/25/2020 11:16   DG Chest Portable 1 View  Result Date: 02/25/2020 CLINICAL DATA:  Preop for hip fracture post fall. EXAM: PORTABLE CHEST 1 VIEW COMPARISON:  02/11/2017 FINDINGS: Left-sided pacemaker unchanged. Lungs are adequately inflated and otherwise clear. Remainder the exam is unchanged. IMPRESSION: No active disease. Electronically Signed   By: Marin Olp M.D.   On: 02/25/2020 10:21   DG PAIN CLINIC C-ARM 1-60 MIN NO REPORT  Result Date: 02/07/2020 Fluoro was used, but no Radiologist interpretation will be provided. Please refer to "NOTES" tab for provider progress note.  DG Hip Unilat W or Wo Pelvis 2-3 Views Right  Result Date: 02/25/2020 CLINICAL DATA:  Preop right hip fracture post fall. EXAM: DG HIP (WITH OR WITHOUT PELVIS) 2-3V RIGHT COMPARISON:  CT 02/11/2017 FINDINGS: Exam demonstrates a moderately displaced subcapital fracture of the right femoral neck with superior displacement of the distal fragment. Mild degenerative changes of the hips, sacroiliac joints and spine. Fusion hardware intact over the lumbosacral spine. Mild irregularity over the right superior pubic ramus and right side of the symphysis likely due to old injury although acute fracture is possible. Remainder the exam is unchanged. IMPRESSION: 1. Moderately displaced subcapital fracture of the right femoral neck. 2. Mild irregularity of the right superior pubic ramus and right side of the symphysis likely due to old injury and much less likely acute fracture. 3. Mild degenerative changes of the hips, sacroiliac joints and spine. Fusion hardware over the lumbosacral spine intact. Electronically Signed   By: Marin Olp M.D.   On: 02/25/2020 10:20    EKG: Normal sinus rhythm nonspecific ST-T wave changes  ASSESSMENT AND PLAN:  Preop hip surgery Status post fall fractured hip History of congestive heart failure History of DVT Reported coronary artery disease Idiopathic cardiac arrest with V. tach ,AICD in  place GERD Anxiety Seizure disorder Chronic lower back pain . Plan Patient appears to be an acceptable risk mild to moderate for hip procedure Continue to hold Xarelto for the procedure No direct therapy necessary for AICD No changes necessary to help modify her risk Continue blood pressure control Correct electrolytes especially hypokalemia and hypomagnesemia Inhalers for COPD and asthma as necessary Echocardiogram will be helpful for assessment of left ventricular function Continue metoprolol pre and postop  Signed: Yolonda Kida MD 02/26/2020, 3:07 PM

## 2020-02-26 NOTE — Anesthesia Preprocedure Evaluation (Addendum)
Anesthesia Evaluation  Patient identified by MRN, date of birth, ID band Patient awake    Reviewed: Allergy & Precautions, NPO status , Patient's Chart, lab work & pertinent test results  Airway Mallampati: III      Comment: Prior grade I view with Mac 3 in 2020. 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  TTE 2018:  - Left ventricle: The cavity size was normal. Systolic function was  vigorous. The estimated ejection fraction was 70%. Wall motion  was normal; there were no regional wall motion abnormalities.  Doppler parameters are consistent with abnormal left ventricular  relaxation (grade 1 diastolic dysfunction).  - Aortic valve: Valve area (Vmax): 2.48 cm^2.  - Mitral valve: There was mild regurgitation.  - Right atrium: The atrium was mildly dilated.  - Tricuspid valve: There was moderate regurgitation.  - Pulmonary arteries: PA peak pressure: 38 mm Hg (S).   Impressions:   - No cardiac source of emboli was indentified. The right  ventricular systolic pressure was increased consistent with mild  pulmonary hypertension.   Cath 2016 with non obstructive CAD   Neuro/Psych  Headaches, Seizures -,  PSYCHIATRIC DISORDERS Anxiety Depression Residual right sided weakness TIA Neuromuscular disease CVA, Residual Symptoms    GI/Hepatic Neg liver ROS, hiatal hernia, GERD  ,  Endo/Other  negative endocrine ROS  Renal/GU negative Renal ROS  negative genitourinary   Musculoskeletal  (+) Arthritis , Rheumatoid disorders,    Abdominal   Peds negative pediatric ROS (+)  Hematology negative hematology ROS (+)   Anesthesia Other Findings Past Medical History: No date: Allergy No date: Anxiety 05/06/2016: Asthma No date: Coronary artery disease No date: DDD  (degenerative disc disease), cervical No date: Depression No date: Diverticulosis     Comment:  seen on CT 2015 No date: DVT (deep venous thrombosis) (HCC) No date: GERD (gastroesophageal reflux disease) No date: Heart failure ([place]) No date: High cholesterol No date: Hyperlipidemia No date: Hypertension 02/2015: 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 fib, EF nl, non-obs CAD; [employer] ICD No date: Migraine No date: Obesity (BMI 30-39.9) No date: Seizures ([facility]CC) No date: Ventricular tachycardia ([place])  Reproductive/Obstetrics                            Anesthesia Physical Anesthesia Plan  ASA: IV  Anesthesia Plan: General   Post-op Pain Management:    Induction: Intravenous  PONV Risk Score and Plan:   Airway Management Planned: Oral ETT  Additional Equipment:   Intra-op Plan:   Post-operative Plan: Extubation in OR  Informed Consent: I have reviewed the patients History and Physical, chart, labs and discussed the procedure including the risks, benefits and alternatives for the proposed anesthesia with the patient or authorized representative who has indicated his/her understanding and acceptance.     Dental advisory given  Plan Discussed with: CRNA and Surgeon  Anesthesia Plan Comments:         Anesthesia Quick Evaluation                                  Anesthesia Evaluation  Patient identified by MRN, date of birth, ID band Patient awake  Reviewed: Allergy & Precautions, NPO status , Patient's Chart, lab work & pertinent test results  Airway Mallampati: I  TM Distance: >3 FB Neck ROM: Full    Dental  (+) Teeth Intact, Implants,    Pulmonary shortness of breath and with exertion,    Pulmonary exam normal        Cardiovascular Exercise Tolerance: Poor hypertension, Pt. 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   Neuro/Psych Seizures -, Well Controlled,  Anxiety Depression Two seizures since her asystolic arrest in 8/25. None since--on topiramate.    GI/Hepatic GERD  Poorly Controlled and Medicated,Reflux and dysphagia.   Endo/Other    Renal/GU      Musculoskeletal  (+) Arthritis , Osteoarthritis,    Abdominal (+) + obese,  Abdomen: soft.    Peds  Hematology   Anesthesia Other Findings   Reproductive/Obstetrics                             Anesthesia Physical Anesthesia Plan  ASA: IV  Anesthesia Plan: General   Post-op Pain Management:    Induction: Intravenous  Airway Management Planned: Nasal Cannula  Additional Equipment:   Intra-op Plan:   Post-operative Plan:   Informed Consent: I have reviewed the patients History and Physical, chart, labs and discussed the procedure including the risks, benefits and alternatives for the proposed anesthesia with the patient or authorized representative who has indicated his/her understanding and acceptance.   Dental advisory given  Plan Discussed with: CRNA  Anesthesia Plan Comments:         Anesthesia Quick Evaluation

## 2020-02-26 NOTE — Progress Notes (Signed)
PROGRESS NOTE   Claire Mcdonald  QPR:[id number]    DOB: [birth year]    DOA: 02/25/2020  PCP: [facility]   I have briefly reviewed patients previous medical records in [facility].  Chief Complaint  Patient presents with  . 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 tach, chronic CHF, DVT on Xarelto, nonobstructive CAD, diverticulosis, pancreatitis and obesity sustained a mechanical fall on day of admission, was unable to get up and presented to ED with right hip pain.  Admitted for right femoral neck fracture, plan for surgical fixation by orthopedics on 7/5, cardiology consulted on 7/4 for preop clearance.   Assessment & Plan:  Principal Problem:   Fracture of femoral neck, right, closed ([place]) Active Problems:   Essential hypertension   Hyperlipidemia   Deep vein thrombosis (DVT) (HCC)   CAD (coronary artery disease)   Anxiety   Asthma   Gastroesophageal reflux disease without esophagitis   Hypokalemia   Seizure ([place])   Fall   History of CVA (cerebrovascular accident)   Abdominal mass   Right closed femoral neck fracture: Sustained status post mechanical fall at home.  Neurovascular bundle intact.  Orthopedic consultation and follow-up appreciated.  Plan for surgical fixation on 7/5.  Patient had been off of Xarelto for 4 days PTA for some form of outpatient planned injection.  Currently on IV heparin drip to be held 8 hours prior to surgery.  Given extensive cardiac history, requested preop cardiac clearance.   S/p mechanical fall: At baseline ambulates with the help of a walker or a cane.  Denies history of frequent falls.  CT head, neck and L-spine without  acute findings.  Therapies evaluation post surgery.  Will likely need SNF.  Acute blood loss anemia: Hemoglobin dropped from 14.5 on admission to 11.3 today, likely related to the hip fracture.  No overt bleeding noted.  Follow CBC closely including this evening and daily.  Essential hypertension: Reasonably controlled.  Continue metoprolol 25 mg twice daily.  Hyperlipidemia: Continue atorvastatin.  Lower extremity DVT: Reports during CVA a couple of years ago.  Has been on Xarelto since.  Reportedly has been holding it for 4 days PTA for an outpatient injection procedure.  Currently on IV heparin drip.  Nonobstructive CAD, history of idiopathic cardiac arrest, V. tach, s/p ICD, chronic diastolic CHF: Reportedly follows with Dr. Sherre Lain, Cardiologist in [place].  Gives history of chronic DOE (unchanged) and what appears to be stable angina on exertion with infrequent use of sublingual NTG-last use was approximately a month ago.  Appears stable and CHF clinically compensated.  Given significant cardiac history, requested Cardiology preop clearance.  Continue statins and beta-blockers.  Anxiety and depression: Stable without suicidal or homicidal ideations.  Continue Zoloft.  Asthma: Stable without clinical bronchospasm.  GERD without esophagitis: Pepcid.  Hypokalemia: Replaced.  Seizure disorder: Seizure precautions.  Continue Keppra and Topamax.  History of CVA with residual left-sided weakness  Incidental LLQ abnormality on CT abdomen: As noted below.  Close outpatient follow-up with PCP regarding further evaluation and management.  "5.2 x 3.4 x 4.8 centimeters fluid attenuation structure in the LEFT LOWER QUADRANT, possibly representing a seroma or lymphocele. Less likely the findings could be related to  ovarian mass. Recommend further evaluation with elective CT of the abdomen and pelvis with intravenous contrast. 6. 1.5 centimeter low-attenuation mass or lymph node in the  LEFT external iliac chain."  Body mass index is 33.11 kg/m./Obesity   DVT prophylaxis: IV heparin drip Code Status: Full Family Communication: None at bedside Disposition:  Status is: Inpatient  Remains inpatient appropriate because:Inpatient level of care appropriate due to severity of illness   Dispo: The patient is from: Home              Anticipated d/c is to: SNF              Anticipated d/c date is: > 3 days              Patient currently is not medically stable to d/c.        Consultants:   Orthopedics Cardiology  Procedures:   None  Antimicrobials:   None   Subjective:  History outlined as above.  Moved into her new home approximately 2 weeks ago.  On day of admission while ambulating with the help of a walker, gradually leaned over and fell.  Denies LOC, head injury, chest pain, dyspnea, dizziness or lightheadedness preceding the fall.  Was unable to get up.  Her boyfriend arrived approximately 30 minutes later and called 911.  Currently with moderate right hip pain.  Currently without chest pain, dyspnea.  States that she had not taken Xarelto for 4 days PTA although medication reconciliation by pharmacy indicates last dose 7/2  Objective:   Vitals:   02/25/20 1225 02/25/20 1600 02/25/20 1713 02/26/20 0730  BP: (!) 113/57 126/75 (!) 156/85 112/71  Pulse: 62 74 81 63  Resp: 11 11 20 16   Temp:   98.3 F (36.8 C) 97.6 F (36.4 C)  TempSrc:   Oral   SpO2: 97% 100% 95% 99%  Weight:      Height:        General exam: Pleasant young female, moderately built and obese lying comfortably propped up in bed without distress. Respiratory system: Clear to auscultation. Respiratory effort normal. Cardiovascular system: S1 & S2 heard, RRR. No JVD, murmurs, rubs, gallops or clicks. No pedal edema.  Was not on telemetry, ordered telemetry. Gastrointestinal system: Abdomen is nondistended, soft and nontender. No organomegaly or masses felt. Normal bowel sounds  heard. Central nervous system: Alert and oriented. No focal neurological deficits. Extremities: Symmetric 5 x 5 power.  Right lower extremity movements restricted secondary to pain. Skin: No rashes, lesions or ulcers Psychiatry: Judgement and insight appear normal. Mood & affect appropriate.     Data Reviewed:   I have personally reviewed following labs and imaging studies   CBC: Recent Labs  Lab 02/25/20 1034 02/26/20 0407  WBC 8.2 5.6  NEUTROABS 6.3  --   HGB 14.5 11.3*  HCT 44.6 35.8*  MCV 90.3 92.3  PLT 344 956    Basic Metabolic Panel: Recent Labs  Lab 02/25/20 1034 02/26/20 0407  NA 142 142  K 2.9* 4.3  CL 105 108  CO2 25 30  GLUCOSE 96 114*  BUN 17 15  CREATININE 0.79 0.83  CALCIUM 8.9 8.4*    Liver Function Tests: Recent Labs  Lab 02/25/20 1034  AST 28  ALT 29  ALKPHOS 125  BILITOT 0.8  PROT 7.6  ALBUMIN 3.9    CBG: No results for input(s): GLUCAP in the last 168 hours.  Microbiology Studies:   Recent Results (from the past 240 hour(s))  SARS Coronavirus 2 by RT PCR (hospital order, performed in [facility] hospital lab) Nasopharyngeal Nasopharyngeal Swab     Status: None   Collection Time: 02/25/20 12:49 PM   Specimen: Nasopharyngeal Swab  Result Value Ref Range Status   SARS Coronavirus 2 NEGATIVE NEGATIVE Final    Comment: (NOTE) SARS-CoV-2 target nucleic acids are NOT DETECTED.  The SARS-CoV-2 RNA is generally detectable in upper and lower respiratory specimens during the acute phase of infection. The lowest concentration of SARS-CoV-2 viral copies this assay can detect is 250 copies / mL. A negative result does not preclude SARS-CoV-2 infection and should not be used as the sole basis for treatment or other patient management decisions.  A negative result may occur with improper specimen collection / handling, submission of specimen other than nasopharyngeal swab, presence of viral mutation(s) within the areas targeted by this  assay, and inadequate number of viral copies (<250 copies / mL). A negative result must be combined with clinical observations, patient history, and epidemiological information.  Fact Sheet for Patients:   StrictlyIdeas.no  Fact Sheet for Healthcare Providers: BankingDealers.co.za  This test is not yet approved or  cleared by the [place] FDA and has been authorized for detection and/or diagnosis of SARS-CoV-2 by FDA under an Emergency Use Authorization (EUA).  This EUA will remain in effect (meaning this test can be used) for the duration of the COVID-19 declaration under Section 564(b)(1) of the Act, 21 U.S.C. section 360bbb-3(b)(1), unless the authorization is terminated or revoked sooner.  Performed at [facility], [address] [place], [place] [address]      Radiology Studies:  CT Head Wo Contrast  Result Date: 02/25/2020 CLINICAL DATA:  Status post fall with neck pain EXAM: CT HEAD WITHOUT CONTRAST CT CERVICAL SPINE WITHOUT CONTRAST TECHNIQUE: Multidetector CT imaging of the head and cervical spine was performed following the standard protocol without intravenous contrast. Multiplanar CT image reconstructions of the cervical spine were also generated. COMPARISON:  Cervical spine CT October 27, 2012 FINDINGS: CT HEAD FINDINGS Brain: No evidence of acute infarction, hemorrhage, hydrocephalus, extra-axial collection or mass lesion/mass effect. There is chronic diffuse atrophy. Vascular: No hyperdense vessel is noted. Skull: Normal. Negative for fracture or focal lesion. Sinuses/Orbits: No acute finding. Other: None. CT CERVICAL SPINE FINDINGS Alignment: There is straightening of cervical spine probably due to muscle spasm or positioning. Skull base and vertebrae: No acute fracture. No primary bone lesion or focal pathologic process. Soft tissues and spinal canal: No prevertebral fluid or swelling. No visible canal hematoma.  Disc levels: Minimal decreased intervertebral spaces are identified in the mid to lower cervical spine. No significant osteophytosis are noted. Upper chest: Negative. Other: None. IMPRESSION: 1. No focal acute intracranial abnormality identified. 2. No acute fracture or dislocation of cervical spine. 3. Straightening of cervical spine probably due to muscle spasm or positioning. Electronically Signed   By: Abelardo Diesel M.D.   On: 02/25/2020 11:08   CT Cervical Spine Wo Contrast  Result Date: 02/25/2020 CLINICAL DATA:  Status post fall with neck pain EXAM: CT HEAD WITHOUT CONTRAST CT CERVICAL SPINE WITHOUT CONTRAST TECHNIQUE: Multidetector CT imaging of the head and cervical spine was performed following the standard protocol without intravenous contrast. Multiplanar CT image reconstructions of the cervical spine were also generated. COMPARISON:  Cervical spine CT October 27, 2012 FINDINGS: CT HEAD FINDINGS Brain: No evidence of acute infarction, hemorrhage, hydrocephalus, extra-axial collection or mass lesion/mass effect. There is chronic diffuse atrophy. Vascular: No hyperdense  vessel is noted. Skull: Normal. Negative for fracture or focal lesion. Sinuses/Orbits: No acute finding. Other: None. CT CERVICAL SPINE FINDINGS Alignment: There is straightening of cervical spine probably due to muscle spasm or positioning. Skull base and vertebrae: No acute fracture. No primary bone lesion or focal pathologic process. Soft tissues and spinal canal: No prevertebral fluid or swelling. No visible canal hematoma. Disc levels: Minimal decreased intervertebral spaces are identified in the mid to lower cervical spine. No significant osteophytosis are noted. Upper chest: Negative. Other: None. IMPRESSION: 1. No focal acute intracranial abnormality identified. 2. No acute fracture or dislocation of cervical spine. 3. Straightening of cervical spine probably due to muscle spasm or positioning. Electronically Signed   By: Abelardo Diesel M.D.   On: 02/25/2020 11:08   CT Lumbar Spine Wo Contrast  Result Date: 02/25/2020 CLINICAL DATA:  54 year old female status post fall this morning. Pain. EXAM: CT LUMBAR SPINE WITHOUT CONTRAST TECHNIQUE: Multidetector CT imaging of the lumbar spine was performed without intravenous contrast administration. Multiplanar CT image reconstructions were also generated. COMPARISON:  CT Pelvis today reported separately. CT lumbar myelogram 06/16/2019. FINDINGS: Segmentation: Normal, the same numbering system used on the prior CT myelogram. Alignment: Stable lumbar lordosis. Stable to mildly reduced chronic anterolisthesis of L5 on S1 following surgery since October. Vertebrae: Postoperative details are below. Stable lumbar vertebral height and alignment. Visible lower thoracic levels appear intact. No acute osseous abnormality identified. Visible sacrum and SI joints appear intact. Paraspinal and other soft tissues: Negative noncontrast abdominal viscera. Diverticulosis in the visible distal colon. Pelvic viscera reported separately today. Mild postoperative changes to the lower lumbar paraspinal soft tissues with no adverse features. Disc levels: T11-T12: Mild vacuum disc and endplate spurring but no evidence of stenosis. T12-L1:  Negative. L1-L2:  Subtle retrolisthesis and disc bulge.  No stenosis. L2-L3:  Negative. L3-L4: Increased epidural lipomatosis since October which partially effaces the thecal sac on series 9, image 73. Mild facet hypertrophy appears stable. Otherwise negative. L4-L5: Interval posterior and interbody fusion hardware placement. Evidence of developing interbody arthrodesis. Chronic posterior element hypertrophy, no definite posterior element arthrodesis at this time. No evidence of hardware loosening. Residual endplate spurring. No spinal stenosis suspected. However, there appears to be a small partially calcified disc fragment or less likely endplate fragment in the left L4 neural foramen  best seen on series 7, image 48. This is not well correlated on axial images. L5-S1: Interval posterior and anterior fusion hardware placement with stable to regressed chronic spondylolisthesis at this level. Evidence of developing interbody arthrodesis. No hardware loosening. Chronic posterior element hypertrophy, no definite posterior element arthrodesis at this time. Bilateral L5 foraminal patency appears improved, with no spinal stenosis suspected. IMPRESSION: 1. No acute traumatic injury identified. 2. Posterior and interbody fusion since October at L4-L5 and L5-S1 with developing interbody arthrodesis suspected at both levels. No hardware loosening, and no spinal stenosis. However, possible disc or disc osteophyte fragment in the left L4 neural foramen which is new from October. Query left L4 radiculitis. 3. Increased epidural lipomatosis at L3-L4 with new partial effacement the thecal sac. But no other adjacent segment disease. 4. CT Pelvis today reported separately. Electronically Signed   By: Genevie Ann M.D.   On: 02/25/2020 11:12   CT PELVIS WO CONTRAST  Result Date: 02/25/2020 CLINICAL DATA:  Pelvic fracture. EXAM: CT PELVIS WITHOUT CONTRAST TECHNIQUE: Multidetector CT imaging of the pelvis was performed following the standard protocol without intravenous contrast. COMPARISON:  02/19/2017 FINDINGS: Urinary Tract:  The bladder and visualized portion of the urethra are normal. Distal ureters are unremarkable. Bowel: There is significant diverticular disease of the descending and sigmoid colon. No associated inflammatory changes to suggest acute diverticulitis. Postoperative changes in the region of the cecum. The appendix is not seen. Regional small bowel loops are unremarkable. Vascular/Lymphatic: Normal noncontrast appearance of the LOWER aorta and other vascular structures. Reproductive: Hysterectomy. Within the LEFT LOWER QUADRANT there is BB a fluid attenuation structure measuring 5.2 x 3.4 x 4.8  centimeters. Just inferior to this, there is a low-attenuation 1.5 centimeter mass or lymph node in the LEFT external iliac chain. Postoperative changes are identified in the Other: No ascites.  Anterior abdominal wall is unremarkable. Musculoskeletal: There is an acute displaced fracture of the RIGHT subcapital femoral neck, associated with foreshortening and overriding of fracture fragments. The femoral head is located in the acetabulum. No acute pelvic fractures. Mild irregularity identified in the RIGHT superior pubic ramus appears chronic. There is mild degenerative change in the SI joints. Remote LOWER lumbar-sacral fusion. IMPRESSION: 1. Acute displaced fracture of the RIGHT subcapital femoral neck. 2. No acute pelvic fractures. 3. Colonic diverticulosis. 4. Hysterectomy. 5. 5.2 x 3.4 x 4.8 centimeters fluid attenuation structure in the LEFT LOWER QUADRANT, possibly representing a seroma or lymphocele. Less likely the findings could be related to ovarian mass. Recommend further evaluation with elective CT of the abdomen and pelvis with intravenous contrast. 6. 1.5 centimeter low-attenuation mass or lymph node in the LEFT external iliac chain. 7. Remote LOWER lumbar-sacral fusion. Electronically Signed   By: Nolon Nations M.D.   On: 02/25/2020 11:16   DG Chest Portable 1 View  Result Date: 02/25/2020 CLINICAL DATA:  Preop for hip fracture post fall. EXAM: PORTABLE CHEST 1 VIEW COMPARISON:  02/11/2017 FINDINGS: Left-sided pacemaker unchanged. Lungs are adequately inflated and otherwise clear. Remainder the exam is unchanged. IMPRESSION: No active disease. Electronically Signed   By: Marin Olp M.D.   On: 02/25/2020 10:21   DG Hip Unilat W or Wo Pelvis 2-3 Views Right  Result Date: 02/25/2020 CLINICAL DATA:  Preop right hip fracture post fall. EXAM: DG HIP (WITH OR WITHOUT PELVIS) 2-3V RIGHT COMPARISON:  CT 02/11/2017 FINDINGS: Exam demonstrates a moderately displaced subcapital fracture of the right  femoral neck with superior displacement of the distal fragment. Mild degenerative changes of the hips, sacroiliac joints and spine. Fusion hardware intact over the lumbosacral spine. Mild irregularity over the right superior pubic ramus and right side of the symphysis likely due to old injury although acute fracture is possible. Remainder the exam is unchanged. IMPRESSION: 1. Moderately displaced subcapital fracture of the right femoral neck. 2. Mild irregularity of the right superior pubic ramus and right side of the symphysis likely due to old injury and much less likely acute fracture. 3. Mild degenerative changes of the hips, sacroiliac joints and spine. Fusion hardware over the lumbosacral spine intact. Electronically Signed   By: Marin Olp M.D.   On: 02/25/2020 10:20     Scheduled Meds:   . atorvastatin  40 mg Oral q1800  . calcium-vitamin D  2 tablet Oral Q breakfast  . cholecalciferol  5,000 Units Oral Daily  . famotidine  20 mg Oral BID  . fluticasone  2 puff Inhalation BID  . furosemide  20 mg Oral BID  . gabapentin  300 mg Oral TID  . levETIRAcetam  500 mg Oral BID  . loratadine  10 mg Oral Daily  . magnesium oxide  400 mg Oral Daily  . metoprolol tartrate  25 mg Oral BID  . sertraline  100 mg Oral Daily  . sodium chloride flush  10-40 mL Intracatheter Q12H  . topiramate  100 mg Oral BID  . [START ON 02/27/2020] tranexamic acid (CYKLOKAPRON) topical - INTRAOP  2,000 mg Topical 30 min Pre-Op    Continuous Infusions:   . [START ON 02/27/2020]  ceFAZolin (ANCEF) IV    . heparin 800 Units/hr (02/26/20 0707)     LOS: 1 day     Vernell Leep, MD, [place], [facility]. [facility]    To contact the attending provider between 7A-7P or the covering provider during after hours 7P-7A, please log into the web site [email or web address] and access using universal [place] password for that web site. If you do not have the password, please call the hospital operator.  02/26/2020, 11:48 AM

## 2020-02-26 NOTE — Plan of Care (Signed)
  Problem: Education: Goal: Knowledge of General Education information will improve Description: Including pain rating scale, medication(s)/side effects and non-pharmacologic comfort measures Outcome: Progressing   Problem: Pain Managment: Goal: General experience of comfort will improve Outcome: Progressing   

## 2020-02-27 ENCOUNTER — Inpatient Hospital Stay: Payer: Medicaid Other

## 2020-02-27 ENCOUNTER — Encounter
Admission: EM | Disposition: A | Discharge status: Home / Self Care | Payer: Self-pay | Source: Home / Self Care | Attending: Internal Medicine

## 2020-02-27 ENCOUNTER — Inpatient Hospital Stay: Payer: Medicaid Other | Admitting: Anesthesiology

## 2020-02-27 ENCOUNTER — Encounter: Payer: Self-pay | Admitting: Internal Medicine

## 2020-02-27 HISTORY — PX: HIP ARTHROPLASTY: SHX981

## 2020-02-27 LAB — BASIC METABOLIC PANEL
Anion gap: 12 (ref 5–15)
BUN: 17 mg/dL (ref 6–20)
CO2: 21 mmol/L — ABNORMAL LOW (ref 22–32)
Calcium: 8.4 mg/dL — ABNORMAL LOW (ref 8.9–10.3)
Chloride: 107 mmol/L (ref 98–111)
Creatinine, Ser: 0.85 mg/dL (ref 0.44–1.00)
GFR calc Af Amer: >60 mL/min (ref >60)
GFR calc non Af Amer: >60 mL/min (ref >60)
Glucose, Bld: 165 mg/dL — ABNORMAL HIGH (ref 70–99)
Potassium: 4.4 mmol/L (ref 3.5–5.1)
Sodium: 140 mmol/L (ref 135–145)

## 2020-02-27 LAB — CBC
HCT: 34.3 % — ABNORMAL LOW (ref 36.0–46.0)
Hemoglobin: 11.9 g/dL — ABNORMAL LOW (ref 12.0–15.0)
MCH: 29.8 pg (ref 26.0–34.0)
MCHC: 34.7 g/dL (ref 30.0–36.0)
MCV: 86 fL (ref 80.0–100.0)
Platelets: 281 10*3/uL (ref 150–400)
RBC: 3.99 MIL/uL (ref 3.87–5.11)
RDW: 14.2 % (ref 11.5–15.5)
WBC: 9.6 10*3/uL (ref 4.0–10.5)
nRBC: 0 % (ref 0.0–0.2)

## 2020-02-27 LAB — MAGNESIUM: Magnesium: 2.3 mg/dL (ref 1.7–2.4)

## 2020-02-27 SURGERY — HEMIARTHROPLASTY, HIP, DIRECT ANTERIOR APPROACH, FOR FRACTURE
Anesthesia: General | Site: Hip | Laterality: Right

## 2020-02-27 MED ORDER — LIDOCAINE HCL (CARDIAC) PF 100 MG/5ML IV SOSY
PREFILLED_SYRINGE | INTRAVENOUS | Status: DC | PRN
  Administered 2020-02-27: 60 mg via INTRAVENOUS

## 2020-02-27 MED ORDER — DOCUSATE SODIUM 100 MG PO CAPS
100.0000 mg | ORAL_CAPSULE | Freq: Two times a day (BID) | ORAL | Status: DC
  Administered 2020-02-27 – 2020-02-29 (×4): 100 mg via ORAL
  Filled 2020-02-27 (×6): qty 1

## 2020-02-27 MED ORDER — MAGNESIUM CITRATE PO SOLN
1.0000 | Freq: Once | ORAL | Status: DC | PRN
  Filled 2020-02-27: qty 296

## 2020-02-27 MED ORDER — METOCLOPRAMIDE HCL 5 MG/ML IJ SOLN: 5.0000 mg | Freq: Three times a day (TID) | INTRAMUSCULAR | Status: DC | PRN

## 2020-02-27 MED ORDER — MENTHOL 3 MG MT LOZG
1.0000 | LOZENGE | OROMUCOSAL | Status: DC | PRN
  Filled 2020-02-27: qty 9

## 2020-02-27 MED ORDER — PROPOFOL 10 MG/ML IV BOLUS
INTRAVENOUS | Status: DC | PRN
  Administered 2020-02-27: 100 mg via INTRAVENOUS

## 2020-02-27 MED ORDER — ADULT MULTIVITAMIN W/MINERALS CH
1.0000 | ORAL_TABLET | Freq: Every day | ORAL | Status: DC
  Administered 2020-02-28 – 2020-03-01 (×3): 1 via ORAL
  Filled 2020-02-27 (×4): qty 1

## 2020-02-27 MED ORDER — EPHEDRINE 5 MG/ML INJ
INTRAVENOUS | Status: AC
  Filled 2020-02-27: qty 10

## 2020-02-27 MED ORDER — SUGAMMADEX SODIUM 200 MG/2ML IV SOLN
INTRAVENOUS | Status: DC | PRN
  Administered 2020-02-27: 200 mg via INTRAVENOUS

## 2020-02-27 MED ORDER — BISACODYL 10 MG RE SUPP
10.0000 mg | Freq: Every day | RECTAL | Status: DC | PRN
  Filled 2020-02-27: qty 1

## 2020-02-27 MED ORDER — MIDAZOLAM HCL 2 MG/2ML IJ SOLN
INTRAMUSCULAR | Status: DC | PRN
  Administered 2020-02-27: 1 mg via INTRAVENOUS

## 2020-02-27 MED ORDER — ACETAMINOPHEN 10 MG/ML IV SOLN
INTRAVENOUS | Status: AC
  Filled 2020-02-27: qty 100

## 2020-02-27 MED ORDER — SUCCINYLCHOLINE CHLORIDE 20 MG/ML IJ SOLN
INTRAMUSCULAR | Status: DC | PRN
  Administered 2020-02-27: 100 mg via INTRAVENOUS

## 2020-02-27 MED ORDER — FENTANYL CITRATE (PF) 100 MCG/2ML IJ SOLN
INTRAMUSCULAR | Status: DC | PRN
  Administered 2020-02-27 (×3): 25 ug via INTRAVENOUS

## 2020-02-27 MED ORDER — RIVAROXABAN 20 MG PO TABS
20.0000 mg | ORAL_TABLET | Freq: Every day | ORAL | Status: DC
  Administered 2020-02-28 – 2020-02-29 (×2): 20 mg via ORAL
  Filled 2020-02-27 (×3): qty 1

## 2020-02-27 MED ORDER — LACTATED RINGERS IV SOLN: INTRAVENOUS | Status: DC

## 2020-02-27 MED ORDER — ALUM & MAG HYDROXIDE-SIMETH 200-200-20 MG/5ML PO SUSP: 30.0000 mL | ORAL | Status: DC | PRN

## 2020-02-27 MED ORDER — PHENYLEPHRINE HCL (PRESSORS) 10 MG/ML IV SOLN
INTRAVENOUS | Status: AC
  Filled 2020-02-27: qty 1

## 2020-02-27 MED ORDER — TRANEXAMIC ACID 1000 MG/10ML IV SOLN
INTRAVENOUS | Status: DC | PRN
  Administered 2020-02-27: 2000 mg via TOPICAL

## 2020-02-27 MED ORDER — ACETAMINOPHEN 10 MG/ML IV SOLN
INTRAVENOUS | Status: DC | PRN
  Administered 2020-02-27: 1000 mg via INTRAVENOUS

## 2020-02-27 MED ORDER — CLINDAMYCIN PHOSPHATE 600 MG/50ML IV SOLN
600.0000 mg | Freq: Four times a day (QID) | INTRAVENOUS | Status: AC
  Administered 2020-02-27 (×2): 600 mg via INTRAVENOUS
  Filled 2020-02-27 (×2): qty 50

## 2020-02-27 MED ORDER — METOCLOPRAMIDE HCL 10 MG PO TABS: 5.0000 mg | ORAL_TABLET | Freq: Three times a day (TID) | ORAL | Status: DC | PRN

## 2020-02-27 MED ORDER — SODIUM CHLORIDE 0.9 % IV SOLN
INTRAVENOUS | Status: DC | PRN
  Administered 2020-02-27: 50 ug/min via INTRAVENOUS

## 2020-02-27 MED ORDER — DEXAMETHASONE SODIUM PHOSPHATE 10 MG/ML IJ SOLN
INTRAMUSCULAR | Status: DC | PRN
  Administered 2020-02-27: 10 mg via INTRAVENOUS

## 2020-02-27 MED ORDER — SODIUM CHLORIDE 0.9 % IR SOLN: Status: DC | PRN

## 2020-02-27 MED ORDER — FENTANYL CITRATE (PF) 100 MCG/2ML IJ SOLN
INTRAMUSCULAR | Status: AC
  Filled 2020-02-27: qty 2

## 2020-02-27 MED ORDER — BUPIVACAINE-EPINEPHRINE (PF) 0.25% -1:200000 IJ SOLN
INTRAMUSCULAR | Status: DC | PRN
  Administered 2020-02-27: 30 mL via PERINEURAL

## 2020-02-27 MED ORDER — SODIUM CHLORIDE 0.9 % IV SOLN: INTRAVENOUS | Status: DC | PRN

## 2020-02-27 MED ORDER — PHENOL 1.4 % MT LIQD
1.0000 | OROMUCOSAL | Status: DC | PRN
  Filled 2020-02-27: qty 177

## 2020-02-27 MED ORDER — MAGNESIUM HYDROXIDE 400 MG/5ML PO SUSP: 30.0000 mL | Freq: Every day | ORAL | Status: DC | PRN

## 2020-02-27 MED ORDER — FENTANYL CITRATE (PF) 100 MCG/2ML IJ SOLN: 25.0000 ug | INTRAMUSCULAR | Status: DC | PRN

## 2020-02-27 MED ORDER — EPHEDRINE SULFATE 50 MG/ML IJ SOLN
INTRAMUSCULAR | Status: DC | PRN
  Administered 2020-02-27: 10 mg via INTRAVENOUS
  Administered 2020-02-27: 5 mg via INTRAVENOUS

## 2020-02-27 MED ORDER — PHENYLEPHRINE HCL (PRESSORS) 10 MG/ML IV SOLN
INTRAVENOUS | Status: DC | PRN
  Administered 2020-02-27 (×2): 100 ug via INTRAVENOUS
  Administered 2020-02-27 (×2): 200 ug via INTRAVENOUS

## 2020-02-27 MED ORDER — ENSURE ENLIVE PO LIQD
237.0000 mL | Freq: Two times a day (BID) | ORAL | Status: DC
  Administered 2020-02-28 – 2020-03-01 (×4): 237 mL via ORAL

## 2020-02-27 MED ORDER — DEXAMETHASONE SODIUM PHOSPHATE 10 MG/ML IJ SOLN
INTRAMUSCULAR | Status: AC
  Filled 2020-02-27: qty 1

## 2020-02-27 MED ORDER — PROPOFOL 500 MG/50ML IV EMUL
INTRAVENOUS | Status: AC
  Filled 2020-02-27: qty 50

## 2020-02-27 MED ORDER — SODIUM CHLORIDE FLUSH 0.9 % IV SOLN
INTRAVENOUS | Status: AC
  Filled 2020-02-27: qty 10

## 2020-02-27 MED ORDER — MIDAZOLAM HCL 2 MG/2ML IJ SOLN
INTRAMUSCULAR | Status: AC
  Filled 2020-02-27: qty 2

## 2020-02-27 MED ORDER — ROCURONIUM BROMIDE 100 MG/10ML IV SOLN
INTRAVENOUS | Status: DC | PRN
  Administered 2020-02-27: 10 mg via INTRAVENOUS
  Administered 2020-02-27: 50 mg via INTRAVENOUS

## 2020-02-27 MED ORDER — LIDOCAINE HCL (PF) 2 % IJ SOLN
INTRAMUSCULAR | Status: AC
  Filled 2020-02-27: qty 5

## 2020-02-27 SURGICAL SUPPLY — 64 items
APL PRP STRL LF DISP 70% ISPRP (MISCELLANEOUS) ×1
BAG DECANTER FOR FLEXI CONT (MISCELLANEOUS) ×3 IMPLANT
BLADE SAGITTAL 25.0X1.19X90 (BLADE) ×1 IMPLANT
BLADE SAGITTAL 25.0X1.19X90MM (BLADE) ×1
BLADE SAW 1 (BLADE) ×3 IMPLANT
CANISTER SUCT 1200ML W/VALVE (MISCELLANEOUS) ×3 IMPLANT
CANISTER SUCT 3000ML PPV (MISCELLANEOUS) ×6 IMPLANT
CHLORAPREP W/TINT 26 (MISCELLANEOUS) ×3 IMPLANT
COVER WAND RF STERILE (DRAPES) ×3 IMPLANT
CRADLE LAMINECT ARM (MISCELLANEOUS) ×3 IMPLANT
DRAPE 3/4 80X56 (DRAPES) ×3 IMPLANT
DRAPE INCISE IOBAN 66X60 STRL (DRAPES) ×3 IMPLANT
DRAPE U-SHAPE 47X51 STRL (DRAPES) ×3 IMPLANT
DRSG AQUACEL AG ADV 3.5X10 (GAUZE/BANDAGES/DRESSINGS) ×3 IMPLANT
DRSG AQUACEL AG ADV 3.5X14 (GAUZE/BANDAGES/DRESSINGS) ×3 IMPLANT
ELECT BLADE 6.5 EXT (BLADE) ×3 IMPLANT
ELECT CAUTERY BLADE 6.4 (BLADE) IMPLANT
ELECT REM PT RETURN 9FT ADLT (ELECTROSURGICAL) ×3
ELECTRODE REM PT RTRN 9FT ADLT (ELECTROSURGICAL) ×1 IMPLANT
FEM HEAD COCR 28MM 3 (Orthopedic Implant) ×3 IMPLANT
FEMORAL HEAD COCR 28MM 3 (Orthopedic Implant) IMPLANT
GAUZE PACK 2X3YD (GAUZE/BANDAGES/DRESSINGS) ×3 IMPLANT
GAUZE XEROFORM 1X8 LF (GAUZE/BANDAGES/DRESSINGS) ×3 IMPLANT
GLOVE INDICATOR 8.0 STRL GRN (GLOVE) ×3 IMPLANT
GLOVE SURG ORTHO 8.0 STRL STRW (GLOVE) ×3 IMPLANT
GOWN STRL REUS W/ TWL LRG LVL3 (GOWN DISPOSABLE) ×1 IMPLANT
GOWN STRL REUS W/ TWL XL LVL3 (GOWN DISPOSABLE) ×1 IMPLANT
GOWN STRL REUS W/TWL LRG LVL3 (GOWN DISPOSABLE) ×3
GOWN STRL REUS W/TWL XL LVL3 (GOWN DISPOSABLE) ×3
HEMOVAC 400CC 10FR (MISCELLANEOUS) IMPLANT
HOLDER FOLEY CATH W/STRAP (MISCELLANEOUS) ×3 IMPLANT
HOOD PEEL AWAY FLYTE STAYCOOL (MISCELLANEOUS) ×11 IMPLANT
IV NS 100ML SINGLE PACK (IV SOLUTION) ×3 IMPLANT
KIT TURNOVER KIT A (KITS) ×3 IMPLANT
LINER BIPOLAR 28MM (Liner) ×2 IMPLANT
MAT ABSORB  FLUID 56X50 GRAY (MISCELLANEOUS) ×3
MAT ABSORB FLUID 56X50 GRAY (MISCELLANEOUS) ×1 IMPLANT
NDL SAFETY ECLIPSE 18X1.5 (NEEDLE) ×1 IMPLANT
NDL SPNL 20GX3.5 QUINCKE YW (NEEDLE) IMPLANT
NEEDLE HYPO 18GX1.5 SHARP (NEEDLE) ×3
NEEDLE HYPO 22GX1.5 SAFETY (NEEDLE) ×3 IMPLANT
NEEDLE SPNL 20GX3.5 QUINCKE YW (NEEDLE) ×3 IMPLANT
NS IRRIG 1000ML POUR BTL (IV SOLUTION) ×3 IMPLANT
PACK HIP PROSTHESIS (MISCELLANEOUS) ×3 IMPLANT
PILLOW ABDUCTION FOAM SM (MISCELLANEOUS) ×3 IMPLANT
PILLOW ABDUCTION MEDIUM (MISCELLANEOUS) ×3 IMPLANT
PRESSURIZER CEMENT PROX FEM SM (MISCELLANEOUS) ×3 IMPLANT
PRESSURIZER FEM CANAL M (MISCELLANEOUS) ×3 IMPLANT
PULSAVAC PLUS IRRIG FAN TIP (DISPOSABLE) ×3
SOL .9 NS 3000ML IRR  AL (IV SOLUTION) ×3
SOL .9 NS 3000ML IRR AL (IV SOLUTION) ×1
SOL .9 NS 3000ML IRR UROMATIC (IV SOLUTION) ×1 IMPLANT
STAPLER SKIN PROX 35W (STAPLE) ×3 IMPLANT
STEM STD COLLAR SZ1 POLARSTEM (Stem) ×2 IMPLANT
SUT DVC 2 QUILL PDO  T11 36X36 (SUTURE) ×3
SUT DVC 2 QUILL PDO T11 36X36 (SUTURE) ×1 IMPLANT
SUT VIC AB 0 CT1 18XCR BRD 8 (SUTURE) ×1 IMPLANT
SUT VIC AB 0 CT1 8-18 (SUTURE) ×3
SUT VIC AB 2-0 CT1 18 (SUTURE) ×3 IMPLANT
SYR 20ML LL LF (SYRINGE) ×3 IMPLANT
SYR 30ML LL (SYRINGE) ×3 IMPLANT
TIP BRUSH PULSAVAC PLUS 24.33 (MISCELLANEOUS) ×3 IMPLANT
TIP FAN IRRIG PULSAVAC PLUS (DISPOSABLE) ×1 IMPLANT
TOWER CARTRIDGE SMART MIX (DISPOSABLE) ×3 IMPLANT

## 2020-02-27 NOTE — Plan of Care (Signed)

## 2020-02-27 NOTE — Op Note (Signed)
02/27/2020  11:11 AM  PATIENT:  Claire Mcdonald   MRN: [id number]  PRE-OPERATIVE DIAGNOSIS:  Displaced Subcapital fracture right hip   POST-OPERATIVE DIAGNOSIS: Same  Procedure: Right Hip Anterior Hip Hemiarthroplasty   Surgeon: Elyn Aquas. Harlow Mares, MD   Assist: None  Anesthesia: Spinal   EBL: 200 mL   Specimens: None   Drains: None   Components used: A size 1 Polarstem [employer], a 43 mm bipolar head    Description of the procedure in detail: After informed consent was obtained and the appropriate extremity marked in the pre-operative holding area, the patient was taken to the operating room and placed in the supine position on the fracture table. All pressure points were well padded and bilateral lower extremities were place in traction spars. The hip was prepped and draped in standard sterile fashion. A spinal anesthetic had been delivered by the anesthesia team. The skin and subcutaneous tissues were injected with a mixture of Marcaine with epinephrine for post-operative pain. A longitudinal incision approximately 10 cm in length was carried out from the anterior superior iliac spine to the greater trochanter. The tensor fascia was divided and blunt dissection was taken down to the level of the joint capsule. The lateral circumflex vessels were cauterized. Deep retractors were placed and a portion of the anterior capsule was excised. Using fluoroscopy the neck cut was planned and carried out with a sagittal saw. The head was passed from the field with use of a corkscrew and hip skid. Deep retractors were placed along the acetabulum and bony and soft tissue debris was removed.   Attention was then turned to the proximal femur. The leg was placed in extension and external rotation. The canal was opened and sequentially broached to a size 1. The trial components were placed and the hip relocated. The components were found to be in good position using fluoroscopy. The hip was dislocated and  the trial components removed. The final components were impacted in to position and the hip relocated. The final components were again check with fluoroscopy and found to be in good position. Hemostasis was achieved with electrocautery. The deep capsule was injected with Marcaine and epinephrine. The wound was irrigated with bacitracin laced normal saline and the tensor fascia closed with #2 Quill suture. The subcutaneous tissues were closed with 2-0 vicryl and staples for the skin. A sterile dressing was applied and an abduction pillow. Patient tolerated the procedure well and there were no apparent complication. Patient was taken to the recovery room in good condition.    Elyn Aquas. Harlow Mares, MD  02/27/2020 11:11 AM

## 2020-02-27 NOTE — Progress Notes (Signed)
PT Cancellation Note  Patient Details Name: Claire Mcdonald MRN: [id number] DOB: [birth year]   Cancelled Treatment:    Reason Eval/Treat Not Completed: Fatigue/lethargy limiting ability to participate;Other (comment) Attempted to see pt x 3. Unable to complete eval at this time secondary to blood draws and lethargy. Will attempt eval tomorrow when pt appropriate.   [place] Haven 02/27/2020, 3:47 PM

## 2020-02-27 NOTE — Progress Notes (Addendum)
PROGRESS NOTE   Claire Mcdonald  PHX:[id number]    DOB: 04-11-1966    DOA: 02/25/2020  PCP: [facility] [place]   I have briefly reviewed patients previous medical records in [facility].  Chief Complaint  Patient presents with  . 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 tach, chronic CHF, DVT on Xarelto, nonobstructive CAD, diverticulosis, pancreatitis and obesity sustained a mechanical fall on day of admission, was unable to get up and presented to ED with right hip pain.  Admitted for right femoral neck fracture, underwent right hip anterior hip hemiarthroplasty on 7/5 after preop cardiac clearance.   Assessment & Plan:  Principal Problem:   Fracture of femoral neck, right, closed ([place]) Active Problems:   Essential hypertension   Hyperlipidemia   Deep vein thrombosis (DVT) (H[facility])   CAD (coronary artery disease)   Anxiety   Asthma   Gastroesophageal reflux disease without esophagitis   Hypokalemia   Seizure ([place])   Fall   History of CVA (cerebrovascular accident)   Abdominal mass   Right closed femoral neck fracture: Sustained status post mechanical fall at home.  Neurovascular bundle intact.  Orthopedic consulted, after preop cardiac clearance underwent right hip anterior hip arthroplasty on 7/5.  Heparin drip had been held for several hours prior to procedure.  As per communication by orthopedic MD, home anticoagulation to resume from 7/6.  S/p mechanical fall: At baseline ambulates with the help of a walker or a cane.  Denies history of frequent falls.  CT head, neck and L-spine without acute findings.  Therapies evaluation post surgery pending.  Will likely need  SNF.  Acute blood loss anemia: Hemoglobin dropped from 14.5 on admission to 11.3 today, likely related to the hip fracture.  No overt bleeding noted.  Stable.  Follow CBC daily.  Essential hypertension: Reasonably controlled.  Continue metoprolol 25 mg twice daily.  Hyperlipidemia: Continue atorvastatin.  History of lower extremity DVT: Reports during CVA a couple of years ago.  Has been on Xarelto since.  Reportedly has been holding it for 4 days PTA for an outpatient injection procedure.  As per orthopedic MD, resume prior home full anticoagulation from 7/6.  Pharmacy aware.  Nonobstructive CAD, history of idiopathic cardiac arrest, V. tach, s/p ICD, chronic diastolic CHF: Reportedly follows with Dr. Sherre Lain, Cardiologist in [place].  Gives history of chronic DOE (unchanged) and what appears to be stable angina on exertion with infrequent use of sublingual NTG-last use was approximately a month ago.  Appears stable and CHF clinically compensated. Continue statins and beta-blockers.  Cardiology saw patient on 7/4 and indicated that she was an acceptable risk, mild to moderate for hip procedure.  Anxiety and depression: Stable without suicidal or homicidal ideations.  Continue Zoloft.  Asthma: Stable without clinical bronchospasm.  GERD without esophagitis: Pepcid.  Hypokalemia: Replaced.  Magnesium normal.  Seizure disorder: Seizure precautions.  Continue Keppra and Topamax.  History of CVA with residual right-sided weakness  Incidental LLQ abnormality on CT abdomen: As noted below.  Close outpatient follow-up with PCP regarding further evaluation and management.  "5.2 x 3.4 x 4.8 centimeters fluid attenuation structure in the LEFT LOWER QUADRANT, possibly representing a seroma or lymphocele. Less likely the findings  could be related to ovarian mass. Recommend further evaluation with elective CT of the abdomen and pelvis with intravenous contrast. 6. 1.5 centimeter low-attenuation  mass or lymph node in the LEFT external iliac chain."  Body mass index is 33.11 kg/m./Obesity   DVT prophylaxis: Xarelto to be initiated from 7/6.  SCDs. Code Status: Full Family Communication: None at bedside Disposition:  Status is: Inpatient  Remains inpatient appropriate because:Inpatient level of care appropriate due to severity of illness   Dispo: The patient is from: Home              Anticipated d/c is to: SNF              Anticipated d/c date is: > 3 days              Patient currently is not medically stable to d/c.        Consultants:   Orthopedics Cardiology  Procedures:   Right Hip Anterior Hip Hemiarthroplasty on 7/5  Antimicrobials:   None   Subjective:  Patient seen this morning prior to procedure.  No chest pain or dyspnea.  Had severe right hip pain.  Objective:   Vitals:   02/27/20 1334 02/27/20 1510 02/27/20 1642 02/27/20 1743  BP: 109/73 (!) 95/41 94/65 102/60  Pulse: 77 72 73 80  Resp:  16 17 16   Temp:  97.7 F (36.5 C) 97.6 F (36.4 C) (!) 97.5 F (36.4 C)  TempSrc:  Oral Oral Oral  SpO2: 100% 98% 98% 96%  Weight:      Height:        General exam: Pleasant young female, moderately built and obese lying comfortably propped up in bed in some painful distress Respiratory system: Clear to auscultation.  No increased work of breathing. Cardiovascular system: S1 & S2 heard, RRR. No JVD, murmurs, rubs, gallops or clicks. No pedal edema.  Telemetry personally reviewed: SB in the mid 50s-SR in the 60s. Gastrointestinal system: Abdomen is nondistended, soft and nontender. No organomegaly or masses felt. Normal bowel sounds heard. Central nervous system: Alert and oriented. No focal neurological deficits. Extremities: Symmetric 5 x 5 power.  Right lower extremity movements restricted secondary to pain. Skin: No rashes, lesions or ulcers Psychiatry: Judgement and insight appear normal. Mood & affect appropriate.     Data Reviewed:   I  have personally reviewed following labs and imaging studies   CBC: Recent Labs  Lab 02/25/20 1034 02/25/20 1034 02/26/20 0407 02/26/20 1647 02/27/20 1542  WBC 8.2   < > 5.6 6.0 9.6  NEUTROABS 6.3  --   --   --   --   HGB 14.5   < > 11.3* 12.1 11.9*  HCT 44.6   < > 35.8* 36.1 34.3*  MCV 90.3   < > 92.3 90.0 86.0  PLT 344   < > 298 299 281   < > = values in this interval not displayed.    Basic Metabolic Panel: Recent Labs  Lab 02/25/20 1034 02/26/20 0407 02/27/20 1542  NA 142 142 140  K 2.9* 4.3 4.4  CL 105 108 107  CO2 25 30 21*  GLUCOSE 96 114* 165*  BUN 17 15 17   CREATININE 0.79 0.83 0.85  CALCIUM 8.9 8.4* 8.4*  MG  --   --  2.3    Liver Function Tests: Recent Labs  Lab 02/25/20 1034  AST 28  ALT 29  ALKPHOS 125  BILITOT 0.8  PROT 7.6  ALBUMIN 3.9  CBG: No results for input(s): GLUCAP in the last 168 hours.  Microbiology Studies:   Recent Results (from the past 240 hour(s))  SARS Coronavirus 2 by RT PCR (hospital order, performed in [facility] hospital lab) Nasopharyngeal Nasopharyngeal Swab     Status: None   Collection Time: 02/25/20 12:49 PM   Specimen: Nasopharyngeal Swab  Result Value Ref Range Status   SARS Coronavirus 2 NEGATIVE NEGATIVE Final    Comment: (NOTE) SARS-CoV-2 target nucleic acids are NOT DETECTED.  The SARS-CoV-2 RNA is generally detectable in upper and lower respiratory specimens during the acute phase of infection. The lowest concentration of SARS-CoV-2 viral copies this assay can detect is 250 copies / mL. A negative result does not preclude SARS-CoV-2 infection and should not be used as the sole basis for treatment or other patient management decisions.  A negative result may occur with improper specimen collection / handling, submission of specimen other than nasopharyngeal swab, presence of viral mutation(s) within the areas targeted by this assay, and inadequate number of viral copies (<250 copies / mL). A  negative result must be combined with clinical observations, patient history, and epidemiological information.  Fact Sheet for Patients:   StrictlyIdeas.no  Fact Sheet for Healthcare Providers: BankingDealers.co.za  This test is not yet approved or  cleared by the [place] FDA and has been authorized for detection and/or diagnosis of SARS-CoV-2 by FDA under an Emergency Use Authorization (EUA).  This EUA will remain in effect (meaning this test can be used) for the duration of the COVID-19 declaration under Section 564(b)(1) of the Act, 21 U.S.C. section 360bbb-3(b)(1), unless the authorization is terminated or revoked sooner.  Performed at [facility], [address] [place], [place] [address]   MRSA PCR Screening     Status: None   Collection Time: 02/26/20  5:16 PM   Specimen: Nasal Mucosa; Nasopharyngeal  Result Value Ref Range Status   MRSA by PCR NEGATIVE NEGATIVE Final    Comment:        The GeneXpert MRSA Assay (FDA approved for NASAL specimens only), is one component of a comprehensive MRSA colonization surveillance program. It is not intended to diagnose MRSA infection nor to guide or monitor treatment for MRSA infections. Performed at [facility], [address], [place], [place] [address]      Radiology Studies:  No results found.   Scheduled Meds:   . atorvastatin  40 mg Oral q1800  . calcium-vitamin D  2 tablet Oral Q breakfast  . cholecalciferol  5,000 Units Oral Daily  . docusate sodium  100 mg Oral BID  . famotidine  20 mg Oral BID  . [START ON 02/28/2020] feeding supplement (ENSURE ENLIVE)  237 mL Oral BID BM  . fluticasone  2 puff Inhalation BID  . furosemide  20 mg Oral BID  . gabapentin  300 mg Oral TID  . levETIRAcetam  500 mg Oral BID  . loratadine  10 mg Oral Daily  . magnesium oxide  400 mg Oral Daily  . metoprolol tartrate  25 mg Oral BID  . [START ON 02/28/2020]  multivitamin with minerals  1 tablet Oral Daily  . [START ON 02/28/2020] rivaroxaban  20 mg Oral Q supper  . sertraline  100 mg Oral Daily  . sodium chloride flush  10-40 mL Intracatheter Q12H  . topiramate  100 mg Oral BID  . tranexamic acid (CYKLOKAPRON) topical - INTRAOP  2,000 mg Topical 30 min Pre-Op    Continuous  Infusions:   . clindamycin (CLEOCIN) IV 600 mg (02/27/20 1447)  . lactated ringers 75 mL/hr at 02/27/20 1447     LOS: 2 days     Vernell Leep, MD, [facility], [facility]. [facility]    To contact the attending provider between 7A-7P or the covering provider during after hours 7P-7A, please log into the web site [email or web address] and access using universal [place] password for that web site. If you do not have the password, please call the hospital operator.  02/27/2020, 6:11 PM

## 2020-02-27 NOTE — Transfer of Care (Signed)
Immediate Anesthesia Transfer of Care Note  Patient: Claire Mcdonald  Procedure(s) Performed: ARTHROPLASTY BIPOLAR HIP (HEMIARTHROPLASTY) (Right Hip)  Patient Location: PACU  Anesthesia Type:General  Level of Consciousness: sedated  Airway & Oxygen Therapy: Patient Spontanous Breathing and Patient connected to face mask oxygen  Post-op Assessment: Report given to RN and Post -op Vital signs reviewed and stable  Post vital signs: Reviewed and stable  Last Vitals:  Vitals Value Taken Time  BP 123/61 02/27/20 1116  Temp 36.3 C 02/27/20 1115  Pulse 67 02/27/20 1128  Resp 10 02/27/20 1128  SpO2 100 % 02/27/20 1128  Vitals shown include unvalidated device data.  Last Pain:  Vitals:   02/27/20 1115  TempSrc:   PainSc: Asleep      Patients Stated Pain Goal: 0 (09/38/18 2993)  Complications: No complications documented.

## 2020-02-27 NOTE — Progress Notes (Signed)
Initial Nutrition Assessment  DOCUMENTATION CODES:   Obesity unspecified  INTERVENTION:   Ensure Enlive po BID, each supplement provides 350 kcal and 20 grams of protein  MVI daily   Recommend oscal with D po BID  NUTRITION DIAGNOSIS:   Increased nutrient needs related to hip fracture as evidenced by increased estimated needs.  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 tach, chronic CHF, DVT on Xarelto, nonobstructive CAD, diverticulosis, pancreatitis and obesity sustained a mechanical fall on day of admission, was unable to get up and presented to ED with right hip pain.  Admitted for right femoral neck fracture s/p surgical fixation 7/5   Unable to see patient today as pt in OR at time of RD visit. Pt with increased estimated needs r/t hip fracture. RD will add supplements and MVI to help pt meet her estimated needs. Per chart, pt with weight gain pta. RD will obtain nutrition related history and exam at follow-up.   Medications reviewed and include: vitamin D, colace, pepcid, lasix, Mg oxide, LRS @75ml /hr  Labs reviewed:   NUTRITION - FOCUSED PHYSICAL EXAM: Unable to perform at this time   Diet Order:   Diet Order            Diet regular Room service appropriate? Yes; Fluid consistency: Thin  Diet effective now                EDUCATION NEEDS:   Not appropriate for education at this time  Skin:  Skin Assessment: Reviewed RN Assessment (ecchymosis, incision R  hip)  Last BM:  7/3  Height:   Ht Readings from Last 1 Encounters:  02/25/20 5\' 2"  (1.575 m)    Weight:   Wt Readings from Last 1 Encounters:  02/25/20 82.1 kg    Ideal Body Weight:  50 kg  BMI:  Body mass index is 33.11 kg/m.  Estimated Nutritional Needs:   Kcal:  1700-2000kcal/day  Protein:  85-100g/day  Fluid:  1.5-1.8L/day  Koleen Distance MS, RD, LDN Please refer to [facility] for RD and/or RD on-call/weekend/after hours pager

## 2020-02-27 NOTE — Anesthesia Procedure Notes (Signed)
Procedure Name: Intubation Date/Time: 02/27/2020 8:44 AM Performed by: Johnna Acosta, CRNA Pre-anesthesia Checklist: Patient identified, Emergency Drugs available, Suction available, Patient being monitored and Timeout performed Patient Re-evaluated:Patient Re-evaluated prior to induction Oxygen Delivery Method: Circle system utilized Preoxygenation: Pre-oxygenation with 100% oxygen Induction Type: IV induction Ventilation: Two handed mask ventilation required and Mask ventilation without difficulty Laryngoscope Size: McGraph and 3 Grade View: Grade I Tube type: Oral Tube size: 7.0 mm Number of attempts: 1 Airway Equipment and Method: Stylet,  Video-laryngoscopy and Oral airway Placement Confirmation: ETT inserted through vocal cords under direct vision,  positive ETCO2 and breath sounds checked- equal and bilateral Secured at: 21 cm Tube secured with: Tape Dental Injury: Teeth and Oropharynx as per pre-operative assessment  Difficulty Due To: Difficulty was anticipated Comments: RSI due to intubation in bed

## 2020-02-28 ENCOUNTER — Ambulatory Visit: Payer: Medicaid Other | Admitting: Pain Medicine

## 2020-02-28 ENCOUNTER — Encounter: Payer: Self-pay | Admitting: Orthopedic Surgery

## 2020-02-28 LAB — CBC
HCT: 29.2 % — ABNORMAL LOW (ref 36.0–46.0)
Hemoglobin: 9.6 g/dL — ABNORMAL LOW (ref 12.0–15.0)
MCH: 29.6 pg (ref 26.0–34.0)
MCHC: 32.9 g/dL (ref 30.0–36.0)
MCV: 90.1 fL (ref 80.0–100.0)
Platelets: 283 10*3/uL (ref 150–400)
RBC: 3.24 MIL/uL — ABNORMAL LOW (ref 3.87–5.11)
RDW: 14.3 % (ref 11.5–15.5)
WBC: 8.6 10*3/uL (ref 4.0–10.5)
nRBC: 0 % (ref 0.0–0.2)

## 2020-02-28 LAB — BASIC METABOLIC PANEL
Anion gap: 9 (ref 5–15)
BUN: 15 mg/dL (ref 6–20)
CO2: 25 mmol/L (ref 22–32)
Calcium: 8.1 mg/dL — ABNORMAL LOW (ref 8.9–10.3)
Chloride: 106 mmol/L (ref 98–111)
Creatinine, Ser: 0.64 mg/dL (ref 0.44–1.00)
GFR calc Af Amer: >60 mL/min (ref >60)
GFR calc non Af Amer: >60 mL/min (ref >60)
Glucose, Bld: 133 mg/dL — ABNORMAL HIGH (ref 70–99)
Potassium: 3.7 mmol/L (ref 3.5–5.1)
Sodium: 140 mmol/L (ref 135–145)

## 2020-02-28 NOTE — Anesthesia Postprocedure Evaluation (Signed)
Anesthesia Post Note  Patient: Claire Mcdonald  Procedure(s) Performed: ARTHROPLASTY BIPOLAR HIP (HEMIARTHROPLASTY) (Right Hip)  Patient location during evaluation: PACU Anesthesia Type: General Level of consciousness: awake and alert and oriented Pain management: pain level controlled Vital Signs Assessment: post-procedure vital signs reviewed and stable Respiratory status: spontaneous breathing Cardiovascular status: blood pressure returned to baseline Anesthetic complications: no   No complications documented.   Last Vitals:  Vitals:   02/27/20 2023 02/28/20 0006  BP: 100/61 (!) 102/50  Pulse: 85 74  Resp: 16 16  Temp: 36.6 C 36.6 C  SpO2: 96% 100%    Last Pain:  Vitals:   02/28/20 0006  TempSrc: Oral  PainSc:                  Kasyn Stouffer

## 2020-02-28 NOTE — Progress Notes (Signed)
PROGRESS NOTE   Claire Mcdonald  YQI:[id number]    DOB: 24-Apr-1966    DOA: 02/25/2020  PCP: [facility] [place]    Chief Complaint  Patient presents with  . 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 tach, chronic CHF, DVT on Xarelto, nonobstructive CAD, diverticulosis, pancreatitis and obesity sustained a mechanical fall on day of admission, was unable to get up and presented to ED with right hip pain.  Admitted for right femoral neck fracture, underwent right hip anterior hip hemiarthroplasty on 7/5 after preop cardiac clearance.   Assessment & Plan:   Right closed femoral neck fracture: Sustained status post mechanical fall at home.  Neurovascular bundle intact.  Orthopedic consulted, after preop cardiac clearance underwent right hip anterior hip arthroplasty on 7/5.  Heparin drip had been held for several hours prior to procedure.  Anticoagulation with Xarelto has been resumed.  PT and OT evaluation.  S/p mechanical fall: At baseline ambulates with the help of a walker or a cane.  Denies history of frequent falls.  CT head, neck and L-spine without acute findings.  Therapies evaluation post surgery pending.   Acute blood loss anemia: Drop in hemoglobin noted.  Most likely due to surgical loss.  No other overt bleeding is noted.  Continue to monitor.  Transfuse if it drops below 7.    Essential hypertension: Blood pressure noted to be borderline low.  Noted to be on metoprolol.  Will hold her dose today.  Also noted to be on furosemide which will also be held.    Hyperlipidemia: Continue atorvastatin.  History of lower extremity DVT: Reports during CVA a couple of years ago.  Has  been on Xarelto since.  Reportedly has been holding it for 4 days PTA for an outpatient injection procedure.  As per orthopedic MD, resume prior home full anticoagulation from 7/6.  Pharmacy aware.  Nonobstructive CAD, history of idiopathic cardiac arrest, V. tach, s/p ICD, chronic diastolic CHF: Reportedly follows with Dr. Sherre Lain, Cardiologist in [place].  Gives history of chronic DOE (unchanged) and what appears to be stable angina on exertion with infrequent use of sublingual NTG-last use was approximately a month ago.  Appears stable and CHF clinically compensated. Continue statins and beta-blockers.  Cardiology saw patient on 7/4 and indicated that she was an acceptable risk, mild to moderate for hip procedure.  Anxiety and depression: Stable without suicidal or homicidal ideations.  Continue Zoloft.  Asthma: Stable without clinical bronchospasm.  GERD without esophagitis: Pepcid.  Hypokalemia: Replaced.  Magnesium normal.  Seizure disorder: Seizure precautions.  Continue Keppra and Topamax.  History of CVA with residual right-sided weakness  Incidental LLQ abnormality on CT abdomen: As noted below.  Close outpatient follow-up with PCP regarding further evaluation and management.  "5.2 x 3.4 x 4.8 centimeters fluid attenuation structure in the LEFT LOWER QUADRANT, possibly representing a seroma or lymphocele. Less likely the findings could be related to ovarian mass. Recommend further evaluation with elective CT of the abdomen and pelvis with intravenous contrast. 6. 1.5 centimeter low-attenuation mass or lymph node in the LEFT external iliac chain."  Body mass index is 33.11 kg/m./Obesity   DVT prophylaxis: Xarelto  Code Status: Full Family Communication: None at bedside Disposition:  Status is: Inpatient  Remains inpatient appropriate because:Inpatient level of care appropriate due to severity of illness   Dispo: The patient is from: Home              Anticipated  d/c is to: SNF              Anticipated d/c date is: > 3 days              Patient currently is not medically stable to d/c.        Consultants:   Orthopedics Cardiology  Procedures:   Right Hip Anterior Hip Hemiarthroplasty on 7/5  Antimicrobials:   None   Subjective:  Patient states that pain is poorly controlled.  She is working with physical therapy.  Denies any shortness of breath.  No nausea vomiting.  Objective:   Vitals:   02/28/20 0006 02/28/20 0504 02/28/20 0719 02/28/20 1141  BP: (!) 102/50 94/64 (!) 84/63 100/69  Pulse: 74 64 64 79  Resp: 16 14 18 18   Temp: 97.8 F (36.6 C) 97.8 F (36.6 C) 97.7 F (36.5 C) 97.7 F (36.5 C)  TempSrc: Oral Oral Oral Oral  SpO2: 100% 93% 100% 100%  Weight:      Height:        General appearance: Awake alert.  In no distress Resp: Clear to auscultation bilaterally.  Normal effort Cardio: S1-S2 is normal regular.  No S3-S4.  No rubs murmurs or bruit GI: Abdomen is soft.  Nontender nondistended.  Bowel sounds are present normal.  No masses organomegaly Extremities: No edema.  Able to move her extremities.  Somewhat limited on the right leg due to recent surgery. Neurologic: Alert and oriented x3.  No focal neurological deficits.       Data Reviewed:   I have personally reviewed following labs and imaging studies   CBC: Recent Labs  Lab 02/25/20 1034 02/26/20 0407 02/26/20 1647 02/27/20 1542 02/28/20 0405  WBC 8.2   < > 6.0 9.6 8.6  NEUTROABS 6.3  --   --   --   --   HGB 14.5   < > 12.1 11.9* 9.6*  HCT 44.6   < > 36.1 34.3* 29.2*  MCV 90.3   < > 90.0 86.0 90.1  PLT 344   < > 299 281 283   < > = values in this interval not displayed.    Basic Metabolic Panel: Recent Labs  Lab 02/26/20 0407 02/27/20 1542 02/28/20 0405  NA 142 140 140  K 4.3 4.4 3.7  CL 108 107 106  CO2 30 21* 25  GLUCOSE 114* 165* 133*  BUN 15 17 15   CREATININE 0.83 0.85 0.64  CALCIUM 8.4* 8.4* 8.1*  MG  --  2.3  --      Liver Function Tests: Recent Labs  Lab 02/25/20 1034  AST 28  ALT 29  ALKPHOS 125  BILITOT 0.8  PROT 7.6  ALBUMIN 3.9     Microbiology Studies:   Recent Results (from the past 240 hour(s))  SARS Coronavirus 2 by RT PCR (hospital order, performed in [facility] hospital lab) Nasopharyngeal Nasopharyngeal Swab     Status: None   Collection Time: 02/25/20 12:49 PM   Specimen: Nasopharyngeal Swab  Result Value Ref Range Status   SARS Coronavirus 2 NEGATIVE NEGATIVE Final    Comment: (NOTE) SARS-CoV-2 target nucleic acids are NOT DETECTED.  The SARS-CoV-2 RNA is generally detectable in upper and lower respiratory specimens during the acute phase  of infection. The lowest concentration of SARS-CoV-2 viral copies this assay can detect is 250 copies / mL. A negative result does not preclude SARS-CoV-2 infection and should not be used as the sole basis for treatment or other patient management decisions.  A negative result may occur with improper specimen collection / handling, submission of specimen other than nasopharyngeal swab, presence of viral mutation(s) within the areas targeted by this assay, and inadequate number of viral copies (<250 copies / mL). A negative result must be combined with clinical observations, patient history, and epidemiological information.  Fact Sheet for Patients:   StrictlyIdeas.no  Fact Sheet for Healthcare Providers: BankingDealers.co.za  This test is not yet approved or  cleared by the [place] FDA and has been authorized for detection and/or diagnosis of SARS-CoV-2 by FDA under an Emergency Use Authorization (EUA).  This EUA will remain in effect (meaning this test can be used) for the duration of the COVID-19 declaration under Section 564(b)(1) of the Act, 21 U.S.C. section 360bbb-3(b)(1), unless the authorization is terminated or revoked sooner.  Performed at [facility],  [address] [place], [place] [address]   MRSA PCR Screening     Status: None   Collection Time: 02/26/20  5:16 PM   Specimen: Nasal Mucosa; Nasopharyngeal  Result Value Ref Range Status   MRSA by PCR NEGATIVE NEGATIVE Final    Comment:        The GeneXpert MRSA Assay (FDA approved for NASAL specimens only), is one component of a comprehensive MRSA colonization surveillance program. It is not intended to diagnose MRSA infection nor to guide or monitor treatment for MRSA infections. Performed at [facility], [address] [place], [place] [address]      Radiology Studies:  DG HIP OPERATIVE UNILAT WITH PELVIS RIGHT  Result Date: 02/27/2020 CLINICAL DATA:  RIGHT hip surgery, hip replacement EXAM: OPERATIVE RIGHT HIP (WITH PELVIS IF PERFORMED) 3 VIEWS TECHNIQUE: Fluoroscopic spot image(s) were submitted for interpretation post-operatively. COMPARISON:  February 25, 2020 FINDINGS: Post RIGHT hip hemi arthroplasty. No complicating features. AP projection of the hip arthroplasty is submitted. Initial image shows redemonstration of sub capital fracture of the RIGHT femoral neck. FLUOROSCOPY TIME:  8 seconds IMPRESSION: Post RIGHT hip hemiarthroplasty following subcapital fracture without complicating features on AP projections. Electronically Signed   By: Zetta Bills M.D.   On: 02/27/2020 13:33     Scheduled Meds:   . atorvastatin  40 mg Oral q1800  . calcium-vitamin D  2 tablet Oral Q breakfast  . cholecalciferol  5,000 Units Oral Daily  . docusate sodium  100 mg Oral BID  . famotidine  20 mg Oral BID  . feeding supplement (ENSURE ENLIVE)  237 mL Oral BID BM  . fluticasone  2 puff Inhalation BID  . furosemide  20 mg Oral BID  . gabapentin  300 mg Oral TID  . levETIRAcetam  500 mg Oral BID  . loratadine  10 mg Oral Daily  . magnesium oxide  400 mg Oral Daily  . metoprolol tartrate  25 mg Oral BID  . multivitamin with minerals  1 tablet Oral Daily  . rivaroxaban  20  mg Oral Q supper  . sertraline  100 mg Oral Daily  . sodium chloride flush  10-40 mL Intracatheter Q12H  . topiramate  100 mg Oral BID  . tranexamic acid (CYKLOKAPRON) topical - INTRAOP  2,000 mg Topical 30 min Pre-Op    Continuous Infusions:   . lactated ringers  75 mL/hr at 02/27/20 1447     LOS: 3 days     Bonnielee Haff,  [facility]    To contact the attending provider between 7A-7P or the covering provider during after hours 7P-7A, please log into the web site [email or web address] and access using universal [place] password for that web site. If you do not have the password, please call the hospital operator.  02/28/2020, 1:56 PM

## 2020-02-28 NOTE — Progress Notes (Signed)
Physical Therapy Treatment Patient Details Name: Claire Mcdonald MRN: [id number] DOB: Apr 09, 1966 Today's Date: 02/28/2020    History of Present Illness Claire Mcdonald is a 54 y/o female that is POD 1 of R anterior hip hemiarthroplasty after sustaining a mechanical fall and subsequent R subcapital fracture. 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    PT Comments    Pt seated in recliner chair upon arrival to room and agreeable to PT session this afternoon. Pt performed transfers with min A. Pt ambulated 250 feet using RW initially with min A then progressed to CGA for safety. Pt ambulates with decreased gait speed. Pt continues to require assist for RLE therex and bed mobility secondary to muscle weakness.Pt given exercise booklet and educated on frequency of exercises and anterior hip precaution. Pt voiced understanding however will need follow up. Due to improved performance this afternoon, update discharge recommendation to home with HHPT.    Follow Up Recommendations  Home health PT     Equipment Recommendations  3in1 (PT)    Recommendations for Other Services       Precautions / Restrictions Precautions Precautions: Anterior Hip;Fall Precaution Booklet Issued: Yes (comment) Precaution Comments: reviewed with patient Restrictions Weight Bearing Restrictions: Yes RLE Weight Bearing: Weight bearing as tolerated    Mobility  Bed Mobility Overal bed mobility: Needs Assistance Bed Mobility: Sit to Supine       Sit to supine: Max assist   General bed mobility comments: max A for RLE elevation onto bed; verbal cues for sequencing  Transfers Overall transfer level: Needs assistance Equipment used: Rolling walker (2 wheeled) Transfers: Sit to/from Stand Sit to Stand: Min assist         General  transfer comment: Verbal cues for hand and foot placement and to power through BLE and arms on armrests of chair. Min A for boosting to stand and for steadying during transition of hands from armrests to RW.  Ambulation/Gait Ambulation/Gait assistance: Min guard Gait Distance (Feet): 250 Feet Assistive device: Rolling walker (2 wheeled) Gait Pattern/deviations: Step-through pattern;Decreased step length - right;Decreased step length - left;Trunk flexed Gait velocity: 10' in 30"   General Gait Details: ambulated 250 feet around nurses station initially with min A then progressed to CGA for safety; pt with reciprocal gait pattern with flexed trunk and heavy reliance on BUE on RW; required verbal cues for improved posture    Stairs             Wheelchair Mobility    Modified Rankin (Stroke Patients Only)       Balance Overall balance assessment: Needs assistance Sitting-balance support: Feet supported;Single extremity supported Sitting balance-Leahy Scale: Good Sitting balance - Comments: noted no overt LOB with edge of bed sitting   Standing balance support: Bilateral upper extremity supported Standing balance-Leahy Scale: Good Standing balance comment: pt able to stand with BUE on RW with CGA while donning of brief                            Cognition Arousal/Alertness: Awake/alert Behavior During Therapy: WFL for tasks assessed/performed Overall Cognitive Status: Within Functional Limits for tasks assessed  Exercises Other Exercises Other Exercises: in bed with HOB elevated: GS x 10, AAROM SAQ and heel slides x 10 on RLE    General Comments        Pertinent Vitals/Pain Pain Assessment: 0-10 Pain Score: 9  Pain Location: R hip Pain Descriptors / Indicators: Discomfort;Operative site guarding Pain Intervention(s): Monitored during session;Repositioned;Ice applied    Home Living                       Prior Function            PT Goals (current goals can now be found in the care plan section) Acute Rehab PT Goals Patient Stated Goal: to not have pain PT Goal Formulation: With patient Time For Goal Achievement: 03/13/20 Potential to Achieve Goals: Good Progress towards PT goals: Progressing toward goals    Frequency    BID      PT Plan Discharge plan needs to be updated    Co-evaluation              AM-PAC PT "6 Clicks" Mobility   Outcome Measure  Help needed turning from your back to your side while in a flat bed without using bedrails?: A Little Help needed moving from lying on your back to sitting on the side of a flat bed without using bedrails?: A Lot Help needed moving to and from a bed to a chair (including a wheelchair)?: A Little Help needed standing up from a chair using your arms (e.g., wheelchair or bedside chair)?: A Little Help needed to walk in hospital room?: A Little Help needed climbing 3-5 steps with a railing? : A Lot 6 Click Score: 16    End of Session Equipment Utilized During Treatment: Gait belt Activity Tolerance: Patient limited by fatigue;Patient limited by pain Patient left: in bed;with call bell/phone within reach;with bed alarm set;with SCD's reapplied Nurse Communication: Mobility status PT Visit Diagnosis: Unsteadiness on feet (R26.81);Muscle weakness (generalized) (M62.81);History of falling (Z91.81);Pain Pain - Right/Left: Right Pain - part of body: Hip     Time: 5974-1638 PT Time Calculation (min) (ACUTE ONLY): 53 min  Charges:                        Vale Haven, SPT   Vale Haven 02/28/2020, 4:11 PM

## 2020-02-28 NOTE — Evaluation (Signed)
Physical Therapy Evaluation Patient Details Name: Claire Mcdonald MRN: [id number] DOB: 05/15/1966 Today's Date: 02/28/2020   History of Present Illness  Claire Mcdonald is a 54 y/o female that is POD 1 of R anterior hip hemiarthroplasty after sustaining a mechanical fall and subsequent R subcapital fracture. 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  Clinical Impression  Pt awake in bed upon arrival to room and agreeable to participate in PT this morning. At this time, pt requires max A for RLE management during bed mobility and mod A for transfer from elevated height of bed. Pt with residual weakness and sensory deficits on RLE from previous stroke. Pt ambulated 2 feet using RW with decreased step lengths bilaterally and decreased gait speed requiring min A for steadying and safety. Limiting factors at this time are pain and fatigue. Overall, pt required max verbal cues for all activities and demonstrated decreased occasional decreased response time to commands. Pt would benefit from continued skilled PT to address current muscle weakness, decreased functional mobility and endurance this acute stay and rehab at d/c from hospital to optimize return to PLOF and maximize functional mobility and independence.    Follow Up Recommendations SNF    Equipment Recommendations  3in1 (PT)    Recommendations for Other Services       Precautions / Restrictions Precautions Precautions: Anterior Hip;Fall Restrictions Weight Bearing Restrictions: Yes RLE Weight Bearing: Weight bearing as tolerated      Mobility  Bed Mobility Overal bed mobility: Needs Assistance Bed Mobility: Supine to Sit     Supine to sit: Max assist;HOB elevated     General bed mobility comments: HOB elevated; max A for RLE management to and over edge of  bed  Transfers Overall transfer level: Needs assistance Equipment used: Rolling walker (2 wheeled) Transfers: Sit to/from Stand Sit to Stand: Mod assist         General transfer comment: Elevated height of bed; pt required mod A for sit to stand transfer for boost into standing. Max verbal cues for hand and foot placement for safe transfer. Min A for eccentric control on descent.  Ambulation/Gait Ambulation/Gait assistance: Min assist Gait Distance (Feet): 2 Feet Assistive device: Rolling walker (2 wheeled) Gait Pattern/deviations: Step-to pattern;Decreased step length - right;Decreased step length - left Gait velocity: decreased   General Gait Details: Pt ambulated 2 feet using RW with min A for balance and safety. Noted decreased step lengths bilaterally and increased verbal cues for sequencing and RW management.  Stairs            Wheelchair Mobility    Modified Rankin (Stroke Patients Only)       Balance Overall balance assessment: Needs assistance Sitting-balance support: Feet supported;Single extremity supported (LUE) Sitting balance-Leahy Scale: Good Sitting balance - Comments: Noted heavy LUE usage with edge of bed sitting with bilat feet supported,    Standing balance support: Bilateral upper extremity supported Standing balance-Leahy Scale: Fair Standing balance comment: Pt with weight shift to L and BUE usage on RW with min A for steadying.                             Pertinent Vitals/Pain Pain Assessment: 0-10 Pain Score: 10-Worst pain ever Pain Location: R hip Pain Descriptors / Indicators: Discomfort;Operative site guarding Pain Intervention(s): Limited activity  within patient's tolerance;Monitored during session;Repositioned;Ice applied;Patient requesting pain meds-RN notified;RN gave pain meds during session    Home Living Family/patient expects to be discharged to:: Private residence Living Arrangements: Spouse/significant  other;Children (adult son) Available Help at Discharge: Family Type of Home: House (modular home) Home Access: Stairs to enter Entrance Stairs-Rails: Right;Left (cannot reach both) Entrance Stairs-Number of Steps: 6-8 (per boyfriend) Home Layout: One level Home Equipment: Walker - 2 wheels;Walker - 4 wheels;Cane - quad Additional Comments: Spoke to pt's boyfriend in room to clarify some answers that the pt was unclear on.    Prior Function Level of Independence: Independent with assistive device(s)         Comments: Pt reports usage of quad cane or RW for ambulation but states that she did not need assistance. Pt also states that her boyfried does shopping and chores and that she ambulates limited community distances and is primarily a household ambulator. Pt reports ability to bathe herself.     Hand Dominance        Extremity/Trunk Assessment   Upper Extremity Assessment Upper Extremity Assessment: Generalized weakness (LUE 4-/5 overall; RUE 3+/5 overall)    Lower Extremity Assessment Lower Extremity Assessment: Generalized weakness;RLE deficits/detail (LLE: 4-/5 overall; RLE 3+/5 overall) RLE Deficits / Details: Pt with decreased sensation and mobility/strength secondary to previous CVA resulting in R sided weakness. RLE: Unable to fully assess due to pain RLE Sensation: decreased light touch       Communication   Communication: No difficulties  Cognition Arousal/Alertness: Awake/alert Behavior During Therapy: WFL for tasks assessed/performed Overall Cognitive Status: Within Functional Limits for tasks assessed                                 General Comments: Pt A&O x 4. Pt is slow to respond to some commands and required increased time/explanation to understand. Pt also noted to give varying information, sometimes opposite, regarding some questions (ex: pain, house set up, etc.)      General Comments      Exercises Other Exercises Other  Exercises: in supine: pt performed active AP and QS  bilat x 10, and AAROM hip ab/add x 10 on RLE.   Assessment/Plan    PT Assessment Patient needs continued PT services  PT Problem List Decreased strength;Decreased activity tolerance;Decreased balance;Decreased mobility;Impaired sensation;Pain       PT Treatment Interventions DME instruction;Gait training;Stair training;Functional mobility training;Therapeutic activities;Therapeutic exercise;Balance training;Patient/family education    PT Goals (Current goals can be found in the Care Plan section)  Acute Rehab PT Goals Patient Stated Goal: to not have pain PT Goal Formulation: With patient Time For Goal Achievement: 03/13/20 Potential to Achieve Goals: Good    Frequency BID   Barriers to discharge        Co-evaluation               AM-PAC PT "6 Clicks" Mobility  Outcome Measure Help needed turning from your back to your side while in a flat bed without using bedrails?: A Lot Help needed moving from lying on your back to sitting on the side of a flat bed without using bedrails?: A Lot Help needed moving to and from a bed to a chair (including a wheelchair)?: A Little Help needed standing up from a chair using your arms (e.g., wheelchair or bedside chair)?: A Little Help needed to walk in hospital room?: A Lot Help needed climbing 3-5 steps with  a railing? : A Lot 6 Click Score: 14    End of Session Equipment Utilized During Treatment: Gait belt Activity Tolerance: Patient limited by pain;Patient limited by fatigue Patient left: in chair;with call bell/phone within reach;with chair alarm set;with nursing/sitter in room;with SCD's reapplied Nurse Communication: Mobility status PT Visit Diagnosis: Unsteadiness on feet (R26.81);Muscle weakness (generalized) (M62.81);History of falling (Z91.81);Pain Pain - Right/Left: Right Pain - part of body: Hip    Time: 0907-1000 PT Time Calculation (min) (ACUTE ONLY): 53  min   Charges:              Vale Haven, SPT  Vale Haven 02/28/2020, 11:47 AM

## 2020-02-28 NOTE — Progress Notes (Signed)
Subjective:  Patient reports pain as mild.  No other complaints.  Objective:   VITALS:   Vitals:   02/28/20 0006 02/28/20 0504 02/28/20 0719 02/28/20 1141  BP: (!) 102/50 94/64 (!) 84/63 100/69  Pulse: 74 64 64 79  Resp: 16 14 18 18   Temp: 97.8 F (36.6 C) 97.8 F (36.6 C) 97.7 F (36.5 C) 97.7 F (36.5 C)  TempSrc: Oral Oral Oral Oral  SpO2: 100% 93% 100% 100%  Weight:      Height:        PHYSICAL EXAM:  ABD soft Sensation intact distally Dorsiflexion/Plantar flexion intact Incision: dressing C/D/I No cellulitis present Compartment soft  LABS  Results for orders placed or performed during the hospital encounter of 02/25/20 (from the past 24 hour(s))  CBC     Status: Abnormal   Collection Time: 02/27/20  3:42 PM  Result Value Ref Range   WBC 9.6 4.0 - 10.5 K/uL   RBC 3.99 3.87 - 5.11 MIL/uL   Hemoglobin 11.9 (L) 12.0 - 15.0 g/dL   HCT 34.3 (L) 36 - 46 %   MCV 86.0 80.0 - 100.0 fL   MCH 29.8 26.0 - 34.0 pg   MCHC 34.7 30.0 - 36.0 g/dL   RDW 14.2 11.5 - 15.5 %   Platelets 281 150 - 400 K/uL   nRBC 0.0 0.0 - 0.2 %  Basic metabolic panel     Status: Abnormal   Collection Time: 02/27/20  3:42 PM  Result Value Ref Range   Sodium 140 135 - 145 mmol/L   Potassium 4.4 3.5 - 5.1 mmol/L   Chloride 107 98 - 111 mmol/L   CO2 21 (L) 22 - 32 mmol/L   Glucose, Bld 165 (H) 70 - 99 mg/dL   BUN 17 6 - 20 mg/dL   Creatinine, Ser 0.85 0.44 - 1.00 mg/dL   Calcium 8.4 (L) 8.9 - 10.3 mg/dL   GFR calc non Af Amer >60 >60 mL/min   GFR calc Af Amer >60 >60 mL/min   Anion gap 12 5 - 15  Magnesium     Status: None   Collection Time: 02/27/20  3:42 PM  Result Value Ref Range   Magnesium 2.3 1.7 - 2.4 mg/dL  CBC     Status: Abnormal   Collection Time: 02/28/20  4:05 AM  Result Value Ref Range   WBC 8.6 4.0 - 10.5 K/uL   RBC 3.24 (L) 3.87 - 5.11 MIL/uL   Hemoglobin 9.6 (L) 12.0 - 15.0 g/dL   HCT 29.2 (L) 36 - 46 %   MCV 90.1 80.0 - 100.0 fL   MCH 29.6 26.0 - 34.0 pg    MCHC 32.9 30.0 - 36.0 g/dL   RDW 14.3 11.5 - 15.5 %   Platelets 283 150 - 400 K/uL   nRBC 0.0 0.0 - 0.2 %  Basic metabolic panel     Status: Abnormal   Collection Time: 02/28/20  4:05 AM  Result Value Ref Range   Sodium 140 135 - 145 mmol/L   Potassium 3.7 3.5 - 5.1 mmol/L   Chloride 106 98 - 111 mmol/L   CO2 25 22 - 32 mmol/L   Glucose, Bld 133 (H) 70 - 99 mg/dL   BUN 15 6 - 20 mg/dL   Creatinine, Ser 0.64 0.44 - 1.00 mg/dL   Calcium 8.1 (L) 8.9 - 10.3 mg/dL   GFR calc non Af Amer >60 >60 mL/min   GFR calc Af Amer >60 >60 mL/min  Anion gap 9 5 - 15    DG HIP OPERATIVE UNILAT WITH PELVIS RIGHT  Result Date: 02/27/2020 CLINICAL DATA:  RIGHT hip surgery, hip replacement EXAM: OPERATIVE RIGHT HIP (WITH PELVIS IF PERFORMED) 3 VIEWS TECHNIQUE: Fluoroscopic spot image(s) were submitted for interpretation post-operatively. COMPARISON:  February 25, 2020 FINDINGS: Post RIGHT hip hemi arthroplasty. No complicating features. AP projection of the hip arthroplasty is submitted. Initial image shows redemonstration of sub capital fracture of the RIGHT femoral neck. FLUOROSCOPY TIME:  8 seconds IMPRESSION: Post RIGHT hip hemiarthroplasty following subcapital fracture without complicating features on AP projections. Electronically Signed   By: Zetta Bills M.D.   On: 02/27/2020 13:33    Assessment/Plan: 1 Day Post-Op   Principal Problem:   Fracture of femoral neck, right, closed ([place]) Active Problems:   Essential hypertension   Hyperlipidemia   Deep vein thrombosis (DVT) ([facility])   CAD (coronary artery disease)   Anxiety   Asthma   Gastroesophageal reflux disease without esophagitis   Hypokalemia   Seizure ([place])   Fall   History of CVA (cerebrovascular accident)   Abdominal mass   Up with therapy Discharge to SNF when medically stable Follow-up in 12 to 14 days for staple removal   Lovell Sheehan , MD 02/28/2020, 1:02 PM

## 2020-02-28 NOTE — Progress Notes (Deleted)
PROVIDER NOTE: Information contained herein reflects review and annotations entered in association with encounter. Interpretation of such information and data should be left to medically-trained personnel. Information provided to patient can be located elsewhere in the medical record under "Patient Instructions". Document created using STT-dictation technology, any transcriptional errors that may result from process are unintentional.    Patient: Claire Mcdonald  Service Category: Procedure  Provider: [name], MD  DOB: [birth year]  DOS: 02/28/2020  Location: [facility]  MRN: [id number]  Setting: Ambulatory - outpatient  Referring Provider: Milinda Pointer, MD  Type: Established Patient  Specialty: Interventional Pain Management  PCP: [facility] [place]   Primary Reason for Visit: Interventional Pain Management Treatment. CC: No chief complaint on file.  Procedure:          Anesthesia, Analgesia, Anxiolysis:  Type: Thermal Lumbar Facet, Medial Branch Radiofrequency Ablation/Neurotomy  #1  Primary Purpose: Therapeutic Region: Posterolateral Lumbosacral Spine Level: L2, L3, L4, L5, & S1 Medial Branch Level(s). These levels will denervate the L3-4, L4-5, and the L5-S1 lumbar facet joints. Laterality: Left  Type: Moderate (Conscious) Sedation combined with Local Anesthesia Indication(s): Analgesia and Anxiety Route: Intravenous (IV) IV Access: Secured Sedation: Meaningful verbal contact was maintained at all times during the procedure  Local Anesthetic: Lidocaine 1-2%  Position: Prone   Indications: 1. Spondylosis without myelopathy or radiculopathy, lumbosacral region   2. Lumbar facet syndrome (Bilateral) (R>L)   3. Lumbar facet arthropathy   4. 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 DDD (degenerative disc disease), lumbar   6. Chronic low back pain (Primary Area of Pain) (Bilateral) (R>L)    Claire Mcdonald has been dealing with the above chronic  pain for longer than three months and has either failed to respond, was unable to tolerate, or simply did not get enough benefit from other more conservative therapies including, but not limited to: 1. Over-the-counter medications 2. Anti-inflammatory medications 3. Muscle relaxants 4. Membrane stabilizers 5. Opioids 6. Physical therapy and/or chiropractic manipulation 7. Modalities (Heat, ice, etc.) 8. Invasive techniques such as nerve blocks. Claire Mcdonald has attained more than 50% relief of the pain from a series of diagnostic injections conducted in separate occasions.  Pain Score: Pre-procedure:  /10 Post-procedure:  /10  Pre-op Assessment:  Claire Mcdonald is a 54 y.o. (year old), female patient, seen today for interventional treatment. She  has a past surgical history that includes Tubal ligation (1998); Ovarian cyst removal (Left); central line (03/25/2015); Hand surgery (Left); Cardiac catheterization (N/A, 03/27/2015); Cardiac catheterization (N/A, 04/11/2015); Cardiac catheterization (N/A, 07/16/2015); Colonoscopy with propofol (N/A, 10/01/2015); Esophagogastroduodenoscopy (egd) with propofol (N/A, 10/01/2015); and Abdominal hysterectomy. Claire Mcdonald has a current medication list which includes the following prescription(s): acetaminophen, albuterol, atorvastatin, azelastine, gnp calcium 1200, cetirizine, vitamin d3, famotidine, fluticasone, furosemide, gabapentin, hydroxyzine, levetiracetam, magnesium, metoprolol tartrate, promethazine, rivaroxaban, sertraline, topiramate, and triamcinolone, and the following Facility-Administered Medications: albuterol, alum & mag hydroxide-simeth, atorvastatin, azelastine, bisacodyl, calcium-vitamin d, cholecalciferol, docusate sodium, famotidine, feeding supplement (ensure enlive), fluticasone, furosemide, gabapentin, hydromorphone, hydroxyzine, lactated ringers, levetiracetam, loratadine, lorazepam, magnesium citrate, magnesium hydroxide, magnesium oxide,  menthol-cetylpyridinium **OR** phenol, methocarbamol, metoclopramide **OR** metoclopramide, metoprolol tartrate, multivitamin with minerals, oxycodone-acetaminophen, promethazine, rivaroxaban, senna-docusate, sertraline, sodium chloride flush, sodium chloride flush, topiramate, and tranexamic acid (CYKLOKAPRON) 2,000 mg in sodium chloride 0.9 % 50 mL Topical Application. Her primarily concern today is the No chief complaint on file.  Initial Vital Signs:  Pulse/HCG Rate:    Temp:   Resp:  BP:   SpO2:    BMI: Estimated body mass index is 33.11 kg/m as calculated from the following:   Height as of 02/25/20: 5\' 2"  (1.575 m).   Weight as of 02/25/20: 181 lb (82.1 kg).  Risk Assessment: Allergies: Reviewed. She is allergic to tape, iodinated diagnostic agents, toradol [ketorolac tromethamine], tramadol, and zofran [ondansetron hcl].  Allergy Precautions: None required Coagulopathies: Reviewed. None identified.  Blood-thinner therapy: None at this time Active Infection(s): Reviewed. None identified. Claire Mcdonald is afebrile  Site Confirmation: Claire Mcdonald was asked to confirm the procedure and laterality before marking the site Procedure checklist: Completed Consent: Before the procedure and under the influence of no sedative(s), amnesic(s), or anxiolytics, the patient was informed of the treatment options, risks and possible complications. To fulfill our ethical and legal obligations, as recommended by the American Medical Association's Code of Ethics, I have informed the patient of my clinical impression; the nature and purpose of the treatment or procedure; the risks, benefits, and possible complications of the intervention; the alternatives, including doing nothing; the risk(s) and benefit(s) of the alternative treatment(s) or procedure(s); and the risk(s) and benefit(s) of doing nothing. The patient was provided information about the general risks and possible complications associated with the  procedure. These may include, but are not limited to: failure to achieve desired goals, infection, bleeding, organ or nerve damage, allergic reactions, paralysis, and death. In addition, the patient was informed of those risks and complications associated to Spine-related procedures, such as failure to decrease pain; infection (i.e.: Meningitis, epidural or intraspinal abscess); bleeding (i.e.: epidural hematoma, subarachnoid hemorrhage, or any other type of intraspinal or peri-dural bleeding); organ or nerve damage (i.e.: Any type of peripheral nerve, nerve root, or spinal cord injury) with subsequent damage to sensory, motor, and/or autonomic systems, resulting in permanent pain, numbness, and/or weakness of one or several areas of the body; allergic reactions; (i.e.: anaphylactic reaction); and/or death. Furthermore, the patient was informed of those risks and complications associated with the medications. These include, but are not limited to: allergic reactions (i.e.: anaphylactic or anaphylactoid reaction(s)); adrenal axis suppression; blood sugar elevation that in diabetics may result in ketoacidosis or comma; water retention that in patients with history of congestive heart failure may result in shortness of breath, pulmonary edema, and decompensation with resultant heart failure; weight gain; swelling or edema; medication-induced neural toxicity; particulate matter embolism and blood vessel occlusion with resultant organ, and/or nervous system infarction; and/or aseptic necrosis of one or more joints. Finally, the patient was informed that Medicine is not an exact science; therefore, there is also the possibility of unforeseen or unpredictable risks and/or possible complications that may result in a catastrophic outcome. The patient indicated having understood very clearly. We have given the patient no guarantees and we have made no promises. Enough time was given to the patient to ask questions, all of  which were answered to the patient's satisfaction. Claire Mcdonald has indicated that she wanted to continue with the procedure. Attestation: I, the ordering provider, attest that I have discussed with the patient the benefits, risks, side-effects, alternatives, likelihood of achieving goals, and potential problems during recovery for the procedure that I have provided informed consent. Date  Time: {CHL [facility]-PAIN TIME CHOICES:[id number]}  Pre-Procedure Preparation:  Monitoring: As per clinic protocol. Respiration, ETCO2, SpO2, BP, heart rate and rhythm monitor placed and checked for adequate function Safety Precautions: Patient was assessed for positional comfort and pressure points before starting the procedure. Time-out: I initiated and conducted  the "Time-out" before starting the procedure, as per protocol. The patient was asked to participate by confirming the accuracy of the "Time Out" information. Verification of the correct person, site, and procedure were performed and confirmed by me, the nursing staff, and the patient. "Time-out" conducted as per Joint Commission's Universal Protocol (UP.01.01.01). Time:    Description of Procedure:          Laterality: Left Levels:  L2, L3, L4, L5, & S1 Medial Branch Level(s), at the L3-4, L4-5, and the L5-S1 lumbar facet joints. Area Prepped: Lumbosacral DuraPrep (Iodine Povacrylex [0.7% available iodine] and Isopropyl Alcohol, 74% w/w) Safety Precautions: Aspiration looking for blood return was conducted prior to all injections. At no point did we inject any substances, as a needle was being advanced. Before injecting, the patient was told to immediately notify me if she was experiencing any new onset of "ringing in the ears, or metallic taste in the mouth". No attempts were made at seeking any paresthesias. Safe injection practices and needle disposal techniques used. Medications properly checked for expiration dates. SDV (single dose vial) medications used.  After the completion of the procedure, all disposable equipment used was discarded in the proper designated medical waste containers. Local Anesthesia: Protocol guidelines were followed. The patient was positioned over the fluoroscopy table. The area was prepped in the usual manner. The time-out was completed. The target area was identified using fluoroscopy. A 12-in long, straight, sterile hemostat was used with fluoroscopic guidance to locate the targets for each level blocked. Once located, the skin was marked with an approved surgical skin marker. Once all sites were marked, the skin (epidermis, dermis, and hypodermis), as well as deeper tissues (fat, connective tissue and muscle) were infiltrated with a small amount of a short-acting local anesthetic, loaded on a 10cc syringe with a 25G, 1.5-in  Needle. An appropriate amount of time was allowed for local anesthetics to take effect before proceeding to the next step. Local Anesthetic: Lidocaine 2.0% The unused portion of the local anesthetic was discarded in the proper designated containers. Technical explanation of process:  Radiofrequency Ablation (RFA) L2 Medial Branch Nerve RFA: The target area for the L2 medial branch is at the junction of the postero-lateral aspect of the superior articular process and the superior, posterior, and medial edge of the transverse process of L3. Under fluoroscopic guidance, a Radiofrequency needle was inserted until contact was made with os over the superior postero-lateral aspect of the pedicular shadow (target area). Sensory and motor testing was conducted to properly adjust the position of the needle. Once satisfactory placement of the needle was achieved, the numbing solution was slowly injected after negative aspiration for blood. 2.0 mL of the nerve block solution was injected without difficulty or complication. After waiting for at least 3 minutes, the ablation was performed. Once completed, the needle was removed  intact. L3 Medial Branch Nerve RFA: The target area for the L3 medial branch is at the junction of the postero-lateral aspect of the superior articular process and the superior, posterior, and medial edge of the transverse process of L4. Under fluoroscopic guidance, a Radiofrequency needle was inserted until contact was made with os over the superior postero-lateral aspect of the pedicular shadow (target area). Sensory and motor testing was conducted to properly adjust the position of the needle. Once satisfactory placement of the needle was achieved, the numbing solution was slowly injected after negative aspiration for blood. 2.0 mL of the nerve block solution was injected without difficulty or complication.  After waiting for at least 3 minutes, the ablation was performed. Once completed, the needle was removed intact. L4 Medial Branch Nerve RFA: The target area for the L4 medial branch is at the junction of the postero-lateral aspect of the superior articular process and the superior, posterior, and medial edge of the transverse process of L5. Under fluoroscopic guidance, a Radiofrequency needle was inserted until contact was made with os over the superior postero-lateral aspect of the pedicular shadow (target area). Sensory and motor testing was conducted to properly adjust the position of the needle. Once satisfactory placement of the needle was achieved, the numbing solution was slowly injected after negative aspiration for blood. 2.0 mL of the nerve block solution was injected without difficulty or complication. After waiting for at least 3 minutes, the ablation was performed. Once completed, the needle was removed intact. L5 Medial Branch Nerve RFA: The target area for the L5 medial branch is at the junction of the postero-lateral aspect of the superior articular process of S1 and the superior, posterior, and medial edge of the sacral ala. Under fluoroscopic guidance, a Radiofrequency needle was inserted  until contact was made with os over the superior postero-lateral aspect of the pedicular shadow (target area). Sensory and motor testing was conducted to properly adjust the position of the needle. Once satisfactory placement of the needle was achieved, the numbing solution was slowly injected after negative aspiration for blood. 2.0 mL of the nerve block solution was injected without difficulty or complication. After waiting for at least 3 minutes, the ablation was performed. Once completed, the needle was removed intact. S1 Medial Branch Nerve RFA: The target area for the S1 medial branch is located inferior to the junction of the S1 superior articular process and the L5 inferior articular process, posterior, inferior, and lateral to the 6 o'clock position of the L5-S1 facet joint, just superior to the S1 posterior foramen. Under fluoroscopic guidance, the Radiofrequency needle was advanced until contact was made with os over the Target area. Sensory and motor testing was conducted to properly adjust the position of the needle. Once satisfactory placement of the needle was achieved, the numbing solution was slowly injected after negative aspiration for blood. 2.0 mL of the nerve block solution was injected without difficulty or complication. After waiting for at least 3 minutes, the ablation was performed. Once completed, the needle was removed intact. Radiofrequency lesioning (ablation):  Radiofrequency Generator: NeuroTherm [id number] Sensory Stimulation Parameters: 50 Hz was used to locate & identify the nerve, making sure that the needle was positioned such that there was no sensory stimulation below 0.3 V or above 0.7 V. Motor Stimulation Parameters: 2 Hz was used to evaluate the motor component. Care was taken not to lesion any nerves that demonstrated motor stimulation of the lower extremities at an output of less than 2.5 times that of the sensory threshold, or a maximum of 2.0 V. Lesioning Technique  Parameters: Standard Radiofrequency settings. (Not bipolar or pulsed.) Temperature Settings: 80 degrees C Lesioning time: 60 seconds Intra-operative Compliance: Compliant Materials & Medications: Needle(s) (Electrode/Cannula) Type: Teflon-coated, curved tip, Radiofrequency needle(s) Gauge: 22G Length: 10cm Numbing solution: 0.2% PF-Ropivacaine + Triamcinolone (40 mg/mL) diluted to a final concentration of 4 mg of Triamcinolone/mL of Ropivacaine The unused portion of the solution was discarded in the proper designated containers.  Once the entire procedure was completed, the treated area was cleaned, making sure to leave some of the prepping solution back to take advantage of its long term bactericidal properties.  Illustration of the posterior view of the lumbar spine and the posterior neural structures. Laminae of L2 through S1 are labeled. DPRL5, dorsal primary ramus of L5; DPRS1, dorsal primary ramus of S1; DPR3, dorsal primary ramus of L3; FJ, facet (zygapophyseal) joint L3-L4; I, inferior articular process of L4; LB1, lateral branch of dorsal primary ramus of L1; IAB, inferior articular branches from L3 medial branch (supplies L4-L5 facet joint); IBP, intermediate branch plexus; MB3, medial branch of dorsal primary ramus of L3; NR3, third lumbar nerve root; S, superior articular process of L5; SAB, superior articular branches from L4 (supplies L4-5 facet joint also); TP3, transverse process of L3.  There were no vitals filed for this visit. Start Time:   hrs. End Time:   hrs.  Imaging Guidance (Spinal):          Type of Imaging Technique: Fluoroscopy Guidance (Spinal) Indication(s): Assistance in needle guidance and placement for procedures requiring needle placement in or near specific anatomical locations not easily accessible without such assistance. Exposure Time: Please see nurses notes. Contrast: None used. Fluoroscopic Guidance: I was personally present during the use of  fluoroscopy. "Tunnel Vision Technique" used to obtain the best possible view of the target area. Parallax error corrected before commencing the procedure. "Direction-depth-direction" technique used to introduce the needle under continuous pulsed fluoroscopy. Once target was reached, antero-posterior, oblique, and lateral fluoroscopic projection used confirm needle placement in all planes. Images permanently stored in EMR. Interpretation: No contrast injected. I personally interpreted the imaging intraoperatively. Adequate needle placement confirmed in multiple planes. Permanent images saved into the patient's record.  Antibiotic Prophylaxis:   Anti-infectives (From admission, onward)   None     Indication(s): None identified  Post-operative Assessment:  Post-procedure Vital Signs:  Pulse/HCG Rate:    Temp:   Resp:   BP:   SpO2:    EBL: None  Complications: No immediate post-treatment complications observed by team, or reported by patient.  Note: The patient tolerated the entire procedure well. A repeat set of vitals were taken after the procedure and the patient was kept under observation following institutional policy, for this type of procedure. Post-procedural neurological assessment was performed, showing return to baseline, prior to discharge. The patient was provided with post-procedure discharge instructions, including a section on how to identify potential problems. Should any problems arise concerning this procedure, the patient was given instructions to immediately contact us, at any time, without hesitation. In any case, we plan to contact the patient by telephone for a follow-up status report regarding this interventional procedure.  Comments:  No additional relevant information.  Plan of Care  Orders:  No orders of the defined types were placed in this encounter.  Chronic Opioid Analgesic:  No opioid analgesics prescribed by our practice.   Medications ordered for  procedure: No orders of the defined types were placed in this encounter.  Medications administered: Claire Mcdonald had no medications administered during this visit.  See the medical record for exact dosing, route, and time of administration.  Follow-up plan:   No follow-ups on file.       Interventional management options: Planned, scheduled, and/or pending:       Considering:   (Prior patient of Dr. Mohammed Kindle) NOTE: Xarelto Anticoagulation. (Stop: 3 days  Restart: 6 hrs) CONTRAST ALLERGY.  Diagnosticbilateral L5 TFESI #3  (HX of not following-up after procedures) Possiblebilateral lumbar facet RFA  Possible intrathecal trial for pump.   PRN Procedures:   Palliative bilateral lumbar facet block #  3  Palliative bilateral L5 TFESI #3  Palliative right lumbar facet RFA #2 (last done 02/07/2020)  Palliative left lumbar facet RFA #2 (last done 02/28/2020)    Recent Visits Date Type Provider Dept  02/07/20 Procedure visit Milinda Pointer, MD [facility]  01/19/20 Telemedicine Milinda Pointer, MD [facility]  01/05/20 Procedure visit Milinda Pointer, MD [facility]  Showing recent visits within past 90 days and meeting all other requirements Today's Visits Date Type Provider Dept  02/28/20 Appointment Milinda Pointer, MD [facility]  Showing today's visits and meeting all other requirements Future Appointments No visits were found meeting these conditions. Showing future appointments within next 90 days and meeting all other requirements  Disposition: Discharge home  Discharge (Date  Time): 02/28/2020;   hrs.   Primary Care Physician: [facility] Location: [facility] Note by: [name], MD Date: 02/28/2020; Time: 7:24 AM  Disclaimer:  Medicine is not an Chief Strategy Officer. The only guarantee in medicine is that nothing is guaranteed. It is important to note that the decision to  proceed with this intervention was based on the information collected from the patient. The Data and conclusions were drawn from the patient's questionnaire, the interview, and the physical examination. Because the information was provided in large part by the patient, it cannot be guaranteed that it has not been purposely or unconsciously manipulated. Every effort has been made to obtain as much relevant data as possible for this evaluation. It is important to note that the conclusions that lead to this procedure are derived in large part from the available data. Always take into account that the treatment will also be dependent on availability of resources and existing treatment guidelines, considered by other Pain Management Practitioners as being common knowledge and practice, at the time of the intervention. For Medico-Legal purposes, it is also important to point out that variation in procedural techniques and pharmacological choices are the acceptable norm. The indications, contraindications, technique, and results of the above procedure should only be interpreted and judged by a Board-Certified Interventional Pain Specialist with extensive familiarity and expertise in the same exact procedure and technique.

## 2020-02-29 LAB — BASIC METABOLIC PANEL
Anion gap: 8 (ref 5–15)
BUN: 21 mg/dL — ABNORMAL HIGH (ref 6–20)
CO2: 28 mmol/L (ref 22–32)
Calcium: 8.1 mg/dL — ABNORMAL LOW (ref 8.9–10.3)
Chloride: 104 mmol/L (ref 98–111)
Creatinine, Ser: 0.74 mg/dL (ref 0.44–1.00)
GFR calc Af Amer: >60 mL/min (ref >60)
GFR calc non Af Amer: >60 mL/min (ref >60)
Glucose, Bld: 109 mg/dL — ABNORMAL HIGH (ref 70–99)
Potassium: 3.7 mmol/L (ref 3.5–5.1)
Sodium: 140 mmol/L (ref 135–145)

## 2020-02-29 LAB — CBC
HCT: 28.1 % — ABNORMAL LOW (ref 36.0–46.0)
Hemoglobin: 9.6 g/dL — ABNORMAL LOW (ref 12.0–15.0)
MCH: 30.4 pg (ref 26.0–34.0)
MCHC: 34.2 g/dL (ref 30.0–36.0)
MCV: 88.9 fL (ref 80.0–100.0)
Platelets: 262 10*3/uL (ref 150–400)
RBC: 3.16 MIL/uL — ABNORMAL LOW (ref 3.87–5.11)
RDW: 14.6 % (ref 11.5–15.5)
WBC: 6.7 10*3/uL (ref 4.0–10.5)
nRBC: 0 % (ref 0.0–0.2)

## 2020-02-29 LAB — SURGICAL PATHOLOGY

## 2020-02-29 MED ORDER — MUSCLE RUB 10-15 % EX CREA
TOPICAL_CREAM | CUTANEOUS | Status: DC | PRN
  Filled 2020-02-29: qty 85

## 2020-02-29 MED ORDER — HYDROMORPHONE HCL 1 MG/ML IJ SOLN: 0.5000 mg | INTRAMUSCULAR | Status: DC | PRN

## 2020-02-29 NOTE — Progress Notes (Signed)
  Subjective:  Patient reports pain as moderate.  Objective:   VITALS:   Vitals:   02/29/20 0800 02/29/20 0804 02/29/20 1423 02/29/20 1506  BP: (!) 92/48 102/70 98/68 116/73  Pulse: 79  86 90  Resp: 16  20 17   Temp: 98.4 F (36.9 C)  97.9 F (36.6 C) 98 F (36.7 C)  TempSrc: Oral  Oral Oral  SpO2: 97%  100% 100%  Weight:      Height:        PHYSICAL EXAM:  Sensation intact distally Dorsiflexion/Plantar flexion intact Incision: dressing C/D/I No cellulitis present Compartment soft  LABS  Results for orders placed or performed during the hospital encounter of 02/25/20 (from the past 24 hour(s))  CBC     Status: Abnormal   Collection Time: 02/29/20  7:00 AM  Result Value Ref Range   WBC 6.7 4.0 - 10.5 K/uL   RBC 3.16 (L) 3.87 - 5.11 MIL/uL   Hemoglobin 9.6 (L) 12.0 - 15.0 g/dL   HCT 28.1 (L) 36 - 46 %   MCV 88.9 80.0 - 100.0 fL   MCH 30.4 26.0 - 34.0 pg   MCHC 34.2 30.0 - 36.0 g/dL   RDW 14.6 11.5 - 15.5 %   Platelets 262 150 - 400 K/uL   nRBC 0.0 0.0 - 0.2 %  Basic metabolic panel     Status: Abnormal   Collection Time: 02/29/20  7:00 AM  Result Value Ref Range   Sodium 140 135 - 145 mmol/L   Potassium 3.7 3.5 - 5.1 mmol/L   Chloride 104 98 - 111 mmol/L   CO2 28 22 - 32 mmol/L   Glucose, Bld 109 (H) 70 - 99 mg/dL   BUN 21 (H) 6 - 20 mg/dL   Creatinine, Ser 0.74 0.44 - 1.00 mg/dL   Calcium 8.1 (L) 8.9 - 10.3 mg/dL   GFR calc non Af Amer >60 >60 mL/min   GFR calc Af Amer >60 >60 mL/min   Anion gap 8 5 - 15    No results found.  Assessment/Plan: 2 Days Post-Op   Principal Problem:   Fracture of femoral neck, right, closed ([place]) Active Problems:   Essential hypertension   Hyperlipidemia   Deep vein thrombosis (DVT) (HCC)   CAD (coronary artery disease)   Anxiety   Asthma   Gastroesophageal reflux disease without esophagitis   Hypokalemia   Seizure ([place])   Fall   History of CVA (cerebrovascular accident)   Abdominal mass   Up with  therapy Plan for discharge tomorrow if medically stable, OK for outpatient PT from ortho standpoint RTC 2 weeks for staple removal   Lovell Sheehan , MD 02/29/2020, 3:32 PM

## 2020-02-29 NOTE — Progress Notes (Signed)
Patient alert and oriented x4. Patient educated on the importance of getting up to [facility] instead of using the [place]. Patient ambulated to bedside commode. Tolerated well.

## 2020-02-29 NOTE — Progress Notes (Signed)
Physical Therapy Treatment Patient Details Name: Claire Mcdonald MRN: [id number] DOB: 1965-09-27 Today's Date: 02/29/2020    History of Present Illness Ms. Yazdi is a 54 y/o female that is POD 2 of R anterior hip hemiarthroplasty after sustaining a mechanical fall and subsequent R subcapital fracture. 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    PT Comments    Pt in bed upon arrival to room and agreed to PT session. Pt continues to require assistance during bed mobility for RLE management due to pain and weakness. Pt performed therex in bed for promotion of soft tissue flexibility, joint integrity, and muscle strengthening. Pt ambulated 100 using RW with heavy BUE usage requiring CGA for safety and steadying. Pt able to ascend 4 steps using single HR on R and min-mod+2 A for balance. Pt became fearful and provided increased encouragement therefore unsafe to attempt to descend. Clinician recommended pt to scoot down steps on her bottom with max+2 A for safety. Pt required max+3 A to come into standing from bottom step, for bilateral knee blocking, boosting hips and balance to come into full upright standing position. Pt returned to room in wheelchair. Recommend ramp access if possible. Pt provided paperwork and RN notified to assist with completion to see if pt qualifies. Will continue to follow and progress pt as tolerated.  Follow Up Recommendations  Home health PT;Supervision/Assistance - 24 hour     Equipment Recommendations  3in1 (PT)    Recommendations for Other Services       Precautions / Restrictions Precautions Precautions: Anterior Hip;Fall Restrictions Weight Bearing Restrictions: Yes RLE Weight Bearing: Weight bearing as tolerated    Mobility  Bed Mobility Overal bed mobility: Needs Assistance Bed  Mobility: Supine to Sit     Supine to sit: Mod assist;HOB elevated     General bed mobility comments: mod A for RLE management to and over EOB with min A for trunk elevation/balance; pt required increased time secondary to reports of back and RLE pain  Transfers Overall transfer level: Needs assistance Equipment used: Rolling walker (2 wheeled) Transfers: Sit to/from Stand Sit to Stand: Mod assist         General transfer comment: mod A for boosting hips to stand and for balance; RW utilized and verbal cues for hand and foot placement for improved efficiency   Ambulation/Gait Ambulation/Gait assistance: Min guard Gait Distance (Feet): 100 Feet Assistive device: Rolling walker (2 wheeled) Gait Pattern/deviations: Step-through pattern;Trunk flexed Gait velocity: 10' in 20"   General Gait Details: ambulated 100 feet using RW with CGA; noted heavy BUE usage on RW; verbal cues for trunk extension to improve upright posture   Stairs Stairs: Yes Stairs assistance: Max assist (+3) Stair Management: One rail Right;Step to pattern;Forwards;Seated/boosting Number of Stairs: 4 General stair comments: Pt able to ascend 4 steps using single R handrail with step to pattern with min-mod+2 A for balance; required increased time and max verbal cues for sequencing; at the top of the stairs, pt became anxious and fearful despite assistance, verbal cues for sequencing and R knee blocking; clinician requested pt to sit down and pt assisted with max+2 and descended 2 steps scooting on her bottom; max+3 A to come into standing from bottom step and pt transferred to wheelchair   Wheelchair Mobility    Modified Rankin (Stroke Patients Only)  Balance Overall balance assessment: Needs assistance Sitting-balance support: Feet supported Sitting balance-Leahy Scale: Good Sitting balance - Comments: no LOB noted with dynamic BUE movements EOB   Standing balance support: Bilateral upper  extremity supported Standing balance-Leahy Scale: Good Standing balance comment: no LOB noted with BUE support on RW and CGA                            Cognition Arousal/Alertness: Awake/alert Behavior During Therapy: WFL for tasks assessed/performed Overall Cognitive Status: Difficult to assess                                 General Comments: Pt noted to change answers so unclear as to accuracy of statements. Pt requires increased verbal cues for all activities and pt demonstrates little to no retention or carryover.      Exercises Other Exercises Other Exercises: pt performed active RLE heel slides and AP x 15    General Comments        Pertinent Vitals/Pain Pain Assessment: 0-10 Pain Score: 9  Pain Location: R hip (pt also complaining of R knee and back pain this am) Pain Descriptors / Indicators: Aching;Discomfort Pain Intervention(s): Monitored during session;Limited activity within patient's tolerance    Home Living                      Prior Function            PT Goals (current goals can now be found in the care plan section) Acute Rehab PT Goals Patient Stated Goal: to not have pain PT Goal Formulation: With patient Time For Goal Achievement: 03/13/20 Potential to Achieve Goals: Good Progress towards PT goals: Progressing toward goals    Frequency    BID      PT Plan Discharge plan needs to be updated    Co-evaluation              AM-PAC PT "6 Clicks" Mobility   Outcome Measure  Help needed turning from your back to your side while in a flat bed without using bedrails?: A Little Help needed moving from lying on your back to sitting on the side of a flat bed without using bedrails?: A Lot Help needed moving to and from a bed to a chair (including a wheelchair)?: A Little Help needed standing up from a chair using your arms (e.g., wheelchair or bedside chair)?: A Little Help needed to walk in hospital room?:  A Little Help needed climbing 3-5 steps with a railing? : Total 6 Click Score: 15    End of Session Equipment Utilized During Treatment: Gait belt Activity Tolerance: Patient limited by fatigue;Patient limited by pain Patient left: in chair;with call bell/phone within reach;with chair alarm set;with SCD's reapplied Nurse Communication: Mobility status PT Visit Diagnosis: Unsteadiness on feet (R26.81);Muscle weakness (generalized) (M62.81);History of falling (Z91.81);Pain Pain - Right/Left: Right Pain - part of body: Hip     Time: 7078-6754 PT Time Calculation (min) (ACUTE ONLY): 88 min  Charges:  $Gait Training: 38-52 mins $Therapeutic Exercise: 23-37 mins                     Vale Haven, SPT   Joella Saefong 02/29/2020, 1:24 PM

## 2020-02-29 NOTE — Plan of Care (Signed)
Patient alert and oriented throughout shift. Medicated for muscle spasms and pain x1. BP running soft and MD aware. Patient requesting to have purewick placed. Patient educated about the importance of getting up to bedside commode and/bathroom to help prevent DVTs and pneumonia. This was discussed with Dr. Harlow Mares and he agreed that patient needs to get out of bed and ambulated to bedside or bathroom.

## 2020-02-29 NOTE — Progress Notes (Signed)
Physical Therapy Treatment Patient Details Name: Claire Mcdonald MRN: [id number] DOB: 08/20/1966 Today's Date: 02/29/2020    History of Present Illness Claire Mcdonald is a 54 y/o female that is POD 2 of R anterior hip hemiarthroplasty after sustaining a mechanical fall and subsequent R subcapital fracture. 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    PT Comments    Pt lying in bed upon arrival to room and immediately reported feeling a lot of pain and fatigue. Deferred OOB activities at this time. Pt performed therex with increased repetitions while in bed to promote LE strengthening and soft tissue mobility. Pt required active assistance secondary to pain and fatigue. Pt left with call bell and phone within reach, bed alarm set, and needs met at clinician's departure. Will continue to follow and progress pt to tolerance.    Follow Up Recommendations  Home health PT;Supervision/Assistance - 24 hour     Equipment Recommendations  Wheelchair (measurements PT);Other (comment) (ramp)    Recommendations for Other Services       Precautions / Restrictions Precautions Precautions: Anterior Hip;Fall Restrictions Weight Bearing Restrictions: Yes RLE Weight Bearing: Weight bearing as tolerated    Mobility  Bed Mobility Overal bed mobility: Needs Assistance Bed Mobility: Supine to Sit     Supine to sit: Mod assist;HOB elevated     General bed mobility comments: not performed as pt already positioned in bed  Transfers Overall transfer level: Needs assistance Equipment used: Rolling walker (2 wheeled) Transfers: Sit to/from Stand Sit to Stand: Mod assist         General transfer comment: deferred due to increased pain and fatigue from earlier session  Ambulation/Gait Ambulation/Gait assistance: Min  guard Gait Distance (Feet): 100 Feet Assistive device: Rolling walker (2 wheeled) Gait Pattern/deviations: Step-through pattern;Trunk flexed Gait velocity: 10' in 20"   General Gait Details: deferred due to increased pain and fatigue from earlier session   Stairs Stairs: Yes Stairs assistance: Max assist (+3) Stair Management: One rail Right;Step to pattern;Forwards;Seated/boosting Number of Stairs: 4 General stair comments: Pt able to ascend 4 steps using single R handrail with step to pattern with min-mod+2 A for balance; required increased time and max verbal cues for sequencing; at the top of the stairs, pt became anxious and fearful despite assistance, verbal cues for sequencing and R knee blocking; clinician requested pt to sit down and pt assisted with max+2 and descended 2 steps scooting on her bottom; max+3 A to come into standing from bottom step and pt transferred to wheelchair   Wheelchair Mobility    Modified Rankin (Stroke Patients Only)       Balance Overall balance assessment: Needs assistance Sitting-balance support: Feet supported Sitting balance-Leahy Scale: Good Sitting balance - Comments: not performed   Standing balance support: Bilateral upper extremity supported Standing balance-Leahy Scale: Good Standing balance comment: not performed                            Cognition Arousal/Alertness: Awake/alert Behavior During Therapy: WFL for tasks assessed/performed Overall Cognitive Status: Within Functional Limits for tasks assessed                                 General Comments: Pt noted to change answers so unclear  as to accuracy of statements. Pt requires increased verbal cues for all activities and pt demonstrates little to no retention or carryover.      Exercises Other Exercises Other Exercises: supine with HOB elevated: QS, AAROM hip ab/add, and AAROM SLR x 15; increased verbal cues for continued participation and for  initiation of movement    General Comments        Pertinent Vitals/Pain Pain Assessment: 0-10 Pain Score: 9  Pain Location: R hip Pain Descriptors / Indicators: Aching;Discomfort Pain Intervention(s): Limited activity within patient's tolerance;Monitored during session    Home Living                      Prior Function            PT Goals (current goals can now be found in the care plan section) Acute Rehab PT Goals Patient Stated Goal: to not have pain PT Goal Formulation: With patient Time For Goal Achievement: 03/13/20 Potential to Achieve Goals: Good Progress towards PT goals: Progressing toward goals    Frequency    BID      PT Plan Discharge plan needs to be updated    Co-evaluation              AM-PAC PT "6 Clicks" Mobility   Outcome Measure  Help needed turning from your back to your side while in a flat bed without using bedrails?: A Little Help needed moving from lying on your back to sitting on the side of a flat bed without using bedrails?: A Lot Help needed moving to and from a bed to a chair (including a wheelchair)?: A Little Help needed standing up from a chair using your arms (e.g., wheelchair or bedside chair)?: A Little Help needed to walk in hospital room?: A Little Help needed climbing 3-5 steps with a railing? : Total 6 Click Score: 15    End of Session Equipment Utilized During Treatment: Gait belt Activity Tolerance: Patient limited by pain;Patient limited by fatigue Patient left: in bed;with call bell/phone within reach;with bed alarm set;with SCD's reapplied Nurse Communication: Mobility status PT Visit Diagnosis: Unsteadiness on feet (R26.81);Muscle weakness (generalized) (M62.81);History of falling (Z91.81);Pain Pain - Right/Left: Right Pain - part of body: Hip     Time: 0932-6712 PT Time Calculation (min) (ACUTE ONLY): 13 min  Charges:  $Gait Training: 38-52 mins $Therapeutic Exercise: 23-37 mins                      Claire Mcdonald, SPT   Claire Mcdonald 02/29/2020, 4:17 PM

## 2020-02-29 NOTE — Progress Notes (Signed)
PROGRESS NOTE   Claire Mcdonald  DZH:[id number]    DOB: 05-Jun-1966    DOA: 02/25/2020  PCP: [facility] [place]    Chief Complaint  Patient presents with  . 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 tach, chronic CHF, DVT on Xarelto, nonobstructive CAD, diverticulosis, pancreatitis and obesity sustained a mechanical fall on day of admission, was unable to get up and presented to ED with right hip pain.  Admitted for right femoral neck fracture, underwent right hip anterior hip hemiarthroplasty on 7/5 after preop cardiac clearance.   Assessment & Plan:   Right closed femoral neck fracture: Sustained status post mechanical fall at home.  Orthopedic consulted, after preop cardiac clearance underwent right hip anterior hip arthroplasty on 7/5.   Anticoagulation with Xarelto has been resumed.   Seen by PT and OT.  It looks like she will not need to go to skilled nursing facility for rehab.  Home health is recommended however I was told by case manager that this may be difficult to do because patient only has Medicaid.  May have to do outpatient therapy.  They will discussed with patient.    S/p mechanical fall: At baseline ambulates with the help of a walker or a cane.  Denies history of frequent falls.  CT head, neck and L-spine without acute findings.    Acute blood loss anemia: Hemoglobin is likely postsurgical.  Stable this morning.  No evidence for any other overt blood loss.  Transfuse if it drops below 7.    Essential hypertension: Blood pressure noted to be borderline low.  Her antihypertensives including metoprolol and furosemide were held.  Low blood pressure likely due to narcotic  agents.  Cut back on Dilaudid.  Hyperlipidemia: Continue atorvastatin.  History of lower extremity DVT: Reports during CVA a couple of years ago.  Has been on Xarelto since.  Xarelto was resumed postoperatively.  Nonobstructive CAD, history of idiopathic cardiac arrest, V. tach, s/p ICD, chronic diastolic CHF: Reportedly follows with Dr. Sherre Lain, Cardiologist in [place].  Gives history of chronic DOE (unchanged) and what appears to be stable angina on exertion with infrequent use of sublingual NTG.  Appears stable and CHF clinically compensated. Continue statins.  Resume beta-blocker when blood pressure stabilizes.  Anxiety and depression: Stable without suicidal or homicidal ideations.  Continue Zoloft.  Asthma: Stable without clinical bronchospasm.  GERD without esophagitis: Pepcid.  Hypokalemia: Replaced.  Magnesium normal.  Seizure disorder: Seizure precautions.  Continue Keppra and Topamax.  History of CVA with residual right-sided weakness  Incidental LLQ abnormality on CT abdomen: See below.  Close outpatient follow-up with PCP regarding further evaluation and management.  "5.2 x 3.4 x 4.8 centimeters fluid attenuation structure in the LEFT LOWER QUADRANT, possibly representing a seroma or lymphocele. Less likely the findings could be related to ovarian mass. Recommend further evaluation with elective CT of the abdomen and pelvis with intravenous contrast. 6. 1.5 centimeter low-attenuation mass or lymph node in the LEFT external iliac chain."  Body mass index is 33.11 kg/m./Obesity   DVT prophylaxis: Xarelto  Code Status: Full Family Communication: None at bedside Disposition: Status is: Inpatient  Remains inpatient appropriate because:Ongoing active pain requiring inpatient pain management   Dispo:  Patient From: Home  Planned Disposition: Home  Expected discharge date: 03/01/20  Medically stable for discharge: No        Consultants:    Orthopedics Cardiology  Procedures:   Right Hip Anterior Hip Hemiarthroplasty on 7/5  Antimicrobials:   None   Subjective:  Patient states that her pain is well controlled.  She is requesting a topical agent for her knee and low back pain as well.  Denies any shortness of breath or chest pain.  She seems to be doing well with physical therapy.  Objective:   Vitals:   02/28/20 1606 02/29/20 0034 02/29/20 0800 02/29/20 0804  BP: (!) 101/56 95/71 (!) 92/48 102/70  Pulse: 80 87 79   Resp: 15 18 16    Temp: (!) 97.5 F (36.4 C) (!) 97.3 F (36.3 C) 98.4 F (36.9 C)   TempSrc: Oral Oral Oral   SpO2: 100% 99% 97%   Weight:      Height:        General appearance: Awake alert.  In no distress Resp: Clear to auscultation bilaterally.  Normal effort Cardio: S1-S2 is normal regular.  No S3-S4.  No rubs murmurs or bruit GI: Abdomen is soft.  Nontender nondistended.  Bowel sounds are present normal.  No masses organomegaly Extremities: No edema.  No bruising noted on the right hip area. Neurologic: Alert and oriented x3.  No focal neurological deficits.       Data Reviewed:   I have personally reviewed following labs and imaging studies   CBC: Recent Labs  Lab 02/25/20 1034 02/26/20 0407 02/27/20 1542 02/28/20 0405 02/29/20 0700  WBC 8.2   < > 9.6 8.6 6.7  NEUTROABS 6.3  --   --   --   --   HGB 14.5   < > 11.9* 9.6* 9.6*  HCT 44.6   < > 34.3* 29.2* 28.1*  MCV 90.3   < > 86.0 90.1 88.9  PLT 344   < > 281 283 262   < > = values in this interval not displayed.    Basic Metabolic Panel: Recent Labs  Lab 02/27/20 1542 02/28/20 0405 02/29/20 0700  NA 140 140 140  K 4.4 3.7 3.7  CL 107 106 104  CO2 21* 25 28  GLUCOSE 165* 133* 109*  BUN 17 15 21*  CREATININE 0.85 0.64 0.74  CALCIUM 8.4* 8.1* 8.1*  MG 2.3  --   --     Liver Function Tests: Recent Labs  Lab 02/25/20 1034  AST 28  ALT 29  ALKPHOS 125  BILITOT 0.8  PROT 7.6  ALBUMIN 3.9      Microbiology Studies:   Recent Results (from the past 240 hour(s))  SARS Coronavirus 2 by RT PCR (hospital order, performed in [facility] hospital lab) Nasopharyngeal Nasopharyngeal Swab     Status: None   Collection Time: 02/25/20 12:49 PM   Specimen: Nasopharyngeal Swab  Result Value Ref Range Status   SARS Coronavirus 2 NEGATIVE NEGATIVE Final    Comment: (NOTE) SARS-CoV-2 target nucleic acids are NOT DETECTED.  The SARS-CoV-2 RNA is generally detectable in upper and lower respiratory specimens during the acute phase of infection. The lowest concentration of SARS-CoV-2 viral copies this assay can detect is 250 copies / mL. A negative result does not preclude SARS-CoV-2 infection and should not be used as the sole basis for treatment or other patient management decisions.  A negative result may occur with improper specimen collection / handling, submission of  specimen other than nasopharyngeal swab, presence of viral mutation(s) within the areas targeted by this assay, and inadequate number of viral copies (<250 copies / mL). A negative result must be combined with clinical observations, patient history, and epidemiological information.  Fact Sheet for Patients:   StrictlyIdeas.no  Fact Sheet for Healthcare Providers: BankingDealers.co.za  This test is not yet approved or  cleared by the [place] FDA and has been authorized for detection and/or diagnosis of SARS-CoV-2 by FDA under an Emergency Use Authorization (EUA).  This EUA will remain in effect (meaning this test can be used) for the duration of the COVID-19 declaration under Section 564(b)(1) of the Act, 21 U.S.C. section 360bbb-3(b)(1), unless the authorization is terminated or revoked sooner.  Performed at [facility], [address] [place], [place] [address]   MRSA PCR Screening     Status: None   Collection Time: 02/26/20  5:16 PM    Specimen: Nasal Mucosa; Nasopharyngeal  Result Value Ref Range Status   MRSA by PCR NEGATIVE NEGATIVE Final    Comment:        The GeneXpert MRSA Assay (FDA approved for NASAL specimens only), is one component of a comprehensive MRSA colonization surveillance program. It is not intended to diagnose MRSA infection nor to guide or monitor treatment for MRSA infections. Performed at [facility], [address] [place], [place] [address]      Radiology Studies:  No results found.   Scheduled Meds:   . atorvastatin  40 mg Oral q1800  . calcium-vitamin D  2 tablet Oral Q breakfast  . cholecalciferol  5,000 Units Oral Daily  . docusate sodium  100 mg Oral BID  . famotidine  20 mg Oral BID  . feeding supplement (ENSURE ENLIVE)  237 mL Oral BID BM  . fluticasone  2 puff Inhalation BID  . gabapentin  300 mg Oral TID  . levETIRAcetam  500 mg Oral BID  . loratadine  10 mg Oral Daily  . magnesium oxide  400 mg Oral Daily  . multivitamin with minerals  1 tablet Oral Daily  . rivaroxaban  20 mg Oral Q supper  . sertraline  100 mg Oral Daily  . sodium chloride flush  10-40 mL Intracatheter Q12H  . topiramate  100 mg Oral BID  . tranexamic acid (CYKLOKAPRON) topical - INTRAOP  2,000 mg Topical 30 min Pre-Op    Continuous Infusions:      LOS: 4 days     Bonnielee Haff,  [facility]    To contact the attending provider between 7A-7P or the covering provider during after hours 7P-7A, please log into the web site [email or web address] and access using universal [place] password for that web site. If you do not have the password, please call the hospital operator.  02/29/2020, 12:22 PM

## 2020-03-01 LAB — CBC
HCT: 30 % — ABNORMAL LOW (ref 36.0–46.0)
Hemoglobin: 10.1 g/dL — ABNORMAL LOW (ref 12.0–15.0)
MCH: 29.7 pg (ref 26.0–34.0)
MCHC: 33.7 g/dL (ref 30.0–36.0)
MCV: 88.2 fL (ref 80.0–100.0)
Platelets: 271 10*3/uL (ref 150–400)
RBC: 3.4 MIL/uL — ABNORMAL LOW (ref 3.87–5.11)
RDW: 14.6 % (ref 11.5–15.5)
WBC: 6.7 10*3/uL (ref 4.0–10.5)
nRBC: 0 % (ref 0.0–0.2)

## 2020-03-01 LAB — BASIC METABOLIC PANEL
Anion gap: 8 (ref 5–15)
BUN: 15 mg/dL (ref 6–20)
CO2: 25 mmol/L (ref 22–32)
Calcium: 8.2 mg/dL — ABNORMAL LOW (ref 8.9–10.3)
Chloride: 107 mmol/L (ref 98–111)
Creatinine, Ser: 0.68 mg/dL (ref 0.44–1.00)
GFR calc Af Amer: >60 mL/min (ref >60)
GFR calc non Af Amer: >60 mL/min (ref >60)
Glucose, Bld: 105 mg/dL — ABNORMAL HIGH (ref 70–99)
Potassium: 3.7 mmol/L (ref 3.5–5.1)
Sodium: 140 mmol/L (ref 135–145)

## 2020-03-01 MED ORDER — SENNOSIDES-DOCUSATE SODIUM 8.6-50 MG PO TABS: 1.0000 | ORAL_TABLET | Freq: Two times a day (BID) | ORAL | 0 refills | Status: AC

## 2020-03-01 MED ORDER — POLYETHYLENE GLYCOL 3350 17 G PO PACK: 17.0000 g | PACK | Freq: Every day | ORAL | 0 refills | Status: AC

## 2020-03-01 MED ORDER — METHOCARBAMOL 500 MG PO TABS: 500.0000 mg | ORAL_TABLET | Freq: Three times a day (TID) | ORAL | 0 refills | Status: DC | PRN

## 2020-03-01 MED ORDER — OXYCODONE-ACETAMINOPHEN 5-325 MG PO TABS: 1.0000 | ORAL_TABLET | Freq: Four times a day (QID) | ORAL | 0 refills | Status: AC | PRN

## 2020-03-01 NOTE — Progress Notes (Signed)
Physical Therapy Treatment Patient Details Name: Claire Mcdonald MRN: [id number] DOB: 1966-08-22 Today's Date: 03/01/2020    History of Present Illness Ms. Eyster is a 54 y/o female that is POD 3 of R anterior hip hemiarthroplasty after sustaining a mechanical fall and subsequent R subcapital fracture. 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    PT Comments    Pt in bed upon arrival to room, pleasant, and agreeable to PT. Pt performed therex in bed for promotion of RLE muscle strengthening for progression towards improved functional mobility. Pt continues to require mod A for RLE secondary to pain. Pt ambulated 250 feet using RW initially with CGA then progressed to SBA with occasional CGA due to pt reports of her leg wanting to give out. Pt with slightly increased gait speed of 10 feet in 19 seconds. Pt able to ascend steps with assistance at yesterday's session. Pt was educated that her boyfriend would have to assist to get her upstairs to get in her home after discharge. Stair negotiation technique written on pt's education packet for reminder of safe technique. If pt and boyfriend are unsure of this, EMS may need to be requested to help with this. Pt good to discharge from PT standpoint and resume PT services with HHPT.   Follow Up Recommendations  Home health PT;Supervision/Assistance - 24 hour     Equipment Recommendations  Wheelchair (measurements PT);Other (comment)    Recommendations for Other Services       Precautions / Restrictions Precautions Precautions: Anterior Hip;Fall Restrictions Weight Bearing Restrictions: Yes RLE Weight Bearing: Weight bearing as tolerated    Mobility  Bed Mobility Overal bed mobility: Needs Assistance Bed Mobility: Supine to Sit     Supine to sit: Mod assist;HOB  elevated     General bed mobility comments: Mod A for RLE management to and over edge of bed  Transfers Overall transfer level: Needs assistance Equipment used: Rolling walker (2 wheeled) Transfers: Sit to/from Stand Sit to Stand: Min guard         General transfer comment: CGA for sit to stand from bed and BSC for steadying due to pt reports of her R leg wanting to give out  Ambulation/Gait Ambulation/Gait assistance: Min guard;Supervision Gait Distance (Feet): 250 Feet Assistive device: Rolling walker (2 wheeled) Gait Pattern/deviations: Step-through pattern Gait velocity: 10' in 19"   General Gait Details: reciprocal pattern present with even step lengths bilaterally   Stairs             Wheelchair Mobility    Modified Rankin (Stroke Patients Only)       Balance Overall balance assessment: Needs assistance Sitting-balance support: Feet supported Sitting balance-Leahy Scale: Good Sitting balance - Comments: no LOB noted   Standing balance support: Bilateral upper extremity supported Standing balance-Leahy Scale: Good Standing balance comment: pt able to maintain with CGA and progressed to SBA with BUE on RW                            Cognition Arousal/Alertness: Awake/alert Behavior During Therapy: [facility] for tasks assessed/performed Overall Cognitive Status: Within Functional Limits for tasks assessed  Exercises Other Exercises Other Exercises: pt in semi supine position: AAROM heel slides, hip ab/add with mod A secondary to pain    General Comments        Pertinent Vitals/Pain Pain Assessment: 0-10 Pain Score: 9  Pain Location: R hip Pain Descriptors / Indicators: Aching;Discomfort;Grimacing Pain Intervention(s): Monitored during session;Ice applied    Home Living                      Prior Function            PT Goals (current goals can now be found in the care  plan section) Acute Rehab PT Goals Patient Stated Goal: to not have pain PT Goal Formulation: With patient Time For Goal Achievement: 03/13/20 Potential to Achieve Goals: Good Progress towards PT goals: Progressing toward goals    Frequency    BID      PT Plan Current plan remains appropriate    Co-evaluation              AM-PAC PT "6 Clicks" Mobility   Outcome Measure  Help needed turning from your back to your side while in a flat bed without using bedrails?: A Little Help needed moving from lying on your back to sitting on the side of a flat bed without using bedrails?: A Lot Help needed moving to and from a bed to a chair (including a wheelchair)?: A Little Help needed standing up from a chair using your arms (e.g., wheelchair or bedside chair)?: A Little Help needed to walk in hospital room?: A Little Help needed climbing 3-5 steps with a railing? : A Lot 6 Click Score: 16    End of Session Equipment Utilized During Treatment: Gait belt Activity Tolerance: Patient limited by pain;Patient limited by fatigue Patient left: in chair;with call bell/phone within reach;with chair alarm set Nurse Communication: Mobility status PT Visit Diagnosis: Unsteadiness on feet (R26.81);Muscle weakness (generalized) (M62.81);History of falling (Z91.81);Pain Pain - Right/Left: Right Pain - part of body: Hip     Time: 1696-7893 PT Time Calculation (min) (ACUTE ONLY): 43 min  Charges:                        Vale Haven, SPT   Vale Haven 03/01/2020, 11:19 AM

## 2020-03-01 NOTE — TOC Transition Note (Signed)
Transition of Care [facility]) - CM/SW Discharge Note   Patient Details  Name: Claire Mcdonald MRN: [id number] Date of Birth: 1966-02-15  Transition of Care [facility]) CM/SW Contact:  Su Hilt, RN Phone Number: 03/01/2020, 1:23 PM   Clinical Narrative:     Spoke with the patient about DC plan and needs, she has 8 steps to get into her home and needs EMS transport home, she has a rolator at home but would need a RW and a 3 in 1 I notified Zack with [employer] She is getting a ramp built on 7/17 She is to go to outpatient PT, I faxed the referral to [facility] outpatient and explained if she has not heard form them by tomorrow to call them, she stated understanding,  Her husband will transport the DME home and she will go in an Ambulance [facility], TOC will call for transport once the DME is delivered Final next level of care: Home/Self Care Barriers to Discharge: Barriers Resolved   Patient Goals and CMS Choice Patient states their goals for this hospitalization and ongoing recovery are:: go home      Discharge Placement                       Discharge Plan and Services   Discharge Planning Services: CM Consult            DME Arranged: 3-N-1, Walker rolling DME Agency: [employer] Date DME Agency Contacted: 03/01/20 Time DME Agency Contacted: 63 Representative spoke with at DME Agency: [place]: NA          Social Determinants of Health ([place]) Interventions     Readmission Risk Interventions No flowsheet data found.

## 2020-03-01 NOTE — Discharge Summary (Signed)
[facility]  Physician Discharge Summary   Patient ID: Claire Mcdonald MRN: [id number] DOB/AGE: 11-05-1965 54 y.o.  Admit date: 02/25/2020 Discharge date: 03/01/2020  PCP: [facility]icians, [place]:  Right femoral neck fracture status post right hip anterior arthroplasty Acute blood loss anemia Essential hypertension Hyperlipidemia History of lower extremity DVT Nonobstructive CAD Chronic diastolic CHF History of idiopathic cardiac arrest/V. tach status post ICD History of anxiety and depression History of asthma History of GERD History of seizure disorder History of CVA  RECOMMENDATIONS FOR OUTPATIENT FOLLOW UP: 1. Incidental left lower quadrant abnormality noted on CT scan.  See below.  Will need outpatient management by PCP. 2. Follow-up with orthopedics.   Home Health: None.  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 tach, chronic CHF, DVT on Xarelto, nonobstructive CAD, diverticulosis, pancreatitis and obesity sustained a mechanical fall on day of admission, was unable to get up and presented to ED with right hip pain.  Admitted for right femoral neck fracture, underwent right hip anterior hip hemiarthroplasty on 7/5 after preop cardiac clearance.   Consultations: Orthopedics Cardiology  Procedures: RightHip Anterior Hip Hemiarthroplasty on 7/5   HOSPITAL COURSE:   Right closed femoral neck fracture: Sustained status post mechanical fall at home.  Orthopedic consulted, after preop cardiac clearance underwent right  hip anterior hip arthroplasty on 7/5.   Anticoagulation with Xarelto has been resumed.   Seen by PT and OT.  It looks like she will not need to go to skilled nursing facility for rehab.  Home health is recommended however was told by case manager that this may be difficult to do because patient only has Medicaid.   Arrange outpatient physical therapy. Cleared by orthopedics.  Outpatient follow-up in 2 weeks.  S/p mechanical fall: At baseline ambulates with the help of a walker or a cane.  Denies history of frequent falls.  CT head, neck and L-spine without acute findings.    Acute blood loss anemia: Hemoglobin is likely postsurgical.  Stable this morning.  No evidence for any other overt blood loss.  Transfuse if it drops below 7.    Essential hypertension: Blood pressure noted to be borderline low likely due to narcotics and anesthesia.  Improved subsequently.  May resume her home medications.  Hyperlipidemia: Continue atorvastatin.  History of lower extremity DVT: Reports during CVA a couple of years ago.  Has been on Xarelto since.  Xarelto was resumed postoperatively.  Nonobstructive CAD, history of idiopathic cardiac arrest, V. tach, s/p ICD, chronic diastolic CHF: Reportedly follows with Dr. Sherre Lain, Cardiologist in [place].  Gives history of chronic DOE (unchanged) and what appears to be stable angina on exertion with infrequent use of sublingual NTG.    Stable for the most part.  Anxiety and depression: Stable without suicidal or homicidal ideations.  Continue Zoloft.  Asthma: Stable without clinical bronchospasm.  GERD without esophagitis: Pepcid.  Hypokalemia: Replaced.  Magnesium normal.  Seizure disorder: Seizure precautions.  Continue Keppra and Topamax.  History of CVA with residual right-sided weakness  Incidental LLQ abnormality on CT abdomen: See below.  Close outpatient follow-up with PCP regarding further evaluation and management.  "  5.2 x 3.4 x  4.8 centimeters fluid attenuation structure in the LEFT LOWER QUADRANT, possibly representing a seroma or lymphocele. Less likely the findings could be related to ovarian mass. Recommend further evaluation with elective CT of the abdomen and pelvis with intravenous contrast. 6. 1.5 centimeter low-attenuation mass or lymph node in the LEFT external iliac chain."  Body mass index is 33.11 kg/m./Obesity  Overall stable.  Okay for discharge home today.   PERTINENT LABS:  The results of significant diagnostics from this hospitalization (including imaging, microbiology, ancillary and laboratory) are listed below for reference.    Microbiology: Recent Results (from the past 240 hour(s))  SARS Coronavirus 2 by RT PCR (hospital order, performed in [facility] hospital lab) Nasopharyngeal Nasopharyngeal Swab     Status: None   Collection Time: 02/25/20 12:49 PM   Specimen: Nasopharyngeal Swab  Result Value Ref Range Status   SARS Coronavirus 2 NEGATIVE NEGATIVE Final    Comment: (NOTE) SARS-CoV-2 target nucleic acids are NOT DETECTED.  The SARS-CoV-2 RNA is generally detectable in upper and lower respiratory specimens during the acute phase of infection. The lowest concentration of SARS-CoV-2 viral copies this assay can detect is 250 copies / mL. A negative result does not preclude SARS-CoV-2 infection and should not be used as the sole basis for treatment or other patient management decisions.  A negative result may occur with improper specimen collection / handling, submission of specimen other than nasopharyngeal swab, presence of viral mutation(s) within the areas targeted by this assay, and inadequate number of viral copies (<250 copies / mL). A negative result must be combined with clinical observations, patient history, and epidemiological information.  Fact Sheet for Patients:   StrictlyIdeas.no  Fact Sheet for Healthcare  Providers: BankingDealers.co.za  This test is not yet approved or  cleared by the [place] FDA and has been authorized for detection and/or diagnosis of SARS-CoV-2 by FDA under an Emergency Use Authorization (EUA).  This EUA will remain in effect (meaning this test can be used) for the duration of the COVID-19 declaration under Section 564(b)(1) of the Act, 21 U.S.C. section 360bbb-3(b)(1), unless the authorization is terminated or revoked sooner.  Performed at [facility], [address] [place], [place] [address]   MRSA PCR Screening     Status: None   Collection Time: 02/26/20  5:16 PM   Specimen: Nasal Mucosa; Nasopharyngeal  Result Value Ref Range Status   MRSA by PCR NEGATIVE NEGATIVE Final    Comment:        The GeneXpert MRSA Assay (FDA approved for NASAL specimens only), is one component of a comprehensive MRSA colonization surveillance program. It is not intended to diagnose MRSA infection nor to guide or monitor treatment for MRSA infections. Performed at [facility], [address] [place], [place] [address]      Labs:  COVID-19 Labs  No results for input(s): DDIMER, FERRITIN, LDH, CRP in the last 72 hours.  Lab Results  Component Value Date   [place] NEGATIVE 02/25/2020      Basic Metabolic Panel: Recent Labs  Lab 02/26/20 0407 02/27/20 1542 02/28/20 0405 02/29/20 0700 03/01/20 0608  NA 142 140 140 140 140  K 4.3 4.4 3.7 3.7 3.7  CL 108 107 106 104 107  CO2 30 21* 25 28 25   GLUCOSE 114* 165* 133* 109* 105*  BUN 15 17 15  21* 15  CREATININE 0.83 0.85 0.64 0.74 0.68  CALCIUM 8.4* 8.4* 8.1* 8.1* 8.2*  MG  --  2.3  --   --   --    Liver Function Tests: Recent Labs  Lab 02/25/20 1034  AST 28  ALT 29  ALKPHOS 125  BILITOT 0.8  PROT 7.6  ALBUMIN 3.9   No results for input(s): LIPASE, AMYLASE in the last 168 hours. No results for input(s): AMMONIA in the last 168 hours. CBC: Recent  Labs  Lab 02/25/20 1034 02/26/20 0407 02/26/20 1647 02/27/20 1542 02/28/20 0405 02/29/20 0700 03/01/20 0608  WBC 8.2   < > 6.0 9.6 8.6 6.7 6.7  NEUTROABS 6.3  --   --   --   --   --   --   HGB 14.5   < > 12.1 11.9* 9.6* 9.6* 10.1*  HCT 44.6   < > 36.1 34.3* 29.2* 28.1* 30.0*  MCV 90.3   < > 90.0 86.0 90.1 88.9 88.2  PLT 344   < > 299 281 283 262 271   < > = values in this interval not displayed.   Cardiac Enzymes: No results for input(s): CKTOTAL, CKMB, CKMBINDEX, TROPONINI in the last 168 hours. BNP: BNP (last 3 results) Recent Labs    02/25/20 1034  BNP 112.6*    ProBNP (last 3 results) No results for input(s): PROBNP in the last 8760 hours.  CBG: No results for input(s): GLUCAP in the last 168 hours.   IMAGING STUDIES CT Head Wo Contrast  Result Date: 02/25/2020 CLINICAL DATA:  Status post fall with neck pain EXAM: CT HEAD WITHOUT CONTRAST CT CERVICAL SPINE WITHOUT CONTRAST TECHNIQUE: Multidetector CT imaging of the head and cervical spine was performed following the standard protocol without intravenous contrast. Multiplanar CT image reconstructions of the cervical spine were also generated. COMPARISON:  Cervical spine CT October 27, 2012 FINDINGS: CT HEAD FINDINGS Brain: No evidence of acute infarction, hemorrhage, hydrocephalus, extra-axial collection or mass lesion/mass effect. There is chronic diffuse atrophy. Vascular: No hyperdense vessel is noted. Skull: Normal. Negative for fracture or focal lesion. Sinuses/Orbits: No acute finding. Other: None. CT CERVICAL SPINE FINDINGS Alignment: There is straightening of cervical spine probably due to muscle spasm or positioning. Skull base and vertebrae: No acute fracture. No primary bone lesion or focal pathologic process. Soft tissues and spinal canal: No prevertebral fluid or swelling. No visible canal hematoma. Disc levels: Minimal decreased intervertebral spaces are identified in the mid to lower cervical spine. No significant  osteophytosis are noted. Upper chest: Negative. Other: None. IMPRESSION: 1. No focal acute intracranial abnormality identified. 2. No acute fracture or dislocation of cervical spine. 3. Straightening of cervical spine probably due to muscle spasm or positioning. Electronically Signed   By: Abelardo Diesel M.D.   On: 02/25/2020 11:08   CT Cervical Spine Wo Contrast  Result Date: 02/25/2020 CLINICAL DATA:  Status post fall with neck pain EXAM: CT HEAD WITHOUT CONTRAST CT CERVICAL SPINE WITHOUT CONTRAST TECHNIQUE: Multidetector CT imaging of the head and cervical spine was performed following the standard protocol without intravenous contrast. Multiplanar CT image reconstructions of the cervical spine were also generated. COMPARISON:  Cervical spine CT October 27, 2012 FINDINGS: CT HEAD FINDINGS Brain: No evidence of acute infarction, hemorrhage, hydrocephalus, extra-axial collection or mass lesion/mass effect. There is chronic diffuse atrophy. Vascular: No hyperdense vessel is noted. Skull: Normal. Negative for fracture or focal lesion. Sinuses/Orbits: No acute finding. Other: None. CT CERVICAL SPINE FINDINGS Alignment: There is straightening of cervical spine probably due to muscle spasm or positioning. Skull base and vertebrae: No acute fracture. No  primary bone lesion or focal pathologic process. Soft tissues and spinal canal: No prevertebral fluid or swelling. No visible canal hematoma. Disc levels: Minimal decreased intervertebral spaces are identified in the mid to lower cervical spine. No significant osteophytosis are noted. Upper chest: Negative. Other: None. IMPRESSION: 1. No focal acute intracranial abnormality identified. 2. No acute fracture or dislocation of cervical spine. 3. Straightening of cervical spine probably due to muscle spasm or positioning. Electronically Signed   By: Abelardo Diesel M.D.   On: 02/25/2020 11:08   CT Lumbar Spine Wo Contrast  Result Date: 02/25/2020 CLINICAL DATA:  54 year old  female status post fall this morning. Pain. EXAM: CT LUMBAR SPINE WITHOUT CONTRAST TECHNIQUE: Multidetector CT imaging of the lumbar spine was performed without intravenous contrast administration. Multiplanar CT image reconstructions were also generated. COMPARISON:  CT Pelvis today reported separately. CT lumbar myelogram 06/16/2019. FINDINGS: Segmentation: Normal, the same numbering system used on the prior CT myelogram. Alignment: Stable lumbar lordosis. Stable to mildly reduced chronic anterolisthesis of L5 on S1 following surgery since October. Vertebrae: Postoperative details are below. Stable lumbar vertebral height and alignment. Visible lower thoracic levels appear intact. No acute osseous abnormality identified. Visible sacrum and SI joints appear intact. Paraspinal and other soft tissues: Negative noncontrast abdominal viscera. Diverticulosis in the visible distal colon. Pelvic viscera reported separately today. Mild postoperative changes to the lower lumbar paraspinal soft tissues with no adverse features. Disc levels: T11-T12: Mild vacuum disc and endplate spurring but no evidence of stenosis. T12-L1:  Negative. L1-L2:  Subtle retrolisthesis and disc bulge.  No stenosis. L2-L3:  Negative. L3-L4: Increased epidural lipomatosis since October which partially effaces the thecal sac on series 9, image 73. Mild facet hypertrophy appears stable. Otherwise negative. L4-L5: Interval posterior and interbody fusion hardware placement. Evidence of developing interbody arthrodesis. Chronic posterior element hypertrophy, no definite posterior element arthrodesis at this time. No evidence of hardware loosening. Residual endplate spurring. No spinal stenosis suspected. However, there appears to be a small partially calcified disc fragment or less likely endplate fragment in the left L4 neural foramen best seen on series 7, image 48. This is not well correlated on axial images. L5-S1: Interval posterior and anterior  fusion hardware placement with stable to regressed chronic spondylolisthesis at this level. Evidence of developing interbody arthrodesis. No hardware loosening. Chronic posterior element hypertrophy, no definite posterior element arthrodesis at this time. Bilateral L5 foraminal patency appears improved, with no spinal stenosis suspected. IMPRESSION: 1. No acute traumatic injury identified. 2. Posterior and interbody fusion since October at L4-L5 and L5-S1 with developing interbody arthrodesis suspected at both levels. No hardware loosening, and no spinal stenosis. However, possible disc or disc osteophyte fragment in the left L4 neural foramen which is new from October. Query left L4 radiculitis. 3. Increased epidural lipomatosis at L3-L4 with new partial effacement the thecal sac. But no other adjacent segment disease. 4. CT Pelvis today reported separately. Electronically Signed   By: Genevie Ann M.D.   On: 02/25/2020 11:12   CT PELVIS WO CONTRAST  Result Date: 02/25/2020 CLINICAL DATA:  Pelvic fracture. EXAM: CT PELVIS WITHOUT CONTRAST TECHNIQUE: Multidetector CT imaging of the pelvis was performed following the standard protocol without intravenous contrast. COMPARISON:  02/19/2017 FINDINGS: Urinary Tract: The bladder and visualized portion of the urethra are normal. Distal ureters are unremarkable. Bowel: There is significant diverticular disease of the descending and sigmoid colon. No associated inflammatory changes to suggest acute diverticulitis. Postoperative changes in the region of the cecum. The  appendix is not seen. Regional small bowel loops are unremarkable. Vascular/Lymphatic: Normal noncontrast appearance of the LOWER aorta and other vascular structures. Reproductive: Hysterectomy. Within the LEFT LOWER QUADRANT there is BB a fluid attenuation structure measuring 5.2 x 3.4 x 4.8 centimeters. Just inferior to this, there is a low-attenuation 1.5 centimeter mass or lymph node in the LEFT external iliac  chain. Postoperative changes are identified in the Other: No ascites.  Anterior abdominal wall is unremarkable. Musculoskeletal: There is an acute displaced fracture of the RIGHT subcapital femoral neck, associated with foreshortening and overriding of fracture fragments. The femoral head is located in the acetabulum. No acute pelvic fractures. Mild irregularity identified in the RIGHT superior pubic ramus appears chronic. There is mild degenerative change in the SI joints. Remote LOWER lumbar-sacral fusion. IMPRESSION: 1. Acute displaced fracture of the RIGHT subcapital femoral neck. 2. No acute pelvic fractures. 3. Colonic diverticulosis. 4. Hysterectomy. 5. 5.2 x 3.4 x 4.8 centimeters fluid attenuation structure in the LEFT LOWER QUADRANT, possibly representing a seroma or lymphocele. Less likely the findings could be related to ovarian mass. Recommend further evaluation with elective CT of the abdomen and pelvis with intravenous contrast. 6. 1.5 centimeter low-attenuation mass or lymph node in the LEFT external iliac chain. 7. Remote LOWER lumbar-sacral fusion. Electronically Signed   By: Nolon Nations M.D.   On: 02/25/2020 11:16   DG Chest Portable 1 View  Result Date: 02/25/2020 CLINICAL DATA:  Preop for hip fracture post fall. EXAM: PORTABLE CHEST 1 VIEW COMPARISON:  02/11/2017 FINDINGS: Left-sided pacemaker unchanged. Lungs are adequately inflated and otherwise clear. Remainder the exam is unchanged. IMPRESSION: No active disease. Electronically Signed   By: Marin Olp M.D.   On: 02/25/2020 10:21   DG PAIN CLINIC C-ARM 1-60 MIN NO REPORT  Result Date: 02/07/2020 Fluoro was used, but no Radiologist interpretation will be provided. Please refer to "NOTES" tab for provider progress note.  DG HIP OPERATIVE UNILAT WITH PELVIS RIGHT  Result Date: 02/27/2020 CLINICAL DATA:  RIGHT hip surgery, hip replacement EXAM: OPERATIVE RIGHT HIP (WITH PELVIS IF PERFORMED) 3 VIEWS TECHNIQUE: Fluoroscopic spot  image(s) were submitted for interpretation post-operatively. COMPARISON:  February 25, 2020 FINDINGS: Post RIGHT hip hemi arthroplasty. No complicating features. AP projection of the hip arthroplasty is submitted. Initial image shows redemonstration of sub capital fracture of the RIGHT femoral neck. FLUOROSCOPY TIME:  8 seconds IMPRESSION: Post RIGHT hip hemiarthroplasty following subcapital fracture without complicating features on AP projections. Electronically Signed   By: Zetta Bills M.D.   On: 02/27/2020 13:33   DG Hip Unilat W or Wo Pelvis 2-3 Views Right  Result Date: 02/25/2020 CLINICAL DATA:  Preop right hip fracture post fall. EXAM: DG HIP (WITH OR WITHOUT PELVIS) 2-3V RIGHT COMPARISON:  CT 02/11/2017 FINDINGS: Exam demonstrates a moderately displaced subcapital fracture of the right femoral neck with superior displacement of the distal fragment. Mild degenerative changes of the hips, sacroiliac joints and spine. Fusion hardware intact over the lumbosacral spine. Mild irregularity over the right superior pubic ramus and right side of the symphysis likely due to old injury although acute fracture is possible. Remainder the exam is unchanged. IMPRESSION: 1. Moderately displaced subcapital fracture of the right femoral neck. 2. Mild irregularity of the right superior pubic ramus and right side of the symphysis likely due to old injury and much less likely acute fracture. 3. Mild degenerative changes of the hips, sacroiliac joints and spine. Fusion hardware over the lumbosacral spine intact. Electronically Signed  By: Marin Olp M.D.   On: 02/25/2020 10:20    DISCHARGE EXAMINATION: Vitals:   02/29/20 1423 02/29/20 1506 03/01/20 0036 03/01/20 0731  BP: 98/68 116/73 130/71 (!) 100/58  Pulse: 86 90 99 90  Resp: 20 17 16 16   Temp: 97.9 F (36.6 C) 98 F (36.7 C) 99.7 F (37.6 C) 98.4 F (36.9 C)  TempSrc: Oral Oral Oral Oral  SpO2: 100% 100% 100% 98%  Weight:      Height:       General  appearance: Awake alert.  In no distress Resp: Clear to auscultation bilaterally.  Normal effort Cardio: S1-S2 is normal regular.  No S3-S4.  No rubs murmurs or bruit GI: Abdomen is soft.  Nontender nondistended.  Bowel sounds are present normal.  No masses organomegaly    DISPOSITION: Home  Discharge Instructions    Call MD for:  difficulty breathing, headache or visual disturbances   Complete by: As directed    Call MD for:  extreme fatigue   Complete by: As directed    Call MD for:  persistant dizziness or light-headedness   Complete by: As directed    Call MD for:  persistant nausea and vomiting   Complete by: As directed    Call MD for:  severe uncontrolled pain   Complete by: As directed    Call MD for:  temperature >100.4   Complete by: As directed    Discharge instructions   Complete by: As directed    Follow-up with orthopedics in 2 weeks.  Go for outpatient physical therapy. Incidental left lower quadrant abnormality noted on CT scan of abdomen.  Please talk to your PCP. They can look up the report.  You were cared for by a hospitalist during your hospital stay. If you have any questions about your discharge medications or the care you received while you were in the hospital after you are discharged, you can call the unit and asked to speak with the hospitalist on call if the hospitalist that took care of you is not available. Once you are discharged, your primary care physician will handle any further medical issues. Please note that NO REFILLS for any discharge medications will be authorized once you are discharged, as it is imperative that you return to your primary care physician (or establish a relationship with a primary care physician if you do not have one) for your aftercare needs so that they can reassess your need for medications and monitor your lab values. If you do not have a primary care physician, you can call [phone] for a physician referral.   Increase  activity slowly   Complete by: As directed    No wound care   Complete by: As directed         Allergies as of 03/01/2020      Reactions   Tape Other (See Comments)   likely allergy to adhesive/ dermabond skin glue - blisters noted at edge of adhesives likely allergy to adhesive/ dermabond skin glue - blisters noted at edge of adhesives   Iodinated Diagnostic Agents Rash   On face    Toradol [ketorolac Tromethamine] Itching, Rash   Tramadol Itching, Rash   Zofran [ondansetron Hcl] Itching, Rash      Medication List    TAKE these medications   acetaminophen 500 MG tablet Commonly known as: TYLENOL Take by mouth.   albuterol 108 (90 Base) MCG/ACT inhaler Commonly known as: VENTOLIN HFA Inhale 2 puffs into the lungs every  6 (six) hours as needed for wheezing or shortness of breath. Notes to patient: Not given during hospital stay    atorvastatin 20 MG tablet Commonly known as: LIPITOR Take 1 tablet (20 mg total) by mouth daily at 6 PM. What changed: how much to take   azelastine 0.1 % nasal spray Commonly known as: ASTELIN 1 spray by Each Nare route Two (2) times a day. Use in each nostril as directed Notes to patient: Not given during hospital stay    cetirizine 10 MG tablet Commonly known as: ZYRTEC Take by mouth. Notes to patient: Not given during hospital stay    famotidine 20 MG tablet Commonly known as: PEPCID Take 1 tablet (20 mg total) by mouth 2 (two) times daily.   fluticasone 110 MCG/ACT inhaler Commonly known as: FLOVENT HFA Inhale into the lungs.   furosemide 20 MG tablet Commonly known as: LASIX Take 20 mg by mouth daily and may take an additional daily as directed What changed:   how much to take  how to take this  when to take this  additional instructions   gabapentin 300 MG capsule Commonly known as: NEURONTIN Take 300 mg by mouth 3 (three) times daily.   GNP Calcium 1200 1200-1000 MG-UNIT Chew Chew 1,200 mg by mouth daily with  breakfast. Take in combination with vitamin D and magnesium.   hydrOXYzine 25 MG tablet Commonly known as: ATARAX/VISTARIL Take 25 mg by mouth as needed. Notes to patient: Not given during hospital stay    levETIRAcetam 500 MG tablet Commonly known as: KEPPRA Take by mouth.   Magnesium 400 MG Tabs Take 400 mg by mouth daily.   methocarbamol 500 MG tablet Commonly known as: ROBAXIN Take 1 tablet (500 mg total) by mouth every 8 (eight) hours as needed for muscle spasms.   metoprolol tartrate 25 MG tablet Commonly known as: LOPRESSOR Take 0.5 tablets (12.5 mg total) by mouth 2 (two) times daily. What changed: how much to take   oxyCODONE-acetaminophen 5-325 MG tablet Commonly known as: PERCOCET/ROXICET Take 1 tablet by mouth every 6 (six) hours as needed for up to 7 days for moderate pain.   polyethylene glycol 17 g packet Commonly known as: MIRALAX / GLYCOLAX Take 17 g by mouth daily.   promethazine 12.5 MG tablet Commonly known as: PHENERGAN Take by mouth.   rivaroxaban 20 MG Tabs tablet Commonly known as: XARELTO Take 20 mg by mouth daily with supper.   senna-docusate 8.6-50 MG tablet Commonly known as: Senokot-S Take 1 tablet by mouth 2 (two) times daily.   sertraline 100 MG tablet Commonly known as: ZOLOFT TAKE (1) TABLET BY MOUTH EVERY DAY   topiramate 100 MG tablet Commonly known as: TOPAMAX Take 1 tablet (100 mg total) by mouth 2 (two) times daily.   triamcinolone 0.025 % ointment Commonly known as: KENALOG APPLY TOPICALLY TWICE DAILY AS NEEDED FOR ITCH. YOU CAN ALSO MIX WITHAQUAPHOROR VASELINE IN A 1:1 RATIO. AVOID FACE AND SKIN FOLDS Notes to patient: Not given during hospital stay    Vitamin D3 125 MCG (5000 UT) Tabs Take by mouth.         Follow-up Information    Lovell Sheehan, MD. Schedule an appointment as soon as possible for a visit in 2 week(s).   Specialty: Orthopedic Surgery Contact information: [place]  [id number] [phone]        [facility] [place]. Schedule an appointment as soon as possible for a visit in 3  week(s).   Contact information: [place] [address] [phone]               TOTAL DISCHARGE TIME: 20 minutes  [place]  [facility] Pager on [email or web address]  03/01/2020, 3:36 PM

## 2020-03-01 NOTE — Progress Notes (Signed)
Patient picked up by EMS and transported via stretcher with personal belongings and discharge instructions,. Midline removed from RUA, patient had blistering from the tape.

## 2020-03-01 NOTE — Discharge Instructions (Signed)
Hip Fracture  A hip fracture is a break in the upper part of the thigh bone (femur). This is usually the result of an injury, commonly a fall. What are the causes? This condition may be caused by:  A direct hit or injury (trauma) to the side of the hip, such as from a fall or a car accident. What increases the risk? You are more likely to develop this condition if:  You have poor balance or an unsteady walking pattern (gait). Certain conditions contribute to poor balance, including Parkinson disease and dementia.  You have thinning or weakening of your bones, such as from osteopenia or osteoporosis.  You have cancer that spreads to the leg bones.  You have certain conditions that can weaken your bones, such as thyroid disorders, intestine disorders, or a lack (deficiency) of certain nutrients.  You smoke.  You take certain medicines, such as steroids.  You have a history of broken bones. What are the signs or symptoms? Symptoms of this condition include:  Pain over the injured hip. This is commonly felt on the side of the hip or in the front groin area.  Stiffness, bruising, and swelling over the hip.  Pain with movement of the leg, especially lifting it up. Pain often gets better with rest.  Difficulty or inability to stand, walk, or use the leg to support body weight (put weight on the leg).  The leg rolling outward when lying down.  The affected leg being shorter than the other leg. How is this diagnosed? This condition may be diagnosed based on:  Your symptoms.  A physical exam.  X-rays. These may be done: ? To confirm the diagnosis. ? To determine the type and location of the fracture. ? To check for other injuries.  MRI or CT scans. These may be done if the fracture is not visible on an X-ray. How is this treated? Treatment for this condition depends on the severity and location of your fracture. In most cases, surgery is necessary. Surgery may  involve:  Repairing the fracture with a screw, nail, or rod to hold the bone in place (open reduction and internal fixation, ORIF).  Replacing the damaged parts of the femur with metal implants (hemiarthroplasty or arthroplasty). If your fracture is less severe, or if you are not eligible for surgery, you may have non-surgical treatment. Non-surgical treatment may involve:  Using crutches, a walker, or a wheelchair until your health care provider says that you can support (bear) weight on your hip.  Medicines to help reduce pain and swelling.  Having regular X-rays to monitor your fracture and make sure that it is healing.  Physical therapy. You may need physical therapy after surgery, too. Follow these instructions at home: Activity  Do not use your injured leg to support your body weight until your health care provider says that you can. ? Follow standing and walking restrictions as told by your health care provider. ? Use crutches, a walker, or a wheelchair as directed.  Avoid any activities that cause pain or irritation in your hip. Ask your health care provider what activities are safe for you.  Do not drive or use heavy machinery until your health care provider approves.  If physical therapy was prescribed, do exercises as told by your health care provider. General instructions  Take over-the-counter and prescription medicines only as told by your health care provider.  If directed, put ice on the injured area: ? Put ice in a plastic bag. ?   Place a towel between your skin and the bag. ? Leave the ice on for 20 minutes, 2-3 times a day.  Do not use any products that contain nicotine or tobacco, such as cigarettes and e-cigarettes. These can delay bone healing. If you need help quitting, ask your health care provider.  Keep all follow-up visits as told by your health care provider. This is important. How is this prevented?  To prevent falls at home: ? Use a cane, walker,  or wheelchair as directed. ? Make sure your rooms and hallways are free of clutter, obstacles, and cords. ? Install grab bars in your bedroom and bathrooms. ? Always use handrails when going up and down stairs. ? Use nightlights around the house.  Exercise regularly. Ask what forms of exercise are safe for you, such as walking and strength and balance exercises.  Visit an eye doctor regularly to have your eyesight checked. This can help prevent falls.  Make sure you get enough calcium and vitamin D.  Do not use any products that contain nicotine or tobacco, such as cigarettes and e-cigarettes. If you need help quitting, ask your health care provider.  Limit alcohol use.  If you have an underlying condition that caused your hip fracture, work with your health care provider to manage your condition. Contact a health care provider if:  Your pain gets worse or it does not get better with rest or medicine.  You develop any of the following in your leg or foot: ? Numbness. ? Tingling. ? A change in skin color (discoloration). ? Skin feeling cold to the touch. Get help right away if:  Your pain suddenly gets worse.  You cannot move your hip. Summary  A hip fracture is a break in the upper part of the thigh bone (femur).  Treatment typically require surgical management to restore stability and function to the hip.  Pain medicine and icing of the affected leg can help manage pain and swelling. Follow directions as told by your health care provider. This information is not intended to replace advice given to you by your health care provider. Make sure you discuss any questions you have with your health care provider. Document Revised: 05/01/2018 Document Reviewed: 09/13/2016 Elsevier Patient Education  2020 [employer]  

## 2020-03-01 NOTE — TOC Progression Note (Signed)
Transition of Care [facility]) - Progression Note    Patient Details  Name: Claire Mcdonald MRN: [id number] Date of Birth: Nov 20, 1965  [facility]) CM/SW [place], RN Phone Number: 03/01/2020, 3:30 PM  Clinical Narrative:    The patient and her Significant other Claire Mcdonald have both acknowledged that they may be responsible for the EMS bill if Medicaid does not cover, They both agree, I called Beverely Low at [facility] and they are going to transport at 430 PM, The bedside nurse is aware   Expected Discharge Plan: Home/Self Care Barriers to Discharge: Barriers Resolved  Expected Discharge Plan and Services Expected Discharge Plan: Home/Self Care   Discharge Planning Services: CM Consult   Living arrangements for the past 2 months: Single Family Home Expected Discharge Date: 03/01/20               DME Arranged: Berta Minor rolling DME Agency: [employer] Date DME Agency Contacted: 03/01/20 Time DME Agency Contacted: [phone] Representative spoke with at DME Agency: [facility] HH Arranged: NA           Social Determinants of Health ([place]) Interventions    Readmission Risk Interventions No flowsheet data found.

## 2020-03-12 ENCOUNTER — Ambulatory Visit: Payer: Medicaid Other

## 2020-03-14 ENCOUNTER — Ambulatory Visit: Payer: Medicaid Other

## 2020-03-21 DIAGNOSIS — R600 Localized edema: Secondary | ICD-10-CM | POA: Insufficient documentation

## 2020-04-10 DIAGNOSIS — M170 Bilateral primary osteoarthritis of knee: Secondary | ICD-10-CM | POA: Insufficient documentation

## 2020-04-12 DIAGNOSIS — N390 Urinary tract infection, site not specified: Secondary | ICD-10-CM | POA: Insufficient documentation

## 2020-04-12 DIAGNOSIS — R1900 Intra-abdominal and pelvic swelling, mass and lump, unspecified site: Secondary | ICD-10-CM | POA: Insufficient documentation

## 2020-08-16 DIAGNOSIS — M542 Cervicalgia: Secondary | ICD-10-CM | POA: Insufficient documentation

## 2020-09-02 NOTE — Progress Notes (Signed)
PROVIDER NOTE: Information contained herein reflects review and annotations entered in association with encounter. Interpretation of such information and data should be left to medically-trained personnel. Information provided to patient can be located elsewhere in the medical record under "Patient Instructions". Document created using STT-dictation technology, any transcriptional errors that may result from process are unintentional.    Patient: Claire Mcdonald  Service Category: E/M  Provider: Oswaldo Done, MD  DOB: 1966-07-01  DOS: 09/03/2020  Specialty: Interventional Pain Management  MRN: [id number]  Setting: Ambulatory outpatient  PCP: [facility]  Type: Established Patient    Referring Provider: [facility]  Location: Office  Delivery: Face-to-face     HPI  Claire Mcdonald, a 55 y.o. year old female, is here today because of her Chronic pain syndrome [G89.4]. [name] primary complain today is Back Pain (Pain in both leg) Last encounter: My last encounter with her was on Visit date not found. Pertinent problems: [name] has DDD (degenerative disc disease), lumbar; Lumbar facet arthropathy; Sacroiliac joint disease; Left-sided weakness; Bilateral carpal tunnel syndrome; Migraine headache; Right hip pain; 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HCC); Chronic low back pain (Primary Area of Pain) (Bilateral) (R>L); Chronic lower extremity pain (Secondary Area of Pain) (Bilateral) (R>L); Chronic pain syndrome; Grade 2-3 (13-16 mm) Anterolisthesis of L5 over S1; Lumbar foraminal stenosis (L5-S1) (Bilateral); Chronic lumbar radicular pain (S1) (Bilateral); Chronic musculoskeletal pain; Neurogenic pain; Spinal instability of lumbosacral region (L5-S1); Lumbosacral radiculopathy at S1; Abdominal pain; Female pelvic pain; Lower extremity edema; Lymphedema of both lower extremities; Pain; Pain in both lower extremities; Restless leg syndrome; Chronic back pain; Lumbar  facet syndrome (Bilateral) (R>L); Spondylosis without myelopathy or radiculopathy, lumbosacral region; Other headache syndrome; Pars defect of lumbar spine; Myalgia; Pain in joint, lower leg; Chronic pain of right knee; Failed back surgical syndrome; Abdominal mass; Fracture of femoral neck, right, closed (HCC); Localized edema; Osteoarthritis of both knees; S/P ORIF (open reduction internal fixation) fracture; Neck pain; and Bilateral hip pain on their pertinent problem list. Pain Assessment: Severity of Chronic pain is reported as a 8 /10. Location: Back Right,Left/pain radiaties down to her feet. Onset: More than a month ago. Quality: Aching,Throbbing. Timing: Constant. Modifying factor(s): meds, ice. Vitals:  height is 5\' 2"  (1.575 m) and weight is 190 lb (86.2 kg). Her temperature is 97.5 F (36.4 C) (abnormal). Her blood pressure is 119/92 (abnormal) and her pulse is 65. Her oxygen saturation is 97%.   Reason for encounter: follow-up evaluation for evaluation on possible intrathecal pump trial and implant.  Patient last seen on 02/07/2020 at which time she had a right lumbar facet RFA #1.  The patient had been scheduled to return around 02/21/2020 for a radiofrequency ablation of the left side.  She did not keep this appointment.  Apparently around 02/25/2020 the patient suffered a fracture of the right femoral neck.  In addition the medical record also has a diagnosis of a "pelvic mass in female" last entered on 04/12/2020.  Since patient's last visit here, she had a fall where she broke her hip.  Camitta she currently comes in on a wheelchair indicating that she was sent in by Dr. Myer Haff to see if she would be a good candidate for an intrathecal pump.  In evaluating the patient, she is allergic to contrast, iodine, and has multiple medical problems including cardiomyopathy where she is on an implantable pacemaker/defibrillator.  The patient has really not had any formal oral opioid trial and  therefore I  believe that before we commit to an intrathecal pump with its possible complications, she needs to first do an oral trial of opioids.  The patient indicates being allergic to tramadol but the PMP reveals that she has been able to tolerate the oxycodone well without any problems.  Currently the patient has no kidney or liver problems.  Today I will go ahead and start her on oxycodone/APAP 5/325 1 tablet p.o. every 6 hours as needed for pain.  I will see her back in 2 weeks to see how she is doing and I will do a UDS at that time.  Today she has been provided with all the necessary information for the opioid management including our rules and regulations.  She was also provided with information regarding CBD.  The patient was also informed that when that time comes in she develops tolerance to these medications, this tolerance will be managed by way of "Drug Holidays".  She understood and accepted.  The patient was also told not to mix these medications with any type of sleep inducer or "nerve medicine" (benzodiazepines), since it may cause a drug to drug interaction leading to respiratory depression and death.  Again the patient indicated having understood this very clearly.  The patient is interested in later undergoing some palliative lumbar facet blocks.  We will reassess after her initial oxycodone trial.  Post-Procedure Evaluation  Procedure (Visit date not found): Therapeutic right lumbar facet RFA #1 under fluoroscopic guidance and IV sedation Pre-procedure pain level: 8/10 Post-procedure: 0/10 (100% relief)  Sedation: Sedation provided.  Effectiveness during initial hour after procedure(Ultra-Short Term Relief): 100 %.  Local anesthetic used: Long-acting (4-6 hours) Effectiveness: Defined as any analgesic benefit obtained secondary to the administration of local anesthetics. This carries significant diagnostic value as to the etiological location, or anatomical origin, of the pain. Duration of  benefit is expected to coincide with the duration of the local anesthetic used.  Effectiveness during initial 4-6 hours after procedure(Short-Term Relief): 100 %.  Long-term benefit: Defined as any relief past the pharmacologic duration of the local anesthetics.  Effectiveness past the initial 6 hours after procedure(Long-Term Relief): 20 % (lasted for three days).  Current benefits: Defined as benefit that persist at this time.   Analgesia:  The patient describes having attained only a 20% improvement in the pain and feels that the blocks tend to provide her with better relief. Function: Back to baseline ROM: Back to baseline  Pharmacotherapy Assessment   Analgesic: No opioid analgesics prescribed by our practice.   Monitoring: [place] PMP: PDMP reviewed during this encounter.       Pharmacotherapy: No side-effects or adverse reactions reported. Compliance: No problems identified. Effectiveness: Clinically acceptable.  Brigitte Pulse, RN  09/03/2020  8:24 AM  Sign when Signing Visit Safety precautions to be maintained throughout the outpatient stay will include: orient to surroundings, keep bed in low position, maintain call bell within reach at all times, provide assistance with transfer out of bed and ambulation.     UDS: No results found for: SUMMARY   ROS  Constitutional: Denies any fever or chills Gastrointestinal: No reported hemesis, hematochezia, vomiting, or acute GI distress Musculoskeletal: Denies any acute onset joint swelling, redness, loss of ROM, or weakness Neurological: No reported episodes of acute onset apraxia, aphasia, dysarthria, agnosia, amnesia, paralysis, loss of coordination, or loss of consciousness  Medication Review  GNP Calcium 1200, Magnesium, Vitamin D3, acetaminophen, albuterol, atorvastatin, azelastine, cetirizine, famotidine, fluticasone, furosemide, gabapentin, hydrOXYzine,  methocarbamol, metoprolol tartrate, oxyCODONE-acetaminophen, polyethylene  glycol, promethazine, rivaroxaban, senna-docusate, sertraline, topiramate, and triamcinolone  History Review  Allergy: Ms. Columbo is allergic to tape, iodinated diagnostic agents, toradol [ketorolac tromethamine], tramadol, and zofran [ondansetron hcl]. Drug: Ms. Holmen  reports no history of drug use. Alcohol:  reports no history of alcohol use. Tobacco:  reports that she has never smoked. She has never used smokeless tobacco. Social: Ms. Dory  reports that she has never smoked. She has never used smokeless tobacco. She reports that she does not drink alcohol and does not use drugs. 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 Surgical: Ms. Verdine  has a past surgical history that includes Tubal ligation (1998); Ovarian cyst removal (Left); central line (03/25/2015); Hand surgery (Left); Cardiac catheterization (N/A, 03/27/2015); Cardiac catheterization (N/A, 04/11/2015); Cardiac catheterization (N/A, 07/16/2015); Colonoscopy with propofol (N/A, 10/01/2015); Esophagogastroduodenoscopy (egd) with propofol (N/A, 10/01/2015); Abdominal hysterectomy; and Hip Arthroplasty (Right, 02/27/2020). Family: family history includes Allergies in her daughter, son, and son; Asthma in her father and mother; Breast cancer in her maternal grandmother and paternal aunt; Cancer in her maternal grandmother; Depression in her mother; Diabetes in her father; Early death in her maternal grandmother and paternal grandmother; Heart disease in her son; Hyperlipidemia in her father and mother; Hypertension in her father and mother; Kidney disease in her  father.  Laboratory Chemistry Profile   Renal Lab Results  Component Value Date   BUN 15 03/01/2020   CREATININE 0.68 03/01/2020   BCR 21 04/16/2015   GFRAA >60 03/01/2020   GFRNONAA >60 03/01/2020     Hepatic Lab Results  Component Value Date   AST 28 02/25/2020   ALT 29 02/25/2020   ALBUMIN 3.9 02/25/2020   ALKPHOS 125 02/25/2020   LIPASE 29 03/27/2019     Electrolytes Lab Results  Component Value Date   NA 140 03/01/2020   K 3.7 03/01/2020   CL 107 03/01/2020   CALCIUM 8.2 (L) 03/01/2020   MG 2.3 02/27/2020   PHOS 3.8 02/19/2017     Bone Lab Results  Component Value Date   VD25OH 19.3 (L) 11/30/2017     Inflammation (CRP: Acute Phase) (ESR: Chronic Phase) Lab Results  Component Value Date   CRP <0.8 11/30/2017   ESRSEDRATE 19 11/30/2017   LATICACIDVEN 0.8 03/26/2015       Note: Above Lab results reviewed.  Recent Imaging Review  DG HIP OPERATIVE UNILAT WITH PELVIS RIGHT CLINICAL DATA:  RIGHT hip surgery, hip replacement  EXAM: OPERATIVE RIGHT HIP (WITH PELVIS IF PERFORMED) 3 VIEWS  TECHNIQUE: Fluoroscopic spot image(s) were submitted for interpretation post-operatively.  COMPARISON:  February 25, 2020  FINDINGS: Post RIGHT hip hemi arthroplasty. No complicating features. AP projection of the hip arthroplasty is submitted. Initial image shows redemonstration of sub capital fracture of the RIGHT femoral neck.  FLUOROSCOPY TIME:  8 seconds  IMPRESSION: Post RIGHT hip hemiarthroplasty following subcapital fracture without complicating features on AP projections.  Electronically Signed   By: Donzetta Kohut M.D.   On: 02/27/2020 13:33 Note: Reviewed        Physical Exam  General appearance: Well nourished, well developed, and well hydrated. In no apparent acute distress Mental status: Alert, oriented x 3 (person, place, &  time)       Respiratory: No evidence of acute respiratory distress Eyes: PERLA Vitals: BP (!) 119/92   Pulse 65   Temp  (!) 97.5 F (36.4 C)   Ht 5\' 2"  (1.575 m)   Wt 190 lb (86.2 kg)   LMP 01/08/2018   SpO2 97%   BMI 34.75 kg/m  BMI: Estimated body mass index is 34.75 kg/m as calculated from the following:   Height as of this encounter: 5\' 2"  (1.575 m).   Weight as of this encounter: 190 lb (86.2 kg). Ideal: Ideal body weight: 50.1 kg (110 lb 7.2 oz) Adjusted ideal body weight: 64.5 kg (142 lb 4.3 oz)  Assessment   Status Diagnosis  Controlled Controlled Controlled 1. Chronic pain syndrome   2. Chronic low back pain (Primary Area of Pain) (Bilateral) (R>L)   3. Failed back surgical syndrome   4. Chronic lower extremity pain (Secondary Area of Pain) (Bilateral) (R>L)   5. Lumbar facet syndrome (Bilateral) (R>L)   6. Uncomplicated opioid dependence (HCC)      Updated Problems: Problem  Neck Pain   Last Assessment & Plan:  Formatting of this note might be different from the original. 4 days of neck and bilateral shoulder pain. Exam at her baseline, point tenderness in traps and globally bilateral shoulder. Baseline ROM and strength. Has responded well to celebrex recently. So will trial short course of celebrex in addition to her typical home remedies. Return to clinic as needed.   Osteoarthritis of Both Knees   Last Assessment & Plan:  Formatting of this note might be different from the original. ASSESSMENT: 55 yo F with MMP with recurrent and chronic knee pain likely s/s to OA and some other issues who has gotten a couple CSI in the past 2 months with minimal to no help, asking for another today  Long talk with her that has had a couple back to back without much help so based on timing and lack of response not a good idea to repeat today  PLAN: Will look to get her some braces - wil be customized due to leg size and will see about the possibility of HA injections   Localized Edema  Abdominal Mass  Fracture of Femoral Neck, Right, Closed (Hcc)  S/P Orif (Open Reduction Internal  Fixation) Fracture   Last Assessment & Plan:  Formatting of this note might be different from the original. Condition is ongoing Medication and treatment plan as described in orders or med list. Counseled on etiology, treatment, warning signs. Patient education handout provided.   Chronic Pain of Right Knee   Last Assessment & Plan:  Formatting of this note might be different from the original. ASSESSMENT: 55 yo F with MMP who has worsening RLE pain of unclear etiology that is worsening at this time  Xray is slightly worse today  Will look to get HA approved  PLAN: Will refer to neurology for eval and tx of recurrent TIA issues   Will look to increase gabapentin to 300mg  TID   Pars Defect of Lumbar Spine  Other Headache Syndrome   Last Assessment & Plan:  ASSESSMENT: Doing better now after fall last week, HA is gone and she is back to her baseline.  Seen in the ER with normal CT head/spine and carotid US  PLAN: Follow up as needed   Restless Leg Syndrome  Female Pelvic Pain   Last Assessment & Plan:  Reviewed recent visits for pelvic pain and plan laid  out by gynecology early today (pelvic PT, [place], vaginal valium). Reinforced that I feel this is a great plan as does the patient. No questions about this plan at this time. No changes from my end.  Formatting of this note might be different from the original. Last Assessment & Plan:  Formatting of this note might be different from the original. Patient is requesting refill of intravaginal valium before appointment with obgyn in Sept. Will prescribe to fill until that time and re-assess after pelvic PT.   *ADDENDUM* 55 yo with CPP who is followed by gyn tx in the past with invtravaginal valium QOD, needs a refill until sees gyn in a month Last Assessment & Plan:  Formatting of this note might be different from the original. Patient is requesting refill of intravaginal valium before appointment with obgyn in Sept. Will  prescribe to fill until that time and re-assess after pelvic PT.   *ADDENDUM* 55 yo with CPP who is followed by gyn tx in the past with invtravaginal valium QOD, needs a refill until sees gyn in a month   Pain   Added automatically from request for surgery [phone]  Formatting of this note might be different from the original. Added automatically from request for surgery [id number]   Lower Extremity Edema   Last Assessment & Plan:  - At this time, noted to be equal and bilateral.  With this, she does not have any signs of respiratory worsening due to fluid retention.  She just appears to have a little extra LE edema. - On review of weights, She is actually lower than she was on discharge from the hospital.  - We will try BID dosing of lasix 40mg  at home for the next two days, and she will come back to the clinic on Monday for a recheck.  Given her largely normal labs yesterday, I will not recheck. - Did also recommend that she elevate her legs and try compression as well to aid in fluid movement.   - RTC in 2 days for a recheck.   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 R>L, involved up into thigh and would highly benefit from compression therapy.  - following at [facility] lymphedema clinic - counseled patient on elevation of legs, wearing compression hose, forms completed today  Formatting of this note might be different from the original. Last Assessment & Plan:  Formatting of this note might be different from the original. 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 R>L, involved up into thigh and would highly benefit from compression therapy. Currently not doing any form of compression - Referral to [facility] lymphedema clinic - counseled patient on elevation of legs, wearing compression hose, consider aquatic therapy Last Assessment & Plan:  Formatting of this note might be  different from the original. 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 R>L, involved up into thigh and would highly benefit from compression therapy. Currently not doing any form of compression - Referral to [facility] lymphedema clinic - counseled patient on elevation of legs, wearing compression hose, consider aquatic therapy   Pain in Both Lower Extremities   Last Assessment & Plan:  Pain in lower ext, improving with below interventions, had recent negative PVLs. Multifactorial including lymphedema after CVA, venous insufficiency, neuropathy.  - tubiwraps to decrease compression -Continue the Lasix 40 twice a day, and  will plan to continue this dose indefinitely as she is equivalence, assuming BMP is normal today.  -Do pedal pumps, 30 times each ankle every 3-4 hours. -Elevation -Start gabapentin for the pain, take as prescribed. - Aspercreme for topical analgesia - new PT order sent, completed home PT, very helpful.  Last Assessment & Plan:  Formatting of this note might be different from the original. Pain in lower ext, improving with below interventions, had recent negative PVLs. Multifactorial including lymphedema after CVA, venous insufficiency, neuropathy.  - tubiwraps to decrease compression -Continue the Lasix 40 twice a day, and will plan to continue this dose indefinitely as she is equivalence, assuming BMP is normal today.  -Do pedal pumps, 30 times each ankle every 3-4 hours. -Elevation -Start gabapentin for the pain, take as prescribed. - Aspercreme for topical analgesia - new PT order sent, completed home PT, very helpful.   Bilateral Hip Pain   Last Assessment & Plan:  Formatting of this note might be different from the original. ASSESSMENT: Bilateral hip pain that is mutlifactorial - OA, s/p THR on one side, body habitus, knees and back issues  Based on the amount of steroids she has had in the the past wont inject today  PLAN: Encouraged  her to stay in PT, wil try some celebrex for the pain   Could consider injection in a couple months   Myalgia   Last Assessment & Plan:  Formatting of this note might be different from the original. Unclear etiology. Possible deconditioning. Recent LFTs WNL. Will check TSH. PT referral.   Chronic Back Pain   Last Assessment & Plan:  Assessment  Patient reports continued chronic back pain. She was seen by [facility]R recently who recommended that she continue recommendations made by her pain management provider including physical activity and epidural injections that were effective in the past. She is scheduled to have another epidural tomorrow and is to have a CT myelogram later this week to further investigate causes of her chronic low back pain.   Plan  Continue epidural steroid injections with pain management. Follow up with ortho after CT myelogram for treatment recommendations.   Pain in Joint, Lower Leg   Formatting of this note might be different from the original. Last Assessment & Plan:  - xrays earlier this year with mild right-sided OA - bilateral knee pain with medial and lateral joint pain TTP, otherwise normal - bilateral corticosteroid injections provided 07/25/16.  Next possible after 10/23/16. - counseled patient on wt loss, PT referral for [facility] placed today for knee pain. Last Assessment & Plan:  Formatting of this note might be different from the original. Mild OA on last xrays.  Has had injections in past with limited relief. R/B/SE discussed will refer to sports med for further eval and tx recs.   Uncomplicated Opioid Dependence (Hcc)  Pelvic Mass in Female   Last Assessment & Plan:  Formatting of this note might be different from the original. ASSESSMENT:  Order for CT with contrast placed at last visit, but patient has not received call to schedule. Talked with radiology about contrast question - radiology is ok to pretreat prior to CT w/ contrast study.   PLAN:   Provided patient with number to call to schedule today.   Fall At Home  Tia (Transient Ischemic Attack)  Memory Loss   Last Assessment & Plan:  ASSESSMENT: 55 yo F with worsening memory loss and a strong FHx of dementia who has already had a  TIA and multiple cardiac issues   Currently on DOAC  OBJECTIVE: Will look to get some labs to look for reversible causes B12, folate, RPR   Will refer to NP for testing will look to get MRI of brain and carotid US  Formatting of this note might be different from the original. Last Assessment & Plan:  Formatting of this note might be different from the original. ASSESSMENT: 55 yo F with worsening memory loss and a strong FHx of dementia who has already had a TIA and multiple cardiac issues   Currently on DOAC  OBJECTIVE: Will look to get some labs to look for reversible causes B12, folate, RPR   Will refer to NP for testing will look to get MRI of brain and carotid US Last Assessment & Plan:  ASSESSMENT: 55 yo F with worsening memory loss and a strong FHx of dementia who has already had a TIA and multiple cardiac issues   Currently on DOAC  OBJECTIVE: Will look to get some labs to look for reversible causes B12, folate, RPR   Will refer to NP for testing will look to get MRI of brain and carotid US Last Assessment & Plan:  Formatting of this note might be different from the original. ASSESSMENT: 55 yo F with worsening memory loss and a strong FHx of dementia who has already had a TIA and multiple cardiac issues   Currently on DOAC  OBJECTIVE: Will look to get some labs to look for reversible causes B12, folate, RPR   Will refer to NP for testing will look to get MRI of brain and carotid US   Vertigo   Last Assessment & Plan:  ASSESSMENT: 48 you F with a complex PMHx with a couple days of vertigo that is very positional   Rolling over in bed changing positions  Overall normal and nonfocal neuro exam  BPPV is the most  likely etiology, no other s&s that would lead you to think cardiac or neuro at this time  PLAN: HEP program given Reinforced precautions to take  Already on lots of meds so opted to not Rx antivert  Last Assessment & Plan:  Formatting of this note might be different from the original. ASSESSMENT: 48 you F with a complex PMHx with a couple days of vertigo that is very positional   Rolling over in bed changing positions  Overall normal and nonfocal neuro exam  BPPV is the most likely etiology, no other s&s that would lead you to think cardiac or neuro at this time  PLAN: HEP program given Reinforced precautions to take  Already on lots of meds so opted to not Rx antivert   Surgical Wound Breakdown   Last Assessment & Plan:  ASSESSMENT: 55 yo F who is s/p lumbar spine surgery with an anterior and posterior approach  Anterior approach with dehiscence that is being managed by surgeon in [place].    Appears to be healing nicely  PLAN Follow up as needed   Reinforced precautions  Last Assessment & Plan:  Formatting of this note might be different from the original. ASSESSMENT: 55 yo F who is s/p lumbar spine surgery with an anterior and posterior approach  Anterior approach with dehiscence that is being managed by surgeon in [place].    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 ASSESSMENT: 55 yo F with MMP with the complaint of fatigue, lots of  reasons for fatigue/poor sleep could be medications, pain from back to name a few  PLAN: Will encourage her to maintain good sleep hygiene  Will look to get a PSA to assess for OSA v PLMS, etc   Fall   Last Assessment & Plan:  ASSESSMENT: 55 yo F with a complex PMHx who has had a bad HA for the past 3 days, worse than her typical HA and hasnt responded to the usual treatements.  Compounded today by a fall in the tub and hitting the back of her had and she is on a DOAC.  Not feeling well and the HA is worse now, denies seizure activity or CP or ICD firings.  Lost balance in tub, no LOC but was out of if for a bit and no N/V  PLAN: Due to her being on the DOAC, worsening HA not responding to treatment and the fall today will send her to the ER for a CT.  Tried to arrange as an outpt but imaging was closed.  Last Assessment & Plan:  Formatting of this note might be different from the original. ASSESSMENT: 55 yo F with a complex PMHx who has had a bad HA for the past 3 days, worse than her typical HA and hasnt responded to the usual treatements.  Compounded today by a fall in the tub and hitting the back of her had and she is on a DOAC.  Not feeling well and the HA is worse now, denies seizure activity or CP or ICD firings.  Lost balance in tub, no LOC but was out of if for a bit and no N/V  PLAN: Due to her being on the DOAC, worsening HA not responding to treatment and the fall today will send her to the ER for a CT.  Tried to arrange as an outpt but imaging was closed.   Difficulty Sleeping   Amitriptyline previuosly used with success. Will re-prescribe and continue to follow.   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  She is presenting today with several weeks of diarrhea and approx 1 week bloody diarrhea and abdominal and rectal pain. Exam shows patient is afebrile, HR in 50s (beta blocked), BP at low baseline 110/70s, diffuse abdominal tenderness R>L, conjunctival pallor. Rectal exam shows no external hemorrhoids, no rectal fissures, no inflammation or superficial tenderness. No palpable internal hemorrhoids, empty rectal vault with trace stool/mucus FOBT + on hemoccult card.   Sending to ER for complete workup considering new bleeding without obvious source, active anticoagulation, abdominal pain.  Patient amenable to plan, will go up by private vehicle.  Last Assessment & Plan:  Formatting of this note might be different from the original. Pt with complex PMH including hx embolic CVA  on xarelto, CHF on home lasix with chronic lower extremity swelling, hx vtach with new defibrillator s/p recent infection warranting antibiotics and pocket revision 09/2018, hx GERD, diverticulitis, acute pancreatitis.  She is presenting today with several weeks of diarrhea and approx 1 week bloody diarrhea and abdominal and rectal pain. Exam shows patient is afebrile, HR in 50s (beta blocked), BP at low baseline 110/70s, diffuse abdominal tenderness R>L, conjunctival pallor. Rectal exam shows no external hemorrhoids, no rectal fissures, no inflammation or superficial tenderness. No palpable internal hemorrhoids, empty rectal vault with trace stool/mucus FOBT + on hemoccult card.   Sending to ER for complete workup considering new bleeding without obvious source, active anticoagulation, abdominal pain.  Patient amenable to plan, will go up by private vehicle.   History of Implantable Cardioverter-Defibrillator (Icd) Insertion   Added automatically from request for surgery [phone]  Formatting of this note might be different from the original. Added automatically from request for surgery  [id number]   Hypotension  Dysuria   Last Assessment & Plan:  Pt reporting several days of pelvic pain and vulvar burning with urination. No fevers. No constitutional symptoms. No irritation or swelling of vulva. Abdomen nontender to palpation. No CVA tenderness. Denies vaginal discharge. UA clear. Pt does have hx of urinary incontinence.  - referral to urogyn for eval of incontinence and possible interstitial cystitis   Bradycardia   Last Assessment & Plan:  Notable bradycardia on Vs today, EKG obtained, is sinus bradycardia w/ HR 54. 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 T-wave inversions of last EKG now resolved.  Checking potassium as above. Asymptomatic w/ bradycardia, no further interventions today.  Last Assessment & Plan:  Formatting of this note might be different from the original. Notable bradycardia on Vs today, EKG obtained, is sinus bradycardia w/ HR 54. 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 T-wave inversions of last EKG now resolved.  Checking potassium as above. Asymptomatic w/ bradycardia, no further interventions today.   Hypokalemia   Last Assessment & Plan:  History of frequent hypokalemia, on Lasix, used to be on supplements, but pt currently without dentures and cannot swallow large tabs . Noted K of 3 in ED this week, will recheck today and change tabs to packets. May need to modify Lasix to prevent this if persistent, vs starting spironolactone.  EKG without changes today, asymptomatic from low K.  Last Assessment & Plan:  Assessment  Patient requests to be referred to another nephrologist as she was not able to make the appointment from her last referral. She reports occasional diffuse abdominal pain and "kidney pressure" that she believes is due to a problem with her  kidneys. She does have a history of hypokalemia which is being managed with potassium supplementation. Her last CBC was 05/16/19 and potassium, creatinine, and BUN were all within normal limits.   Plan  Repeat BMP or CMP in the future and consider referral to nephology if any abnormalities are noted.   S/P Icd (Internal Cardiac Defibrillator) Procedure   [facility] of this note might be different from the original. [employer]   Preoperative Evaluation to Rule Out Surgical Contraindication  History of Allergy to iodine  History of Placement of Internal Cardiac Defibrillator  Urinary Incontinence   Last Assessment & Plan:  -  started after stroke - wears Depends, provided by home health - needs paperwork filled out  Last Assessment & Plan:  Formatting of this note might be different from the original. - started after stroke - wears Depends, provided by home health - needs paperwork filled out  Last Assessment & Plan:  Formatting of this note might be different from the original. 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 - Patient with a history of stroke in 2018 now presenting for ED follow up from two days ago where she presented with TIA symptoms. Work up in ED reassuring with normal labs and CT scan. - BP well controlled today. Will increase Atorvastatin to 80mg  daily. - Already on anticoagulation (Xarelto for DVT) and overall low risk TIA thus unclear if she would benefit from ASA. She is already following with neurology therefore will message primary neurologist regarding recommendations for additional antiplatelet therapy. Will also encourage patient to schedule follow up appointment with them.   History of Cva (Cerebrovascular Accident)  Personal History of Other  Venous Thrombosis and Embolism   Last Assessment & Plan:  - continue xarelto.  Patient says she is compliant with daily med.   Seizure (Hcc)   Last Assessment & Plan:  Per neurology  Last Assessment & Plan:  ASSESSMENT: Hx of seizure needs refills of med  PLAN: Refill topamax and Mgoxide  Formatting of this note might be different from the original. Last Assessment & Plan:  Per neurology  Last Assessment & Plan:  Formatting of this note might be different from the original. ASSESSMENT: Hx of seizure needs refills of med  PLAN: Refill topamax and Mgoxide   History of Cardiac Arrest   Overview:  Last Assessment & Plan:  S/p sustained v tack.  ? Related to seizure  Formatting of this note might be different from the original. Last Assessment & Plan:  S/p sustained v tack.  ? Related to seizure  Last Assessment & Plan:  Formatting of this note might be different from the original. Chronic and ongoing. Patient is requesting nitroglycerin as she has needed it per EMS/ED with negative troponins on multiple occasions. Instructions and return precautions including administration, when to call EMS, and side effects including hypotension discussed with patient verbalizing understanding. Will prescribe nitroglycerin and continue to monitor.   *ADDENDUM* Pt with a hx of an MI with cardiomyopathy per old notes who at times gets chest pain and doesn't have NTG at home so has to call EMS with negative ER trip Will give her SL NTG will good instructions and precautions and she agrees   Urinary Tract Infection Without Hematuria   Last Assessment & Plan:  Formatting of this note might be different from the original. ASSESSMENT:  Patient presenting after recent ED visit 9/16 for constant suprapubic pain, intermittent right flank pain, dysuria, urinary frequency. No fevers/chills or hematuria. In the ED she was diagnosed with a UTI and prescribed a 14 day course of Keflex, which she has  been taking (now on day 6). Urine culture from the ED grew proteus with sensitivity to Keflex. Today, she has persistent symptoms and does not feel like there has been any improvement since starting antibiotics. She has R CVA tenderness on exam but is afebrile. WBC in the ED 9/16 was normal. She  likely has a complicated UTI with right kidney involvement, with features bordering on pyelonephritis. Keflex does not seem to be adequately treating her current infection so will treat with PO levofloxacin, which cultured proteus is susceptible to, for complicated UTI.   PLAN:  - Discontinue Keflex  - Initiate 7 day course 750mg  daily levofloxacin   Seasonal Allergic Rhinitis Due to Pollen   Last Assessment & Plan:  Formatting of this note might be different from the original. - Suspect her poorly controlled allergies are contributing to her HA, eustachian tube dysfunction and generalized malaise. Will ramp up her allergy regimen as below:  - Intranasal steroid: fluticasone (Flonase) 2 sprays per nostril twice a day.  - Intranasal antihistamine: azelastine (Astelin) 2 sprays per nostril twice daily.  - Fexofenadine (Allegra) once daily or as needed for symptoms. Typically claritin (loratadine) is the least effective / weakest of the antihistamines.  - Use a neti pot or nasal flush two times daily before administering nasal sprays. This can be just during flares or anytime. - Follow up with PCP in 3 weeks, if no improvement consider other headache treatment   Acute Cystitis Without Hematuria   Last Assessment & Plan:  Formatting of this note might be different from the original. - Patient reporting dysuria with UA consistent with UTI. No systemic symptoms thus will treat with Macrobid x 5 days. Has grown E coli susceptible in the past. Will follow up on culture and change antibiotics if indicated. Return precautions discussed.  Last Assessment & Plan:  Formatting of this note might be different from the  original. Clinical S/S of UTI without vital sign abnormalities.  - Given prior culture with Ecoli resistant to tetracycline, bactrim and ampicillin last month and treatment with macrobid, will treat with Ciprofloxacin for 5 days.  - Return precautions given.  - Urine culture obtained.   Onychomycosis   Last Assessment & Plan:  ASSESSMENT: Likely onychomycosis of the left great toe nail  PLAN: With only 1 nail involved will look to try some OTC remedies  Will start with daily vicks vapor rub  Follow up if no change  Last Assessment & Plan:  Formatting of this note might be different from the original. ASSESSMENT: Likely onychomycosis of the left great toe nail  PLAN: With only 1 nail involved will look to try some OTC remedies  Will start with daily vicks vapor rub  Follow up if no change   Bacterial Vaginosis  Elevated Amylase   Last Assessment & Plan:  ASSESSMENT: Elevated amylase the other day in the ER 258 bad isues in Jan 20 with up to 1678  CT both times normal without evidence of pancreatitis  Unclear as to the etiology and pain today is no longer in that area  PLAN: Follow up as needed  Last Assessment & Plan:  Formatting of this note might be different from the original. ASSESSMENT: Elevated amylase the other day in the ER 258 bad isues in Jan 20 with up to 1678  CT both times normal without evidence of pancreatitis  Unclear as to the etiology and pain today is no longer in that area  PLAN: Follow up as needed   Uti Symptoms   Last Assessment & Plan:  ASSESSMENT: Still with burning urination without other symptoms such as urgency, frequency or hematuria  Has persisted since last visit   Did have intermediate wet prep  PLAN: Will treat with flagyl 500mg  PO BID for 7 days   Clostridium Difficile  Colitis   Last Assessment & Plan:  Reviewed recommendations and pursuit of fecal transplant by GI, but first to do endoscopy and colonoscopy on 6/9.  Reviewed bowel prep instructions as she had questions. Diarrhea, though still bothersome, has fortunately remained stable, and she is keeping up with hydration orally.  Formatting of this note might be different from the original. Last Assessment & Plan:  Formatting of this note might be different from the original. Reviewed recommendations and pursuit of fecal transplant by GI, but first to do endoscopy and colonoscopy on 6/9. Reviewed bowel prep instructions as she had questions. Diarrhea, though still bothersome, has fortunately remained stable, and she is keeping up with hydration orally. Last Assessment & Plan:  Formatting of this note might be different from the original. Reviewed recommendations and pursuit of fecal transplant by GI, but first to do endoscopy and colonoscopy on 6/9. Reviewed bowel prep instructions as she had questions. Diarrhea, though still bothersome, has fortunately remained stable, and she is keeping up with hydration orally.  Last Assessment & Plan:  Formatting of this note might be different from the original. ASSESSMENT: Needs a refill of her abx pending FMT in a month or so  PLAN: Abx rx sent in   Bronchitis   Last Assessment & Plan:  ASSESSMENT: 55 yo F with a hx of asthma with some URI symptoms without fevers at this time  Has been inc the use of her albuterol due to inc SOB at this time  Has been on doxy and keflex and no fevers or productive cough so think bacterial infection is less likely  PLAN: Prednisone 40mg  PO QD for 5 days Stay on her other meds the flovent and albuterol   Follow up if starts to get a fever or symptoms worsening  Last Assessment & Plan:  Formatting of this note might be different from the original. ASSESSMENT: 55 yo F with a hx of asthma with some URI symptoms without fevers at this time  Has been inc the use of her albuterol due to inc SOB at this time  Has been on doxy and keflex and no fevers or productive cough  so think bacterial infection is less likely  PLAN: Prednisone 40mg  PO QD for 5 days Stay on her other meds the flovent and albuterol   Follow up if starts to get a fever or symptoms worsening   Dysfunction of Both Eustachian Tubes   Last Assessment & Plan:  ASSESSMENT: Bilateral, Right > left, ETD with what is a likely viral process  PLAN: Return to using the flonase daily and use the OTC antihistamines  flonase 2 sqts daily  Take the zyrtec QD   Saline sinus rinse is an Hotel manager of this note might be different from the original. Last Assessment & Plan:  Formatting of this note might be different from the original. ASSESSMENT: Bilateral, Right > left, ETD with what is a likely viral process  PLAN: Return to using the flonase daily and use the OTC antihistamines  flonase 2 sqts daily  Take the zyrtec QD   Saline sinus rinse is an option als Last Assessment & Plan:  Formatting of this note might be different from the original. ASSESSMENT: Bilateral, Right > left, ETD with what is a likely viral process  PLAN: Return to using the flonase daily and use the OTC antihistamines  flonase 2 sqts daily  Take the zyrtec QD   Saline sinus rinse is an option als  Diverticulitis  Poor Appetite   Last Assessment & Plan:  Patient complaining of poor appetite for approximately 1 week without other clear associated symptoms.  She is down 3 pounds from prior visit.  She has not had any other medication changes that could have contributed to this.  I reviewed her medication list and there does not appear to be any at risk medications for poor appetite aside from Topamax which she has been on for years.  Otherwise her vitals are normal for her.  She is largely without associated GI symptoms, though does vomit if she tries to force herself to eat, but denies nausea.  She does have some constipation having small bowel movements every other day, and does have some lower  abdominal tenderness so constipation may be contributing somewhat.  She also became quite tearful during exam when discussing the holidays and her mother, however she does not link her stress or depressive symptoms to her poor appetite.  She declines intervention for depressed mood today.  She declines intervention for possible constipation today.  We will start with general work-up including CBC, CMP as uremia or abnormalities of the liver may contribute to poor appetite.  No other B symptoms to suggest malignancy, the patient stated multiple times during the interview that she would like a biopsy to disprove leukemia as this runs in her family.  Will obtain abdominal film to evaluate extent of constipation.  We will plan to revisit in 1 week with this information and develop treatment plan from there.  Last Assessment & Plan:  Formatting of this note might be different from the original. Patient complaining of poor appetite for approximately 1 week without other clear associated symptoms.  She is down 3 pounds from prior visit.  She has not had any other medication changes that could have contributed to this.  I reviewed her medication list and there does not appear to be any at risk medications for poor appetite aside from Topamax which she has been on for years.  Otherwise her vitals are normal for her.  She is largely without associated GI symptoms, though does vomit if she tries to force herself to eat, but denies nausea.  She does have some constipation having small bowel movements every other day, and does have some lower abdominal tenderness so constipation may be contributing somewhat.  She also became quite tearful during exam when discussing the holidays and her mother, however she does not link her stress or depressive symptoms to her poor appetite.  She declines intervention for depressed mood today.  She declines intervention for possible constipation today.  We will start with general work-up  including CBC, CMP as uremia or abnormalities of the liver may contribute to poor appetite.  No other B symptoms to suggest malignancy, the patient stated multiple times during the interview that she would like a biopsy to disprove leukemia as this runs in her family.  Will obtain abdominal film to evaluate extent of constipation.  We will plan to revisit in 1 week with this information and develop treatment plan from there.   Burning With Urination   Last Assessment & Plan:  Pt reporting several days of pelvic pain and vulvar burning with urination. No fevers. No constitutional symptoms. No irritation or swelling of vulva. Abdomen nontender to palpation. No CVA tenderness. Denies vaginal discharge. UA clear. Pt does have hx of urinary incontinence.  - referral to urogyn for eval of incontinence and possible interstitial cystitis   Diarrhea  Last Assessment & Plan:  Recently diagnosed with diverticulitis and was started on antibiotics. She did not complete course given diarrhea. Symptoms are now resolving off antibiotics but continues to have some loose stools (although improving overall). She appears well and in no acute distress. Abdominal examination is benign. Repeated labs today and stable to improving from ER visit.   Overall reassurance provided.   If symptoms persist, would recommend repeat colonoscopy.  Last Assessment & Plan:  Formatting of this note might be different from the original. ASSESSMENT:  Patient presenting with several months of intermittent yellow-green, greasy diarrhea, discussed at last visit on 9/1. She has a history of chronic diarrhea s/p fecal transplant for clostridium difficile colitis in the past. No fevers/chills or other infections symptoms. The stool sample she dropped off after her last visit was well-formed so analysis was not run. Intermittent nature of her diarrhea is reassuring against c.diff but given her history of c.diff and antibiotic use over the past  several months (for UTI, hip fracture), will pursue repeat stool sample collection and instruct patient to give a sample when she is having diarrhea symptoms. Will also check for malabsorption given "greasy" nature of stool. Will check stool guaiac and H. Pylori as well given chronic nature of diarrhea and gastritis visible on recent CT abdomen (in ED 9/16). Will try a bile acid sequestrant in the meantime to see if this helps diarrhea.   PLAN:  - Stool analysis for the following: C diff assay, fecal lactoferrin, stool guaiac, H. 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 Currently only taking flonase. Advised increasing dose and restarting zyrtec.  Last Assessment & Plan:  Chronic and stable. Will refill zyrtec today and continue to monitor.  Formatting of this note might be different from the original. Last Assessment & Plan:  Formatting of this note might be different from the original. Chronic and stable. Will refill zyrtec today and continue to monitor. Last Assessment & Plan:  Formatting of this note might be different from the original. Chronic and stable. Will refill zyrtec today and continue to monitor.   Eczema   Last Assessment & Plan:  No eczematous patches noted on exam today, however patient notes that they come and go, no current flare. Advised plenty of moisturization with Cetaphil, Aquaphor, Aveeno, or similar. When has flare, use triamcinolone BID, then cover with moisturizer. Use for 2 weeks, then stop.  Formatting of this note might be different from the original. Last Assessment & Plan:  Formatting of this note might be different from the original. No eczematous patches noted on exam today, however patient notes that they come and go, no current flare. Advised plenty of  moisturization with Cetaphil, Aquaphor, Aveeno, or similar. When has flare, use triamcinolone BID, then cover with moisturizer. Use for 2 weeks, then stop. Last Assessment & Plan:  Formatting of this note might be different from the original. No eczematous patches noted on exam today, however patient notes that they come and go, no current flare. Advised plenty of moisturization with Cetaphil, Aquaphor, Aveeno, or similar. When has flare, use triamcinolone BID, then cover with moisturizer. Use for 2 weeks, then stop.   H/O: Hysterectomy  Mild Persistent Asthma Without Complication   Last Assessment & Plan:  Patient did  let me know she was never able to pick up refills of her Arnuity Ellipta due to the cost.  She was hopeful to find an alternative that would be covered by her Medicaid.  I sent in a prescription for a low-dose Flovent inhaler for the patient as this is covered on [place] Medicaid formulary.  I did let her know that as she follows up with her PCP, he may make additional changes to her asthma regimen but this week she will have a controller medicine at home.  Last Assessment & Plan:  Formatting of this note might be different from the original. Chronic and ongoing. Patient is requesting refill of her inhalers today. Will refill and re-evaluate at upcoming in-person visit.   Essential Hypertension   Last Assessment & Plan:   Stable, continue current management  Last Assessment & Plan:  ASSESSMENT: Hx of HTN, heart failure  Needs refills on her lasix  PLAN: Ordered her refills  Last Assessment & Plan:  Formatting of this note might be different from the original. ASSESSMENT: Hx of HTN, heart failure  Needs refills on her lasix  PLAN: Ordered her refills  Last Assessment & Plan:  Formatting of this note might be different from the original. Condition is stable Medication and treatment plan as described in orders or med list. Counseled on etiology, treatment, warning  signs. Patient education handout provided.   Acute Postoperative Pain (Resolved)  Ddd (Degenerative Disc Disease), Lumbosacral (Resolved)  DJD (degenerative joint disease)sacroiliac joint (Resolved)  Sacroiliac Joint Disease (Resolved)  Troponin Level Elevated (Resolved)  Acute pulmonary embolism (HCC) (Resolved)    Plan of Care  Problem-specific:  No problem-specific Assessment & Plan notes found for this encounter.  Ms. AAMIA UPTEGROVE has a current medication list which includes the following long-term medication(s): albuterol, atorvastatin, azelastine, gnp calcium 1200, cetirizine, famotidine, fluticasone, furosemide, metoprolol tartrate, rivaroxaban, and topiramate.  Pharmacotherapy (Medications Ordered): Meds ordered this encounter  Medications  . oxyCODONE-acetaminophen (PERCOCET) 5-325 MG tablet    Sig: Take 1 tablet by mouth every 6 (six) hours as needed for severe pain. Must last 30 days.    Dispense:  120 tablet    Refill:  0    Chronic Pain: STOP Act (Not applicable) Fill 1 day early if closed on refill date. Avoid benzodiazepines within 8 hours of opioids   Orders:  Orders Placed This Encounter  Procedures  . Nursing Instructions:    Please complete this patient's postprocedure evaluation.    Scheduling Instructions:     Please complete this patient's postprocedure evaluation.  . Nursing Instructions:    1. Medication Agreement: Please go over agreement with the patient. Have the patient read and sign the agreement. Provide patient with a copy of the signed agreement. 2. Make sure that the patient has completed the ORT (Opioid Risk Tool). 3. Provide the patient with a copy of our "Medicatiion Policy", "Medication Recommendations and Reminders", and "CBD information". 4. Remind the patient to always bring their medications and medication bottles (even if empty) to all appointments except for procedure appointments.    Scheduling Instructions:     Sign "Medication  Agreement", complete "Opioid Risk Tool", inform patient of our practice "Medication Policies" (Pill counts, always bring bottles, except on procedure days).  . Nursing communication    Scheduling Instructions:     Complete/update the opioid risk tool (ORT) questionnaire.   Follow-up plan:   Return in about 2 weeks (around 09/17/2020) for (F2F), (Med Mgmt) to evaluate oxycodone trial.  Interventional management options: Planned, scheduled, and/or pending:       Considering:   (Prior patient of Dr. Ewing Schlein) NOTE: Xarelto Anticoagulation. (Stop: 3 days  Restart: 6 hrs) CONTRAST ALLERGY.  Diagnosticbilateral L5 TFESI #3  (HX of not following-up after procedures) Possiblebilateral lumbar facet RFA  Possible intrathecal trial for pump.   PRN Procedures:   Palliative bilateral lumbar facet block #3  Palliative bilateral L5 TFESI #3     Recent Visits No visits were found meeting these conditions. Showing recent visits within past 90 days and meeting all other requirements Today's Visits Date Type Provider Dept  09/03/20 Office Visit Delano Metz, MD [facility]  Showing today's visits and meeting all other requirements Future Appointments Date Type Provider Dept  09/17/20 Appointment Delano Metz, MD [facility]  Showing future appointments within next 90 days and meeting all other requirements  I discussed the assessment and treatment plan with the patient. The patient was provided an opportunity to ask questions and all were answered. The patient agreed with the plan and demonstrated an understanding of the instructions.  Patient advised to call back or seek an in-person evaluation if the symptoms or condition worsens.  Duration of encounter: 49 minutes.  Note by: Oswaldo Done, MD Date: 09/03/2020; Time: 9:50 AM

## 2020-09-03 ENCOUNTER — Encounter: Payer: Self-pay | Admitting: Pain Medicine

## 2020-09-03 ENCOUNTER — Other Ambulatory Visit: Payer: Self-pay

## 2020-09-03 ENCOUNTER — Ambulatory Visit: Payer: Medicaid Other | Attending: Pain Medicine | Admitting: Pain Medicine

## 2020-09-03 VITALS — BP 119/92 | HR 65 | Temp 97.5°F | Ht 62.0 in | Wt 190.0 lb

## 2020-09-03 DIAGNOSIS — M79604 Pain in right leg: Secondary | ICD-10-CM | POA: Diagnosis present

## 2020-09-03 DIAGNOSIS — G894 Chronic pain syndrome: Secondary | ICD-10-CM | POA: Diagnosis not present

## 2020-09-03 DIAGNOSIS — M5441 Lumbago with sciatica, right side: Secondary | ICD-10-CM | POA: Diagnosis present

## 2020-09-03 DIAGNOSIS — G8929 Other chronic pain: Secondary | ICD-10-CM | POA: Insufficient documentation

## 2020-09-03 DIAGNOSIS — M5442 Lumbago with sciatica, left side: Secondary | ICD-10-CM | POA: Insufficient documentation

## 2020-09-03 DIAGNOSIS — F1120 Opioid dependence, uncomplicated: Secondary | ICD-10-CM | POA: Insufficient documentation

## 2020-09-03 DIAGNOSIS — M47816 Spondylosis without myelopathy or radiculopathy, lumbar region: Secondary | ICD-10-CM | POA: Insufficient documentation

## 2020-09-03 DIAGNOSIS — M79605 Pain in left leg: Secondary | ICD-10-CM | POA: Diagnosis present

## 2020-09-03 DIAGNOSIS — M961 Postlaminectomy syndrome, not elsewhere classified: Secondary | ICD-10-CM | POA: Diagnosis present

## 2020-09-03 MED ORDER — OXYCODONE-ACETAMINOPHEN 5-325 MG PO TABS: 1.0000 | ORAL_TABLET | Freq: Four times a day (QID) | ORAL | 0 refills | Status: DC | PRN

## 2020-09-03 NOTE — Progress Notes (Signed)
Safety precautions to be maintained throughout the outpatient stay will include: orient to surroundings, keep bed in low position, maintain call bell within reach at all times, provide assistance with transfer out of bed and ambulation.  

## 2020-09-03 NOTE — Patient Instructions (Addendum)
____________________________________________________________________________________________  Medication Rules  Purpose: To inform patients, and their family members, of our rules and regulations.  Applies to: All patients receiving prescriptions (written or electronic).  Pharmacy of record: Pharmacy where electronic prescriptions will be sent. If written prescriptions are taken to a different pharmacy, please inform the nursing staff. The pharmacy listed in the electronic medical record should be the one where you would like electronic prescriptions to be sent.  Electronic prescriptions: In compliance with the [place] Strengthen Opioid Misuse Prevention (STOP) Act of 2017 (Session Law 2017-74/H243), effective August 25, 2018, all controlled substances must be electronically prescribed. Calling prescriptions to the pharmacy will cease to exist.  Prescription refills: Only during scheduled appointments. Applies to all prescriptions.  NOTE: The following applies primarily to controlled substances (Opioid* Pain Medications).   Type of encounter (visit): For patients receiving controlled substances, face-to-face visits are required. (Not an option or up to the patient.)  Patient's responsibilities: 1. Pain Pills: Bring all pain pills to every appointment (except for procedure appointments). 2. Pill Bottles: Bring pills in original pharmacy bottle. Always bring the newest bottle. Bring bottle, even if empty. 3. Medication refills: You are responsible for knowing and keeping track of what medications you take and those you need refilled. The day before your appointment: write a list of all prescriptions that need to be refilled. The day of the appointment: give the list to the admitting nurse. Prescriptions will be written only during appointments. No prescriptions will be written on procedure days. If you forget a medication: it will not be "Called in", "Faxed", or "electronically sent".  You will need to get another appointment to get these prescribed. No early refills. Do not call asking to have your prescription filled early. 4. Prescription Accuracy: You are responsible for carefully inspecting your prescriptions before leaving our office. Have the discharge nurse carefully go over each prescription with you, before taking them home. Make sure that your name is accurately spelled, that your address is correct. Check the name and dose of your medication to make sure it is accurate. Check the number of pills, and the written instructions to make sure they are clear and accurate. Make sure that you are given enough medication to last until your next medication refill appointment. 5. Taking Medication: Take medication as prescribed. When it comes to controlled substances, taking less pills or less frequently than prescribed is permitted and encouraged. Never take more pills than instructed. Never take medication more frequently than prescribed.  6. Inform other Doctors: Always inform, all of your healthcare providers, of all the medications you take. 7. Pain Medication from other Providers: You are not allowed to accept any additional pain medication from any other Doctor or Healthcare provider. There are two exceptions to this rule. (see below) In the event that you require additional pain medication, you are responsible for notifying us, as stated below. 8. Cough Medicine: Often these contain an opioid, such as codeine or hydrocodone. Never accept or take cough medicine containing these opioids if you are already taking an opioid* medication. The combination may cause respiratory failure and death. 9. Medication Agreement: You are responsible for carefully reading and following our Medication Agreement. This must be signed before receiving any prescriptions from our practice. Safely store a copy of your signed Agreement. Violations to the Agreement will result in no further prescriptions.  (Additional copies of our Medication Agreement are available upon request.) 10. Laws, Rules, & Regulations: All patients are expected to follow all   Federal and State Laws, Statutes, Rules, & Regulations. Ignorance of the Laws does not constitute a valid excuse.  11. Illegal drugs and Controlled Substances: The use of illegal substances (including, but not limited to marijuana and its derivatives) and/or the illegal use of any controlled substances is strictly prohibited. Violation of this rule may result in the immediate and permanent discontinuation of any and all prescriptions being written by our practice. The use of any illegal substances is prohibited. 12. Adopted CDC guidelines & recommendations: Target dosing levels will be at or below 60 MME/day. Use of benzodiazepines** is not recommended.  Exceptions: There are only two exceptions to the rule of not receiving pain medications from other Healthcare Providers. 1. Exception #1 (Emergencies): In the event of an emergency (i.e.: accident requiring emergency care), you are allowed to receive additional pain medication. However, you are responsible for: As soon as you are able, call our office [phone], at any time of the day or night, and leave a message stating your name, the date and nature of the emergency, and the name and dose of the medication prescribed. In the event that your call is answered by a member of our staff, make sure to document and save the date, time, and the name of the person that took your information.  2. Exception #2 (Planned Surgery): In the event that you are scheduled by another doctor or dentist to have any type of surgery or procedure, you are allowed (for a period no longer than 30 days), to receive additional pain medication, for the acute post-op pain. However, in this case, you are responsible for picking up a copy of our "Post-op Pain Management for Surgeons" handout, and giving it to your surgeon or dentist. This  document is available at our office, and does not require an appointment to obtain it. Simply go to our office during business hours (Monday-Thursday from 8:00 AM to 4:00 PM) (Friday 8:00 AM to 12:00 Noon) or if you have a scheduled appointment with us, prior to your surgery, and ask for it by name. In addition, you are responsible for: calling our office [phone], at any time of the day or night, and leaving a message stating your name, name of your surgeon, type of surgery, and date of procedure or surgery. Failure to comply with your responsibilities may result in termination of therapy involving the controlled substances.  *Opioid medications include: morphine, codeine, oxycodone, oxymorphone, hydrocodone, hydromorphone, meperidine, tramadol, tapentadol, buprenorphine, fentanyl, methadone. **Benzodiazepine medications include: diazepam (Valium), alprazolam (Xanax), clonazepam (Klonopine), lorazepam (Ativan), clorazepate (Tranxene), chlordiazepoxide (Librium), estazolam (Prosom), oxazepam (Serax), temazepam (Restoril), triazolam (Halcion) (Last updated: 07/23/2020) ____________________________________________________________________________________________   ____________________________________________________________________________________________  Medication Recommendations and Reminders  Applies to: All patients receiving prescriptions (written and/or electronic).  Medication Rules & Regulations: These rules and regulations exist for your safety and that of others. They are not flexible and neither are we. Dismissing or ignoring them will be considered "non-compliance" with medication therapy, resulting in complete and irreversible termination of such therapy. (See document titled "Medication Rules" for more details.) In all conscience, because of safety reasons, we cannot continue providing a therapy where the patient does not follow instructions.  Pharmacy of record:   Definition:  This is the pharmacy where your electronic prescriptions will be sent.   We do not endorse any particular pharmacy, however, we have experienced problems with [facility] not securing enough medication supply for the community.  We do not restrict you in your choice of pharmacy. However,   once we write for your prescriptions, we will NOT be re-sending more prescriptions to fix restricted supply problems created by your pharmacy, or your insurance.   The pharmacy listed in the electronic medical record should be the one where you want electronic prescriptions to be sent.  If you choose to change pharmacy, simply notify our nursing staff.  Recommendations:  Keep all of your pain medications in a safe place, under lock and key, even if you live alone. We will NOT replace lost, stolen, or damaged medication.  After you fill your prescription, take 1 week's worth of pills and put them away in a safe place. You should keep a separate, properly labeled bottle for this purpose. The remainder should be kept in the original bottle. Use this as your primary supply, until it runs out. Once it's gone, then you know that you have 1 week's worth of medicine, and it is time to come in for a prescription refill. If you do this correctly, it is unlikely that you will ever run out of medicine.  To make sure that the above recommendation works, it is very important that you make sure your medication refill appointments are scheduled at least 1 week before you run out of medicine. To do this in an effective manner, make sure that you do not leave the office without scheduling your next medication management appointment. Always ask the nursing staff to show you in your prescription , when your medication will be running out. Then arrange for the receptionist to get you a return appointment, at least 7 days before you run out of medicine. Do not wait until you have 1 or 2 pills left, to come in. This is very poor planning and  does not take into consideration that we may need to cancel appointments due to bad weather, sickness, or emergencies affecting our staff.  DO NOT ACCEPT A "Partial Fill": If for any reason your pharmacy does not have enough pills/tablets to completely fill or refill your prescription, do not allow for a "partial fill". The law allows the pharmacy to complete that prescription within 72 hours, without requiring a new prescription. If they do not fill the rest of your prescription within those 72 hours, you will need a separate prescription to fill the remaining amount, which we will NOT provide. If the reason for the partial fill is your insurance, you will need to talk to the pharmacist about payment alternatives for the remaining tablets, but again, DO NOT ACCEPT A PARTIAL FILL, unless you can trust your pharmacist to obtain the remainder of the pills within 72 hours.  Prescription refills and/or changes in medication(s):   Prescription refills, and/or changes in dose or medication, will be conducted only during scheduled medication management appointments. (Applies to both, written and electronic prescriptions.)  No refills on procedure days. No medication will be changed or started on procedure days. No changes, adjustments, and/or refills will be conducted on a procedure day. Doing so will interfere with the diagnostic portion of the procedure.  No phone refills. No medications will be "called into the pharmacy".  No Fax refills.  No weekend refills.  No Holliday refills.  No after hours refills.  Remember:  Business hours are:  Monday to Thursday 8:00 AM to 4:00 PM Provider's Schedule: Roen Macgowan, MD - Appointments are:  Medication management: Monday and Wednesday 8:00 AM to 4:00 PM Procedure day: Tuesday and Thursday 7:30 AM to 4:00 PM Bilal Lateef, MD - Appointments are:    Medication management: Tuesday and Thursday 8:00 AM to 4:00 PM Procedure day: Monday and Wednesday  7:30 AM to 4:00 PM (Last update: 03/14/2020) ____________________________________________________________________________________________   ____________________________________________________________________________________________  CBD (cannabidiol) WARNING  Applicable to: All individuals currently taking or considering taking CBD (cannabidiol) and, more important, all patients taking opioid analgesic controlled substances (pain medication). (Example: oxycodone; oxymorphone; hydrocodone; hydromorphone; morphine; methadone; tramadol; tapentadol; fentanyl; buprenorphine; butorphanol; dextromethorphan; meperidine; codeine; etc.)  Legal status: CBD remains a Schedule I drug prohibited for any use. CBD is illegal with one exception. In the [place], CBD has a limited Food and Drug Administration (FDA) approval for the treatment of two specific types of epilepsy disorders. Only one CBD product has been approved by the FDA for this purpose: "Epidiolex". FDA is aware that some companies are marketing products containing cannabis and cannabis-derived compounds in ways that violate the Federal Food, Drug and Cosmetic Act (FD&C Act) and that may put the health and safety of consumers at risk. The FDA, a Federal agency, has not enforced the CBD status since 2018.   Legality: Some manufacturers ship CBD products nationally, which is illegal. Often such products are sold online and are therefore available throughout the country. CBD is openly sold in head shops and health food stores in some states where such sales have not been explicitly legalized. Selling unapproved products with unsubstantiated therapeutic claims is not only a violation of the law, but also can put patients at risk, as these products have not been proven to be safe or effective. Federal illegality makes it difficult to conduct research on CBD.  Reference: "FDA Regulation of Cannabis and Cannabis-Derived Products, Including Cannabidiol  (CBD)" - https://www.fda.gov/news-events/public-health-focus/fda-regulation-cannabis-and-cannabis-derived-products-including-cannabidiol-cbd  Warning: CBD is not FDA approved and has not undergo the same manufacturing controls as prescription drugs.  This means that the purity and safety of available CBD may be questionable. Most of the time, despite manufacturer's claims, it is contaminated with THC (delta-9-tetrahydrocannabinol - the chemical in marijuana responsible for the "HIGH").  When this is the case, the THC contaminant will trigger a positive urine drug screen (UDS) test for Marijuana (carboxy-THC). Because a positive UDS for any illicit substance is a violation of our medication agreement, your opioid analgesics (pain medicine) may be permanently discontinued.  MORE ABOUT CBD  General Information: CBD  is a derivative of the Marijuana (cannabis sativa) plant discovered in 1940. It is one of the 113 identified substances found in Marijuana. It accounts for up to 40% of the plant's extract. As of 2018, preliminary clinical studies on CBD included research for the treatment of anxiety, movement disorders, and pain. CBD is available and consumed in multiple forms, including inhalation of smoke or vapor, as an aerosol spray, and by mouth. It may be supplied as an oil containing CBD, capsules, dried cannabis, or as a liquid solution. CBD is thought not to be as psychoactive as THC (delta-9-tetrahydrocannabinol - the chemical in marijuana responsible for the "HIGH"). Studies suggest that CBD may interact with different biological target receptors in the body, including cannabinoid and other neurotransmitter receptors. As of 2018 the mechanism of action for its biological effects has not been determined.  Side-effects  Adverse reactions: Dry mouth, diarrhea, decreased appetite, fatigue, drowsiness, malaise, weakness, sleep disturbances, and others.  Drug interactions: CBC may interact with other  medications such as blood-thinners. (Last update: 03/31/2020) ____________________________________________________________________________________________   ____________________________________________________________________________________________  Drug Holidays (Slow)  What is a "Drug Holiday"? Drug Holiday: is the name given to the period of time during   which a patient stops taking a medication(s) for the purpose of eliminating tolerance to the drug.  Benefits . Improved effectiveness of opioids. . Decreased opioid dose needed to achieve benefits. . Improved pain with lesser dose.  What is tolerance? Tolerance: is the progressive decreased in effectiveness of a drug due to its repetitive use. With repetitive use, the body gets use to the medication and as a consequence, it loses its effectiveness. This is a common problem seen with opioid pain medications. As a result, a larger dose of the drug is needed to achieve the same effect that used to be obtained with a smaller dose.  How long should a "Drug Holiday" last? You should stay off of the pain medicine for at least 14 consecutive days. (2 weeks)  Should I stop the medicine "cold [place]"? No. You should always coordinate with your Pain Specialist so that he/she can provide you with the correct medication dose to make the transition as smoothly as possible.  How do I stop the medicine? Slowly. You will be instructed to decrease the daily amount of pills that you take by one (1) pill every seven (7) days. This is called a "slow downward taper" of your dose. For example: if you normally take four (4) pills per day, you will be asked to drop this dose to three (3) pills per day for seven (7) days, then to two (2) pills per day for seven (7) days, then to one (1) per day for seven (7) days, and at the end of those last seven (7) days, this is when the "Drug Holiday" would start.   Will I have withdrawals? By doing a "slow downward  taper" like this one, it is unlikely that you will experience any significant withdrawal symptoms. Typically, what triggers withdrawals is the sudden stop of a high dose opioid therapy. Withdrawals can usually be avoided by slowly decreasing the dose over a prolonged period of time. If you do not follow these instructions and decide to stop your medication abruptly, withdrawals may be possible.  What are withdrawals? Withdrawals: refers to the wide range of symptoms that occur after stopping or dramatically reducing opiate drugs after heavy and prolonged use. Withdrawal symptoms do not occur to patients that use low dose opioids, or those who take the medication sporadically. Contrary to benzodiazepine (example: Valium, Xanax, etc.) or alcohol withdrawals ("Delirium Tremens"), opioid withdrawals are not lethal. Withdrawals are the physical manifestation of the body getting rid of the excess receptors.  Expected Symptoms Early symptoms of withdrawal may include: . Agitation . Anxiety . Muscle aches . Increased tearing . Insomnia . Runny nose . Sweating . Yawning  Late symptoms of withdrawal may include: . Abdominal cramping . Diarrhea . Dilated pupils . Goose bumps . Nausea . Vomiting  Will I experience withdrawals? Due to the slow nature of the taper, it is very unlikely that you will experience any.  What is a slow taper? Taper: refers to the gradual decrease in dose.  (Last update: 03/14/2020) ____________________________________________________________________________________________    Acetaminophen; Oxycodone tablets What is this medicine? ACETAMINOPHEN; OXYCODONE (a set a MEE noe fen; ox i KOE done) is a pain reliever. It is used to treat moderate to severe pain. This medicine may be used for other purposes; ask your health care provider or pharmacist if you have questions. COMMON BRAND NAME(S): Endocet, Magnacet, Nalocet, Narvox, Percocet, Perloxx, Primalev, Primlev,  Prolate, Roxicet, Xolox What should I tell my health care provider before I take this  medicine? They need to know if you have any of these conditions:  brain tumor  drug abuse or addiction  head injury  heart disease  if you often drink alcohol   kidney disease   liver disease  low adrenal gland function  lung disease, asthma, or breathing problem  seizures  stomach or intestine problems  taken an MAOI like Marplan, Nardil, or Parnate in the last 14 days  an unusual or allergic reaction to acetaminophen, oxycodone, other medicines, foods, dyes, or preservative  pregnant or trying to get pregnant  breast-feeding How should I use this medicine? Take this medicine by mouth with a full glass of water. Take it as directed on the label. You can take it with or without food. If it upsets your stomach, take it with food. Do not use it more often than directed. There may be unused or extra doses in the bottle after you finish your treatment. Talk to your health care provider if you have questions about your dose. A special MedGuide will be given to you by the pharmacist with each prescription and refill. Be sure to read this information carefully each time. Talk to your health care provider about the use of this medicine in children. Special care may be needed. Patients over 11 years of age may have a stronger reaction and need a smaller dose. Overdosage: If you think you have taken too much of this medicine contact a poison control center or emergency room at once. NOTE: This medicine is only for you. Do not share this medicine with others. What if I miss a dose? This does not apply. This medicine is not for regular use. It should only be used as needed. What may interact with this medicine? This medicine may interact with the following medications:  alcohol  antihistamines for allergy, cough and cold  antiviral medicines for HIV or AIDS  atropine  certain antibiotics  like clarithromycin, erythromycin, linezolid, rifampin  certain medicines for anxiety or sleep  certain medicines for bladder problems like oxybutynin, tolterodine  certain medicines for depression like amitriptyline, fluoxetine, sertraline  certain medicines for fungal infections like ketoconazole, itraconazole, voriconazole  certain medicines for migraine headache like almotriptan, eletriptan, frovatriptan, naratriptan, rizatriptan, sumatriptan, zolmitriptan  certain medicines for nausea or vomiting like dolasetron, ondansetron, palonosetron  certain medicines for Parkinson's disease like benztropine, trihexyphenidyl  certain medicines for seizures like phenobarbital, phenytoin, primidone  certain medicines for stomach problems like dicyclomine, hyoscyamine  certain medicines for travel sickness like scopolamine  diuretics  general anesthetics like halothane, isoflurane, methoxyflurane, propofol  ipratropium  local anesthetics like lidocaine, pramoxine, tetracaine  MAOIs like Carbex, Eldepryl, Marplan, Nardil, and Parnate  medicines that relax muscles for surgery  methylene blue  nilotinib  other medicines with acetaminophen  other narcotic medicines for pain or cough  phenothiazines like chlorpromazine, mesoridazine, prochlorperazine, thioridazine This list may not describe all possible interactions. Give your health care provider a list of all the medicines, herbs, non-prescription drugs, or dietary supplements you use. Also tell them if you smoke, drink alcohol, or use illegal drugs. Some items may interact with your medicine. What should I watch for while using this medicine? Tell your health care provider if your pain does not go away, if it gets worse, or if you have new or a different type of pain. You may develop tolerance to this drug. Tolerance means that you will need a higher dose of the drug for pain relief. Tolerance is normal and is expected  if you take  this drug for a long time. There are different types of narcotic drugs (opioids) for pain. If you take more than one type at the same time, you may have more side effects. Give your health care provider a list of all drugs you use. He or she will tell you how much drug to take. Do not take more drug than directed. Get emergency help right away if you have problems breathing. Do not suddenly stop taking your drug because you may develop a severe reaction. Your body becomes used to the drug. This does NOT mean you are addicted. Addiction is a behavior related to getting and using a drug for a nonmedical reason. If you have pain, you have a medical reason to take pain drug. Your health care provider will tell you how much drug to take. If your health care provider wants you to stop the drug, the dose will be slowly lowered over time to avoid any side effects. Talk to your health care provider about naloxone and how to get it. Naloxone is an emergency drug used for an opioid overdose. An overdose can happen if you take too much opioid. It can also happen if an opioid is taken with some other drugs or substances, like alcohol. Know the symptoms of an overdose, like trouble breathing, unusually tired or sleepy, or not being able to respond or wake up. Make sure to tell caregivers and close contacts where it is stored. Make sure they know how to use it. After naloxone is given, you must get emergency help right away. Naloxone is a temporary treatment. Repeat doses may be needed. Do not take other drugs that contain acetaminophen with this drug. Many non-prescription drugs contain acetaminophen. Always read labels carefully. If you have questions, ask your health care provider. If you take too much acetaminophen, get medical help right away. Too much acetaminophen can be very dangerous and cause liver damage. Even if you do not have symptoms, it is important to get help right away. This drug does not prevent a heart  attack or stroke. This drug may increase the chance of a heart attack or stroke. The chance may increase the longer you use this drug or if you have heart disease. If you take aspirin to prevent a heart attack or stroke, talk to your health care provider about using this drug. You may get drowsy or dizzy. Do not drive, use machinery, or do anything that needs mental alertness until you know how this drug affects you. Do not stand up or sit up quickly, especially if you are an older patient. This reduces the risk of dizzy or fainting spells. Alcohol may interfere with the effect of this drug. Avoid alcoholic drinks. This drug will cause constipation. If you do not have a bowel movement for 3 days, call your health care provider. Your mouth may get dry. Chewing sugarless gum or sucking hard candy and drinking plenty of water may help. Contact your health care provider if the problem does not go away or is severe. What side effects may I notice from receiving this medicine? Side effects that you should report to your doctor or health care professional as soon as possible:  allergic reactions (skin rash, itching or hives; swelling of the face, lips, or tongue)  confusion  kidney injury (trouble passing urine or change in the amount of urine)  light-colored stool  liver injury (dark yellow or brown urine; general ill feeling or flu-like symptoms; loss  of appetite, right upper belly pain; unusually weak or tired, yellowing of the eyes or skin)  low adrenal gland function (nausea; vomiting; loss of appetite; unusually weak or tired; dizziness; low blood pressure)  low blood pressure (dizziness; feeling faint or lightheaded, falls; unusually weak or tired)  redness, blistering, peeling, or loosening of the skin, including inside the mouth  serotonin syndrome (irritable; confusion; diarrhea; fast or irregular heartbeat; muscle twitching; stiff muscles; trouble walking; sweating; high fever; seizures;  chills; vomiting)  trouble breathing Side effects that usually do not require medical attention (report to your doctor or health care professional if they continue or are bothersome):  constipation  dry mouth  nausea, vomiting  tiredness This list may not describe all possible side effects. Call your doctor for medical advice about side effects. You may report side effects to FDA at 1-800-FDA-1088. Where should I keep my medicine? Keep out of the reach of children and pets. This medicine can be abused. Keep it in a safe place to protect it from theft. Do not share it with anyone. It is only for you. Selling or giving away this medicine is dangerous and against the law. Store at room temperature between 20 and 25 degrees C (68 and 77 degrees F). Protect from light. Get rid of any unused medicine after the expiration date. This medicine may cause harm and death if it is taken by other adults, children, or pets. It is important to get rid of the medicine as soon as you no longer need it or it is expired. You can do this in two ways:  Take the medicine to a medicine take-back program. Check with your pharmacy or law enforcement to find a location.  If you cannot return the medicine, flush it down the toilet. NOTE: This sheet is a summary. It may not cover all possible information. If you have questions about this medicine, talk to your doctor, pharmacist, or health care provider.  2021 Elsevier/Gold Standard (2020-05-09 11:12:15)

## 2020-09-05 ENCOUNTER — Telehealth: Payer: Self-pay

## 2020-09-05 ENCOUNTER — Telehealth: Payer: Self-pay | Admitting: Pain Medicine

## 2020-09-05 NOTE — Telephone Encounter (Signed)
Called patient and she states its for the Percocet. Informed her that PA was sent yesterday and she needed to wait for Medicaid to approve it. I also informed her she had appt on 09/17/20 and she could discuss procedures then with Dr Lowella Dandy

## 2020-09-05 NOTE — Telephone Encounter (Signed)
Talked with patient about this and PA. Patient to discuss procedure with MD opn 09/17/20

## 2020-09-05 NOTE — Progress Notes (Signed)
After the patient had left, I finally received the referral from Dr. Nelly Laurence office.  It reads: "Reason for referral: Consideration of morphine pump for chronic pain.  Dr. Lacinda Axon will place the permanent pump if the trial is successful."  After having completed my evaluation, the concern that I have is that the patient does not appear to have had any type of surgical interventions in the lumbar spine.  I am a little concerned with a trend that we have observed where some insurance companies have paid for the device to be implanted however, when it comes to pain for the refills on the device, they seem to be denying them based on the LCD's and NCD's.  I am currently looking further into this to determine what the issue is and whether or not this will become the new norm.  It really makes no sense and that is why I have to investigate this a little further.

## 2020-09-05 NOTE — Telephone Encounter (Signed)
Patient lvmail stating her medications need prior auth and she would like to get some injections in her back until insurance approves the pump trial. Please call patient and let her know status

## 2020-09-05 NOTE — Telephone Encounter (Signed)
She called and said the pharmacy is waiting on prior auth for her medicine. She said if medicaid denied the auth for the medicine she wants to get the shots in her back that she used to get from Dr. Primus Bravo.

## 2020-09-13 NOTE — Progress Notes (Signed)
PROVIDER NOTE: Information contained herein reflects review and annotations entered in association with encounter. Interpretation of such information and data should be left to medically-trained personnel. Information provided to patient can be located elsewhere in the medical record under "Patient Instructions". Document created using STT-dictation technology, any transcriptional errors that may result from process are unintentional.    Patient: Claire Mcdonald  Service Category: E/M  Provider: [name], MD  DOB: [birth year]  DOS: 09/17/2020  Specialty: Interventional Pain Management  MRN: [id number]  Setting: Ambulatory outpatient  PCP: [facility]  Type: Established Patient    Referring Provider: [facility]  Location: Office  Delivery: Face-to-face     HPI  [name], a 55 y.o. year old female, is here today because of her Chronic pain syndrome [G89.4]. Claire Mcdonald primary complain today is Back Pain Last encounter: My last encounter with her was on 09/05/2020. 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Claire Mcdonald); Chronic low back pain (Primary Area of Pain) (Bilateral) (R>L); Chronic lower extremity pain (Secondary Area of Pain) (Bilateral) (R>L); Chronic pain syndrome; Grade 2-3 (13-16 mm) Anterolisthesis of L5 over S1; Lumbar foraminal stenosis (L5-S1) (Bilateral); Chronic lumbar radicular pain (S1) (Bilateral); Chronic musculoskeletal pain; Neurogenic pain; Spinal instability of lumbosacral region (L5-S1); Lumbosacral radiculopathy at S1; Abdominal pain; Female pelvic pain; Lower extremity edema; Lymphedema of both lower extremities; Pain; Pain in both lower extremities; Restless leg syndrome; Chronic back pain; Lumbar facet syndrome (Bilateral)  (R>L); Spondylosis without myelopathy or radiculopathy, lumbosacral region; Other headache syndrome; Pars defect of lumbar spine; Myalgia; Pain in joint, lower leg; Chronic pain of right knee; Failed back surgical syndrome; Abdominal mass; Fracture of femoral neck, right, closed ([place]); Localized edema; Osteoarthritis of both knees; S/P ORIF (open reduction internal fixation) fracture; Neck pain; and Bilateral hip pain on their pertinent problem list. Pain Assessment: Severity of Chronic pain is reported as a 8 /10. Location: Back Left,Right/pain radiaties down to her feet, right side is the worse. Onset: More than a month ago. Quality: Aching,Throbbing. Timing: Constant. Modifying factor(s): meds, ice. Vitals:  height is '5\' 2"'  (1.575 m) and weight is 190 lb (86.2 kg). Her temperature is 97.2 F (36.2 C) (abnormal). Her blood pressure is 116/91 (abnormal) and her pulse is 75. Her oxygen saturation is 98%.   Reason for encounter: medication management.  (Oxycodone trial: Oxycodone/APAP 5/325, 1 tab p.o. every 6 hours) the prescription was sent to the pharmacy on 09/03/2020.  However, the PMP indicates that the patient did not have the medication filled.  The last opioid analgesic recorded was oxycodone/APAP 5/325 # 28 pills written by Claire Mcdonald on 03/01/2020.  The patient had previously indicated being interested in some palliative lumbar facet blocks.  Today I spoke to the patient about prescription that I provided her for the oxycodone (Percocet).  She showed me a letter from [facility] indicating that they would not be paying for the pain medicines.  Today the patient indicated having a recurrence of her low back pain and she also specifically indicated being interested in having the lumbar facet blocks repeated since they do provide her with good relief of the pain.  Today's exam was positive for bilateral lumbar facet arthralgia.  We will go ahead and schedule the patient for a bilateral lumbar facet  block, as soon as available.  The patient is on Xarelto-we will need to stop it for 3 days prior to the procedure.  Pharmacotherapy Assessment   Analgesic: No opioid analgesics prescribed by our practice.  The patient received a letter from [facility] indicating that they would not be paying for her oxycodone prescriptions.   Monitoring: [place] PMP: PDMP reviewed during this encounter.       Pharmacotherapy: No side-effects or adverse reactions reported. Compliance: No problems identified. Effectiveness: Clinically acceptable.  No notes on file  UDS: No results found for: SUMMARY   ROS  Constitutional: Denies any fever or chills Gastrointestinal: No reported hemesis, hematochezia, vomiting, or acute GI distress Musculoskeletal: Denies any acute onset joint swelling, redness, loss of ROM, or weakness Neurological: No reported episodes of acute onset apraxia, aphasia, dysarthria, agnosia, amnesia, paralysis, loss of coordination, or loss of consciousness  Medication Review  GNP Calcium 1200, Magnesium, Vitamin D3, acetaminophen, albuterol, atorvastatin, azelastine, cetirizine, famotidine, fluticasone, furosemide, gabapentin, hydrOXYzine, methocarbamol, metoprolol tartrate, polyethylene glycol, promethazine, rivaroxaban, senna-docusate, sertraline, topiramate, and triamcinolone  History Review  Allergy: Claire Mcdonald is allergic to tape, iodinated diagnostic agents, toradol [ketorolac tromethamine], tramadol, and zofran [ondansetron hcl]. Drug: Claire Mcdonald  reports no history of drug use. Alcohol:  reports no history of alcohol use. Tobacco:  reports that she has never smoked. She has never used smokeless tobacco. Social: Claire Mcdonald  reports that she has never smoked. She has never used smokeless tobacco. She reports that she does not drink alcohol and does not use drugs. 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 Surgical: Ms. [place]  has a past surgical history that includes Tubal ligation (1998); Ovarian cyst removal (Left); central line (03/25/2015); Hand surgery (Left); Cardiac catheterization (N/A, 03/27/2015); Cardiac catheterization (N/A, 04/11/2015); Cardiac catheterization (N/A, 07/16/2015); Colonoscopy with propofol (N/A, 10/01/2015); Esophagogastroduodenoscopy (egd) with propofol (N/A, 10/01/2015); Abdominal hysterectomy; and Hip Arthroplasty (Right, 02/27/2020). Family: family history includes Allergies in her daughter, son, and son; Asthma in her father and mother; Breast cancer in her maternal grandmother and paternal aunt; Cancer in her maternal grandmother; Depression in her mother; Diabetes in her father; Early death in her maternal grandmother and paternal grandmother; Heart disease in her son; Hyperlipidemia in her father and mother; Hypertension in her father and mother; Kidney disease in her father.  Laboratory Chemistry Profile   Renal Lab Results  Component Value Date   BUN 15 03/01/2020   CREATININE 0.68 03/01/2020   BCR 21 04/16/2015   GFRAA >60 03/01/2020   GFRNONAA >60 03/01/2020     Hepatic Lab Results  Component Value Date   AST 28 02/25/2020   ALT 29 02/25/2020   ALBUMIN 3.9 02/25/2020   ALKPHOS 125 02/25/2020   LIPASE 29 03/27/2019     Electrolytes Lab Results  Component Value Date   NA 140 03/01/2020   K 3.7 03/01/2020   CL 107 03/01/2020   CALCIUM 8.2 (L) 03/01/2020   MG 2.3 02/27/2020   PHOS 3.8 02/19/2017     Bone Lab Results  Component Value Date   VD25OH 19.3 (L) 11/30/2017     Inflammation (CRP: Acute Phase) (ESR: Chronic Phase) Lab Results  Component  Value Date   CRP <0.8 11/30/2017   ESRSEDRATE 19 11/30/2017  LATICACIDVEN 0.8 03/26/2015       Note: Above Lab results reviewed.  Recent Imaging Review  DG HIP OPERATIVE UNILAT WITH PELVIS RIGHT CLINICAL DATA:  RIGHT hip surgery, hip replacement  EXAM: OPERATIVE RIGHT HIP (WITH PELVIS IF PERFORMED) 3 VIEWS  TECHNIQUE: Fluoroscopic spot image(s) were submitted for interpretation post-operatively.  COMPARISON:  February 25, 2020  FINDINGS: Post RIGHT hip hemi arthroplasty. No complicating features. AP projection of the hip arthroplasty is submitted. Initial image shows redemonstration of sub capital fracture of the RIGHT femoral neck.  FLUOROSCOPY TIME:  8 seconds  IMPRESSION: Post RIGHT hip hemiarthroplasty following subcapital fracture without complicating features on AP projections.  Electronically Signed   By: Zetta Bills M.D.   On: 02/27/2020 13:33 Note: Reviewed        Physical Exam  General appearance: Well nourished, well developed, and well hydrated. In no apparent acute distress Mental status: Alert, oriented x 3 (person, place, & time)       Respiratory: No evidence of acute respiratory distress Eyes: PERLA Vitals: BP (!) 116/91   Pulse 75   Temp (!) 97.2 F (36.2 C)   Ht '5\' 2"'  (1.575 m)   Wt 190 lb (86.2 kg)   LMP 01/08/2018   SpO2 98%   BMI 34.75 kg/m  BMI: Estimated body mass index is 34.75 kg/m as calculated from the following:   Height as of this encounter: '5\' 2"'  (1.575 m).   Weight as of this encounter: 190 lb (86.2 kg). Ideal: Ideal body weight: 50.1 kg (110 lb 7.2 oz) Adjusted ideal body weight: 64.5 kg (142 lb 4.3 oz)  Assessment   Status Diagnosis  Controlled Worsening Worsening 1. Chronic pain syndrome   2. Lumbar facet syndrome (Bilateral) (R>L)   3. Chronic low back pain (Primary Area of Pain) (Bilateral) (R>L)   4. Lumbar facet arthropathy   5. 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 Failed back surgical  syndrome   7. Pharmacologic therapy      Updated Problems: No problems updated.  Plan of Care  Problem-specific:  No problem-specific Assessment & Plan notes found for this encounter.  [name] has a current medication list which includes the following long-term medication(s): albuterol, atorvastatin, azelastine, furosemide, metoprolol tartrate, rivaroxaban, topiramate, gnp calcium 1200, cetirizine, famotidine, and fluticasone.  Pharmacotherapy (Medications Ordered): No orders of the defined types were placed in this encounter.  Orders:  Orders Placed This Encounter  Procedures  . LUMBAR FACET(MEDIAL BRANCH NERVE BLOCK) MBNB    Standing Status:   Future    Standing Expiration Date:   10/17/2020    Scheduling Instructions:     Procedure: Lumbar facet block (AKA.: Lumbosacral medial branch nerve block)     Side: Bilateral     Level: L3-4, L4-5, & L5-S1 Facets (L2, L3, L4, L5, & S1 Medial Branch Nerves)     Sedation: Patient's choice.     Timeframe: ASAA    Order Specific Question:   Where will this procedure be performed?    Answer:   [facility]   Follow-up plan:   Return for Procedure (w/ sedation): (B) L-FCT BLK #3, (Blood Thinner Protocol).     Interventional management options: Planned, scheduled, and/or pending:       Considering:   (Prior patient of Dr. Mohammed Kindle) NOTE: Xarelto Anticoagulation. (Stop: 3 days  Restart: 6 hrs) CONTRAST ALLERGY.  Diagnosticbilateral L5 TFESI #3  (HX of not following-up after procedures) Possiblebilateral  lumbar facet RFA  Possible intrathecal trial for pump.   PRN Procedures:   Palliative bilateral lumbar facet block #3  Palliative bilateral L5 TFESI #3     Recent Visits Date Type Provider Dept  09/03/20 Office Visit Milinda Pointer, MD [facility]  Showing recent visits within past 90 days and meeting all other requirements Today's Visits Date Type Provider Dept  09/17/20 Office Visit  Milinda Pointer, MD [facility]  Showing today's visits and meeting all other requirements Future Appointments Date Type Provider Dept  09/20/20 Appointment Milinda Pointer, MD [facility]  Showing future appointments within next 90 days and meeting all other requirements  I discussed the assessment and treatment plan with the patient. The patient was provided an opportunity to ask questions and all were answered. The patient agreed with the plan and demonstrated an understanding of the instructions.  Patient advised to call back or seek an in-person evaluation if the symptoms or condition worsens.  Duration of encounter: 30 minutes.  Note by: Claire Cola, MD Date: 09/17/2020; Time: 11:03 AM

## 2020-09-17 ENCOUNTER — Ambulatory Visit: Payer: Medicaid Other | Attending: Pain Medicine | Admitting: Pain Medicine

## 2020-09-17 ENCOUNTER — Other Ambulatory Visit: Payer: Self-pay

## 2020-09-17 ENCOUNTER — Encounter: Payer: Self-pay | Admitting: Pain Medicine

## 2020-09-17 VITALS — BP 116/91 | HR 75 | Temp 97.2°F | Ht 62.0 in | Wt 190.0 lb

## 2020-09-17 DIAGNOSIS — M5441 Lumbago with sciatica, right side: Secondary | ICD-10-CM

## 2020-09-17 DIAGNOSIS — G8929 Other chronic pain: Secondary | ICD-10-CM | POA: Diagnosis present

## 2020-09-17 DIAGNOSIS — M47816 Spondylosis without myelopathy or radiculopathy, lumbar region: Secondary | ICD-10-CM

## 2020-09-17 DIAGNOSIS — M5442 Lumbago with sciatica, left side: Secondary | ICD-10-CM

## 2020-09-17 DIAGNOSIS — Z79899 Other long term (current) drug therapy: Secondary | ICD-10-CM

## 2020-09-17 DIAGNOSIS — M961 Postlaminectomy syndrome, not elsewhere classified: Secondary | ICD-10-CM

## 2020-09-17 DIAGNOSIS — M4310 Spondylolisthesis, site unspecified: Secondary | ICD-10-CM

## 2020-09-17 DIAGNOSIS — G894 Chronic pain syndrome: Secondary | ICD-10-CM | POA: Diagnosis present

## 2020-09-17 NOTE — Patient Instructions (Signed)
____________________________________________________________________________________________  Preparing for Procedure with Sedation  Procedure appointments are limited to planned procedures: . No Prescription Refills. . No disability issues will be discussed. . No medication changes will be discussed.  Instructions: . Oral Intake: Do not eat or drink anything for at least 8 hours prior to your procedure. (Exception: Blood Pressure Medication. See below.) . Transportation: Unless otherwise stated by your physician, you may drive yourself after the procedure. . Blood Pressure Medicine: Do not forget to take your blood pressure medicine with a sip of water the morning of the procedure. If your Diastolic (lower reading)is above 100 mmHg, elective cases will be cancelled/rescheduled. . Blood thinners: These will need to be stopped for procedures. Notify our staff if you are taking any blood thinners. Depending on which one you take, there will be specific instructions on how and when to stop it. . Diabetics on insulin: Notify the staff so that you can be scheduled 1st case in the morning. If your diabetes requires high dose insulin, take only  of your normal insulin dose the morning of the procedure and notify the staff that you have done so. . Preventing infections: Shower with an antibacterial soap the morning of your procedure. . Build-up your immune system: Take 1000 mg of Vitamin C with every meal (3 times a day) the day prior to your procedure. . Antibiotics: Inform the staff if you have a condition or reason that requires you to take antibiotics before dental procedures. . Pregnancy: If you are pregnant, call and cancel the procedure. . Sickness: If you have a cold, fever, or any active infections, call and cancel the procedure. . Arrival: You must be in the facility at least 30 minutes prior to your scheduled procedure. . Children: Do not bring children with you. . Dress appropriately:  Bring dark clothing that you would not mind if they get stained. . Valuables: Do not bring any jewelry or valuables.  Reasons to call and reschedule or cancel your procedure: (Following these recommendations will minimize the risk of a serious complication.) . Surgeries: Avoid having procedures within 2 weeks of any surgery. (Avoid for 2 weeks before or after any surgery). . Flu Shots: Avoid having procedures within 2 weeks of a flu shots or . (Avoid for 2 weeks before or after immunizations). . Barium: Avoid having a procedure within 7-10 days after having had a radiological study involving the use of radiological contrast. (Myelograms, Barium swallow or enema study). . Heart attacks: Avoid any elective procedures or surgeries for the initial 6 months after a "Myocardial Infarction" (Heart Attack). . Blood thinners: It is imperative that you stop these medications before procedures. Let us know if you if you take any blood thinner.  . Infection: Avoid procedures during or within two weeks of an infection (including chest colds or gastrointestinal problems). Symptoms associated with infections include: Localized redness, fever, chills, night sweats or profuse sweating, burning sensation when voiding, cough, congestion, stuffiness, runny nose, sore throat, diarrhea, nausea, vomiting, cold or Flu symptoms, recent or current infections. It is specially important if the infection is over the area that we intend to treat. . Heart and lung problems: Symptoms that may suggest an active cardiopulmonary problem include: cough, chest pain, breathing difficulties or shortness of breath, dizziness, ankle swelling, uncontrolled high or unusually low blood pressure, and/or palpitations. If you are experiencing any of these symptoms, cancel your procedure and contact your primary care physician for an evaluation.  Remember:  Regular Business hours are:    Monday to Thursday 8:00 AM to 4:00 PM  Provider's  Schedule: Elizebeth Kluesner, MD:  Procedure days: Tuesday and Thursday 7:30 AM to 4:00 PM  Bilal Lateef, MD:  Procedure days: Monday and Wednesday 7:30 AM to 4:00 PM ____________________________________________________________________________________________   ____________________________________________________________________________________________  General Risks and Possible Complications  Patient Responsibilities: It is important that you read this as it is part of your informed consent. It is our duty to inform you of the risks and possible complications associated with treatments offered to you. It is your responsibility as a patient to read this and to ask questions about anything that is not clear or that you believe was not covered in this document.  Patient's Rights: You have the right to refuse treatment. You also have the right to change your mind, even after initially having agreed to have the treatment done. However, under this last option, if you wait until the last second to change your mind, you may be charged for the materials used up to that point.  Introduction: Medicine is not an exact science. Everything in Medicine, including the lack of treatment(s), carries the potential for danger, harm, or loss (which is by definition: Risk). In Medicine, a complication is a secondary problem, condition, or disease that can aggravate an already existing one. All treatments carry the risk of possible complications. The fact that a side effects or complications occurs, does not imply that the treatment was conducted incorrectly. It must be clearly understood that these can happen even when everything is done following the highest safety standards.  No treatment: You can choose not to proceed with the proposed treatment alternative. The "PRO(s)" would include: avoiding the risk of complications associated with the therapy. The "CON(s)" would include: not getting any of the treatment  benefits. These benefits fall under one of three categories: diagnostic; therapeutic; and/or palliative. Diagnostic benefits include: getting information which can ultimately lead to improvement of the disease or symptom(s). Therapeutic benefits are those associated with the successful treatment of the disease. Finally, palliative benefits are those related to the decrease of the primary symptoms, without necessarily curing the condition (example: decreasing the pain from a flare-up of a chronic condition, such as incurable terminal cancer).  General Risks and Complications: These are associated to most interventional treatments. They can occur alone, or in combination. They fall under one of the following six (6) categories: no benefit or worsening of symptoms; bleeding; infection; nerve damage; allergic reactions; and/or death. 1. No benefits or worsening of symptoms: In Medicine there are no guarantees, only probabilities. No healthcare provider can ever guarantee that a medical treatment will work, they can only state the probability that it may. Furthermore, there is always the possibility that the condition may worsen, either directly, or indirectly, as a consequence of the treatment. 2. Bleeding: This is more common if the patient is taking a blood thinner, either prescription or over the counter (example: Goody Powders, Fish oil, Aspirin, Garlic, etc.), or if suffering a condition associated with impaired coagulation (example: Hemophilia, cirrhosis of the liver, low platelet counts, etc.). However, even if you do not have one on these, it can still happen. If you have any of these conditions, or take one of these drugs, make sure to notify your treating physician. 3. Infection: This is more common in patients with a compromised immune system, either due to disease (example: diabetes, cancer, human immunodeficiency virus [HIV], etc.), or due to medications or treatments (example: therapies used to treat  cancer and   rheumatological diseases). However, even if you do not have one on these, it can still happen. If you have any of these conditions, or take one of these drugs, make sure to notify your treating physician. 4. Nerve Damage: This is more common when the treatment is an invasive one, but it can also happen with the use of medications, such as those used in the treatment of cancer. The damage can occur to small secondary nerves, or to large primary ones, such as those in the spinal cord and brain. This damage may be temporary or permanent and it may lead to impairments that can range from temporary numbness to permanent paralysis and/or brain death. 5. Allergic Reactions: Any time a substance or material comes in contact with our body, there is the possibility of an allergic reaction. These can range from a mild skin rash (contact dermatitis) to a severe systemic reaction (anaphylactic reaction), which can result in death. 6. Death: In general, any medical intervention can result in death, most of the time due to an unforeseen complication. ____________________________________________________________________________________________  ____________________________________________________________________________________________  Blood Thinners  IMPORTANT NOTICE:  If you take any of these, make sure to notify the nursing staff.  Failure to do so may result in injury.  Recommended time intervals to stop and restart blood-thinners, before & after invasive procedures  Generic Name Brand Name Stop Time. Must be stopped at least this long before procedures. After procedures, wait at least this long before re-starting.  Abciximab Reopro 15 days 2 hrs  Alteplase Activase 10 days 10 days  Anagrelide Agrylin    Apixaban Eliquis 3 days 6 hrs  Cilostazol Pletal 3 days 5 hrs  Clopidogrel Plavix 7-10 days 2 hrs  Dabigatran Pradaxa 5 days 6 hrs  Dalteparin Fragmin 24 hours 4 hrs  Dipyridamole Aggrenox  11days 2 hrs  Edoxaban Lixiana; Savaysa 3 days 2 hrs  Enoxaparin  Lovenox 24 hours 4 hrs  Eptifibatide Integrillin 8 hours 2 hrs  Fondaparinux  Arixtra 72 hours 12 hrs  Prasugrel Effient 7-10 days 6 hrs  Reteplase Retavase 10 days 10 days  Rivaroxaban Xarelto 3 days 6 hrs  Ticagrelor Brilinta 5-7 days 6 hrs  Ticlopidine Ticlid 10-14 days 2 hrs  Tinzaparin Innohep 24 hours 4 hrs  Tirofiban Aggrastat 8 hours 2 hrs  Warfarin Coumadin 5 days 2 hrs   Other medications with blood-thinning effects  Product indications Generic (Brand) names Note  Cholesterol Lipitor Stop 4 days before procedure  Blood thinner (injectable) Heparin (LMW or LMWH Heparin) Stop 24 hours before procedure  Cancer Ibrutinib (Imbruvica) Stop 7 days before procedure  Malaria/Rheumatoid Hydroxychloroquine (Plaquenil) Stop 11 days before procedure  Thrombolytics  10 days before or after procedures   Over-the-counter (OTC) Products with blood-thinning effects  Product Common names Stop Time  Aspirin > 325 mg Goody Powders, Excedrin, etc. 11 days  Aspirin ? 81 mg  7 days  Fish oil  4 days  Garlic supplements  7 days  Ginkgo biloba  36 hours  Ginseng  24 hours  NSAIDs Ibuprofen, Naprosyn, etc. 3 days  Vitamin E  4 days   ____________________________________________________________________________________________   

## 2020-09-20 ENCOUNTER — Ambulatory Visit
Admission: RE | Admit: 2020-09-20 | Discharge: 2020-09-20 | Disposition: A | Discharge status: Home / Self Care | Payer: Medicaid Other | Source: Ambulatory Visit | Attending: Pain Medicine | Admitting: Pain Medicine

## 2020-09-20 ENCOUNTER — Other Ambulatory Visit: Payer: Self-pay

## 2020-09-20 ENCOUNTER — Ambulatory Visit: Payer: Medicaid Other | Admitting: Pain Medicine

## 2020-09-20 ENCOUNTER — Encounter: Payer: Self-pay | Admitting: Pain Medicine

## 2020-09-20 VITALS — BP 106/93 | HR 58 | Temp 97.4°F | Resp 16 | Ht 62.0 in | Wt 189.0 lb

## 2020-09-20 DIAGNOSIS — M5136 Other intervertebral disc degeneration, lumbar region: Secondary | ICD-10-CM | POA: Insufficient documentation

## 2020-09-20 DIAGNOSIS — Z7901 Long term (current) use of anticoagulants: Secondary | ICD-10-CM | POA: Diagnosis present

## 2020-09-20 DIAGNOSIS — M4310 Spondylolisthesis, site unspecified: Secondary | ICD-10-CM | POA: Insufficient documentation

## 2020-09-20 DIAGNOSIS — M47817 Spondylosis without myelopathy or radiculopathy, lumbosacral region: Secondary | ICD-10-CM | POA: Insufficient documentation

## 2020-09-20 DIAGNOSIS — M51369 Other intervertebral disc degeneration, lumbar region without mention of lumbar back pain or lower extremity pain: Secondary | ICD-10-CM

## 2020-09-20 DIAGNOSIS — Z91041 Radiographic dye allergy status: Secondary | ICD-10-CM

## 2020-09-20 DIAGNOSIS — M532X7 Spinal instabilities, lumbosacral region: Secondary | ICD-10-CM

## 2020-09-20 DIAGNOSIS — M47816 Spondylosis without myelopathy or radiculopathy, lumbar region: Secondary | ICD-10-CM | POA: Diagnosis not present

## 2020-09-20 DIAGNOSIS — M545 Low back pain, unspecified: Secondary | ICD-10-CM | POA: Diagnosis present

## 2020-09-20 DIAGNOSIS — Z888 Allergy status to other drugs, medicaments and biological substances status: Secondary | ICD-10-CM | POA: Insufficient documentation

## 2020-09-20 DIAGNOSIS — G8929 Other chronic pain: Secondary | ICD-10-CM | POA: Insufficient documentation

## 2020-09-20 MED ORDER — ROPIVACAINE HCL 2 MG/ML IJ SOLN
18.0000 mL | Freq: Once | INTRAMUSCULAR | Status: AC
  Administered 2020-09-20: 18 mL via PERINEURAL

## 2020-09-20 MED ORDER — TRIAMCINOLONE ACETONIDE 40 MG/ML IJ SUSP
80.0000 mg | Freq: Once | INTRAMUSCULAR | Status: AC
  Administered 2020-09-20: 80 mg

## 2020-09-20 MED ORDER — TRIAMCINOLONE ACETONIDE 40 MG/ML IJ SUSP
INTRAMUSCULAR | Status: AC
  Filled 2020-09-20: qty 2

## 2020-09-20 MED ORDER — ROPIVACAINE HCL 2 MG/ML IJ SOLN
INTRAMUSCULAR | Status: AC
  Filled 2020-09-20: qty 20

## 2020-09-20 MED ORDER — LIDOCAINE HCL 2 % IJ SOLN
20.0000 mL | Freq: Once | INTRAMUSCULAR | Status: AC
  Administered 2020-09-20: 200 mg
  Filled 2020-09-20: qty 20

## 2020-09-20 MED ORDER — LIDOCAINE HCL (PF) 2 % IJ SOLN
INTRAMUSCULAR | Status: AC
  Filled 2020-09-20: qty 10

## 2020-09-20 MED ORDER — FENTANYL CITRATE (PF) 100 MCG/2ML IJ SOLN
INTRAMUSCULAR | Status: AC
  Filled 2020-09-20: qty 2

## 2020-09-20 MED ORDER — FENTANYL CITRATE (PF) 100 MCG/2ML IJ SOLN
25.0000 ug | INTRAMUSCULAR | Status: DC | PRN
  Administered 2020-09-20: 50 ug via INTRAVENOUS

## 2020-09-20 MED ORDER — MIDAZOLAM HCL 5 MG/5ML IJ SOLN
1.0000 mg | INTRAMUSCULAR | Status: DC | PRN
  Administered 2020-09-20 (×2): 2 mg via INTRAVENOUS

## 2020-09-20 MED ORDER — LACTATED RINGERS IV SOLN
1000.0000 mL | Freq: Once | INTRAVENOUS | Status: AC
  Administered 2020-09-20: 1000 mL via INTRAVENOUS

## 2020-09-20 MED ORDER — MIDAZOLAM HCL 5 MG/5ML IJ SOLN
INTRAMUSCULAR | Status: AC
  Filled 2020-09-20: qty 5

## 2020-09-20 NOTE — Patient Instructions (Addendum)

## 2020-09-20 NOTE — Progress Notes (Signed)
Safety precautions to be maintained throughout the outpatient stay will include: orient to surroundings, keep bed in low position, maintain call bell within reach at all times, provide assistance with transfer out of bed and ambulation.  

## 2020-09-20 NOTE — Progress Notes (Signed)
PROVIDER NOTE: Information contained herein reflects review and annotations entered in association with encounter. Interpretation of such information and data should be left to medically-trained personnel. Information provided to patient can be located elsewhere in the medical record under "Patient Instructions". Document created using STT-dictation technology, any transcriptional errors that may result from process are unintentional.    Patient: Claire Mcdonald  Service Category: Procedure  Provider: [name], MD  DOB: 08-Sep-1965  DOS: 09/20/2020  Location: [facility]  MRN: [id number]  Setting: Ambulatory - outpatient  Referring Provider: [facility]  Type: Established Patient  Specialty: Interventional Pain Management  PCP: [facility], [facility]   Primary Reason for Visit: Interventional Pain Management Treatment. CC: Back Pain (Low, worse on the right)  Procedure:          Anesthesia, Analgesia, Anxiolysis:  Type: Lumbar Facet, Medial Branch Block(s) #3  Primary Purpose: Diagnostic Region: Posterolateral Lumbosacral Spine Level: L2, L3, L4, L5, & S1 Medial Branch Level(s). Injecting these levels blocks the L3-4, L4-5, and L5-S1 lumbar facet joints. Laterality: Bilateral  Type: Moderate (Conscious) Sedation combined with Local Anesthesia Indication(s): Analgesia and Anxiety Route: Intravenous (IV) IV Access: Secured Sedation: Meaningful verbal contact was maintained at all times during the procedure  Local Anesthetic: Lidocaine 1-2%  Position: Prone   Indications: 1. Lumbar facet syndrome (Bilateral) (R>L)   2. Lumbar facet arthropathy   3. Spondylosis without myelopathy or radiculopathy, lumbosacral region   4. Spinal instability of lumbosacral region (L5-S1)   5. 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 DDD (degenerative disc disease), lumbar   7. Chronic low back pain (Bilateral) w/o sciatica    Chronic anticoagulation (Xarelto)     History of Allergy to iodine    History of allergy to radiographic contrast media    Pain Score: Pre-procedure: 8 /10 Post-procedure: 0-No pain/10   Pre-op H&P Assessment:  Claire Mcdonald is a 55 y.o. (year old), female patient, seen today for interventional treatment. She  has a past surgical history that includes Tubal ligation (1998); Ovarian cyst removal (Left); central line (03/25/2015); Hand surgery (Left); Cardiac catheterization (N/A, 03/27/2015); Cardiac catheterization (N/A, 04/11/2015); Cardiac catheterization (N/A, 07/16/2015); Colonoscopy with propofol (N/A, 10/01/2015); Esophagogastroduodenoscopy (egd) with propofol (N/A, 10/01/2015); Abdominal hysterectomy; and Hip Arthroplasty (Right, 02/27/2020). Claire Mcdonald has a current medication list which includes the following prescription(s): acetaminophen, albuterol, atorvastatin, azelastine, gnp calcium 1200, cetirizine, vitamin d3, fluticasone, gabapentin, hydroxyzine, magnesium, methocarbamol, metoprolol tartrate, polyethylene glycol, promethazine, rivaroxaban, senna-docusate, sertraline, topiramate, triamcinolone, famotidine, and furosemide, and the following Facility-Administered Medications: fentanyl and midazolam. Her primarily concern today is the Back Pain (Low, worse on the right)  Initial Vital Signs:  Pulse/HCG Rate: 70  Temp: (!) 97.2 F (36.2 C) Resp: 18 BP: (!) 144/93 SpO2: 95 %  BMI: Estimated body mass index is 34.57 kg/m as calculated from the following:   Height as of this encounter: 5\' 2"  (1.575 m).   Weight as of this encounter: 189 lb (85.7 kg).  Risk Assessment: Allergies: Reviewed. She is allergic to tape, iodinated diagnostic agents, toradol [ketorolac tromethamine], tramadol, and zofran [ondansetron hcl].  Allergy Precautions: None required Coagulopathies: Reviewed. None identified.  Blood-thinner therapy: None at this time Active Infection(s): Reviewed. None identified. Claire Mcdonald is afebrile  Site Confirmation: Ms.  Mcdonald was asked to confirm the procedure and laterality before marking the site Procedure checklist: Completed Consent: Before the procedure and under the influence of no sedative(s), amnesic(s), or anxiolytics, the patient  was informed of the treatment options, risks and possible complications. To fulfill our ethical and legal obligations, as recommended by the American Medical Association's Code of Ethics, I have informed the patient of my clinical impression; the nature and purpose of the treatment or procedure; the risks, benefits, and possible complications of the intervention; the alternatives, including doing nothing; the risk(s) and benefit(s) of the alternative treatment(s) or procedure(s); and the risk(s) and benefit(s) of doing nothing. The patient was provided information about the general risks and possible complications associated with the procedure. These may include, but are not limited to: failure to achieve desired goals, infection, bleeding, organ or nerve damage, allergic reactions, paralysis, and death. In addition, the patient was informed of those risks and complications associated to Spine-related procedures, such as failure to decrease pain; infection (i.e.: Meningitis, epidural or intraspinal abscess); bleeding (i.e.: epidural hematoma, subarachnoid hemorrhage, or any other type of intraspinal or peri-dural bleeding); organ or nerve damage (i.e.: Any type of peripheral nerve, nerve root, or spinal cord injury) with subsequent damage to sensory, motor, and/or autonomic systems, resulting in permanent pain, numbness, and/or weakness of one or several areas of the body; allergic reactions; (i.e.: anaphylactic reaction); and/or death. Furthermore, the patient was informed of those risks and complications associated with the medications. These include, but are not limited to: allergic reactions (i.e.: anaphylactic or anaphylactoid reaction(s)); adrenal axis suppression; blood sugar elevation  that in diabetics may result in ketoacidosis or comma; water retention that in patients with history of congestive heart failure may result in shortness of breath, pulmonary edema, and decompensation with resultant heart failure; weight gain; swelling or edema; medication-induced neural toxicity; particulate matter embolism and blood vessel occlusion with resultant organ, and/or nervous system infarction; and/or aseptic necrosis of one or more joints. Finally, the patient was informed that Medicine is not an exact science; therefore, there is also the possibility of unforeseen or unpredictable risks and/or possible complications that may result in a catastrophic outcome. The patient indicated having understood very clearly. We have given the patient no guarantees and we have made no promises. Enough time was given to the patient to ask questions, all of which were answered to the patient's satisfaction. Ms. Rotondo has indicated that she wanted to continue with the procedure. Attestation: I, the ordering provider, attest that I have discussed with the patient the benefits, risks, side-effects, alternatives, likelihood of achieving goals, and potential problems during recovery for the procedure that I have provided informed consent. Date  Time: 09/20/2020  7:59 AM  Pre-Procedure Preparation:  Monitoring: As per clinic protocol. Respiration, ETCO2, SpO2, BP, heart rate and rhythm monitor placed and checked for adequate function Safety Precautions: Patient was assessed for positional comfort and pressure points before starting the procedure. Time-out: I initiated and conducted the "Time-out" before starting the procedure, as per protocol. The patient was asked to participate by confirming the accuracy of the "Time Out" information. Verification of the correct person, site, and procedure were performed and confirmed by me, the nursing staff, and the patient. "Time-out" conducted as per Joint Commission's Universal  Protocol (UP.01.01.01). Time: 6314  Description of Procedure:          Laterality: Bilateral. The procedure was performed in identical fashion on both sides. Levels:  L2, L3, L4, L5, & S1 Medial Branch Level(s) Area Prepped: Posterior Lumbosacral Region DuraPrep (Iodine Povacrylex [0.7% available iodine] and Isopropyl Alcohol, 74% w/w) Safety Precautions: Aspiration looking for blood return was conducted prior to all injections. At no  point did we inject any substances, as a needle was being advanced. Before injecting, the patient was told to immediately notify me if she was experiencing any new onset of "ringing in the ears, or metallic taste in the mouth". No attempts were made at seeking any paresthesias. Safe injection practices and needle disposal techniques used. Medications properly checked for expiration dates. SDV (single dose vial) medications used. After the completion of the procedure, all disposable equipment used was discarded in the proper designated medical waste containers. Local Anesthesia: Protocol guidelines were followed. The patient was positioned over the fluoroscopy table. The area was prepped in the usual manner. The time-out was completed. The target area was identified using fluoroscopy. A 12-in long, straight, sterile hemostat was used with fluoroscopic guidance to locate the targets for each level blocked. Once located, the skin was marked with an approved surgical skin marker. Once all sites were marked, the skin (epidermis, dermis, and hypodermis), as well as deeper tissues (fat, connective tissue and muscle) were infiltrated with a small amount of a short-acting local anesthetic, loaded on a 10cc syringe with a 25G, 1.5-in  Needle. An appropriate amount of time was allowed for local anesthetics to take effect before proceeding to the next step. Local Anesthetic: Lidocaine 2.0% The unused portion of the local anesthetic was discarded in the proper designated  containers. Technical explanation of process:  L2 Medial Branch Nerve Block (MBB): The target area for the L2 medial branch is at the junction of the postero-lateral aspect of the superior articular process and the superior, posterior, and medial edge of the transverse process of L3. Under fluoroscopic guidance, a Quincke needle was inserted until contact was made with os over the superior postero-lateral aspect of the pedicular shadow (target area). After negative aspiration for blood, 0.5 mL of the nerve block solution was injected without difficulty or complication. The needle was removed intact. L3 Medial Branch Nerve Block (MBB): The target area for the L3 medial branch is at the junction of the postero-lateral aspect of the superior articular process and the superior, posterior, and medial edge of the transverse process of L4. Under fluoroscopic guidance, a Quincke needle was inserted until contact was made with os over the superior postero-lateral aspect of the pedicular shadow (target area). After negative aspiration for blood, 0.5 mL of the nerve block solution was injected without difficulty or complication. The needle was removed intact. L4 Medial Branch Nerve Block (MBB): The target area for the L4 medial branch is at the junction of the postero-lateral aspect of the superior articular process and the superior, posterior, and medial edge of the transverse process of L5. Under fluoroscopic guidance, a Quincke needle was inserted until contact was made with os over the superior postero-lateral aspect of the pedicular shadow (target area). After negative aspiration for blood, 0.5 mL of the nerve block solution was injected without difficulty or complication. The needle was removed intact. L5 Medial Branch Nerve Block (MBB): The target area for the L5 medial branch is at the junction of the postero-lateral aspect of the superior articular process and the superior, posterior, and medial edge of the  sacral ala. Under fluoroscopic guidance, a Quincke needle was inserted until contact was made with os over the superior postero-lateral aspect of the pedicular shadow (target area). After negative aspiration for blood, 0.5 mL of the nerve block solution was injected without difficulty or complication. The needle was removed intact. S1 Medial Branch Nerve Block (MBB): The target area for the S1  medial branch is at the posterior and inferior 6 o'clock position of the L5-S1 facet joint. Under fluoroscopic guidance, the Quincke needle inserted for the L5 MBB was redirected until contact was made with os over the inferior and postero aspect of the sacrum, at the 6 o' clock position under the L5-S1 facet joint (Target area). After negative aspiration for blood, 0.5 mL of the nerve block solution was injected without difficulty or complication. The needle was removed intact.  Nerve block solution: 0.2% PF-Ropivacaine + Triamcinolone (40 mg/mL) diluted to a final concentration of 4 mg of Triamcinolone/mL of Ropivacaine The unused portion of the solution was discarded in the proper designated containers. Procedural Needles: 22-gauge, 5-inch, Quincke needles used for all levels.  Once the entire procedure was completed, the treated area was cleaned, making sure to leave some of the prepping solution back to take advantage of its long term bactericidal properties.   Illustration of the posterior view of the lumbar spine and the posterior neural structures. Laminae of L2 through S1 are labeled. DPRL5, dorsal primary ramus of L5; DPRS1, dorsal primary ramus of S1; DPR3, dorsal primary ramus of L3; FJ, facet (zygapophyseal) joint L3-L4; I, inferior articular process of L4; LB1, lateral branch of dorsal primary ramus of L1; IAB, inferior articular branches from L3 medial branch (supplies L4-L5 facet joint); IBP, intermediate branch plexus; MB3, medial branch of dorsal primary ramus of L3; NR3, third lumbar nerve root; S,  superior articular process of L5; SAB, superior articular branches from L4 (supplies L4-5 facet joint also); TP3, transverse process of L3.  Vitals:   09/20/20 0853 09/20/20 0900 09/20/20 0910 09/20/20 0920  BP: (!) 132/100 (!) 116/51 118/84 (!) 106/93  Pulse: 64 70 (!) 59 (!) 58  Resp: 16 14 16 16   Temp:  (!) 97.4 F (36.3 C)    TempSrc:      SpO2: 99% 99% 98% 96%  Weight:      Height:         Start Time: 0834 hrs. End Time: 0852 hrs.  Imaging Guidance (Spinal):          Type of Imaging Technique: Fluoroscopy Guidance (Spinal) Indication(s): Assistance in needle guidance and placement for procedures requiring needle placement in or near specific anatomical locations not easily accessible without such assistance. Exposure Time: Please see nurses notes. Contrast: None used. Fluoroscopic Guidance: I was personally present during the use of fluoroscopy. "Tunnel Vision Technique" used to obtain the best possible view of the target area. Parallax error corrected before commencing the procedure. "Direction-depth-direction" technique used to introduce the needle under continuous pulsed fluoroscopy. Once target was reached, antero-posterior, oblique, and lateral fluoroscopic projection used confirm needle placement in all planes. Images permanently stored in EMR. Interpretation: No contrast injected. I personally interpreted the imaging intraoperatively. Adequate needle placement confirmed in multiple planes. Permanent images saved into the patient's record.  Antibiotic Prophylaxis:   Anti-infectives (From admission, onward)   None     Indication(s): None identified  Post-operative Assessment:  Post-procedure Vital Signs:  Pulse/HCG Rate: (!) 58  Temp: (!) 97.4 F (36.3 C) Resp: 16 BP: (!) 106/93 SpO2: 96 %  EBL: None  Complications: No immediate post-treatment complications observed by team, or reported by patient.  Note: The patient tolerated the entire procedure well. A  repeat set of vitals were taken after the procedure and the patient was kept under observation following institutional policy, for this type of procedure. Post-procedural neurological assessment was performed, showing return to baseline, prior  to discharge. The patient was provided with post-procedure discharge instructions, including a section on how to identify potential problems. Should any problems arise concerning this procedure, the patient was given instructions to immediately contact us, at any time, without hesitation. In any case, we plan to contact the patient by telephone for a follow-up status report regarding this interventional procedure.  Comments:  No additional relevant information.  Plan of Care  Orders:  Orders Placed This Encounter  Procedures  . LUMBAR FACET(MEDIAL BRANCH NERVE BLOCK) MBNB    Scheduling Instructions:     Procedure: Lumbar facet block (AKA.: Lumbosacral medial branch nerve block)     Side: Bilateral     Level: L3-4, L4-5, & L5-S1 Facets (L2, L3, L4, L5, & S1 Medial Branch Nerves)     Sedation: Patient's choice.     Timeframe: Today    Order Specific Question:   Where will this procedure be performed?    Answer:   [facility]  . [facility] C-ARM 1-60 MIN NO REPORT    Intraoperative interpretation by procedural physician at [place].    Standing Status:   Standing    Number of Occurrences:   1    Order Specific Question:   Reason for exam:    Answer:   Assistance in needle guidance and placement for procedures requiring needle placement in or near specific anatomical locations not easily accessible without such assistance.  . Informed Consent Details: Physician/Practitioner Attestation; Transcribe to consent form and obtain patient signature    Nursing Order: Transcribe to consent form and obtain patient signature. Note: Always confirm laterality of pain with Ms. Sloss, before procedure.    Order Specific Question:    Physician/Practitioner attestation of informed consent for procedure/surgical case    Answer:   I, the physician/practitioner, attest that I have discussed with the patient the benefits, risks, side effects, alternatives, likelihood of achieving goals and potential problems during recovery for the procedure that I have provided informed consent.    Order Specific Question:   Procedure    Answer:   Lumbar Facet Block  under fluoroscopic guidance    Order Specific Question:   Physician/Practitioner performing the procedure    Answer:   Verity Gilcrest A. Dossie Arbour MD    Order Specific Question:   Indication/Reason    Answer:   Low Back Pain, with our without leg pain, due to Facet Joint Arthralgia (Joint Pain) Spondylosis (Arthritis of the Spine), without myelopathy or radiculopathy (Nerve Damage).  . Care order/instruction: Please confirm that the patient has stopped the Xarelto (Rivaroxaban) x 3 days prior to procedure or surgery.    Please confirm that the patient has stopped the Xarelto (Rivaroxaban) x 3 days prior to procedure or surgery.    Standing Status:   Standing    Number of Occurrences:   1  . Provide equipment / supplies at bedside    "Block Tray" (Disposable  single use) Needle type: SpinalSpinal Amount/quantity: 4 Size: Medium (5-inch) Gauge: 22G    Standing Status:   Standing    Number of Occurrences:   1    Order Specific Question:   Specify    Answer:   Block Tray  . Bleeding precautions    Standing Status:   Standing    Number of Occurrences:   1  . Miscellanous precautions    Standing Status:   Standing    Number of Occurrences:   1   Chronic Opioid Analgesic:  No opioid  analgesics prescribed by our practice.  The patient received a letter from [facility] indicating that they would not be paying for her oxycodone prescriptions.   Medications ordered for procedure: Meds ordered this encounter  Medications  . lidocaine (XYLOCAINE) 2 % (with pres) injection 400 mg  .  lactated ringers infusion 1,000 mL  . midazolam (VERSED) 5 MG/5ML injection 1-2 mg    Make sure Flumazenil is available in the pyxis when using this medication. If oversedation occurs, administer 0.2 mg IV over 15 sec. If after 45 sec no response, administer 0.2 mg again over 1 min; may repeat at 1 min intervals; not to exceed 4 doses (1 mg)  . fentaNYL (SUBLIMAZE) injection 25-50 mcg    Make sure Narcan is available in the pyxis when using this medication. In the event of respiratory depression (RR< 8/min): Titrate NARCAN (naloxone) in increments of 0.1 to 0.2 mg IV at 2-3 minute intervals, until desired degree of reversal.  . ropivacaine (PF) 2 mg/mL (0.2%) (NAROPIN) injection 18 mL  . triamcinolone acetonide (KENALOG-40) injection 80 mg   Medications administered: We administered lidocaine, lactated ringers, midazolam, fentaNYL, ropivacaine (PF) 2 mg/mL (0.2%), and triamcinolone acetonide.  See the medical record for exact dosing, route, and time of administration.  Follow-up plan:   Return in about 2 weeks (around 10/04/2020) for (F2F), (PP) Follow-up.      Interventional Therapies  Risk  Complexity Considerations:   Anticoagulation: Xarelto. (Stop: 3 days  Restart: 6 hrs)  Allergy: Iodine and radiological contrast  Implant: Internal cardiac defibrillator  History of noncompliance with post procedure follow-ups. (Prior patient of Dr. Mohammed Kindle)   Planned  Pending:   Pending further evaluation   Under consideration:   Diagnosticbilateral L5 TFESI #3  Possiblebilateral lumbar facet RFA  Possible intrathecal trial for pump.   Completed:   Therapeutic/palliative bilateral lumbar facet block x2  Therapeutic/palliativebilateral L5 TFESI x2    Therapeutic  Palliative (PRN) options:   Palliative bilateral lumbar facet block #3  Palliative bilateral L5 TFESI #3     Recent Visits Date Type Provider Dept  09/17/20 Office Visit Milinda Pointer, MD [facility]   09/03/20 Office Visit Milinda Pointer, MD [facility]  Showing recent visits within past 90 days and meeting all other requirements Today's Visits Date Type Provider Dept  09/20/20 Procedure visit Milinda Pointer, MD [facility]  Showing today's visits and meeting all other requirements Future Appointments Date Type Provider Dept  10/03/20 Appointment Milinda Pointer, MD [facility]  Showing future appointments within next 90 days and meeting all other requirements  Disposition: Discharge home  Discharge (Date  Time): 09/20/2020; 0925 hrs.   Primary Care Physician: [facility] Location: [facility] Note by: [name], MD Date: 09/20/2020; Time: 1:01 PM  Disclaimer:  Medicine is not an Chief Strategy Officer. The only guarantee in medicine is that nothing is guaranteed. It is important to note that the decision to proceed with this intervention was based on the information collected from the patient. The Data and conclusions were drawn from the patient's questionnaire, the interview, and the physical examination. Because the information was provided in large part by the patient, it cannot be guaranteed that it has not been purposely or unconsciously manipulated. Every effort has been made to obtain as much relevant data as possible for this evaluation. It is important to note that the conclusions that lead to this procedure are derived in large part from the  available data. Always take into account that the treatment will also be dependent on availability of resources and existing treatment guidelines, considered by other Pain Management Practitioners as being common knowledge and practice, at the time of the intervention. For Medico-Legal purposes, it is also important to point out that variation in procedural techniques and pharmacological choices are the acceptable norm. The indications, contraindications,  technique, and results of the above procedure should only be interpreted and judged by a Board-Certified Interventional Pain Specialist with extensive familiarity and expertise in the same exact procedure and technique.

## 2020-09-21 ENCOUNTER — Telehealth: Payer: Self-pay

## 2020-09-21 NOTE — Telephone Encounter (Signed)
Post procedure follow up.  LM 

## 2020-10-01 NOTE — Progress Notes (Signed)
PROVIDER NOTE: Information contained herein reflects review and annotations entered in association with encounter. Interpretation of such information and data should be left to medically-trained personnel. Information provided to patient can be located elsewhere in the medical record under "Patient Instructions". Document created using STT-dictation technology, any transcriptional errors that may result from process are unintentional.    Patient: Claire Mcdonald  Service Category: E/M  Provider: [name], MD  DOB: 1965/11/21  DOS: 10/03/2020  Specialty: Interventional Pain Management  MRN: [id number]  Setting: Ambulatory outpatient  PCP: [facility]  Type: Established Patient    Referring Provider: [facility]  Location: Office  Delivery: Face-to-face     HPI  [name], a 55 y.o. year old female, is here today because of her Chronic pain syndrome [G89.4]. [name] primary complain today is Back Pain Last encounter: My last encounter with her was on 09/20/2020. Pertinent problems: 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[place]); Chronic low back pain (1ry area of Pain) (Bilateral) (R>L) with sciatica (Bilateral); Chronic lower extremity pain (2ry area of Pain) (Bilateral) (R>L); Chronic pain syndrome; Grade 2-3 (13-16 mm) Anterolisthesis of L5 over S1; Lumbar foraminal stenosis (L5-S1) (Bilateral); Chronic lumbar radicular pain (S1) (Bilateral); Chronic musculoskeletal pain; Neurogenic pain; Spinal instability of lumbosacral region (L5-S1); Lumbosacral radiculopathy at S1; Abdominal pain; Female pelvic pain; Lower extremity edema; Lymphedema of both lower extremities; Restless leg syndrome; Lumbar facet syndrome (Bilateral) (R>L); Spondylosis without myelopathy or  radiculopathy, lumbosacral region; Other headache syndrome; Pars defect of lumbar spine; Myalgia; Pain in joint, lower leg; Chronic knee pain (Right); Failed back surgical syndrome; Abdominal mass; Fracture of femoral neck, right, closed ([place]); Localized edema; Osteoarthritis of both knees; S/P ORIF (open reduction internal fixation) fracture; Neck pain; Bilateral hip pain; Chronic low back pain (Bilateral) w/o sciatica; and Patellofemoral stress syndrome on their pertinent problem list. Pain Assessment: Severity of Chronic pain is reported as a 0-No pain/10. Location: Back  /Denies. Onset: More than a month ago. Quality:  . Timing: Intermittent. Modifying factor(s): procedures. Vitals:  height is '5\' 2"'  (1.575 m) and weight is 189 lb (85.7 kg). Her temperature is 97.2 F (36.2 C) (abnormal). Her blood pressure is 138/96 (abnormal) and her pulse is 78. Her oxygen saturation is 97%.   Reason for encounter: post-procedure assessment.  The patient indicates having attained 100% relief of the pain that seems to be ongoing.  She gets excellent benefit from the bilateral lumbar facet blocks.  Post-Procedure Evaluation  Procedure (09/20/2020): Diagnostic/therapeutic bilateral lumbar facet MBB #3 under fluoroscopic guidance and IV sedation Pre-procedure pain level: 8/10 Post-procedure: 0/10 (100% relief)  Sedation: Sedation provided.  Effectiveness during initial hour after procedure(Ultra-Short Term Relief): 100 %.  Local anesthetic used: Long-acting (4-6 hours) Effectiveness: Defined as any analgesic benefit obtained secondary to the administration of local anesthetics. This carries significant diagnostic value as to the etiological location, or anatomical origin, of the pain. Duration of benefit is expected to coincide with the duration of the local anesthetic used.  Effectiveness during initial 4-6 hours after procedure(Short-Term Relief): 100 %.  Long-term benefit: Defined as any relief past the  pharmacologic duration of the local anesthetics.  Effectiveness past the initial 6 hours after procedure(Long-Term Relief): 100 %.  Current benefits: Defined as benefit that persist at this time.   Analgesia:  100% ongoing relief  of the low back pain from the lumbar facet blocks. Function: [name] reports improvement in function ROM: [name] reports improvement in ROM  Pharmacotherapy Assessment   Analgesic: No opioid analgesics prescribed by our practice.  The patient received a letter from [facility] indicating that they would not be paying for her oxycodone prescriptions.   Monitoring: [place] PMP: PDMP reviewed during this encounter.       Pharmacotherapy: No side-effects or adverse reactions reported. Compliance: No problems identified. Effectiveness: Clinically acceptable.  Chauncey Fischer, RN  10/03/2020  8:38 AM  Sign when Signing Visit Safety precautions to be maintained throughout the outpatient stay will include: orient to surroundings, keep bed in low position, maintain call bell within reach at all times, provide assistance with transfer out of bed and ambulation.     UDS: No results found for: SUMMARY   ROS  Constitutional: Denies any fever or chills Gastrointestinal: No reported hemesis, hematochezia, vomiting, or acute GI distress Musculoskeletal: Denies any acute onset joint swelling, redness, loss of ROM, or weakness Neurological: No reported episodes of acute onset apraxia, aphasia, dysarthria, agnosia, amnesia, paralysis, loss of coordination, or loss of consciousness  Medication Review  GNP Calcium 1200, Magnesium, Vitamin D3, acetaminophen, albuterol, atorvastatin, azelastine, cetirizine, famotidine, fluticasone, furosemide, gabapentin, hydrOXYzine, methocarbamol, metoprolol tartrate, polyethylene glycol, promethazine, rivaroxaban, senna-docusate, sertraline, topiramate, and triamcinolone  History Review  Allergy: [name] is allergic to tape, iodinated diagnostic  agents, toradol [ketorolac tromethamine], tramadol, and zofran [ondansetron hcl]. Drug: [name]  reports no history of drug use. Alcohol:  reports no history of alcohol use. Tobacco:  reports that she has never smoked. She has never used smokeless tobacco. Social: Ms. Dohn  reports that she has never smoked. She has never used smokeless tobacco. She reports that she does not drink alcohol and does not use drugs. 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 Surgical: Ms. Guadron  has a past surgical history that includes Tubal ligation (1998); Ovarian cyst removal (Left); central line (03/25/2015); Hand surgery (Left); Cardiac catheterization (N/A, 03/27/2015); Cardiac catheterization (N/A, 04/11/2015); Cardiac catheterization (N/A, 07/16/2015); Colonoscopy with propofol (N/A, 10/01/2015); Esophagogastroduodenoscopy (egd) with propofol (N/A, 10/01/2015); Abdominal hysterectomy; and Hip Arthroplasty (Right, 02/27/2020). Family: family history includes Allergies in her daughter, son, and son; Asthma in her father and mother; Breast cancer in her maternal grandmother and paternal aunt; Cancer in her maternal grandmother; Depression in her mother; Diabetes in her father; Early death in her maternal grandmother and paternal grandmother; Heart disease in her son; Hyperlipidemia in her father and mother; Hypertension in her father and mother; Kidney disease in her father.  Laboratory Chemistry Profile   Renal Lab Results  Component Value Date   BUN 15 03/01/2020   CREATININE 0.68 03/01/2020   BCR 21 04/16/2015   GFRAA >60  03/01/2020   GFRNONAA >60 03/01/2020     Hepatic Lab Results  Component Value Date   AST 28 02/25/2020   ALT 29 02/25/2020   ALBUMIN 3.9 02/25/2020   ALKPHOS 125 02/25/2020   LIPASE 29 03/27/2019     Electrolytes Lab Results  Component Value Date   NA 140 03/01/2020   K 3.7 03/01/2020   CL 107 03/01/2020   CALCIUM 8.2 (L) 03/01/2020  MG 2.3 02/27/2020   PHOS 3.8 02/19/2017     Bone Lab Results  Component Value Date   VD25OH 19.3 (L) 11/30/2017     Inflammation (CRP: Acute Phase) (ESR: Chronic Phase) Lab Results  Component Value Date   CRP <0.8 11/30/2017   ESRSEDRATE 19 11/30/2017   LATICACIDVEN 0.8 03/26/2015       Note: Above Lab results reviewed.  Recent Imaging Review  DG PAIN CLINIC C-ARM 1-60 MIN NO REPORT Fluoro was used, but no Radiologist interpretation will be provided.  Please refer to "NOTES" tab for provider progress note. Note: Reviewed        Physical Exam  General appearance: Well nourished, well developed, and well hydrated. In no apparent acute distress Mental status: Alert, oriented x 3 (person, place, & time)       Respiratory: No evidence of acute respiratory distress Eyes: PERLA Vitals: BP (!) 138/96   Pulse 78   Temp (!) 97.2 F (36.2 C)   Ht '5\' 2"'  (1.575 m)   Wt 189 lb (85.7 kg)   LMP 01/08/2018   SpO2 97%   BMI 34.57 kg/m  BMI: Estimated body mass index is 34.57 kg/m as calculated from the following:   Height as of this encounter: '5\' 2"'  (1.575 m).   Weight as of this encounter: 189 lb (85.7 kg). Ideal: Ideal body weight: 50.1 kg (110 lb 7.2 oz) Adjusted ideal body weight: 64.4 kg (141 lb 13.9 oz)  Assessment   Status Diagnosis  Controlled Improved Stable 1. Chronic pain syndrome   2. Lumbar facet syndrome (Bilateral) (R>L)   3. Lumbar facet arthropathy   4. 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 Chronic low back pain (Bilateral) w/o sciatica      Updated Problems: Problem  Patellofemoral Stress  Syndrome    Plan of Care  Problem-specific:  No problem-specific Assessment & Plan notes found for this encounter.  Ms. KEMIA WENDEL has a current medication list which includes the following long-term medication(s): albuterol, atorvastatin, azelastine, gnp calcium 1200, cetirizine, famotidine, fluticasone, furosemide, metoprolol tartrate, rivaroxaban, and topiramate.  Pharmacotherapy (Medications Ordered): No orders of the defined types were placed in this encounter.  Orders:  No orders of the defined types were placed in this encounter.  Follow-up plan:   Return if symptoms worsen or fail to improve.      Interventional Therapies  Risk  Complexity Considerations:   Anticoagulation: Xarelto. (Stop: 3 days  Restart: 6 hrs)  Allergy: Iodine and radiological contrast  Implant: Internal cardiac defibrillator  History of noncompliance with post procedure follow-ups. (Prior patient of Dr. Mohammed Kindle)   Planned  Pending:   Pending further evaluation   Under consideration:   Diagnosticbilateral L5 TFESI #3  Possiblebilateral lumbar facet RFA  Possible intrathecal trial for pump.   Completed:   Therapeutic/palliative bilateral lumbar facet block x2  Therapeutic/palliativebilateral L5 TFESI x2    Therapeutic  Palliative (PRN) options:   Palliative bilateral lumbar facet block #3  Palliative bilateral L5 TFESI #3      Recent Visits Date Type Provider Dept  09/20/20 Procedure visit Milinda Pointer, MD [facility]  09/17/20 Office Visit Milinda Pointer, MD [facility]  09/03/20 Office Visit Milinda Pointer, MD [facility]  Showing recent visits within past 90 days and meeting all other requirements Today's Visits Date Type Provider Dept  10/03/20 Office Visit Milinda Pointer, MD [facility]  Showing today's visits and meeting all  other requirements Future Appointments No visits were found meeting these  conditions. Showing future appointments within next 90 days and meeting all other requirements  I discussed the assessment and treatment plan with the patient. The patient was provided an opportunity to ask questions and all were answered. The patient agreed with the plan and demonstrated an understanding of the instructions.  Patient advised to call back or seek an in-person evaluation if the symptoms or condition worsens.  Duration of encounter: 25 minutes.  Note by: [name], MD Date: 10/03/2020; Time: 8:47 AM

## 2020-10-03 ENCOUNTER — Ambulatory Visit: Payer: Medicaid Other | Attending: Pain Medicine | Admitting: Pain Medicine

## 2020-10-03 ENCOUNTER — Other Ambulatory Visit: Payer: Self-pay

## 2020-10-03 ENCOUNTER — Encounter: Payer: Self-pay | Admitting: Pain Medicine

## 2020-10-03 VITALS — BP 138/96 | HR 78 | Temp 97.2°F | Ht 62.0 in | Wt 189.0 lb

## 2020-10-03 DIAGNOSIS — M545 Low back pain, unspecified: Secondary | ICD-10-CM | POA: Insufficient documentation

## 2020-10-03 DIAGNOSIS — G894 Chronic pain syndrome: Secondary | ICD-10-CM | POA: Insufficient documentation

## 2020-10-03 DIAGNOSIS — G8929 Other chronic pain: Secondary | ICD-10-CM | POA: Diagnosis present

## 2020-10-03 DIAGNOSIS — M47816 Spondylosis without myelopathy or radiculopathy, lumbar region: Secondary | ICD-10-CM | POA: Diagnosis not present

## 2020-10-03 DIAGNOSIS — M222X9 Patellofemoral disorders, unspecified knee: Secondary | ICD-10-CM | POA: Insufficient documentation

## 2020-10-03 DIAGNOSIS — M4310 Spondylolisthesis, site unspecified: Secondary | ICD-10-CM

## 2020-10-03 NOTE — Progress Notes (Signed)
Safety precautions to be maintained throughout the outpatient stay will include: orient to surroundings, keep bed in low position, maintain call bell within reach at all times, provide assistance with transfer out of bed and ambulation.  

## 2020-10-31 DIAGNOSIS — Z96641 Presence of right artificial hip joint: Secondary | ICD-10-CM | POA: Insufficient documentation

## 2020-11-03 NOTE — Progress Notes (Unsigned)
Patient: Claire Mcdonald  Service Category: E/M  Provider: [name], MD  DOB: 10-27-1965  DOS: 11/07/2020  Location: Office  MRN: [id number]  Setting: Ambulatory outpatient  Referring Provider: [facility]  Type: Established Patient  Specialty: Interventional Pain Management  PCP: [facility]  Location: Remote location  Delivery: TeleHealth     Virtual Encounter - Pain Management PROVIDER NOTE: Information contained herein reflects review and annotations entered in association with encounter. Interpretation of such information and data should be left to medically-trained personnel. Information provided to patient can be located elsewhere in the medical record under "Patient Instructions". Document created using STT-dictation technology, any transcriptional errors that may result from process are unintentional.    Contact & Pharmacy Preferred: [phone] Home: [phone] (home) Mobile: [phone] (mobile) E-mail: [name] .[place], [place] - [address] [place] [address] Phone: [phone] Fax: [phone]  [facility] - [place], [place] - [address] [place] [address] Phone: [phone] Fax: [phone]   Pre-screening  Ms. Klump offered "in-person" vs "virtual" encounter. She indicated preferring virtual for this encounter.   Reason COVID-19*  Social distancing based on CDC and AMA recommendations.   I contacted SANYAH MOLNAR on 11/07/2020 via telephone.      I clearly identified myself as [name], MD. I verified that I was speaking with the correct person using two identifiers (Name: TIANNI ESCAMILLA, and date of birth: 08-06-1966).  Consent I sought verbal advanced consent from [name] for virtual visit interactions. I informed Ms. Cando of possible security and privacy concerns, risks, and limitations associated with providing "not-in-person"  medical evaluation and management services. I also informed Ms. Stormer of the availability of "in-person" appointments. Finally, I informed her that there would be a charge for the virtual visit and that she could be  personally, fully or partially, financially responsible for it. Ms. Stapleton expressed understanding and agreed to proceed.   Historic Elements   Ms. LYNDSEY DEMOS is a 55 y.o. year old, female patient evaluated today after our last contact on 10/03/2020. Ms. 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 She also  has a past surgical history that includes Tubal ligation (1998); Ovarian cyst removal (Left); central line (03/25/2015); Hand surgery (Left); Cardiac catheterization (N/A, 03/27/2015); Cardiac catheterization (N/A, 04/11/2015); Cardiac catheterization (N/A, 07/16/2015); Colonoscopy with propofol (N/A, 10/01/2015); Esophagogastroduodenoscopy (egd) with propofol (N/A, 10/01/2015); Abdominal hysterectomy; and Hip Arthroplasty (Right, 02/27/2020). Ms. Waren has a current medication list which includes the following prescription(s): acetaminophen, albuterol, atorvastatin, azelastine, gnp calcium 1200, cetirizine, vitamin d3, cholestyramine, diclofenac sodium, dicyclomine, famotidine, fluticasone, furosemide, gabapentin, hydroxyzine, magnesium, methocarbamol, metoprolol tartrate, polyethylene glycol, potassium chloride sa, promethazine, rivaroxaban, sertraline, topiramate, triamcinolone, and senna-docusate. She  reports that she has never smoked. She has never used smokeless tobacco. She reports that she does not drink alcohol  and does not use drugs. Ms. Hanger is allergic to tape, iodinated diagnostic agents, toradol [ketorolac tromethamine], tramadol, and zofran [ondansetron hcl].   HPI  Today, she is being contacted for worsening of previously known (established) problem.  The patient indicates that approximately a month ago she started having recurrence  of her low back pain.  Currently her primary pain is that of the lower back, bilaterally, with the right being worse than the left.  Her secondary area pain is that of the lower extremities, bilaterally, with the right being also worse than the left.  She indicates that in the case of the right lower extremity the pain runs through the back of the leg all the way down into the ankle but it does not get into the foot.  She refers that her right lower extremity is always numb secondary to her stroke.  In the case of the left lower extremity this pain runs through the back of the leg to the fold of the knee.  Today we went over this pain and the possible alternatives and the patient indicated that in the past she had had a bilateral lumbar facet block for this type of pain and that provided her with excellent relief of the pain.  She has indicated being interested in having this repeated.  In fact, she took it upon herself to stop her Xarelto approximately 3 days ago in the hopes that we could bring her in tomorrow to have the procedure done.  I took this opportunity to remind the patient that is not a good idea to stop her blood thinners without having consulted [place] first since there is no way that I could guarantee her that we would have space to add another procedure tomorrow.  However, I did inform her that I would do my best in trying to see if we can accommodate her.  She understood and accepted.  Pharmacotherapy Assessment  Analgesic: No opioid analgesics prescribed by our practice.  The patient received a letter from [facility] indicating that they would not be paying for her  oxycodone prescriptions.   Monitoring: [place] PMP: PDMP reviewed during this encounter.       Pharmacotherapy: No side-effects or adverse reactions reported. Compliance: No problems identified. Effectiveness: Clinically acceptable. Plan: Refer to "POC".  UDS: No results found for: SUMMARY  Laboratory Chemistry Profile   Renal Lab Results  Component Value Date   BUN 15 03/01/2020   CREATININE 0.68 03/01/2020   BCR 21 04/16/2015   GFRAA >60 03/01/2020   GFRNONAA >60 03/01/2020     Hepatic Lab Results  Component Value Date   AST 28 02/25/2020   ALT 29 02/25/2020   ALBUMIN 3.9 02/25/2020   ALKPHOS 125 02/25/2020   LIPASE 29 03/27/2019     Electrolytes Lab Results  Component Value Date   NA 140 03/01/2020   K 3.7 03/01/2020   CL 107 03/01/2020   CALCIUM 8.2 (L) 03/01/2020   MG 2.3 02/27/2020   PHOS 3.8 02/19/2017     Bone Lab Results  Component Value Date   VD25OH 19.3 (L) 11/30/2017     Inflammation (CRP: Acute Phase) (ESR: Chronic Phase) Lab Results  Component Value Date   CRP <0.8 11/30/2017   ESRSEDRATE 19 11/30/2017   LATICACIDVEN 0.8 03/26/2015       Note: Above Lab results reviewed.  Imaging  DG PAIN CLINIC C-ARM 1-60 MIN NO REPORT Fluoro was used, but no Radiologist interpretation will be provided.  Please refer to "NOTES" tab for provider progress note.  Assessment  The primary encounter diagnosis was Chronic pain syndrome. Diagnoses of Chronic low back pain (1ry area of Pain) (Bilateral) (R>L) with sciatica (Bilateral), Chronic lower extremity pain (2ry area of Pain) (Bilateral) (R>L), Failed back surgical syndrome, Grade 2-3 (13-16 mm) Anterolisthesis  of L5 over S1 (06/22/2019), Abnormal CT scan, lumbar spine (02/25/2020), Lumbar facet syndrome (Bilateral) (R>L), Lumbar facet arthropathy, and Chronic anticoagulation (Xarelto) were also pertinent to this visit.  Plan of Care  Problem-specific:  No problem-specific Assessment & Plan notes found for  this encounter.  Ms. JAQUANA GEIGER has a current medication list which includes the following long-term medication(s): albuterol, atorvastatin, azelastine, gnp calcium 1200, cetirizine, cholestyramine, famotidine, fluticasone, furosemide, metoprolol tartrate, rivaroxaban, and topiramate.  Pharmacotherapy (Medications Ordered): No orders of the defined types were placed in this encounter.  Orders:  Orders Placed This Encounter  Procedures  . LUMBAR FACET(MEDIAL BRANCH NERVE BLOCK) MBNB    Standing Status:   Future    Standing Expiration Date:   12/08/2020    Scheduling Instructions:     Procedure: Lumbar facet block (AKA.: Lumbosacral medial branch nerve block)     Side: Bilateral     Level: L3-4, L4-5, & L5-S1 Facets (L2, L3, L4, L5, & S1 Medial Branch Nerves)     Sedation: Patient's choice.     Timeframe: ASAA    Order Specific Question:   Where will this procedure be performed?    Answer:   [facility]  . Blood Thinner Instructions to Nursing    Always make sure patient has clearance from prescribing physician to stop blood thinners for interventional therapies. If the patient requires a Lovenox-bridge therapy, make sure arrangements are made to institute it with the assistance of the PCP.    Scheduling Instructions:     Have Ms. Mohon stop the Xarelto (Rivaroxaban) x 3 days prior to procedure or surgery.   Follow-up plan:   Return for Procedure (w/ sedation): (B) L-FCT BLK #3.      Interventional Therapies  Risk  Complexity Considerations:   Anticoagulation: Xarelto. (Stop: 3 days  Restart: 6 hrs)  Allergy: Iodine and radiological contrast  Implant: Internal cardiac defibrillator  History of noncompliance with post procedure follow-ups. (Prior patient of Dr. Mohammed Kindle)   Planned  Pending:   Palliative bilateral lumbar facet block #3    Under consideration:   Diagnosticbilateral L5 TFESI #3  Possiblebilateral lumbar facet RFA  Possible intrathecal trial for  pump.   Completed:   Therapeutic/palliative bilateral lumbar facet block x2  Therapeutic/palliativebilateral L5 TFESI x2    Therapeutic  Palliative (PRN) options:   Palliative bilateral lumbar facet block #3  Palliative bilateral L5 TFESI #3     Recent Visits Date Type Provider Dept  10/03/20 Office Visit Milinda Pointer, MD [facility]  09/20/20 Procedure visit Milinda Pointer, MD [facility]  09/17/20 Office Visit Milinda Pointer, MD [facility]  09/03/20 Office Visit Milinda Pointer, MD [facility]  Showing recent visits within past 90 days and meeting all other requirements Today's Visits Date Type Provider Dept  11/07/20 Telemedicine Milinda Pointer, MD [facility]  Showing today's visits and meeting all other requirements Future Appointments Date Type Provider Dept  11/08/20 Appointment Milinda Pointer, MD [facility]  Showing future appointments within next 90 days and meeting all other requirements  I discussed the assessment and treatment plan with the patient. The patient was provided an opportunity to ask questions and all were answered. The patient agreed with the plan and demonstrated an understanding of the instructions.  Patient advised to call back or seek an in-person evaluation if the symptoms or condition worsens.  Duration of encounter: 16 minutes.  Note by: [name], MD Date: 11/07/2020;  Time: 12:33 PM

## 2020-11-07 ENCOUNTER — Ambulatory Visit: Payer: Medicaid Other | Attending: Pain Medicine | Admitting: Pain Medicine

## 2020-11-07 ENCOUNTER — Other Ambulatory Visit: Payer: Self-pay

## 2020-11-07 DIAGNOSIS — G894 Chronic pain syndrome: Secondary | ICD-10-CM | POA: Diagnosis not present

## 2020-11-07 DIAGNOSIS — M47816 Spondylosis without myelopathy or radiculopathy, lumbar region: Secondary | ICD-10-CM

## 2020-11-07 DIAGNOSIS — M5442 Lumbago with sciatica, left side: Secondary | ICD-10-CM | POA: Diagnosis not present

## 2020-11-07 DIAGNOSIS — G8929 Other chronic pain: Secondary | ICD-10-CM

## 2020-11-07 DIAGNOSIS — M4310 Spondylolisthesis, site unspecified: Secondary | ICD-10-CM

## 2020-11-07 DIAGNOSIS — M5441 Lumbago with sciatica, right side: Secondary | ICD-10-CM

## 2020-11-07 DIAGNOSIS — M79604 Pain in right leg: Secondary | ICD-10-CM | POA: Diagnosis not present

## 2020-11-07 DIAGNOSIS — M961 Postlaminectomy syndrome, not elsewhere classified: Secondary | ICD-10-CM | POA: Diagnosis not present

## 2020-11-07 DIAGNOSIS — R937 Abnormal findings on diagnostic imaging of other parts of musculoskeletal system: Secondary | ICD-10-CM | POA: Insufficient documentation

## 2020-11-07 DIAGNOSIS — Z7901 Long term (current) use of anticoagulants: Secondary | ICD-10-CM

## 2020-11-07 DIAGNOSIS — M79605 Pain in left leg: Secondary | ICD-10-CM

## 2020-11-07 NOTE — Patient Instructions (Signed)
____________________________________________________________________________________________  Preparing for Procedure with Sedation  Procedure appointments are limited to planned procedures: . No Prescription Refills. . No disability issues will be discussed. . No medication changes will be discussed.  Instructions: . Oral Intake: Do not eat or drink anything for at least 8 hours prior to your procedure. (Exception: Blood Pressure Medication. See below.) . Transportation: Unless otherwise stated by your physician, you may drive yourself after the procedure. . Blood Pressure Medicine: Do not forget to take your blood pressure medicine with a sip of water the morning of the procedure. If your Diastolic (lower reading)is above 100 mmHg, elective cases will be cancelled/rescheduled. . Blood thinners: These will need to be stopped for procedures. Notify our staff if you are taking any blood thinners. Depending on which one you take, there will be specific instructions on how and when to stop it. . Diabetics on insulin: Notify the staff so that you can be scheduled 1st case in the morning. If your diabetes requires high dose insulin, take only  of your normal insulin dose the morning of the procedure and notify the staff that you have done so. . Preventing infections: Shower with an antibacterial soap the morning of your procedure. . Build-up your immune system: Take 1000 mg of Vitamin C with every meal (3 times a day) the day prior to your procedure. . Antibiotics: Inform the staff if you have a condition or reason that requires you to take antibiotics before dental procedures. . Pregnancy: If you are pregnant, call and cancel the procedure. . Sickness: If you have a cold, fever, or any active infections, call and cancel the procedure. . Arrival: You must be in the facility at least 30 minutes prior to your scheduled procedure. . Children: Do not bring children with you. . Dress appropriately:  Bring dark clothing that you would not mind if they get stained. . Valuables: Do not bring any jewelry or valuables.  Reasons to call and reschedule or cancel your procedure: (Following these recommendations will minimize the risk of a serious complication.) . Surgeries: Avoid having procedures within 2 weeks of any surgery. (Avoid for 2 weeks before or after any surgery). . Flu Shots: Avoid having procedures within 2 weeks of a flu shots or . (Avoid for 2 weeks before or after immunizations). . Barium: Avoid having a procedure within 7-10 days after having had a radiological study involving the use of radiological contrast. (Myelograms, Barium swallow or enema study). . Heart attacks: Avoid any elective procedures or surgeries for the initial 6 months after a "Myocardial Infarction" (Heart Attack). . Blood thinners: It is imperative that you stop these medications before procedures. Let us know if you if you take any blood thinner.  . Infection: Avoid procedures during or within two weeks of an infection (including chest colds or gastrointestinal problems). Symptoms associated with infections include: Localized redness, fever, chills, night sweats or profuse sweating, burning sensation when voiding, cough, congestion, stuffiness, runny nose, sore throat, diarrhea, nausea, vomiting, cold or Flu symptoms, recent or current infections. It is specially important if the infection is over the area that we intend to treat. . Heart and lung problems: Symptoms that may suggest an active cardiopulmonary problem include: cough, chest pain, breathing difficulties or shortness of breath, dizziness, ankle swelling, uncontrolled high or unusually low blood pressure, and/or palpitations. If you are experiencing any of these symptoms, cancel your procedure and contact your primary care physician for an evaluation.  Remember:  Regular Business hours are:    Monday to Thursday 8:00 AM to 4:00 PM  Provider's  Schedule: Milinda Pointer, MD:  Procedure days: Tuesday and Thursday 7:30 AM to 4:00 PM  Gillis Santa, MD:  Procedure days: Monday and Wednesday 7:30 AM to 4:00 PM ____________________________________________________________________________________________   ____________________________________________________________________________________________  Blood Thinners  IMPORTANT NOTICE:  If you take any of these, make sure to notify the nursing staff.  Failure to do so may result in injury.  Recommended time intervals to stop and restart blood-thinners, before & after invasive procedures  Generic Name Brand Name Stop Time. Must be stopped at least this long before procedures. After procedures, wait at least this long before re-starting.  Abciximab Reopro 15 days 2 hrs  Alteplase Activase 10 days 10 days  Anagrelide Agrylin    Apixaban Eliquis 3 days 6 hrs  Cilostazol Pletal 3 days 5 hrs  Clopidogrel Plavix 7-10 days 2 hrs  Dabigatran Pradaxa 5 days 6 hrs  Dalteparin Fragmin 24 hours 4 hrs  Dipyridamole Aggrenox 11days 2 hrs  Edoxaban Lixiana; Savaysa 3 days 2 hrs  Enoxaparin  Lovenox 24 hours 4 hrs  Eptifibatide Integrillin 8 hours 2 hrs  Fondaparinux  Arixtra 72 hours 12 hrs  Hydroxychloroquine Plaquenil 11 days   Prasugrel Effient 7-10 days 6 hrs  Reteplase Retavase 10 days 10 days  Rivaroxaban Xarelto 3 days 6 hrs  Ticagrelor Brilinta 5-7 days 6 hrs  Ticlopidine Ticlid 10-14 days 2 hrs  Tinzaparin Innohep 24 hours 4 hrs  Tirofiban Aggrastat 8 hours 2 hrs  Warfarin Coumadin 5 days 2 hrs   Other medications with blood-thinning effects  Product indications Generic (Brand) names Note  Cholesterol Lipitor Stop 4 days before procedure  Blood thinner (injectable) Heparin (LMW or LMWH Heparin) Stop 24 hours before procedure  Cancer Ibrutinib (Imbruvica) Stop 7 days before procedure  Malaria/Rheumatoid Hydroxychloroquine (Plaquenil) Stop 11 days before procedure   Thrombolytics  10 days before or after procedures   Over-the-counter (OTC) Products with blood-thinning effects  Product Common names Stop Time  Aspirin > 325 mg Goody Powders, Excedrin, etc. 11 days  Aspirin ? 81 mg  7 days  Fish oil  4 days  Garlic supplements  7 days  Ginkgo biloba  36 hours  Ginseng  24 hours  NSAIDs Ibuprofen, Naprosyn, etc. 3 days  Vitamin E  4 days   ____________________________________________________________________________________________  ____________________________________________________________________________________________  General Risks and Possible Complications  Patient Responsibilities: It is important that you read this as it is part of your informed consent. It is our duty to inform you of the risks and possible complications associated with treatments offered to you. It is your responsibility as a patient to read this and to ask questions about anything that is not clear or that you believe was not covered in this document.  Patient's Rights: You have the right to refuse treatment. You also have the right to change your mind, even after initially having agreed to have the treatment done. However, under this last option, if you wait until the last second to change your mind, you may be charged for the materials used up to that point.  Introduction: Medicine is not an Chief Strategy Officer. Everything in Medicine, including the lack of treatment(s), carries the potential for danger, harm, or loss (which is by definition: Risk). In Medicine, a complication is a secondary problem, condition, or disease that can aggravate an already existing one. All treatments carry the risk of possible complications. The fact that a side effects or complications occurs, does not  imply that the treatment was conducted incorrectly. It must be clearly understood that these can happen even when everything is done following the highest safety standards.  No treatment: You can  choose not to proceed with the proposed treatment alternative. The "PRO(s)" would include: avoiding the risk of complications associated with the therapy. The "CON(s)" would include: not getting any of the treatment benefits. These benefits fall under one of three categories: diagnostic; therapeutic; and/or palliative. Diagnostic benefits include: getting information which can ultimately lead to improvement of the disease or symptom(s). Therapeutic benefits are those associated with the successful treatment of the disease. Finally, palliative benefits are those related to the decrease of the primary symptoms, without necessarily curing the condition (example: decreasing the pain from a flare-up of a chronic condition, such as incurable terminal cancer).  General Risks and Complications: These are associated to most interventional treatments. They can occur alone, or in combination. They fall under one of the following six (6) categories: no benefit or worsening of symptoms; bleeding; infection; nerve damage; allergic reactions; and/or death. 1. No benefits or worsening of symptoms: In Medicine there are no guarantees, only probabilities. No healthcare provider can ever guarantee that a medical treatment will work, they can only state the probability that it may. Furthermore, there is always the possibility that the condition may worsen, either directly, or indirectly, as a consequence of the treatment. 2. Bleeding: This is more common if the patient is taking a blood thinner, either prescription or over the counter (example: Goody Powders, Fish oil, Aspirin, Garlic, etc.), or if suffering a condition associated with impaired coagulation (example: Hemophilia, cirrhosis of the liver, low platelet counts, etc.). However, even if you do not have one on these, it can still happen. If you have any of these conditions, or take one of these drugs, make sure to notify your treating physician. 3. Infection: This is more  common in patients with a compromised immune system, either due to disease (example: diabetes, cancer, human immunodeficiency virus [HIV], etc.), or due to medications or treatments (example: therapies used to treat cancer and rheumatological diseases). However, even if you do not have one on these, it can still happen. If you have any of these conditions, or take one of these drugs, make sure to notify your treating physician. 4. Nerve Damage: This is more common when the treatment is an invasive one, but it can also happen with the use of medications, such as those used in the treatment of cancer. The damage can occur to small secondary nerves, or to large primary ones, such as those in the spinal cord and brain. This damage may be temporary or permanent and it may lead to impairments that can range from temporary numbness to permanent paralysis and/or brain death. 5. Allergic Reactions: Any time a substance or material comes in contact with our body, there is the possibility of an allergic reaction. These can range from a mild skin rash (contact dermatitis) to a severe systemic reaction (anaphylactic reaction), which can result in death. 6. Death: In general, any medical intervention can result in death, most of the time due to an unforeseen complication. ____________________________________________________________________________________________

## 2020-11-08 ENCOUNTER — Other Ambulatory Visit: Payer: Self-pay

## 2020-11-08 ENCOUNTER — Encounter: Payer: Self-pay | Admitting: Pain Medicine

## 2020-11-08 ENCOUNTER — Ambulatory Visit
Admission: RE | Admit: 2020-11-08 | Discharge: 2020-11-08 | Disposition: A | Discharge status: Home / Self Care | Payer: Medicaid Other | Source: Ambulatory Visit | Attending: Pain Medicine | Admitting: Pain Medicine

## 2020-11-08 ENCOUNTER — Ambulatory Visit: Payer: Medicaid Other | Admitting: Pain Medicine

## 2020-11-08 VITALS — BP 111/71 | HR 68 | Temp 97.3°F | Resp 13 | Ht 62.0 in | Wt 185.0 lb

## 2020-11-08 DIAGNOSIS — Z91041 Radiographic dye allergy status: Secondary | ICD-10-CM | POA: Insufficient documentation

## 2020-11-08 DIAGNOSIS — M545 Low back pain, unspecified: Secondary | ICD-10-CM

## 2020-11-08 DIAGNOSIS — Z95810 Presence of automatic (implantable) cardiac defibrillator: Secondary | ICD-10-CM | POA: Insufficient documentation

## 2020-11-08 DIAGNOSIS — Z888 Allergy status to other drugs, medicaments and biological substances status: Secondary | ICD-10-CM | POA: Insufficient documentation

## 2020-11-08 DIAGNOSIS — Z7901 Long term (current) use of anticoagulants: Secondary | ICD-10-CM | POA: Insufficient documentation

## 2020-11-08 DIAGNOSIS — M5136 Other intervertebral disc degeneration, lumbar region: Secondary | ICD-10-CM | POA: Diagnosis present

## 2020-11-08 DIAGNOSIS — M47816 Spondylosis without myelopathy or radiculopathy, lumbar region: Secondary | ICD-10-CM | POA: Insufficient documentation

## 2020-11-08 DIAGNOSIS — G8929 Other chronic pain: Secondary | ICD-10-CM | POA: Insufficient documentation

## 2020-11-08 DIAGNOSIS — M47817 Spondylosis without myelopathy or radiculopathy, lumbosacral region: Secondary | ICD-10-CM

## 2020-11-08 MED ORDER — TRIAMCINOLONE ACETONIDE 40 MG/ML IJ SUSP
INTRAMUSCULAR | Status: AC
  Filled 2020-11-08: qty 1

## 2020-11-08 MED ORDER — TRIAMCINOLONE ACETONIDE 40 MG/ML IJ SUSP
80.0000 mg | Freq: Once | INTRAMUSCULAR | Status: AC
  Administered 2020-11-08: 40 mg

## 2020-11-08 MED ORDER — ROPIVACAINE HCL 2 MG/ML IJ SOLN
18.0000 mL | Freq: Once | INTRAMUSCULAR | Status: AC
  Administered 2020-11-08: 10 mL via PERINEURAL

## 2020-11-08 MED ORDER — MIDAZOLAM HCL 5 MG/5ML IJ SOLN
INTRAMUSCULAR | Status: AC
  Filled 2020-11-08: qty 5

## 2020-11-08 MED ORDER — ROPIVACAINE HCL 2 MG/ML IJ SOLN
INTRAMUSCULAR | Status: AC
  Filled 2020-11-08: qty 10

## 2020-11-08 MED ORDER — LIDOCAINE HCL 2 % IJ SOLN
20.0000 mL | Freq: Once | INTRAMUSCULAR | Status: AC
  Administered 2020-11-08: 400 mg

## 2020-11-08 MED ORDER — LIDOCAINE HCL 2 % IJ SOLN
INTRAMUSCULAR | Status: AC
  Filled 2020-11-08: qty 20

## 2020-11-08 MED ORDER — LACTATED RINGERS IV SOLN
1000.0000 mL | Freq: Once | INTRAVENOUS | Status: AC
  Administered 2020-11-08: 1000 mL via INTRAVENOUS

## 2020-11-08 MED ORDER — MIDAZOLAM HCL 5 MG/5ML IJ SOLN
1.0000 mg | INTRAMUSCULAR | Status: DC | PRN
  Administered 2020-11-08: 2 mg via INTRAVENOUS

## 2020-11-08 MED ORDER — FENTANYL CITRATE (PF) 100 MCG/2ML IJ SOLN
25.0000 ug | INTRAMUSCULAR | Status: DC | PRN
  Administered 2020-11-08: 50 ug via INTRAVENOUS

## 2020-11-08 MED ORDER — FENTANYL CITRATE (PF) 100 MCG/2ML IJ SOLN
INTRAMUSCULAR | Status: AC
  Filled 2020-11-08: qty 2

## 2020-11-08 NOTE — Progress Notes (Addendum)
PROVIDER NOTE: Information contained herein reflects review and annotations entered in association with encounter. Interpretation of such information and data should be left to medically-trained personnel. Information provided to patient can be located elsewhere in the medical record under "Patient Instructions". Document created using STT-dictation technology, any transcriptional errors that may result from process are unintentional.    Patient: Claire Mcdonald  Service Category: Procedure  Provider: [name], MD  DOB: 1966-05-23  DOS: 11/08/2020  Location: [facility]  MRN: [id number]  Setting: Ambulatory - outpatient  Referring Provider: [facility]  Type: Established Patient  Specialty: Interventional Pain Management  PCP: [facility], [place]   Primary Reason for Visit: Interventional Pain Management Treatment. CC: Back Pain  Procedure:          Anesthesia, Analgesia, Anxiolysis:  Type: Lumbar Facet, Medial Branch Block(s) #4  Primary Purpose: Diagnostic Region: Posterolateral Lumbosacral Spine Level: L2, L3, L4, L5, & S1 Medial Branch Level(s). Injecting these levels blocks the L3-4, L4-5, and L5-S1 lumbar facet joints. Laterality: Bilateral  Type: Moderate (Conscious) Sedation combined with Local Anesthesia Indication(s): Analgesia and Anxiety Route: Intravenous (IV) IV Access: Secured Sedation: Meaningful verbal contact was maintained at all times during the procedure  Local Anesthetic: Lidocaine 1-2%  Position: Prone   Indications: 1. Lumbar facet syndrome (Bilateral) (R>L)   2. Lumbar facet arthropathy   3. Spondylosis without myelopathy or radiculopathy, lumbosacral region   4. DDD (degenerative disc disease), lumbar   5. Chronic low back pain (Bilateral) w/o sciatica    Chronic anticoagulation (Xarelto)    History of Allergy to iodine    History of allergy to radiographic contrast media    History of implantable  cardioverter-defibrillator (ICD) insertion    Pain Score: Pre-procedure: 0-No pain/10 Post-procedure: 0-No pain/10   Pre-op H&P Assessment:  Ms. Wiley is a 55 y.o. (year old), female patient, seen today for interventional treatment. She  has a past surgical history that includes Tubal ligation (1998); Ovarian cyst removal (Left); central line (03/25/2015); Hand surgery (Left); Cardiac catheterization (N/A, 03/27/2015); Cardiac catheterization (N/A, 04/11/2015); Cardiac catheterization (N/A, 07/16/2015); Colonoscopy with propofol (N/A, 10/01/2015); Esophagogastroduodenoscopy (egd) with propofol (N/A, 10/01/2015); Abdominal hysterectomy; and Hip Arthroplasty (Right, 02/27/2020). Ms. Squyres has a current medication list which includes the following prescription(s): acetaminophen, albuterol, atorvastatin, azelastine, vitamin d3, cholestyramine, diclofenac sodium, dicyclomine, furosemide, gabapentin, hydroxyzine, magnesium, methocarbamol, metoprolol tartrate, polyethylene glycol, potassium chloride sa, promethazine, rivaroxaban, sertraline, topiramate, triamcinolone, gnp calcium 1200, cetirizine, famotidine, fluticasone, and senna-docusate, and the following Facility-Administered Medications: fentanyl and midazolam. Her primarily concern today is the Back Pain  Initial Vital Signs:  Pulse/HCG Rate: 68ECG Heart Rate: 61 Temp: (!) 97.1 F (36.2 C) Resp: 16 BP: (!) 150/97 SpO2: 97 %  BMI: Estimated body mass index is 33.84 kg/m as calculated from the following:   Height as of this encounter: 5\' 2"  (1.575 m).   Weight as of this encounter: 185 lb (83.9 kg).  Risk Assessment: Allergies: Reviewed. She is allergic to tape, iodinated diagnostic agents, toradol [ketorolac tromethamine], tramadol, and zofran [ondansetron hcl].  Allergy Precautions: None required Coagulopathies: Reviewed. None identified.  Blood-thinner therapy: None at this time Active Infection(s): Reviewed. None identified. Ms. Kouns is  afebrile  Site Confirmation: Ms. Pohlman was asked to confirm the procedure and laterality before marking the site Procedure checklist: Completed Consent: Before the procedure and under the influence of no sedative(s), amnesic(s), or anxiolytics, the patient was informed of the treatment options, risks and possible complications. To fulfill our  ethical and legal obligations, as recommended by the American Medical Association's Code of Ethics, I have informed the patient of my clinical impression; the nature and purpose of the treatment or procedure; the risks, benefits, and possible complications of the intervention; the alternatives, including doing nothing; the risk(s) and benefit(s) of the alternative treatment(s) or procedure(s); and the risk(s) and benefit(s) of doing nothing. The patient was provided information about the general risks and possible complications associated with the procedure. These may include, but are not limited to: failure to achieve desired goals, infection, bleeding, organ or nerve damage, allergic reactions, paralysis, and death. In addition, the patient was informed of those risks and complications associated to Spine-related procedures, such as failure to decrease pain; infection (i.e.: Meningitis, epidural or intraspinal abscess); bleeding (i.e.: epidural hematoma, subarachnoid hemorrhage, or any other type of intraspinal or peri-dural bleeding); organ or nerve damage (i.e.: Any type of peripheral nerve, nerve root, or spinal cord injury) with subsequent damage to sensory, motor, and/or autonomic systems, resulting in permanent pain, numbness, and/or weakness of one or several areas of the body; allergic reactions; (i.e.: anaphylactic reaction); and/or death. Furthermore, the patient was informed of those risks and complications associated with the medications. These include, but are not limited to: allergic reactions (i.e.: anaphylactic or anaphylactoid reaction(s)); adrenal axis  suppression; blood sugar elevation that in diabetics may result in ketoacidosis or comma; water retention that in patients with history of congestive heart failure may result in shortness of breath, pulmonary edema, and decompensation with resultant heart failure; weight gain; swelling or edema; medication-induced neural toxicity; particulate matter embolism and blood vessel occlusion with resultant organ, and/or nervous system infarction; and/or aseptic necrosis of one or more joints. Finally, the patient was informed that Medicine is not an exact science; therefore, there is also the possibility of unforeseen or unpredictable risks and/or possible complications that may result in a catastrophic outcome. The patient indicated having understood very clearly. We have given the patient no guarantees and we have made no promises. Enough time was given to the patient to ask questions, all of which were answered to the patient's satisfaction. Ms. Nies has indicated that she wanted to continue with the procedure. Attestation: I, the ordering provider, attest that I have discussed with the patient the benefits, risks, side-effects, alternatives, likelihood of achieving goals, and potential problems during recovery for the procedure that I have provided informed consent. Date  Time: 11/08/2020  9:55 AM  Pre-Procedure Preparation:  Monitoring: As per clinic protocol. Respiration, ETCO2, SpO2, BP, heart rate and rhythm monitor placed and checked for adequate function Safety Precautions: Patient was assessed for positional comfort and pressure points before starting the procedure. Time-out: I initiated and conducted the "Time-out" before starting the procedure, as per protocol. The patient was asked to participate by confirming the accuracy of the "Time Out" information. Verification of the correct person, site, and procedure were performed and confirmed by me, the nursing staff, and the patient. "Time-out" conducted  as per Joint Commission's Universal Protocol (UP.01.01.01). Time: 1018  Description of Procedure:          Laterality: Bilateral. The procedure was performed in identical fashion on both sides. Levels:  L2, L3, L4, L5, & S1 Medial Branch Level(s) Area Prepped: Posterior Lumbosacral Region DuraPrep (Iodine Povacrylex [0.7% available iodine] and Isopropyl Alcohol, 74% w/w) Safety Precautions: Aspiration looking for blood return was conducted prior to all injections. At no point did we inject any substances, as a needle was being advanced. Before  injecting, the patient was told to immediately notify me if she was experiencing any new onset of "ringing in the ears, or metallic taste in the mouth". No attempts were made at seeking any paresthesias. Safe injection practices and needle disposal techniques used. Medications properly checked for expiration dates. SDV (single dose vial) medications used. After the completion of the procedure, all disposable equipment used was discarded in the proper designated medical waste containers. Local Anesthesia: Protocol guidelines were followed. The patient was positioned over the fluoroscopy table. The area was prepped in the usual manner. The time-out was completed. The target area was identified using fluoroscopy. A 12-in long, straight, sterile hemostat was used with fluoroscopic guidance to locate the targets for each level blocked. Once located, the skin was marked with an approved surgical skin marker. Once all sites were marked, the skin (epidermis, dermis, and hypodermis), as well as deeper tissues (fat, connective tissue and muscle) were infiltrated with a small amount of a short-acting local anesthetic, loaded on a 10cc syringe with a 25G, 1.5-in  Needle. An appropriate amount of time was allowed for local anesthetics to take effect before proceeding to the next step. Local Anesthetic: Lidocaine 2.0% The unused portion of the local anesthetic was discarded in the  proper designated containers. Technical explanation of process:  L2 Medial Branch Nerve Block (MBB): The target area for the L2 medial branch is at the junction of the postero-lateral aspect of the superior articular process and the superior, posterior, and medial edge of the transverse process of L3. Under fluoroscopic guidance, a Quincke needle was inserted until contact was made with os over the superior postero-lateral aspect of the pedicular shadow (target area). After negative aspiration for blood, 0.5 mL of the nerve block solution was injected without difficulty or complication. The needle was removed intact. L3 Medial Branch Nerve Block (MBB): The target area for the L3 medial branch is at the junction of the postero-lateral aspect of the superior articular process and the superior, posterior, and medial edge of the transverse process of L4. Under fluoroscopic guidance, a Quincke needle was inserted until contact was made with os over the superior postero-lateral aspect of the pedicular shadow (target area). After negative aspiration for blood, 0.5 mL of the nerve block solution was injected without difficulty or complication. The needle was removed intact. L4 Medial Branch Nerve Block (MBB): The target area for the L4 medial branch is at the junction of the postero-lateral aspect of the superior articular process and the superior, posterior, and medial edge of the transverse process of L5. Under fluoroscopic guidance, a Quincke needle was inserted until contact was made with os over the superior postero-lateral aspect of the pedicular shadow (target area). After negative aspiration for blood, 0.5 mL of the nerve block solution was injected without difficulty or complication. The needle was removed intact. L5 Medial Branch Nerve Block (MBB): The target area for the L5 medial branch is at the junction of the postero-lateral aspect of the superior articular process and the superior, posterior, and medial  edge of the sacral ala. Under fluoroscopic guidance, a Quincke needle was inserted until contact was made with os over the superior postero-lateral aspect of the pedicular shadow (target area). After negative aspiration for blood, 0.5 mL of the nerve block solution was injected without difficulty or complication. The needle was removed intact. S1 Medial Branch Nerve Block (MBB): The target area for the S1 medial branch is at the posterior and inferior 6 o'clock position of the  L5-S1 facet joint. Under fluoroscopic guidance, the Quincke needle inserted for the L5 MBB was redirected until contact was made with os over the inferior and postero aspect of the sacrum, at the 6 o' clock position under the L5-S1 facet joint (Target area). After negative aspiration for blood, 0.5 mL of the nerve block solution was injected without difficulty or complication. The needle was removed intact.  Nerve block solution: 0.2% PF-Ropivacaine + Triamcinolone (40 mg/mL) diluted to a final concentration of 4 mg of Triamcinolone/mL of Ropivacaine The unused portion of the solution was discarded in the proper designated containers. Procedural Needles: 22-gauge, 3.5-inch, Quincke needles used for all levels.  Once the entire procedure was completed, the treated area was cleaned, making sure to leave some of the prepping solution back to take advantage of its long term bactericidal properties.   Illustration of the posterior view of the lumbar spine and the posterior neural structures. Laminae of L2 through S1 are labeled. DPRL5, dorsal primary ramus of L5; DPRS1, dorsal primary ramus of S1; DPR3, dorsal primary ramus of L3; FJ, facet (zygapophyseal) joint L3-L4; I, inferior articular process of L4; LB1, lateral branch of dorsal primary ramus of L1; IAB, inferior articular branches from L3 medial branch (supplies L4-L5 facet joint); IBP, intermediate branch plexus; MB3, medial branch of dorsal primary ramus of L3; NR3, third lumbar  nerve root; S, superior articular process of L5; SAB, superior articular branches from L4 (supplies L4-5 facet joint also); TP3, transverse process of L3.  Vitals:   11/08/20 1032 11/08/20 1037 11/08/20 1047 11/08/20 1056  BP: 126/88 (!) 121/99 117/83 111/71  Pulse:      Resp: 13 16 11 13   Temp:  (!) 97.5 F (36.4 C)  (!) 97.3 F (36.3 C)  TempSrc:  Temporal  Temporal  SpO2: 96% 96% 97% 98%  Weight:      Height:         Start Time: 1018 hrs. End Time: 1031 hrs.  Imaging Guidance (Spinal):          Type of Imaging Technique: Fluoroscopy Guidance (Spinal) Indication(s): Assistance in needle guidance and placement for procedures requiring needle placement in or near specific anatomical locations not easily accessible without such assistance. Exposure Time: Please see nurses notes. Contrast: None used. Fluoroscopic Guidance: I was personally present during the use of fluoroscopy. "Tunnel Vision Technique" used to obtain the best possible view of the target area. Parallax error corrected before commencing the procedure. "Direction-depth-direction" technique used to introduce the needle under continuous pulsed fluoroscopy. Once target was reached, antero-posterior, oblique, and lateral fluoroscopic projection used confirm needle placement in all planes. Images permanently stored in EMR. Interpretation: No contrast injected. I personally interpreted the imaging intraoperatively. Adequate needle placement confirmed in multiple planes. Permanent images saved into the patient's record.  Antibiotic Prophylaxis:   Anti-infectives (From admission, onward)   None     Indication(s): None identified  Post-operative Assessment:  Post-procedure Vital Signs:  Pulse/HCG Rate: 68(!) 50 Temp: (!) 97.3 F (36.3 C) Resp: 13 BP: 111/71 SpO2: 98 %  EBL: None  Complications: No immediate post-treatment complications observed by team, or reported by patient.  Note: The patient tolerated the  entire procedure well. A repeat set of vitals were taken after the procedure and the patient was kept under observation following institutional policy, for this type of procedure. Post-procedural neurological assessment was performed, showing return to baseline, prior to discharge. The patient was provided with post-procedure discharge instructions, including a section on how  to identify potential problems. Should any problems arise concerning this procedure, the patient was given instructions to immediately contact us, at any time, without hesitation. In any case, we plan to contact the patient by telephone for a follow-up status report regarding this interventional procedure.  Comments:  No additional relevant information.  Plan of Care  Orders:  Orders Placed This Encounter  Procedures  . LUMBAR FACET(MEDIAL BRANCH NERVE BLOCK) MBNB    Scheduling Instructions:     Procedure: Lumbar facet block (AKA.: Lumbosacral medial branch nerve block)     Side: Bilateral     Level: L3-4, L4-5, & L5-S1 Facets (L2, L3, L4, L5, & S1 Medial Branch Nerves)     Sedation: Patient's choice.     Timeframe: Today    Order Specific Question:   Where will this procedure be performed?    Answer:   [facility]  . DG PAIN [facility] C-ARM 1-60 MIN NO REPORT    Intraoperative interpretation by procedural physician at [place].    Standing Status:   Standing    Number of Occurrences:   1    Order Specific Question:   Reason for exam:    Answer:   Assistance in needle guidance and placement for procedures requiring needle placement in or near specific anatomical locations not easily accessible without such assistance.  . Informed Consent Details: Physician/Practitioner Attestation; Transcribe to consent form and obtain patient signature    Nursing Order: Transcribe to consent form and obtain patient signature. Note: Always confirm laterality of pain with Ms. Kentner, before procedure.    Order Specific  Question:   Physician/Practitioner attestation of informed consent for procedure/surgical case    Answer:   I, the physician/practitioner, attest that I have discussed with the patient the benefits, risks, side effects, alternatives, likelihood of achieving goals and potential problems during recovery for the procedure that I have provided informed consent.    Order Specific Question:   Procedure    Answer:   Lumbar Facet Block  under fluoroscopic guidance    Order Specific Question:   Physician/Practitioner performing the procedure    Answer:   Mazie Fencl A. Dossie Arbour MD    Order Specific Question:   Indication/Reason    Answer:   Low Back Pain, with our without leg pain, due to Facet Joint Arthralgia (Joint Pain) Spondylosis (Arthritis of the Spine), without myelopathy or radiculopathy (Nerve Damage).  . Care order/instruction: Please confirm that the patient has stopped the Xarelto (Rivaroxaban) x 3 days prior to procedure or surgery.    Please confirm that the patient has stopped the Xarelto (Rivaroxaban) x 3 days prior to procedure or surgery.    Standing Status:   Standing    Number of Occurrences:   1  . Provide equipment / supplies at bedside    "Block Tray" (Disposable  single use) Needle type: SpinalSpinal Amount/quantity: 4 Size: Medium (5-inch) Gauge: 22G    Standing Status:   Standing    Number of Occurrences:   1    Order Specific Question:   Specify    Answer:   Block Tray  . Bleeding precautions    Standing Status:   Standing    Number of Occurrences:   1  . Miscellanous precautions    Standing Status:   Standing    Number of Occurrences:   1  . Miscellanous precautions    NOTE: Although It is true that patients can have allergies to shellfish and that shellfish contain  iodine, most shellfish  allergies are due to two protein allergens present in the shellfish: tropomyosins and parvalbumin. Not all patients with shellfish allergies are allergic to iodine. However, as a  precaution, avoid using iodine containing products.    Standing Status:   Standing    Number of Occurrences:   1   Chronic Opioid Analgesic:  No opioid analgesics prescribed by our practice.  The patient received a letter from [facility] indicating that they would not be paying for her oxycodone prescriptions.   Medications ordered for procedure: Meds ordered this encounter  Medications  . lidocaine (XYLOCAINE) 2 % (with pres) injection 400 mg  . lactated ringers infusion 1,000 mL  . midazolam (VERSED) 5 MG/5ML injection 1-2 mg    Make sure Flumazenil is available in the pyxis when using this medication. If oversedation occurs, administer 0.2 mg IV over 15 sec. If after 45 sec no response, administer 0.2 mg again over 1 min; may repeat at 1 min intervals; not to exceed 4 doses (1 mg)  . fentaNYL (SUBLIMAZE) injection 25-50 mcg    Make sure Narcan is available in the pyxis when using this medication. In the event of respiratory depression (RR< 8/min): Titrate NARCAN (naloxone) in increments of 0.1 to 0.2 mg IV at 2-3 minute intervals, until desired degree of reversal.  . ropivacaine (PF) 2 mg/mL (0.2%) (NAROPIN) injection 18 mL  . triamcinolone acetonide (KENALOG-40) injection 80 mg   Medications administered: We administered lidocaine, lactated ringers, midazolam, fentaNYL, ropivacaine (PF) 2 mg/mL (0.2%), and triamcinolone acetonide.  See the medical record for exact dosing, route, and time of administration.  Follow-up plan:   Return in about 2 weeks (around 11/22/2020) for on afternoon of procedure day, (F2F), (PPE).       Interventional Therapies  Risk  Complexity Considerations:   Anticoagulation: Xarelto. (Stop: 3 days  Restart: 6 hrs)  Allergy: Iodine and radiological contrast  Implant: Internal cardiac defibrillator  History of noncompliance with post procedure follow-ups. (Prior patient of Dr. Mohammed Kindle) NO Lumbar RFA 2ry to [employer].    Planned  Pending:    Diagnostic intrathecal injection of opioid analgesic as an intrathecal pump trial.    Under consideration:   Diagnosticbilateral L5 TFESI #3  Possible intrathecal pump implant (09/03/2020 referral from Dr. Nelly Laurence office.  It reads: "Reason for referral: Consideration of morphine pump for chronic pain.  Dr. Lacinda Axon will place the permanent pump if the trial is successful."   Completed:   Therapeutic/palliative bilateral lumbar facet block x4 (11/08/2020) Therapeutic/palliativebilateral L5 TFESI x2 (05/31/2019) Therapeutic right lumbar facet RFA x1 (02/07/2020)   Therapeutic  Palliative (PRN) options:   Palliative bilateral lumbar facet block #5  Palliative bilateral L5 TFESI #3     Recent Visits Date Type Provider Dept  11/07/20 Telemedicine Milinda Pointer, MD [facility]  10/03/20 Office Visit Milinda Pointer, MD [facility]  09/20/20 Procedure visit Milinda Pointer, MD [facility]  09/17/20 Office Visit Milinda Pointer, MD [facility]  09/03/20 Office Visit Milinda Pointer, MD [facility]  Showing recent visits within past 90 days and meeting all other requirements Today's Visits Date Type Provider Dept  11/08/20 Procedure visit Milinda Pointer, MD [facility]  Showing today's visits and meeting all other requirements Future Appointments Date Type Provider Dept  11/22/20 Appointment Milinda Pointer, MD [facility]  Showing future appointments within next 90 days and meeting all other requirements  Disposition: Discharge home  Discharge (Date  Time): 11/08/2020; 1103 hrs.   Primary Care Physician: [facility] Location: [facility] Note by: [name], MD Date: 11/08/2020; Time: 11:38 AM  Disclaimer:  Medicine is not an exact science. The only guarantee in medicine is that nothing is guaranteed. It is important to note that the  decision to proceed with this intervention was based on the information collected from the patient. The Data and conclusions were drawn from the patient's questionnaire, the interview, and the physical examination. Because the information was provided in large part by the patient, it cannot be guaranteed that it has not been purposely or unconsciously manipulated. Every effort has been made to obtain as much relevant data as possible for this evaluation. It is important to note that the conclusions that lead to this procedure are derived in large part from the available data. Always take into account that the treatment will also be dependent on availability of resources and existing treatment guidelines, considered by other Pain Management Practitioners as being common knowledge and practice, at the time of the intervention. For Medico-Legal purposes, it is also important to point out that variation in procedural techniques and pharmacological choices are the acceptable norm. The indications, contraindications, technique, and results of the above procedure should only be interpreted and judged by a Board-Certified Interventional Pain Specialist with extensive familiarity and expertise in the same exact procedure and technique.

## 2020-11-08 NOTE — Patient Instructions (Signed)

## 2020-11-09 ENCOUNTER — Telehealth: Payer: Self-pay

## 2020-11-09 NOTE — Telephone Encounter (Signed)
Post procedure phone call.  Patient states she is doing well.  

## 2020-11-21 NOTE — Progress Notes (Signed)
PROVIDER NOTE: Information contained herein reflects review and annotations entered in association with encounter. Interpretation of such information and data should be left to medically-trained personnel. Information provided to patient can be located elsewhere in the medical record under "Patient Instructions". Document created using STT-dictation technology, any transcriptional errors that may result from process are unintentional.    Patient: Claire Mcdonald  Service Category: E/M  Provider: [name], MD  DOB: 01/13/1966  DOS: 11/22/2020  Specialty: Interventional Pain Management  MRN: [id number]  Setting: Ambulatory outpatient  PCP: [facility]  Type: Established Patient    Referring Provider: [facility]  Location: Office  Delivery: Face-to-face     HPI  Claire Mcdonald, a 55 y.o. year old female, is here today because of her Chronic pain syndrome [G89.4]. [name] primary complain today is Back Pain Last encounter: My last encounter with her was on 11/08/2020. 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[place]); Chronic low back pain (1ry area of Pain) (Bilateral) (R>L) with sciatica (Bilateral); Chronic lower extremity pain (2ry area of Pain) (Bilateral) (R>L); Chronic pain syndrome; Grade 2-3 (13-16 mm) Anterolisthesis of L5 over S1 (06/22/2019); Lumbar foraminal stenosis (L5-S1) (Bilateral); Chronic lumbar radicular pain (S1) (Bilateral); Chronic musculoskeletal pain; Neurogenic pain; Spinal instability of lumbosacral region (L5-S1); Lumbosacral radiculopathy at S1; Abdominal pain; Female pelvic pain; Lower extremity edema; Lymphedema of both lower extremities; Restless leg syndrome; Lumbar facet syndrome (Bilateral) (R>L); Spondylosis without  myelopathy or radiculopathy, lumbosacral region; Other headache syndrome; Pars defect of lumbar spine; Myalgia; Pain in joint, lower leg; Chronic knee pain (Right); Failed back surgical syndrome; Abdominal mass; Fracture of femoral neck, right, closed ([place]); Localized edema; Osteoarthritis of both knees; S/P ORIF (open reduction internal fixation) fracture; Neck pain; Bilateral hip pain; Chronic low back pain (Bilateral) w/o sciatica; Patellofemoral stress syndrome; History of hemiarthroplasty of right hip; and Abnormal CT scan, lumbar spine (02/25/2020) on their pertinent problem list. Pain Assessment: Severity of Chronic pain is reported as a 0-No pain/10. Location: Back  /Denies. Onset: More than a month ago. Quality: Dull. Timing: Intermittent. Modifying factor(s): procedure. Vitals:  height is _0  (1.575 m) and weight is 186 lb (84.4 kg). Her temperature is 97.2 F (36.2 C) (abnormal). Her blood pressure is 126/93 (abnormal) and her pulse is 66. Her oxygen saturation is 96%.   Reason for encounter: post-procedure assessment.  The patient indicates having attained 100% ongoing relief of her low back pain.  She is very happy with it and she has agreed to give [place] a call if the pain returns.  If it does, we will treated with palliative bilateral lumbar facet blocks since they do seem to work very well for her.  This plan was shared with the patient who understood and agree.  Post-Procedure Evaluation  Procedure (11/08/2020): Therapeutic bilateral lumbar facet block #4 under fluoroscopic guidance and IV sedation Pre-procedure pain level: 0/10 Post-procedure: 0/10          Sedation: Sedation provided.  Effectiveness during initial hour after procedure(Ultra-Short Term Relief): 100 %.  Local anesthetic used: Long-acting (4-6 hours) Effectiveness: Defined as any analgesic benefit obtained secondary to the administration of local anesthetics. This carries significant diagnostic value as to the etiological  location, or anatomical origin, of the pain. Duration of benefit is expected to coincide with the duration of  the local anesthetic used.  Effectiveness during initial 4-6 hours after procedure(Short-Term Relief): 100 %.  Long-term benefit: Defined as any relief past the pharmacologic duration of the local anesthetics.  Effectiveness past the initial 6 hours after procedure(Long-Term Relief): 100 %.  Current benefits: Defined as benefit that persist at this time.   Analgesia:  Ongoing 100% relief of her low back pain. Function: Claire Mcdonald reports improvement in function ROM: Ms. Grzesiak reports improvement in ROM  Pharmacotherapy Assessment   Analgesic: No opioid analgesics prescribed by our practice.  The patient received a letter from [facility] indicating that they would not be paying for her oxycodone prescriptions.   Monitoring: [place] PMP: PDMP reviewed during this encounter.       Pharmacotherapy: No side-effects or adverse reactions reported. Compliance: No problems identified. Effectiveness: Clinically acceptable.  Chauncey Fischer, RN  11/22/2020  2:01 PM  Sign when Signing Visit Safety precautions to be maintained throughout the outpatient stay will include: orient to surroundings, keep bed in low position, maintain call bell within reach at all times, provide assistance with transfer out of bed and ambulation.     UDS: No results found for: SUMMARY   ROS  Constitutional: Denies any fever or chills Gastrointestinal: No reported hemesis, hematochezia, vomiting, or acute GI distress Musculoskeletal: Denies any acute onset joint swelling, redness, loss of ROM, or weakness Neurological: No reported episodes of acute onset apraxia, aphasia, dysarthria, agnosia, amnesia, paralysis, loss of coordination, or loss of consciousness  Medication Review  GNP Calcium 1200, Magnesium, Vitamin D3, acetaminophen, albuterol, atorvastatin, azelastine, cetirizine, cholestyramine, diclofenac Sodium,  dicyclomine, famotidine, fluticasone, furosemide, gabapentin, hydrOXYzine, methocarbamol, metoprolol tartrate, polyethylene glycol, potassium chloride SA, promethazine, rivaroxaban, senna-docusate, sertraline, topiramate, and triamcinolone  History Review  Allergy: Ms. Hogan is allergic to tape, iodinated diagnostic agents, toradol [ketorolac tromethamine], tramadol, and zofran [ondansetron hcl]. Drug: Ms. Garms  reports no history of drug use. Alcohol:  reports no history of alcohol use. Tobacco:  reports that she has never smoked. She has never used smokeless tobacco. Social: Ms. Brownlee  reports that she has never smoked. She has never used smokeless tobacco. She reports that she does not drink alcohol and does not use drugs. 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 Surgical: Ms. Taliaferro  has a past surgical history that includes Tubal ligation (1998); Ovarian cyst removal (Left); central line (03/25/2015); Hand surgery (Left); Cardiac catheterization (N/A, 03/27/2015); Cardiac catheterization (N/A, 04/11/2015); Cardiac catheterization (N/A, 07/16/2015); Colonoscopy with propofol (N/A, 10/01/2015); Esophagogastroduodenoscopy (egd) with propofol (N/A, 10/01/2015); Abdominal hysterectomy; and Hip Arthroplasty (Right, 02/27/2020). Family: family history includes Allergies in her daughter, son, and son; Asthma in her father and mother; Breast cancer in her maternal grandmother and paternal aunt; Cancer in her maternal grandmother; Depression in her mother; Diabetes in her father; Early death in her maternal grandmother and  paternal grandmother; Heart disease in her son; Hyperlipidemia in her father and mother; Hypertension in her father and mother; Kidney disease in her father.  Laboratory Chemistry Profile   Renal Lab Results  Component Value Date   BUN 15 03/01/2020   CREATININE 0.68 03/01/2020   BCR 21 04/16/2015   GFRAA >60 03/01/2020   GFRNONAA >60 03/01/2020     Hepatic Lab  Results  Component Value Date   AST 28 02/25/2020   ALT 29 02/25/2020   ALBUMIN 3.9 02/25/2020   ALKPHOS 125 02/25/2020   LIPASE 29 03/27/2019     Electrolytes Lab Results  Component Value Date   NA 140 03/01/2020   K 3.7 03/01/2020   CL 107 03/01/2020   CALCIUM 8.2 (L) 03/01/2020   MG 2.3 02/27/2020   PHOS 3.8 02/19/2017     Bone Lab Results  Component Value Date   VD25OH 19.3 (L) 11/30/2017     Inflammation (CRP: Acute Phase) (ESR: Chronic Phase) Lab Results  Component Value Date   CRP <0.8 11/30/2017   ESRSEDRATE 19 11/30/2017   LATICACIDVEN 0.8 03/26/2015       Note: Above Lab results reviewed.  Recent Imaging Review  DG PAIN CLINIC C-ARM 1-60 MIN NO REPORT Fluoro was used, but no Radiologist interpretation will be provided.  Please refer to "NOTES" tab for provider progress note. Note: Reviewed        Physical Exam  General appearance: Well nourished, well developed, and well hydrated. In no apparent acute distress Mental status: Alert, oriented x 3 (person, place, & time)       Respiratory: No evidence of acute respiratory distress Eyes: PERLA Vitals: BP (!) 126/93   Pulse 66   Temp (!) 97.2 F (36.2 C)   Ht _0  (1.575 m)   Wt 186 lb (84.4 kg)   LMP 01/08/2018   SpO2 96%   BMI 34.02 kg/m  BMI: Estimated body mass index is 34.02 kg/m as calculated from the following:   Height as of this encounter: _1  (1.575 m).   Weight as of this encounter: 186 lb (84.4 kg). Ideal: Ideal body weight: 50.1 kg (110 lb 7.2 oz) Adjusted ideal body weight: 63.8 kg (140 lb 10.7 oz)  Assessment    Status Diagnosis  Controlled Resolved Resolved 1. Chronic pain syndrome   2. Lumbar facet syndrome (Bilateral) (R>L)   3. Chronic low back pain (1ry area of Pain) (Bilateral) (R>L) with sciatica (Bilateral)   4. Lumbar facet arthropathy   5. Spondylosis without myelopathy or radiculopathy, lumbosacral region   6. DDD (degenerative disc disease), lumbar   7. Chronic low back pain (Bilateral) w/o sciatica   8. Chronic anticoagulation (Xarelto)   9. History of Allergy to iodine   10. History of allergy to radiographic contrast media      Updated Problems: No problems updated.  Plan of Care  Problem-specific:  No problem-specific Assessment & Plan notes found for this encounter.  Ms. ELLYN RUBIANO has a current medication list which includes the following long-term medication(s): albuterol, atorvastatin, azelastine, cholestyramine, furosemide, metoprolol tartrate, rivaroxaban, topiramate, gnp calcium 1200, cetirizine, famotidine, and fluticasone.  Pharmacotherapy (Medications Ordered): No orders of the defined types were placed in this encounter.  Orders:  Orders Placed This Encounter  Procedures  . LUMBAR FACET(MEDIAL BRANCH NERVE BLOCK) MBNB    Standing Status:   Standing    Number of Occurrences:   1    Standing Expiration Date:   11/22/2021    Scheduling Instructions:     Procedure: Lumbar facet block (AKA.: Lumbosacral medial branch nerve block)     Side: Bilateral     Level: L3-4, L4-5, & L5-S1 Facets (L2, L3, L4, L5, & S1 Medial Branch Nerves)     Sedation: Patient's choice.     Timeframe: ASAA    Order Specific Question:   Where will this procedure be performed?  Answer:   [facility]   Follow-up plan:   Return if symptoms worsen or fail to improve, for PRN Procedure (w/ sedation): (B) L-FCT BLK.      Interventional Therapies  Risk  Complexity Considerations:   Anticoagulation: Xarelto. (Stop: 3 days  Restart: 6 hrs)  Allergy: Iodine and radiological  contrast  Implant: Internal cardiac defibrillator  History of noncompliance with post procedure follow-ups. (Prior patient of Dr. Mohammed Kindle) NO Lumbar RFA 2ry to [employer].    Planned  Pending:   Diagnostic intrathecal injection of opioid analgesic as an intrathecal pump trial.    Under consideration:   Diagnosticbilateral L5 TFESI #3  Possible intrathecal pump implant (09/03/2020 referral from Dr. Nelly Laurence office.  It reads: "Reason for referral: Consideration of morphine pump for chronic pain.  Dr. Lacinda Axon will place the permanent pump if the trial is successful."   Completed:   Therapeutic/palliative bilateral lumbar facet block x4 (11/08/2020) (100/100/100/100) Therapeutic/palliativebilateral L5 TFESI x2 (05/31/2019) Therapeutic right lumbar facet RFA x1 (02/07/2020) (do not repeat due to hardware)   Therapeutic  Palliative (PRN) options:   Palliative bilateral lumbar facet block #5  Palliative bilateral L5 TFESI #3     Recent Visits Date Type Provider Dept  11/08/20 Procedure visit Milinda Pointer, MD [facility]  11/07/20 Telemedicine Milinda Pointer, [facility]  10/03/20 Office Visit Milinda Pointer, MD [facility]  09/20/20 Procedure visit Milinda Pointer, MD [facility]  09/17/20 Office Visit Milinda Pointer, MD [facility]  09/03/20 Office Visit Milinda Pointer, MD [facility]  Showing recent visits within past 90 days and meeting all other requirements Today's Visits Date Type Provider Dept  11/22/20 Office Visit Milinda Pointer, MD [facility]  Showing today's visits and meeting all other requirements Future Appointments No visits were found meeting these conditions. Showing future appointments within next 90 days and meeting all other requirements  I discussed the assessment and treatment plan with the patient. The patient was provided an opportunity to ask  questions and all were answered. The patient agreed with the plan and demonstrated an understanding of the instructions.  Patient advised to call back or seek an in-person evaluation if the symptoms or condition worsens.  Duration of encounter: 25 minutes.  Note by: [name], MD Date: 11/22/2020; Time: 2:50 PM

## 2020-11-22 ENCOUNTER — Other Ambulatory Visit: Payer: Self-pay

## 2020-11-22 ENCOUNTER — Encounter: Payer: Self-pay | Admitting: Pain Medicine

## 2020-11-22 ENCOUNTER — Ambulatory Visit: Payer: Medicaid Other | Attending: Pain Medicine | Admitting: Pain Medicine

## 2020-11-22 VITALS — BP 126/93 | HR 66 | Temp 97.2°F | Ht 62.0 in | Wt 186.0 lb

## 2020-11-22 DIAGNOSIS — M5441 Lumbago with sciatica, right side: Secondary | ICD-10-CM | POA: Diagnosis present

## 2020-11-22 DIAGNOSIS — M47817 Spondylosis without myelopathy or radiculopathy, lumbosacral region: Secondary | ICD-10-CM

## 2020-11-22 DIAGNOSIS — Z7901 Long term (current) use of anticoagulants: Secondary | ICD-10-CM

## 2020-11-22 DIAGNOSIS — G8929 Other chronic pain: Secondary | ICD-10-CM

## 2020-11-22 DIAGNOSIS — M47816 Spondylosis without myelopathy or radiculopathy, lumbar region: Secondary | ICD-10-CM

## 2020-11-22 DIAGNOSIS — M51369 Other intervertebral disc degeneration, lumbar region without mention of lumbar back pain or lower extremity pain: Secondary | ICD-10-CM

## 2020-11-22 DIAGNOSIS — Z91041 Radiographic dye allergy status: Secondary | ICD-10-CM | POA: Diagnosis present

## 2020-11-22 DIAGNOSIS — Z888 Allergy status to other drugs, medicaments and biological substances status: Secondary | ICD-10-CM

## 2020-11-22 DIAGNOSIS — G894 Chronic pain syndrome: Secondary | ICD-10-CM

## 2020-11-22 DIAGNOSIS — M545 Low back pain, unspecified: Secondary | ICD-10-CM | POA: Diagnosis present

## 2020-11-22 DIAGNOSIS — M5442 Lumbago with sciatica, left side: Secondary | ICD-10-CM

## 2020-11-22 DIAGNOSIS — M5136 Other intervertebral disc degeneration, lumbar region: Secondary | ICD-10-CM

## 2020-11-22 NOTE — Progress Notes (Signed)
Safety precautions to be maintained throughout the outpatient stay will include: orient to surroundings, keep bed in low position, maintain call bell within reach at all times, provide assistance with transfer out of bed and ambulation.  

## 2020-11-22 NOTE — Patient Instructions (Signed)
____________________________________________________________________________________________  Preparing for Procedure with Sedation  Procedure appointments are limited to planned procedures: . No Prescription Refills. . No disability issues will be discussed. . No medication changes will be discussed.  Instructions: . Oral Intake: Do not eat or drink anything for at least 8 hours prior to your procedure. (Exception: Blood Pressure Medication. See below.) . Transportation: Unless otherwise stated by your physician, you may drive yourself after the procedure. . Blood Pressure Medicine: Do not forget to take your blood pressure medicine with a sip of water the morning of the procedure. If your Diastolic (lower reading)is above 100 mmHg, elective cases will be cancelled/rescheduled. . Blood thinners: These will need to be stopped for procedures. Notify our staff if you are taking any blood thinners. Depending on which one you take, there will be specific instructions on how and when to stop it. . Diabetics on insulin: Notify the staff so that you can be scheduled 1st case in the morning. If your diabetes requires high dose insulin, take only  of your normal insulin dose the morning of the procedure and notify the staff that you have done so. . Preventing infections: Shower with an antibacterial soap the morning of your procedure. . Build-up your immune system: Take 1000 mg of Vitamin C with every meal (3 times a day) the day prior to your procedure. . Antibiotics: Inform the staff if you have a condition or reason that requires you to take antibiotics before dental procedures. . Pregnancy: If you are pregnant, call and cancel the procedure. . Sickness: If you have a cold, fever, or any active infections, call and cancel the procedure. . Arrival: You must be in the facility at least 30 minutes prior to your scheduled procedure. . Children: Do not bring children with you. . Dress appropriately:  Bring dark clothing that you would not mind if they get stained. . Valuables: Do not bring any jewelry or valuables.  Reasons to call and reschedule or cancel your procedure: (Following these recommendations will minimize the risk of a serious complication.) . Surgeries: Avoid having procedures within 2 weeks of any surgery. (Avoid for 2 weeks before or after any surgery). . Flu Shots: Avoid having procedures within 2 weeks of a flu shots or . (Avoid for 2 weeks before or after immunizations). . Barium: Avoid having a procedure within 7-10 days after having had a radiological study involving the use of radiological contrast. (Myelograms, Barium swallow or enema study). . Heart attacks: Avoid any elective procedures or surgeries for the initial 6 months after a "Myocardial Infarction" (Heart Attack). . Blood thinners: It is imperative that you stop these medications before procedures. Let us know if you if you take any blood thinner.  . Infection: Avoid procedures during or within two weeks of an infection (including chest colds or gastrointestinal problems). Symptoms associated with infections include: Localized redness, fever, chills, night sweats or profuse sweating, burning sensation when voiding, cough, congestion, stuffiness, runny nose, sore throat, diarrhea, nausea, vomiting, cold or Flu symptoms, recent or current infections. It is specially important if the infection is over the area that we intend to treat. . Heart and lung problems: Symptoms that may suggest an active cardiopulmonary problem include: cough, chest pain, breathing difficulties or shortness of breath, dizziness, ankle swelling, uncontrolled high or unusually low blood pressure, and/or palpitations. If you are experiencing any of these symptoms, cancel your procedure and contact your primary care physician for an evaluation.  Remember:  Regular Business hours are:    Monday to Thursday 8:00 AM to 4:00 PM  Provider's  Schedule: Adis Sturgill, MD:  Procedure days: Tuesday and Thursday 7:30 AM to 4:00 PM  Bilal Lateef, MD:  Procedure days: Monday and Wednesday 7:30 AM to 4:00 PM ____________________________________________________________________________________________    

## 2020-12-27 ENCOUNTER — Telehealth: Payer: Self-pay | Admitting: Pain Medicine

## 2020-12-27 NOTE — Telephone Encounter (Signed)
Patient called to schedule PRN L FCTS, last one was in March, scheduled her for May 17th at 8am

## 2021-01-08 ENCOUNTER — Ambulatory Visit
Admission: RE | Admit: 2021-01-08 | Discharge: 2021-01-08 | Disposition: A | Discharge status: Home / Self Care | Payer: Medicaid Other | Source: Ambulatory Visit | Attending: Pain Medicine | Admitting: Pain Medicine

## 2021-01-08 ENCOUNTER — Other Ambulatory Visit: Payer: Self-pay

## 2021-01-08 ENCOUNTER — Ambulatory Visit (HOSPITAL_BASED_OUTPATIENT_CLINIC_OR_DEPARTMENT_OTHER): Payer: Medicaid Other | Admitting: Pain Medicine

## 2021-01-08 VITALS — BP 110/88 | HR 58 | Temp 97.3°F | Resp 18 | Ht 62.0 in | Wt 186.0 lb

## 2021-01-08 DIAGNOSIS — M47817 Spondylosis without myelopathy or radiculopathy, lumbosacral region: Secondary | ICD-10-CM

## 2021-01-08 DIAGNOSIS — M47816 Spondylosis without myelopathy or radiculopathy, lumbar region: Secondary | ICD-10-CM | POA: Diagnosis not present

## 2021-01-08 DIAGNOSIS — M79604 Pain in right leg: Secondary | ICD-10-CM | POA: Diagnosis present

## 2021-01-08 DIAGNOSIS — M79605 Pain in left leg: Secondary | ICD-10-CM

## 2021-01-08 DIAGNOSIS — Z7901 Long term (current) use of anticoagulants: Secondary | ICD-10-CM | POA: Insufficient documentation

## 2021-01-08 DIAGNOSIS — G8929 Other chronic pain: Secondary | ICD-10-CM | POA: Diagnosis present

## 2021-01-08 DIAGNOSIS — M545 Low back pain, unspecified: Secondary | ICD-10-CM

## 2021-01-08 DIAGNOSIS — M4310 Spondylolisthesis, site unspecified: Secondary | ICD-10-CM | POA: Insufficient documentation

## 2021-01-08 MED ORDER — LACTATED RINGERS IV SOLN
1000.0000 mL | Freq: Once | INTRAVENOUS | Status: AC
  Administered 2021-01-08: 1000 mL via INTRAVENOUS

## 2021-01-08 MED ORDER — LIDOCAINE HCL 2 % IJ SOLN
20.0000 mL | Freq: Once | INTRAMUSCULAR | Status: AC
Start: 2021-01-08 — End: 2021-01-08
  Administered 2021-01-08: 400 mg
  Filled 2021-01-08: qty 20

## 2021-01-08 MED ORDER — FENTANYL CITRATE (PF) 100 MCG/2ML IJ SOLN
25.0000 ug | INTRAMUSCULAR | Status: DC | PRN
  Administered 2021-01-08: 50 ug via INTRAVENOUS
  Filled 2021-01-08: qty 2

## 2021-01-08 MED ORDER — ROPIVACAINE HCL 2 MG/ML IJ SOLN
18.0000 mL | Freq: Once | INTRAMUSCULAR | Status: AC
  Administered 2021-01-08: 18 mL via PERINEURAL
  Filled 2021-01-08: qty 20

## 2021-01-08 MED ORDER — TRIAMCINOLONE ACETONIDE 40 MG/ML IJ SUSP
80.0000 mg | Freq: Once | INTRAMUSCULAR | Status: AC
Start: 2021-01-08 — End: 2021-01-08
  Administered 2021-01-08: 80 mg
  Filled 2021-01-08: qty 2

## 2021-01-08 MED ORDER — MIDAZOLAM HCL 5 MG/5ML IJ SOLN
1.0000 mg | INTRAMUSCULAR | Status: DC | PRN
  Administered 2021-01-08: 1 mg via INTRAVENOUS
  Filled 2021-01-08: qty 5

## 2021-01-08 NOTE — Progress Notes (Signed)
Safety precautions to be maintained throughout the outpatient stay will include: orient to surroundings, keep bed in low position, maintain call bell within reach at all times, provide assistance with transfer out of bed and ambulation.  

## 2021-01-08 NOTE — Patient Instructions (Addendum)
____________________________________________________________________________________________  Post-Procedure Discharge Instructions  Instructions:  Apply ice:   Purpose: This will minimize any swelling and discomfort after procedure.   When: Day of procedure, as soon as you get home.  How: Fill a plastic sandwich bag with crushed ice. Cover it with a small towel and apply to injection site.  How long: (15 min on, 15 min off) Apply for 15 minutes then remove x 15 minutes.  Repeat sequence on day of procedure, until you go to bed.  Apply heat:   Purpose: To treat any soreness and discomfort from the procedure.  When: Starting the next day after the procedure.  How: Apply heat to procedure site starting the day following the procedure.  How long: May continue to repeat daily, until discomfort goes away.  Food intake: Start with clear liquids (like water) and advance to regular food, as tolerated.   Physical activities: Keep activities to a minimum for the first 8 hours after the procedure. After that, then as tolerated.  Driving: If you have received any sedation, be responsible and do not drive. You are not allowed to drive for 24 hours after having sedation.  Blood thinner: (Applies only to those taking blood thinners) You may restart your blood thinner 6 hours after your procedure.  Insulin: (Applies only to Diabetic patients taking insulin) As soon as you can eat, you may resume your normal dosing schedule.  Infection prevention: Keep procedure site clean and dry. Shower daily and clean area with soap and water.  Post-procedure Pain Diary: Extremely important that this be done correctly and accurately. Recorded information will be used to determine the next step in treatment. For the purpose of accuracy, follow these rules:  Evaluate only the area treated. Do not report or include pain from an untreated area. For the purpose of this evaluation, ignore all other areas of pain,  except for the treated area.  After your procedure, avoid taking a long nap and attempting to complete the pain diary after you wake up. Instead, set your alarm clock to go off every hour, on the hour, for the initial 8 hours after the procedure. Document the duration of the numbing medicine, and the relief you are getting from it.  Do not go to sleep and attempt to complete it later. It will not be accurate. If you received sedation, it is likely that you were given a medication that may cause amnesia. Because of this, completing the diary at a later time may cause the information to be inaccurate. This information is needed to plan your care.  Follow-up appointment: Keep your post-procedure follow-up evaluation appointment after the procedure (usually 2 weeks for most procedures, 6 weeks for radiofrequencies). DO NOT FORGET to bring you pain diary with you.   Expect: (What should I expect to see with my procedure?)  From numbing medicine (AKA: Local Anesthetics): Numbness or decrease in pain. You may also experience some weakness, which if present, could last for the duration of the local anesthetic.  Onset: Full effect within 15 minutes of injected.  Duration: It will depend on the type of local anesthetic used. On the average, 1 to 8 hours.   From steroids (Applies only if steroids were used): Decrease in swelling or inflammation. Once inflammation is improved, relief of the pain will follow.  Onset of benefits: Depends on the amount of swelling present. The more swelling, the longer it will take for the benefits to be seen. In some cases, up to 10 days.    Duration: Steroids will stay in the system x 2 weeks. Duration of benefits will depend on multiple posibilities including persistent irritating factors.  Side-effects: If present, they may typically last 2 weeks (the duration of the steroids).  Frequent: Cramps (if they occur, drink Gatorade and take over-the-counter Magnesium 450-500 mg  once to twice a day); water retention with temporary weight gain; increases in blood sugar; decreased immune system response; increased appetite.  Occasional: Facial flushing (red, warm cheeks); mood swings; menstrual changes.  Uncommon: Long-term decrease or suppression of natural hormones; bone thinning. (These are more common with higher doses or more frequent use. This is why we prefer that our patients avoid having any injection therapies in other practices.)   Very Rare: Severe mood changes; psychosis; aseptic necrosis.  From procedure: Some discomfort is to be expected once the numbing medicine wears off. This should be minimal if ice and heat are applied as instructed.  Call if: (When should I call?)  You experience numbness and weakness that gets worse with time, as opposed to wearing off.  New onset bowel or bladder incontinence. (Applies only to procedures done in the spine)  Emergency Numbers:  Durning business hours (Monday - Thursday, 8:00 AM - 4:00 PM) (Friday, 9:00 AM - 12:00 Noon): [phone]  After hours: [phone]  NOTE: If you are having a problem and are unable connect with, or to talk to a provider, then go to your nearest urgent care or emergency department. If the problem is serious and urgent, please call 911. ____________________________________________________________________________________________   ____________________________________________________________________________________________  Virtual Visits   What is a "Virtual Visit"? It is a Metallurgist (medical visit) that takes place on real time (NOT TEXT or E-MAIL) over the telephone or computer device (desktop, laptop, tablet, smart phone, etc.). It allows for more location flexibility between the patient and the healthcare provider.  Who decides when these types of visits will be used? The physician.  Who is eligible for these types of visits? Only those patients  that can be reliably reached over the telephone.  What do you mean by reliably? We do not have time to call everyone multiple times, therefore those that tend to screen calls and then call back later are not suitable candidates for this system. We understand how people are reluctant to pickup on "unknown" calls, therefore, we suggest adding our telephone numbers to your list of "CONTACT(s)". This way, you should be able to readily identify our calls when you receive one. All of our numbers are available below.   Who is not eligible? This option is not available for medication management encounters, specially for controlled substances. Patients on pain medications that fall under the category of controlled substances have to come in for "Face-to-Face" encounters. This is required for mandatory monitoring of these substances. You may be asked to provide a sample for an unannounced urine drug screening test (UDS), and we will need to count your pain pills. Not bringing your pills to be counted may result in no refill. Obviously, neither one of these can be done over the phone.  When will this type of visits be used? You can request a virtual visit whenever you are physically unable to attend a regular appointment. The decision will be made by the physician (or healthcare provider) on a case by case basis.   At what time will I be called? This is an excellent question. The providers will try to call you whenever they have time available. Do  not expect to be called at any specific time. The secretaries will assign you a time for your virtual visit appointment, but this is done simply to keep a list of those patients that need to be called, but not for the purpose of keeping a time schedule. Be advised that the call may come in anytime during the day, between the hours of 8:00 AM and 8::00 PM, depending on provider availability. We do understand that the system is not perfect. If you are unable to be available  that day on a moments notice, then request an "in-person" appointment rather than a "virtual visit".  Can I request my medication visits to be "Virtual"? Yes you may request it, but the decision is entirely up to the healthcare provider. Control substances require specific monitoring that requires Face-to-Face encounters. The number of encounters  and the extent of the monitoring is determined on a case by case basis.  Add a new contact to your smart phone and label it "PAIN CLINIC" Under this contact add the following numbers: Main: [phone] (Official Contact Number) Nurses: [phone] (These are outgoing only calling systems. Do not call this number.) Dr. Dossie Arbour: [phone] or [phone] (Outgoing calls only. Do not call this number.)  ____________________________________________________________________________________________   Pain Management Discharge Instructions  General Discharge Instructions :  If you need to reach your doctor call: Monday-Friday 8:00 am - 4:00 pm at [phone] or toll free [phone].  After clinic hours [phone] to have operator reach doctor.  Bring all of your medication bottles to all your appointments in the pain clinic.  To cancel or reschedule your appointment with Pain Management please remember to call 24 hours in advance to avoid a fee.  Refer to the educational materials which you have been given on: General Risks, I had my Procedure. Discharge Instructions, Post Sedation.  Post Procedure Instructions:  The drugs you were given will stay in your system until tomorrow, so for the next 24 hours you should not drive, make any legal decisions or drink any alcoholic beverages.  You may eat anything you prefer, but it is better to start with liquids then soups and crackers, and gradually work up to solid foods.  Please notify your doctor immediately if you have any unusual bleeding, trouble breathing or pain that is not  related to your normal pain.  Depending on the type of procedure that was done, some parts of your body may feel week and/or numb.  This usually clears up by tonight or the next day.  Walk with the use of an assistive device or accompanied by an adult for the 24 hours.  You may use ice on the affected area for the first 24 hours.  Put ice in a Ziploc bag and cover with a towel and place against area 15 minutes on 15 minutes off.  You may switch to heat after 24 hours. Facet Joint Block The facet joints connect the bones of the spine (vertebrae). They make it possible for you to bend, twist, and make other movements with your spine. They also keep you from bending too far, twisting too far, and making other extreme movements. A facet joint block is a procedure in which a numbing medicine (anesthetic) is injected into a facet joint. In many cases, an anti-inflammatory medicine (steroid) is also injected. A facet joint block may be done:  To diagnose neck or back pain. If the pain gets better after a facet joint block, it means the  pain is probably coming from the facet joint. If the pain does not get better, it means the pain is probably not coming from the facet joint.  To relieve neck or back pain that is caused by an inflamed facet joint. A facet joint block is only done to relieve pain if the pain does not improve with other methods, such as medicine, exercise programs, and physical therapy. Tell a health care provider about:  Any allergies you have.  All medicines you are taking, including vitamins, herbs, eye drops, creams, and over-the-counter medicines.  Any problems you or family members have had with anesthetic medicines.  Any blood disorders you have.  Any surgeries you have had.  Any medical conditions you have or have had.  Whether you are pregnant or may be pregnant. What are the risks? Generally, this is a safe procedure. However, problems may occur,  including:  Bleeding.  Injury to a nerve near the injection site.  Pain at the injection site.  Weakness or numbness in areas controlled by nerves near the injection site.  Infection.  Temporary fluid retention.  Allergic reactions to medicines or dyes.  Injury to other structures or organs near the injection site. What happens before the procedure? Medicines Ask your health care provider about:  Changing or stopping your regular medicines. This is especially important if you are taking diabetes medicines or blood thinners.  Taking medicines such as aspirin and ibuprofen. These medicines can thin your blood. Do not take these medicines unless your health care provider tells you to take them.  Taking over-the-counter medicines, vitamins, herbs, and supplements. Eating and drinking Follow instructions from your health care provider about eating and drinking, which may include:  8 hours before the procedure - stop eating heavy meals or foods, such as meat, fried foods, or fatty foods.  6 hours before the procedure - stop eating light meals or foods, such as toast or cereal.  6 hours before the procedure - stop drinking milk or drinks that contain milk.  2 hours before the procedure - stop drinking clear liquids. Staying hydrated Follow instructions from your health care provider about hydration, which may include:  Up to 2 hours before the procedure - you may continue to drink clear liquids, such as water, clear fruit juice, black coffee, and plain tea. General instructions  Do not use any products that contain nicotine or tobacco for at least 4-6 weeks before the procedure. These products include cigarettes, e-cigarettes, and chewing tobacco. If you need help quitting, ask your health care provider.  Plan to have someone take you home from the hospital or clinic.  Ask your health care provider: ? How your surgery site will be marked. ? What steps will be taken to help  prevent infection. These may include:  Removing hair at the surgery site.  Washing skin with a germ-killing soap.  Receiving antibiotic medicine. What happens during the procedure?  You will put on a hospital gown.  You will lie on your stomach on an X-ray table. You may be asked to lie in a different position if an injection will be made in your neck.  Machines will be used to monitor your oxygen levels, heart rate, and blood pressure.  Your skin will be cleaned.  If an injection will be made in your neck, an IV will be inserted into one of your veins. Fluids and medicine will flow directly into your body through the IV.  A numbing medicine (local anesthetic) will  be applied to your skin. Your skin may sting or burn for a moment.  A video X-ray machine (fluoroscopy) will be used to find the joint. In some cases, a CT scan may be used.  A contrast dye may be injected into the facet joint area to help find the joint.  When the joint is located, an anesthetic will be injected into the joint through the needle.  Your health care provider will ask you whether you feel pain relief. ? If you feel relief, a steroid may be injected to provide pain relief for a longer period of time. ? If you do not feel relief or feel only partial relief, additional injections of an anesthetic may be made in other facet joints.  The needle will be removed.  Your skin will be cleaned.  A bandage (dressing) will be applied over each injection site. The procedure may vary among health care providers and hospitals.   What happens after the procedure?  Your blood pressure, heart rate, breathing rate, and blood oxygen level will be monitored until you leave the hospital or clinic.  You will lie down and rest for a period of time. Summary  A facet joint block is a procedure in which a numbing medicine (anesthetic) is injected into a facet joint. An anti-inflammatory medicine (stereoid) may also be  injected.  Follow instructions from your health care provider about medicines and eating and drinking before the procedure.  Do not use any products that contain nicotine or tobacco for at least 4-6 weeks before the procedure.  You will lie on your stomach for the procedure, but you may be asked to lie in a different position if an injection will be made in your neck.  When the joint is located, an anesthetic will be injected into the joint through the needle. This information is not intended to replace advice given to you by your health care provider. Make sure you discuss any questions you have with your health care provider. Document Revised: 12/02/2018 Document Reviewed: 07/16/2018 [employer]vier Patient Education  2021 [employer].

## 2021-01-08 NOTE — Progress Notes (Signed)
PROVIDER NOTE: Information contained herein reflects review and annotations entered in association with encounter. Interpretation of such information and data should be left to medically-trained personnel. Information provided to patient can be located elsewhere in the medical record under "Patient Instructions". Document created using STT-dictation technology, any transcriptional errors that may result from process are unintentional.    Patient: Claire Mcdonald  Service Category: Procedure  Provider: [name], MD  DOB: 04/12/1966  DOS: 01/08/2021  Location: [facility]  MRN: [id number]  Setting: Ambulatory - outpatient  Referring Provider: [facility]  Type: Established Patient  Specialty: Interventional Pain Management  PCP: [facility], [place]   Primary Reason for Visit: Interventional Pain Management Treatment. CC: Back Pain (lower)  Procedure:          Anesthesia, Analgesia, Anxiolysis:  Type: Lumbar Facet, Medial Branch Block(s) #5  Primary Purpose: Diagnostic Region: Posterolateral Lumbosacral Spine Level: L2, L3, L4, L5, & S1 Medial Branch Level(s). Injecting these levels blocks the L3-4, L4-5, and L5-S1 lumbar facet joints. Laterality: Bilateral  Type: Moderate (Conscious) Sedation combined with Local Anesthesia Indication(s): Analgesia and Anxiety Route: Intravenous (IV) IV Access: Secured Sedation: Meaningful verbal contact was maintained at all times during the procedure  Local Anesthetic: Lidocaine 1-2%  Position: Prone   Indications: 1. Lumbar facet syndrome (Bilateral) (R>L)   2. Lumbar facet arthropathy   3. Spondylosis without myelopathy or radiculopathy, lumbosacral region   4. Grade 2-3 (13-16 mm) Anterolisthesis of L5 over S1 (06/22/2019)   5. Chronic low back pain (Bilateral) w/o sciatica   6. Chronic lower extremity pain (2ry area of Pain) (Bilateral) (R>L)   7. Chronic anticoagulation (Xarelto)    Pain Score: Pre-procedure:  9 /10 Post-procedure: 0-No pain/10   Pre-op H&P Assessment:  Claire Mcdonald is a 55 y.o. (year old), female patient, seen today for interventional treatment. She  has a past surgical history that includes Tubal ligation (1998); Ovarian cyst removal (Left); central line (03/25/2015); Hand surgery (Left); Cardiac catheterization (N/A, 03/27/2015); Cardiac catheterization (N/A, 04/11/2015); Cardiac catheterization (N/A, 07/16/2015); Colonoscopy with propofol (N/A, 10/01/2015); Esophagogastroduodenoscopy (egd) with propofol (N/A, 10/01/2015); Abdominal hysterectomy; and Hip Arthroplasty (Right, 02/27/2020). [name] has a current medication list which includes the following prescription(s): acetaminophen, albuterol, atorvastatin, vitamin d3, cholestyramine, diclofenac sodium, dicyclomine, furosemide, gabapentin, hydroxyzine, magnesium, methocarbamol, metoprolol tartrate, polyethylene glycol, potassium chloride sa, promethazine, rivaroxaban, senna-docusate, sertraline, topiramate, triamcinolone, azelastine, gnp calcium 1200, cetirizine, famotidine, and fluticasone, and the following Facility-Administered Medications: fentanyl and midazolam. Her primarily concern today is the Back Pain (lower)  Initial Vital Signs:  Pulse/HCG Rate: 65ECG Heart Rate: (!) 56 Temp: (!) 97.3 F (36.3 C) Resp: 14 BP: (!) 129/94 SpO2: 98 %  BMI: Estimated body mass index is 34.02 kg/m as calculated from the following:   Height as of this encounter: 5\' 2"  (1.575 m).   Weight as of this encounter: 186 lb (84.4 kg).  Risk Assessment: Allergies: Reviewed. She is allergic to tape, iodinated diagnostic agents, toradol [ketorolac tromethamine], tramadol, and zofran [ondansetron hcl].  Allergy Precautions: None required Coagulopathies: Reviewed. None identified.  Blood-thinner therapy: None at this time Active Infection(s): Reviewed. None identified. Claire Mcdonald is afebrile  Site Confirmation: Claire Mcdonald was asked to confirm the procedure and  laterality before marking the site Procedure checklist: Completed Consent: Before the procedure and under the influence of no sedative(s), amnesic(s), or anxiolytics, the patient was informed of the treatment options, risks and possible complications. To fulfill our ethical and legal obligations, as recommended  by the American Medical Association's Code of Ethics, I have informed the patient of my clinical impression; the nature and purpose of the treatment or procedure; the risks, benefits, and possible complications of the intervention; the alternatives, including doing nothing; the risk(s) and benefit(s) of the alternative treatment(s) or procedure(s); and the risk(s) and benefit(s) of doing nothing. The patient was provided information about the general risks and possible complications associated with the procedure. These may include, but are not limited to: failure to achieve desired goals, infection, bleeding, organ or nerve damage, allergic reactions, paralysis, and death. In addition, the patient was informed of those risks and complications associated to Spine-related procedures, such as failure to decrease pain; infection (i.e.: Meningitis, epidural or intraspinal abscess); bleeding (i.e.: epidural hematoma, subarachnoid hemorrhage, or any other type of intraspinal or peri-dural bleeding); organ or nerve damage (i.e.: Any type of peripheral nerve, nerve root, or spinal cord injury) with subsequent damage to sensory, motor, and/or autonomic systems, resulting in permanent pain, numbness, and/or weakness of one or several areas of the body; allergic reactions; (i.e.: anaphylactic reaction); and/or death. Furthermore, the patient was informed of those risks and complications associated with the medications. These include, but are not limited to: allergic reactions (i.e.: anaphylactic or anaphylactoid reaction(s)); adrenal axis suppression; blood sugar elevation that in diabetics may result in  ketoacidosis or comma; water retention that in patients with history of congestive heart failure may result in shortness of breath, pulmonary edema, and decompensation with resultant heart failure; weight gain; swelling or edema; medication-induced neural toxicity; particulate matter embolism and blood vessel occlusion with resultant organ, and/or nervous system infarction; and/or aseptic necrosis of one or more joints. Finally, the patient was informed that Medicine is not an exact science; therefore, there is also the possibility of unforeseen or unpredictable risks and/or possible complications that may result in a catastrophic outcome. The patient indicated having understood very clearly. We have given the patient no guarantees and we have made no promises. Enough time was given to the patient to ask questions, all of which were answered to the patient's satisfaction. Ms. Perezmartinez has indicated that she wanted to continue with the procedure. Attestation: I, the ordering provider, attest that I have discussed with the patient the benefits, risks, side-effects, alternatives, likelihood of achieving goals, and potential problems during recovery for the procedure that I have provided informed consent. Date  Time: 01/08/2021  8:13 AM  Pre-Procedure Preparation:  Monitoring: As per clinic protocol. Respiration, ETCO2, SpO2, BP, heart rate and rhythm monitor placed and checked for adequate function Safety Precautions: Patient was assessed for positional comfort and pressure points before starting the procedure. Time-out: I initiated and conducted the "Time-out" before starting the procedure, as per protocol. The patient was asked to participate by confirming the accuracy of the "Time Out" information. Verification of the correct person, site, and procedure were performed and confirmed by me, the nursing staff, and the patient. "Time-out" conducted as per Joint Commission's Universal Protocol (UP.01.01.01). Time:  [id number]  Description of Procedure:          Laterality: Bilateral. The procedure was performed in identical fashion on both sides. Levels:  L2, L3, L4, L5, & S1 Medial Branch Level(s) Area Prepped: Posterior Lumbosacral Region DuraPrep (Iodine Povacrylex [0.7% available iodine] and Isopropyl Alcohol, 74% w/w) Safety Precautions: Aspiration looking for blood return was conducted prior to all injections. At no point did we inject any substances, as a needle was being advanced. Before injecting, the patient was told to  immediately notify me if she was experiencing any new onset of "ringing in the ears, or metallic taste in the mouth". No attempts were made at seeking any paresthesias. Safe injection practices and needle disposal techniques used. Medications properly checked for expiration dates. SDV (single dose vial) medications used. After the completion of the procedure, all disposable equipment used was discarded in the proper designated medical waste containers. Local Anesthesia: Protocol guidelines were followed. The patient was positioned over the fluoroscopy table. The area was prepped in the usual manner. The time-out was completed. The target area was identified using fluoroscopy. A 12-in long, straight, sterile hemostat was used with fluoroscopic guidance to locate the targets for each level blocked. Once located, the skin was marked with an approved surgical skin marker. Once all sites were marked, the skin (epidermis, dermis, and hypodermis), as well as deeper tissues (fat, connective tissue and muscle) were infiltrated with a small amount of a short-acting local anesthetic, loaded on a 10cc syringe with a 25G, 1.5-in  Needle. An appropriate amount of time was allowed for local anesthetics to take effect before proceeding to the next step. Local Anesthetic: Lidocaine 2.0% The unused portion of the local anesthetic was discarded in the proper designated containers. Technical explanation of process:   L2 Medial Branch Nerve Block (MBB): The target area for the L2 medial branch is at the junction of the postero-lateral aspect of the superior articular process and the superior, posterior, and medial edge of the transverse process of L3. Under fluoroscopic guidance, a Quincke needle was inserted until contact was made with os over the superior postero-lateral aspect of the pedicular shadow (target area). After negative aspiration for blood, 0.5 mL of the nerve block solution was injected without difficulty or complication. The needle was removed intact. L3 Medial Branch Nerve Block (MBB): The target area for the L3 medial branch is at the junction of the postero-lateral aspect of the superior articular process and the superior, posterior, and medial edge of the transverse process of L4. Under fluoroscopic guidance, a Quincke needle was inserted until contact was made with os over the superior postero-lateral aspect of the pedicular shadow (target area). After negative aspiration for blood, 0.5 mL of the nerve block solution was injected without difficulty or complication. The needle was removed intact. L4 Medial Branch Nerve Block (MBB): The target area for the L4 medial branch is at the junction of the postero-lateral aspect of the superior articular process and the superior, posterior, and medial edge of the transverse process of L5. Under fluoroscopic guidance, a Quincke needle was inserted until contact was made with os over the superior postero-lateral aspect of the pedicular shadow (target area). After negative aspiration for blood, 0.5 mL of the nerve block solution was injected without difficulty or complication. The needle was removed intact. L5 Medial Branch Nerve Block (MBB): The target area for the L5 medial branch is at the junction of the postero-lateral aspect of the superior articular process and the superior, posterior, and medial edge of the sacral ala. Under fluoroscopic guidance, a Quincke  needle was inserted until contact was made with os over the superior postero-lateral aspect of the pedicular shadow (target area). After negative aspiration for blood, 0.5 mL of the nerve block solution was injected without difficulty or complication. The needle was removed intact. S1 Medial Branch Nerve Block (MBB): The target area for the S1 medial branch is at the posterior and inferior 6 o'clock position of the L5-S1 facet joint. Under fluoroscopic guidance,  the Quincke needle inserted for the L5 MBB was redirected until contact was made with os over the inferior and postero aspect of the sacrum, at the 6 o' clock position under the L5-S1 facet joint (Target area). After negative aspiration for blood, 0.5 mL of the nerve block solution was injected without difficulty or complication. The needle was removed intact.  Nerve block solution: 0.2% PF-Ropivacaine + Triamcinolone (40 mg/mL) diluted to a final concentration of 4 mg of Triamcinolone/mL of Ropivacaine The unused portion of the solution was discarded in the proper designated containers. Procedural Needles: 22-gauge, 3.5-inch, Quincke needles used for all levels.  Once the entire procedure was completed, the treated area was cleaned, making sure to leave some of the prepping solution back to take advantage of its long term bactericidal properties.      Illustration of the posterior view of the lumbar spine and the posterior neural structures. Laminae of L2 through S1 are labeled. DPRL5, dorsal primary ramus of L5; DPRS1, dorsal primary ramus of S1; DPR3, dorsal primary ramus of L3; FJ, facet (zygapophyseal) joint L3-L4; I, inferior articular process of L4; LB1, lateral branch of dorsal primary ramus of L1; IAB, inferior articular branches from L3 medial branch (supplies L4-L5 facet joint); IBP, intermediate branch plexus; MB3, medial branch of dorsal primary ramus of L3; NR3, third lumbar nerve root; S, superior articular process of L5; SAB,  superior articular branches from L4 (supplies L4-5 facet joint also); TP3, transverse process of L3.  Vitals:   01/08/21 0850 01/08/21 0900 01/08/21 0910 01/08/21 0920  BP: 113/66 112/86 (!) 85/69 110/88  Pulse: (!) 58     Resp: 13 18 16 18   Temp:      TempSrc:      SpO2: 98% 96% 97% 97%  Weight:      Height:         Start Time: 0835 hrs. End Time: 0848 hrs.  Imaging Guidance (Spinal):          Type of Imaging Technique: Fluoroscopy Guidance (Spinal) Indication(s): Assistance in needle guidance and placement for procedures requiring needle placement in or near specific anatomical locations not easily accessible without such assistance. Exposure Time: Please see nurses notes. Contrast: None used. Fluoroscopic Guidance: I was personally present during the use of fluoroscopy. "Tunnel Vision Technique" used to obtain the best possible view of the target area. Parallax error corrected before commencing the procedure. "Direction-depth-direction" technique used to introduce the needle under continuous pulsed fluoroscopy. Once target was reached, antero-posterior, oblique, and lateral fluoroscopic projection used confirm needle placement in all planes. Images permanently stored in EMR. Interpretation: No contrast injected. I personally interpreted the imaging intraoperatively. Adequate needle placement confirmed in multiple planes. Permanent images saved into the patient's record.  Antibiotic Prophylaxis:   Anti-infectives (From admission, onward)   None     Indication(s): None identified  Post-operative Assessment:  Post-procedure Vital Signs:  Pulse/HCG Rate: (!) 58 (sb)(!) 58 Temp: (!) 97.3 F (36.3 C) Resp: 18 BP: 110/88 SpO2: 97 %  EBL: None  Complications: No immediate post-treatment complications observed by team, or reported by patient.  Note: The patient tolerated the entire procedure well. A repeat set of vitals were taken after the procedure and the patient was kept  under observation following institutional policy, for this type of procedure. Post-procedural neurological assessment was performed, showing return to baseline, prior to discharge. The patient was provided with post-procedure discharge instructions, including a section on how to identify potential problems. Should any problems arise  concerning this procedure, the patient was given instructions to immediately contact us, at any time, without hesitation. In any case, we plan to contact the patient by telephone for a follow-up status report regarding this interventional procedure.  Comments:  No additional relevant information.  Plan of Care  Orders:  Orders Placed This Encounter  Procedures  . LUMBAR FACET(MEDIAL BRANCH NERVE BLOCK) MBNB    Scheduling Instructions:     Procedure: Lumbar facet block (AKA.: Lumbosacral medial branch nerve block)     Side: Bilateral     Level: L3-4, L4-5, & L5-S1 Facets (L2, L3, L4, L5, & S1 Medial Branch Nerves)     Sedation: Patient's choice.     Timeframe: Today    Order Specific Question:   Where will this procedure be performed?    Answer:   [facility]  . [facility] C-ARM 1-60 MIN NO REPORT    Intraoperative interpretation by procedural physician at [facility].    Standing Status:   Standing    Number of Occurrences:   1    Order Specific Question:   Reason for exam:    Answer:   Assistance in needle guidance and placement for procedures requiring needle placement in or near specific anatomical locations not easily accessible without such assistance.  . Informed Consent Details: Physician/Practitioner Attestation; Transcribe to consent form and obtain patient signature    Nursing Order: Transcribe to consent form and obtain patient signature. Note: Always confirm laterality of pain with Ms. Kopec, before procedure.    Order Specific Question:   Physician/Practitioner attestation of informed consent for procedure/surgical case     Answer:   I, the physician/practitioner, attest that I have discussed with the patient the benefits, risks, side effects, alternatives, likelihood of achieving goals and potential problems during recovery for the procedure that I have provided informed consent.    Order Specific Question:   Procedure    Answer:   Lumbar Facet Block  under fluoroscopic guidance    Order Specific Question:   Physician/Practitioner performing the procedure    Answer:   Lasonya Hubner A. Dossie Arbour MD    Order Specific Question:   Indication/Reason    Answer:   Low Back Pain, with our without leg pain, due to Facet Joint Arthralgia (Joint Pain) Spondylosis (Arthritis of the Spine), without myelopathy or radiculopathy (Nerve Damage).  . Care order/instruction: Please confirm that the patient has stopped the Xarelto (Rivaroxaban) x 3 days prior to procedure or surgery.    Please confirm that the patient has stopped the Xarelto (Rivaroxaban) x 3 days prior to procedure or surgery.    Standing Status:   Standing    Number of Occurrences:   1  . Provide equipment / supplies at bedside    "Block Tray" (Disposable  single use) Needle type: SpinalSpinal Amount/quantity: 4 Size: Medium (5-inch) Gauge: 22G    Standing Status:   Standing    Number of Occurrences:   1    Order Specific Question:   Specify    Answer:   Block Tray  . Bleeding precautions    Standing Status:   Standing    Number of Occurrences:   1   Chronic Opioid Analgesic:  No opioid analgesics prescribed by our practice.  The patient received a letter from [facility] indicating that they would not be paying for her oxycodone prescriptions.   Medications ordered for procedure: Meds ordered this encounter  Medications  . lidocaine (XYLOCAINE) 2 % (with pres)  injection 400 mg  . lactated ringers infusion 1,000 mL  . midazolam (VERSED) 5 MG/5ML injection 1-2 mg    Make sure Flumazenil is available in the pyxis when using this medication. If oversedation occurs,  administer 0.2 mg IV over 15 sec. If after 45 sec no response, administer 0.2 mg again over 1 min; may repeat at 1 min intervals; not to exceed 4 doses (1 mg)  . fentaNYL (SUBLIMAZE) injection 25-50 mcg    Make sure Narcan is available in the pyxis when using this medication. In the event of respiratory depression (RR< 8/min): Titrate NARCAN (naloxone) in increments of 0.1 to 0.2 mg IV at 2-3 minute intervals, until desired degree of reversal.  . ropivacaine (PF) 2 mg/mL (0.2%) (NAROPIN) injection 18 mL  . triamcinolone acetonide (KENALOG-40) injection 80 mg   Medications administered: We administered lidocaine, lactated ringers, midazolam, fentaNYL, ropivacaine (PF) 2 mg/mL (0.2%), and triamcinolone acetonide.  See the medical record for exact dosing, route, and time of administration.  Follow-up plan:   Return in about 2 weeks (around 01/22/2021) for afternoon (VV) procedure day (PPE).       Interventional Therapies  Risk  Complexity Considerations:   Anticoagulation: Xarelto. (Stop: 3 days  Restart: 6 hrs)  Allergy: Iodine and radiological contrast  Implant: Internal cardiac defibrillator  History of noncompliance with post procedure follow-ups. (Prior patient of Dr. Mohammed Kindle) NOT a candidate for Lumbar RFA 2ry to [employer].    Planned  Pending:   Diagnostic intrathecal injection of opioid analgesic as an intrathecal pump trial.    Under consideration:   Diagnosticbilateral L5 TFESI #3  Possible intrathecal pump implant (09/03/2020 referral from Dr. Nelly Laurence office.  It reads: "Reason for referral: Consideration of morphine pump for chronic pain.  Dr. Lacinda Axon will place the permanent pump if the trial is successful."   Completed:   Therapeutic/palliative bilateral lumbar facet MBB x4 (11/08/2020) (100/100/100/100) Therapeutic/palliativebilateral L5 TFESI x2 (05/31/2019) Therapeutic right lumbar facet RFA x1 (02/07/2020) (do not repeat due to hardware)   Therapeutic   Palliative (PRN) options:   Palliative bilateral lumbar facet block #5  Palliative bilateral L5 TFESI #3     Recent Visits Date Type Provider Dept  11/22/20 Office Visit Milinda Pointer, MD [facility]  11/08/20 Procedure visit Milinda Pointer, MD [facility]  11/07/20 Telemedicine Milinda Pointer, MD [facility]  Showing recent visits within past 90 days and meeting all other requirements Today's Visits Date Type Provider Dept  01/08/21 Procedure visit Milinda Pointer, MD [facility]  Showing today's visits and meeting all other requirements Future Appointments Date Type Provider Dept  01/24/21 Appointment Milinda Pointer, MD [facility]  Showing future appointments within next 90 days and meeting all other requirements  Disposition: Discharge home  Discharge (Date  Time): 01/08/2021; 0920 hrs.   Primary Care Physician: [facility] Location: [facility] Note by: [name], MD Date: 01/08/2021; Time: 9:49 AM  Disclaimer:  Medicine is not an Chief Strategy Officer. The only guarantee in medicine is that nothing is guaranteed. It is important to note that the decision to proceed with this intervention was based on the information collected from the patient. The Data and conclusions were drawn from the patient's questionnaire, the interview, and the physical examination. Because the information was provided in large part by the patient, it cannot be guaranteed that it has not been purposely or unconsciously manipulated. Every effort has been made to obtain as  much relevant data as possible for this evaluation. It is important to note that the conclusions that lead to this procedure are derived in large part from the available data. Always take into account that the treatment will also be dependent on availability of resources and existing treatment guidelines, considered by other Pain  Management Practitioners as being common knowledge and practice, at the time of the intervention. For Medico-Legal purposes, it is also important to point out that variation in procedural techniques and pharmacological choices are the acceptable norm. The indications, contraindications, technique, and results of the above procedure should only be interpreted and judged by a Board-Certified Interventional Pain Specialist with extensive familiarity and expertise in the same exact procedure and technique.

## 2021-01-09 ENCOUNTER — Telehealth: Payer: Self-pay

## 2021-01-09 NOTE — Telephone Encounter (Signed)
Post procedure phone call. Patient states she is doing good.  

## 2021-01-23 ENCOUNTER — Encounter: Payer: Self-pay | Admitting: Pain Medicine

## 2021-01-23 NOTE — Progress Notes (Signed)
Patient: Claire Mcdonald  Service Category: E/M  Provider: [name], MD  DOB: 01-03-1966  DOS: 01/24/2021  Location: Office  MRN: [id number]  Setting: Ambulatory outpatient  Referring Provider: [facility]  Type: Established Patient  Specialty: Interventional Pain Management  PCP: [facility]  Location: Remote location  Delivery: TeleHealth     Virtual Encounter - Pain Management PROVIDER NOTE: Information contained herein reflects review and annotations entered in association with encounter. Interpretation of such information and data should be left to medically-trained personnel. Information provided to patient can be located elsewhere in the medical record under "Patient Instructions". Document created using STT-dictation technology, any transcriptional errors that may result from process are unintentional.    Contact & Pharmacy Preferred: [phone] Home: [phone] (home) Mobile: [phone] (mobile) E-mail: [name]  [place] [place] [facility] [place] [address] Phone: [phone] Fax: [phone]  [facility] - [place], [place] - [address] [place] [address] Phone: [phone] Fax: [phone]   Pre-screening  [name] offered "in-person" vs "virtual" encounter. She indicated preferring virtual for this encounter.   Reason COVID-19*  Social distancing based on CDC and AMA recommendations.   I contacted Claire Mcdonald on 01/24/2021 via telephone.      I clearly identified myself as [name], MD. I verified that I was speaking with the correct person using two identifiers (Name: Claire Mcdonald, and date of birth: [birth year]).  Consent I sought verbal advanced consent from Claire Mcdonald for virtual visit interactions. I informed Claire Mcdonald of possible security and privacy concerns, risks, and limitations associated with providing "not-in-person" medical  evaluation and management services. I also informed Claire Mcdonald of the availability of "in-person" appointments. Finally, I informed her that there would be a charge for the virtual visit and that she could be  personally, fully or partially, financially responsible for it. Claire Mcdonald expressed understanding and agreed to proceed.   Historic Elements   Claire Mcdonald is a 55 y.o. year old, female patient evaluated today after our last contact on 01/08/2021. 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 She also  has a past surgical history that includes Tubal ligation (1998); Ovarian cyst removal (Left); central line (03/25/2015); Hand surgery (Left); Cardiac catheterization (N/A, 03/27/2015); Cardiac catheterization (N/A, 04/11/2015); Cardiac catheterization (N/A, 07/16/2015); Colonoscopy with propofol (N/A, 10/01/2015); Esophagogastroduodenoscopy (egd) with propofol (N/A, 10/01/2015); Abdominal hysterectomy; and Hip Arthroplasty (Right, 02/27/2020). Claire Mcdonald has a current medication list which includes the following prescription(s): acetaminophen, albuterol, atorvastatin, vitamin d3, cholestyramine, diclofenac sodium, dicyclomine, furosemide, gabapentin, hydroxyzine, magnesium, methocarbamol, metoprolol tartrate, polyethylene glycol, potassium chloride sa, promethazine, rivaroxaban, senna-docusate, sertraline, topiramate, triamcinolone, azelastine, gnp calcium 1200, cetirizine, famotidine, and fluticasone. She  reports that she has never smoked. She has never used smokeless tobacco. She reports that she does not drink alcohol and  does not use drugs. Claire Mcdonald is allergic to tape, iodinated diagnostic agents, toradol [ketorolac tromethamine], tramadol, and zofran [ondansetron hcl].   HPI  Today, she is being contacted for a post-procedure assessment.  The patient indicates having attained excellent benefit from the bilateral lumbar facet block.  She  is currently enjoying an ongoing 100% relief of her pain.  She indicates not needing anything else at this point.  Because of the patient's fusion hardware, she is not a good candidate for radiofrequency ablation.  Post-Procedure Evaluation  Procedure (01/08/2021): Therapeutic/palliative bilateral lumbar facet block #5 under fluoroscopic guidance and IV sedation Pre-procedure pain level: 9/10 Post-procedure: 0/10 (100% relief)  Sedation: Sedation provided.  Effectiveness during initial hour after procedure(Ultra-Short Term Relief): 100 %.  Local anesthetic used: Long-acting (4-6 hours) Effectiveness: Defined as any analgesic benefit obtained secondary to the administration of local anesthetics. This carries significant diagnostic value as to the etiological location, or anatomical origin, of the pain. Duration of benefit is expected to coincide with the duration of the local anesthetic used.  Effectiveness during initial 4-6 hours after procedure(Short-Term Relief): 100 %.  Long-term benefit: Defined as any relief past the pharmacologic duration of the local anesthetics.  Effectiveness past the initial 6 hours after procedure(Long-Term Relief): 100 %.  Current benefits: Defined as benefit that persist at this time.   Analgesia:  The patient is currently enjoying an ongoing 100% relief of her low back pain. Function: Claire Mcdonald reports improvement in function ROM: Claire Mcdonald reports improvement in ROM  Pharmacotherapy Assessment  Analgesic: No opioid analgesics prescribed by our practice.  The patient received a letter from [facility] indicating that they would not be paying for  her oxycodone prescriptions.   Monitoring: [place] PMP: PDMP reviewed during this encounter.       Pharmacotherapy: No side-effects or adverse reactions reported. Compliance: No problems identified. Effectiveness: Clinically acceptable. Plan: Refer to "POC".  UDS: No results found for: SUMMARY  Laboratory Chemistry Profile   Renal Lab Results  Component Value Date   BUN 15 03/01/2020   CREATININE 0.68 03/01/2020   BCR 21 04/16/2015   GFRAA >60 03/01/2020   GFRNONAA >60 03/01/2020     Hepatic Lab Results  Component Value Date   AST 28 02/25/2020   ALT 29 02/25/2020   ALBUMIN 3.9 02/25/2020   ALKPHOS 125 02/25/2020   LIPASE 29 03/27/2019     Electrolytes Lab Results  Component Value Date   NA 140 03/01/2020   K 3.7 03/01/2020   CL 107 03/01/2020   CALCIUM 8.2 (L) 03/01/2020   MG 2.3 02/27/2020   PHOS 3.8 02/19/2017     Bone Lab Results  Component Value Date   VD25OH 19.3 (L) 11/30/2017     Inflammation (CRP: Acute Phase) (ESR: Chronic Phase) Lab Results  Component Value Date   CRP <0.8 11/30/2017   ESRSEDRATE 19 11/30/2017   LATICACIDVEN 0.8 03/26/2015       Note: Above Lab results reviewed.  Imaging  DG PAIN CLINIC C-ARM 1-60 MIN NO REPORT Fluoro was used, but no Radiologist interpretation will be provided.  Please refer to "NOTES" tab for provider progress note.  Assessment  The primary encounter diagnosis was Lumbar facet syndrome (Bilateral) (R>L). 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  Plan of Care  Problem-specific:  No problem-specific Assessment & Plan notes found for this  encounter.  Ms. MERSADIES PETREE has a current medication list which includes the following long-term medication(s): albuterol, atorvastatin, cholestyramine, furosemide,  metoprolol tartrate, rivaroxaban, topiramate, azelastine, gnp calcium 1200, cetirizine, famotidine, and fluticasone.  Pharmacotherapy (Medications Ordered): No orders of the defined types were placed in this encounter.  Orders:  No orders of the defined types were placed in this encounter.  Follow-up plan:   Return if symptoms worsen or fail to improve.      Interventional Therapies  Risk  Complexity Considerations:   Anticoagulation: Xarelto. (Stop: 3 days  Restart: 6 hrs)  Allergy: Iodine and radiological contrast  Implant: Internal cardiac defibrillator  History of noncompliance with post procedure follow-ups. (Prior patient of Dr. Mohammed Kindle) NOT a candidate for Lumbar RFA 2ry to Fusion Hardware.    Planned  Pending:   Diagnostic intrathecal injection of opioid analgesic as an intrathecal pump trial.    Under consideration:   Diagnosticbilateral L5 TFESI #3  Possible intrathecal pump implant (09/03/2020 referral from Dr. Nelly Laurence office.  It reads: "Reason for referral: Consideration of morphine pump for chronic pain.  Dr. Lacinda Axon will place the permanent pump if the trial is successful."   Completed:   Therapeutic/palliative bilateral lumbar facet MBB x4 (11/08/2020) (100/100/100/100) Therapeutic/palliativebilateral L5 TFESI x2 (05/31/2019) Therapeutic right lumbar facet RFA x1 (02/07/2020) (do not repeat due to hardware)   Therapeutic  Palliative (PRN) options:   Palliative bilateral lumbar facet block #5  Palliative bilateral L5 TFESI #3      Recent Visits Date Type Provider Dept  01/08/21 Procedure visit Milinda Pointer, MD [facility]  11/22/20 Office Visit Milinda Pointer, MD [facility]  11/08/20 Procedure visit Milinda Pointer, MD [facility]  11/07/20  Telemedicine Milinda Pointer, MD [facility]  Showing recent visits within past 90 days and meeting all other requirements Today's Visits Date Type Provider Dept  01/24/21 Telemedicine Milinda Pointer, MD [facility]  Showing today's visits and meeting all other requirements Future Appointments No visits were found meeting these conditions. Showing future appointments within next 90 days and meeting all other requirements  I discussed the assessment and treatment plan with the patient. The patient was provided an opportunity to ask questions and all were answered. The patient agreed with the plan and demonstrated an understanding of the instructions.  Patient advised to call back or seek an in-person evaluation if the symptoms or condition worsens.  Duration of encounter: 12 minutes.  Note by: [name], MD Date: 01/24/2021; Time: 5:56 PM

## 2021-01-24 ENCOUNTER — Ambulatory Visit: Payer: Medicaid Other | Attending: Pain Medicine | Admitting: Pain Medicine

## 2021-01-24 ENCOUNTER — Other Ambulatory Visit: Payer: Self-pay

## 2021-01-24 DIAGNOSIS — M47817 Spondylosis without myelopathy or radiculopathy, lumbosacral region: Secondary | ICD-10-CM | POA: Diagnosis not present

## 2021-01-24 DIAGNOSIS — M545 Low back pain, unspecified: Secondary | ICD-10-CM

## 2021-01-24 DIAGNOSIS — M5442 Lumbago with sciatica, left side: Secondary | ICD-10-CM

## 2021-01-24 DIAGNOSIS — M4310 Spondylolisthesis, site unspecified: Secondary | ICD-10-CM

## 2021-01-24 DIAGNOSIS — M5441 Lumbago with sciatica, right side: Secondary | ICD-10-CM

## 2021-01-24 DIAGNOSIS — M79605 Pain in left leg: Secondary | ICD-10-CM

## 2021-01-24 DIAGNOSIS — G8929 Other chronic pain: Secondary | ICD-10-CM

## 2021-01-24 DIAGNOSIS — M79604 Pain in right leg: Secondary | ICD-10-CM

## 2021-01-24 DIAGNOSIS — M47816 Spondylosis without myelopathy or radiculopathy, lumbar region: Secondary | ICD-10-CM | POA: Diagnosis not present

## 2021-01-28 DIAGNOSIS — K580 Irritable bowel syndrome with diarrhea: Secondary | ICD-10-CM | POA: Insufficient documentation

## 2021-01-28 DIAGNOSIS — M6289 Other specified disorders of muscle: Secondary | ICD-10-CM | POA: Insufficient documentation

## 2021-01-28 DIAGNOSIS — K5790 Diverticulosis of intestine, part unspecified, without perforation or abscess without bleeding: Secondary | ICD-10-CM | POA: Insufficient documentation

## 2021-02-07 ENCOUNTER — Encounter: Payer: Self-pay | Admitting: Pain Medicine

## 2021-02-11 ENCOUNTER — Other Ambulatory Visit: Payer: Self-pay

## 2021-02-11 ENCOUNTER — Ambulatory Visit: Payer: Medicaid Other | Attending: Pain Medicine | Admitting: Pain Medicine

## 2021-02-11 DIAGNOSIS — M545 Low back pain, unspecified: Secondary | ICD-10-CM | POA: Diagnosis not present

## 2021-02-11 DIAGNOSIS — G8929 Other chronic pain: Secondary | ICD-10-CM

## 2021-02-11 DIAGNOSIS — Z7901 Long term (current) use of anticoagulants: Secondary | ICD-10-CM

## 2021-02-11 DIAGNOSIS — M4310 Spondylolisthesis, site unspecified: Secondary | ICD-10-CM

## 2021-02-11 DIAGNOSIS — M47816 Spondylosis without myelopathy or radiculopathy, lumbar region: Secondary | ICD-10-CM

## 2021-02-11 DIAGNOSIS — M961 Postlaminectomy syndrome, not elsewhere classified: Secondary | ICD-10-CM | POA: Diagnosis not present

## 2021-02-11 DIAGNOSIS — M5136 Other intervertebral disc degeneration, lumbar region: Secondary | ICD-10-CM

## 2021-02-11 NOTE — Patient Instructions (Addendum)
____________________________________________________________________________________________  Preparing for Procedure with Sedation  Procedure appointments are limited to planned procedures: No Prescription Refills. No disability issues will be discussed. No medication changes will be discussed.  Instructions: Oral Intake: Do not eat or drink anything for at least 8 hours prior to your procedure. (Exception: Blood Pressure Medication. See below.) Transportation: Unless otherwise stated by your physician, you may drive yourself after the procedure. Blood Pressure Medicine: Do not forget to take your blood pressure medicine with a sip of water the morning of the procedure. If your Diastolic (lower reading)is above 100 mmHg, elective cases will be cancelled/rescheduled. Blood thinners: These will need to be stopped for procedures. Notify our staff if you are taking any blood thinners. Depending on which one you take, there will be specific instructions on how and when to stop it. Diabetics on insulin: Notify the staff so that you can be scheduled 1st case in the morning. If your diabetes requires high dose insulin, take only  of your normal insulin dose the morning of the procedure and notify the staff that you have done so. Preventing infections: Shower with an antibacterial soap the morning of your procedure. Build-up your immune system: Take 1000 mg of Vitamin C with every meal (3 times a day) the day prior to your procedure. Antibiotics: Inform the staff if you have a condition or reason that requires you to take antibiotics before dental procedures. Pregnancy: If you are pregnant, call and cancel the procedure. Sickness: If you have a cold, fever, or any active infections, call and cancel the procedure. Arrival: You must be in the facility at least 30 minutes prior to your scheduled procedure. Children: Do not bring children with you. Dress appropriately: Bring dark clothing that you would  not mind if they get stained. Valuables: Do not bring any jewelry or valuables.  Reasons to call and reschedule or cancel your procedure: (Following these recommendations will minimize the risk of a serious complication.) Surgeries: Avoid having procedures within 2 weeks of any surgery. (Avoid for 2 weeks before or after any surgery). Flu Shots: Avoid having procedures within 2 weeks of a flu shots or . (Avoid for 2 weeks before or after immunizations). Barium: Avoid having a procedure within 7-10 days after having had a radiological study involving the use of radiological contrast. (Myelograms, Barium swallow or enema study). Heart attacks: Avoid any elective procedures or surgeries for the initial 6 months after a "Myocardial Infarction" (Heart Attack). Blood thinners: It is imperative that you stop these medications before procedures. Let us know if you if you take any blood thinner.  Infection: Avoid procedures during or within two weeks of an infection (including chest colds or gastrointestinal problems). Symptoms associated with infections include: Localized redness, fever, chills, night sweats or profuse sweating, burning sensation when voiding, cough, congestion, stuffiness, runny nose, sore throat, diarrhea, nausea, vomiting, cold or Flu symptoms, recent or current infections. It is specially important if the infection is over the area that we intend to treat. Heart and lung problems: Symptoms that may suggest an active cardiopulmonary problem include: cough, chest pain, breathing difficulties or shortness of breath, dizziness, ankle swelling, uncontrolled high or unusually low blood pressure, and/or palpitations. If you are experiencing any of these symptoms, cancel your procedure and contact your primary care physician for an evaluation.  Remember:  Regular Business hours are:  Monday to Thursday 8:00 AM to 4:00 PM  Provider's Schedule: Jaymison Luber, MD:  Procedure days: Tuesday and  Thursday 7:30 AM   to 4:00 PM  Bilal Lateef, MD:  Procedure days: Monday and Wednesday 7:30 AM to 4:00 PM ____________________________________________________________________________________________  ____________________________________________________________________________________________  General Risks and Possible Complications  Patient Responsibilities: It is important that you read this as it is part of your informed consent. It is our duty to inform you of the risks and possible complications associated with treatments offered to you. It is your responsibility as a patient to read this and to ask questions about anything that is not clear or that you believe was not covered in this document.  Patient's Rights: You have the right to refuse treatment. You also have the right to change your mind, even after initially having agreed to have the treatment done. However, under this last option, if you wait until the last second to change your mind, you may be charged for the materials used up to that point.  Introduction: Medicine is not an exact science. Everything in Medicine, including the lack of treatment(s), carries the potential for danger, harm, or loss (which is by definition: Risk). In Medicine, a complication is a secondary problem, condition, or disease that can aggravate an already existing one. All treatments carry the risk of possible complications. The fact that a side effects or complications occurs, does not imply that the treatment was conducted incorrectly. It must be clearly understood that these can happen even when everything is done following the highest safety standards.  No treatment: You can choose not to proceed with the proposed treatment alternative. The "PRO(s)" would include: avoiding the risk of complications associated with the therapy. The "CON(s)" would include: not getting any of the treatment benefits. These benefits fall under one of three categories: diagnostic;  therapeutic; and/or palliative. Diagnostic benefits include: getting information which can ultimately lead to improvement of the disease or symptom(s). Therapeutic benefits are those associated with the successful treatment of the disease. Finally, palliative benefits are those related to the decrease of the primary symptoms, without necessarily curing the condition (example: decreasing the pain from a flare-up of a chronic condition, such as incurable terminal cancer).  General Risks and Complications: These are associated to most interventional treatments. They can occur alone, or in combination. They fall under one of the following six (6) categories: no benefit or worsening of symptoms; bleeding; infection; nerve damage; allergic reactions; and/or death. No benefits or worsening of symptoms: In Medicine there are no guarantees, only probabilities. No healthcare provider can ever guarantee that a medical treatment will work, they can only state the probability that it may. Furthermore, there is always the possibility that the condition may worsen, either directly, or indirectly, as a consequence of the treatment. Bleeding: This is more common if the patient is taking a blood thinner, either prescription or over the counter (example: Goody Powders, Fish oil, Aspirin, Garlic, etc.), or if suffering a condition associated with impaired coagulation (example: Hemophilia, cirrhosis of the liver, low platelet counts, etc.). However, even if you do not have one on these, it can still happen. If you have any of these conditions, or take one of these drugs, make sure to notify your treating physician. Infection: This is more common in patients with a compromised immune system, either due to disease (example: diabetes, cancer, human immunodeficiency virus [HIV], etc.), or due to medications or treatments (example: therapies used to treat cancer and rheumatological diseases). However, even if you do not have one on  these, it can still happen. If you have any of these conditions, or take one of   these drugs, make sure to notify your treating physician. Nerve Damage: This is more common when the treatment is an invasive one, but it can also happen with the use of medications, such as those used in the treatment of cancer. The damage can occur to small secondary nerves, or to large primary ones, such as those in the spinal cord and brain. This damage may be temporary or permanent and it may lead to impairments that can range from temporary numbness to permanent paralysis and/or brain death. Allergic Reactions: Any time a substance or material comes in contact with our body, there is the possibility of an allergic reaction. These can range from a mild skin rash (contact dermatitis) to a severe systemic reaction (anaphylactic reaction), which can result in death. Death: In general, any medical intervention can result in death, most of the time due to an unforeseen complication. ____________________________________________________________________________________________   ____________________________________________________________________________________________  Blood Thinners  IMPORTANT NOTICE:  If you take any of these, make sure to notify the nursing staff.  Failure to do so may result in injury.  Recommended time intervals to stop and restart blood-thinners, before & after invasive procedures  Generic Name Brand Name Pre-procedure. Stop this long before procedure. Post-procedure. Minimum waiting period before restarting.  Abciximab Reopro 15 days 2 hrs  Alteplase Activase 10 days 10 days  Anagrelide Agrylin    Apixaban Eliquis 3 days 6 hrs  Cilostazol Pletal 3 days 5 hrs  Clopidogrel Plavix 7-10 days 2 hrs  Dabigatran Pradaxa 5 days 6 hrs  Dalteparin Fragmin 24 hours 4 hrs  Dipyridamole Aggrenox 11days 2 hrs  Edoxaban L[place]iana; Savaysa 3 days 2 hrs  Enoxaparin  Lovenox 24 hours 4 hrs  Eptifibatide  Integrillin 8 hours 2 hrs  Fondaparinux  Arixtra 72 hours 12 hrs  Hydroxychloroquine Plaquenil 11 days   Prasugrel Effient 7-10 days 6 hrs  Reteplase Retavase 10 days 10 days  Rivaroxaban Xarelto 3 days 6 hrs  Ticagrelor Brilinta 5-7 days 6 hrs  Ticlopidine Ticlid 10-14 days 2 hrs  Tinzaparin Innohep 24 hours 4 hrs  Tirofiban Aggrastat 8 hours 2 hrs  Warfarin Coumadin 5 days 2 hrs   Other medications with blood-thinning effects  Product indications Generic (Brand) names Note  Cholesterol Lipitor Stop 4 days before procedure  Blood thinner (injectable) Heparin (LMW or LMWH Heparin) Stop 24 hours before procedure  Cancer Ibrutinib (Imbruvica) Stop 7 days before procedure  Malaria/Rheumatoid Hydroxychloroquine (Plaquenil) Stop 11 days before procedure  Thrombolytics  10 days before or after procedures   Over-the-counter (OTC) Products with blood-thinning effects  Product Common names Stop Time  Aspirin > 325 mg Goody Powders, Excedrin, etc. 11 days  Aspirin ? 81 mg  7 days  Fish oil  4 days  Garlic supplements  7 days  Ginkgo biloba  36 hours  Ginseng  24 hours  NSAIDs Ibuprofen, Naprosyn, etc. 3 days  Vitamin E  4 days   ____________________________________________________________________________________________

## 2021-02-11 NOTE — Progress Notes (Signed)
Patient: Claire Mcdonald  Service Category: E/M  Provider: [name], MD  DOB: [birth year]  DOS: 02/11/2021  Location: Office  MRN: [id number]  Setting: Ambulatory outpatient  Referring Provider: [facility]  Type: Established Patient  Specialty: Interventional Pain Management  PCP: [facility]  Location: Remote location  Delivery: TeleHealth     Virtual Encounter - Pain Management PROVIDER NOTE: Information contained herein reflects review and annotations entered in association with encounter. Interpretation of such information and data should be left to medically-trained personnel. Information provided to patient can be located elsewhere in the medical record under "Patient Instructions". Document created using STT-dictation technology, any transcriptional errors that may result from process are unintentional.    Contact & Pharmacy Preferred: [phone] Home: [phone] (home) Mobile: [phone] (mobile) E-mail: [name] .[employer] [place] - [place] [address] Phone: [phone] Fax: [phone]  [facility] - [place], [place] - [address] [place]  [address] Phone: [phone] Fax: [phone]   Pre-screening  [name] offered "in-person" vs "virtual" encounter. She indicated preferring virtual for this encounter.   Reason COVID-19*  Social distancing based on CDC and AMA recommendations.   I contacted DAWANNA GRAUBERGER on 02/11/2021 via telephone.      I clearly identified myself as [name], MD. I verified that I was speaking with the correct person using two identifiers (Name: Claire Mcdonald, and date of birth: 10/31/1965).  Consent I sought verbal advanced consent from [name] for virtual visit interactions. I informed [name] of possible security and privacy concerns, risks, and limitations associated with providing "not-in-person"  medical evaluation and management services. I also informed Ms. Koeneman of the availability of "in-person" appointments. Finally, I informed her that there would be a charge for the virtual visit and that she could be  personally, fully or partially, financially responsible for it. Ms. Gregg expressed understanding and agreed to proceed.   Historic Elements   Claire Mcdonald is a 55 y.o. year old, female patient evaluated today after our last contact on 01/08/2021. 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 She also  has a past surgical history that includes Tubal ligation (1998); Ovarian cyst removal (Left); central line (03/25/2015); Hand surgery (Left); Cardiac catheterization (N/A, 03/27/2015); Cardiac catheterization (N/A, 04/11/2015); Cardiac catheterization (N/A, 07/16/2015); Colonoscopy with propofol (N/A, 10/01/2015); Esophagogastroduodenoscopy (egd) with propofol (N/A, 10/01/2015); Abdominal hysterectomy; and Hip Arthroplasty (Right, 02/27/2020). [name] has a current medication list which includes the following prescription(s): acetaminophen, albuterol, atorvastatin, vitamin d3, cholestyramine, diclofenac sodium, dicyclomine, famotidine, furosemide, gabapentin, hydroxyzine, magnesium, metoprolol tartrate, ofloxacin, polyethylene glycol, potassium chloride sa, promethazine, rivaroxaban, senna-docusate, sertraline, topiramate, triamcinolone, azelastine, gnp calcium 1200, cetirizine, famotidine, and fluticasone. She  reports that she has never smoked. She has never used smokeless tobacco. She reports that she does not drink  alcohol and does not use drugs. [name] is allergic to tape, iodinated diagnostic agents, toradol [ketorolac tromethamine], tramadol, and zofran [ondansetron hcl].   HPI  Today, she is being contacted for worsening of previously known (established) problem.  The patient indicates that her low back pain has returned and  it is again referring pain down the back of the legs to the level of the knee.  No pain, numbness, or weakness below the knee.  She would like to have the procedure that we did on 01/08/2021 repeated (bilateral lumbar facet block).  Today I have reviewed for the patient her alternatives and she indicates that she does not feel ready for any type of implant.  Pharmacotherapy Assessment  Analgesic: No opioid analgesics prescribed by our practice.  The patient received a letter from [facility] indicating that they would not be paying for her oxycodone prescriptions.   Monitoring: [place] PMP: PDMP reviewed during this encounter.       Pharmacotherapy: No side-effects or adverse reactions reported. Compliance: No problems identified. Effectiveness: Clinically acceptable. Plan: Refer to "POC".  UDS: No results found for: SUMMARY  Laboratory Chemistry Profile   Renal Lab Results  Component Value Date   BUN 15 03/01/2020   CREATININE 0.68 03/01/2020   BCR 21 04/16/2015   GFRAA >60 03/01/2020   GFRNONAA >60 03/01/2020     Hepatic Lab Results  Component Value Date   AST 28 02/25/2020   ALT 29 02/25/2020   ALBUMIN 3.9 02/25/2020   ALKPHOS 125 02/25/2020   LIPASE 29 03/27/2019     Electrolytes Lab Results  Component Value Date   NA 140 03/01/2020   K 3.7 03/01/2020   CL 107 03/01/2020   CALCIUM 8.2 (L) 03/01/2020   MG 2.3 02/27/2020   PHOS 3.8 02/19/2017     Bone Lab Results  Component Value Date   VD25OH 19.3 (L) 11/30/2017     Inflammation (CRP: Acute Phase) (ESR: Chronic Phase) Lab Results  Component Value Date   CRP <0.8 11/30/2017   ESRSEDRATE 19 11/30/2017    LATICACIDVEN 0.8 03/26/2015       Note: Above Lab results reviewed.  Imaging  DG PAIN CLINIC C-ARM 1-60 MIN NO REPORT Fluoro was used, but no Radiologist interpretation will be provided.  Please refer to "NOTES" tab for provider progress note.  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 Diagnoses of Lumbar facet syndrome (Bilateral) (R>L), Lumbar facet arthropathy, Failed back surgical syndrome, DDD (degenerative disc disease), lumbar, Grade 2-3 (13-16 mm) Anterolisthesis of L5 over S1 (06/22/2019), and Chronic anticoagulation (Xarelto) were also pertinent to this visit.  Plan of Care  Problem-specific:  No problem-specific Assessment & Plan notes found for this encounter.  Ms. LEVETTE PAULICK has a current medication list which includes the following long-term medication(s): albuterol, atorvastatin, cholestyramine, famotidine, furosemide, metoprolol tartrate, rivaroxaban, topiramate, azelastine, gnp calcium 1200, cetirizine, famotidine, and fluticasone.  Pharmacotherapy (Medications Ordered): No orders of the defined types were placed in this encounter.  Orders:  Orders Placed This Encounter  Procedures   LUMBAR FACET(MEDIAL BRANCH NERVE BLOCK) MBNB    Standing Status:   Future    Standing Expiration Date:   03/13/2021    Scheduling Instructions:     Procedure: Lumbar facet block (AKA.: Lumbosacral medial branch nerve block)     Side: Bilateral     Level: L3-4, L4-5, & L5-S1 Facets (L2, L3, L4, L5, & S1 Medial Branch Nerves)     Sedation: Patient's choice.     Timeframe: ASAA    Order Specific Question:   Where will this procedure be performed?    Answer:   [facility]   Blood Thinner Instructions to Nursing    Always make sure patient has clearance from prescribing physician  to stop blood thinners for interventional therapies. If the patient requires a Lovenox-bridge therapy, make sure arrangements are made to  institute it with the assistance of the PCP.    Scheduling Instructions:     Have Ms. Krouse stop the Xarelto (Rivaroxaban) x 3 days prior to procedure or surgery.    Follow-up plan:   Return for Procedure (w/ sedation): (B) L-FCT BLK , (Blood Thinner Protocol).     Interventional Therapies  Risk  Complexity Considerations:   Anticoagulation: Xarelto. (Stop: 3 days  Restart: 6 hrs)  Allergy: Iodine and radiological contrast  Implant: Internal cardiac defibrillator  History of noncompliance with post procedure follow-ups. (Prior patient of Dr. Mohammed Kindle) NOT a candidate for Lumbar RFA 2ry to [employer].    Planned  Pending:   Diagnostic intrathecal injection of opioid analgesic as an intrathecal pump trial.    Under consideration:   Diagnostic bilateral L5 TFESI #3  Patient referred to [place] by Dr. Cari Caraway for intrathecal pump trial and possible implant by Dr. Lacinda Axon.  However, the patient has stopped this process indicating that she is not ready for an implant.   Completed:   Therapeutic/palliative bilateral lumbar facet MBB x5 (01/08/2021) (100/100/100/100) Therapeutic/palliative bilateral L5 TFESI x2 (05/31/2019) Therapeutic right lumbar facet RFA x1 (02/07/2020) (DO NOT REPEAT - FUSION hardware)   Therapeutic  Palliative (PRN) options:   Palliative bilateral lumbar facet block #5  Palliative bilateral L5 TFESI #3     Recent Visits Date Type Provider Dept  01/24/21 Telemedicine Milinda Pointer, MD [facility]  01/08/21 Procedure visit Milinda Pointer, MD [facility]  11/22/20 Office Visit Milinda Pointer, MD [facility]  Showing recent visits within past 90 days and meeting all other requirements Today's Visits Date Type Provider Dept  02/11/21 Telemedicine Milinda Pointer, MD [facility]  Showing today's visits and meeting all other requirements Future Appointments No visits were found meeting these  conditions. Showing future appointments within next 90 days and meeting all other requirements I discussed the assessment and treatment plan with the patient. The patient was provided an opportunity to ask questions and all were answered. The patient agreed with the plan and demonstrated an understanding of the instructions.  Patient advised to call back or seek an in-person evaluation if the symptoms or condition worsens.  Duration of encounter: 16 minutes.  Note by: [name], MD Date: 02/11/2021; Time: 4:34 PM

## 2021-03-04 NOTE — Progress Notes (Signed)
PROVIDER NOTE: Information contained herein reflects review and annotations entered in association with encounter. Interpretation of such information and data should be left to medically-trained personnel. Information provided to patient can be located elsewhere in the medical record under "Patient Instructions". Document created using STT-dictation technology, any transcriptional errors that may result from process are unintentional.    Patient: Claire Mcdonald  Service Category: Procedure  Provider: [name], MD  DOB: [birth year]  DOS: 03/05/2021  Location: [facility]  MRN: [id number]  Setting: Ambulatory - outpatient  Referring Provider: Milinda Pointer, MD  Type: Established Patient  Specialty: Interventional Pain Management  PCP: [facility] [place]   Primary Reason for Visit: Interventional Pain Management Treatment. CC: Knee Pain (bilateral)  Procedure:          Anesthesia, Analgesia, Anxiolysis:  Type: Lumbar Facet, Medial Branch Block(s)          Primary Purpose: Diagnostic Region: Posterolateral Lumbosacral Spine Level: L2, L3, L4, L5, & S1 Medial Branch Level(s). Injecting these levels blocks the L3-4, L4-5, and L5-S1 lumbar facet joints. Laterality: Bilateral  Type: Minimal (Conscious) Anxiolysis combined with Local Anesthesia Indication(s): Analgesia and Anxiety Route: Intravenous (IV) IV Access: Secured Sedation: Meaningful verbal contact was maintained at all times during the procedure  Local Anesthetic: Lidocaine 1-2%  Position: Prone   Indications: 1. Lumbar facet syndrome (Bilateral) (R>L)   2. Lumbar facet arthropathy   3. Grade 2-3 (13-16 mm) Anterolisthesis of L5 over S1 (06/22/2019)   4. Spondylosis without myelopathy or radiculopathy, lumbosacral region   5. Pars defect of lumbar spine   6. Spinal instability of lumbosacral region (L5-S1)   7. Failed back surgical syndrome   8. Chronic low back pain (Bilateral) w/o sciatica   9.  Bilateral hip pain   10. Chronic anticoagulation (Xarelto)   11. History of allergy to radiographic contrast media   12. History of Allergy to iodine   13. History of implantable cardioverter-defibrillator (ICD) insertion    Pain Score: Pre-procedure: 9 /10 Post-procedure: 0-No pain/10   Pre-op H&P Assessment:  [name] is a 55 y.o. (year old), female patient, seen today for interventional treatment. She  has a past surgical history that includes Tubal ligation (1998); Ovarian cyst removal (Left); central line (03/25/2015); Hand surgery (Left); Cardiac catheterization (N/A, 03/27/2015); Cardiac catheterization (N/A, 04/11/2015); Cardiac catheterization (N/A, 07/16/2015); Colonoscopy with propofol (N/A, 10/01/2015); Esophagogastroduodenoscopy (egd) with propofol (N/A, 10/01/2015); Abdominal hysterectomy; and Hip Arthroplasty (Right, 02/27/2020). [name] has a current medication list which includes the following prescription(s): acetaminophen, albuterol, atorvastatin, azelastine, gnp calcium 1200, cetirizine, vitamin d3, cholestyramine, diclofenac sodium, dicyclomine, famotidine, fluticasone, furosemide, gabapentin, hydroxyzine, magnesium, metoprolol tartrate, polyethylene glycol, potassium chloride sa, promethazine, rivaroxaban, senna-docusate, sertraline, topiramate, triamcinolone, and famotidine, and the following Facility-Administered Medications: midazolam. Her primarily concern today is the Knee Pain (bilateral)  Initial Vital Signs:  Pulse/HCG Rate: 68ECG Heart Rate: (!) 57 Temp: (!) 96.6 F (35.9 C) Resp: 16 BP: (!) 150/88 SpO2: 97 %  BMI: Estimated body mass index is 32.19 kg/m as calculated from the following:   Height as of this encounter: 5\' 2"  (1.575 m).   Weight as of this encounter: 176 lb (79.8 kg).  Risk Assessment: Allergies: Reviewed. She is allergic to tape, iodinated diagnostic agents, toradol [ketorolac tromethamine], tramadol, and zofran [ondansetron hcl].  Allergy  Precautions: None required Coagulopathies: Reviewed. None identified.  Blood-thinner therapy: None at this time Active Infection(s): Reviewed. None identified. [name] is afebrile  Site Confirmation: [name] was  asked to confirm the procedure and laterality before marking the site Procedure checklist: Completed Consent: Before the procedure and under the influence of no sedative(s), amnesic(s), or anxiolytics, the patient was informed of the treatment options, risks and possible complications. To fulfill our ethical and legal obligations, as recommended by the American Medical Association's Code of Ethics, I have informed the patient of my clinical impression; the nature and purpose of the treatment or procedure; the risks, benefits, and possible complications of the intervention; the alternatives, including doing nothing; the risk(s) and benefit(s) of the alternative treatment(s) or procedure(s); and the risk(s) and benefit(s) of doing nothing. The patient was provided information about the general risks and possible complications associated with the procedure. These may include, but are not limited to: failure to achieve desired goals, infection, bleeding, organ or nerve damage, allergic reactions, paralysis, and death. In addition, the patient was informed of those risks and complications associated to Spine-related procedures, such as failure to decrease pain; infection (i.e.: Meningitis, epidural or intraspinal abscess); bleeding (i.e.: epidural hematoma, subarachnoid hemorrhage, or any other type of intraspinal or peri-dural bleeding); organ or nerve damage (i.e.: Any type of peripheral nerve, nerve root, or spinal cord injury) with subsequent damage to sensory, motor, and/or autonomic systems, resulting in permanent pain, numbness, and/or weakness of one or several areas of the body; allergic reactions; (i.e.: anaphylactic reaction); and/or death. Furthermore, the patient was informed of those  risks and complications associated with the medications. These include, but are not limited to: allergic reactions (i.e.: anaphylactic or anaphylactoid reaction(s)); adrenal axis suppression; blood sugar elevation that in diabetics may result in ketoacidosis or comma; water retention that in patients with history of congestive heart failure may result in shortness of breath, pulmonary edema, and decompensation with resultant heart failure; weight gain; swelling or edema; medication-induced neural toxicity; particulate matter embolism and blood vessel occlusion with resultant organ, and/or nervous system infarction; and/or aseptic necrosis of one or more joints. Finally, the patient was informed that Medicine is not an exact science; therefore, there is also the possibility of unforeseen or unpredictable risks and/or possible complications that may result in a catastrophic outcome. The patient indicated having understood very clearly. We have given the patient no guarantees and we have made no promises. Enough time was given to the patient to ask questions, all of which were answered to the patient's satisfaction. Ms. Hougland has indicated that she wanted to continue with the procedure. Attestation: I, the ordering provider, attest that I have discussed with the patient the benefits, risks, side-effects, alternatives, likelihood of achieving goals, and potential problems during recovery for the procedure that I have provided informed consent. Date  Time: 03/05/2021 10:36 AM  Pre-Procedure Preparation:  Monitoring: As per clinic protocol. Respiration, ETCO2, SpO2, BP, heart rate and rhythm monitor placed and checked for adequate function Safety Precautions: Patient was assessed for positional comfort and pressure points before starting the procedure. Time-out: I initiated and conducted the "Time-out" before starting the procedure, as per protocol. The patient was asked to participate by confirming the accuracy of  the "Time Out" information. Verification of the correct person, site, and procedure were performed and confirmed by me, the nursing staff, and the patient. "Time-out" conducted as per Joint Commission's Universal Protocol (UP.01.01.01). Time: 1145  Description of Procedure:          Laterality: Bilateral. The procedure was performed in identical fashion on both sides. Levels:  L2, L3, L4, L5, & S1 Medial Branch Level(s) Area Prepped:  Posterior Lumbosacral Region DuraPrep (Iodine Povacrylex [0.7% available iodine] and Isopropyl Alcohol, 74% w/w) Safety Precautions: Aspiration looking for blood return was conducted prior to all injections. At no point did we inject any substances, as a needle was being advanced. Before injecting, the patient was told to immediately notify me if she was experiencing any new onset of "ringing in the ears, or metallic taste in the mouth". No attempts were made at seeking any paresthesias. Safe injection practices and needle disposal techniques used. Medications properly checked for expiration dates. SDV (single dose vial) medications used. After the completion of the procedure, all disposable equipment used was discarded in the proper designated medical waste containers. Local Anesthesia: Protocol guidelines were followed. The patient was positioned over the fluoroscopy table. The area was prepped in the usual manner. The time-out was completed. The target area was identified using fluoroscopy. A 12-in long, straight, sterile hemostat was used with fluoroscopic guidance to locate the targets for each level blocked. Once located, the skin was marked with an approved surgical skin marker. Once all sites were marked, the skin (epidermis, dermis, and hypodermis), as well as deeper tissues (fat, connective tissue and muscle) were infiltrated with a small amount of a short-acting local anesthetic, loaded on a 10cc syringe with a 25G, 1.5-in  Needle. An appropriate amount of time was  allowed for local anesthetics to take effect before proceeding to the next step. Local Anesthetic: Lidocaine 2.0% The unused portion of the local anesthetic was discarded in the proper designated containers. Technical explanation of process:  L2 Medial Branch Nerve Block (MBB): The target area for the L2 medial branch is at the junction of the postero-lateral aspect of the superior articular process and the superior, posterior, and medial edge of the transverse process of L3. Under fluoroscopic guidance, a Quincke needle was inserted until contact was made with os over the superior postero-lateral aspect of the pedicular shadow (target area). After negative aspiration for blood, 0.5 mL of the nerve block solution was injected without difficulty or complication. The needle was removed intact. L3 Medial Branch Nerve Block (MBB): The target area for the L3 medial branch is at the junction of the postero-lateral aspect of the superior articular process and the superior, posterior, and medial edge of the transverse process of L4. Under fluoroscopic guidance, a Quincke needle was inserted until contact was made with os over the superior postero-lateral aspect of the pedicular shadow (target area). After negative aspiration for blood, 0.5 mL of the nerve block solution was injected without difficulty or complication. The needle was removed intact. L4 Medial Branch Nerve Block (MBB): The target area for the L4 medial branch is at the junction of the postero-lateral aspect of the superior articular process and the superior, posterior, and medial edge of the transverse process of L5. Under fluoroscopic guidance, a Quincke needle was inserted until contact was made with os over the superior postero-lateral aspect of the pedicular shadow (target area). After negative aspiration for blood, 0.5 mL of the nerve block solution was injected without difficulty or complication. The needle was removed intact. L5 Medial Branch  Nerve Block (MBB): The target area for the L5 medial branch is at the junction of the postero-lateral aspect of the superior articular process and the superior, posterior, and medial edge of the sacral ala. Under fluoroscopic guidance, a Quincke needle was inserted until contact was made with os over the superior postero-lateral aspect of the pedicular shadow (target area). After negative aspiration for blood, 0.5  mL of the nerve block solution was injected without difficulty or complication. The needle was removed intact. S1 Medial Branch Nerve Block (MBB): The target area for the S1 medial branch is at the posterior and inferior 6 o'clock position of the L5-S1 facet joint. Under fluoroscopic guidance, the Quincke needle inserted for the L5 MBB was redirected until contact was made with os over the inferior and postero aspect of the sacrum, at the 6 o' clock position under the L5-S1 facet joint (Target area). After negative aspiration for blood, 0.5 mL of the nerve block solution was injected without difficulty or complication. The needle was removed intact.  Nerve block solution: 0.2% PF-Ropivacaine + Triamcinolone (40 mg/mL) diluted to a final concentration of 4 mg of Triamcinolone/mL of Ropivacaine The unused portion of the solution was discarded in the proper designated containers. Procedural Needles: 22-gauge, 3.5-inch, Quincke needles used for all levels.  Once the entire procedure was completed, the treated area was cleaned, making sure to leave some of the prepping solution back to take advantage of its long term bactericidal properties.      Illustration of the posterior view of the lumbar spine and the posterior neural structures. Laminae of L2 through S1 are labeled. DPRL5, dorsal primary ramus of L5; DPRS1, dorsal primary ramus of S1; DPR3, dorsal primary ramus of L3; FJ, facet (zygapophyseal) joint L3-L4; I, inferior articular process of L4; LB1, lateral branch of dorsal primary ramus of  L1; IAB, inferior articular branches from L3 medial branch (supplies L4-L5 facet joint); IBP, intermediate branch plexus; MB3, medial branch of dorsal primary ramus of L3; NR3, third lumbar nerve root; S, superior articular process of L5; SAB, superior articular branches from L4 (supplies L4-5 facet joint also); TP3, transverse process of L3.  Vitals:   03/05/21 1158 03/05/21 1205 03/05/21 1215 03/05/21 1225  BP: 126/88 (!) 132/98 (!) 133/93 138/90  Pulse:      Resp: 15 (!) 8 (!) 9 10  Temp:  (!) 97.1 F (36.2 C)  (!) 97.1 F (36.2 C)  TempSrc:      SpO2: 96% 100% 100% 100%  Weight:      Height:         Start Time: 1145 hrs. End Time: 1158 hrs.  Imaging Guidance (Spinal):          Type of Imaging Technique: Fluoroscopy Guidance (Spinal) Indication(s): Assistance in needle guidance and placement for procedures requiring needle placement in or near specific anatomical locations not easily accessible without such assistance. Exposure Time: Please see nurses notes. Contrast: None used. Fluoroscopic Guidance: I was personally present during the use of fluoroscopy. "Tunnel Vision Technique" used to obtain the best possible view of the target area. Parallax error corrected before commencing the procedure. "Direction-depth-direction" technique used to introduce the needle under continuous pulsed fluoroscopy. Once target was reached, antero-posterior, oblique, and lateral fluoroscopic projection used confirm needle placement in all planes. Images permanently stored in EMR. Interpretation: No contrast injected. I personally interpreted the imaging intraoperatively. Adequate needle placement confirmed in multiple planes. Permanent images saved into the patient's record.  Antibiotic Prophylaxis:   Anti-infectives (From admission, onward)    None      Indication(s): None identified  Post-operative Assessment:  Post-procedure Vital Signs:  Pulse/HCG Rate: 68(!) 55 Temp: (!) 97.1 F (36.2  C) Resp: 10 BP: 138/90 SpO2: 100 %  EBL: None  Complications: No immediate post-treatment complications observed by team, or reported by patient.  Note: The patient tolerated the entire procedure well.  A repeat set of vitals were taken after the procedure and the patient was kept under observation following institutional policy, for this type of procedure. Post-procedural neurological assessment was performed, showing return to baseline, prior to discharge. The patient was provided with post-procedure discharge instructions, including a section on how to identify potential problems. Should any problems arise concerning this procedure, the patient was given instructions to immediately contact us, at any time, without hesitation. In any case, we plan to contact the patient by telephone for a follow-up status report regarding this interventional procedure.  Comments:  No additional relevant information.  Plan of Care  Orders:  Orders Placed This Encounter  Procedures   LUMBAR FACET(MEDIAL BRANCH NERVE BLOCK) MBNB    Scheduling Instructions:     Procedure: Lumbar facet block (AKA.: Lumbosacral medial branch nerve block)     Side: Bilateral     Level: L3-4, L4-5, & L5-S1 Facets (L2, L3, L4, L5, & S1 Medial Branch Nerves)     Sedation: Patient's choice.     Timeframe: Today    Order Specific Question:   Where will this procedure be performed?    Answer:   [facility]   DG [facility] C-ARM 1-60 MIN NO REPORT    Intraoperative interpretation by procedural physician at [place].    Standing Status:   Standing    Number of Occurrences:   1    Order Specific Question:   Reason for exam:    Answer:   Assistance in needle guidance and placement for procedures requiring needle placement in or near specific anatomical locations not easily accessible without such assistance.   Informed Consent Details: Physician/Practitioner Attestation; Transcribe to consent form and obtain  patient signature    Nursing Order: Transcribe to consent form and obtain patient signature. Note: Always confirm laterality of pain with Ms. Janoski, before procedure.    Order Specific Question:   Physician/Practitioner attestation of informed consent for procedure/surgical case    Answer:   I, the physician/practitioner, attest that I have discussed with the patient the benefits, risks, side effects, alternatives, likelihood of achieving goals and potential problems during recovery for the procedure that I have provided informed consent.    Order Specific Question:   Procedure    Answer:   Lumbar Facet Block  under fluoroscopic guidance    Order Specific Question:   Physician/Practitioner performing the procedure    Answer:   Aften Lipsey A. Dossie Arbour MD    Order Specific Question:   Indication/Reason    Answer:   Low Back Pain, with our without leg pain, due to Facet Joint Arthralgia (Joint Pain) Spondylosis (Arthritis of the Spine), without myelopathy or radiculopathy (Nerve Damage).   Care order/instruction: Please confirm that the patient has stopped the Xarelto (Rivaroxaban) x 3 days prior to procedure or surgery.    Please confirm that the patient has stopped the Xarelto (Rivaroxaban) x 3 days prior to procedure or surgery.    Standing Status:   Standing    Number of Occurrences:   1   Provide equipment / supplies at bedside    "Block Tray" (Disposable  single use) Needle type: SpinalSpinal Amount/quantity: 4 Size: Medium (5-inch) Gauge: 22G    Standing Status:   Standing    Number of Occurrences:   1    Order Specific Question:   Specify    Answer:   Block Tray   Bleeding precautions    Standing Status:   Standing  Number of Occurrences:   1    Chronic Opioid Analgesic:  No opioid analgesics prescribed by our practice.  The patient received a letter from [facility] indicating that they would not be paying for her oxycodone prescriptions.   Medications ordered for procedure: Meds  ordered this encounter  Medications   lidocaine (XYLOCAINE) 2 % (with pres) injection 400 mg   lactated ringers infusion 1,000 mL   midazolam (VERSED) 5 MG/5ML injection 1-2 mg    Make sure Flumazenil is available in the pyxis when using this medication. If oversedation occurs, administer 0.2 mg IV over 15 sec. If after 45 sec no response, administer 0.2 mg again over 1 min; may repeat at 1 min intervals; not to exceed 4 doses (1 mg)   ropivacaine (PF) 2 mg/mL (0.2%) (NAROPIN) injection 18 mL   triamcinolone acetonide (KENALOG-40) injection 80 mg    Medications administered: We administered lidocaine, lactated ringers, midazolam, ropivacaine (PF) 2 mg/mL (0.2%), and triamcinolone acetonide.  See the medical record for exact dosing, route, and time of administration.  Follow-up plan:   Return in about 2 weeks (around 03/19/2021) for procedure day (afternoon VV) (PPE).      Interventional Therapies  Risk  Complexity Considerations:   Anticoagulation: Xarelto. (Stop: 3 days  Restart: 6 hrs)  Allergy: Iodine and radiological contrast  Implant: Internal cardiac defibrillator  History of noncompliance with post procedure follow-ups. (Prior patient of Dr. Mohammed Kindle) NOT a candidate for Lumbar RFA 2ry to [employer].    Planned  Pending:   Diagnostic intrathecal injection of opioid analgesic as an intrathecal pump trial.    Under consideration:   Diagnostic bilateral L5 TFESI #3  Patient referred to [place] by Dr. Cari Caraway for intrathecal pump trial and possible implant by Dr. Lacinda Axon.  However, the patient has stopped this process indicating that she is not ready for an implant.   Completed:   Therapeutic/palliative bilateral lumbar facet MBB x5 (01/08/2021) (100/100/100/100) Therapeutic/palliative bilateral L5 TFESI x2 (05/31/2019) Therapeutic right lumbar facet RFA x1 (02/07/2020) (DO NOT REPEAT - FUSION hardware)   Therapeutic  Palliative (PRN) options:   Palliative bilateral  lumbar facet block #5  Palliative bilateral L5 TFESI #3      Recent Visits Date Type Provider Dept  02/11/21 Telemedicine Milinda Pointer, [facility]  01/24/21 Telemedicine Milinda Pointer, MD [facility]  01/08/21 Procedure visit Milinda Pointer, MD [facility]  Showing recent visits within past 90 days and meeting all other requirements Today's Visits Date Type Provider Dept  03/05/21 Procedure visit Milinda Pointer, MD [facility]  Showing today's visits and meeting all other requirements Future Appointments Date Type Provider Dept  03/19/21 Appointment Milinda Pointer, MD [facility]  Showing future appointments within next 90 days and meeting all other requirements Disposition: Discharge home  Discharge (Date  Time): 03/05/2021; 1227 hrs.   Primary Care Physician: [facility] Location: [facility] Note by: [name], MD Date: 03/05/2021; Time: 3:56 PM  Disclaimer:  Medicine is not an Chief Strategy Officer. The only guarantee in medicine is that nothing is guaranteed. It is important to note that the decision to proceed with this intervention was based on the information collected from the patient. The Data and conclusions were drawn from the patient's questionnaire, the interview, and the physical examination. Because the information was provided in large part by the patient, it cannot be guaranteed that it has not been purposely or unconsciously manipulated. Every  effort has been made to obtain as much relevant data as possible for this evaluation. It is important to note that the conclusions that lead to this procedure are derived in large part from the available data. Always take into account that the treatment will also be dependent on availability of resources and existing treatment guidelines, considered by other Pain Management Practitioners as being common knowledge  and practice, at the time of the intervention. For Medico-Legal purposes, it is also important to point out that variation in procedural techniques and pharmacological choices are the acceptable norm. The indications, contraindications, technique, and results of the above procedure should only be interpreted and judged by a Board-Certified Interventional Pain Specialist with extensive familiarity and expertise in the same exact procedure and technique.

## 2021-03-05 ENCOUNTER — Ambulatory Visit (HOSPITAL_BASED_OUTPATIENT_CLINIC_OR_DEPARTMENT_OTHER): Payer: Medicaid Other | Admitting: Pain Medicine

## 2021-03-05 ENCOUNTER — Ambulatory Visit
Admission: RE | Admit: 2021-03-05 | Discharge: 2021-03-05 | Disposition: A | Discharge status: Home / Self Care | Payer: Medicaid Other | Source: Ambulatory Visit | Attending: Pain Medicine | Admitting: Pain Medicine

## 2021-03-05 ENCOUNTER — Encounter: Payer: Self-pay | Admitting: Pain Medicine

## 2021-03-05 ENCOUNTER — Other Ambulatory Visit: Payer: Self-pay

## 2021-03-05 VITALS — BP 138/90 | HR 68 | Temp 97.1°F | Resp 10 | Ht 62.0 in | Wt 176.0 lb

## 2021-03-05 DIAGNOSIS — M25551 Pain in right hip: Secondary | ICD-10-CM

## 2021-03-05 DIAGNOSIS — M47816 Spondylosis without myelopathy or radiculopathy, lumbar region: Secondary | ICD-10-CM | POA: Diagnosis not present

## 2021-03-05 DIAGNOSIS — M545 Low back pain, unspecified: Secondary | ICD-10-CM | POA: Diagnosis present

## 2021-03-05 DIAGNOSIS — M47817 Spondylosis without myelopathy or radiculopathy, lumbosacral region: Secondary | ICD-10-CM

## 2021-03-05 DIAGNOSIS — Z91041 Radiographic dye allergy status: Secondary | ICD-10-CM

## 2021-03-05 DIAGNOSIS — M4306 Spondylolysis, lumbar region: Secondary | ICD-10-CM | POA: Insufficient documentation

## 2021-03-05 DIAGNOSIS — G8929 Other chronic pain: Secondary | ICD-10-CM | POA: Insufficient documentation

## 2021-03-05 DIAGNOSIS — M4310 Spondylolisthesis, site unspecified: Secondary | ICD-10-CM | POA: Insufficient documentation

## 2021-03-05 DIAGNOSIS — M532X7 Spinal instabilities, lumbosacral region: Secondary | ICD-10-CM

## 2021-03-05 DIAGNOSIS — M961 Postlaminectomy syndrome, not elsewhere classified: Secondary | ICD-10-CM | POA: Insufficient documentation

## 2021-03-05 DIAGNOSIS — Z95810 Presence of automatic (implantable) cardiac defibrillator: Secondary | ICD-10-CM

## 2021-03-05 DIAGNOSIS — M25552 Pain in left hip: Secondary | ICD-10-CM | POA: Diagnosis present

## 2021-03-05 DIAGNOSIS — Z7901 Long term (current) use of anticoagulants: Secondary | ICD-10-CM

## 2021-03-05 DIAGNOSIS — Z888 Allergy status to other drugs, medicaments and biological substances status: Secondary | ICD-10-CM | POA: Diagnosis present

## 2021-03-05 MED ORDER — ROPIVACAINE HCL 2 MG/ML IJ SOLN
INTRAMUSCULAR | Status: AC
  Filled 2021-03-05: qty 40

## 2021-03-05 MED ORDER — ROPIVACAINE HCL 2 MG/ML IJ SOLN
18.0000 mL | Freq: Once | INTRAMUSCULAR | Status: AC
Start: 2021-03-05 — End: 2021-03-05
  Administered 2021-03-05: 18 mL via PERINEURAL

## 2021-03-05 MED ORDER — LIDOCAINE HCL 2 % IJ SOLN
20.0000 mL | Freq: Once | INTRAMUSCULAR | Status: AC
Start: 2021-03-05 — End: 2021-03-05
  Administered 2021-03-05: 200 mg
  Filled 2021-03-05: qty 100

## 2021-03-05 MED ORDER — MIDAZOLAM HCL 5 MG/5ML IJ SOLN
1.0000 mg | INTRAMUSCULAR | Status: DC | PRN
  Administered 2021-03-05: 3 mg via INTRAVENOUS
  Filled 2021-03-05: qty 5

## 2021-03-05 MED ORDER — TRIAMCINOLONE ACETONIDE 40 MG/ML IJ SUSP
80.0000 mg | Freq: Once | INTRAMUSCULAR | Status: AC
Start: 2021-03-05 — End: 2021-03-05
  Administered 2021-03-05: 80 mg
  Filled 2021-03-05: qty 2

## 2021-03-05 MED ORDER — LACTATED RINGERS IV SOLN
1000.0000 mL | Freq: Once | INTRAVENOUS | Status: AC
Start: 2021-03-05 — End: 2021-03-05
  Administered 2021-03-05: 1000 mL via INTRAVENOUS

## 2021-03-05 NOTE — Patient Instructions (Signed)

## 2021-03-06 ENCOUNTER — Telehealth: Payer: Self-pay

## 2021-03-06 NOTE — Telephone Encounter (Signed)
Called PP, denies any needs at this time. Instructed to call if needed. 

## 2021-03-18 NOTE — Progress Notes (Signed)
Patient: Claire Mcdonald  Service Category: E/M  Provider: [name], MD  DOB: Nov 06, 1965  DOS: 03/19/2021  Location: Office  MRN: [id number]  Setting: Ambulatory outpatient  Referring Provider: [facility]  Type: Established Patient  Specialty: Interventional Pain Management  PCP: [facility]  Location: Remote location  Delivery: TeleHealth     Virtual Encounter - Pain Management PROVIDER NOTE: Information contained herein reflects review and annotations entered in association with encounter. Interpretation of such information and data should be left to medically-trained personnel. Information provided to patient can be located elsewhere in the medical record under "Patient Instructions". Document created using STT-dictation technology, any transcriptional errors that may result from process are unintentional.    Contact & Pharmacy Preferred: [phone] Home: [phone] (home) Mobile: [phone] (mobile) E-mail: [email or web address]  Claire Mcdonald - [place] [address] Phone: [phone] Fax: [phone]  [facility] - [place], [place] - [address] [place] [address] Phone: [phone] Fax: [phone]   Pre-screening  Claire Mcdonald offered "in-person" vs "virtual" encounter. She indicated preferring virtual for this encounter.   Reason COVID-19*  Social distancing based on CDC and AMA recommendations.   I contacted Claire Mcdonald on 03/19/2021 via telephone.      I clearly identified myself as [name], MD. I verified that I was speaking with the correct person using two identifiers (Name: Claire Mcdonald, and date of birth: 12-Jan-1966).  Consent I sought verbal advanced consent from Claire Mcdonald for virtual visit interactions. I informed Claire Mcdonald of possible security and privacy concerns, risks, and limitations associated with providing "not-in-person"  medical evaluation and management services. I also informed Claire Mcdonald of the availability of "in-person" appointments. Finally, I informed her that there would be a charge for the virtual visit and that she could be  personally, fully or partially, financially responsible for it. Claire Mcdonald expressed understanding and agreed to proceed.   Historic Elements   Claire Mcdonald is a 55 y.o. year old, female patient evaluated today after our last contact on 03/05/2021. Claire Mcdonald  has a past medical history of Allergy, Anxiety, Asthma (05/06/2016), Coronary artery disease, DDD (degenerative disc disease), cervical, Depression, Diverticulosis, DVT (deep venous thrombosis) ([place]), GERD (gastroesophageal reflux disease), Heart failure ([place]), High cholesterol, Hyperlipidemia, Hypertension, Idiopathic cardiac arrest ([place]) (02/2015), Migraine, Obesity (BMI 30-39.9), Seizures ([place]), and Ventricular tachycardia ([place]). She also  has a past surgical history that includes Tubal ligation (1998); Ovarian cyst removal (Left); central line (03/25/2015); Hand surgery (Left); Cardiac catheterization (N/A, 03/27/2015); Cardiac catheterization (N/A, 04/11/2015); Cardiac catheterization (N/A, 07/16/2015); Colonoscopy with propofol (N/A, 10/01/2015); Esophagogastroduodenoscopy (egd) with propofol (N/A, 10/01/2015); Abdominal hysterectomy; and Hip Arthroplasty (Right, 02/27/2020). Claire Mcdonald has a current medication list which includes the following prescription(s): acetaminophen, albuterol, atorvastatin, azelastine, gnp calcium 1200, cetirizine, vitamin d3, cholestyramine, diclofenac sodium, dicyclomine, famotidine, famotidine, furosemide, gabapentin, hydroxyzine, magnesium, metoprolol tartrate, polyethylene glycol, potassium chloride sa, promethazine, rivaroxaban, senna-docusate, sertraline, topiramate, triamcinolone, and fluticasone. She  reports that she has never smoked. She has never used smokeless tobacco. She reports that she does not drink alcohol  and does not use drugs. Claire Mcdonald is allergic to tape, iodinated diagnostic agents, toradol [ketorolac tromethamine], tramadol, and zofran [ondansetron hcl].   HPI  Today, she is being contacted for a post-procedure assessment.  Post-Procedure Evaluation  Procedure (03/05/2021):  Type: Lumbar Facet, Medial Branch Block(s)  Primary Purpose: Diagnostic Region: Posterolateral Lumbosacral Spine Level: L2, L3, L4, L5, & S1 Medial Branch Level(s). Injecting these levels blocks the L3-4, L4-5, and L5-S1 lumbar facet joints. Laterality: Bilateral  Pre-procedure pain level: 9/10 Post-procedure: 0/10 (100% relief)  Anxiolysis: Minimal conscious anxiolysis  Effectiveness during initial hour after procedure (Ultra-Short Term Relief): 100 %.  Local anesthetic used: Long-acting (4-6 hours) Effectiveness: Defined as any analgesic benefit obtained secondary to the administration of local anesthetics. This carries significant diagnostic value as to the etiological location, or anatomical origin, of the pain. Duration of benefit is expected to coincide with the duration of the local anesthetic used.  Effectiveness during initial 4-6 hours after procedure (Short-Term Relief): 100 %.  Long-term benefit: Defined as any relief past the pharmacologic duration of the local anesthetics.  Effectiveness past the initial 6 hours after procedure (Long-Term Relief): 100 %.  Benefits, current: Defined as benefit present at the time of this evaluation.   Analgesia:   The patient indicates currently experiencing an ongoing 100% relief of the low back pain. Function: Claire Mcdonald reports improvement in function ROM: Claire Mcdonald reports improvement in ROM  Pharmacotherapy Assessment   Analgesic: No opioid analgesics prescribed by our practice.  The patient received a letter from [facility] indicating that they would not be paying for her oxycodone prescriptions.   Monitoring: [place] PMP: PDMP reviewed during this  encounter.       Pharmacotherapy: No side-effects or adverse reactions reported. Compliance: No problems identified. Effectiveness: Clinically acceptable. Plan: Refer to "POC". UDS: No results found for: SUMMARY   Laboratory Chemistry Profile   Renal Lab Results  Component Value Date   BUN 15 03/01/2020   CREATININE 0.68 03/01/2020   BCR 21 04/16/2015   GFRAA >60 03/01/2020   GFRNONAA >60 03/01/2020    Hepatic Lab Results  Component Value Date   AST 28 02/25/2020   ALT 29 02/25/2020   ALBUMIN 3.9 02/25/2020   ALKPHOS 125 02/25/2020   LIPASE 29 03/27/2019    Electrolytes Lab Results  Component Value Date   NA 140 03/01/2020   K 3.7 03/01/2020   CL 107 03/01/2020   CALCIUM 8.2 (L) 03/01/2020   MG 2.3 02/27/2020   PHOS 3.8 02/19/2017    Bone Lab Results  Component Value Date   VD25OH 19.3 (L) 11/30/2017    Inflammation (CRP: Acute Phase) (ESR: Chronic Phase) Lab Results  Component Value Date   CRP <0.8 11/30/2017   ESRSEDRATE 19 11/30/2017   LATICACIDVEN 0.8 03/26/2015         Note: Above Lab results reviewed.  Imaging  DG PAIN CLINIC C-ARM 1-60 MIN NO REPORT Fluoro was used, but no Radiologist interpretation will be provided.  Please refer to "NOTES" tab for provider progress note.  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 Diagnoses of Failed back surgical syndrome, Lumbar facet syndrome (Bilateral) (R>L), Grade 2-3 (13-16 mm) Anterolisthesis of L5 over S1 (06/22/2019), Spinal instability of lumbosacral region (L5-S1), and Pars defect of lumbar spine were also pertinent to this visit.  Plan of Care  Problem-specific:  No problem-specific Assessment & Plan notes found for this encounter.  Ms. ERIONNA STRUM has a current medication list which includes the following long-term medication(s): albuterol, atorvastatin, azelastine, gnp calcium 1200, cetirizine, cholestyramine, famotidine, famotidine, furosemide,  metoprolol tartrate, rivaroxaban, topiramate, and fluticasone.  Pharmacotherapy (Medications Ordered): No orders of the defined types were placed in this encounter.  Orders:  No orders of  the defined types were placed in this encounter.  Follow-up plan:   No follow-ups on file.     Interventional Therapies  Risk  Complexity Considerations:   Anticoagulation: Xarelto. (Stop: 3 days  Restart: 6 hrs)  Allergy: Iodine and radiological contrast  Implant: Internal cardiac defibrillator  History of noncompliance with post procedure follow-ups. (Prior patient of Dr. Mohammed Kindle) NOT a candidate for Lumbar RFA 2ry to Fusion Hardware.    Planned  Pending:   Diagnostic intrathecal injection of opioid analgesic as an intrathecal pump trial.    Under consideration:   Diagnostic bilateral L5 TFESI #3  Patient referred to [place] by Dr. Cari Caraway for intrathecal pump trial and possible implant by Dr. Lacinda Axon.  However, the patient has stopped this process indicating that she is not ready for an implant.   Completed:   Therapeutic/palliative bilateral lumbar facet MBB x5 (01/08/2021) (100/100/100/100) Therapeutic/palliative bilateral L5 TFESI x2 (05/31/2019) Therapeutic right lumbar facet RFA x1 (02/07/2020) (DO NOT REPEAT - FUSION hardware)   Therapeutic  Palliative (PRN) options:   Palliative bilateral lumbar facet block #5  Palliative bilateral L5 TFESI #3       Recent Visits Date Type Provider Dept  03/05/21 Procedure visit Milinda Pointer, MD [facility]  02/11/21 Telemedicine Milinda Pointer, [facility]  01/24/21 Telemedicine Milinda Pointer, MD [facility]  01/08/21 Procedure visit Milinda Pointer, MD [facility]  Showing recent visits within past 90 days and meeting all other requirements Today's Visits Date Type Provider Dept  03/19/21 Telemedicine Milinda Pointer, MD [facility]  Showing today's visits and  meeting all other requirements Future Appointments No visits were found meeting these conditions. Showing future appointments within next 90 days and meeting all other requirements I discussed the assessment and treatment plan with the patient. The patient was provided an opportunity to ask questions and all were answered. The patient agreed with the plan and demonstrated an understanding of the instructions.  Patient advised to call back or seek an in-person evaluation if the symptoms or condition worsens.  Duration of encounter: 12 minutes.  Note by: [name], MD Date: 03/19/2021; Time: 5:25 PM

## 2021-03-19 ENCOUNTER — Other Ambulatory Visit: Payer: Self-pay

## 2021-03-19 ENCOUNTER — Ambulatory Visit: Payer: Medicaid Other | Attending: Pain Medicine | Admitting: Pain Medicine

## 2021-03-19 DIAGNOSIS — M532X7 Spinal instabilities, lumbosacral region: Secondary | ICD-10-CM

## 2021-03-19 DIAGNOSIS — M961 Postlaminectomy syndrome, not elsewhere classified: Secondary | ICD-10-CM | POA: Diagnosis not present

## 2021-03-19 DIAGNOSIS — M47816 Spondylosis without myelopathy or radiculopathy, lumbar region: Secondary | ICD-10-CM | POA: Diagnosis not present

## 2021-03-19 DIAGNOSIS — G8929 Other chronic pain: Secondary | ICD-10-CM

## 2021-03-19 DIAGNOSIS — M4310 Spondylolisthesis, site unspecified: Secondary | ICD-10-CM | POA: Diagnosis not present

## 2021-03-19 DIAGNOSIS — M545 Low back pain, unspecified: Secondary | ICD-10-CM

## 2021-03-19 DIAGNOSIS — M4306 Spondylolysis, lumbar region: Secondary | ICD-10-CM

## 2021-05-20 NOTE — Progress Notes (Signed)
PROVIDER NOTE: Information contained herein reflects review and annotations entered in association with encounter. Interpretation of such information and data should be left to medically-trained personnel. Information provided to patient can be located elsewhere in the medical record under "Patient Instructions". Document created using STT-dictation technology, any transcriptional errors that may result from process are unintentional.    Patient: Claire Mcdonald  Service Category: E/M  Provider: [name], MD  DOB: [birth year]  DOS: 05/21/2021  Specialty: Interventional Pain Management  MRN: [id number]  Setting: Ambulatory outpatient  PCP: [name], MD  Type: Established Patient    Referring Provider: [facility]  Location: Office  Delivery: Face-to-face     HPI  Claire Mcdonald, a 55 y.o. year old female, is here today because of her Lumbar facet joint syndrome [M47.816]. Claire Mcdonald primary complain today is Back Pain (lower) Last encounter: My last encounter with her was on 03/05/2021. 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[place]); Chronic low back pain (1ry area of Pain) (Bilateral) (R>L) with sciatica (Bilateral); Chronic lower extremity pain (2ry area of Pain) (Bilateral) (R>L); Chronic pain syndrome; Grade 2-3 (13-16 mm) Anterolisthesis of L5 over S1 (06/22/2019); Lumbar foraminal stenosis (L5-S1) (Bilateral); Chronic lumbar radicular pain (S1) (Bilateral); Chronic musculoskeletal pain; Neurogenic pain; Spinal instability of lumbosacral region (L5-S1); Lumbosacral radiculopathy at S1; Abdominal pain; Female pelvic pain; Lower extremity edema; Lymphedema of both lower extremities; Restless leg syndrome; Lumbar facet syndrome (Bilateral) (R>L); Spondylosis  without myelopathy or radiculopathy, lumbosacral region; Other headache syndrome; Pars defect of lumbar spine; Myalgia; Pain in joint, lower leg; Chronic knee pain (Right); Failed back surgical syndrome; Abdominal mass; Fracture of femoral neck, right, closed ([place]); Localized edema; Osteoarthritis of both knees; S/P ORIF (open reduction internal fixation) fracture; Neck pain; Bilateral hip pain; Chronic low back pain (Bilateral) w/o sciatica; Patellofemoral stress syndrome; History of hemiarthroplasty of right hip; and Abnormal CT scan, lumbar spine (02/25/2020) on their pertinent problem list. Pain Assessment: Severity of Chronic pain is reported as a 8 /10. Location: Back Lower/to knees bilat. Onset: More than a month ago. Quality: Aching, Sharp. Timing: Constant. Modifying factor(s): gabapentin. Vitals:  height is 5' 2" (1.575 m) and weight is 174 lb (78.9 kg). Her temporal temperature is 96.8 F (36 C) (abnormal). Her blood pressure is 131/102 (abnormal) and her pulse is 97. Her respiration is 16 and oxygen saturation is 98%.   Reason for encounter: worsening of previously known (established) problem.  The patient comes into clinic today indicating recurrence of her low back pain.  She also indicates that she is having bilateral lower extremity referred pain through the back of the leg down to the knee.  The right side seems to be worse.  She describes that none of the pain goes below the knee.  She also indicates that between her low back pain and leg pain, the low back pain is the worst.  I asked the patient if this was the same type of pain that she had at the last time and she indicated that it is.  I also asked the patient if the last procedure that we did for her helped (bilateral lumbar facet block) and she indicated that it helped immensely.  She would like to have the same procedure repeated again.  She has also requested to have it done with  some sedation.    Pharmacotherapy Assessment   Analgesic: No opioid analgesics prescribed by our practice.  The patient received a letter from [facility] indicating that they would not be paying for her oxycodone prescriptions.   Monitoring: [place] PMP: PDMP reviewed during this encounter.       Pharmacotherapy: No side-effects or adverse reactions reported. Compliance: No problems identified. Effectiveness: Clinically acceptable.  Rise Patience, RN  05/21/2021  1:44 PM  Sign when Signing Visit Safety precautions to be maintained throughout the outpatient stay will include: orient to surroundings, keep bed in low position, maintain call bell within reach at all times, provide assistance with transfer out of bed and ambulation.     UDS:  No results found for: SUMMARY   ROS  Constitutional: Denies any fever or chills Gastrointestinal: No reported hemesis, hematochezia, vomiting, or acute GI distress Musculoskeletal: Denies any acute onset joint swelling, redness, loss of ROM, or weakness Neurological: No reported episodes of acute onset apraxia, aphasia, dysarthria, agnosia, amnesia, paralysis, loss of coordination, or loss of consciousness  Medication Review  GNP Calcium 1200, Magnesium, Vitamin D3, acetaminophen, albuterol, atorvastatin, azelastine, cetirizine, diclofenac Sodium, dicyclomine, famotidine, fluticasone, furosemide, gabapentin, hydrOXYzine, metoprolol tartrate, polyethylene glycol, promethazine, rivaroxaban, senna-docusate, sertraline, topiramate, and triamcinolone  History Review  Allergy: Claire Mcdonald is allergic to tape, iodinated diagnostic agents, toradol [ketorolac tromethamine], tramadol, and zofran [ondansetron hcl]. Drug: Claire Mcdonald  reports no history of drug use. Alcohol:  reports no history of alcohol use. Tobacco:  reports that she has never smoked. She has never used smokeless tobacco. Social: Claire Mcdonald  reports that she has never smoked. She has never used smokeless tobacco. She reports that she does not drink alcohol  and does not use drugs. Medical:  has a past medical history of Allergy, Anxiety, Asthma (05/06/2016), Coronary artery disease, DDD (degenerative disc disease), cervical, Depression, Diverticulosis, DVT (deep venous thrombosis) ([place]), GERD (gastroesophageal reflux disease), Heart failure ([place]), High cholesterol, Hyperlipidemia, Hypertension, Idiopathic cardiac arrest ([place]) (02/2015), Migraine, Obesity (BMI 30-39.9), Seizures ([place]), and Ventricular tachycardia ([place]). Surgical: Claire Mcdonald  has a past surgical history that includes Tubal ligation (1998); Ovarian cyst removal (Left); central line (03/25/2015); Hand surgery (Left); Cardiac catheterization (N/A, 03/27/2015); Cardiac catheterization (N/A, 04/11/2015); Cardiac catheterization (N/A, 07/16/2015); Colonoscopy with propofol (N/A, 10/01/2015); Esophagogastroduodenoscopy (egd) with propofol (N/A, 10/01/2015); Abdominal hysterectomy; and Hip Arthroplasty (Right, 02/27/2020). Family: family history includes Allergies in her daughter, son, and son; Asthma in her father and mother; Breast cancer in her maternal grandmother and paternal aunt; Cancer in her maternal grandmother; Depression in her mother; Diabetes in her father; Early death in her maternal grandmother and paternal grandmother; Heart disease in her son; Hyperlipidemia in her father and mother; Hypertension in her father and mother; Kidney disease in her father.  Laboratory Chemistry Profile   Renal Lab Results  Component Value Date   BUN 15 03/01/2020   CREATININE 0.68 03/01/2020   BCR 21 04/16/2015   GFRAA >60 03/01/2020   GFRNONAA >60 03/01/2020    Hepatic Lab Results  Component Value Date   AST 28 02/25/2020   ALT 29 02/25/2020   ALBUMIN 3.9 02/25/2020   ALKPHOS 125 02/25/2020   LIPASE 29 03/27/2019    Electrolytes Lab Results  Component Value Date   NA 140 03/01/2020   K 3.7 03/01/2020   CL 107 03/01/2020   CALCIUM 8.2 (L) 03/01/2020   MG 2.3 02/27/2020   PHOS 3.8 02/19/2017     Bone Lab Results  Component Value Date  VD25OH 19.3 (L) 11/30/2017    Inflammation (CRP: Acute Phase) (ESR: Chronic Phase) Lab Results  Component Value Date   CRP <0.8 11/30/2017   ESRSEDRATE 19 11/30/2017   LATICACIDVEN 0.8 03/26/2015         Note: Above Lab results reviewed.  Recent Imaging Review  DG PAIN CLINIC C-ARM 1-60 MIN NO REPORT Fluoro was used, but no Radiologist interpretation will be provided.  Please refer to "NOTES" tab for provider progress note. Note: Reviewed        Physical Exam  General appearance: Well nourished, well developed, and well hydrated. In no apparent acute distress Mental status: Alert, oriented x 3 (person, place, & time)       Respiratory: No evidence of acute respiratory distress Eyes: PERLA Vitals: BP (!) 131/102   Pulse 97   Temp (!) 96.8 F (36 C) (Temporal)   Resp 16   Ht 5' 2" (1.575 m)   Wt 174 lb (78.9 kg)   LMP 01/08/2018   SpO2 98%   BMI 31.83 kg/m  BMI: Estimated body mass index is 31.83 kg/m as calculated from the following:   Height as of this encounter: 5' 2" (1.575 m).   Weight as of this encounter: 174 lb (78.9 kg). Ideal: Ideal body weight: 50.1 kg (110 lb 7.2 oz) Adjusted ideal body weight: 61.6 kg (135 lb 13.9 oz)  Assessment   Status Diagnosis  Worsening Stable Stable 1. Lumbar facet syndrome (Bilateral) (R>L)   2. Grade 2-3 (13-16 mm) Anterolisthesis of L5 over S1 (06/22/2019)   3. DDD (degenerative disc disease), lumbar   4. Failed back surgical syndrome   5. Chronic low back pain (Bilateral) w/o sciatica   6. Chronic anticoagulation (Xarelto)      Updated Problems: No problems updated.  Plan of Care  Problem-specific:  No problem-specific Assessment & Plan notes found for this encounter.  Claire Mcdonald has a current medication list which includes the following long-term medication(s): albuterol, atorvastatin, azelastine, gnp calcium 1200, cetirizine, famotidine, famotidine, fluticasone,  furosemide, metoprolol tartrate, rivaroxaban, and topiramate.  Pharmacotherapy (Medications Ordered): No orders of the defined types were placed in this encounter.  Orders:  Orders Placed This Encounter  Procedures   LUMBAR FACET(MEDIAL BRANCH NERVE BLOCK) MBNB    Standing Status:   Future    Standing Expiration Date:   08/20/2021    Scheduling Instructions:     Procedure: Lumbar facet block (AKA.: Lumbosacral medial branch nerve block)     Side: Bilateral     Level: L3-4, L4-5, & L5-S1 Facets (L2, L3, L4, L5, & S1 Medial Branch Nerves)     Sedation: With Sedation.     Timeframe: ASAA    Order Specific Question:   Where will this procedure be performed?    Answer:   [facility]   Blood Thinner Instructions to Nursing    Always make sure patient has clearance from prescribing physician to stop blood thinners for interventional therapies. If the patient requires a Lovenox-bridge therapy, make sure arrangements are made to institute it with the assistance of the PCP.    Scheduling Instructions:     Have Claire Mcdonald stop the Xarelto (Rivaroxaban) x 3 days prior to procedure or surgery.    Follow-up plan:   Return for (Clinic) procedure: (B) L-FCT Blk, (Sed-anx), (Blood Thinner Protocol).     Interventional Therapies  Risk  Complexity Considerations:   Anticoagulation: Xarelto. (Stop: 3 days  Restart: 6 hrs)  Allergy: Iodine and  radiological contrast  Implant: Internal cardiac defibrillator  History of noncompliance with post procedure follow-ups. (Prior patient of Dr. Mohammed Kindle) NOT a candidate for Lumbar RFA 2ry to Fusion Hardware.    Planned  Pending:   Palliative bilateral lumbar facet block    Under consideration:   Diagnostic bilateral L5 TFESI #3  Patient referred to [place] by Dr. Cari Caraway for intrathecal pump trial and possible implant by Dr. Lacinda Axon.  However, the patient has stopped this process indicating that she is not ready for an implant.   Completed:    Therapeutic/palliative bilateral lumbar facet MBB x5 (01/08/2021) (100/100/100/100) Therapeutic/palliative bilateral L5 TFESI x2 (05/31/2019) Therapeutic right lumbar facet RFA x1 (02/07/2020) (DO NOT REPEAT - FUSION hardware)   Therapeutic  Palliative (PRN) options:   Palliative bilateral lumbar facet block #5  Palliative bilateral L5 TFESI #3     Recent Visits Date Type Provider Dept  03/19/21 Telemedicine Milinda Pointer, MD [facility]  03/05/21 Procedure visit Milinda Pointer, MD [facility]  Showing recent visits within past 90 days and meeting all other requirements Today's Visits Date Type Provider Dept  05/21/21 Office Visit Milinda Pointer, MD [facility]  Showing today's visits and meeting all other requirements Future Appointments Date Type Provider Dept  05/28/21 Appointment Milinda Pointer, MD [facility]  Showing future appointments within next 90 days and meeting all other requirements I discussed the assessment and treatment plan with the patient. The patient was provided an opportunity to ask questions and all were answered. The patient agreed with the plan and demonstrated an understanding of the instructions.  Patient advised to call back or seek an in-person evaluation if the symptoms or condition worsens.  Duration of encounter: 30 minutes.  Note by: [name], MD Date: 05/21/2021; Time: 4:19 PM

## 2021-05-21 ENCOUNTER — Encounter: Payer: Self-pay | Admitting: Pain Medicine

## 2021-05-21 ENCOUNTER — Ambulatory Visit: Payer: Medicaid Other | Attending: Pain Medicine | Admitting: Pain Medicine

## 2021-05-21 ENCOUNTER — Other Ambulatory Visit: Payer: Self-pay

## 2021-05-21 VITALS — BP 131/102 | HR 97 | Temp 96.8°F | Resp 16 | Ht 62.0 in | Wt 174.0 lb

## 2021-05-21 DIAGNOSIS — G8929 Other chronic pain: Secondary | ICD-10-CM | POA: Diagnosis present

## 2021-05-21 DIAGNOSIS — M47816 Spondylosis without myelopathy or radiculopathy, lumbar region: Secondary | ICD-10-CM

## 2021-05-21 DIAGNOSIS — M5136 Other intervertebral disc degeneration, lumbar region: Secondary | ICD-10-CM

## 2021-05-21 DIAGNOSIS — M545 Low back pain, unspecified: Secondary | ICD-10-CM

## 2021-05-21 DIAGNOSIS — M4310 Spondylolisthesis, site unspecified: Secondary | ICD-10-CM

## 2021-05-21 DIAGNOSIS — Z7901 Long term (current) use of anticoagulants: Secondary | ICD-10-CM | POA: Diagnosis present

## 2021-05-21 DIAGNOSIS — M961 Postlaminectomy syndrome, not elsewhere classified: Secondary | ICD-10-CM | POA: Diagnosis present

## 2021-05-21 NOTE — Patient Instructions (Addendum)
Facet Blocks Patient Information  Description: The facets are joints in the spine between the vertebrae.  Like any joints in the body, facets can become irritated and painful.  Arthritis can also effect the facets.  By injecting steroids and local anesthetic in and around these joints, we can temporarily block the nerve supply to them.  Steroids act directly on irritated nerves and tissues to reduce selling and inflammation which often leads to decreased pain.  Facet blocks may be done anywhere along the spine from the neck to the low back depending upon the location of your pain.   After numbing the skin with local anesthetic (like Novocaine), a small needle is passed onto the facet joints under x-ray guidance.  You may experience a sensation of pressure while this is being done.  The entire block usually lasts about 15-25 minutes.   Conditions which may be treated by facet blocks:  Low back/buttock pain Neck/shoulder pain Certain types of headaches  Preparation for the injection:  Do not eat any solid food or dairy products within 8 hours of your appointment. You may drink clear liquid up to 3 hours before appointment.  Clear liquids include water, black coffee, juice or soda.  No milk or cream please. You may take your regular medication, including pain medications, with a sip of water before your appointment.  Diabetics should hold regular insulin (if taken separately) and take 1/2 normal NPH dose the morning of the procedure.  Carry some sugar containing items with you to your appointment. A driver must accompany you and be prepared to drive you home after your procedure. Bring all your current medications with you. An IV may be inserted and sedation may be given at the discretion of the physician. A blood pressure cuff, EKG and other monitors will often be applied during the procedure.  Some patients may need to have extra oxygen administered for a short period. You will be asked to  provide medical information, including your allergies and medications, prior to the procedure.  We must know immediately if you are taking blood thinners (like Coumadin/Warfarin) or if you are allergic to IV iodine contrast (dye).  We must know if you could possible be pregnant.  Possible side-effects:  Bleeding from needle site Infection (rare, may require surgery) Nerve injury (rare) Numbness & tingling (temporary) Difficulty urinating (rare, temporary) Spinal headache (a headache worse with upright posture) Light-headedness (temporary) Pain at injection site (serveral days) Decreased blood pressure (rare, temporary) Weakness in arm/leg (temporary) Pressure sensation in back/neck (temporary)   Call if you experience:  Fever/chills associated with headache or increased back/neck pain Headache worsened by an upright position New onset, weakness or numbness of an extremity below the injection site Hives or difficulty breathing (go to the emergency room) Inflammation or drainage at the injection site(s) Severe back/neck pain greater than usual New symptoms which are concerning to you  Please note:  Although the local anesthetic injected can often make your back or neck feel good for several hours after the injection, the pain will likely return. It takes 3-7 days for steroids to work.  You may not notice any pain relief for at least one week.  If effective, we will often do a series of 2-3 injections spaced 3-6 weeks apart to maximally decrease your pain.  After the initial series, you may be a candidate for a more permanent nerve block of the facets.  If you have any questions, please call #[phone]) [facility]  Moderate Conscious Sedation, Adult Sedation is the use of medicines to promote relaxation and to relieve discomfort and anxiety. Moderate conscious sedation is a type of sedation. Under moderate conscious sedation, you are less alert  than normal, but you are still able to respond to instructions, touch, or both. Moderate conscious sedation is used during short medical and dental procedures. It is milder than deep sedation, which is a type of sedation under which you cannot be easily woken up. It is also milder than general anesthesia, which is the use of medicines to make you unconscious. Moderate conscious sedation allows you to return to your regular activities sooner. Tell a health care provider about: Any allergies you have. All medicines you are taking, including vitamins, herbs, eye drops, creams, and over-the-counter medicines. Any use of steroids. This includes steroids taken by mouth or as a cream. Any problems you or family members have had with sedatives and anesthetic medicines. Any blood disorders you have. Any surgeries you have had. Any medical conditions you have, such as sleep apnea. Whether you are pregnant or may be pregnant. Any use of cigarettes, alcohol, marijuana, or drugs. What are the risks? Generally, this is a safe procedure. However, problems may occur, including: Getting too much medicine (oversedation). Nausea. Allergic reaction to medicines. Trouble breathing. If this happens, a breathing tube may be used. It will be removed when you are awake and breathing on your own. Heart trouble. Lung trouble. Confusion that gets better with time (emergence delirium). What happens before the procedure? Staying hydrated Follow instructions from your health care provider about hydration, which may include: Up to 2 hours before the procedure - you may continue to drink clear liquids, such as water, clear fruit juice, black coffee, and plain tea. Eating and drinking restrictions Follow instructions from your health care provider about eating and drinking, which may include: 8 hours before the procedure - stop eating heavy meals or foods, such as meat, fried foods, or fatty foods. 6 hours before the  procedure - stop eating light meals or foods, such as toast or cereal. 6 hours before the procedure - stop drinking milk or drinks that contain milk. 2 hours before the procedure - stop drinking clear liquids. Medicines Ask your health care provider about: Changing or stopping your regular medicines. This is especially important if you are taking diabetes medicines or blood thinners. Taking medicines such as aspirin and ibuprofen. These medicines can thin your blood. Do not take these medicines unless your health care provider tells you to take them. Taking over-the-counter medicines, vitamins, herbs, and supplements. Tests and exams You will have a physical exam. You may have blood tests done to show how well: Your kidneys and liver work. Your blood clots. General instructions Plan to have a responsible adult take you home from the hospital or clinic. If you will be going home right after the procedure, plan to have a responsible adult care for you for the time you are told. This is important. What happens during the procedure?  You will be given the sedative. The sedative may be given: As a pill that you will swallow. It can also be inserted into the rectum. As a spray through the nose. As an injection into the muscle. As an injection into the vein through an IV. You may be given oxygen as needed. Your breathing, heart rate, and blood pressure will be monitored during the procedure. The medical or dental procedure will be done. The procedure  may vary among health care providers and hospitals. What happens after the procedure? Your blood pressure, heart rate, breathing rate, and blood oxygen level will be monitored until you leave the hospital or clinic. You will get fluids through your IV if needed. Do not drive or operate machinery until your health care provider says that it is safe. Summary Sedation is the use of medicines to promote relaxation and to relieve discomfort and  anxiety. Moderate conscious sedation is a type of sedation that is used during short medical and dental procedures. Tell the health care provider about any medical conditions that you have and about all the medicines that you are taking. You will be given the sedative as a pill, a spray through the nose, an injection into the muscle, or an injection into the vein through an IV. Vital signs are monitored during the sedation. Moderate conscious sedation allows you to return to your regular activities sooner. This information is not intended to replace advice given to you by your health care provider. Make sure you discuss any questions you have with your health care provider. Document Revised: 12/09/2019 Document Reviewed: 07/07/2019 Elsevier Patient Education  2022 [place]  What are the risk, side effects and possible complications? Generally speaking, most procedures are safe.  However, with any procedure there are risks, side effects, and the possibility of complications.  The risks and complications are dependent upon the sites that are lesioned, or the type of nerve block to be performed.  The closer the procedure is to the spine, the more serious the risks are.  Great care is taken when placing the radio frequency needles, block needles or lesioning probes, but sometimes complications can occur. Infection: Any time there is an injection through the skin, there is a risk of infection.  This is why sterile conditions are used for these blocks.  There are four possible types of infection. Localized skin infection. Central Nervous System Infection-This can be in the form of Meningitis, which can be deadly. Epidural Infections-This can be in the form of an epidural abscess, which can cause pressure inside of the spine, causing compression of the spinal cord with subsequent paralysis. This would require an emergency surgery to decompress, and there are no  guarantees that the patient would recover from the paralysis. Discitis-This is an infection of the intervertebral discs.  It occurs in about 1% of discography procedures.  It is difficult to treat and it may lead to surgery.        2. Pain: the needles have to go through skin and soft tissues, will cause soreness.       3. Damage to internal structures:  The nerves to be lesioned may be near blood vessels or    other nerves which can be potentially damaged.       4. Bleeding: Bleeding is more common if the patient is taking blood thinners such as  aspirin, Coumadin, Ticiid, Plavix, etc., or if he/she have some genetic predisposition  such as hemophilia. Bleeding into the spinal canal can cause compression of the spinal  cord with subsequent paralysis.  This would require an emergency surgery to  decompress and there are no guarantees that the patient would recover from the  paralysis.       5. Pneumothorax:  Puncturing of a lung is a possibility, every time a needle is introduced in  the area of the chest or upper back.  Pneumothorax refers to free air  around the  collapsed lung(s), inside of the thoracic cavity (chest cavity).  Another two possible  complications related to a similar event would include: Hemothorax and Chylothorax.   These are variations of the Pneumothorax, where instead of air around the collapsed  lung(s), you may have blood or chyle, respectively.       6. Spinal headaches: They may occur with any procedures in the area of the spine.       7. Persistent CSF (Cerebro-Spinal Fluid) leakage: This is a rare problem, but may occur  with prolonged intrathecal or epidural catheters either due to the formation of a fistulous  track or a dural tear.       8. Nerve damage: By working so close to the spinal cord, there is always a possibility of  nerve damage, which could be as serious as a permanent spinal cord injury with  paralysis.       9. Death:  Although rare, severe deadly allergic  reactions known as "Anaphylactic  reaction" can occur to any of the medications used.      10. Worsening of the symptoms:  We can always make thing worse.  What are the chances of something like this happening? Chances of any of this occuring are extremely low.  By statistics, you have more of a chance of getting killed in a motor vehicle accident: while driving to the hospital than any of the above occurring .  Nevertheless, you should be aware that they are possibilities.  In general, it is similar to taking a shower.  Everybody knows that you can slip, hit your head and get killed.  Does that mean that you should not shower again?  Nevertheless always keep in mind that statistics do not mean anything if you happen to be on the wrong side of them.  Even if a procedure has a 1 (one) in a 1,000,000 (million) chance of going wrong, it you happen to be that one..Also, keep in mind that by statistics, you have more of a chance of having something go wrong when taking medications.  Who should not have this procedure? If you are on a blood thinning medication (e.g. Coumadin, Plavix, see list of "Blood Thinners"), or if you have an active infection going on, you should not have the procedure.  If you are taking any blood thinners, please inform your physician.  How should I prepare for this procedure? Do not eat or drink anything at least six hours prior to the procedure. Bring a driver with you .  It cannot be a taxi. Come accompanied by an adult that can drive you back, and that is strong enough to help you if your legs get weak or numb from the local anesthetic. Take all of your medicines the morning of the procedure with just enough water to swallow them. If you have diabetes, make sure that you are scheduled to have your procedure done first thing in the morning, whenever possible. If you have diabetes, take only half of your insulin dose and notify our nurse that you have done so as soon as you arrive  at the clinic. If you are diabetic, but only take blood sugar pills (oral hypoglycemic), then do not take them on the morning of your procedure.  You may take them after you have had the procedure. Do not take aspirin or any aspirin-containing medications, at least eleven (11) days prior to the procedure.  They may prolong bleeding. Wear loose fitting clothing that  may be easy to take off and that you would not mind if it got stained with Betadine or blood. Do not wear any jewelry or perfume Remove any nail coloring.  It will interfere with some of our monitoring equipment.  NOTE: Remember that this is not meant to be interpreted as a complete list of all possible complications.  Unforeseen problems may occur.  BLOOD THINNERS The following drugs contain aspirin or other products, which can cause increased bleeding during surgery and should not be taken for 2 weeks prior to and 1 week after surgery.  If you should need take something for relief of minor pain, you may take acetaminophen which is found in Tylenol,m Datril, Anacin-3 and Panadol. It is not blood thinner. The products listed below are.  Do not take any of the products listed below in addition to any listed on your instruction sheet.  A.P.C or A.P.C with Codeine Codeine Phosphate Capsules #3 Ibuprofen Ridaura  ABC compound Congesprin Imuran rimadil  Advil Cope Indocin Robaxisal  Alka-Seltzer Effervescent Pain Reliever and Antacid Coricidin or Coricidin-D  Indomethacin Rufen  Alka-Seltzer plus Cold Medicine Cosprin Ketoprofen S-A-C Tablets  Anacin Analgesic Tablets or Capsules Coumadin Korlgesic Salflex  Anacin Extra Strength Analgesic tablets or capsules CP-2 Tablets Lanoril Salicylate  Anaprox Cuprimine Capsules Levenox Salocol  Anexsia-D Dalteparin Magan Salsalate  Anodynos Darvon compound Magnesium Salicylate Sine-off  Ansaid Dasin Capsules Magsal Sodium Salicylate  Anturane Depen Capsules Marnal Soma  APF Arthritis pain formula  Dewitt's Pills Measurin Stanback  Argesic Dia-Gesic Meclofenamic Sulfinpyrazone  Arthritis Bayer Timed Release Aspirin Diclofenac Meclomen Sulindac  Arthritis pain formula Anacin Dicumarol Medipren Supac  Analgesic (Safety coated) Arthralgen Diffunasal Mefanamic Suprofen  Arthritis Strength Bufferin Dihydrocodeine Mepro Compound Suprol  Arthropan liquid Dopirydamole Methcarbomol with Aspirin Synalgos  ASA tablets/Enseals Disalcid Micrainin Tagament  Ascriptin Doan's Midol Talwin  Ascriptin A/D Dolene Mobidin Tanderil  Ascriptin Extra Strength Dolobid Moblgesic Ticlid  Ascriptin with Codeine Doloprin or Doloprin with Codeine Momentum Tolectin  Asperbuf Duoprin Mono-gesic Trendar  Aspergum Duradyne Motrin or Motrin IB Triminicin  Aspirin plain, buffered or enteric coated Durasal Myochrisine Trigesic  Aspirin Suppositories Easprin Nalfon Trillsate  Aspirin with Codeine Ecotrin Regular or Extra Strength Naprosyn Uracel  Atromid-S Efficin Naproxen Ursinus  Auranofin Capsules Elmiron Neocylate Vanquish  Axotal Emagrin Norgesic Verin  Azathioprine Empirin or Empirin with Codeine Normiflo Vitamin E  Azolid Emprazil Nuprin Voltaren  Bayer Aspirin plain, buffered or children's or timed BC Tablets or powders Encaprin Orgaran Warfarin Sodium  Buff-a-Comp Enoxaparin Orudis Zorpin  Buff-a-Comp with Codeine Equegesic Os-Cal-Gesic   Buffaprin Excedrin plain, buffered or Extra Strength Oxalid   Bufferin Arthritis Strength Feldene Oxphenbutazone   Bufferin plain or Extra Strength Feldene Capsules Oxycodone with Aspirin   Bufferin with Codeine Fenoprofen Fenoprofen Pabalate or Pabalate-SF   Buffets II Flogesic Panagesic   Buffinol plain or Extra Strength Florinal or Florinal with Codeine Panwarfarin   Buf-Tabs Flurbiprofen Penicillamine   Butalbital Compound Four-way cold tablets Penicillin   Butazolidin Fragmin Pepto-Bismol   Carbenicillin Geminisyn Percodan   Carna Arthritis Reliever Geopen  Persantine   Carprofen Gold's salt Persistin   Chloramphenicol Goody's Phenylbutazone   Chloromycetin Haltrain Piroxlcam   Clmetidine heparin Plaquenil   Cllnoril Hyco-pap Ponstel   Clofibrate Hydroxy chloroquine Propoxyphen         Before stopping any of these medications, be sure to consult the physician who ordered them.  Some, such as Coumadin (Warfarin) are ordered to prevent or treat serious conditions such as "deep  thrombosis", "pumonary embolisms", and other heart problems.  The amount of time that you may need off of the medication may also vary with the medication and the reason for which you were taking it.  If you are taking any of these medications, please make sure you notify your pain physician before you undergo any procedures.

## 2021-05-21 NOTE — Progress Notes (Signed)
Safety precautions to be maintained throughout the outpatient stay will include: orient to surroundings, keep bed in low position, maintain call bell within reach at all times, provide assistance with transfer out of bed and ambulation.  

## 2021-05-27 NOTE — Progress Notes (Signed)
PROVIDER NOTE: Information contained herein reflects review and annotations entered in association with encounter. Interpretation of such information and data should be left to medically-trained personnel. Information provided to patient can be located elsewhere in the medical record under "Patient Instructions". Document created using STT-dictation technology, any transcriptional errors that may result from process are unintentional.    Patient: Claire Mcdonald  Service Category: Procedure  Provider: [name], MD  DOB: 1966-01-21  DOS: 05/28/2021  Location: [facility]  MRN: [id number]  Setting: Ambulatory - outpatient  Referring Provider: [name], MD  Type: Established Patient  Specialty: Interventional Pain Management  PCP: Claire Henle, MD   Primary Reason for Visit: Interventional Pain Management Treatment. CC: Back Pain (Low and bilateral) and Hip Pain (bilateral)    Procedure:          Anesthesia, Analgesia, Anxiolysis:  Type: Lumbar Facet, Medial Branch Block(s)          Primary Purpose: Diagnostic Region: Posterolateral Lumbosacral Spine Level: L2, L3, L4, L5, & S1 Medial Branch Level(s). Injecting these levels blocks the L3-4, L4-5, and L5-S1 lumbar facet joints. Laterality: Bilateral  Type: Local Anesthesia Local Anesthetic: Lidocaine 1-2% Sedation: Minimal Anxiolysis  Indication(s): Anxiety & Analgesia Route: Infiltration ([place]/IM) IV Access: Available   Position: Prone   Indications: 1. Lumbar facet syndrome (Bilateral) (R>L)   2. Spondylosis without myelopathy or radiculopathy, lumbosacral region   3. Chronic low back pain (Bilateral) w/o sciatica   4. DDD (degenerative disc disease), lumbar   5. Grade 2-3 (13-16 mm) Anterolisthesis of L5 over S1 (06/22/2019)   6. Lumbar facet arthropathy   7. Chronic anticoagulation (Xarelto)   8. History of Allergy to iodine   9. History of allergy to radiographic contrast media    Pain  Score: Pre-procedure: 8 /10 Post-procedure: 0-No pain/10     Pre-op H&P Assessment:  Claire Mcdonald is a 55 y.o. (year old), female patient, seen today for interventional treatment. She  has a past surgical history that includes Tubal ligation (1998); Ovarian cyst removal (Left); central line (03/25/2015); Hand surgery (Left); Cardiac catheterization (N/A, 03/27/2015); Cardiac catheterization (N/A, 04/11/2015); Cardiac catheterization (N/A, 07/16/2015); Colonoscopy with propofol (N/A, 10/01/2015); Esophagogastroduodenoscopy (egd) with propofol (N/A, 10/01/2015); Abdominal hysterectomy; and Hip Arthroplasty (Right, 02/27/2020). Claire Mcdonald has a current medication list which includes the following prescription(s): acetaminophen, albuterol, atorvastatin, azelastine, gnp calcium 1200, cetirizine, vitamin d3, diclofenac sodium, dicyclomine, furosemide, gabapentin, magnesium, metoprolol tartrate, polyethylene glycol, promethazine, rivaroxaban, senna-docusate, sertraline, topiramate, triamcinolone, famotidine, famotidine, fluticasone, and hydroxyzine. Her primarily concern today is the Back Pain (Low and bilateral) and Hip Pain (bilateral)  Initial Vital Signs:  Pulse/HCG Rate: (!) 51ECG Heart Rate: (!) 48 Temp: (!) 97 F (36.1 C) Resp: 17 BP: 124/84 SpO2: 99 %  BMI: Estimated body mass index is 31.83 kg/m as calculated from the following:   Height as of this encounter: 5\' 2"  (1.575 m).   Weight as of this encounter: 174 lb (78.9 kg).  Risk Assessment: Allergies: Reviewed. She is allergic to tape, iodinated diagnostic agents, toradol [ketorolac tromethamine], tramadol, and zofran [ondansetron hcl].  Allergy Precautions: None required Coagulopathies: Reviewed. None identified.  Blood-thinner therapy: None at this time Active Infection(s): Reviewed. None identified. Claire Mcdonald is afebrile  Site Confirmation: Claire Mcdonald was asked to confirm the procedure and laterality before marking the site Procedure checklist:  Completed Consent: Before the procedure and under the influence of no sedative(s), amnesic(s), or anxiolytics, the patient was informed of the treatment options,  risks and possible complications. To fulfill our ethical and legal obligations, as recommended by the American Medical Association's Code of Ethics, I have informed the patient of my clinical impression; the nature and purpose of the treatment or procedure; the risks, benefits, and possible complications of the intervention; the alternatives, including doing nothing; the risk(s) and benefit(s) of the alternative treatment(s) or procedure(s); and the risk(s) and benefit(s) of doing nothing. The patient was provided information about the general risks and possible complications associated with the procedure. These may include, but are not limited to: failure to achieve desired goals, infection, bleeding, organ or nerve damage, allergic reactions, paralysis, and death. In addition, the patient was informed of those risks and complications associated to Spine-related procedures, such as failure to decrease pain; infection (i.e.: Meningitis, epidural or intraspinal abscess); bleeding (i.e.: epidural hematoma, subarachnoid hemorrhage, or any other type of intraspinal or peri-dural bleeding); organ or nerve damage (i.e.: Any type of peripheral nerve, nerve root, or spinal cord injury) with subsequent damage to sensory, motor, and/or autonomic systems, resulting in permanent pain, numbness, and/or weakness of one or several areas of the body; allergic reactions; (i.e.: anaphylactic reaction); and/or death. Furthermore, the patient was informed of those risks and complications associated with the medications. These include, but are not limited to: allergic reactions (i.e.: anaphylactic or anaphylactoid reaction(s)); adrenal axis suppression; blood sugar elevation that in diabetics may result in ketoacidosis or comma; water retention that in patients with history  of congestive heart failure may result in shortness of breath, pulmonary edema, and decompensation with resultant heart failure; weight gain; swelling or edema; medication-induced neural toxicity; particulate matter embolism and blood vessel occlusion with resultant organ, and/or nervous system infarction; and/or aseptic necrosis of one or more joints. Finally, the patient was informed that Medicine is not an exact science; therefore, there is also the possibility of unforeseen or unpredictable risks and/or possible complications that may result in a catastrophic outcome. The patient indicated having understood very clearly. We have given the patient no guarantees and we have made no promises. Enough time was given to the patient to ask questions, all of which were answered to the patient's satisfaction. Claire Mcdonald has indicated that she wanted to continue with the procedure. Attestation: I, the ordering provider, attest that I have discussed with the patient the benefits, risks, side-effects, alternatives, likelihood of achieving goals, and potential problems during recovery for the procedure that I have provided informed consent. Date  Time: 05/28/2021  8:03 AM  Pre-Procedure Preparation:  Monitoring: As per clinic protocol. Respiration, ETCO2, SpO2, BP, heart rate and rhythm monitor placed and checked for adequate function Safety Precautions: Patient was assessed for positional comfort and pressure points before starting the procedure. Time-out: I initiated and conducted the "Time-out" before starting the procedure, as per protocol. The patient was asked to participate by confirming the accuracy of the "Time Out" information. Verification of the correct person, site, and procedure were performed and confirmed by me, the nursing staff, and the patient. "Time-out" conducted as per Joint Commission's Universal Protocol (UP.01.01.01). Time: 1610  Description of Procedure:          Laterality: Bilateral. The  procedure was performed in identical fashion on both sides. Levels:  L2, L3, L4, L5, & S1 Medial Branch Level(s) Area Prepped: Posterior Lumbosacral Region DuraPrep (Iodine Povacrylex [0.7% available iodine] and Isopropyl Alcohol, 74% w/w) Safety Precautions: Aspiration looking for blood return was conducted prior to all injections. At no point did we inject any substances,  as a needle was being advanced. Before injecting, the patient was told to immediately notify me if she was experiencing any new onset of "ringing in the ears, or metallic taste in the mouth". No attempts were made at seeking any paresthesias. Safe injection practices and needle disposal techniques used. Medications properly checked for expiration dates. SDV (single dose vial) medications used. After the completion of the procedure, all disposable equipment used was discarded in the proper designated medical waste containers. Local Anesthesia: Protocol guidelines were followed. The patient was positioned over the fluoroscopy table. The area was prepped in the usual manner. The time-out was completed. The target area was identified using fluoroscopy. A 12-in long, straight, sterile hemostat was used with fluoroscopic guidance to locate the targets for each level blocked. Once located, the skin was marked with an approved surgical skin marker. Once all sites were marked, the skin (epidermis, dermis, and hypodermis), as well as deeper tissues (fat, connective tissue and muscle) were infiltrated with a small amount of a short-acting local anesthetic, loaded on a 10cc syringe with a 25G, 1.5-in  Needle. An appropriate amount of time was allowed for local anesthetics to take effect before proceeding to the next step. Local Anesthetic: Lidocaine 2.0% The unused portion of the local anesthetic was discarded in the proper designated containers. Technical explanation of process:  L2 Medial Branch Nerve Block (MBB): The target area for the L2 medial  branch is at the junction of the postero-lateral aspect of the superior articular process and the superior, posterior, and medial edge of the transverse process of L3. Under fluoroscopic guidance, a Quincke needle was inserted until contact was made with os over the superior postero-lateral aspect of the pedicular shadow (target area). After negative aspiration for blood, 0.5 mL of the nerve block solution was injected without difficulty or complication. The needle was removed intact. L3 Medial Branch Nerve Block (MBB): The target area for the L3 medial branch is at the junction of the postero-lateral aspect of the superior articular process and the superior, posterior, and medial edge of the transverse process of L4. Under fluoroscopic guidance, a Quincke needle was inserted until contact was made with os over the superior postero-lateral aspect of the pedicular shadow (target area). After negative aspiration for blood, 0.5 mL of the nerve block solution was injected without difficulty or complication. The needle was removed intact. L4 Medial Branch Nerve Block (MBB): The target area for the L4 medial branch is at the junction of the postero-lateral aspect of the superior articular process and the superior, posterior, and medial edge of the transverse process of L5. Under fluoroscopic guidance, a Quincke needle was inserted until contact was made with os over the superior postero-lateral aspect of the pedicular shadow (target area). After negative aspiration for blood, 0.5 mL of the nerve block solution was injected without difficulty or complication. The needle was removed intact. L5 Medial Branch Nerve Block (MBB): The target area for the L5 medial branch is at the junction of the postero-lateral aspect of the superior articular process and the superior, posterior, and medial edge of the sacral ala. Under fluoroscopic guidance, a Quincke needle was inserted until contact was made with os over the superior  postero-lateral aspect of the pedicular shadow (target area). After negative aspiration for blood, 0.5 mL of the nerve block solution was injected without difficulty or complication. The needle was removed intact. S1 Medial Branch Nerve Block (MBB): The target area for the S1 medial branch is at the posterior  and inferior 6 o'clock position of the L5-S1 facet joint. Under fluoroscopic guidance, the Quincke needle inserted for the L5 MBB was redirected until contact was made with os over the inferior and postero aspect of the sacrum, at the 6 o' clock position under the L5-S1 facet joint (Target area). After negative aspiration for blood, 0.5 mL of the nerve block solution was injected without difficulty or complication. The needle was removed intact.  Nerve block solution: 0.2% PF-Ropivacaine + Triamcinolone (40 mg/mL) diluted to a final concentration of 4 mg of Triamcinolone/mL of Ropivacaine The unused portion of the solution was discarded in the proper designated containers. Procedural Needles: 22-gauge, 3.5-inch, Quincke needles used for all levels.  Once the entire procedure was completed, the treated area was cleaned, making sure to leave some of the prepping solution back to take advantage of its long term bactericidal properties.      Illustration of the posterior view of the lumbar spine and the posterior neural structures. Laminae of L2 through S1 are labeled. DPRL5, dorsal primary ramus of L5; DPRS1, dorsal primary ramus of S1; DPR3, dorsal primary ramus of L3; FJ, facet (zygapophyseal) joint L3-L4; I, inferior articular process of L4; LB1, lateral branch of dorsal primary ramus of L1; IAB, inferior articular branches from L3 medial branch (supplies L4-L5 facet joint); IBP, intermediate branch plexus; MB3, medial branch of dorsal primary ramus of L3; NR3, third lumbar nerve root; S, superior articular process of L5; SAB, superior articular branches from L4 (supplies L4-5 facet joint also);  TP3, transverse process of L3.  Vitals:   05/28/21 0852 05/28/21 0900 05/28/21 0910 05/28/21 0921  BP: 127/76 (!) 106/94 (!) 141/90 140/89  Pulse: (!) 51     Resp: 15 15 15 15   Temp:  (!) 97.3 F (36.3 C)  (!) 97.3 F (36.3 C)  TempSrc:      SpO2: 100% 100% 100% 100%  Weight:      Height:         Start Time: 0835 hrs. End Time: 0851 hrs.  Imaging Guidance (Spinal):          Type of Imaging Technique: Fluoroscopy Guidance (Spinal) Indication(s): Assistance in needle guidance and placement for procedures requiring needle placement in or near specific anatomical locations not easily accessible without such assistance. Exposure Time: Please see nurses notes. Contrast: None used. Fluoroscopic Guidance: I was personally present during the use of fluoroscopy. "Tunnel Vision Technique" used to obtain the best possible view of the target area. Parallax error corrected before commencing the procedure. "Direction-depth-direction" technique used to introduce the needle under continuous pulsed fluoroscopy. Once target was reached, antero-posterior, oblique, and lateral fluoroscopic projection used confirm needle placement in all planes. Images permanently stored in EMR. Interpretation: No contrast injected. I personally interpreted the imaging intraoperatively. Adequate needle placement confirmed in multiple planes. Permanent images saved into the patient's record.  Antibiotic Prophylaxis:   Anti-infectives (From admission, onward)    None      Indication(s): None identified  Post-operative Assessment:  Post-procedure Vital Signs:  Pulse/HCG Rate: (!) 51 (sb)(!) 48 Temp: (!) 97.3 F (36.3 C) Resp: 15 BP: 140/89 SpO2: 100 %  EBL: None  Complications: No immediate post-treatment complications observed by team, or reported by patient.  Note: The patient tolerated the entire procedure well. A repeat set of vitals were taken after the procedure and the patient was kept under  observation following institutional policy, for this type of procedure. Post-procedural neurological assessment was performed, showing return to baseline,  prior to discharge. The patient was provided with post-procedure discharge instructions, including a section on how to identify potential problems. Should any problems arise concerning this procedure, the patient was given instructions to immediately contact us, at any time, without hesitation. In any case, we plan to contact the patient by telephone for a follow-up status report regarding this interventional procedure.  Comments:  No additional relevant information.  Plan of Care  Orders:  Orders Placed This Encounter  Procedures   LUMBAR FACET(MEDIAL BRANCH NERVE BLOCK) MBNB    Scheduling Instructions:     Procedure: Lumbar facet block (AKA.: Lumbosacral medial branch nerve block)     Side: Bilateral     Level: L3-4, L4-5, & L5-S1 Facets (L2, L3, L4, L5, & S1 Medial Branch Nerves)     Sedation: Patient's choice.     Timeframe: Today    Order Specific Question:   Where will this procedure be performed?    Answer:   [facility]   DG PAIN [facility] C-ARM 1-60 MIN NO REPORT    Intraoperative interpretation by procedural physician at [place].    Standing Status:   Standing    Number of Occurrences:   1    Order Specific Question:   Reason for exam:    Answer:   Assistance in needle guidance and placement for procedures requiring needle placement in or near specific anatomical locations not easily accessible without such assistance.   Informed Consent Details: Physician/Practitioner Attestation; Transcribe to consent form and obtain patient signature    Nursing Order: Transcribe to consent form and obtain patient signature. Note: Always confirm laterality of pain with Claire Mcdonald, before procedure.    Order Specific Question:   Physician/Practitioner attestation of informed consent for procedure/surgical case    Answer:    I, the physician/practitioner, attest that I have discussed with the patient the benefits, risks, side effects, alternatives, likelihood of achieving goals and potential problems during recovery for the procedure that I have provided informed consent.    Order Specific Question:   Procedure    Answer:   Lumbar Facet Block  under fluoroscopic guidance    Order Specific Question:   Physician/Practitioner performing the procedure    Answer:   Mckenna Gamm A. Dossie Arbour MD    Order Specific Question:   Indication/Reason    Answer:   Low Back Pain, with our without leg pain, due to Facet Joint Arthralgia (Joint Pain) Spondylosis (Arthritis of the Spine), without myelopathy or radiculopathy (Nerve Damage).   Care order/instruction: Please confirm that the patient has stopped the Xarelto (Rivaroxaban) x 3 days prior to procedure or surgery.    Please confirm that the patient has stopped the Xarelto (Rivaroxaban) x 3 days prior to procedure or surgery.    Standing Status:   Standing    Number of Occurrences:   1   Provide equipment / supplies at bedside    "Block Tray" (Disposable  single use) Needle type: SpinalSpinal Amount/quantity: 4 Size: Medium (5-inch) Gauge: 22G    Standing Status:   Standing    Number of Occurrences:   1    Order Specific Question:   Specify    Answer:   Block Tray   Bleeding precautions    Standing Status:   Standing    Number of Occurrences:   1    Chronic Opioid Analgesic:  No opioid analgesics prescribed by our practice.  The patient received a letter from [facility] indicating that they would not be  paying for her oxycodone prescriptions.   Medications ordered for procedure: Meds ordered this encounter  Medications   lidocaine (XYLOCAINE) 2 % (with pres) injection 400 mg   pentafluoroprop-tetrafluoroeth (GEBAUERS) aerosol   lactated ringers infusion 1,000 mL   midazolam (VERSED) 5 MG/5ML injection 0.5-2 mg    Make sure Flumazenil is available in the pyxis when using  this medication. If oversedation occurs, administer 0.2 mg IV over 15 sec. If after 45 sec no response, administer 0.2 mg again over 1 min; may repeat at 1 min intervals; not to exceed 4 doses (1 mg)   ropivacaine (PF) 2 mg/mL (0.2%) (NAROPIN) injection 18 mL   triamcinolone acetonide (KENALOG-40) injection 80 mg    Medications administered: We administered lidocaine, pentafluoroprop-tetrafluoroeth, lactated ringers, midazolam, ropivacaine (PF) 2 mg/mL (0.2%), and triamcinolone acetonide.  See the medical record for exact dosing, route, and time of administration.  Follow-up plan:   Return in about 2 weeks (around 06/11/2021) for Proc-day (T,Th), (VV), (PPE).       Interventional Therapies  Risk  Complexity Considerations:   Anticoagulation: Xarelto. (Stop: 3 days  Restart: 6 hrs)  Allergy: Iodine and radiological contrast  Implant: Internal cardiac defibrillator  History of noncompliance with post procedure follow-ups. (Prior patient of Dr. Mohammed Kindle) NOT a candidate for Lumbar RFA 2ry to [employer]ware.    Planned  Pending:   Palliative bilateral lumbar facet block    Under consideration:   Diagnostic bilateral L5 TFESI #3  Patient referred to [place] by Dr. Cari Caraway for intrathecal pump trial and possible implant by Dr. Lacinda Axon.  However, the patient has stopped this process indicating that she is not ready for an implant.   Completed:   Therapeutic/palliative bilateral lumbar facet MBB x5 (01/08/2021) (100/100/100/100) Therapeutic/palliative bilateral L5 TFESI x2 (05/31/2019) Therapeutic right lumbar facet RFA x1 (02/07/2020) (DO NOT REPEAT - FUSION hardware)   Therapeutic  Palliative (PRN) options:   Palliative bilateral lumbar facet block #5  Palliative bilateral L5 TFESI #3      Recent Visits Date Type Provider Dept  05/21/21 Office Visit Milinda Pointer, [facility]  03/19/21 Telemedicine Milinda Pointer, MD [facility]  03/05/21  Procedure visit Milinda Pointer, MD [facility]  Showing recent visits within past 90 days and meeting all other requirements Today's Visits Date Type Provider Dept  05/28/21 Procedure visit Milinda Pointer, MD [facility]  Showing today's visits and meeting all other requirements Future Appointments Date Type Provider Dept  06/11/21 Appointment Milinda Pointer, MD [facility]  Showing future appointments within next 90 days and meeting all other requirements Disposition: Discharge home  Discharge (Date  Time): 05/28/2021; 0922 hrs.   Primary Care Physician: Claire Henle, MD Location: [facility] Note by: [name], MD Date: 05/28/2021; Time: 1:28 PM  Disclaimer:  Medicine is not an Chief Strategy Officer. The only guarantee in medicine is that nothing is guaranteed. It is important to note that the decision to proceed with this intervention was based on the information collected from the patient. The Data and conclusions were drawn from the patient's questionnaire, the interview, and the physical examination. Because the information was provided in large part by the patient, it cannot be guaranteed that it has not been purposely or unconsciously manipulated. Every effort has been made to obtain as much relevant data as possible for this evaluation. It is important to note that the conclusions that lead to this procedure are derived in large  part from the available data. Always take into account that the treatment will also be dependent on availability of resources and existing treatment guidelines, considered by other Pain Management Practitioners as being common knowledge and practice, at the time of the intervention. For Medico-Legal purposes, it is also important to point out that variation in procedural techniques and pharmacological choices are the acceptable norm. The indications, contraindications, technique, and results  of the above procedure should only be interpreted and judged by a Board-Certified Interventional Pain Specialist with extensive familiarity and expertise in the same exact procedure and technique.

## 2021-05-28 ENCOUNTER — Ambulatory Visit
Admission: RE | Admit: 2021-05-28 | Discharge: 2021-05-28 | Disposition: A | Discharge status: Home / Self Care | Payer: Medicaid Other | Source: Ambulatory Visit | Attending: Pain Medicine | Admitting: Pain Medicine

## 2021-05-28 ENCOUNTER — Ambulatory Visit: Payer: Medicaid Other | Admitting: Pain Medicine

## 2021-05-28 ENCOUNTER — Other Ambulatory Visit: Payer: Self-pay

## 2021-05-28 ENCOUNTER — Encounter: Payer: Self-pay | Admitting: Pain Medicine

## 2021-05-28 VITALS — BP 140/89 | HR 51 | Temp 97.3°F | Resp 15 | Ht 62.0 in | Wt 174.0 lb

## 2021-05-28 DIAGNOSIS — Z888 Allergy status to other drugs, medicaments and biological substances status: Secondary | ICD-10-CM | POA: Insufficient documentation

## 2021-05-28 DIAGNOSIS — M545 Low back pain, unspecified: Secondary | ICD-10-CM | POA: Diagnosis present

## 2021-05-28 DIAGNOSIS — M47817 Spondylosis without myelopathy or radiculopathy, lumbosacral region: Secondary | ICD-10-CM | POA: Diagnosis present

## 2021-05-28 DIAGNOSIS — Z7901 Long term (current) use of anticoagulants: Secondary | ICD-10-CM | POA: Insufficient documentation

## 2021-05-28 DIAGNOSIS — G8929 Other chronic pain: Secondary | ICD-10-CM | POA: Insufficient documentation

## 2021-05-28 DIAGNOSIS — M5136 Other intervertebral disc degeneration, lumbar region: Secondary | ICD-10-CM | POA: Insufficient documentation

## 2021-05-28 DIAGNOSIS — Z91041 Radiographic dye allergy status: Secondary | ICD-10-CM | POA: Insufficient documentation

## 2021-05-28 DIAGNOSIS — M47816 Spondylosis without myelopathy or radiculopathy, lumbar region: Secondary | ICD-10-CM | POA: Diagnosis present

## 2021-05-28 DIAGNOSIS — M4310 Spondylolisthesis, site unspecified: Secondary | ICD-10-CM | POA: Diagnosis present

## 2021-05-28 MED ORDER — MIDAZOLAM HCL 5 MG/5ML IJ SOLN
0.5000 mg | Freq: Once | INTRAMUSCULAR | Status: AC
  Administered 2021-05-28: 2 mg via INTRAVENOUS

## 2021-05-28 MED ORDER — LIDOCAINE HCL 2 % IJ SOLN
INTRAMUSCULAR | Status: AC
  Filled 2021-05-28: qty 20

## 2021-05-28 MED ORDER — ROPIVACAINE HCL 2 MG/ML IJ SOLN
INTRAMUSCULAR | Status: AC
  Filled 2021-05-28: qty 20

## 2021-05-28 MED ORDER — MIDAZOLAM HCL 5 MG/5ML IJ SOLN
INTRAMUSCULAR | Status: AC
  Filled 2021-05-28: qty 5

## 2021-05-28 MED ORDER — ROPIVACAINE HCL 2 MG/ML IJ SOLN
18.0000 mL | Freq: Once | INTRAMUSCULAR | Status: AC
  Administered 2021-05-28: 18 mL via PERINEURAL

## 2021-05-28 MED ORDER — TRIAMCINOLONE ACETONIDE 40 MG/ML IJ SUSP
80.0000 mg | Freq: Once | INTRAMUSCULAR | Status: AC
  Administered 2021-05-28: 80 mg

## 2021-05-28 MED ORDER — LIDOCAINE HCL 2 % IJ SOLN
20.0000 mL | Freq: Once | INTRAMUSCULAR | Status: AC
  Administered 2021-05-28: 400 mg

## 2021-05-28 MED ORDER — PENTAFLUOROPROP-TETRAFLUOROETH EX AERO
INHALATION_SPRAY | Freq: Once | CUTANEOUS | Status: AC
  Administered 2021-05-28: 30 via TOPICAL
  Filled 2021-05-28: qty 116

## 2021-05-28 MED ORDER — LACTATED RINGERS IV SOLN
1000.0000 mL | Freq: Once | INTRAVENOUS | Status: AC
  Administered 2021-05-28: 1000 mL via INTRAVENOUS

## 2021-05-28 MED ORDER — TRIAMCINOLONE ACETONIDE 40 MG/ML IJ SUSP
INTRAMUSCULAR | Status: AC
  Filled 2021-05-28: qty 2

## 2021-05-28 NOTE — Progress Notes (Signed)
Safety precautions to be maintained throughout the outpatient stay will include: orient to surroundings, keep bed in low position, maintain call bell within reach at all times, provide assistance with transfer out of bed and ambulation.  

## 2021-05-29 ENCOUNTER — Telehealth: Payer: Self-pay

## 2021-05-29 NOTE — Telephone Encounter (Signed)
Pt was called concerning procedure yesterday. No problem was reported.

## 2021-06-05 ENCOUNTER — Other Ambulatory Visit: Payer: Self-pay | Admitting: Sports Medicine

## 2021-06-05 DIAGNOSIS — Z1231 Encounter for screening mammogram for malignant neoplasm of breast: Secondary | ICD-10-CM

## 2021-06-07 DIAGNOSIS — M79601 Pain in right arm: Secondary | ICD-10-CM | POA: Insufficient documentation

## 2021-06-07 DIAGNOSIS — R102 Pelvic and perineal pain: Secondary | ICD-10-CM | POA: Insufficient documentation

## 2021-06-11 ENCOUNTER — Other Ambulatory Visit: Payer: Self-pay

## 2021-06-11 ENCOUNTER — Ambulatory Visit: Payer: Medicaid Other | Attending: Pain Medicine | Admitting: Pain Medicine

## 2021-06-11 DIAGNOSIS — M5442 Lumbago with sciatica, left side: Secondary | ICD-10-CM

## 2021-06-11 DIAGNOSIS — G894 Chronic pain syndrome: Secondary | ICD-10-CM | POA: Diagnosis not present

## 2021-06-11 DIAGNOSIS — M5136 Other intervertebral disc degeneration, lumbar region: Secondary | ICD-10-CM

## 2021-06-11 DIAGNOSIS — M47816 Spondylosis without myelopathy or radiculopathy, lumbar region: Secondary | ICD-10-CM

## 2021-06-11 DIAGNOSIS — M4310 Spondylolisthesis, site unspecified: Secondary | ICD-10-CM

## 2021-06-11 DIAGNOSIS — M51369 Other intervertebral disc degeneration, lumbar region without mention of lumbar back pain or lower extremity pain: Secondary | ICD-10-CM

## 2021-06-11 DIAGNOSIS — Z7901 Long term (current) use of anticoagulants: Secondary | ICD-10-CM

## 2021-06-11 DIAGNOSIS — M5416 Radiculopathy, lumbar region: Secondary | ICD-10-CM | POA: Diagnosis not present

## 2021-06-11 DIAGNOSIS — M961 Postlaminectomy syndrome, not elsewhere classified: Secondary | ICD-10-CM

## 2021-06-11 DIAGNOSIS — M5441 Lumbago with sciatica, right side: Secondary | ICD-10-CM

## 2021-06-11 DIAGNOSIS — G8929 Other chronic pain: Secondary | ICD-10-CM

## 2021-06-11 NOTE — Progress Notes (Signed)
Patient: Claire Mcdonald  Service Category: E/M  Provider: [name], MD  DOB: [birth year]  DOS: 06/11/2021  Location: Office  MRN: [id number]  Setting: Ambulatory outpatient  Referring Provider: [name], MD  Type: Established Patient  Specialty: Interventional Pain Management  PCP: [name], MD  Location: Remote location  Delivery: TeleHealth     Virtual Encounter - Pain Management PROVIDER NOTE: Information contained herein reflects review and annotations entered in association with encounter. Interpretation of such information and data should be left to medically-trained personnel. Information provided to patient can be located elsewhere in the medical record under "Patient Instructions". Document created using STT-dictation technology, any transcriptional errors that may result from process are unintentional.    Contact & Pharmacy Preferred: [phone] Home: [phone] (home) Mobile: [phone] (mobile) E-mail: [name]  [place], [place] [address] [place] [address] Phone: [phone] Fax: [phone]  [facility] OUT[facility]IENT [facility] - [place], [place] - [address] [place] [address] Phone: [phone] Fax: [phone]   Pre-screening  Claire Mcdonald offered "in-person" vs "virtual" encounter. She indicated preferring virtual for this encounter.   Reason COVID-19*  Social distancing based on CDC and AMA recommendations.   I contacted Claire Mcdonald on 06/11/2021 via telephone.      I clearly identified myself as [name], MD. I verified that I was speaking with the correct person using two identifiers (Name: Claire Mcdonald, and date of birth: Claire Mcdonald).  Consent I sought verbal advanced consent from Claire Mcdonald for virtual visit interactions. I informed Claire Mcdonald of possible security and privacy concerns, risks, and limitations associated with providing "not-in-person" medical  evaluation and management services. I also informed Claire Mcdonald of the availability of "in-person" appointments. Finally, I informed her that there would be a charge for the virtual visit and that she could be  personally, fully or partially, financially responsible for it. Claire Mcdonald expressed understanding and agreed to proceed.   Historic Elements   Claire Mcdonald is a 55 y.o. year old, female patient evaluated today after our last contact on 05/28/2021. 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 She also  has a past surgical history that includes Tubal ligation (1998); Ovarian cyst removal (Left); central line (03/25/2015); Hand surgery (Left); Cardiac catheterization (N/A, 03/27/2015); Cardiac catheterization (N/A, 04/11/2015); Cardiac catheterization (N/A, 07/16/2015); Colonoscopy with propofol (N/A, 10/01/2015); Esophagogastroduodenoscopy (egd) with propofol (N/A, 10/01/2015); Abdominal hysterectomy; and Hip Arthroplasty (Right, 02/27/2020). Claire Mcdonald has a current medication list which includes the following prescription(s): acetaminophen, albuterol, atorvastatin, azelastine, gnp calcium 1200, cetirizine, vitamin d3, diclofenac sodium, dicyclomine, fluticasone, furosemide, gabapentin, magnesium, metoprolol tartrate, polyethylene glycol, promethazine, rivaroxaban, senna-docusate, sertraline, topiramate, triamcinolone, famotidine, famotidine, and hydroxyzine. She  reports that she has never smoked. She has never used smokeless tobacco. She reports that she does not drink alcohol and does not use drugs. Claire Mcdonald is allergic to tape,  iodinated diagnostic agents, toradol [ketorolac tromethamine], tramadol, and zofran [ondansetron hcl].   HPI  Today, she is being contacted for a post-procedure assessment.  The patient indicates that again she attained 100% relief of the pain for the duration of the local  anesthetic which lasted for several days after which the pain relief wore off and the pain went back to baseline.  During today's appointment she notified us that she was now ready for that intrathecal pump implant and she wanted [place] to resume the process of the trial.  The patient was initially sent to [place] by Dr. Cari Caraway for that trial, but upon arriving to our clinics the patient indicated that she wanted to halt the process because she was not ready for an implant at that time.  Now she seems to have done all of her research and arrived to the conclusion that her best option at this time would be to proceed with that trial.  Post-Procedure Evaluation  Procedure (05/28/2021):  Procedure:           Anesthesia, Analgesia, Anxiolysis:  Type: Lumbar Facet, Medial Branch Block(s)          Primary Purpose: Diagnostic Region: Posterolateral Lumbosacral Spine Level: L2, L3, L4, L5, & S1 Medial Branch Level(s). Injecting these levels blocks the L3-4, L4-5, and L5-S1 lumbar facet joints. Laterality: Bilateral   Type: Local Anesthesia Local Anesthetic: Lidocaine 1-2% Sedation: Minimal Anxiolysis  Indication(s): Anxiety & Analgesia Route: Infiltration ([place]/IM) IV Access: Available     Position: Prone    Indications: 1. Lumbar facet syndrome (Bilateral) (R>L)   2. Spondylosis without myelopathy or radiculopathy, lumbosacral region   3. Chronic low back pain (Bilateral) w/o sciatica   4. DDD (degenerative disc disease), lumbar   5. Grade 2-3 (13-16 mm) Anterolisthesis of L5 over S1 (06/22/2019)   6. Lumbar facet arthropathy   7. Chronic anticoagulation (Xarelto)   8. History of Allergy to iodine   9. History of allergy to  radiographic contrast media     Pain Score: Pre-procedure: 8 /10 Post-procedure: 0-No pain/10     Anxiolysis: Please see nurses note.  Effectiveness during initial hour after procedure (Ultra-Short Term Relief): 100 %.  Local anesthetic used: Long-acting (4-6 hours) Effectiveness: Defined as any analgesic benefit obtained secondary to the administration of local anesthetics. This carries significant diagnostic value as to the etiological location, or anatomical origin, of the pain. Duration of benefit is expected to coincide with the duration of the local anesthetic used.  Effectiveness during initial 4-6 hours after procedure (Short-Term Relief): 100 %.  Long-term benefit: Defined as any relief past the pharmacologic duration of the local anesthetics.  Effectiveness past the initial 6 hours after procedure (Long-Term Relief): 100 % (pain relief last for a while but has returned to the same severeity.).  Benefits, current: Defined as benefit present at the time of this evaluation.   Analgesia: The patient indicates having attained 100% relief of the pain for several days, unfortunately the pain relief wore off and she is back to square 1. Function: Back to baseline ROM: Back to baseline  Pharmacotherapy Assessment   Analgesic: No opioid analgesics prescribed by our practice.  The patient received a letter from [facility] indicating that they would not be paying for her oxycodone prescriptions.   Monitoring: [place] PMP: PDMP reviewed during this encounter.       Pharmacotherapy: No side-effects or adverse reactions reported. Compliance: No problems identified. Effectiveness: Clinically acceptable. Plan: Refer to "POC". UDS: No results found for: SUMMARY   Laboratory Chemistry Profile   Renal Lab Results  Component Value Date   BUN 15 03/01/2020   CREATININE 0.68 03/01/2020   BCR 21 04/16/2015   GFRAA >60 03/01/2020   GFRNONAA >60 03/01/2020  Hepatic Lab Results  Component Value  Date   AST 28 02/25/2020   ALT 29 02/25/2020   ALBUMIN 3.9 02/25/2020   ALKPHOS 125 02/25/2020   LIPASE 29 03/27/2019    Electrolytes Lab Results  Component Value Date   NA 140 03/01/2020   K 3.7 03/01/2020   CL 107 03/01/2020   CALCIUM 8.2 (L) 03/01/2020   MG 2.3 02/27/2020   PHOS 3.8 02/19/2017    Bone Lab Results  Component Value Date   VD25OH 19.3 (L) 11/30/2017    Inflammation (CRP: Acute Phase) (ESR: Chronic Phase) Lab Results  Component Value Date   CRP <0.8 11/30/2017   ESRSEDRATE 19 11/30/2017   LATICACIDVEN 0.8 03/26/2015         Note: Above Lab results reviewed.  Imaging  DG PAIN CLINIC C-ARM 1-60 MIN NO REPORT Fluoro was used, but no Radiologist interpretation will be provided.  Please refer to "NOTES" tab for provider progress note.  Assessment  The primary encounter diagnosis was Chronic pain syndrome. Diagnoses of Chronic low back pain (1ry area of Pain) (Bilateral) (R>L) with sciatica (Bilateral), DDD (degenerative disc disease), lumbar, Chronic lumbar radicular pain (S1) (Bilateral), Failed back surgical syndrome, Grade 2-3 (13-16 mm) Anterolisthesis of L5 over S1 (06/22/2019), Lumbar facet syndrome (Bilateral) (R>L), Encounter for chronic pain management, and Chronic anticoagulation (Xarelto) were also pertinent to this visit.  Plan of Care  Problem-specific:  No problem-specific Assessment & Plan notes found for this encounter.  Ms. STORY CONTI has a current medication list which includes the following long-term medication(s): albuterol, atorvastatin, azelastine, gnp calcium 1200, cetirizine, fluticasone, furosemide, metoprolol tartrate, rivaroxaban, topiramate, famotidine, and famotidine.  Pharmacotherapy (Medications Ordered): No orders of the defined types were placed in this encounter.  Orders:  Orders Placed This Encounter  Procedures   PUMP TRIAL    Standing Status:   Future    Standing Expiration Date:   10/12/2021    Scheduling  Instructions:     Same day, outpatient trial. Schedule the patient to be the first case of the morning. Estimated for patient to be here for 4 to 6 hours. Have nurse order necessary medications. Have a spinal tray available.    Order Specific Question:   Where will this procedure be performed?    Answer:   [facility]   Blood Thinner Instructions to Nursing    Always make sure patient has clearance from prescribing physician to stop blood thinners for interventional therapies. If the patient requires a Lovenox-bridge therapy, make sure arrangements are made to institute it with the assistance of the PCP.    Scheduling Instructions:     Have Ms. Thurlow stop the Xarelto (Rivaroxaban) x 3 days prior to procedure or surgery.    Follow-up plan:   Return for [facility]) procedure: (ML) T9-10 ITP Trial, (NS), (Blood Thinner Protocol).       Interventional Therapies  Risk  Complexity Considerations:   Estimated body mass index is 31.83 kg/m as calculated from the following:   Height as of 05/28/21: '5\' 2"'  (1.575 m).   Weight as of 05/28/21: 174 lb (78.9 kg). Anticoagulation: Xarelto. (Stop: 3 days  Restart: 6 hrs)  Allergy: Iodine and radiological contrast  Implant: Internal cardiac defibrillator  History of noncompliance with post procedure follow-ups. (Prior patient of Dr. Mohammed Kindle) NOT a candidate for Lumbar RFA 2ry to [employer].    Planned  Pending:   Diagnostic midline T9-10 intrathecal injection of diagnostic substance for intrathecal pump trial.  Under consideration:   Initially the patient was referred to [place] by Dr. Cari Caraway for intrathecal pump trial and possible implant by Dr. Lacinda Axon.  However, at that time, the patient stopped the process indicating that she was not ready for an implant.  On 06/11/2021 she notified us that she was ready for the implant process to resume.   Completed:   Therapeutic bilateral lumbar facet MBB x7 (05/28/2021)  (100/100/100/0) Therapeutic bilateral L5 TFESI x2 (05/31/2019) Therapeutic right lumbar facet RFA x1 (02/07/2020) (DO NOT REPEAT - FUSION hardware)   Therapeutic  Palliative (PRN) options:   Palliative bilateral lumbar facet MBB #8  Palliative bilateral L5 TFESI #3     Recent Visits Date Type Provider Dept  05/28/21 Procedure visit Milinda Pointer, MD [facility]  05/21/21 Office Visit Milinda Pointer, MD [facility]  03/19/21 Telemedicine Milinda Pointer, MD [facility]  Showing recent visits within past 90 days and meeting all other requirements Today's Visits Date Type Provider Dept  06/11/21 Office Visit Milinda Pointer, MD [facility]  Showing today's visits and meeting all other requirements Future Appointments No visits were found meeting these conditions. Showing future appointments within next 90 days and meeting all other requirements I discussed the assessment and treatment plan with the patient. The patient was provided an opportunity to ask questions and all were answered. The patient agreed with the plan and demonstrated an understanding of the instructions.  Patient advised to call back or seek an in-person evaluation if the symptoms or condition worsens.  Duration of encounter: 16 minutes.  Note by: [name], MD Date: 06/11/2021; Time: 11:48 AM

## 2021-06-12 ENCOUNTER — Telehealth: Payer: Self-pay

## 2021-06-12 NOTE — Telephone Encounter (Signed)
Pt called and states she's going to change her mind about having the LESI procedure

## 2021-06-25 ENCOUNTER — Other Ambulatory Visit: Payer: Self-pay

## 2021-06-25 ENCOUNTER — Ambulatory Visit
Admission: RE | Admit: 2021-06-25 | Discharge: 2021-06-25 | Disposition: A | Discharge status: Home / Self Care | Payer: Medicaid Other | Source: Ambulatory Visit | Attending: Sports Medicine | Admitting: Sports Medicine

## 2021-06-25 DIAGNOSIS — Z1231 Encounter for screening mammogram for malignant neoplasm of breast: Secondary | ICD-10-CM | POA: Insufficient documentation

## 2021-07-22 DIAGNOSIS — M898X9 Other specified disorders of bone, unspecified site: Secondary | ICD-10-CM | POA: Insufficient documentation

## 2021-07-22 DIAGNOSIS — G479 Sleep disorder, unspecified: Secondary | ICD-10-CM | POA: Insufficient documentation

## 2021-08-29 NOTE — Progress Notes (Signed)
PROVIDER NOTE: Information contained herein reflects review and annotations entered in association with encounter. Interpretation of such information and data should be left to medically-trained personnel. Information provided to patient can be located elsewhere in the medical record under "Patient Instructions". Document created using STT-dictation technology, any transcriptional errors that may result from process are unintentional.    Patient: Claire Mcdonald  Service Category: E/M  Provider: [name], MD  DOB: [birth year]  DOS: 09/02/2021  Specialty: Interventional Pain Management  MRN: [id number]  Setting: Ambulatory outpatient  PCP: [name], MD  Type: Established Patient    Referring Provider: [name], MD  Location: Office  Delivery: Face-to-face     HPI  Claire Mcdonald, a 56 y.o. year old female, is here today because of her Chronic right-sided low back pain without sciatica [M54.50, G89.29]. Claire Mcdonald primary complain today is Back Pain (lower) Last encounter: My last encounter with her was on 06/11/2021. 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[place]); Chronic low back pain (1ry area of Pain) (Bilateral) (R>L) with sciatica (Bilateral); Chronic lower extremity pain (2ry area of Pain) (Bilateral) (R>L); Chronic pain syndrome; Grade 2-3 (13-16 mm) Anterolisthesis of L5 over S1 (06/22/2019); Lumbar foraminal stenosis (L5-S1) (Bilateral); Chronic lumbar radicular pain (S1) (Bilateral); Chronic musculoskeletal pain; Neurogenic pain; Spinal instability of lumbosacral region (L5-S1) (11/30/17); Lumbosacral radiculopathy at S1; Abdominal pain; Female pelvic pain; Lower extremity edema; Lymphedema of both lower extremities; Restless leg syndrome; Lumbar facet syndrome  (Bilateral) (R>L); Spondylosis without myelopathy or radiculopathy, lumbosacral region; Other headache syndrome; Pars defect of lumbar spine; Myalgia; Pain in joint, lower leg; Chronic knee pain (Right); Failed back surgical syndrome; Abdominal mass; Fracture of femoral neck, right, closed ([place]); Localized edema; Osteoarthritis of both knees; S/P ORIF (open reduction internal fixation) fracture; Neck pain; Bilateral hip pain; Chronic low back pain (Bilateral) w/o sciatica; Patellofemoral stress syndrome; History of hemiarthroplasty of right hip; Abnormal CT scan, lumbar spine (02/25/2020); and Chronic low back pain (Right) w/o sciatica on their pertinent problem list. Pain Assessment: Severity of Chronic pain is reported as a 9 /10. Location: Back Right/Pain radiaties down right side. Onset: More than a month ago. Quality: Aching, Burning, Constant, Stabbing, Shooting. Timing: Constant. Modifying factor(s):  Marland Kitchen Vitals:  height is $RemoveB'5\' 2"'ZMsOOkou$  (1.575 m) (pended) and weight is 167 lb (75.8 kg) (pended). Her temperature is 97.1 F (36.2 C) (abnormal, pended). Her blood pressure is 146/91 (abnormal) and her pulse is 62. Her respiration is 15 and oxygen saturation is 100%.   Reason for encounter: evaluation of worsening, or previously known (established) problem.  The patient indicates increasing the low back pain.  The patient returns to the clinic today indicating that she is having recurrence of her low back pain.  However, this pain is limited to the right side today.  She has requested to come in for a palliative lumbar facet block.  She indicates that the pain that she is experiencing is the same that she has experienced on prior occasions.  She also indicated that she thought about the intrathecal pump and she has decided not to have it done.  Pharmacotherapy Assessment  Analgesic: No opioid analgesics prescribed by our practice.  The patient received a letter from [facility] indicating that they would not be paying  for her oxycodone prescriptions.   Monitoring: [place]  PMP: PDMP reviewed during this encounter.       Pharmacotherapy: No side-effects or adverse reactions reported. Compliance: No problems identified. Effectiveness: Clinically acceptable.  Chauncey Fischer, RN  09/02/2021  8:43 AM  Sign when Signing Visit Safety precautions to be maintained throughout the outpatient stay will include: orient to surroundings, keep bed in low position, maintain call bell within reach at all times, provide assistance with transfer out of bed and ambulation.     UDS:  No results found for: SUMMARY   ROS  Constitutional: Denies any fever or chills Gastrointestinal: No reported hemesis, hematochezia, vomiting, or acute GI distress Musculoskeletal: Denies any acute onset joint swelling, redness, loss of ROM, or weakness Neurological: No reported episodes of acute onset apraxia, aphasia, dysarthria, agnosia, amnesia, paralysis, loss of coordination, or loss of consciousness  Medication Review  GNP Calcium 1200, Magnesium, Vitamin D3, acetaminophen, albuterol, atorvastatin, azelastine, cetirizine, dicyclomine, famotidine, fluticasone, furosemide, gabapentin, hydrOXYzine, metoprolol tartrate, polyethylene glycol, promethazine, rivaroxaban, senna-docusate, sertraline, topiramate, and triamcinolone  History Review  Allergy: Claire Mcdonald is allergic to tape, iodinated contrast media, toradol [ketorolac tromethamine], tramadol, and zofran [ondansetron hcl]. Drug: Claire Mcdonald  reports no history of drug use. Alcohol:  reports no history of alcohol use. Tobacco:  reports that she has never smoked. She has never used smokeless tobacco. Social: [name]  reports that she has never smoked. She has never used smokeless tobacco. She reports that she does not drink alcohol and does not use drugs. 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 Surgical: Claire Mcdonald  has a past surgical history that includes Tubal ligation (1998); Ovarian cyst removal (Left); central line (03/25/2015); Hand surgery (Left); Cardiac catheterization (N/A, 03/27/2015); Cardiac catheterization (N/A, 04/11/2015); Cardiac catheterization (N/A, 07/16/2015); Colonoscopy with propofol (N/A, 10/01/2015); Esophagogastroduodenoscopy (egd) with propofol (N/A, 10/01/2015); Abdominal hysterectomy; and Hip Arthroplasty (Right, 02/27/2020). Family: family history includes Allergies in her daughter, son, and son; Asthma in her father and mother; Breast cancer in her maternal grandmother and paternal aunt; Cancer in her maternal grandmother; Depression in her mother; Diabetes in her father; Early death in her maternal grandmother and paternal grandmother; Heart disease in her son; Hyperlipidemia in her father and mother; Hypertension in her father and mother; Kidney disease in her father.  Laboratory Chemistry Profile   Renal Lab Results  Component Value Date   BUN 15 03/01/2020   CREATININE 0.68 03/01/2020   BCR 21 04/16/2015   GFRAA >60 03/01/2020   GFRNONAA >60 03/01/2020    Hepatic Lab Results  Component Value Date   AST 28 02/25/2020   ALT 29 02/25/2020   ALBUMIN 3.9 02/25/2020   ALKPHOS 125 02/25/2020   LIPASE 29 03/27/2019    Electrolytes Lab Results  Component Value Date   NA 140 03/01/2020   K 3.7 03/01/2020   CL 107 03/01/2020   CALCIUM 8.2 (L) 03/01/2020   MG 2.3 02/27/2020   PHOS 3.8 02/19/2017    Bone Lab Results  Component Value Date   VD25OH 19.3 (L) 11/30/2017    Inflammation (CRP: Acute Phase) (ESR: Chronic Phase) Lab Results  Component Value Date   CRP <0.8  11/30/2017   ESRSEDRATE 19 11/30/2017   LATICACIDVEN 0.8 03/26/2015  Note: Above Lab results reviewed.  Recent Imaging Review  MM 3D SCREEN BREAST BILATERAL CLINICAL DATA:  Screening.  EXAM: DIGITAL SCREENING BILATERAL MAMMOGRAM WITH TOMOSYNTHESIS AND CAD  TECHNIQUE: Bilateral screening digital craniocaudal and mediolateral oblique mammograms were obtained. Bilateral screening digital breast tomosynthesis was performed. The images were evaluated with computer-aided detection.  COMPARISON:  Previous exam(s).  ACR Breast Density Category b: There are scattered areas of fibroglandular density.  FINDINGS: There are no findings suspicious for malignancy.  IMPRESSION: No mammographic evidence of malignancy. A result letter of this screening mammogram will be mailed directly to the patient.  RECOMMENDATION: Screening mammogram in one year. (Code:[id number]-B-01Y)  BI-RADS CATEGORY  1: Negative.  Electronically Signed   By: Audie Pinto M.D.   On: 06/26/2021 12:07 Note: Reviewed        Physical Exam  General appearance: Well nourished, well developed, and well hydrated. In no apparent acute distress Mental status: Alert, oriented x 3 (person, place, & time)       Respiratory: No evidence of acute respiratory distress Eyes: PERLA Vitals: BP (!) 146/91    Pulse 62    Temp (!) (P) 97.1 F (36.2 C)    Resp 15    Ht (P) _0  (1.575 m)    Wt (P) 167 lb (75.8 kg)    LMP 01/08/2018    SpO2 100%    BMI (P) 30.54 kg/m  BMI: Estimated body mass index is 30.54 kg/m (pended) as calculated from the following:   Height as of this encounter: (P) _1  (1.575 m).   Weight as of this encounter: (P) 167 lb (75.8 kg). Ideal: Patient weight not recorded  Assessment   Status Diagnosis  Controlled Controlled Controlled 1. Chronic low back pain (Right) w/o sciatica   2. Lumbar facet syndrome (Bilateral) (R>L)   3. Grade 2-3 (13-16 mm) Anterolisthesis of L5 over S1 (06/22/2019)    4. Lumbar facet arthropathy   5. DDD (degenerative disc disease), lumbar   6. Chronic anticoagulation (Xarelto)      Updated Problems: Problem  Chronic low back pain (Right) w/o sciatica  Spinal instability of lumbosacral region (L5-S1) (11/30/17)   (11/30/2017) lumbar spine flexion and extension x-rays.  IMPRESSION: BILATERAL L5 spondylolysis with significant spondylolisthesis L5 on S1, grade 2-3, measuring 13 mm in neutral and extension, increasing to 16 mm in flexion. - Lumbar spine surgery: ALIF L4-5, PSF L4-S1 (Dr. Elayne Guerin) (09/29/2019)    Moderate Episode of Recurrent Major Depressive Disorder ([facility])   Per Behavioral Outreach note 09/2017 Patient with history of depression, currently on max dose Sertraline and low-dose Amitriptyline.  Also with a history of CVA and reports of memory issues that are bothersome to her.  Mild anxiety symptoms.  Given currently at max dose Sertraline, options to try and improve depression pharmacologically would be to either switch agents or augmentation.  If she has had benefit from Sertraline at all, would suggest augmentation.  If no benefit from Sertraline, consider switch to alternative SSRI, such as Fluoxetine. Considerations: 1) Augmentation of Sertraline with Buspirone.  Could start at 10 mg BID for 1 week, then increase to 15 mg BID.  Can be titrated up to max dose of 30 mg BID.   2) If decide to switch from Sertraline, consider cross-taper to Fluoxetine.  Decrease Sertraline to 100 mg daily x 1 week, while at same time start Fluoxetine 20 mg daily.  Second week, decrease Sertraline to 50 mg daily, and increase Fluoxetine to 30 mg  daily.  Third week, d/c Sertraline and increase Fluoxetine to 40 mg daily 3) Patient is on Topamax, which is notorious for having a side effect of memory difficulty and mental "fogginess."  If these symptoms are bothersome, consider referral to Neurology for discussion of alternative agent for seizure control  Last  Assessment & Plan:  - uncontrolled depression and anxiety - complicated by h/o CVA - patient having multiple perimenopausal symptoms which also including insomnia - stop zoloft 281m daily (which can worsen hot flashes and is not recmomended for hot flashes) - start celexa 438mdaily - continue amitriptyline 2575mhs prn - consider augmentation with buspar 33m60mD x 1 week, then 15mg27m (max dose 30mg 29m - also consider fluoxetine - on topamax for seizures.  Has known SE of memory difficulty and mental "fogginess."  If these symptoms are bothersome, consider referral to Neurology for discussion of alternative agent for seizure control  Per Behavioral Outreach note 09/2017 Patient with history of depression, currently on max dose Sertraline and low-dose Amitriptyline.  Also with a history of CVA and reports of memory issues that are bothersome to her.  Mild anxiety symptoms.  Given currently at max dose Sertraline, options to try and improve depression pharmacologically would be to either switch agents or augmentation.  If she has had benefit from Sertraline at all, would suggest augmentation.  If no benefit from Sertraline, consider switch to alternative SSRI, such as Fluoxetine. Considerations: 1) Augmentation of Sertraline with Buspirone.  Could start at 10 mg BID for 1 week, then increase to 15 mg BID.  Can be titrated up to max dose of 30 mg BID.   2) If decide to switch from Sertraline, consider cross-taper to Fluoxetine.  Decrease Sertraline to 100 mg daily x 1 week, while at same time start Fluoxetine 20 mg daily.  Second week, decrease Sertraline to 50 mg daily, and increase Fluoxetine to 30 mg daily.  Third week, d/c Sertraline and increase Fluoxetine to 40 mg daily 3) Patient is on Topamax, which is notorious for having a side effect of memory difficulty and mental "fogginess."  If these symptoms are bothersome, consider referral to Neurology for discussion of alternative agent for  seizure control  Last Assessment & Plan:  Mood improved with switch from zoloft to celexa. Given patient's age, feel that 40 mg daily is max dose we should use. Discussed this today. Patient feels that her mood is stable and in a good place. We discussed possible augmentation with buspar, however patient declines at this time. She would like to start therapy, [facility] [place]. F/u in one month.  Formatting of this note might be different from the original. Formatting of this note might be different from the original. Per Behavioral Outreach note 09/2017 Patient with history of depression, currently on max dose Sertraline and low-dose Amitriptyline.  Also with a history of CVA and reports of memory issues that are bothersome to her.  Mild anxiety symptoms.  Given currently at max dose Sertraline, options to try and improve depression pharmacologically would be to either switch agents or augmentation.  If she has had benefit from Sertraline at all, would suggest augmentation.  If no benefit from Sertraline, consider switch to alternative SSRI, such as Fluoxetine. Considerations: 1) Augmentation of Sertraline with Buspirone.  Could start at 10 mg BID for 1 week, then increase to 15 mg BID.  Can be titrated up to max dose of 30 mg BID.   2) If decide to switch from Sertraline, consider cross-taper to Fluoxetine.  Decrease Sertraline to 100 mg daily x 1 week, while at same time start Fluoxetine 20 mg daily.  Second week, decrease Sertraline to 50 mg daily, and increase Fluoxetine to 30 mg daily.  Third week, d/c Sertraline and increase Fluoxetine to 40 mg daily 3) Patient is on Topamax, which is notorious for having a side effect of memory difficulty and mental "fogginess."  If these symptoms are bothersome, consider referral to Neurology for discussion of alternative agent for seizure  control  Last Assessment & Plan:  Formatting of this note might be different from the original. ASSESSMENT: Long hx of depression due to multiple reason has been on multiple meds with varying responses  Not happy with celexa keeps her sleepy and no change in her mood  Would like to switch back to zoloft  PLAN: Will stop celexa and swap to Zoloft  Will start with $RemoveBe'100mg'gyqZqFDta$  PO QD Formatting of this note might be different from the original. Per Behavioral Outreach note 09/2017 Patient with history of depression, currently on max dose Sertraline and low-dose Amitriptyline.  Also with a history of CVA and reports of memory issues that are bothersome to her.  Mild anxiety symptoms.  Given currently at max dose Sertraline, options to try and improve depression pharmacologically would be to either switch agents or augmentation.  If she has had benefit from Sertraline at all, would suggest augmentation.  If no benefit from Sertraline, consider switch to alternative SSRI, such as Fluoxetine. Considerations: 1) Augmentation of Sertraline with Buspirone.  Could start at 10 mg BID for 1 week, then increase to 15 mg BID.  Can be titrated up to max dose of 30 mg BID.   2) If decide to switch from Sertraline, consider cross-taper to Fluoxetine.  Decrease Sertraline to 100 mg daily x 1 week, while at same time start Fluoxetine 20 mg daily.  Second week, decrease Sertraline to 50 mg daily, and increase Fluoxetine to 30 mg daily.  Third week, d/c Sertraline and increase Fluoxetine to 40 mg daily 3) Patient is on Topamax, which is notorious for having a side effect of memory difficulty and mental "fogginess."  If these symptoms are bothersome, consider referral to Neurology for discussion of alternative agent for seizure control  Last Assessment & Plan:  Formatting of this note might be different from the original. ASSESSMENT: Long hx of depression due to multiple reason has been on multiple meds with varying  responses  Not happy with celexa keeps her sleepy and no change in her mood  Would like to switch back to zoloft  PLAN: Will stop celexa and swap to Zoloft  Will start with $RemoveBe'100mg'LPzWEmGKe$  PO QD  Formatting of this note might be different from the original. Per Behavioral Outreach note 09/2017 Patient with history of depression, currently on max dose Sertraline and low-dose Amitriptyline.  Also with a history of CVA and reports of memory issues that are bothersome to her.  Mild anxiety symptoms.  Given currently at max dose Sertraline, options to try and improve depression pharmacologically would be to either switch agents or augmentation.  If she has had benefit from Sertraline at all, would suggest augmentation.  If no benefit from Sertraline, consider switch to alternative SSRI, such as Fluoxetine. Considerations: 1) Augmentation of Sertraline with Buspirone.  Could start at 10 mg BID for 1 week, then increase  to 15 mg BID.  Can be titrated up to max dose of 30 mg BID.   2) If decide to switch from Sertraline, consider cross-taper to Fluoxetine.  Decrease Sertraline to 100 mg daily x 1 week, while at same time start Fluoxetine 20 mg daily.  Second week, decrease Sertraline to 50 mg daily, and increase Fluoxetine to 30 mg daily.  Third week, d/c Sertraline and increase Fluoxetine to 40 mg daily 3) Patient is on Topamax, which is notorious for having a side effect of memory difficulty and mental "fogginess."  If these symptoms are bothersome, consider referral to Neurology for discussion of alternative agent for seizure control  Last Assessment & Plan:  Formatting of this note might be different from the original. Pt states mood has been stable, but struggles w/ depressive episodes 2/2 chronic pain.   Plan: refill zoloft today   Essential Hypertension   Last Assessment & Plan:   Stable, continue current management  Last Assessment & Plan:  ASSESSMENT: Hx of HTN, heart failure  Needs refills  on her lasix  PLAN: Ordered her refills  Last Assessment & Plan:  Formatting of this note might be different from the original. ASSESSMENT: Hx of HTN, heart failure  Needs refills on her lasix  PLAN: Ordered her refills  Last Assessment & Plan:  Formatting of this note might be different from the original. Condition is stable Medication and treatment plan as described in orders or med list. Counseled on etiology, treatment, warning signs. Patient education handout provided.  Last Assessment & Plan:  Formatting of this note is different from the original. Pt has had mildly elevated BP recently. Pt takes metoprolol 140m daily. Has been on lisinopril in the past, but had intermittent low Bps. Seems as if BP is sustained elevated, so will starte lisinopril today.  BP Readings from Last 3 Encounters:  06/07/21 142/99  05/22/21 140/91  04/11/21 147/100   Plan: refill metoprolol. Start lisinopril 557m Follow up in 4-6 weeks.   Sleep Disorder   Last Assessment & Plan:  Formatting of this note might be different from the original. ASSESSMENT: Poor sleep that is characterized by being woke up due to pain and then not being able to fall back asleep  Gabapentin helps with pain and sleep but makes groggy in the AM  PLAN Will look to titrate the gabapentin to help with pain and not make groggy  Will try some trazodone in place of the gabapentin at night   Bone Pain   Last Assessment & Plan:  Formatting of this note might be different from the original. ASSESSMENT: [age]o F who has some developed some worsening bone pain in multiple areas of the body unclear as to the etiology and it is impairing her ability to sleep as it wakes her up  Will draw some blood to look into a variety of etiologies and get a bone density   Mammo is UTD  PLAN: Will check some labs to assess etiologies and follow up   Right Arm Pain   Last Assessment & Plan:  Formatting of this note might be  different from the original. Pt feels lump and pain in her right upper arm, which she thinks may be related to cancer due to family hx of leukemia. The pain is severe and is worse w/ palpation of her right arm. Pt also has diffuse "bony" pain, which is tender to palpation w/ focal exquisitely tender points. On exam, there is a likely muscular  knot that is felt, but no obvious bony deformity.   Plan: X-ray of right humerus. Consider gabapentin -> lyrica in the future (pt states this worked better for her in the past, but insurance does not cover lyrica)   Vaginal Pain   Last Assessment & Plan:  Formatting of this note might be different from the original. Pt has been having vaginal pain every since her hysterectomy. Has not been sexually active since then. Exam w/ no evidence of inflammatory changes or discharge.   Plan: GC, wet prep, trichomonas sent, pelvic floor PT   Healthcare Maintenance   Last Assessment & Plan:  Formatting of this note might be different from the original. Lipid panel, A1c, influenza vaccination     Plan of Care  Problem-specific:  No problem-specific Assessment & Plan notes found for this encounter.  Claire Mcdonald has a current medication list which includes the following long-term medication(s): albuterol, atorvastatin, furosemide, metoprolol tartrate, rivaroxaban, topiramate, azelastine, gnp calcium 1200, cetirizine, famotidine, famotidine, and fluticasone.  Pharmacotherapy (Medications Ordered): No orders of the defined types were placed in this encounter.  Orders:  Orders Placed This Encounter  Procedures   LUMBAR FACET(MEDIAL BRANCH NERVE BLOCK) MBNB    Standing Status:   Future    Standing Expiration Date:   12/01/2021    Scheduling Instructions:     Procedure: Lumbar facet block (AKA.: Lumbosacral medial branch nerve block)     Side: Right-sided     Level: L3-4, L4-5, & L5-S1 Facets (L2, L3, L4, L5, & S1 Medial Branch Nerves)     Sedation:  Patient's choice.     Timeframe: ASAA    Order Specific Question:   Where will this procedure be performed?    Answer:   [facility]   Blood Thinner Instructions to Nursing    Always make sure patient has clearance from prescribing physician to stop blood thinners for interventional therapies. If the patient requires a Lovenox-bridge therapy, make sure arrangements are made to institute it with the assistance of the PCP.    Scheduling Instructions:     Have Claire Mcdonald stop the Xarelto (Rivaroxaban) x 3 days prior to procedure or surgery.   Follow-up plan:   Return for (Clinic) procedure: (R) L-FCT Blk, (Sed-anx), (Blood Thinner Protocol).     Interventional Therapies  Risk   Complexity Considerations:   Estimated body mass index is 31.83 kg/m as calculated from the following:   Height as of 05/28/21: _0  (1.575 m).   Weight as of 05/28/21: 174 lb (78.9 kg).   Anticoagulation: Xarelto. (Stop: 3 days   Restart: 6 hrs)  Allergy: Iodine and radiological contrast  Implant: Internal cardiac defibrillator  History of noncompliance with post procedure follow-ups. (Prior patient of Dr. Mohammed Kindle) NOT a candidate for Lumbar RFA 2ry to [employer].   Planned   Pending:   Palliative right lumbar facet MBB    Under consideration:   Initially sent by Dr. Cari Caraway for intrathecal pump trial.  On initial evaluation she was not sure about it and postponed it.  Around 06/11/2021 she thought that she was ready for it and asked [place] to proceed however, on 09/02/2021 she notified us that she definitely does not want to proceed with the pump.    Completed:   Lumbar spine surgery: ALIF L4-5, PSF L4-S1 (Dr. Elayne Guerin) (09/29/2019)  Therapeutic bilateral lumbar facet MBB x7 (05/28/2021) (100/100/100/100 times several days/0)  Therapeutic bilateral L5 TFESI x2 (05/31/2019) (80/80/80/>75)  Therapeutic  right lumbar facet RFA x1 (02/07/2020) (DO NOT REPEAT - FUSION hardware)   Completed  by Dr. Primus Bravo:   Diagnostic right lumbar facet MBB x1 (03/12/2015) (n/a)  Diagnostic/therapeutic right SI joint nerves block x3 (02/05/2015) (n/a)    Therapeutic   Palliative (PRN) options:   Palliative lumbar facet MBB (for low back pain)  Palliative L5 TFESI (for radicular lower extremity pain)     Recent Visits Date Type Provider Dept  06/11/21 Office Visit Milinda Pointer, MD [facility]  Showing recent visits within past 90 days and meeting all other requirements Today's Visits Date Type Provider Dept  09/02/21 Office Visit Milinda Pointer, MD [facility]  Showing today's visits and meeting all other requirements Future Appointments Date Type Provider Dept  09/12/21 Appointment Milinda Pointer, MD [facility]  Showing future appointments within next 90 days and meeting all other requirements  I discussed the assessment and treatment plan with the patient. The patient was provided an opportunity to ask questions and all were answered. The patient agreed with the plan and demonstrated an understanding of the instructions.  Patient advised to call back or seek an in-person evaluation if the symptoms or condition worsens.  Duration of encounter: 30 minutes.  Note by: [name], MD Date: 09/02/2021; Time: 9:04 AM

## 2021-09-02 ENCOUNTER — Other Ambulatory Visit: Payer: Self-pay

## 2021-09-02 ENCOUNTER — Ambulatory Visit: Payer: Medicaid Other | Attending: Pain Medicine | Admitting: Pain Medicine

## 2021-09-02 ENCOUNTER — Encounter: Payer: Self-pay | Admitting: Pain Medicine

## 2021-09-02 VITALS — BP 146/91 | HR 62 | Resp 15

## 2021-09-02 DIAGNOSIS — M4310 Spondylolisthesis, site unspecified: Secondary | ICD-10-CM | POA: Diagnosis present

## 2021-09-02 DIAGNOSIS — M51369 Other intervertebral disc degeneration, lumbar region without mention of lumbar back pain or lower extremity pain: Secondary | ICD-10-CM

## 2021-09-02 DIAGNOSIS — M545 Low back pain, unspecified: Secondary | ICD-10-CM | POA: Diagnosis present

## 2021-09-02 DIAGNOSIS — M47816 Spondylosis without myelopathy or radiculopathy, lumbar region: Secondary | ICD-10-CM | POA: Diagnosis present

## 2021-09-02 DIAGNOSIS — Z7901 Long term (current) use of anticoagulants: Secondary | ICD-10-CM

## 2021-09-02 DIAGNOSIS — M5136 Other intervertebral disc degeneration, lumbar region: Secondary | ICD-10-CM | POA: Diagnosis present

## 2021-09-02 DIAGNOSIS — G8929 Other chronic pain: Secondary | ICD-10-CM

## 2021-09-02 NOTE — Progress Notes (Signed)
Safety precautions to be maintained throughout the outpatient stay will include: orient to surroundings, keep bed in low position, maintain call bell within reach at all times, provide assistance with transfer out of bed and ambulation.  

## 2021-09-04 DIAGNOSIS — Z993 Dependence on wheelchair: Secondary | ICD-10-CM | POA: Insufficient documentation

## 2021-09-04 DIAGNOSIS — M4327 Fusion of spine, lumbosacral region: Secondary | ICD-10-CM | POA: Insufficient documentation

## 2021-09-04 NOTE — Progress Notes (Signed)
PROVIDER NOTE: Interpretation of information contained herein should be left to medically-trained personnel. Specific patient instructions are provided elsewhere under "Patient Instructions" section of medical record. This document was created in part using STT-dictation technology, any transcriptional errors that may result from this process are unintentional.  Patient: Claire Mcdonald Type: Established DOB: [birth year] MRN: [id number] PCP: [name], MD  Service: Procedure DOS: 09/05/2021 Setting: Ambulatory Location: Ambulatory outpatient facility Delivery: Face-to-face Provider: [name], MD Specialty: Interventional Pain Management Specialty designation: 09 Location: Outpatient facility Ref. Prov.: [name], MD    Primary Reason for Visit: Interventional Pain Management Treatment. CC: Back Pain (Low and right)  Procedure:           Type: Lumbar Facet, Medial Branch Block(s) #1  Laterality: Right Level: L2, L3, L4, L5, & S1 Medial Branch Level(s). Injecting these levels blocks the L3-4, L4-5, and L5-S1 lumbar facet joints. Imaging: Fluoroscopic guidance Anesthesia: Local anesthesia (1-2% Lidocaine) Anxiolysis: IV (IV Versed) Sedation: None.  DOS: 09/05/2021 Performed by: [name], MD  Primary Purpose: Palliative Indications: Low back pain severe enough to impact quality of life or function. 1. Lumbar facet syndrome (Bilateral) (R>L)   2. DDD (degenerative disc disease), lumbar   3. Lumbar facet arthropathy   4. Grade 2-3 (13-16 mm) Anterolisthesis of L5 over S1 (06/22/2019)   5. Chronic low back pain (Bilateral) w/o sciatica   6. Fusion of lumbosacral spine   7. Wheelchair dependent   8. Chronic anticoagulation (Xarelto)    NAS-11 Pain score:   Pre-procedure: 9 /10   Post-procedure: 0-No pain/10     Position / Prep / Materials:  Position: Prone  Prep solution: DuraPrep (Iodine Povacrylex [0.7% available iodine] and Isopropyl Alcohol, 74%  w/w) Area Prepped: Posterolateral Lumbosacral Spine (Wide prep: From the lower border of the scapula down to the end of the tailbone and from flank to flank.)  Materials:  Tray: Block Needle(s):  Type: Spinal  Gauge (G): 22  Length: 5-in Qty: 4  Pre-op H&P Assessment:  Claire Mcdonald is a 56 y.o. (year old), female patient, seen today for interventional treatment. She  has a past surgical history that includes Tubal ligation (1998); Ovarian cyst removal (Left); central line (03/25/2015); Hand surgery (Left); Cardiac catheterization (N/A, 03/27/2015); Cardiac catheterization (N/A, 04/11/2015); Cardiac catheterization (N/A, 07/16/2015); Colonoscopy with propofol (N/A, 10/01/2015); Esophagogastroduodenoscopy (egd) with propofol (N/A, 10/01/2015); Abdominal hysterectomy; and Hip Arthroplasty (Right, 02/27/2020). [name] has a current medication list which includes the following prescription(s): acetaminophen, albuterol, atorvastatin, azelastine, gnp calcium 1200, cetirizine, vitamin d3, dicyclomine, fluticasone, furosemide, gabapentin, hydroxyzine, magnesium, metoprolol tartrate, polyethylene glycol, promethazine, rivaroxaban, senna-docusate, sertraline, topiramate, triamcinolone, famotidine, and famotidine, and the following Facility-Administered Medications: pentafluoroprop-tetrafluoroeth. Her primarily concern today is the Back Pain (Low and right)  Initial Vital Signs:  Pulse/HCG Rate: (!) 58ECG Heart Rate: (!) 52 Temp: 97.8 F (36.6 C) Resp: 18 BP: (!) 140/96 SpO2: 99 %  BMI: Estimated body mass index is 30.73 kg/m as calculated from the following:   Height as of this encounter: 5\' 2"  (1.575 m).   Weight as of this encounter: 168 lb (76.2 kg).  Risk Assessment: Allergies: Reviewed. She is allergic to tape, iodinated contrast media, toradol [ketorolac tromethamine], tramadol, and zofran [ondansetron hcl].  Allergy Precautions: None required Coagulopathies: Reviewed. None identified.  Blood-thinner  therapy: None at this time Active Infection(s): Reviewed. None identified. Claire Mcdonald is afebrile  Site Confirmation: Claire Mcdonald was asked to confirm the procedure and laterality before marking the  site Procedure checklist: Completed Consent: Before the procedure and under the influence of no sedative(s), amnesic(s), or anxiolytics, the patient was informed of the treatment options, risks and possible complications. To fulfill our ethical and legal obligations, as recommended by the American Medical Association's Code of Ethics, I have informed the patient of my clinical impression; the nature and purpose of the treatment or procedure; the risks, benefits, and possible complications of the intervention; the alternatives, including doing nothing; the risk(s) and benefit(s) of the alternative treatment(s) or procedure(s); and the risk(s) and benefit(s) of doing nothing. The patient was provided information about the general risks and possible complications associated with the procedure. These may include, but are not limited to: failure to achieve desired goals, infection, bleeding, organ or nerve damage, allergic reactions, paralysis, and death. In addition, the patient was informed of those risks and complications associated to Spine-related procedures, such as failure to decrease pain; infection (i.e.: Meningitis, epidural or intraspinal abscess); bleeding (i.e.: epidural hematoma, subarachnoid hemorrhage, or any other type of intraspinal or peri-dural bleeding); organ or nerve damage (i.e.: Any type of peripheral nerve, nerve root, or spinal cord injury) with subsequent damage to sensory, motor, and/or autonomic systems, resulting in permanent pain, numbness, and/or weakness of one or several areas of the body; allergic reactions; (i.e.: anaphylactic reaction); and/or death. Furthermore, the patient was informed of those risks and complications associated with the medications. These include, but are not  limited to: allergic reactions (i.e.: anaphylactic or anaphylactoid reaction(s)); adrenal axis suppression; blood sugar elevation that in diabetics may result in ketoacidosis or comma; water retention that in patients with history of congestive heart failure may result in shortness of breath, pulmonary edema, and decompensation with resultant heart failure; weight gain; swelling or edema; medication-induced neural toxicity; particulate matter embolism and blood vessel occlusion with resultant organ, and/or nervous system infarction; and/or aseptic necrosis of one or more joints. Finally, the patient was informed that Medicine is not an exact science; therefore, there is also the possibility of unforeseen or unpredictable risks and/or possible complications that may result in a catastrophic outcome. The patient indicated having understood very clearly. We have given the patient no guarantees and we have made no promises. Enough time was given to the patient to ask questions, all of which were answered to the patient's satisfaction. Ms. Pink has indicated that she wanted to continue with the procedure. Attestation: I, the ordering provider, attest that I have discussed with the patient the benefits, risks, side-effects, alternatives, likelihood of achieving goals, and potential problems during recovery for the procedure that I have provided informed consent. Date   Time: 09/05/2021  8:10 AM  Pre-Procedure Preparation:  Monitoring: As per clinic protocol. Respiration, ETCO2, SpO2, BP, heart rate and rhythm monitor placed and checked for adequate function Safety Precautions: Patient was assessed for positional comfort and pressure points before starting the procedure. Time-out: I initiated and conducted the "Time-out" before starting the procedure, as per protocol. The patient was asked to participate by confirming the accuracy of the "Time Out" information. Verification of the correct person, site, and procedure  were performed and confirmed by me, the nursing staff, and the patient. "Time-out" conducted as per Joint Commission's Universal Protocol (UP.01.01.01). Time: 0955  Description of Procedure:          Laterality: Right Levels:  L2, L3, L4, L5, & S1 Medial Branch Level(s)  Safety Precautions: Aspiration looking for blood return was conducted prior to all injections. At no point did we inject  any substances, as a needle was being advanced. Before injecting, the patient was told to immediately notify me if she was experiencing any new onset of "ringing in the ears, or metallic taste in the mouth". No attempts were made at seeking any paresthesias. Safe injection practices and needle disposal techniques used. Medications properly checked for expiration dates. SDV (single dose vial) medications used. After the completion of the procedure, all disposable equipment used was discarded in the proper designated medical waste containers. Local Anesthesia: Protocol guidelines were followed. The patient was positioned over the fluoroscopy table. The area was prepped in the usual manner. The time-out was completed. The target area was identified using fluoroscopy. A 12-in long, straight, sterile hemostat was used with fluoroscopic guidance to locate the targets for each level blocked. Once located, the skin was marked with an approved surgical skin marker. Once all sites were marked, the skin (epidermis, dermis, and hypodermis), as well as deeper tissues (fat, connective tissue and muscle) were infiltrated with a small amount of a short-acting local anesthetic, loaded on a 10cc syringe with a 25G, 1.5-in  Needle. An appropriate amount of time was allowed for local anesthetics to take effect before proceeding to the next step. Local Anesthetic: Lidocaine 2.0% The unused portion of the local anesthetic was discarded in the proper designated containers. Technical explanation of process:  L2 Medial Branch Nerve Block (MBB):  The target area for the L2 medial branch is at the junction of the postero-lateral aspect of the superior articular process and the superior, posterior, and medial edge of the transverse process of L3. Under fluoroscopic guidance, a Quincke needle was inserted until contact was made with os over the superior postero-lateral aspect of the pedicular shadow (target area). After negative aspiration for blood, 0.5 mL of the nerve block solution was injected without difficulty or complication. The needle was removed intact. L3 Medial Branch Nerve Block (MBB): The target area for the L3 medial branch is at the junction of the postero-lateral aspect of the superior articular process and the superior, posterior, and medial edge of the transverse process of L4. Under fluoroscopic guidance, a Quincke needle was inserted until contact was made with os over the superior postero-lateral aspect of the pedicular shadow (target area). After negative aspiration for blood, 0.5 mL of the nerve block solution was injected without difficulty or complication. The needle was removed intact. L4 Medial Branch Nerve Block (MBB): The target area for the L4 medial branch is at the junction of the postero-lateral aspect of the superior articular process and the superior, posterior, and medial edge of the transverse process of L5. Under fluoroscopic guidance, a Quincke needle was inserted until contact was made with os over the superior postero-lateral aspect of the pedicular shadow (target area). After negative aspiration for blood, 0.5 mL of the nerve block solution was injected without difficulty or complication. The needle was removed intact. L5 Medial Branch Nerve Block (MBB): The target area for the L5 medial branch is at the junction of the postero-lateral aspect of the superior articular process and the superior, posterior, and medial edge of the sacral ala. Under fluoroscopic guidance, a Quincke needle was inserted until contact was  made with os over the superior postero-lateral aspect of the pedicular shadow (target area). After negative aspiration for blood, 0.5 mL of the nerve block solution was injected without difficulty or complication. The needle was removed intact. S1 Medial Branch Nerve Block (MBB): The target area for the S1 medial branch is at  the posterior and inferior 6 o'clock position of the L5-S1 facet joint. Under fluoroscopic guidance, the Quincke needle inserted for the L5 MBB was redirected until contact was made with os over the inferior and postero aspect of the sacrum, at the 6 o' clock position under the L5-S1 facet joint (Target area). After negative aspiration for blood, 0.5 mL of the nerve block solution was injected without difficulty or complication. The needle was removed intact.  Once the entire procedure was completed, the treated area was cleaned, making sure to leave some of the prepping solution back to take advantage of its long term bactericidal properties.      Illustration of the posterior view of the lumbar spine and the posterior neural structures. Laminae of L2 through S1 are labeled. DPRL5, dorsal primary ramus of L5; DPRS1, dorsal primary ramus of S1; DPR3, dorsal primary ramus of L3; FJ, facet (zygapophyseal) joint L3-L4; I, inferior articular process of L4; LB1, lateral branch of dorsal primary ramus of L1; IAB, inferior articular branches from L3 medial branch (supplies L4-L5 facet joint); IBP, intermediate branch plexus; MB3, medial branch of dorsal primary ramus of L3; NR3, third lumbar nerve root; S, superior articular process of L5; SAB, superior articular branches from L4 (supplies L4-5 facet joint also); TP3, transverse process of L3.  Vitals:   09/05/21 0956 09/05/21 1001 09/05/21 1004 09/05/21 1011  BP: (!) 134/102 (!) 140/100 (!) 155/130 (!) 133/99  Pulse:      Resp: 14 16 10    Temp:      TempSrc:      SpO2: 95% 96% 97% 99%  Weight:      Height:         Start Time:  0955 hrs. End Time: 1002 hrs.  Imaging Guidance (Spinal):          Type of Imaging Technique: Fluoroscopy Guidance (Spinal) Indication(s): Assistance in needle guidance and placement for procedures requiring needle placement in or near specific anatomical locations not easily accessible without such assistance. Exposure Time: Please see nurses notes. Contrast: None used. Fluoroscopic Guidance: I was personally present during the use of fluoroscopy. "Tunnel Vision Technique" used to obtain the best possible view of the target area. Parallax error corrected before commencing the procedure. "Direction-depth-direction" technique used to introduce the needle under continuous pulsed fluoroscopy. Once target was reached, antero-posterior, oblique, and lateral fluoroscopic projection used confirm needle placement in all planes. Images permanently stored in EMR. Interpretation: No contrast injected. I personally interpreted the imaging intraoperatively. Adequate needle placement confirmed in multiple planes. Permanent images saved into the patient's record.  Antibiotic Prophylaxis:   Anti-infectives (From admission, onward)    None      Indication(s): None identified  Post-operative Assessment:  Post-procedure Vital Signs:  Pulse/HCG Rate: (!) 5860 Temp: 97.8 F (36.6 C) Resp: 10 BP: (!) 133/99 SpO2: 99 %  EBL: None  Complications: No immediate post-treatment complications observed by team, or reported by patient.  Note: The patient tolerated the entire procedure well. A repeat set of vitals were taken after the procedure and the patient was kept under observation following institutional policy, for this type of procedure. Post-procedural neurological assessment was performed, showing return to baseline, prior to discharge. The patient was provided with post-procedure discharge instructions, including a section on how to identify potential problems. Should any problems arise concerning this  procedure, the patient was given instructions to immediately contact us, at any time, without hesitation. In any case, we plan to contact the patient by telephone  for a follow-up status report regarding this interventional procedure.  Comments:  No additional relevant information.  Plan of Care  Orders:  Orders Placed This Encounter  Procedures   LUMBAR FACET(MEDIAL BRANCH NERVE BLOCK) MBNB    Scheduling Instructions:     Procedure: Lumbar facet block (AKA.: Lumbosacral medial branch nerve block)     Side: Right-sided     Level: L3-4, L4-5, & L5-S1 Facets (L2, L3, L4, L5, & S1 Medial Branch Nerves)     Sedation: Patient's choice.     Timeframe: Today    Order Specific Question:   Where will this procedure be performed?    Answer:   [facility]   DG PAIN [facility] C-ARM 1-60 MIN NO REPORT    Intraoperative interpretation by procedural physician at [place].    Standing Status:   Standing    Number of Occurrences:   1    Order Specific Question:   Reason for exam:    Answer:   Assistance in needle guidance and placement for procedures requiring needle placement in or near specific anatomical locations not easily accessible without such assistance.   Informed Consent Details: Physician/Practitioner Attestation; Transcribe to consent form and obtain patient signature    Nursing Order: Transcribe to consent form and obtain patient signature. Note: Always confirm laterality of pain with Ms. Deniston, before procedure.    Order Specific Question:   Physician/Practitioner attestation of informed consent for procedure/surgical case    Answer:   I, the physician/practitioner, attest that I have discussed with the patient the benefits, risks, side effects, alternatives, likelihood of achieving goals and potential problems during recovery for the procedure that I have provided informed consent.    Order Specific Question:   Procedure    Answer:   Lumbar Facet Block  under  fluoroscopic guidance    Order Specific Question:   Physician/Practitioner performing the procedure    Answer:   Dickie Cloe A. Dossie Arbour MD    Order Specific Question:   Indication/Reason    Answer:   Low Back Pain, with our without leg pain, due to Facet Joint Arthralgia (Joint Pain) Spondylosis (Arthritis of the Spine), without myelopathy or radiculopathy (Nerve Damage).   Care order/instruction: Please confirm that the patient has stopped the Xarelto (Rivaroxaban) x 3 days prior to procedure or surgery.    Please confirm that the patient has stopped the Xarelto (Rivaroxaban) x 3 days prior to procedure or surgery.    Standing Status:   Standing    Number of Occurrences:   1   Provide equipment / supplies at bedside    "Block Tray" (Disposable   single use) Needle type: SpinalSpinal Amount/quantity: 4 Size: Medium (5-inch) Gauge: 22G    Standing Status:   Standing    Number of Occurrences:   1    Order Specific Question:   Specify    Answer:   Block Tray   Bleeding precautions    Standing Status:   Standing    Number of Occurrences:   1   Chronic Opioid Analgesic:  No opioid analgesics prescribed by our practice.  The patient received a letter from [facility] indicating that they would not be paying for her oxycodone prescriptions.   Medications ordered for procedure: Meds ordered this encounter  Medications   lidocaine (XYLOCAINE) 2 % (with pres) injection 400 mg   pentafluoroprop-tetrafluoroeth (GEBAUERS) aerosol   lactated ringers infusion 1,000 mL   midazolam (VERSED) 5 MG/5ML injection 0.5-2 mg  Make sure Flumazenil is available in the pyxis when using this medication. If oversedation occurs, administer 0.2 mg IV over 15 sec. If after 45 sec no response, administer 0.2 mg again over 1 min; may repeat at 1 min intervals; not to exceed 4 doses (1 mg)   ropivacaine (PF) 2 mg/mL (0.2%) (NAROPIN) injection 9 mL   triamcinolone acetonide (KENALOG-40) injection 40 mg   Medications  administered: We administered lidocaine, lactated ringers, midazolam, ropivacaine (PF) 2 mg/mL (0.2%), and triamcinolone acetonide.  See the medical record for exact dosing, route, and time of administration.  Follow-up plan:   Return in about 2 weeks (around 09/19/2021) for Proc-day (T,Th), (VV), (PPE).       Interventional Therapies  Risk   Complexity Considerations:   Estimated body mass index is 31.83 kg/m as calculated from the following:   Height as of 05/28/21: 5\' 2"  (1.575 m).   Weight as of 05/28/21: 174 lb (78.9 kg).   Anticoagulation: Xarelto. (Stop: 3 days   Restart: 6 hrs)  Allergy: Iodine and radiological contrast  Implant: Internal cardiac defibrillator  History of noncompliance with post procedure follow-ups. (Prior patient of Dr. Mohammed Kindle) NOT a candidate for Lumbar RFA 2ry to Fusion Hardware.   Planned   Pending:   Palliative right lumbar facet MBB #1 (09/05/2021)    Under consideration:   Initially sent by Dr. Cari Caraway for intrathecal pump trial.  On initial evaluation she was not sure about it and postponed it.  Around 06/11/2021 she thought that she was ready for it and asked [place] to proceed however, on 09/02/2021 she notified us that she definitely does not want to proceed with the pump.    Completed:   Lumbar spine surgery: ALIF L4-5, PSF L4-S1 (Dr. Elayne Guerin) (09/29/2019)  Therapeutic/palliative bilateral lumbar facet MBB x7 (05/28/2021) (100/100/100/100 x several days/0)  Therapeutic bilateral L5 TFESI x2 (05/31/2019) (80/80/80/>75)  Therapeutic right lumbar facet RFA x1 (02/07/2020) (DO NOT REPEAT - FUSION hardware)   Completed by Dr. Primus Bravo:   Diagnostic right lumbar facet MBB x1 (03/12/2015) (n/a)  Diagnostic/therapeutic right SI joint nerves block x3 (02/05/2015) (n/a)    Therapeutic   Palliative (PRN) options:   Palliative lumbar facet MBB (for low back pain)  Palliative L5 TFESI (for radicular lower extremity pain)     Recent Visits Date Type  Provider Dept  09/02/21 Office Visit Milinda Pointer, MD [facility]  06/11/21 Office Visit Milinda Pointer, MD [facility]  Showing recent visits within past 90 days and meeting all other requirements Today's Visits Date Type Provider Dept  09/05/21 Procedure visit Milinda Pointer, MD [facility]  Showing today's visits and meeting all other requirements Future Appointments Date Type Provider Dept  09/19/21 Appointment Milinda Pointer, MD [facility]  Showing future appointments within next 90 days and meeting all other requirements  Disposition: Discharge home  Discharge (Date   Time): 09/05/2021; 1018 hrs.   Primary Care Physician: [name], MD Location: [facility] Note by: [name], MD Date: 09/05/2021; Time: 12:33 PM  Disclaimer:  Medicine is not an Chief Strategy Officer. The only guarantee in medicine is that nothing is guaranteed. It is important to note that the decision to proceed with this intervention was based on the information collected from the patient. The Data and conclusions were drawn from the patient's questionnaire, the interview, and the physical examination. Because the information was provided in large part by the patient, it cannot be  guaranteed that it has not been purposely or unconsciously manipulated. Every effort has been made to obtain as much relevant data as possible for this evaluation. It is important to note that the conclusions that lead to this procedure are derived in large part from the available data. Always take into account that the treatment will also be dependent on availability of resources and existing treatment guidelines, considered by other Pain Management Practitioners as being common knowledge and practice, at the time of the intervention. For Medico-Legal purposes, it is also important to point out that variation in procedural techniques and  pharmacological choices are the acceptable norm. The indications, contraindications, technique, and results of the above procedure should only be interpreted and judged by a Board-Certified Interventional Pain Specialist with extensive familiarity and expertise in the same exact procedure and technique.

## 2021-09-04 NOTE — Patient Instructions (Signed)

## 2021-09-05 ENCOUNTER — Encounter: Payer: Self-pay | Admitting: Pain Medicine

## 2021-09-05 ENCOUNTER — Other Ambulatory Visit: Payer: Self-pay

## 2021-09-05 ENCOUNTER — Ambulatory Visit
Admission: RE | Admit: 2021-09-05 | Discharge: 2021-09-05 | Disposition: A | Discharge status: Home / Self Care | Payer: Medicaid Other | Source: Ambulatory Visit | Attending: Pain Medicine | Admitting: Pain Medicine

## 2021-09-05 ENCOUNTER — Ambulatory Visit (HOSPITAL_BASED_OUTPATIENT_CLINIC_OR_DEPARTMENT_OTHER): Payer: Medicaid Other | Admitting: Pain Medicine

## 2021-09-05 VITALS — BP 133/99 | HR 58 | Temp 97.8°F | Resp 10 | Ht 62.0 in | Wt 168.0 lb

## 2021-09-05 DIAGNOSIS — M5136 Other intervertebral disc degeneration, lumbar region: Secondary | ICD-10-CM | POA: Insufficient documentation

## 2021-09-05 DIAGNOSIS — Z993 Dependence on wheelchair: Secondary | ICD-10-CM

## 2021-09-05 DIAGNOSIS — M4310 Spondylolisthesis, site unspecified: Secondary | ICD-10-CM | POA: Diagnosis present

## 2021-09-05 DIAGNOSIS — M47816 Spondylosis without myelopathy or radiculopathy, lumbar region: Secondary | ICD-10-CM | POA: Diagnosis present

## 2021-09-05 DIAGNOSIS — M545 Low back pain, unspecified: Secondary | ICD-10-CM | POA: Diagnosis present

## 2021-09-05 DIAGNOSIS — G8929 Other chronic pain: Secondary | ICD-10-CM | POA: Insufficient documentation

## 2021-09-05 DIAGNOSIS — Z7901 Long term (current) use of anticoagulants: Secondary | ICD-10-CM

## 2021-09-05 DIAGNOSIS — M4327 Fusion of spine, lumbosacral region: Secondary | ICD-10-CM

## 2021-09-05 MED ORDER — LIDOCAINE HCL 2 % IJ SOLN
20.0000 mL | Freq: Once | INTRAMUSCULAR | Status: AC
  Administered 2021-09-05: 400 mg
  Filled 2021-09-05: qty 20

## 2021-09-05 MED ORDER — LACTATED RINGERS IV SOLN
1000.0000 mL | Freq: Once | INTRAVENOUS | Status: AC
  Administered 2021-09-05: 1000 mL via INTRAVENOUS

## 2021-09-05 MED ORDER — MIDAZOLAM HCL 5 MG/5ML IJ SOLN
0.5000 mg | Freq: Once | INTRAMUSCULAR | Status: AC
  Administered 2021-09-05: 1 mg via INTRAVENOUS
  Filled 2021-09-05: qty 5

## 2021-09-05 MED ORDER — PENTAFLUOROPROP-TETRAFLUOROETH EX AERO
INHALATION_SPRAY | Freq: Once | CUTANEOUS | Status: DC
  Filled 2021-09-05: qty 116

## 2021-09-05 MED ORDER — ROPIVACAINE HCL 2 MG/ML IJ SOLN
INTRAMUSCULAR | Status: AC
  Filled 2021-09-05: qty 20

## 2021-09-05 MED ORDER — TRIAMCINOLONE ACETONIDE 40 MG/ML IJ SUSP
40.0000 mg | Freq: Once | INTRAMUSCULAR | Status: AC
  Administered 2021-09-05: 40 mg
  Filled 2021-09-05: qty 1

## 2021-09-05 MED ORDER — ROPIVACAINE HCL 2 MG/ML IJ SOLN
9.0000 mL | Freq: Once | INTRAMUSCULAR | Status: AC
  Administered 2021-09-05: 9 mL via PERINEURAL

## 2021-09-05 NOTE — Progress Notes (Signed)
Safety precautions to be maintained throughout the outpatient stay will include: orient to surroundings, keep bed in low position, maintain call bell within reach at all times, provide assistance with transfer out of bed and ambulation.  

## 2021-09-06 ENCOUNTER — Telehealth: Payer: Self-pay

## 2021-09-06 NOTE — Telephone Encounter (Signed)
Post procedure phone call.  Patient states she is doing fine.  

## 2021-09-12 ENCOUNTER — Ambulatory Visit: Payer: Medicaid Other | Admitting: Pain Medicine

## 2021-09-18 NOTE — Progress Notes (Signed)
Patient: CORNELL Mcdonald  Service Category: E/M  Provider: [name], MD  DOB: Nov 21, 1965  DOS: 09/19/2021  Location: Office  MRN: [id number]  Setting: Ambulatory outpatient  Referring Provider: [name], MD  Type: Established Patient  Specialty: Interventional Pain Management  PCP: [name], MD  Location: Remote location  Delivery: TeleHealth     Virtual Encounter - Pain Management PROVIDER NOTE: Information contained herein reflects review and annotations entered in association with encounter. Interpretation of such information and data should be left to medically-trained personnel. Information provided to patient can be located elsewhere in the medical record under "Patient Instructions". Document created using STT-dictation technology, any transcriptional errors that may result from process are unintentional.    Contact & Pharmacy Preferred: [phone] Home: [phone] (home) Mobile: [phone] (mobile) E-mail: [name]  [facility] [place] - [place] [address] Phone: [phone] Fax: [phone]  [facility] - [place], [place] - [address] [place] [address] Phone: [phone] Fax: [phone]   Pre-screening  [name] offered "in-person" vs "virtual" encounter. She indicated preferring virtual for this encounter.   Reason COVID-19*   Social distancing based on CDC and AMA recommendations.   I contacted COBY ANTROBUS on 09/19/2021 via telephone.      I clearly identified myself as [name], MD. I verified that I was speaking with the correct person using two identifiers (Name: [name], and date of birth: 10-Sep-1965).  Consent I sought verbal advanced consent from [name] for virtual visit interactions. I informed Ms. [name] of possible security and privacy concerns, risks, and limitations associated with providing "not-in-person" medical  evaluation and management services. I also informed Ms. [name] of the availability of "in-person" appointments. Finally, I informed her that there would be a charge for the virtual visit and that she could be  personally, fully or partially, financially responsible for it. Ms. Bahe expressed understanding and agreed to proceed.   Historic Elements   [name] is a 56 y.o. year old, female patient evaluated today after our last contact on 09/05/2021. [name]  has a past medical history of Allergy, Anxiety, Asthma (05/06/2016), Coronary artery disease, DDD (degenerative disc disease), cervical, Depression, Diverticulosis, DVT (deep venous thrombosis) ([place]), GERD (gastroesophageal reflux disease), Heart failure (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 She also  has a past surgical history that includes Tubal ligation (1998); Ovarian cyst removal (Left); central line (03/25/2015); Hand surgery (Left); Cardiac catheterization (N/A, 03/27/2015); Cardiac catheterization (N/A, 04/11/2015); Cardiac catheterization (N/A, 07/16/2015); Colonoscopy with propofol (N/A, 10/01/2015); Esophagogastroduodenoscopy (egd) with propofol (N/A, 10/01/2015); Abdominal hysterectomy; and Hip Arthroplasty (Right, 02/27/2020). [name] has a current medication list which includes the following prescription(s): acetaminophen, albuterol, atorvastatin, azelastine, gnp calcium 1200, cetirizine, vitamin d3, dicyclomine, fluticasone, furosemide, gabapentin, hydroxyzine, magnesium, metoprolol tartrate, polyethylene glycol, promethazine, rivaroxaban, senna-docusate, sertraline, topiramate, triamcinolone, and famotidine. She  reports that she has never smoked. She has never used smokeless tobacco. She reports that she does not drink alcohol and does not use drugs. [name] is allergic to tape, iodinated contrast media,  toradol [ketorolac tromethamine], tramadol, and zofran [ondansetron hcl].   HPI  Today, she is being contacted for a post-procedure assessment.  According to the patient she attained 100% relief of the pain for the duration of the local anesthetic which then  went down to an ongoing 50% relief of her right-sided low back pain.  This again confirms that the injected area is responsible for the pain that she was experiencing and that some of the pain was secondary to an inflammatory process.  Unfortunately the patient has had lumbar spine surgery with fusion and she has a considerable amount of hardware in the area and therefore she is not a candidate for radiofrequency ablation.  If she continues to have pain in the lower back an alternative would be to try a caudal epidural steroid injection with the aim of perhaps doing a Racz procedure in the future.  In addition to that, other alternatives would include implants such as spinal cord stimulator or intrathecal pump however she has already stated that she does not want to have any further surgeries and that includes implants.  For the time being, she refers that she is doing okay and therefore does not need anything else.  The patient was encouraged to give [place] a call if our services are again needed.  She understood and accepted.  Post-procedure evaluation   Type: Lumbar Facet, Medial Branch Block(s) #1 (overall this would have been #8, since I first started taking care of her on 12/16/2017) Laterality: Right Level: L2, L3, L4, L5, & S1 Medial Branch Level(s). Injecting these levels blocks the L3-4, L4-5, and L5-S1 lumbar facet joints. Imaging: Fluoroscopic guidance Anesthesia: Local anesthesia (1-2% Lidocaine) Anxiolysis: IV (IV Versed) Sedation: None.  DOS: 09/05/2021 Performed by: [name], MD  Primary Purpose: Palliative Indications: Low back pain severe enough to impact quality of life or function. 1. Lumbar facet syndrome (Bilateral)  (R>L)   2. DDD (degenerative disc disease), lumbar   3. Lumbar facet arthropathy   4. Grade 2-3 (13-16 mm) Anterolisthesis of L5 over S1 (06/22/2019)   5. Chronic low back pain (Bilateral) w/o sciatica   6. Fusion of lumbosacral spine   7. Wheelchair dependent   8. Chronic anticoagulation (Xarelto)    NAS-11 Pain score:   Pre-procedure: 9 /10   Post-procedure: 0-No pain/10     Effectiveness:  Initial hour after procedure: 100 %. Subsequent 4-6 hours post-procedure: 100 %. Analgesia past initial 6 hours: 50 % (ongoing). Ongoing improvement:  Analgesic: The patient indicates having an ongoing 50% relief of her right sided low back pain. Function: Ms. Dizdarevic reports improvement in function ROM: Ms. Parkison reports improvement in ROM  Pharmacotherapy Assessment   Opioid Analgesic: No opioid analgesics prescribed by our practice.  The patient received a letter from [facility] indicating that they would not be paying for her oxycodone prescriptions.   Monitoring: [place] PMP: PDMP not reviewed this encounter.       Pharmacotherapy: No side-effects or adverse reactions reported. Compliance: No problems identified. Effectiveness: Clinically acceptable. Plan: Refer to "POC". UDS: No results found for: SUMMARY   Laboratory Chemistry Profile   Renal Lab Results  Component Value Date   BUN 15 03/01/2020   CREATININE 0.68 03/01/2020   BCR 21 04/16/2015   GFRAA >60 03/01/2020   GFRNONAA >60 03/01/2020    Hepatic Lab Results  Component Value Date   AST 28 02/25/2020   ALT 29 02/25/2020   ALBUMIN 3.9 02/25/2020   ALKPHOS 125 02/25/2020   LIPASE 29 03/27/2019    Electrolytes Lab Results  Component Value Date   NA 140 03/01/2020   K 3.7 03/01/2020   CL 107 03/01/2020   CALCIUM 8.2 (L) 03/01/2020   MG 2.3 02/27/2020   PHOS  3.8 02/19/2017    Bone Lab Results  Component Value Date   VD25OH 19.3 (L) 11/30/2017    Inflammation (CRP: Acute Phase) (ESR: Chronic Phase) Lab Results   Component Value Date   CRP <0.8 11/30/2017   ESRSEDRATE 19 11/30/2017   LATICACIDVEN 0.8 03/26/2015         Note: Above Lab results reviewed.  Imaging  DG PAIN CLINIC C-ARM 1-60 MIN NO REPORT Fluoro was used, but no Radiologist interpretation will be provided.  Please refer to "NOTES" tab for provider progress note.  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 Diagnoses of Lumbar facet syndrome (Bilateral) (R>L), Grade 2-3 (13-16 mm) Anterolisthesis of L5 over S1 (06/22/2019), Lumbar facet arthropathy, DDD (degenerative disc disease), lumbar, Wheelchair dependent, and Chronic pain syndrome were also pertinent to this visit.  Plan of Care  Problem-specific:  No problem-specific Assessment & Plan notes found for this encounter.  Ms. SLOKA VOLANTE has a current medication list which includes the following long-term medication(s): albuterol, atorvastatin, azelastine, gnp calcium 1200, cetirizine, fluticasone, furosemide, metoprolol tartrate, rivaroxaban, topiramate, and famotidine.  Pharmacotherapy (Medications Ordered): No orders of the defined types were placed in this encounter.  Orders:  No orders of the defined types were placed in this encounter.  Follow-up plan:   No follow-ups on file.     Interventional Therapies  Risk   Complexity Considerations:   Estimated body mass index is 31.83 kg/m as calculated from the following:   Height as of 05/28/21: _0  (1.575 m).   Weight as of 05/28/21: 174 lb (78.9 kg).   Anticoagulation: Xarelto. (Stop: 3 days   Restart: 6 hrs)  Allergy: Iodine and radiological contrast  Implant: Internal cardiac defibrillator  History of noncompliance with post procedure follow-ups. (Prior patient of Dr. Mohammed Kindle) NOT a candidate for Lumbar RFA 2ry to Fusion Hardware.   Planned   Pending:   Palliative right lumbar facet MBB #1 (09/05/2021)    Under consideration:   Initially sent by Dr.  Cari Caraway for intrathecal pump trial.  On initial evaluation she was not sure about it and postponed it.  Around 06/11/2021 she thought that she was ready for it and asked [place] to proceed however, on 09/02/2021 she notified us that she definitely does not want to proceed with the pump.    Completed:   Lumbar spine surgery: ALIF L4-5, PSF L4-S1 (Dr. Elayne Guerin) (09/29/2019)  Therapeutic/palliative bilateral lumbar facet MBB x7 (05/28/2021) (100/100/100/100 x several days/0)  Therapeutic bilateral L5 TFESI x2 (05/31/2019) (80/80/80/>75)  Therapeutic right lumbar facet RFA x1 (02/07/2020) (DO NOT REPEAT - FUSION hardware)   Completed by Dr. Primus Bravo:   Diagnostic right lumbar facet MBB x1 (03/12/2015) (n/a)  Diagnostic/therapeutic right SI joint NB x3 (02/05/2015) (n/a)    Therapeutic   Palliative (PRN) options:   Palliative lumbar facet MBB (for low back pain)  Palliative L5 TFESI (for radicular lower extremity pain)      Recent Visits Date Type Provider Dept  09/05/21 Procedure visit Milinda Pointer, MD [facility]  09/02/21 Office Visit Milinda Pointer, MD [facility]  Showing recent visits within past 90 days and meeting all other requirements Today's Visits Date Type Provider Dept  09/19/21 Office Visit Milinda Pointer, MD [facility]  Showing today's visits and meeting all other requirements Future Appointments No visits were found meeting these conditions. Showing future appointments within next 90 days and meeting all other requirements  I discussed  the assessment and treatment plan with the patient. The patient was provided an opportunity to ask questions and all were answered. The patient agreed with the plan and demonstrated an understanding of the instructions.  Patient advised to call back or seek an in-person evaluation if the symptoms or condition worsens.  Duration of encounter: 15 minutes.  Note by: [name], MD Date:  09/19/2021; Time: 3:21 PM

## 2021-09-19 ENCOUNTER — Other Ambulatory Visit: Payer: Self-pay

## 2021-09-19 ENCOUNTER — Ambulatory Visit: Payer: Medicaid Other | Attending: Pain Medicine | Admitting: Pain Medicine

## 2021-09-19 DIAGNOSIS — G8929 Other chronic pain: Secondary | ICD-10-CM

## 2021-09-19 DIAGNOSIS — M47816 Spondylosis without myelopathy or radiculopathy, lumbar region: Secondary | ICD-10-CM | POA: Diagnosis not present

## 2021-09-19 DIAGNOSIS — M545 Low back pain, unspecified: Secondary | ICD-10-CM

## 2021-09-19 DIAGNOSIS — Z993 Dependence on wheelchair: Secondary | ICD-10-CM

## 2021-09-19 DIAGNOSIS — M5136 Other intervertebral disc degeneration, lumbar region: Secondary | ICD-10-CM

## 2021-09-19 DIAGNOSIS — M4310 Spondylolisthesis, site unspecified: Secondary | ICD-10-CM

## 2021-09-19 DIAGNOSIS — G894 Chronic pain syndrome: Secondary | ICD-10-CM

## 2021-11-18 ENCOUNTER — Telehealth: Payer: Self-pay | Admitting: Pain Medicine

## 2021-11-18 NOTE — Telephone Encounter (Signed)
Patient called asking for a referral to [facility]. Please ask Dr Dossie Arbour to put in so I can send for her ?

## 2021-11-18 NOTE — Telephone Encounter (Signed)
Called patient back to let her know that if she needs a new referral she will need to contact her PCP for referral.  Patient states she just wanted to check out this new office and provider.  She did ask that if she did go and see the new provider, would she be able to come back to our clinic.  I told her that as long as she doesn't enter into a contract that would be fine.  ?

## 2021-11-22 DIAGNOSIS — I69369 Other paralytic syndrome following cerebral infarction affecting unspecified side: Secondary | ICD-10-CM | POA: Insufficient documentation

## 2021-11-22 DIAGNOSIS — K4091 Unilateral inguinal hernia, without obstruction or gangrene, recurrent: Secondary | ICD-10-CM | POA: Insufficient documentation

## 2021-11-25 NOTE — Progress Notes (Signed)
Patient: Claire Mcdonald  Service Category: E/M  Provider: [name], MD  ?DOB: [birth year]  DOS: 11/27/2021  Location: Office  ?MRN: [id number]  Setting: Ambulatory outpatient  Referring Provider: [name], MD  ?Type: Established Patient  Specialty: Interventional Pain Management  PCP: [name], MD  ?Location: Remote location  Delivery: TeleHealth    ? ?Virtual Encounter - Pain Management ?PROVIDER NOTE: Information contained herein reflects review and annotations entered in association with encounter. Interpretation of such information and data should be left to medically-trained personnel. Information provided to patient can be located elsewhere in the medical record under "Patient Instructions". Document created using STT-dictation technology, any transcriptional errors that may result from process are unintentional.  ?  ?Contact & Pharmacy ?Preferred: [phone] ?Home: [phone] (home) ?Mobile: [phone] (mobile) ?E-mail: [name]  ?[facility], [address] ?[place] ?[place] [address] ?Phone: [phone] Fax: [phone] ? ?[facility] - [place], [place] - [id number] ?[address] ?[place] [address] ?Phone: [phone] Fax: [phone] ?  ?Pre-screening  ?Claire Mcdonald offered "in-person" vs "virtual" encounter. She indicated preferring virtual for this encounter.  ? ?Reason ?COVID-19*  Social distancing based on CDC and AMA recommendations.  ? ?I contacted Claire Mcdonald on 11/27/2021 via telephone.      I clearly identified myself as [name], MD. I verified that I was speaking with the correct person using two identifiers (Name: Claire Mcdonald, and date of birth: 07/16/1966). ? ?Consent ?I sought verbal advanced consent from Claire Mcdonald for virtual visit interactions. I informed Claire Mcdonald of possible security and privacy concerns, risks, and limitations associated with providing "not-in-person" medical evaluation  and management services. I also informed Claire Mcdonald of the availability of "in-person" appointments. Finally, I informed her that there would be a charge for the virtual visit and that she could be  personally, fully or partially, financially responsible for it. Claire Mcdonald expressed understanding and agreed to proceed.  ? ?Historic Elements   ?Claire Mcdonald is a 56 y.o. year old, female patient evaluated today after our last contact on 11/18/2021. 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 She also  has a past surgical history that includes Tubal ligation (1998); Ovarian cyst removal (Left); central line (03/25/2015); Hand surgery (Left); Cardiac catheterization (N/A, 03/27/2015); Cardiac catheterization (N/A, 04/11/2015); Cardiac catheterization (N/A, 07/16/2015); Colonoscopy with propofol (N/A, 10/01/2015); Esophagogastroduodenoscopy (egd) with propofol (N/A, 10/01/2015); Abdominal hysterectomy; and Hip Arthroplasty (Right, 02/27/2020). Claire Mcdonald has a current medication list which includes the following prescription(s): acetaminophen, albuterol, atorvastatin, azelastine, gnp calcium 1200, cetirizine, vitamin d3, dicyclomine, famotidine, fluticasone, furosemide, gabapentin, hydroxyzine, magnesium, metoprolol tartrate, polyethylene glycol, promethazine, rivaroxaban, senna-docusate, sertraline, topiramate, and triamcinolone. She  reports that she has never smoked. She has never used smokeless tobacco. She reports that she does not drink alcohol and does not use drugs. Claire Mcdonald is allergic to tape, iodinated contrast media, toradol  [ketorolac tromethamine], tramadol, and zofran [ondansetron hcl].  ? ?HPI  ?Today, she is being contacted for worsening of previously known (established) problem.  The patient indicates having increased pain in the lower back.  Is also having some lower extremity pain  going down the right leg, but it does not go past the knee.  The lower back pain is worse than the lower extremity pain.  In the lower back she is having pain that is bilateral with the right being worse than the left.  In the case of the lower extremity its only the left side.  She refers that the pain goes down to the level of the knee.  She wants to repeat the same procedure we did last time.  According to our records on 09/05/2021 we did a right-sided lumbar facet block.  This time she refers that she is having the pain on both sides and wants to have pulselike done.  Because the patient has extensive hardware in the lower back, she is not a candidate for radiofrequency ablation. ? ?Pharmacotherapy Assessment  ? ?Opioid Analgesic: No opioid analgesics prescribed by our practice.  The patient received a letter from [facility] indicating that they would not be paying for her oxycodone prescriptions.  ? ?Monitoring: ?[place] PMP: PDMP reviewed during this encounter.       ?Pharmacotherapy: No side-effects or adverse reactions reported. ?Compliance: No problems identified. ?Effectiveness: Clinically acceptable. ?Plan: Refer to "POC". UDS: No results found for: SUMMARY  ? ?Laboratory Chemistry Profile  ? ?Renal ?Lab Results  ?Component Value Date  ? BUN 15 03/01/2020  ? CREATININE 0.68 03/01/2020  ? BCR 21 04/16/2015  ? GFRAA >60 03/01/2020  ? GFRNONAA >60 03/01/2020  ?  Hepatic ?Lab Results  ?Component Value Date  ? AST 28 02/25/2020  ? ALT 29 02/25/2020  ? ALBUMIN 3.9 02/25/2020  ? ALKPHOS 125 02/25/2020  ? LIPASE 29 03/27/2019  ?  ?Electrolytes ?Lab Results  ?Component Value Date  ? NA 140 03/01/2020  ? K 3.7 03/01/2020  ? CL 107 03/01/2020  ? CALCIUM 8.2  (L) 03/01/2020  ? MG 2.3 02/27/2020  ? PHOS 3.8 02/19/2017  ?  Bone ?Lab Results  ?Component Value Date  ? VD25OH 19.3 (L) 11/30/2017  ?  ?Inflammation (CRP: Acute Phase) (ESR: Chronic Phase) ?Lab Results  ?Component Value Date  ? CRP <0.8 11/30/2017  ? ESRSEDRATE 19 11/30/2017  ? LATICACIDVEN 0.8 03/26/2015  ?    ?  ? ?Note: Above Lab results reviewed. ? ?Imaging  ?[place] C-ARM 1-60 MIN NO REPORT ?Fluoro was used, but no Radiologist interpretation will be provided.  ?Please refer to "NOTES" tab for provider progress note. ? ?Assessment  ?The primary encounter diagnosis was Lumbar facet syndrome (Bilateral) (R>L). Diagnoses of Chronic low back pain (Bilateral) w/o sciatica, Lumbar facet arthropathy, DDD (degenerative disc disease), lumbosacral, Spondylosis without myelopathy or radiculopathy, lumbosacral region, and Chronic anticoagulation (Xarelto) were also pertinent to this visit. ? ?Plan of Care  ?Problem-specific:  ?No problem-specific Assessment & Plan notes found for this encounter. ? ?Ms. FAITHLYNN DEELEY has a current medication list which includes the following long-term medication(s): albuterol, atorvastatin, azelastine, gnp calcium 1200, cetirizine, famotidine, fluticasone, furosemide, metoprolol tartrate, rivaroxaban, and topiramate. ? ?Pharmacotherapy (Medications Ordered): ?No orders of the defined types were placed in this encounter. ? ?Orders:  ?Orders Placed This Encounter  ?Procedures  ? LUMBAR FACET(MEDIAL BRANCH NERVE BLOCK) MBNB  ?  Standing Status:   Future  ?  Standing Expiration Date:   02/26/2022  ?  Scheduling Instructions:  ?   Procedure: Lumbar facet block (AKA.: Lumbosacral medial branch nerve block)  ?   Side: Bilateral  ?   Level: L3-4 & L5-S1 Facets (L2, L3, L4, L5, &  S1 Medial Branch Nerves)  ?   Sedation: Patient's choice.  ?   Timeframe: ASAA  ?  Order Specific Question:   Where will this procedure be performed?  ?  Answer:   [facility]  ? Blood Thinner Instructions to  Nursing  ?  Always make sure patient has clearance from prescribing physician to stop blood thinners for interventional therapies. If the patient requires a Lovenox-bridge therapy, make sure arrangements are mad

## 2021-11-27 ENCOUNTER — Ambulatory Visit: Payer: Medicaid Other | Attending: Pain Medicine | Admitting: Pain Medicine

## 2021-11-27 DIAGNOSIS — M47817 Spondylosis without myelopathy or radiculopathy, lumbosacral region: Secondary | ICD-10-CM

## 2021-11-27 DIAGNOSIS — M5137 Other intervertebral disc degeneration, lumbosacral region: Secondary | ICD-10-CM

## 2021-11-27 DIAGNOSIS — M47816 Spondylosis without myelopathy or radiculopathy, lumbar region: Secondary | ICD-10-CM

## 2021-11-27 DIAGNOSIS — G8929 Other chronic pain: Secondary | ICD-10-CM

## 2021-11-27 DIAGNOSIS — M545 Low back pain, unspecified: Secondary | ICD-10-CM | POA: Diagnosis not present

## 2021-11-27 DIAGNOSIS — Z7901 Long term (current) use of anticoagulants: Secondary | ICD-10-CM

## 2021-11-27 DIAGNOSIS — M51379 Other intervertebral disc degeneration, lumbosacral region without mention of lumbar back pain or lower extremity pain: Secondary | ICD-10-CM

## 2021-11-27 NOTE — Patient Instructions (Signed)
______________________________________________________________________ ? ?Preparing for Procedure with Sedation ? ?NOTICE: Due to recent regulatory changes, starting on March 25, 2021, procedures requiring intravenous (IV) sedation will no longer be performed at the [facility].  These types of procedures are required to be performed at [facility] ambulatory surgery facility.  We are very sorry for the inconvenience. ? ?Procedure appointments are limited to planned procedures: ?No Prescription Refills. ?No disability issues will be discussed. ?No medication changes will be discussed. ? ?Instructions: ?Oral Intake: Do not eat or drink anything for at least 8 hours prior to your procedure. (Exception: Blood Pressure Medication. See below.) ?Transportation: A driver is required. You may not drive yourself after the procedure. ?Blood Pressure Medicine: Do not forget to take your blood pressure medicine with a sip of water the morning of the procedure. If your Diastolic (lower reading) is above 100 mmHg, elective cases will be cancelled/rescheduled. ?Blood thinners: These will need to be stopped for procedures. Notify our staff if you are taking any blood thinners. Depending on which one you take, there will be specific instructions on how and when to stop it. ?Diabetics on insulin: Notify the staff so that you can be scheduled 1st case in the morning. If your diabetes requires high dose insulin, take only ? of your normal insulin dose the morning of the procedure and notify the staff that you have done so. ?Preventing infections: Shower with an antibacterial soap the morning of your procedure. ?Build-up your immune system: Take 1000 mg of Vitamin C with every meal (3 times a day) the day prior to your procedure. ?Antibiotics: Inform the staff if you have a condition or reason that requires you to take antibiotics before dental procedures. ?Pregnancy: If you are pregnant, call and cancel the procedure. ?Sickness: If  you have a cold, fever, or any active infections, call and cancel the procedure. ?Arrival: You must be in the facility at least 30 minutes prior to your scheduled procedure. ?Children: Do not bring children with you. ?Dress appropriately: Bring dark clothing that you would not mind if they get stained. ?Valuables: Do not bring any jewelry or valuables. ? ?Reasons to call and reschedule or cancel your procedure: (Following these recommendations will minimize the risk of a serious complication.) ?Surgeries: Avoid having procedures within 2 weeks of any surgery. (Avoid for 2 weeks before or after any surgery). ?Flu Shots: Avoid having procedures within 2 weeks of a flu shots. (Avoid for 2 weeks before or after immunizations). ?Barium: Avoid having a procedure within 7-10 days after having had a radiological study involving the use of radiological contrast. (Myelograms, Barium swallow or enema study). ?Heart attacks: Avoid any elective procedures or surgeries for the initial 6 months after a "Myocardial Infarction" (Heart Attack). ?Blood thinners: It is imperative that you stop these medications before procedures. Let us know if you if you take any blood thinner.  ?Infection: Avoid procedures during or within two weeks of an infection (including chest colds or gastrointestinal problems). Symptoms associated with infections include: Localized redness, fever, chills, night sweats or profuse sweating, burning sensation when voiding, cough, congestion, stuffiness, runny nose, sore throat, diarrhea, nausea, vomiting, cold or Flu symptoms, recent or current infections. It is specially important if the infection is over the area that we intend to treat. ?Heart and lung problems: Symptoms that may suggest an active cardiopulmonary problem include: cough, chest pain, breathing difficulties or shortness of breath, dizziness, ankle swelling, uncontrolled high or unusually low blood pressure, and/or palpitations. If you are    experiencing any of these symptoms, cancel your procedure and contact your primary care physician for an evaluation. ? ?Remember:  ?Regular Business hours are:  ?Monday to Thursday 8:00 AM to 4:00 PM ? ?Provider's Schedule: ?Ninnie Fein, MD:  ?Procedure days: Tuesday and Thursday 7:30 AM to 4:00 PM ? ?Bilal Lateef, MD:  ?Procedure days: Monday and Wednesday 7:30 AM to 4:00 PM ?______________________________________________________________________ ? ____________________________________________________________________________________________ ? ?General Risks and Possible Complications ? ?Patient Responsibilities: It is important that you read this as it is part of your informed consent. It is our duty to inform you of the risks and possible complications associated with treatments offered to you. It is your responsibility as a patient to read this and to ask questions about anything that is not clear or that you believe was not covered in this document. ? ?Patient?s Rights: You have the right to refuse treatment. You also have the right to change your mind, even after initially having agreed to have the treatment done. However, under this last option, if you wait until the last second to change your mind, you may be charged for the materials used up to that point. ? ?Introduction: Medicine is not an exact science. Everything in Medicine, including the lack of treatment(s), carries the potential for danger, harm, or loss (which is by definition: Risk). In Medicine, a complication is a secondary problem, condition, or disease that can aggravate an already existing one. All treatments carry the risk of possible complications. The fact that a side effects or complications occurs, does not imply that the treatment was conducted incorrectly. It must be clearly understood that these can happen even when everything is done following the highest safety standards. ? ?No treatment: You can choose not to proceed with the  proposed treatment alternative. The ?PRO(s)? would include: avoiding the risk of complications associated with the therapy. The ?CON(s)? would include: not getting any of the treatment benefits. These benefits fall under one of three categories: diagnostic; therapeutic; and/or palliative. Diagnostic benefits include: getting information which can ultimately lead to improvement of the disease or symptom(s). Therapeutic benefits are those associated with the successful treatment of the disease. Finally, palliative benefits are those related to the decrease of the primary symptoms, without necessarily curing the condition (example: decreasing the pain from a flare-up of a chronic condition, such as incurable terminal cancer). ? ?General Risks and Complications: These are associated to most interventional treatments. They can occur alone, or in combination. They fall under one of the following six (6) categories: no benefit or worsening of symptoms; bleeding; infection; nerve damage; allergic reactions; and/or death. ?No benefits or worsening of symptoms: In Medicine there are no guarantees, only probabilities. No healthcare provider can ever guarantee that a medical treatment will work, they can only state the probability that it may. Furthermore, there is always the possibility that the condition may worsen, either directly, or indirectly, as a consequence of the treatment. ?Bleeding: This is more common if the patient is taking a blood thinner, either prescription or over the counter (example: Goody Powders, Fish oil, Aspirin, Garlic, etc.), or if suffering a condition associated with impaired coagulation (example: Hemophilia, cirrhosis of the liver, low platelet counts, etc.). However, even if you do not have one on these, it can still happen. If you have any of these conditions, or take one of these drugs, make sure to notify your treating physician. ?Infection: This is more common in patients with a compromised  immune system, either due to disease (example:   diabetes, cancer, human immunodeficiency virus [HIV], etc.), or due to medications or treatments (example: therapies used to treat cancer and rheumatological diseas

## 2021-12-02 DIAGNOSIS — M546 Pain in thoracic spine: Secondary | ICD-10-CM | POA: Insufficient documentation

## 2021-12-02 DIAGNOSIS — B354 Tinea corporis: Secondary | ICD-10-CM | POA: Insufficient documentation

## 2021-12-09 ENCOUNTER — Ambulatory Visit: Payer: Medicaid Other | Admitting: Pain Medicine

## 2021-12-15 NOTE — Progress Notes (Signed)
PROVIDER NOTE: Interpretation of information contained herein should be left to medically-trained personnel. Specific patient instructions are provided elsewhere under "Patient Instructions" section of medical record. This document was created in part using STT-dictation technology, any transcriptional errors that may result from this process are unintentional.  ?Patient: Claire Mcdonald ?Type: Established ?DOB: Apr 24, 1966 ?MRN: [id number] ?PCP: [name], MD  Service: Procedure ?DOS: 12/19/2021 ?Setting: Ambulatory ?Location: Ambulatory outpatient facility ?Delivery: Face-to-face Provider: [name], MD ?Specialty: Interventional Pain Management ?Specialty designation: 09 ?Location: Outpatient facility ?Ref. Prov.: [name], MD   ? ?Primary Reason for Visit: Interventional Pain Management Treatment. ?CC: Back Pain (lower) ?  ?Procedure:          ? Type: Lumbar Facet, Medial Branch Block(s)  #[id number]   ?Laterality: Bilateral  ?Level: L2, L3, L4, L5, & S1 Medial Branch Level(s). Injecting these levels blocks the L3-4 and L5-S1 lumbar facet joints. ?Imaging: Fluoroscopic guidance ?Anesthesia: Local anesthesia (1-2% Lidocaine) ?Anxiolysis: IV Versed 2.0 mg ?Sedation: None. ?DOS: 12/19/2021 ?Performed by: [name], MD ? ?Primary Purpose: Diagnostic/Therapeutic ?Indications: Low back pain severe enough to impact quality of life or function. ?1. Lumbar facet syndrome (Bilateral) (R>L)   ?2. Chronic low back pain (Bilateral) w/o sciatica   ?3. Lumbar facet arthropathy   ?4. Spondylosis without myelopathy or radiculopathy, lumbosacral region   ?5. Pars defect of lumbar spine   ?6. Grade 2-3 (13-16 mm) Anterolisthesis of L5 over S1 (06/22/2019)   ?7. 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 Chronic anticoagulation (Xarelto)   ?9. History of allergy to radiographic contrast media   ?10. History of Allergy to iodine   ? ?NAS-11 Pain score:  ? Pre-procedure: 9 /10  ? Post-procedure: 9  /10  ? ?  ?Position / Prep / Materials:  ?Position: Prone  ?Prep solution: DuraPrep (Iodine Povacrylex [0.7% available iodine] and Isopropyl Alcohol, 74% w/w) ?Area Prepped: Posterolateral Lumbosacral Spine (Wide prep: From the lower border of the scapula down to the end of the tailbone and from flank to flank.) ? ?Materials:  ?Tray: Block ?Needle(s):  ?Type: Spinal  ?Gauge (G): 22  ?Length: 5-in ?Qty: 4 ? ?Pre-op H&P Assessment:  ?Claire Mcdonald is a 56 y.o. (year old), female patient, seen today for interventional treatment. She  has a past surgical history that includes Tubal ligation (1998); Ovarian cyst removal (Left); central line (03/25/2015); Hand surgery (Left); Cardiac catheterization (N/A, 03/27/2015); Cardiac catheterization (N/A, 04/11/2015); Cardiac catheterization (N/A, 07/16/2015); Colonoscopy with propofol (N/A, 10/01/2015); Esophagogastroduodenoscopy (egd) with propofol (N/A, 10/01/2015); Abdominal hysterectomy; and Hip Arthroplasty (Right, 02/27/2020). Claire Mcdonald has a current medication list which includes the following prescription(s): acetaminophen, albuterol, atorvastatin, azelastine, gnp calcium 1200, cetirizine, vitamin d3, dicyclomine, famotidine, fluticasone, furosemide, gabapentin, hydroxyzine, magnesium, metoprolol tartrate, polyethylene glycol, promethazine, rivaroxaban, senna-docusate, sertraline, topiramate, and triamcinolone, and the following Facility-Administered Medications: lactated ringers, lidocaine, midazolam, pentafluoroprop-tetrafluoroeth, ropivacaine (pf) 2 mg/ml (0.2%), and triamcinolone acetonide. Her primarily concern today is the Back Pain (lower) ? ?Initial Vital Signs:  ?Pulse/HCG Rate: (!) 51  ?Temp: (!) 96.1 ?F (35.6 ?C) ?Resp: 14 ?BP: (!) 141/97 ?SpO2: 98 % ? ?BMI: Estimated body mass index is 29.85 kg/m? as calculated from the following: ?  Height as of this encounter: '5\' 1"'$  (1.549 m). ?  Weight as of this encounter: 158 lb (71.7 kg). ? ?Risk Assessment: ?Allergies: Reviewed. She  is allergic to tape, iodinated contrast media, toradol [ketorolac tromethamine], tramadol, and zofran [ondansetron hcl].  ?Allergy Precautions: None required ?Coagulopathies: Reviewed.  None identified.  ?Blood-thinner therapy: None at this time ?Active Infection(s): Reviewed. None identified. Claire Mcdonald is afebrile ? ?Site Confirmation: Claire Mcdonald was asked to confirm the procedure and laterality before marking the site ?Procedure checklist: Completed ?Consent: Before the procedure and under the influence of no sedative(s), amnesic(s), or anxiolytics, the patient was informed of the treatment options, risks and possible complications. To fulfill our ethical and legal obligations, as recommended by the American Medical Association's Code of Ethics, I have informed the patient of my clinical impression; the nature and purpose of the treatment or procedure; the risks, benefits, and possible complications of the intervention; the alternatives, including doing nothing; the risk(s) and benefit(s) of the alternative treatment(s) or procedure(s); and the risk(s) and benefit(s) of doing nothing. ?The patient was provided information about the general risks and possible complications associated with the procedure. These may include, but are not limited to: failure to achieve desired goals, infection, bleeding, organ or nerve damage, allergic reactions, paralysis, and death. ?In addition, the patient was informed of those risks and complications associated to Spine-related procedures, such as failure to decrease pain; infection (i.e.: Meningitis, epidural or intraspinal abscess); bleeding (i.e.: epidural hematoma, subarachnoid hemorrhage, or any other type of intraspinal or peri-dural bleeding); organ or nerve damage (i.e.: Any type of peripheral nerve, nerve root, or spinal cord injury) with subsequent damage to sensory, motor, and/or autonomic systems, resulting in permanent pain, numbness, and/or weakness of one or several  areas of the body; allergic reactions; (i.e.: anaphylactic reaction); and/or death. ?Furthermore, the patient was informed of those risks and complications associated with the medications. These include, but are not limited to: allergic reactions (i.e.: anaphylactic or anaphylactoid reaction(s)); adrenal axis suppression; blood sugar elevation that in diabetics may result in ketoacidosis or comma; water retention that in patients with history of congestive heart failure may result in shortness of breath, pulmonary edema, and decompensation with resultant heart failure; weight gain; swelling or edema; medication-induced neural toxicity; particulate matter embolism and blood vessel occlusion with resultant organ, and/or nervous system infarction; and/or aseptic necrosis of one or more joints. ?Finally, the patient was informed that Medicine is not an exact science; therefore, there is also the possibility of unforeseen or unpredictable risks and/or possible complications that may result in a catastrophic outcome. The patient indicated having understood very clearly. We have given the patient no guarantees and we have made no promises. Enough time was given to the patient to ask questions, all of which were answered to the patient's satisfaction. Claire Mcdonald has indicated that she wanted to continue with the procedure. ?Attestation: I, the ordering provider, attest that I have discussed with the patient the benefits, risks, side-effects, alternatives, likelihood of achieving goals, and potential problems during recovery for the procedure that I have provided informed consent. ?Date  Time: 12/19/2021  9:07 AM ? ?Pre-Procedure Preparation:  ?Monitoring: As per clinic protocol. Respiration, ETCO2, SpO2, BP, heart rate and rhythm monitor placed and checked for adequate function ?Safety Precautions: Patient was assessed for positional comfort and pressure points before starting the procedure. ?Time-out: I initiated and  conducted the "Time-out" before starting the procedure, as per protocol. The patient was asked to participate by confirming the accuracy of the "Time Out" information. Verification of the correct person, site, and

## 2021-12-19 ENCOUNTER — Ambulatory Visit
Admission: RE | Admit: 2021-12-19 | Discharge: 2021-12-19 | Disposition: A | Discharge status: Home / Self Care | Payer: Medicaid Other | Source: Ambulatory Visit | Attending: Pain Medicine | Admitting: Pain Medicine

## 2021-12-19 ENCOUNTER — Ambulatory Visit (HOSPITAL_BASED_OUTPATIENT_CLINIC_OR_DEPARTMENT_OTHER): Payer: Medicaid Other | Admitting: Pain Medicine

## 2021-12-19 VITALS — BP 145/97 | HR 51 | Temp 96.1°F | Resp 18 | Ht 61.0 in | Wt 158.0 lb

## 2021-12-19 DIAGNOSIS — Z7901 Long term (current) use of anticoagulants: Secondary | ICD-10-CM | POA: Diagnosis present

## 2021-12-19 DIAGNOSIS — G8929 Other chronic pain: Secondary | ICD-10-CM | POA: Insufficient documentation

## 2021-12-19 DIAGNOSIS — M4306 Spondylolysis, lumbar region: Secondary | ICD-10-CM

## 2021-12-19 DIAGNOSIS — M47816 Spondylosis without myelopathy or radiculopathy, lumbar region: Secondary | ICD-10-CM | POA: Diagnosis present

## 2021-12-19 DIAGNOSIS — M47817 Spondylosis without myelopathy or radiculopathy, lumbosacral region: Secondary | ICD-10-CM | POA: Diagnosis present

## 2021-12-19 DIAGNOSIS — M4310 Spondylolisthesis, site unspecified: Secondary | ICD-10-CM | POA: Insufficient documentation

## 2021-12-19 DIAGNOSIS — M545 Low back pain, unspecified: Secondary | ICD-10-CM | POA: Diagnosis present

## 2021-12-19 DIAGNOSIS — Z888 Allergy status to other drugs, medicaments and biological substances status: Secondary | ICD-10-CM

## 2021-12-19 DIAGNOSIS — Z91041 Radiographic dye allergy status: Secondary | ICD-10-CM | POA: Diagnosis present

## 2021-12-19 DIAGNOSIS — M532X7 Spinal instabilities, lumbosacral region: Secondary | ICD-10-CM

## 2021-12-19 MED ORDER — PENTAFLUOROPROP-TETRAFLUOROETH EX AERO
INHALATION_SPRAY | Freq: Once | CUTANEOUS | Status: AC
  Administered 2021-12-19: 30 via TOPICAL

## 2021-12-19 MED ORDER — LIDOCAINE HCL 2 % IJ SOLN
20.0000 mL | Freq: Once | INTRAMUSCULAR | Status: AC
  Administered 2021-12-19: 400 mg
  Filled 2021-12-19: qty 40

## 2021-12-19 MED ORDER — MIDAZOLAM HCL 5 MG/5ML IJ SOLN
0.5000 mg | Freq: Once | INTRAMUSCULAR | Status: AC
  Administered 2021-12-19: 2 mg via INTRAVENOUS
  Filled 2021-12-19: qty 5

## 2021-12-19 MED ORDER — LACTATED RINGERS IV SOLN
1000.0000 mL | Freq: Once | INTRAVENOUS | Status: AC
  Administered 2021-12-19: 1000 mL via INTRAVENOUS

## 2021-12-19 MED ORDER — TRIAMCINOLONE ACETONIDE 40 MG/ML IJ SUSP
80.0000 mg | Freq: Once | INTRAMUSCULAR | Status: AC
  Administered 2021-12-19: 80 mg
  Filled 2021-12-19: qty 2

## 2021-12-19 MED ORDER — ROPIVACAINE HCL 2 MG/ML IJ SOLN
18.0000 mL | Freq: Once | INTRAMUSCULAR | Status: AC
  Administered 2021-12-19: 18 mL via PERINEURAL
  Filled 2021-12-19: qty 20

## 2021-12-20 ENCOUNTER — Telehealth: Payer: Self-pay

## 2021-12-20 NOTE — Telephone Encounter (Signed)
Post procedure phone call.  Patient states she is doing well.  

## 2021-12-21 ENCOUNTER — Emergency Department: Payer: Medicaid Other

## 2021-12-21 ENCOUNTER — Observation Stay
Admission: EM | Admit: 2021-12-21 | Discharge: 2021-12-22 | Disposition: A | Discharge status: Home / Self Care | Payer: Medicaid Other | Attending: Family Medicine | Admitting: Family Medicine

## 2021-12-21 DIAGNOSIS — I2510 Atherosclerotic heart disease of native coronary artery without angina pectoris: Secondary | ICD-10-CM | POA: Diagnosis not present

## 2021-12-21 DIAGNOSIS — R1011 Right upper quadrant pain: Secondary | ICD-10-CM | POA: Insufficient documentation

## 2021-12-21 DIAGNOSIS — Z96641 Presence of right artificial hip joint: Secondary | ICD-10-CM | POA: Diagnosis not present

## 2021-12-21 DIAGNOSIS — Z86718 Personal history of other venous thrombosis and embolism: Secondary | ICD-10-CM | POA: Diagnosis not present

## 2021-12-21 DIAGNOSIS — R079 Chest pain, unspecified: Principal | ICD-10-CM | POA: Diagnosis present

## 2021-12-21 DIAGNOSIS — I890 Lymphedema, not elsewhere classified: Secondary | ICD-10-CM

## 2021-12-21 DIAGNOSIS — I509 Heart failure, unspecified: Secondary | ICD-10-CM | POA: Diagnosis not present

## 2021-12-21 DIAGNOSIS — J45909 Unspecified asthma, uncomplicated: Secondary | ICD-10-CM | POA: Insufficient documentation

## 2021-12-21 DIAGNOSIS — I110 Hypertensive heart disease with heart failure: Secondary | ICD-10-CM | POA: Diagnosis not present

## 2021-12-21 DIAGNOSIS — I10 Essential (primary) hypertension: Secondary | ICD-10-CM | POA: Diagnosis present

## 2021-12-21 DIAGNOSIS — Z7901 Long term (current) use of anticoagulants: Secondary | ICD-10-CM | POA: Diagnosis not present

## 2021-12-21 DIAGNOSIS — R002 Palpitations: Secondary | ICD-10-CM | POA: Diagnosis not present

## 2021-12-21 DIAGNOSIS — Z8673 Personal history of transient ischemic attack (TIA), and cerebral infarction without residual deficits: Secondary | ICD-10-CM | POA: Diagnosis not present

## 2021-12-21 DIAGNOSIS — Z79899 Other long term (current) drug therapy: Secondary | ICD-10-CM | POA: Diagnosis not present

## 2021-12-21 DIAGNOSIS — G40909 Epilepsy, unspecified, not intractable, without status epilepticus: Secondary | ICD-10-CM

## 2021-12-21 DIAGNOSIS — R109 Unspecified abdominal pain: Secondary | ICD-10-CM | POA: Diagnosis present

## 2021-12-21 HISTORY — DX: Cerebral infarction, unspecified: I63.9

## 2021-12-21 LAB — COMPREHENSIVE METABOLIC PANEL
ALT: 7 U/L (ref 0–44)
AST: 20 U/L (ref 15–41)
Albumin: 3.8 g/dL (ref 3.5–5.0)
Alkaline Phosphatase: 54 U/L (ref 38–126)
Anion gap: 11 (ref 5–15)
BUN: 15 mg/dL (ref 6–20)
CO2: 22 mmol/L (ref 22–32)
Calcium: 9.2 mg/dL (ref 8.9–10.3)
Chloride: 106 mmol/L (ref 98–111)
Creatinine, Ser: 0.59 mg/dL (ref 0.44–1.00)
GFR, Estimated: >60 mL/min (ref >60)
Glucose, Bld: 111 mg/dL — ABNORMAL HIGH (ref 70–99)
Potassium: 4.1 mmol/L (ref 3.5–5.1)
Sodium: 139 mmol/L (ref 135–145)
Total Bilirubin: 0.9 mg/dL (ref 0.3–1.2)
Total Protein: 6.5 g/dL (ref 6.5–8.1)

## 2021-12-21 LAB — CBC WITH DIFFERENTIAL/PLATELET
Abs Immature Granulocytes: 0.04 10*3/uL (ref 0.00–0.07)
Basophils Absolute: 0 10*3/uL (ref 0.0–0.1)
Basophils Relative: 0 %
Eosinophils Absolute: 0 10*3/uL (ref 0.0–0.5)
Eosinophils Relative: 0 %
HCT: 48.7 % — ABNORMAL HIGH (ref 36.0–46.0)
Hemoglobin: 15.3 g/dL — ABNORMAL HIGH (ref 12.0–15.0)
Immature Granulocytes: 1 %
Lymphocytes Relative: 18 %
Lymphs Abs: 1.2 10*3/uL (ref 0.7–4.0)
MCH: 29.7 pg (ref 26.0–34.0)
MCHC: 31.4 g/dL (ref 30.0–36.0)
MCV: 94.6 fL (ref 80.0–100.0)
Monocytes Absolute: 0.5 10*3/uL (ref 0.1–1.0)
Monocytes Relative: 7 %
Neutro Abs: 5.2 10*3/uL (ref 1.7–7.7)
Neutrophils Relative %: 74 %
Platelets: 124 10*3/uL — ABNORMAL LOW (ref 150–400)
RBC: 5.15 MIL/uL — ABNORMAL HIGH (ref 3.87–5.11)
RDW: 13.9 % (ref 11.5–15.5)
WBC: 6.9 10*3/uL (ref 4.0–10.5)
nRBC: 0 % (ref 0.0–0.2)

## 2021-12-21 LAB — T4, FREE: Free T4: 0.82 ng/dL (ref 0.61–1.12)

## 2021-12-21 LAB — URINALYSIS, ROUTINE W REFLEX MICROSCOPIC
Bilirubin Urine: NEGATIVE
Glucose, UA: NEGATIVE mg/dL
Hgb urine dipstick: NEGATIVE
Ketones, ur: NEGATIVE mg/dL
Leukocytes,Ua: NEGATIVE
Nitrite: NEGATIVE
Protein, ur: NEGATIVE mg/dL
Specific Gravity, Urine: 1.003 — ABNORMAL LOW (ref 1.005–1.030)
pH: 7 (ref 5.0–8.0)

## 2021-12-21 LAB — TROPONIN I (HIGH SENSITIVITY)
Troponin I (High Sensitivity): 6 ng/L (ref <18)
Troponin I (High Sensitivity): 5 ng/L (ref <18)

## 2021-12-21 LAB — LIPASE, BLOOD: Lipase: 26 U/L (ref 11–51)

## 2021-12-21 LAB — TSH: TSH: 0.459 u[IU]/mL (ref 0.350–4.500)

## 2021-12-21 LAB — MAGNESIUM: Magnesium: 2.3 mg/dL (ref 1.7–2.4)

## 2021-12-21 MED ORDER — FENTANYL CITRATE PF 50 MCG/ML IJ SOSY
50.0000 ug | PREFILLED_SYRINGE | Freq: Once | INTRAMUSCULAR | Status: AC
  Administered 2021-12-21: 50 ug via INTRAVENOUS
  Filled 2021-12-21 (×2): qty 1

## 2021-12-21 MED ORDER — FENTANYL CITRATE PF 50 MCG/ML IJ SOSY
50.0000 ug | PREFILLED_SYRINGE | Freq: Once | INTRAMUSCULAR | Status: AC
  Administered 2021-12-21: 50 ug via INTRAVENOUS
  Filled 2021-12-21: qty 1

## 2021-12-21 NOTE — ED Notes (Signed)
IV team at bedside 

## 2021-12-21 NOTE — Discharge Instructions (Signed)
Please call your cardiologist on Monday to get a follow-up appointment.  Appears that you are having premature ventricular contractions that cause be causing you your palpitations. ?

## 2021-12-21 NOTE — ED Notes (Signed)
Attempted IV [place] with no success. ?

## 2021-12-21 NOTE — ED Provider Notes (Addendum)
? ?[facility] ?Provider Note ? ? ? Event Date/Time  ? First MD Initiated Contact with Patient 12/21/21 1637   ?  (approximate) ? ? ?History  ? ?CP ? ?HPI ? ?Claire Mcdonald is a 56 y.o. 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  Patient reports feeling palpitations in her heart that started about 2 hours ago with a little bit of discomfort.  She also reports having some right lower quadrant pain that is been going on for the past few days but started get worse with these palpitations.  The pain feels separate they are not connected.  She denies feeling short of breath. ? ?On review of records patient was seen on 3/29 and had a CT scan showing a small right-sided inguinal hernia but otherwise was negative..  I reviewed her other labs and it does appear that she has frequent ER visits for this right lower quadrant pain that is overall been reassuring.  She has also seen by pain management clinic on 06/11/2021. ? ?Physical Exam  ? ?Triage Vital Signs: ?ED Triage Vitals  ?Enc Vitals Group  ?   BP 12/21/21 1640 106/77  ?   Pulse Rate 12/21/21 1640 (!) 47  ?   Resp 12/21/21 1640 16  ?   Temp 12/21/21 1640 97.8 ?F (36.6 ?C)  ?   Temp Source 12/21/21 1640 Oral  ?   SpO2 12/21/21 1640 99 %  ?   Weight 12/21/21 1643 160 lb (72.6 kg)  ?   Height 12/21/21 1643 '5\' 1"'$  (1.549 m)  ?   Head Circumference --   ?   Peak Flow --   ?   Pain Score 12/21/21 1642 9  ?   Pain Loc --   ?   Pain Edu? --   ?   Excl. in [place]? --   ? ? ?Most recent vital signs: ?Vitals:  ? 12/21/21 1640  ?BP: 106/77  ?Pulse: (!) 47  ?Resp: 16  ?Temp: 97.8 ?F (36.6 ?C)  ?SpO2: 99%  ? ? ? ?General: Awake, no distress.  ?CV:  Good peripheral perfusion.  Bradycardic ?Resp:  Normal effort.  ?Abd:  No distention.  Slightly tender in the right lower quadrant. ?Other:  Patient has lymphedema at her baseline without any calf tenderness. ? ? ?ED  Results / Procedures / Treatments  ? ?Labs ?(all labs ordered are listed, but only abnormal results are displayed) ?Labs Reviewed  ?CBC WITH DIFFERENTIAL/PLATELET - Abnormal; Notable for the following components:  ?    Result Value  ? RBC 5.15 (*)   ? Hemoglobin 15.3 (*)   ? HCT 48.7 (*)   ? Platelets 124 (*)   ? All other components within normal limits  ?URINALYSIS, ROUTINE W REFLEX MICROSCOPIC - Abnormal; Notable for the following components:  ? Color, Urine STRAW (*)   ? APPearance CLEAR (*)   ? Specific Gravity, Urine 1.003 (*)   ? All other components within normal limits  ?COMPREHENSIVE METABOLIC PANEL - Abnormal; Notable for the following components:  ? Glucose, Bld 111 (*)   ? All other components within normal limits  ?T4, FREE  ?LIPASE, BLOOD  ?MAGNESIUM  ?TSH  ?TROPONIN I (HIGH SENSITIVITY)  ?TROPONIN I (HIGH SENSITIVITY)  ? ? ? ?EKG ? ?My interpretation of EKG: ? ?Sinus bradycardia rate of 49 without any ST elevation, T wave version lead III, normal intervals ? ?  RADIOLOGY ?I have reviewed the xray personally without any pneumonia but does have a ICD noted ? ? ?PROCEDURES: ? ?Critical Care performed: No ? ?.1-3 Lead EKG Interpretation ?Performed by: Vanessa [place], MD ?Authorized by: Vanessa [place], MD  ? ?  Interpretation: abnormal   ?  ECG rate:  40 ?  ECG rate assessment: bradycardic   ?  Rhythm: sinus bradycardia   ?  Ectopy: none   ?  Conduction: normal   ? ? ?MEDICATIONS ORDERED IN ED: ?Medications  ?fentaNYL (SUBLIMAZE) injection 50 mcg (50 mcg Intravenous Given 12/21/21 1944)  ? ? ? ?IMPRESSION / MDM / ASSESSMENT AND PLAN / ED COURSE  ?I reviewed the triage vital signs and the nursing notes. ? ? ?Patient comes in with some chest fluttering.  On cardiac monitor she appears to be in sinus bradycardia.  We will continue to monitor.  Labs ordered evaluate for any Electra abnormalities, AKI, thyroid dysfunction.  She does report some right lower quadrant pain which has had multiple CTs for the been  unrevealing. ? ?Patient has significantly difficulty getting IV access which is what caused a delay in her blood work as well as with multiple hemolyzed cysts in her labs.  On repeat assessments patient abdomen is soft and nontender she reports already having her appendix removed.  Discussed that she is had prior CT imaging for this and she reports that this is very similar to her normal chronic pain and declines further work-up for it at this time.  She reports that her bigger concerns are the palpitations. ? ?Patient does have occasional PACs which I wonder if are contributing to her symptoms.  Patient is already bradycardic at baseline so I do not feel she would tolerate a beta-blocker to help with this.  She has a cardiologist that she states that she can call to make a follow-up appointment.  Her troponins are negative x2.  Labs are reassuring without any hypokalemia or hyper magnesium.  Her thyroids are normal.  Belly labs are normal.  No evidence of infection or anemia. ? ?Reviewed patient's heart rates that she typically ran 49 through 60 on prior visits.  We discussed how she is on metoprolol and this can sometimes make the PACs better but it can also lower her heart rate so it would be difficult to adjust this.  Given patient's history I think admission for cardiac monitoring, repeat echocardiogram would be best ? ?PVC on the interrogation but otherwise looks good.  ? ?The patient is on the cardiac monitor to evaluate for evidence of arrhythmia and/or significant heart rate changes. ? ?  ? ? ?FINAL CLINICAL IMPRESSION(S) / ED DIAGNOSES  ? ?Final diagnoses:  ?Palpitations  ? ? ? ?Rx / DC Orders  ? ?ED Discharge Orders   ? ? None  ? ?  ? ? ? ?Note:  This document was prepared using Dragon voice recognition software and may include unintentional dictation errors. ?  ?Vanessa [place], MD ?12/21/21 2324 ? ?  ?Vanessa [place], MD ?12/21/21 2338 ? ?  ?Vanessa , MD ?12/21/21 2351 ? ?

## 2021-12-22 ENCOUNTER — Other Ambulatory Visit: Payer: Self-pay

## 2021-12-22 ENCOUNTER — Encounter: Payer: Self-pay | Admitting: Internal Medicine

## 2021-12-22 ENCOUNTER — Observation Stay (HOSPITAL_BASED_OUTPATIENT_CLINIC_OR_DEPARTMENT_OTHER)
Admit: 2021-12-22 | Discharge: 2021-12-22 | Disposition: A | Discharge status: Home / Self Care | Payer: Medicaid Other | Attending: Internal Medicine | Admitting: Internal Medicine

## 2021-12-22 ENCOUNTER — Emergency Department: Payer: Medicaid Other

## 2021-12-22 DIAGNOSIS — R079 Chest pain, unspecified: Secondary | ICD-10-CM | POA: Diagnosis not present

## 2021-12-22 DIAGNOSIS — R1031 Right lower quadrant pain: Secondary | ICD-10-CM | POA: Diagnosis not present

## 2021-12-22 LAB — ECHOCARDIOGRAM COMPLETE
AR max vel: 1.81 cm2
AV Area VTI: 1.85 cm2
AV Area mean vel: 1.9 cm2
AV Mean grad: 4 mmHg
AV Peak grad: 6.9 mmHg
Ao pk vel: 1.31 m/s
Area-P 1/2: 2.86 cm2
Calc EF: 61.6 %
Height: 61 in
S' Lateral: 2.78 cm
Single Plane A2C EF: 58 %
Single Plane A4C EF: 59 %
Weight: 2560 oz

## 2021-12-22 LAB — HIV ANTIBODY (ROUTINE TESTING W REFLEX): HIV Screen 4th Generation wRfx: NONREACTIVE

## 2021-12-22 MED ORDER — ISOSORBIDE MONONITRATE ER 30 MG PO TB24
15.0000 mg | ORAL_TABLET | Freq: Every day | ORAL | Status: DC
  Administered 2021-12-22: 15 mg via ORAL
  Filled 2021-12-22: qty 1

## 2021-12-22 MED ORDER — NITROGLYCERIN 0.4 MG SL SUBL
0.4000 mg | SUBLINGUAL_TABLET | SUBLINGUAL | Status: DC | PRN
  Administered 2021-12-22: 0.4 mg via SUBLINGUAL
  Filled 2021-12-22: qty 1

## 2021-12-22 MED ORDER — ISOSORBIDE MONONITRATE ER 30 MG PO TB24: 15.0000 mg | ORAL_TABLET | Freq: Every day | ORAL | 1 refills | Status: AC

## 2021-12-22 MED ORDER — ACETAMINOPHEN 325 MG PO TABS
650.0000 mg | ORAL_TABLET | ORAL | Status: DC | PRN
  Administered 2021-12-22: 650 mg via ORAL
  Filled 2021-12-22: qty 2

## 2021-12-22 NOTE — Assessment & Plan Note (Signed)
As needed pain medications.  Chronic low back pain. ?

## 2021-12-22 NOTE — Assessment & Plan Note (Signed)
Blood pressure 122/81, pulse 61, temperature (!) 97.5 ?F (36.4 ?C), temperature source Oral, resp. rate 20, height '5\' 1"'$  (1.549 m), weight 72.6 kg, last menstrual period 01/08/2018, SpO2 99 %. ?Patient continued on her atorvastatin and Lasix and metoprolol. ? ?

## 2021-12-22 NOTE — ED Notes (Signed)
Patient eating breakfast tray at this time. 

## 2021-12-22 NOTE — H&P (Signed)
?History and Physical  ? ? ?Patient: Claire Mcdonald VQM:[id number] DOB: 12-19-1965 ?DOA: 12/21/2021 ?DOS: the patient was seen and examined on 12/22/2021 ?PCP: [name], MD  ?Patient coming from: Home ? ?Chief Complaint: No chief complaint on file. ? ?HPI: Claire Mcdonald is a 56 y.o. female with medical history significant of Seizure disorder, UTI presenting with chest pain and abdominal pain.  Patient reported chest pain of the chest patient also ordered right upper quadrant pain. ?>>Chest Pain: ?Duration:2 days  ? ?Frequency:intermittent  ? ?Location:left side of chest.  ? ?Quality:pressure [place]. ? ?Rate:10/10  ? ?Radiation:radiating to her AICD device.  ? ?Aggravating:None  ? ?Alleviating:rest  ? ?Associated factors:palpitations . ? ?The palpitation got worse. The cardiologist said she has small hole and thought about closing the hole but changed their mind.  Cardiology is Dr. Einar Pheasant dean in [place].  ?Her next appt with him is may 16th.  ? ? ?>>Abdominal pain: ? ?Duration: few weeks ago.  ? ?Frequency:intermittent. ? ?Location:rt lower abdomin. ? ?Quality:dull and sharp.  ? ?Rate:10/10  ? ?Radiation:NR  ? ?Aggravating:none  ? ?Alleviating:rest  ? ?Associated factors:pt reports nausea.  ? ? ? ? ?Review of Systems  ?Respiratory:  Negative for shortness of breath.   ?Cardiovascular:  Positive for chest pain and palpitations. Negative for leg swelling.  ?Gastrointestinal:  Positive for abdominal pain and nausea.  ?All other systems reviewed and are negative. ? ?Past Medical History:  ?Diagnosis Date  ? Allergy   ? Anxiety   ? Asthma 05/06/2016  ? Cardiac arrest [facility]) 2016  ? Coronary artery disease   ? DDD (degenerative disc disease), cervical   ? Depression   ? Diverticulosis   ? seen on CT 2015  ? DVT (deep venous thrombosis) ([place])   ? GERD (gastroesophageal reflux disease)   ? Heart failure ([place])   ? High cholesterol   ? Hyperlipidemia   ? Hypertension   ? 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 V. fib, EF nl,  non-obs CAD; St. Jude ICD  ? Migraine   ? Obesity (BMI 30-39.9)   ? Seizures ([place])   ? Stroke [facility])   ? Ventricular tachycardia ([place])   ? ?Past Surgical History:  ?Procedure Laterality Date  ? ABDOMINAL HYSTERECTOMY    ? CARDIAC CATHETERIZATION N/A 03/27/2015  ? Procedure: Left Heart Cath and Coronary Angiography;  Surgeon: Troy Sine, MD;  LAD 10%/30%, CFX and RCA OK, EF normal  ? CENTRAL LINE  03/25/2015  ?    ? COLONOSCOPY WITH PROPOFOL N/A 10/01/2015  ? Procedure: COLONOSCOPY WITH PROPOFOL;  Surgeon: Josefine Class, MD;  Location: [facility] ENDOSCOPY;  Service: Endoscopy;  Laterality: N/A;  ? EP IMPLANTABLE DEVICE N/A 04/11/2015  ? Procedure: ICD Implant;  Surgeon: Deboraha Sprang, MD;  [facility]  ICD, serial number  [id number]  ? EP IMPLANTABLE DEVICE N/A 07/16/2015  ? Procedure: Lead Revision/Repair;  Surgeon: Will Meredith Leeds, MD;  Location: [place] CV LAB;  Service: Cardiovascular;  Laterality: N/A;  ? ESOPHAGOGASTRODUODENOSCOPY (EGD) WITH PROPOFOL N/A 10/01/2015  ? Procedure: ESOPHAGOGASTRODUODENOSCOPY (EGD) WITH PROPOFOL;  Surgeon: Josefine Class, MD;  Location: [facility] ENDOSCOPY;  Service: Endoscopy;  Laterality: N/A;  ? HAND SURGERY Left   ? HIP ARTHROPLASTY Right 02/27/2020  ? Procedure: ARTHROPLASTY BIPOLAR HIP (HEMIARTHROPLASTY);  Surgeon: Lovell Sheehan, MD;  Location: [facility] ORS;  Service: Orthopedics;  Laterality: Right;  ? OVARIAN CYST REMOVAL Left   ? TUBAL LIGATION  1998  ? ?Social History:  reports that she has never smoked. She has never used smokeless tobacco. She reports that she does not drink alcohol and does not use drugs. ? ?Allergies  ?Allergen Reactions  ? Tape Other (See Comments)  ?  likely allergy to adhesive/ dermabond skin glue - blisters noted at edge of adhesives ?likely allergy to adhesive/ dermabond skin glue - blisters noted at edge of adhesives ?  ? Iodinated Contrast Media Rash  ?  On face   ? Toradol [Ketorolac Tromethamine] Itching and Rash  ? Tramadol Itching and Rash  ?  Zofran [Ondansetron Hcl] Itching and Rash  ? ? ?Family History  ?Problem Relation Age of Onset  ? Diabetes Father   ? Asthma Father   ? Hyperlipidemia Father   ? Hypertension Father   ? Kidney disease Father   ? Asthma Mother   ? Depression Mother   ? Hyperlipidemia Mother   ? Hypertension Mother   ? Cancer Maternal Grandmother   ? Early death Maternal Grandmother   ? Breast cancer Maternal Grandmother   ? Early death Paternal Grandmother   ? Allergies Daughter   ? Allergies Son   ? Allergies Son   ? Heart disease Son   ? Breast cancer Paternal Aunt   ? Stroke Neg Hx   ? ? ?Prior to Admission medications   ?Medication Sig Start Date End Date Taking? Authorizing Provider  ?acetaminophen (TYLENOL) 500 MG tablet Take by mouth. 11/21/18   [provider]  ?albuterol (PROVENTIL HFA;VENTOLIN HFA) 108 (90 BASE) MCG/ACT inhaler Inhale 2 puffs into the lungs every 6 (six) hours as needed for wheezing or shortness of breath. 01/23/15   Menshew, Dannielle Karvonen, PA-C  ?atorvastatin (LIPITOR) 20 MG tablet Take 1 tablet (20 mg total) by mouth daily at 6 PM. ?Patient taking differently: Take 40 mg by mouth daily at 6 PM. 07/06/15   [name], NP  ?azelastine (ASTELIN) 0.1 % nasal spray 1 spray by Each Nare route Two (2) times a day. Use in each nostril as directed 12/01/19 11/25/21  [provider]  ?Calcium Carbonate-Vit D-Min (GNP CALCIUM 1200) 1200-1000 MG-UNIT CHEW Chew 1,200 mg by mouth daily with breakfast. Take in combination with vitamin D and magnesium. 06/13/19 11/25/21  Milinda Pointer, MD  ?cetirizine (ZYRTEC) 10 MG tablet Take by mouth. 08/23/18 11/25/21  [provider]  ?Cholecalciferol (VITAMIN D3) 125 MCG (5000 UT) TABS Take by mouth. 11/23/18   [provider]  ?dicyclomine (BENTYL) 20 MG tablet Take by mouth. 08/11/19   [provider]  ?famotidine (PEPCID) 20 MG tablet Take 1 tablet (20 mg total) by mouth 2 (two) times daily. 03/27/19 11/25/21  Nance Pear, MD   ?fluticasone (FLOVENT HFA) 110 MCG/ACT inhaler Inhale into the lungs. 05/22/17 11/25/21  [provider]  ?furosemide (LASIX) 20 MG tablet Take 20 mg by mouth daily and may take an additional daily as directed 09/21/15   Deboraha Sprang, MD  ?gabapentin (NEURONTIN) 300 MG capsule Take 300 mg by mouth 3 (three) times daily.     [provider]  ?hydrOXYzine (ATARAX/VISTARIL) 25 MG tablet Take 25 mg by mouth as needed. 01/14/19   [provider]  ?Magnesium 400 MG TABS Take 400 mg by mouth daily.    [provider]  ?metoprolol tartrate (LOPRESSOR) 25 MG tablet Take 0.5 tablets (12.5 mg total) by mouth 2 (two) times daily. ?Patient taking differently: Take 25 mg by mouth 2 (two) times daily. 08/03/15   [name], NP  ?  polyethylene glycol (MIRALAX / GLYCOLAX) 17 g packet Take 17 g by mouth daily. 03/01/20   Bonnielee Haff, MD  ?promethazine (PHENERGAN) 12.5 MG tablet Take by mouth. 12/24/18   [provider]  ?rivaroxaban (XARELTO) 20 MG TABS tablet Take 20 mg by mouth daily with supper.    [provider]  ?senna-docusate (SENOKOT-S) 8.6-50 MG tablet Take 1 tablet by mouth 2 (two) times daily. 03/01/20   Bonnielee Haff, MD  ?sertraline (ZOLOFT) 100 MG tablet TAKE (1) TABLET BY MOUTH EVERY DAY 02/23/20   [provider]  ?topiramate (TOPAMAX) 100 MG tablet Take 1 tablet (100 mg total) by mouth 2 (two) times daily. 04/12/15   Kinnie Feil, MD  ?triamcinolone (KENALOG) 0.025 % ointment APPLY TOPICALLY TWICE DAILY AS NEEDED FOR ITCH. YOU CAN ALSO MIX WITHAQUAPHOROR VASELINE IN A 1:1 RATIO. AVOID FACE AND SKIN FOLDS 01/03/19   [provider]  ? ? ?Physical Exam: ?Vitals:  ? 12/21/21 1643 12/21/21 1942 12/21/21 2204 12/21/21 2343  ?BP:  (!) 122/95 120/78 122/81  ?Pulse:  63 (!) 45 61  ?Resp:  '20 18 20  '$ ?Temp:   (!) 97.5 ?F (36.4 ?C)   ?TempSrc:   Oral   ?SpO2:  99% 98% 99%  ?Weight: 72.6 kg     ?Height: '5\' 1"'$  (1.549 m)     ? ?Physical Exam ?Vitals and  nursing note reviewed.  ?Constitutional:   ?   General: She is not in acute distress. ?   Appearance: Normal appearance. She is not ill-appearing, toxic-appearing or diaphoretic.  ?HENT:  ?   Head: Normocephalic an

## 2021-12-22 NOTE — Assessment & Plan Note (Signed)
Patient is significant swelling in both her legs has been willing to use lymphedema clinic. ?She may continue to follow with [facility] . ? ?

## 2021-12-22 NOTE — TOC Transition Note (Signed)
Transition of Care (TOC) - CM/SW Discharge Note ? ? ?Patient Details  ?Name: Claire Mcdonald ?MRN: [id number] ?Date of Birth: Nov 29, 1965 ? ?Transition of Care (TOC) CM/SW Contact:  ?Izola Price, RN ?Phone Number: ?12/22/2021, 9:03 AM ? ? ?Clinical Narrative: 4/30: Patient admitted 4/29 in OBS status. Has discharge orders to home/self care with follow up appointments following cardiology consult. Simmie Davies RN CM    ? ? ? ?  ?  ? ? ?Patient Goals and CMS Choice ?  ?  ?  ? ?Discharge Placement ?  ?           ?  ?  ?  ?  ? ?Discharge Plan and Services ?  ?  ?           ?  ?  ?  ?  ?  ?  ?  ?  ?  ?  ? ?Social Determinants of Health (SDOH) Interventions ?  ? ? ?Readmission Risk Interventions ?   ? View : No data to display.  ?  ?  ?  ? ? ? ? ? ?

## 2021-12-22 NOTE — Assessment & Plan Note (Signed)
Patient presenting with chest pain troponins negative. ?Differentials include cardiac and noncardiac, will consult cardiology Dr. Humphrey Rolls further evaluated. ?

## 2021-12-22 NOTE — Consult Note (Signed)
? ? ?Claire Mcdonald is a 56 y.o. female  [id number] ? ?Primary Cardiologist: Neoma Laming ?Reason for Consultation: Chest pain ? ?HPI: This is a 56 year old white female with a history of mild coronary artery disease high cholesterol hypertension with history of idiopathic cardiac arrest and [employer] Jude ICD with nonobstructive coronary artery disease presented to the hospital with chest pain. ? ? ?Review of Systems: No orthopnea PND or leg swelling ? ? ?Past Medical History:  ?Diagnosis Date  ? Allergy   ? Anxiety   ? Asthma 05/06/2016  ? Cardiac arrest [facility]) 2016  ? Coronary artery disease   ? DDD (degenerative disc disease), cervical   ? Depression   ? Diverticulosis   ? seen on CT 2015  ? DVT (deep venous thrombosis) ([place])   ? GERD (gastroesophageal reflux disease)   ? Heart failure ([place])   ? High cholesterol   ? Hyperlipidemia   ? Hypertension   ? 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 V. fib, EF nl, non-obs CAD; St. Jude ICD  ? Migraine   ? Obesity (BMI 30-39.9)   ? Seizures ([place])   ? Stroke [facility])   ? Ventricular tachycardia ([place])   ? ? ?Medications Prior to Admission  ?Medication Sig Dispense Refill  ? atorvastatin (LIPITOR) 20 MG tablet Take 1 tablet (20 mg total) by mouth daily at 6 PM. (Patient taking differently: Take 40 mg by mouth daily at 6 PM.) 90 tablet 1  ? Cholecalciferol (VITAMIN D3) 125 MCG (5000 UT) TABS Take by mouth.    ? furosemide (LASIX) 20 MG tablet Take 20 mg by mouth daily and may take an additional daily as directed 90 tablet 3  ? gabapentin (NEURONTIN) 300 MG capsule Take 300 mg by mouth 3 (three) times daily.     ? ketoconazole (NIZORAL) 2 % cream Apply 1 application. topically 2 (two) times daily.    ? lisinopril (ZESTRIL) 10 MG tablet Take 10 mg by mouth daily.    ? Magnesium 400 MG TABS Take 400 mg by mouth daily.    ? metoprolol tartrate (LOPRESSOR) 25 MG tablet Take 0.5 tablets (12.5 mg total) by mouth 2 (two) times daily. (Patient taking differently: Take 25 mg by mouth 2 (two)  times daily.) 60 tablet 1  ? rivaroxaban (XARELTO) 20 MG TABS tablet Take 20 mg by mouth daily with supper.    ? sertraline (ZOLOFT) 100 MG tablet TAKE (1) TABLET BY MOUTH EVERY DAY    ? topiramate (TOPAMAX) 100 MG tablet Take 1 tablet (100 mg total) by mouth 2 (two) times daily. 60 tablet 1  ? triamcinolone (KENALOG) 0.025 % ointment APPLY TOPICALLY TWICE DAILY AS NEEDED FOR ITCH. YOU CAN ALSO MIX WITHAQUAPHOROR VASELINE IN A 1:1 RATIO. AVOID FACE AND SKIN FOLDS    ? acetaminophen (TYLENOL) 500 MG tablet Take by mouth.    ? albuterol (PROVENTIL HFA;VENTOLIN HFA) 108 (90 BASE) MCG/ACT inhaler Inhale 2 puffs into the lungs every 6 (six) hours as needed for wheezing or shortness of breath. 1 Inhaler 0  ? azelastine (ASTELIN) 0.1 % nasal spray 1 spray by Each Nare route Two (2) times a day. Use in each nostril as directed    ? Calcium Carbonate-Vit D-Min (GNP CALCIUM 1200) 1200-1000 MG-UNIT CHEW Chew 1,200 mg by mouth daily with breakfast. Take in combination with vitamin D and magnesium. 90 tablet 3  ? cetirizine (ZYRTEC) 10 MG tablet Take by mouth.    ? dicyclomine (BENTYL) 20 MG tablet  Take by mouth. (Patient not taking: Reported on 12/22/2021)    ? famotidine (PEPCID) 20 MG tablet Take 1 tablet (20 mg total) by mouth 2 (two) times daily. 60 tablet 1  ? fluticasone (FLOVENT HFA) 110 MCG/ACT inhaler Inhale into the lungs.    ? hydrOXYzine (ATARAX/VISTARIL) 25 MG tablet Take 25 mg by mouth as needed. (Patient not taking: Reported on 12/22/2021)    ? polyethylene glycol (MIRALAX / GLYCOLAX) 17 g packet Take 17 g by mouth daily. 14 each 0  ? promethazine (PHENERGAN) 12.5 MG tablet Take by mouth. (Patient not taking: Reported on 12/22/2021)    ? senna-docusate (SENOKOT-S) 8.6-50 MG tablet Take 1 tablet by mouth 2 (two) times daily. 60 tablet 0  ? ? ? ? ? ?Infusions: ? ? ?Allergies  ?Allergen Reactions  ? Tape Other (See Comments)  ?  likely allergy to adhesive/ dermabond skin glue - blisters noted at edge of  adhesives ?likely allergy to adhesive/ dermabond skin glue - blisters noted at edge of adhesives ?  ? Iodinated Contrast Media Rash  ?  On face   ? Toradol [Ketorolac Tromethamine] Itching and Rash  ? Tramadol Itching and Rash  ? Zofran [Ondansetron Hcl] Itching and Rash  ? ? ?Social History  ? ?Socioeconomic History  ? Marital status: Single  ?  Spouse name: Not on file  ? Number of children: Not on file  ? Years of education: Not on file  ? Highest education level: Not on file  ?Occupational History  ? Not on file  ?Tobacco Use  ? Smoking status: Never  ? Smokeless tobacco: Never  ?Vaping Use  ? Vaping Use: Never used  ?Substance and Sexual Activity  ? Alcohol use: No  ? Drug use: No  ? Sexual activity: Yes  ?  Birth control/protection: Surgical  ?Other Topics Concern  ? Not on file  ?Social History Narrative  ? Lives in [place], [place].  ? ?Social Determinants of Health  ? ?Financial Resource Strain: Not on file  ?Food Insecurity: Not on file  ?Transportation Needs: Not on file  ?Physical Activity: Not on file  ?Stress: Not on file  ?Social Connections: Not on file  ?Intimate Partner Violence: Not on file  ? ? ?Family History  ?Problem Relation Age of Onset  ? Diabetes Father   ? Asthma Father   ? Hyperlipidemia Father   ? Hypertension Father   ? Kidney disease Father   ? Asthma Mother   ? Depression Mother   ? Hyperlipidemia Mother   ? Hypertension Mother   ? Cancer Maternal Grandmother   ? Early death Maternal Grandmother   ? Breast cancer Maternal Grandmother   ? Early death Paternal Grandmother   ? Allergies Daughter   ? Allergies Son   ? Allergies Son   ? Heart disease Son   ? Breast cancer Paternal Aunt   ? Stroke Neg Hx   ? ? ?PHYSICAL EXAM: ?Vitals:  ? 12/22/21 0707 12/22/21 0719  ?BP: (!) 146/93 (!) 146/93  ?Pulse: (!) 56 (!) 56  ?Resp: 20 16  ?Temp:  98 ?F (36.7 ?C)  ?SpO2: 95% 98%  ? ? ?No intake or output data in the 24 hours ending 12/22/21 0829 ? ?General:  Well appearing. No respiratory  difficulty ?HEENT: normal ?Neck: supple. no JVD. Carotids 2+ bilat; no bruits. No lymphadenopathy or thryomegaly appreciated. ?Cor: PMI nondisplaced. Regular rate & rhythm. No rubs, gallops or murmurs. ?Lungs: clear ?Abdomen: soft, nontender, nondistended. No hepatosplenomegaly. No  bruits or masses. Good bowel sounds. ?Extremities: no cyanosis, clubbing, rash, edema ?Neuro: alert & oriented x 3, cranial nerves grossly intact. moves all 4 extremities w/o difficulty. Affect pleasant. ? ?ECG: Normal sinus rhythm low voltage nonspecific ST-T changes ? ?Results for orders placed or performed during the hospital encounter of 12/21/21 (from the past 24 hour(s))  ?CBC with Differential     Status: Abnormal  ? Collection Time: 12/21/21  5:06 PM  ?Result Value Ref Range  ? WBC 6.9 4.0 - 10.5 K/uL  ? RBC 5.15 (H) 3.87 - 5.11 MIL/uL  ? Hemoglobin 15.3 (H) 12.0 - 15.0 g/dL  ? HCT 48.7 (H) 36.0 - 46.0 %  ? MCV 94.6 80.0 - 100.0 fL  ? MCH 29.7 26.0 - 34.0 pg  ? MCHC 31.4 30.0 - 36.0 g/dL  ? RDW 13.9 11.5 - 15.5 %  ? Platelets 124 (L) 150 - 400 K/uL  ? nRBC 0.0 0.0 - 0.2 %  ? Neutrophils Relative % 74 %  ? Neutro Abs 5.2 1.7 - 7.7 K/uL  ? Lymphocytes Relative 18 %  ? Lymphs Abs 1.2 0.7 - 4.0 K/uL  ? Monocytes Relative 7 %  ? Monocytes Absolute 0.5 0.1 - 1.0 K/uL  ? Eosinophils Relative 0 %  ? Eosinophils Absolute 0.0 0.0 - 0.5 K/uL  ? Basophils Relative 0 %  ? Basophils Absolute 0.0 0.0 - 0.1 K/uL  ? Immature Granulocytes 1 %  ? Abs Immature Granulocytes 0.04 0.00 - 0.07 K/uL  ?Troponin I (High Sensitivity)     Status: None  ? Collection Time: 12/21/21  5:06 PM  ?Result Value Ref Range  ? Troponin I (High Sensitivity) 6 <18 ng/L  ?Urinalysis, Routine w reflex microscopic Urine, Clean Catch     Status: Abnormal  ? Collection Time: 12/21/21  5:06 PM  ?Result Value Ref Range  ? Color, Urine STRAW (A) YELLOW  ? APPearance CLEAR (A) CLEAR  ? Specific Gravity, Urine 1.003 (L) 1.005 - 1.030  ? pH 7.0 5.0 - 8.0  ? Glucose, UA NEGATIVE  NEGATIVE mg/dL  ? Hgb urine dipstick NEGATIVE NEGATIVE  ? Bilirubin Urine NEGATIVE NEGATIVE  ? Ketones, ur NEGATIVE NEGATIVE mg/dL  ? Protein, ur NEGATIVE NEGATIVE mg/dL  ? Nitrite NEGATIVE NEGATIVE  ? Leukocytes,Ua NEGATIVE NEGATIVE  ?

## 2021-12-22 NOTE — ED Notes (Signed)
Pt helped to bedside commode. Pt tolerated well.  ?

## 2021-12-22 NOTE — Assessment & Plan Note (Signed)
ekg shows sinus brady, but lot of artifact. ?We will repeat ekg a d admit to cardiac tele.  ? ?

## 2021-12-22 NOTE — Discharge Summary (Signed)
Physician Discharge Summary  ?Claire Mcdonald EVO:[id number] DOB: 1965/08/27 DOA: 12/21/2021 ? ?PCP: [name], MD ? ?Admit date: 12/21/2021 ?Discharge date: 12/22/2021 ? ?Admitted From: Home ?Disposition:  Home ? ?Recommendations for Outpatient Follow-up:  ?Follow up with PCP in 1-2 weeks. ?Please obtain BMP/CBC in one week. ?Advised to continue current medications as prescribed. ?Advised to take Imdur 30 mg daily. ?Advised to follow-up with cardiology as scheduled. ? ?Home Health: None ?Equipment/Devices: None ? ?Discharge Condition: Stable ?CODE STATUS:Full code ?Diet recommendation: Heart Healthy  ? ?Brief Summary/ Hospital course: ?This 56 years old female with PMH significant for seizure disorder, history of stroke with right-sided weakness, presented with complaints of chest pain and abdominal pain.  Patient reports having chest pain started 2 days ago,  describes it is intermittent on the left side of chest , 10 /10 on pain scale radiating towards her AICD device.  She follows up with cardiologist in [place].  She also describes having abdominal pain.  Work-up in the ED is unremarkable.  Troponin x 3 negative.  EKG unremarkable.  CT abdomen and pelvis no abnormality found.  Patient was seen by cardiology recommended chest pain seems noncardiac in origin.  Recommended patient can follow-up with cardiology outpatient and can be discharged.  Patient feels better and want to be discharged.  Patient is started on Imdur 30 mg daily.  Patient is being discharged home. ? ?Discharge Diagnoses:  ?Principal Problem: ?  Chest pain ?Active Problems: ?  CAD (coronary artery disease) ?  Abdominal pain ?  Essential hypertension ?  Seizure disorder ([place]) ?  Lymphedema of both lower extremities ?  Chronic anticoagulation (Xarelto) ? ?* Chest pain ?Patient presenting with chest pain,  troponins negative. ?Chest pain appears noncardiac in origin.  Troponin negative. EKG unremarkable. ?Patient was seen by cardiology recommended  patient can be discharged and outpatient follow-up. ?  ?CAD (coronary artery disease) ?Ekg shows sinus brady, but lot of artifact. ?Cardiology recommended to start Imdur 30 mg daily. ?  ?  ?Abdominal pain > improving ?Pt also reports abd pain that has been going on for past few weeks  ?CT abdomen pelvis unremarkable. ?  ?Essential hypertension ?Continue home blood pressure medications ? ?  ?Seizure disorder ([place]) ?Continue Topamax and Neurontin  ?Reports no seizure for a long time now. ?Aspiration fall and seizure precautions. ?  ?Lymphedema of both lower extremities ?Patient is significant swelling in both her legs has been willing to use lymphedema clinic. ?She may [facility] to [facility] with [facility] . ?  ?  ?Discharge Instructions ? ?Discharge Instructions   ? ? Call MD for:  difficulty breathing, headache or visual disturbances   Complete by: As directed ?  ? Call MD for:  persistant dizziness or light-headedness   Complete by: As directed ?  ? Call MD for:  persistant nausea and vomiting   Complete by: As directed ?  ? Diet - low sodium heart healthy   Complete by: As directed ?  ? Diet Carb Modified   Complete by: As directed ?  ? Discharge instructions   Complete by: As directed ?  ? Advised to follow-up with primary care physician in 1 week. ?Advised to continue current medications as prescribed. ?Advised to take Imdur 30 mg daily. ?Advised to follow-up with cardiology as scheduled.  ? Increase activity slowly   Complete by: As directed ?  ? ?  ? ?Allergies as of 12/22/2021   ? ?   Reactions  ? Tape Other (See  Comments)  ? likely allergy to adhesive/ dermabond skin glue - blisters noted at edge of adhesives ?likely allergy to adhesive/ dermabond skin glue - blisters noted at edge of adhesives  ? Iodinated Contrast Media Rash  ? On face   ? Toradol [ketorolac Tromethamine] Itching, Rash  ? Tramadol Itching, Rash  ? Zofran [ondansetron Hcl] Itching, Rash  ? ?  ? ?  ?Medication List  ?  ? ?STOP taking these medications    ? ?cetirizine 10 MG tablet ?Commonly known as: ZYRTEC ?  ?dicyclomine 20 MG tablet ?Commonly known as: BENTYL ?  ?fluticasone 110 MCG/ACT inhaler ?Commonly known as: FLOVENT HFA ?  ?hydrOXYzine 25 MG tablet ?Commonly known as: ATARAX ?  ?promethazine 12.5 MG tablet ?Commonly known as: PHENERGAN ?  ? ?  ? ?TAKE these medications   ? ?acetaminophen 500 MG tablet ?Commonly known as: TYLENOL ?Take by mouth. ?  ?albuterol 108 (90 Base) MCG/ACT inhaler ?Commonly known as: VENTOLIN HFA ?Inhale 2 puffs into the lungs every 6 (six) hours as needed for wheezing or shortness of breath. ?  ?atorvastatin 20 MG tablet ?Commonly known as: LIPITOR ?Take 1 tablet (20 mg total) by mouth daily at 6 PM. ?What changed: how much to take ?  ?azelastine 0.1 % nasal spray ?Commonly known as: ASTELIN ?1 spray by Each Nare route Two (2) times a day. Use in each nostril as directed ?  ?famotidine 20 MG tablet ?Commonly known as: PEPCID ?Take 1 tablet (20 mg total) by mouth 2 (two) times daily. ?  ?furosemide 20 MG tablet ?Commonly known as: LASIX ?Take 20 mg by mouth daily and may take an additional daily as directed ?  ?gabapentin 300 MG capsule ?Commonly known as: NEURONTIN ?Take 300 mg by mouth 3 (three) times daily. ?  ?GNP Calcium 1200 1200-1000 MG-UNIT Chew ?Chew 1,200 mg by mouth daily with breakfast. Take in combination with vitamin D and magnesium. ?  ?isosorbide mononitrate 30 MG 24 hr tablet ?Commonly known as: IMDUR ?Take 0.5 tablets (15 mg total) by mouth daily. ?Start taking on: Dec 23, 2021 ?  ?ketoconazole 2 % cream ?Commonly known as: NIZORAL ?Apply 1 application. topically 2 (two) times daily. ?  ?lisinopril 10 MG tablet ?Commonly known as: ZESTRIL ?Take 10 mg by mouth daily. ?  ?Magnesium 400 MG Tabs ?Take 400 mg by mouth daily. ?  ?metoprolol tartrate 25 MG tablet ?Commonly known as: LOPRESSOR ?Take 0.5 tablets (12.5 mg total) by mouth 2 (two) times daily. ?What changed: how much to take ?  ?polyethylene glycol 17 g  packet ?Commonly known as: MIRALAX / GLYCOLAX ?Take 17 g by mouth daily. ?  ?rivaroxaban 20 MG Tabs tablet ?Commonly known as: XARELTO ?Take 20 mg by mouth daily with supper. ?  ?senna-docusate 8.6-50 MG tablet ?Commonly known as: Senokot-S ?Take 1 tablet by mouth 2 (two) times daily. ?  ?sertraline 100 MG tablet ?Commonly known as: ZOLOFT ?TAKE (1) TABLET BY MOUTH EVERY DAY ?  ?topiramate 100 MG tablet ?Commonly known as: TOPAMAX ?Take 1 tablet (100 mg total) by mouth 2 (two) times daily. ?  ?triamcinolone 0.025 % ointment ?Commonly known as: KENALOG ?APPLY TOPICALLY TWICE DAILY AS NEEDED FOR ITCH. YOU CAN ALSO MIX WITHAQUAPHOROR VASELINE IN A 1:1 RATIO. AVOID FACE AND SKIN FOLDS ?  ?Vitamin D3 125 MCG (5000 UT) Tabs ?Take by mouth. ?  ? ?  ? ? Follow-up Information   ? ? [name], MD Follow up in 1 week(s).   ?Specialties:  Family Medicine, Sports Medicine ?Contact information: ?[address] [address] ?[phone] ? ? ?  ?  ? ? Dionisio David, MD Follow up in 1 week(s).   ?Specialty: Cardiology ?Contact information: ?[address] ?[place] [address] ?[phone] ? ? ?  ?  ? ?  ?  ? ?  ? ?Allergies  ?Allergen Reactions  ? Tape Other (See Comments)  ?  likely allergy to adhesive/ dermabond skin glue - blisters noted at edge of adhesives ?likely allergy to adhesive/ dermabond skin glue - blisters noted at edge of adhesives ?  ? Iodinated Contrast Media Rash  ?  On face   ? Toradol [Ketorolac Tromethamine] Itching and Rash  ? Tramadol Itching and Rash  ? Zofran [Ondansetron Hcl] Itching and Rash  ? ? ?Consultations: ?Cardiology ? ? ?Procedures/Studies: ?CT ABDOMEN PELVIS WO CONTRAST ? ?Result Date: 12/22/2021 ?CLINICAL DATA:  Abdominal pain EXAM: CT ABDOMEN AND PELVIS WITHOUT CONTRAST TECHNIQUE: Multidetector CT imaging of the abdomen and pelvis was performed following the standard protocol without IV contrast. RADIATION DOSE REDUCTION: This exam was performed according to the departmental  dose-optimization program which includes automated exposure control, adjustment of the mA and/or kV according to patient size and/or use of iterative reconstruction technique. COMPARISON:  02/19/2017 FINDINGS: Low

## 2021-12-22 NOTE — Assessment & Plan Note (Signed)
Pt also reports abd pain that has been going on for past few weeks and ct ordered and pending.  ? ?

## 2021-12-22 NOTE — Progress Notes (Signed)
Patient transport off floor via wheelchair, discharged home ?

## 2021-12-22 NOTE — Progress Notes (Signed)
Claire Mcdonald to be D/C'd Home per MD order.  Discussed prescriptions and follow up appointments with the patient. Prescriptions electronically submitted, medication list explained in detail. Pt verbalized understanding. ? ?Allergies as of 12/22/2021   ? ?   Reactions  ? Tape Other (See Comments)  ? likely allergy to adhesive/ dermabond skin glue - blisters noted at edge of adhesives ?likely allergy to adhesive/ dermabond skin glue - blisters noted at edge of adhesives  ? Iodinated Contrast Media Rash  ? On face   ? Toradol [ketorolac Tromethamine] Itching, Rash  ? Tramadol Itching, Rash  ? Zofran [ondansetron Hcl] Itching, Rash  ? ?  ? ?  ?Medication List  ?  ? ?STOP taking these medications   ? ?cetirizine 10 MG tablet ?Commonly known as: ZYRTEC ?  ?dicyclomine 20 MG tablet ?Commonly known as: BENTYL ?  ?fluticasone 110 MCG/ACT inhaler ?Commonly known as: FLOVENT HFA ?  ?hydrOXYzine 25 MG tablet ?Commonly known as: ATARAX ?  ?promethazine 12.5 MG tablet ?Commonly known as: PHENERGAN ?  ? ?  ? ?TAKE these medications   ? ?acetaminophen 500 MG tablet ?Commonly known as: TYLENOL ?Take by mouth. ?  ?albuterol 108 (90 Base) MCG/ACT inhaler ?Commonly known as: VENTOLIN HFA ?Inhale 2 puffs into the lungs every 6 (six) hours as needed for wheezing or shortness of breath. ?  ?atorvastatin 20 MG tablet ?Commonly known as: LIPITOR ?Take 1 tablet (20 mg total) by mouth daily at 6 PM. ?What changed: how much to take ?  ?azelastine 0.1 % nasal spray ?Commonly known as: ASTELIN ?1 spray by Each Nare route Two (2) times a day. Use in each nostril as directed ?  ?famotidine 20 MG tablet ?Commonly known as: PEPCID ?Take 1 tablet (20 mg total) by mouth 2 (two) times daily. ?  ?furosemide 20 MG tablet ?Commonly known as: LASIX ?Take 20 mg by mouth daily and may take an additional daily as directed ?  ?gabapentin 300 MG capsule ?Commonly known as: NEURONTIN ?Take 300 mg by mouth 3 (three) times daily. ?  ?GNP Calcium 1200 1200-1000  MG-UNIT Chew ?Chew 1,200 mg by mouth daily with breakfast. Take in combination with vitamin D and magnesium. ?  ?isosorbide mononitrate 30 MG 24 hr tablet ?Commonly known as: IMDUR ?Take 0.5 tablets (15 mg total) by mouth daily. ?Start taking on: Dec 23, 2021 ?  ?ketoconazole 2 % cream ?Commonly known as: NIZORAL ?Apply 1 application. topically 2 (two) times daily. ?  ?lisinopril 10 MG tablet ?Commonly known as: ZESTRIL ?Take 10 mg by mouth daily. ?  ?Magnesium 400 MG Tabs ?Take 400 mg by mouth daily. ?  ?metoprolol tartrate 25 MG tablet ?Commonly known as: LOPRESSOR ?Take 0.5 tablets (12.5 mg total) by mouth 2 (two) times daily. ?What changed: how much to take ?  ?polyethylene glycol 17 g packet ?Commonly known as: MIRALAX / GLYCOLAX ?Take 17 g by mouth daily. ?  ?rivaroxaban 20 MG Tabs tablet ?Commonly known as: XARELTO ?Take 20 mg by mouth daily with supper. ?  ?senna-docusate 8.6-50 MG tablet ?Commonly known as: Senokot-S ?Take 1 tablet by mouth 2 (two) times daily. ?  ?sertraline 100 MG tablet ?Commonly known as: ZOLOFT ?TAKE (1) TABLET BY MOUTH EVERY DAY ?  ?topiramate 100 MG tablet ?Commonly known as: TOPAMAX ?Take 1 tablet (100 mg total) by mouth 2 (two) times daily. ?  ?triamcinolone 0.025 % ointment ?Commonly known as: KENALOG ?APPLY TOPICALLY TWICE DAILY AS NEEDED FOR ITCH. YOU CAN ALSO MIX [id number]  VASELINE IN A 1:1 RATIO. AVOID FACE AND SKIN FOLDS ?  ?Vitamin D3 125 MCG (5000 UT) Tabs ?Take by mouth. ?  ? ?  ? ? ?Vitals:  ? 12/22/21 0719 12/22/21 1024  ?BP: (!) 146/93 111/78  ?Pulse: (!) 56   ?Resp: 16   ?Temp: 98 ?F (36.7 ?C)   ?SpO2: 98%   ? ? ?Skin clean, dry and intact without evidence of skin break down, no evidence of skin tears noted. IV catheter discontinued intact. Site without signs and symptoms of complications. Dressing and pressure applied. Pt denies pain at this time. No complaints noted. ? ?An After Visit Summary was printed and given to the patient. ?Patient waiting on ride to be  discharged home ? ?[place]  ?

## 2021-12-22 NOTE — Assessment & Plan Note (Signed)
Patient states Chantix Topamax and Neurontin has not had a seizure for a long time now. ?Aspiration fall and seizure precautions. ?

## 2021-12-29 NOTE — Progress Notes (Signed)
Patient: Claire Mcdonald  Service Category: E/M  Provider: [name], MD  ?DOB: 29-Oct-1965  DOS: 01/02/2022  Location: Office  ?MRN: [id number]  Setting: Ambulatory outpatient  Referring Provider: [name], MD  ?Type: Established Patient  Specialty: Interventional Pain Management  PCP: [name], MD  ?Location: Remote location  Delivery: TeleHealth    ? ?Virtual Encounter - Pain Management ?PROVIDER NOTE: Information contained herein reflects review and annotations entered in association with encounter. Interpretation of such information and data should be left to medically-trained personnel. Information provided to patient can be located elsewhere in the medical record under "Patient Instructions". Document created using STT-dictation technology, any transcriptional errors that may result from process are unintentional.  ?  ?Contact & Pharmacy ?Preferred: [phone] ?Home: [phone] (home) ?Mobile: [phone] (mobile) ?E-mail: [email or web address]  ?[address] ?[place] [address] ?Phone: [phone] Fax: [phone] ? ?[facility] - [place], [place] - [id number] ?[address] ?[place] [address] ?Phone: [phone] Fax: [phone] ?  ?Pre-screening  ?Claire Mcdonald offered "in-person" vs "virtual" encounter. She indicated preferring virtual for this encounter.  ? ?Reason ?COVID-19*  Social distancing based on CDC and AMA recommendations.  ? ?I contacted Claire Mcdonald on 01/02/2022 via telephone.      I clearly identified myself as [name], MD. I verified that I was speaking with the correct person using two identifiers (Name: Claire Mcdonald, and date of birth: 1965/08/27). ? ?Consent ?I sought verbal advanced consent from Claire Mcdonald for virtual visit interactions. I informed Claire Mcdonald of possible security and privacy concerns, risks, and limitations associated with providing "not-in-person" medical  evaluation and management services. I also informed Claire Mcdonald of the availability of "in-person" appointments. Finally, I informed her that there would be a charge for the virtual visit and that she could be  personally, fully or partially, financially responsible for it. Claire Mcdonald expressed understanding and agreed to proceed.  ? ?Historic Elements   ?Claire Mcdonald is a 56 y.o. year old, female patient evaluated today after our last contact on 12/19/2021. 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 She also  has a past surgical history that includes Tubal ligation (1998); Ovarian cyst removal (Left); central line (03/25/2015); Hand surgery (Left); Cardiac catheterization (N/A, 03/27/2015); Cardiac catheterization (N/A, 04/11/2015); Cardiac catheterization (N/A, 07/16/2015); Colonoscopy with propofol (N/A, 10/01/2015); Esophagogastroduodenoscopy (egd) with propofol (N/A, 10/01/2015); Abdominal hysterectomy; and Hip Arthroplasty (Right, 02/27/2020). Claire Mcdonald has a current medication list which includes the following prescription(s): acetaminophen, albuterol, atorvastatin, azelastine, gnp calcium 1200, vitamin d3, famotidine, furosemide, gabapentin, isosorbide mononitrate, ketoconazole, lisinopril, magnesium, metoprolol tartrate, polyethylene glycol, rivaroxaban, senna-docusate, sertraline, topiramate, and triamcinolone. She  reports that she has never smoked. She has never used smokeless tobacco. She reports that she does not drink alcohol and does not use drugs. Claire Mcdonald is allergic to  tape, iodinated contrast media, toradol [ketorolac tromethamine], tramadol, and zofran [ondansetron hcl].  ? ?HPI  ?Today, she is being contacted for a post-procedure assessment.  The patient indicates having an ongoing 100% relief of the lower back pain from the  lumbar facet blocks. ? ?Post-procedure evaluation  ? Type: Lumbar Facet, Medial Branch Block(s)  #R9L8   ?Laterality: Bilateral  ?Level: L2, L3, L4, L5, & S1 Medial Branch Level(s). Injecting these levels blocks the L3-4 and L5-S1 lumbar facet joints. ?Imaging: Fluoroscopic guidance ?Anesthesia: Local anesthesia (1-2% Lidocaine) ?Anxiolysis: IV Versed 2.0 mg ?Sedation: None. ?DOS: 12/19/2021 ?Performed by: [name], MD ? ?Primary Purpose: Diagnostic/Therapeutic ?Indications: Low back pain severe enough to impact quality of life or function. ?1. Lumbar facet syndrome (Bilateral) (R>L)   ?2. Chronic low back pain (Bilateral) w/o sciatica   ?3. Lumbar facet arthropathy   ?4. Spondylosis without myelopathy or radiculopathy, lumbosacral region   ?5. Pars defect of lumbar spine   ?6. Grade 2-3 (13-16 mm) Anterolisthesis of L5 over S1 (06/22/2019)   ?7. 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 Chronic anticoagulation (Xarelto)   ?9. History of allergy to radiographic contrast media   ?10. History of Allergy to iodine   ? ?NAS-11 Pain score:  ? Pre-procedure: 9 /10  ? Post-procedure: 9 /10  ? ?   ?Effectiveness:  ?Initial hour after procedure:   100%. ?Subsequent 4-6 hours post-procedure:   100%. ?Analgesia past initial 6 hours:   100%. ?Ongoing improvement:  ?Analgesic: The patient indicates having an ongoing 100% relief of the low back pain, bilaterally. ?Function: Claire Mcdonald reports improvement in function ?ROM: Claire Mcdonald reports improvement in ROM ? ?Pharmacotherapy Assessment  ? ?Opioid Analgesic: No opioid analgesics prescribed by our practice.  The patient received a letter from [facility] indicating that they would not be  paying for her oxycodone prescriptions.  ? ?Monitoring: ?[place] PMP: PDMP reviewed during this encounter.       ?Pharmacotherapy: No side-effects or adverse reactions reported. ?Compliance: No problems identified. ?Effectiveness: Clinically acceptable. ?Plan: Refer to "POC". UDS: No results found for: SUMMARY  ? ?Laboratory Chemistry Profile  ? ?Renal ?Lab Results  ?Component Value Date  ? BUN 15 12/21/2021  ? CREATININE 0.59 12/21/2021  ? BCR 21 04/16/2015  ? GFRAA >60 03/01/2020  ? GFRNONAA >60 12/21/2021  ?  Hepatic ?Lab Results  ?Component Value Date  ? AST 20 12/21/2021  ? ALT 7 12/21/2021  ? ALBUMIN 3.8 12/21/2021  ? ALKPHOS 54 12/21/2021  ? LIPASE 26 12/21/2021  ?  ?Electrolytes ?Lab Results  ?Component Value Date  ? NA 139 12/21/2021  ? K 4.1 12/21/2021  ? CL 106 12/21/2021  ? CALCIUM 9.2 12/21/2021  ? MG 2.3 12/21/2021  ? PHOS 3.8 02/19/2017  ?  Bone ?Lab Results  ?Component Value Date  ? VD25OH 19.3 (L) 11/30/2017  ?  ?Inflammation (CRP: Acute Phase) (ESR: Chronic Phase) ?Lab Results  ?Component Value Date  ? CRP <0.8 11/30/2017  ? ESRSEDRATE 19 11/30/2017  ? LATICACIDVEN 0.8 03/26/2015  ?    ?  ? ?Note: Above Lab results reviewed. ? ?Imaging  ?ECHOCARDIOGRAM COMPLETE ?   ECHOCARDIOGRAM REPORT   ? ?  ? ?Patient Name:   ELLOISE ROARK Date of Exam: 12/22/2021 ?Medical Rec #:  [id number]   Height:       61.0 in ?Accession #:    [id number]  Weight:       160.0 lb ?Date of Birth:  05/18/1966    BSA:          1.718 m? ?Patient Age:    67 years    BP:           106/77 mmHg ?Patient Gender: F  HR:           70 bpm. ?Exam Location:  [place] [facility] ? ?Procedure: 2D Echo ? ?Indications:     Chest Pain ?  ?History:         Patient has prior history of Echocardiogram examinations. ?                 Cardiomyopathy and Hx of Cardiac Arrest; Defibrillator. ?  ?Sonographer:     L Thornton-Maynard ?Referring Phys:  [place] ?Diagnosing Phys: Kate Sable MD ? ?IMPRESSIONS ? ? 1. Left ventricular ejection fraction, by  estimation, is 55 to 60%. The left ventricle has normal function. The left ventricle has no regional wall motion abnormalities. Left ventricular diastolic parameters were normal. ? 2. Right ventricular systolic function

## 2022-01-02 ENCOUNTER — Ambulatory Visit: Payer: Medicaid Other | Attending: Pain Medicine | Admitting: Pain Medicine

## 2022-01-02 DIAGNOSIS — M961 Postlaminectomy syndrome, not elsewhere classified: Secondary | ICD-10-CM | POA: Diagnosis not present

## 2022-01-02 DIAGNOSIS — G8929 Other chronic pain: Secondary | ICD-10-CM | POA: Diagnosis not present

## 2022-01-02 DIAGNOSIS — M545 Low back pain, unspecified: Secondary | ICD-10-CM | POA: Diagnosis not present

## 2022-01-02 DIAGNOSIS — M47816 Spondylosis without myelopathy or radiculopathy, lumbar region: Secondary | ICD-10-CM

## 2022-01-16 DIAGNOSIS — K4020 Bilateral inguinal hernia, without obstruction or gangrene, not specified as recurrent: Secondary | ICD-10-CM | POA: Insufficient documentation

## 2022-02-10 DIAGNOSIS — M24549 Contracture, unspecified hand: Secondary | ICD-10-CM | POA: Insufficient documentation

## 2022-02-10 DIAGNOSIS — R29898 Other symptoms and signs involving the musculoskeletal system: Secondary | ICD-10-CM | POA: Insufficient documentation

## 2022-04-06 NOTE — Progress Notes (Signed)
Patient: Claire Mcdonald  Service Category: E/M  Provider: [name], MD  DOB: 1966-05-19  DOS: 04/07/2022  Location: Office  MRN: [id number]  Setting: Ambulatory outpatient  Referring Provider: [name], MD  Type: Established Patient  Specialty: Interventional Pain Management  PCP: [name], MD  Location: Remote location  Delivery: TeleHealth     Virtual Encounter - Pain Management PROVIDER NOTE: Information contained herein reflects review and annotations entered in association with encounter. Interpretation of such information and data should be left to medically-trained personnel. Information provided to patient can be located elsewhere in the medical record under "Patient Instructions". Document created using STT-dictation technology, any transcriptional errors that may result from process are unintentional.    Contact & Pharmacy Preferred: [phone] Home: [phone] (home) Mobile: [phone] (mobile) E-mail: [name] .[place] [place] [address] [place] [address] Phone: [phone] Fax: [phone]  [facility] - [place], [place] - [address] [place] [address] Phone: [phone] Fax: [phone]   Pre-screening  Ms. Schmiesing offered "in-person" vs "virtual" encounter. She indicated preferring virtual for this encounter.   Reason COVID-19*  Social distancing based on CDC and AMA recommendations.   I contacted JALIZA SEIFRIED on 04/07/2022 via telephone.      I clearly identified myself as [name], MD. I verified that I was speaking with the correct person using two identifiers (Name: AVERY EUSTICE, and date of birth: July 14, 1966).  Consent I sought verbal advanced consent from [name] for virtual visit interactions. I informed Ms. Mossa of possible security and privacy concerns, risks, and limitations associated with providing "not-in-person" medical  evaluation and management services. I also informed Ms. Borchardt of the availability of "in-person" appointments. Finally, I informed her that there would be a charge for the virtual visit and that she could be  personally, fully or partially, financially responsible for it. Ms. Encalada expressed understanding and agreed to proceed.   Historic Elements   Ms. KATELAN HIRT is a 56 y.o. year old, female patient evaluated today after our last contact on 01/02/2022. Ms. 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 She also  has a past surgical history that includes Tubal ligation (1998); Ovarian cyst removal (Left); central line (03/25/2015); Hand surgery (Left); Cardiac catheterization (N/A, 03/27/2015); Cardiac catheterization (N/A, 04/11/2015); Cardiac catheterization (N/A, 07/16/2015); Colonoscopy with propofol (N/A, 10/01/2015); Esophagogastroduodenoscopy (egd) with propofol (N/A, 10/01/2015); Abdominal hysterectomy; and Hip Arthroplasty (Right, 02/27/2020). Ms. Bossler has a current medication list which includes the following prescription(s): acetaminophen, albuterol, atorvastatin, azelastine, gnp calcium 1200, vitamin d3, famotidine, furosemide, gabapentin, isosorbide mononitrate, ketoconazole, lisinopril, magnesium, metoprolol tartrate, polyethylene glycol, rivaroxaban, senna-docusate, sertraline, topiramate, and triamcinolone. She  reports that she has never smoked. She has never used smokeless tobacco. She reports that she does not drink alcohol and does not use drugs. Ms. Woodring is allergic to  tape, iodinated contrast media, toradol [ketorolac tromethamine], tramadol, and zofran [ondansetron hcl].   HPI  Today, she is being contacted for  the patient indicates having a recurrence of her low back pain.  Currently she describes having some discomfort  going down the right lower extremity.  She indicates that the pain to be worse in the lower back when compared to the lower extremity.  In the case of the lower back, the pain is bilateral (R>L).  In the case of the lower extremity the pain is on the right leg and runs through the back of the leg to the area of the knee.  No pain, numbness or weakness in the leg.  No radicular signs or symptoms.  This pain in the lower extremity tends to be intermittent and it goes away with lumbar facet blocks.  The patient had this pain last treated on 12/19/2021 at which time she had a bilateral lumbar facet block which provided her with 100% relief of the pain for the duration of the local anesthetic followed by an ongoing 100% relief of the low back and lower extremity symptoms by the time that she had her follow-up evaluation on 01/02/2022.  The patient has a prior laminectomy and fusion with posterior hardware and therefore she is not a good candidate for radiofrequency ablation but she does respond extremely well to the palliative lumbar facet blocks.  The last one was done on 12/19/2021 and she is calling today to have this repeated indicating that it lasted close to 4 months.  The patient is requesting to have this repeated under sedation.  Pharmacotherapy Assessment   Opioid Analgesic: No opioid analgesics prescribed by our practice.  The patient received a letter from [facility] indicating that they would not be paying for her oxycodone prescriptions.   Monitoring: [place] PMP: PDMP reviewed during this encounter.       Pharmacotherapy: No side-effects or adverse reactions reported. Compliance: No problems identified. Effectiveness: Clinically acceptable. Plan:  Refer to "POC". UDS: No results found for: "SUMMARY" No results found for: "CBDTHCR", "D8THCCBX", "D9THCCBX"   Laboratory Chemistry Profile   Renal Lab Results  Component Value Date   BUN 15 12/21/2021   CREATININE 0.59 12/21/2021   BCR 21 04/16/2015   GFRAA >60 03/01/2020   GFRNONAA >60 12/21/2021    Hepatic Lab Results  Component Value Date   AST 20 12/21/2021   ALT 7 12/21/2021   ALBUMIN 3.8 12/21/2021   ALKPHOS 54 12/21/2021   LIPASE 26 12/21/2021    Electrolytes Lab Results  Component Value Date   NA 139 12/21/2021   K 4.1 12/21/2021   CL 106 12/21/2021   CALCIUM 9.2 12/21/2021   MG 2.3 12/21/2021   PHOS 3.8 02/19/2017    Bone Lab Results  Component Value Date   VD25OH 19.3 (L) 11/30/2017    Inflammation (CRP: Acute Phase) (ESR: Chronic Phase) Lab Results  Component Value Date   CRP <0.8 11/30/2017   ESRSEDRATE 19 11/30/2017   LATICACIDVEN 0.8 03/26/2015         Note: Above Lab results reviewed.  Imaging  ECHOCARDIOGRAM COMPLETE    ECHOCARDIOGRAM REPORT       Patient Name:   JODINE MUCHMORE Date of Exam: 12/22/2021 Medical Rec #:  [id number]   Height:       61.0 in Accession #:    [id number]  Weight:       160.0 lb Date of Birth:  [birth year]    BSA:          1.718 m Patient Age:    47 years    BP:           106/77 mmHg Patient Gender: F  HR:           70 bpm. Exam Location:  [facility]  Procedure: 2D Echo  Indications:     Chest Pain   History:         Patient has prior history of Echocardiogram examinations.                  Cardiomyopathy and Hx of Cardiac Arrest; Defibrillator.   Sonographer:     L Thornton-Maynard Referring Phys:  [id number] Q PATEL Diagnosing Phys: Kate Sable MD  IMPRESSIONS   1. Left ventricular ejection fraction, by estimation, is 55 to 60%. The left ventricle has normal function. The left ventricle has no regional wall motion abnormalities. Left ventricular diastolic parameters were normal.  2. Right  ventricular systolic function is normal. The right ventricular size is moderately enlarged. There is normal pulmonary artery systolic pressure.  3. The mitral valve is normal in structure. No evidence of mitral valve regurgitation.  4. The aortic valve is tricuspid. Aortic valve regurgitation is not visualized.  5. The inferior vena cava is dilated in size with >50% respiratory variability, suggesting right atrial pressure of 8 mmHg.  FINDINGS  Left Ventricle: Left ventricular ejection fraction, by estimation, is 55 to 60%. The left ventricle has normal function. The left ventricle has no regional wall motion abnormalities. The left ventricular internal cavity size was normal in size. There is  no left ventricular hypertrophy. Left ventricular diastolic parameters were normal.  Right Ventricle: The right ventricular size is moderately enlarged. No increase in right ventricular wall thickness. Right ventricular systolic function is normal. There is normal pulmonary artery systolic pressure. The tricuspid regurgitant velocity is  2.39 m/s, and with an assumed right atrial pressure of 3 mmHg, the estimated right ventricular systolic pressure is 30.0 mmHg.  Left Atrium: Left atrial size was normal in size.  Right Atrium: Right atrial size was normal in size.  Pericardium: There is no evidence of pericardial effusion.  Mitral Valve: The mitral valve is normal in structure. No evidence of mitral valve regurgitation.  Tricuspid Valve: The tricuspid valve is normal in structure. Tricuspid valve regurgitation is mild.  Aortic Valve: The aortic valve is tricuspid. Aortic valve regurgitation is not visualized. Aortic valve mean gradient measures 4.0 mmHg. Aortic valve peak gradient measures 6.9 mmHg. Aortic valve area, by VTI measures 1.85 cm.  Pulmonic Valve: The pulmonic valve was not assessed. Pulmonic valve regurgitation is not visualized.  Aorta: The aortic root is normal in size and  structure.  Venous: The inferior vena cava is dilated in size with greater than 50% respiratory variability, suggesting right atrial pressure of 8 mmHg.  IAS/Shunts: No atrial level shunt detected by color flow Doppler.  Additional Comments: A device lead is visualized.    LEFT VENTRICLE PLAX 2D LVIDd:         3.95 cm     Diastology LVIDs:         2.78 cm     LV e' medial:    6.40 cm/s LV PW:         0.86 cm     LV E/e' medial:  8.7 LV IVS:        0.82 cm     LV e' lateral:   9.79 cm/s LVOT diam:     1.90 cm     LV E/e' lateral: 5.7 LV SV:         48 LV SV Index:   28 LVOT Area:  2.84 cm   LV Volumes (MOD) LV vol d, MOD A2C: 24.5 ml LV vol d, MOD A4C: 45.1 ml LV vol s, MOD A2C: 10.3 ml LV vol s, MOD A4C: 18.5 ml LV SV MOD A2C:     14.2 ml LV SV MOD A4C:     45.1 ml LV SV MOD BP:      22.5 ml  RIGHT VENTRICLE RV S prime:     14.60 cm/s TAPSE (M-mode): 1.7 cm  LEFT ATRIUM           Index        RIGHT ATRIUM           Index LA diam:      3.10 cm 1.80 cm/m   RA Area:     18.80 cm LA Vol (A2C): 20.4 ml 11.88 ml/m  RA Volume:   51.30 ml  29.86 ml/m LA Vol (A4C): 34.2 ml 19.91 ml/m  AORTIC VALVE                    PULMONIC VALVE AV Area (Vmax):    1.81 cm     PV Vmax:          0.80 m/s AV Area (Vmean):   1.90 cm     PV Peak grad:     2.6 mmHg AV Area (VTI):     1.85 cm     PR End Diast Vel: 1.25 msec AV Vmax:           131.00 cm/s AV Vmean:          87.300 cm/s AV VTI:            0.259 m AV Peak Grad:      6.9 mmHg AV Mean Grad:      4.0 mmHg LVOT Vmax:         83.60 cm/s LVOT Vmean:        58.600 cm/s LVOT VTI:          0.169 m LVOT/AV VTI ratio: 0.65   AORTA Ao Root diam: 3.30 cm Ao Asc diam:  3.20 cm  MITRAL VALVE               TRICUSPID VALVE MV Area (PHT): 2.86 cm    TR Peak grad:   22.8 mmHg MV E velocity: 55.70 cm/s  TR Vmax:        239.00 cm/s MV A velocity: 59.10 cm/s MV E/A ratio:  0.94        SHUNTS                            Systemic VTI:   0.17 m                            Systemic Diam: 1.90 cm  Kate Sable MD Electronically signed by Kate Sable MD Signature Date/Time: 12/22/2021/1:47:15 PM      Final   CT ABDOMEN PELVIS WO CONTRAST CLINICAL DATA:  Abdominal pain  EXAM: CT ABDOMEN AND PELVIS WITHOUT CONTRAST  TECHNIQUE: Multidetector CT imaging of the abdomen and pelvis was performed following the standard protocol without IV contrast.  RADIATION DOSE REDUCTION: This exam was performed according to the departmental dose-optimization program which includes automated exposure control, adjustment of the mA and/or kV according to patient size and/or use of iterative reconstruction technique.  COMPARISON:  02/19/2017  FINDINGS: Lower chest:  Lung bases are clear.  Hepatobiliary: Unenhanced liver is unremarkable.  Layering gallbladder sludge (series 2/image 26). No intrahepatic or extrahepatic ductal dilation.  Pancreas: Within normal limits.  Spleen: Within normal limits.  Adrenals/Urinary Tract: Adrenal glands are within normal limits.  Kidneys are within normal limits. No renal, ureteral, or bladder calculi. No hydronephrosis.  Bladder is within normal limits.  Stomach/Bowel: Stomach is within normal limits.  No evidence of bowel obstruction.  Prior appendectomy.  Left colonic diverticulosis, without evidence of diverticulitis.  Vascular/Lymphatic: No evidence of abdominal aortic aneurysm.  No suspicious abdominopelvic lymphadenopathy.  Reproductive: Status post hysterectomy.  No adnexal masses.  Other: No abdominopelvic ascites.  Musculoskeletal: Anterior and posterior lumbar fixation at L4-S1. Stable grade 2 anterolisthesis of L5 on S1. Right hip arthroplasty, without evidence of complication.  IMPRESSION: No evidence of bowel obstruction. Prior appendectomy. Left colonic diverticulosis, without evidence of diverticulitis.  No CT findings to account for the patient's  abdominal pain.  Electronically Signed   By: Julian Hy M.D.   On: 12/22/2021 01:29  Assessment  The primary encounter diagnosis was Chronic low back pain (Bilateral) w/o sciatica. Diagnoses of Lumbar facet syndrome (Bilateral) (R>L), Lumbar facet arthropathy, Spondylosis without myelopathy or radiculopathy, lumbosacral region, Chronic lower extremity pain (Right), DDD (degenerative disc disease), lumbosacral, Failed back surgical syndrome, and Chronic anticoagulation (Xarelto) were also pertinent to this visit.  Plan of Care  Problem-specific:  No problem-specific Assessment & Plan notes found for this encounter.  Ms. MYLINH CRAGG has a current medication list which includes the following long-term medication(s): albuterol, atorvastatin, azelastine, gnp calcium 1200, famotidine, furosemide, isosorbide mononitrate, metoprolol tartrate, rivaroxaban, and topiramate.  Pharmacotherapy (Medications Ordered): No orders of the defined types were placed in this encounter.  Orders:  Orders Placed This Encounter  Procedures   LUMBAR FACET(MEDIAL BRANCH NERVE BLOCK) MBNB    Standing Status:   Future    Standing Expiration Date:   07/08/2022    Scheduling Instructions:     Procedure: Lumbar facet block (AKA.: Lumbosacral medial branch nerve block)     Side: Bilateral     Level: L3-4 & L5-S1 Facets (L2, L3, L4, L5, & S1 Medial Branch Nerves)     Sedation: Patient's choice.     Timeframe: Stop Xarelto for 3 days prior to procedure    Order Specific Question:   Where will this procedure be performed?    Answer:   [facility]   Blood Thinner Instructions to Nursing    Always make sure patient has clearance from prescribing physician to stop blood thinners for interventional therapies. If the patient requires a Lovenox-bridge therapy, make sure arrangements are made to institute it with the assistance of the PCP.    Scheduling Instructions:     Have Ms. Vanblarcom stop the Xarelto  (Rivaroxaban) x 3 days prior to procedure or surgery.   Follow-up plan:   Return for procedure (ECT): (B) L-FCT Blk , (Blood Thinner Protocol).     Interventional Therapies  Risk  Complexity Considerations:   Estimated body mass index is 31.83 kg/m as calculated from the following:   Height as of 05/28/21: '5\' 2"'  (1.575 m).   Weight as of 05/28/21: 174 lb (78.9 kg).   Anticoagulation: Xarelto. (Stop: 3 days  Restart: 6 hrs)  Allergy: Iodine and radiological contrast  Implant: Internal cardiac defibrillator  History of noncompliance with post procedure follow-ups. (Prior patient of Dr. Mohammed Kindle) NOT a candidate for Lumbar RFA 2ry to Fusion Hardware.  Planned  Pending:   Palliative bilateral lumbar facet MBB #[id number]    Under consideration:   She definitely does not want to proceed with the pump. (09/02/2021)    Completed:   Therapeutic left lumbar facet MBB x8 (12/19/2021) (100/100/100/100)  Therapeutic right lumbar facet MBB x9 (12/19/2021) (100/100/100/100)  Therapeutic bilateral L5 TFESI x2 (05/31/2019) (80/80/80/>75)  Therapeutic right lumbar facet RFA x1 (02/07/2020) (DO NOT REPEAT - FUSION hardware)   Completed by other providers:   Diagnostic right lumbar facet MBB x1 (03/12/2015) (n/a) by Dr. Mohammed Kindle  Diagnostic/therapeutic right SI joint NB x3 (02/05/2015) (n/a) by Dr. Mohammed Kindle  Lumbar spine surgery: ALIF L4-5, PSF L4-S1 (Dr. Elayne Guerin) (09/29/2019)    Therapeutic  Palliative (PRN) options:   Palliative lumbar facet MBB (for low back pain)  Palliative L5 TFESI (for radicular lower extremity pain)     Recent Visits No visits were found meeting these conditions. Showing recent visits within past 90 days and meeting all other requirements Today's Visits Date Type Provider Dept  04/07/22 Office Visit Milinda Pointer, MD [facility]  Showing today's visits and meeting all other requirements Future Appointments No visits were found  meeting these conditions. Showing future appointments within next 90 days and meeting all other requirements  I discussed the assessment and treatment plan with the patient. The patient was provided an opportunity to ask questions and all were answered. The patient agreed with the plan and demonstrated an understanding of the instructions.  Patient advised to call back or seek an in-person evaluation if the symptoms or condition worsens.  Duration of encounter: 15 minutes.  Note by: [name], MD Date: 04/07/2022; Time: 12:38 PM

## 2022-04-07 ENCOUNTER — Ambulatory Visit: Payer: Medicaid Other | Attending: Pain Medicine | Admitting: Pain Medicine

## 2022-04-07 DIAGNOSIS — M545 Low back pain, unspecified: Secondary | ICD-10-CM | POA: Diagnosis not present

## 2022-04-07 DIAGNOSIS — Z7901 Long term (current) use of anticoagulants: Secondary | ICD-10-CM

## 2022-04-07 DIAGNOSIS — M5137 Other intervertebral disc degeneration, lumbosacral region: Secondary | ICD-10-CM

## 2022-04-07 DIAGNOSIS — M47816 Spondylosis without myelopathy or radiculopathy, lumbar region: Secondary | ICD-10-CM

## 2022-04-07 DIAGNOSIS — M961 Postlaminectomy syndrome, not elsewhere classified: Secondary | ICD-10-CM

## 2022-04-07 DIAGNOSIS — M79604 Pain in right leg: Secondary | ICD-10-CM

## 2022-04-07 DIAGNOSIS — M47817 Spondylosis without myelopathy or radiculopathy, lumbosacral region: Secondary | ICD-10-CM

## 2022-04-07 DIAGNOSIS — G8929 Other chronic pain: Secondary | ICD-10-CM

## 2022-04-07 NOTE — Patient Instructions (Signed)
____________________________________________________________________________________________  Blood Thinners  IMPORTANT NOTICE:  If you take any of these, make sure to notify the nursing staff.  Failure to do so may result in injury.  Recommended time intervals to stop and restart blood-thinners, before & after invasive procedures  Generic Name Brand Name Pre-procedure. Stop this long before procedure. Post-procedure. Minimum waiting period before restarting.  Abciximab Reopro 15 days 2 hrs  Alteplase Activase 10 days 10 days  Anagrelide Agrylin    Apixaban Eliquis 3 days 6 hrs  Cilostazol Pletal 3 days 5 hrs  Clopidogrel Plavix 7-10 days 2 hrs  Dabigatran Pradaxa 5 days 6 hrs  Dalteparin Fragmin 24 hours 4 hrs  Dipyridamole Aggrenox 11days 2 hrs  Edoxaban Lixiana; Savaysa 3 days 2 hrs  Enoxaparin  Lovenox 24 hours 4 hrs  Eptifibatide Integrillin 8 hours 2 hrs  Fondaparinux  Arixtra 72 hours 12 hrs  Hydroxychloroquine Plaquenil 11 days   Prasugrel Effient 7-10 days 6 hrs  Reteplase Retavase 10 days 10 days  Rivaroxaban Xarelto 3 days 6 hrs  Ticagrelor Brilinta 5-7 days 6 hrs  Ticlopidine Ticlid 10-14 days 2 hrs  Tinzaparin Innohep 24 hours 4 hrs  Tirofiban Aggrastat 8 hours 2 hrs  Warfarin Coumadin 5 days 2 hrs   Other medications with blood-thinning effects  Product indications Generic (Brand) names Note  Cholesterol Lipitor Stop 4 days before procedure  Blood thinner (injectable) Heparin (LMW or LMWH Heparin) Stop 24 hours before procedure  Cancer Ibrutinib (Imbruvica) Stop 7 days before procedure  Malaria/Rheumatoid Hydroxychloroquine (Plaquenil) Stop 11 days before procedure  Thrombolytics  10 days before or after procedures   Over-the-counter (OTC) Products with blood-thinning effects  Product Common names Stop Time  Aspirin > 325 mg Goody Powders, Excedrin, etc. 11 days  Aspirin ? 81 mg  7 days  Fish oil  4 days  Garlic supplements  7 days  Ginkgo biloba  36  hours  Ginseng  24 hours  NSAIDs Ibuprofen, Naprosyn, etc. 3 days  Vitamin E  4 days   ____________________________________________________________________________________________  ______________________________________________________________________  Preparing for Procedure with Sedation  NOTICE: Due to recent regulatory changes, starting on March 25, 2021, procedures requiring intravenous (IV) sedation will no longer be performed at the [facility].  These types of procedures are required to be performed at [facility] facility.  We are very sorry for the inconvenience.  Procedure appointments are limited to planned procedures: No Prescription Refills. No disability issues will be discussed. No medication changes will be discussed.  Instructions: Oral Intake: Do not eat or drink anything for at least 8 hours prior to your procedure. (Exception: Blood Pressure Medication. See below.) Transportation: A driver is required. You may not drive yourself after the procedure. Blood Pressure Medicine: Do not forget to take your blood pressure medicine with a sip of water the morning of the procedure. If your Diastolic (lower reading) is above 100 mmHg, elective cases will be cancelled/rescheduled. Blood thinners: These will need to be stopped for procedures. Notify our staff if you are taking any blood thinners. Depending on which one you take, there will be specific instructions on how and when to stop it. Diabetics on insulin: Notify the staff so that you can be scheduled 1st case in the morning. If your diabetes requires high dose insulin, take only  of your normal insulin dose the morning of the procedure and notify the staff that you have done so. Preventing infections: Shower with an antibacterial soap the morning of your  procedure. Build-up your immune system: Take 1000 mg of Vitamin C with every meal (3 times a day) the day prior to your procedure. Antibiotics: Inform  the staff if you have a condition or reason that requires you to take antibiotics before dental procedures. Pregnancy: If you are pregnant, call and cancel the procedure. Sickness: If you have a cold, fever, or any active infections, call and cancel the procedure. Arrival: You must be in the facility at least 30 minutes prior to your scheduled procedure. Children: Do not bring children with you. Dress appropriately: There is always the possibility that your clothing may get soiled. Valuables: Do not bring any jewelry or valuables.  Reasons to call and reschedule or cancel your procedure: (Following these recommendations will minimize the risk of a serious complication.) Surgeries: Avoid having procedures within 2 weeks of any surgery. (Avoid for 2 weeks before or after any surgery). Flu Shots: Avoid having procedures within 2 weeks of a flu shots. (Avoid for 2 weeks before or after immunizations). Barium: Avoid having a procedure within 7-10 days after having had a radiological study involving the use of radiological contrast. (Myelograms, Barium swallow or enema study). Heart attacks: Avoid any elective procedures or surgeries for the initial 6 months after a "Myocardial Infarction" (Heart Attack). Blood thinners: It is imperative that you stop these medications before procedures. Let us know if you if you take any blood thinner.  Infection: Avoid procedures during or within two weeks of an infection (including chest colds or gastrointestinal problems). Symptoms associated with infections include: Localized redness, fever, chills, night sweats or profuse sweating, burning sensation when voiding, cough, congestion, stuffiness, runny nose, sore throat, diarrhea, nausea, vomiting, cold or Flu symptoms, recent or current infections. It is specially important if the infection is over the area that we intend to treat. Heart and lung problems: Symptoms that may suggest an active cardiopulmonary problem  include: cough, chest pain, breathing difficulties or shortness of breath, dizziness, ankle swelling, uncontrolled high or unusually low blood pressure, and/or palpitations. If you are experiencing any of these symptoms, cancel your procedure and contact your primary care physician for an evaluation.  Remember:  Regular Business hours are:  Monday to Thursday 8:00 AM to 4:00 PM  Provider's Schedule: Milinda Pointer, MD:  Procedure days: Tuesday and Thursday 7:30 AM to 4:00 PM  Gillis Santa, MD:  Procedure days: Monday and Wednesday 7:30 AM to 4:00 PM ______________________________________________________________________  ____________________________________________________________________________________________  General Risks and Possible Complications  Patient Responsibilities: It is important that you read this as it is part of your informed consent. It is our duty to inform you of the risks and possible complications associated with treatments offered to you. It is your responsibility as a patient to read this and to ask questions about anything that is not clear or that you believe was not covered in this document.  Patient's Rights: You have the right to refuse treatment. You also have the right to change your mind, even after initially having agreed to have the treatment done. However, under this last option, if you wait until the last second to change your mind, you may be charged for the materials used up to that point.  Introduction: Medicine is not an Chief Strategy Officer. Everything in Medicine, including the lack of treatment(s), carries the potential for danger, harm, or loss (which is by definition: Risk). In Medicine, a complication is a secondary problem, condition, or disease that can aggravate an already existing one. All treatments carry the  risk of possible complications. The fact that a side effects or complications occurs, does not imply that the treatment was conducted  incorrectly. It must be clearly understood that these can happen even when everything is done following the highest safety standards.  No treatment: You can choose not to proceed with the proposed treatment alternative. The "PRO(s)" would include: avoiding the risk of complications associated with the therapy. The "CON(s)" would include: not getting any of the treatment benefits. These benefits fall under one of three categories: diagnostic; therapeutic; and/or palliative. Diagnostic benefits include: getting information which can ultimately lead to improvement of the disease or symptom(s). Therapeutic benefits are those associated with the successful treatment of the disease. Finally, palliative benefits are those related to the decrease of the primary symptoms, without necessarily curing the condition (example: decreasing the pain from a flare-up of a chronic condition, such as incurable terminal cancer).  General Risks and Complications: These are associated to most interventional treatments. They can occur alone, or in combination. They fall under one of the following six (6) categories: no benefit or worsening of symptoms; bleeding; infection; nerve damage; allergic reactions; and/or death. No benefits or worsening of symptoms: In Medicine there are no guarantees, only probabilities. No healthcare provider can ever guarantee that a medical treatment will work, they can only state the probability that it may. Furthermore, there is always the possibility that the condition may worsen, either directly, or indirectly, as a consequence of the treatment. Bleeding: This is more common if the patient is taking a blood thinner, either prescription or over the counter (example: Goody Powders, Fish oil, Aspirin, Garlic, etc.), or if suffering a condition associated with impaired coagulation (example: Hemophilia, cirrhosis of the liver, low platelet counts, etc.). However, even if you do not have one on these, it can  still happen. If you have any of these conditions, or take one of these drugs, make sure to notify your treating physician. Infection: This is more common in patients with a compromised immune system, either due to disease (example: diabetes, cancer, human immunodeficiency virus [HIV], etc.), or due to medications or treatments (example: therapies used to treat cancer and rheumatological diseases). However, even if you do not have one on these, it can still happen. If you have any of these conditions, or take one of these drugs, make sure to notify your treating physician. Nerve Damage: This is more common when the treatment is an invasive one, but it can also happen with the use of medications, such as those used in the treatment of cancer. The damage can occur to small secondary nerves, or to large primary ones, such as those in the spinal cord and brain. This damage may be temporary or permanent and it may lead to impairments that can range from temporary numbness to permanent paralysis and/or brain death. Allergic Reactions: Any time a substance or material comes in contact with our body, there is the possibility of an allergic reaction. These can range from a mild skin rash (contact dermatitis) to a severe systemic reaction (anaphylactic reaction), which can result in death. Death: In general, any medical intervention can result in death, most of the time due to an unforeseen complication. ____________________________________________________________________________________________

## 2022-04-14 NOTE — Progress Notes (Unsigned)
PROVIDER NOTE: Interpretation of information contained herein should be left to medically-trained personnel. Specific patient instructions are provided elsewhere under "Patient Instructions" section of medical record. This document was created in part using STT-dictation technology, any transcriptional errors that may result from this process are unintentional.  Patient: Claire Mcdonald Type: Established DOB: 12-25-1965 MRN: [id number] PCP: [name], MD  Service: Procedure DOS: 04/15/2022 Setting: Ambulatory Location: Ambulatory outpatient facility Delivery: Face-to-face Provider: [name], MD Specialty: Interventional Pain Management Specialty designation: 09 Location: Outpatient facility Ref. Prov.: Milinda Pointer, MD    Procedure:           Type: Lumbar Facet, Medial Branch Block(s)  R10L9   Laterality: Bilateral  Level: L2, L3, L4, L5, & S1 Medial Branch Level(s). Injecting these levels blocks the L3-4, L4-5 and L5-S1 lumbar facet joints. Imaging: Fluoroscopic guidance         Anesthesia: Local anesthesia (1-2% Lidocaine) Anxiolysis: IV Versed         Sedation: Moderate Sedation              .  DOS: 04/15/2022 Performed by: [name], MD  Primary Purpose: Diagnostic/Therapeutic Indications: Low back pain severe enough to impact quality of life or function. 1. Lumbar facet syndrome (Bilateral) (R>L)   2. Spondylosis without myelopathy or radiculopathy, lumbosacral region   3. 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 Grade 2-3 (13-16 mm) Anterolisthesis of L5/S1 (06/22/2019)   5. DDD (degenerative disc disease), lumbosacral   6. Failed back surgical syndrome   7. Chronic low back pain (Bilateral) w/o sciatica    Wheelchair dependent    History of Allergy to iodine    History of allergy to radiographic contrast media    Chronic anticoagulation (Xarelto)    NAS-11 Pain score:   Pre-procedure: 9 /10   Post-procedure: 0-No pain/10      Position / Prep / Materials:  Position: Prone  Prep solution: DuraPrep (Iodine Povacrylex [0.7% available iodine] and Isopropyl Alcohol, 74% w/w) Area Prepped: Posterolateral Lumbosacral Spine (Wide prep: From the lower border of the scapula down to the end of the tailbone and from flank to flank.)  Materials:  Tray: Block Needle(s):  Type: Spinal  Gauge (G): 22  Length: 5-in Qty: 4     Pre-op H&P Assessment:  Claire Mcdonald is a 56 y.o. (year old), female patient, seen today for interventional treatment. She  has a past surgical history that includes Tubal ligation (1998); Ovarian cyst removal (Left); central line (03/25/2015); Hand surgery (Left); Cardiac catheterization (N/A, 03/27/2015); Cardiac catheterization (N/A, 04/11/2015); Cardiac catheterization (N/A, 07/16/2015); Colonoscopy with propofol (N/A, 10/01/2015); Esophagogastroduodenoscopy (egd) with propofol (N/A, 10/01/2015); Abdominal hysterectomy; and Hip Arthroplasty (Right, 02/27/2020). Claire Mcdonald has a current medication list which includes the following prescription(s): acetaminophen, albuterol, atorvastatin, vitamin d3, furosemide, gabapentin, isosorbide mononitrate, ketoconazole, lisinopril, magnesium, metoprolol tartrate, polyethylene glycol, rivaroxaban, senna-docusate, sertraline, topiramate, triamcinolone, azelastine, gnp calcium 1200, and famotidine, and the following Facility-Administered Medications: fentanyl, lactated ringers, and pentafluoroprop-tetrafluoroeth. Her primarily concern today is the Back Pain (Lower, worse on right side)  Initial Vital Signs:  Pulse/HCG Rate: (!) 50ECG Heart Rate: (!) 53 (SB) Temp: (!) 97 F (36.1 C) Resp: 16 BP: 105/76 SpO2: 99 %  BMI: Estimated body mass index is 28.72 kg/m as calculated from the following:   Height as of this encounter: '5\' 2"'$  (1.575 m).   Weight as of this encounter: 157 lb (71.2 kg).  Risk Assessment: Allergies: Reviewed. She is allergic  to tape, iodinated contrast media,  toradol [ketorolac tromethamine], tramadol, and zofran [ondansetron hcl].  Allergy Precautions: None required Coagulopathies: Reviewed. None identified.  Blood-thinner therapy: None at this time Active Infection(s): Reviewed. None identified. Ms. Voth is afebrile  Site Confirmation: Ms. Kimery was asked to confirm the procedure and laterality before marking the site Procedure checklist: Completed Consent: Before the procedure and under the influence of no sedative(s), amnesic(s), or anxiolytics, the patient was informed of the treatment options, risks and possible complications. To fulfill our ethical and legal obligations, as recommended by the American Medical Association's Code of Ethics, I have informed the patient of my clinical impression; the nature and purpose of the treatment or procedure; the risks, benefits, and possible complications of the intervention; the alternatives, including doing nothing; the risk(s) and benefit(s) of the alternative treatment(s) or procedure(s); and the risk(s) and benefit(s) of doing nothing. The patient was provided information about the general risks and possible complications associated with the procedure. These may include, but are not limited to: failure to achieve desired goals, infection, bleeding, organ or nerve damage, allergic reactions, paralysis, and death. In addition, the patient was informed of those risks and complications associated to Spine-related procedures, such as failure to decrease pain; infection (i.e.: Meningitis, epidural or intraspinal abscess); bleeding (i.e.: epidural hematoma, subarachnoid hemorrhage, or any other type of intraspinal or peri-dural bleeding); organ or nerve damage (i.e.: Any type of peripheral nerve, nerve root, or spinal cord injury) with subsequent damage to sensory, motor, and/or autonomic systems, resulting in permanent pain, numbness, and/or weakness of one or several areas of the body; allergic reactions; (i.e.:  anaphylactic reaction); and/or death. Furthermore, the patient was informed of those risks and complications associated with the medications. These include, but are not limited to: allergic reactions (i.e.: anaphylactic or anaphylactoid reaction(s)); adrenal axis suppression; blood sugar elevation that in diabetics may result in ketoacidosis or comma; water retention that in patients with history of congestive heart failure may result in shortness of breath, pulmonary edema, and decompensation with resultant heart failure; weight gain; swelling or edema; medication-induced neural toxicity; particulate matter embolism and blood vessel occlusion with resultant organ, and/or nervous system infarction; and/or aseptic necrosis of one or more joints. Finally, the patient was informed that Medicine is not an exact science; therefore, there is also the possibility of unforeseen or unpredictable risks and/or possible complications that may result in a catastrophic outcome. The patient indicated having understood very clearly. We have given the patient no guarantees and we have made no promises. Enough time was given to the patient to ask questions, all of which were answered to the patient's satisfaction. Ms. Condrey has indicated that she wanted to continue with the procedure. Attestation: I, the ordering provider, attest that I have discussed with the patient the benefits, risks, side-effects, alternatives, likelihood of achieving goals, and potential problems during recovery for the procedure that I have provided informed consent. Date  Time: 04/15/2022  8:14 AM  Pre-Procedure Preparation:  Monitoring: As per clinic protocol. Respiration, ETCO2, SpO2, BP, heart rate and rhythm monitor placed and checked for adequate function Safety Precautions: Patient was assessed for positional comfort and pressure points before starting the procedure. Time-out: I initiated and conducted the "Time-out" before starting the  procedure, as per protocol. The patient was asked to participate by confirming the accuracy of the "Time Out" information. Verification of the correct person, site, and procedure were performed and confirmed by me, the nursing staff, and the patient. "Time-out" conducted as  per Joint Commission's Universal Protocol (UP.01.01.01). Time: 0935  Description of Procedure:          Laterality: Bilateral. The procedure was performed in identical fashion on both sides. Targeted Levels:  L2, L3, L4, L5, & S1 Medial Branch Level(s)  Safety Precautions: Aspiration looking for blood return was conducted prior to all injections. At no point did we inject any substances, as a needle was being advanced. Before injecting, the patient was told to immediately notify me if she was experiencing any new onset of "ringing in the ears, or metallic taste in the mouth". No attempts were made at seeking any paresthesias. Safe injection practices and needle disposal techniques used. Medications properly checked for expiration dates. SDV (single dose vial) medications used. After the completion of the procedure, all disposable equipment used was discarded in the proper designated medical waste containers. Local Anesthesia: Protocol guidelines were followed. The patient was positioned over the fluoroscopy table. The area was prepped in the usual manner. The time-out was completed. The target area was identified using fluoroscopy. A 12-in long, straight, sterile hemostat was used with fluoroscopic guidance to locate the targets for each level blocked. Once located, the skin was marked with an approved surgical skin marker. Once all sites were marked, the skin (epidermis, dermis, and hypodermis), as well as deeper tissues (fat, connective tissue and muscle) were infiltrated with a small amount of a short-acting local anesthetic, loaded on a 10cc syringe with a 25G, 1.5-in  Needle. An appropriate amount of time was allowed for local  anesthetics to take effect before proceeding to the next step. Local Anesthetic: Lidocaine 2.0% The unused portion of the local anesthetic was discarded in the proper designated containers. Technical description of process:  L2 Medial Branch Nerve Block (MBB): The target area for the L2 medial branch is at the junction of the postero-lateral aspect of the superior articular process and the superior, posterior, and medial edge of the transverse process of L3. Under fluoroscopic guidance, a Quincke needle was inserted until contact was made with os over the superior postero-lateral aspect of the pedicular shadow (target area). After negative aspiration for blood, 0.5 mL of the nerve block solution was injected without difficulty or complication. The needle was removed intact. L3 Medial Branch Nerve Block (MBB): The target area for the L3 medial branch is at the junction of the postero-lateral aspect of the superior articular process and the superior, posterior, and medial edge of the transverse process of L4. Under fluoroscopic guidance, a Quincke needle was inserted until contact was made with os over the superior postero-lateral aspect of the pedicular shadow (target area). After negative aspiration for blood, 0.5 mL of the nerve block solution was injected without difficulty or complication. The needle was removed intact. L4 Medial Branch Nerve Block (MBB): The target area for the L4 medial branch is at the junction of the postero-lateral aspect of the superior articular process and the superior, posterior, and medial edge of the transverse process of L5. Under fluoroscopic guidance, a Quincke needle was inserted until contact was made with os over the superior postero-lateral aspect of the pedicular shadow (target area). After negative aspiration for blood, 0.5 mL of the nerve block solution was injected without difficulty or complication. The needle was removed intact. L5 Medial Branch Nerve Block (MBB):  The target area for the L5 medial branch is at the junction of the postero-lateral aspect of the superior articular process and the superior, posterior, and medial edge of the sacral  ala. Under fluoroscopic guidance, a Quincke needle was inserted until contact was made with os over the superior postero-lateral aspect of the pedicular shadow (target area). After negative aspiration for blood, 0.5 mL of the nerve block solution was injected without difficulty or complication. The needle was removed intact. S1 Medial Branch Nerve Block (MBB): The target area for the S1 medial branch is at the posterior and inferior 6 o'clock position of the L5-S1 facet joint. Under fluoroscopic guidance, the Quincke needle inserted for the L5 MBB was redirected until contact was made with os over the inferior and postero aspect of the sacrum, at the 6 o' clock position under the L5-S1 facet joint (Target area). After negative aspiration for blood, 0.5 mL of the nerve block solution was injected without difficulty or complication. The needle was removed intact.  Once the entire procedure was completed, the treated area was cleaned, making sure to leave some of the prepping solution back to take advantage of its long term bactericidal properties.         Illustration of the posterior view of the lumbar spine and the posterior neural structures. Laminae of L2 through S1 are labeled. DPRL5, dorsal primary ramus of L5; DPRS1, dorsal primary ramus of S1; DPR3, dorsal primary ramus of L3; FJ, facet (zygapophyseal) joint L3-L4; I, inferior articular process of L4; LB1, lateral branch of dorsal primary ramus of L1; IAB, inferior articular branches from L3 medial branch (supplies L4-L5 facet joint); IBP, intermediate branch plexus; MB3, medial branch of dorsal primary ramus of L3; NR3, third lumbar nerve root; S, superior articular process of L5; SAB, superior articular branches from L4 (supplies L4-5 facet joint also); TP3, transverse  process of L3.  Vitals:   04/15/22 0950 04/15/22 1000 04/15/22 1010 04/15/22 1018  BP: 111/61 110/63 111/71 112/70  Pulse:      Resp: '15 16 17 16  '$ Temp:      SpO2: 99% 97% 97% 98%  Weight:      Height:         Start Time: 0935 hrs. End Time: 0950 hrs.  Imaging Guidance (Spinal):          Type of Imaging Technique: Fluoroscopy Guidance (Spinal) Indication(s): Assistance in needle guidance and placement for procedures requiring needle placement in or near specific anatomical locations not easily accessible without such assistance. Exposure Time: Please see nurses notes. Contrast: None used. Fluoroscopic Guidance: I was personally present during the use of fluoroscopy. "Tunnel Vision Technique" used to obtain the best possible view of the target area. Parallax error corrected before commencing the procedure. "Direction-depth-direction" technique used to introduce the needle under continuous pulsed fluoroscopy. Once target was reached, antero-posterior, oblique, and lateral fluoroscopic projection used confirm needle placement in all planes. Images permanently stored in EMR. Interpretation: No contrast injected. I personally interpreted the imaging intraoperatively. Adequate needle placement confirmed in multiple planes. Permanent images saved into the patient's record.  Antibiotic Prophylaxis:   Anti-infectives (From admission, onward)    None      Indication(s): None identified  Post-operative Assessment:  Post-procedure Vital Signs:  Pulse/HCG Rate: (!) 50(!) 55 Temp: (!) 97 F (36.1 C) Resp: 16 BP: 112/70 SpO2: 98 %  EBL: None  Complications: No immediate post-treatment complications observed by team, or reported by patient.  Note: The patient tolerated the entire procedure well. A repeat set of vitals were taken after the procedure and the patient was kept under observation following institutional policy, for this type of procedure. Post-procedural neurological  assessment was performed, showing return to baseline, prior to discharge. The patient was provided with post-procedure discharge instructions, including a section on how to identify potential problems. Should any problems arise concerning this procedure, the patient was given instructions to immediately contact us, at any time, without hesitation. In any case, we plan to contact the patient by telephone for a follow-up status report regarding this interventional procedure.  Comments:  No additional relevant information.  Plan of Care  Orders:  Orders Placed This Encounter  Procedures   LUMBAR FACET(MEDIAL BRANCH NERVE BLOCK) MBNB    Scheduling Instructions:     Procedure: Lumbar facet block (AKA.: Lumbosacral medial branch nerve block)     Side: Bilateral     Level: L3-4 & L5-S1 Facets (L2, L3, L4, L5, & S1 Medial Branch Nerves)     Sedation: Patient's choice.     Timeframe: Today    Order Specific Question:   Where will this procedure be performed?    Answer:   [facility]   DG [facility] C-ARM 1-60 MIN NO REPORT    Intraoperative interpretation by procedural physician at [place].    Standing Status:   Standing    Number of Occurrences:   1    Order Specific Question:   Reason for exam:    Answer:   Assistance in needle guidance and placement for procedures requiring needle placement in or near specific anatomical locations not easily accessible without such assistance.   Informed Consent Details: Physician/Practitioner Attestation; Transcribe to consent form and obtain patient signature    Nursing Order: Transcribe to consent form and obtain patient signature. Note: Always confirm laterality of pain with Ms. Foister, before procedure.    Order Specific Question:   Physician/Practitioner attestation of informed consent for procedure/surgical case    Answer:   I, the physician/practitioner, attest that I have discussed with the patient the benefits, risks, side  effects, alternatives, likelihood of achieving goals and potential problems during recovery for the procedure that I have provided informed consent.    Order Specific Question:   Procedure    Answer:   Lumbar Facet Block  under fluoroscopic guidance    Order Specific Question:   Physician/Practitioner performing the procedure    Answer:   Shacoya Burkhammer A. Dossie Arbour MD    Order Specific Question:   Indication/Reason    Answer:   Low Back Pain, with our without leg pain, due to Facet Joint Arthralgia (Joint Pain) Spondylosis (Arthritis of the Spine), without myelopathy or radiculopathy (Nerve Damage).   Care order/instruction: Please confirm that the patient has stopped the Xarelto (Rivaroxaban) x 3 days prior to procedure or surgery.    Please confirm that the patient has stopped the Xarelto (Rivaroxaban) x 3 days prior to procedure or surgery.    Standing Status:   Standing    Number of Occurrences:   1   Provide equipment / supplies at bedside    "Block Tray" (Disposable  single use) Needle type: SpinalSpinal Amount/quantity: 4 Size: Medium (5-inch) Gauge: 22G    Standing Status:   Standing    Number of Occurrences:   1    Order Specific Question:   Specify    Answer:   Block Tray   Miscellanous precautions    NOTE: Although It is true that patients can have allergies to shellfish and that shellfish contain iodine, most shellfish  allergies are due to two protein allergens present in the shellfish: tropomyosins and parvalbumin. Not all  patients with shellfish allergies are allergic to iodine. However, as a precaution, avoid using iodine containing products.    Standing Status:   Standing    Number of Occurrences:   1   Miscellanous precautions    Standing Status:   Standing    Number of Occurrences:   1   Bleeding precautions    Standing Status:   Standing    Number of Occurrences:   1   Chronic Opioid Analgesic:  No opioid analgesics prescribed by our practice.  The patient received a  letter from [facility] indicating that they would not be paying for her oxycodone prescriptions.   Medications ordered for procedure: Meds ordered this encounter  Medications   lidocaine (XYLOCAINE) 2 % (with pres) injection 400 mg   pentafluoroprop-tetrafluoroeth (GEBAUERS) aerosol   lactated ringers infusion   midazolam (VERSED) 5 MG/5ML injection 0.5-2 mg    Make sure Flumazenil is available in the pyxis when using this medication. If oversedation occurs, administer 0.2 mg IV over 15 sec. If after 45 sec no response, administer 0.2 mg again over 1 min; may repeat at 1 min intervals; not to exceed 4 doses (1 mg)   fentaNYL (SUBLIMAZE) injection 25-50 mcg    Make sure Narcan is available in the pyxis when using this medication. In the event of respiratory depression (RR< 8/min): Titrate NARCAN (naloxone) in increments of 0.1 to 0.2 mg IV at 2-3 minute intervals, until desired degree of reversal.   ropivacaine (PF) 2 mg/mL (0.2%) (NAROPIN) injection 18 mL   triamcinolone acetonide (KENALOG-40) injection 80 mg   Medications administered: We administered lidocaine, midazolam, fentaNYL, ropivacaine (PF) 2 mg/mL (0.2%), and triamcinolone acetonide.  See the medical record for exact dosing, route, and time of administration.  Follow-up plan:   Return in about 2 weeks (around 04/29/2022) for Proc-day (T,Th), (VV), (PPE).       Interventional Therapies  Risk  Complexity Considerations:   Estimated body mass index is 31.83 kg/m as calculated from the following:   Height as of 05/28/21: '5\' 2"'$  (1.575 m).   Weight as of 05/28/21: 174 lb (78.9 kg).   Anticoagulation: Xarelto. (Stop: 3 days  Restart: 6 hrs)  Allergy: Iodine and radiological contrast  Implant: Internal cardiac defibrillator  History of noncompliance with post procedure follow-ups. (Prior patient of Dr. Mohammed Kindle) NOT a candidate for Lumbar RFA 2ry to [employer].   Planned  Pending:   Palliative bilateral lumbar facet  MBB #[id number]    Under consideration:   She definitely does not want to proceed with the pump. (09/02/2021)    Completed:   Therapeutic left lumbar facet MBB x8 (12/19/2021) (100/100/100/100)  Therapeutic right lumbar facet MBB x9 (12/19/2021) (100/100/100/100)  Therapeutic bilateral L5 TFESI x2 (05/31/2019) (80/80/80/>75)  Therapeutic right lumbar facet RFA x1 (02/07/2020) (DO NOT REPEAT - FUSION hardware)   Completed by other providers:   Diagnostic right lumbar facet MBB x1 (03/12/2015) (n/a) by Dr. Mohammed Kindle  Diagnostic/therapeutic right SI joint NB x3 (02/05/2015) (n/a) by Dr. Mohammed Kindle  Lumbar spine surgery: ALIF L4-5, PSF L4-S1 (Dr. Elayne Guerin) (09/29/2019)    Therapeutic  Palliative (PRN) options:   Palliative lumbar facet MBB (for low back pain)  Palliative L5 TFESI (for radicular lower extremity pain)      Recent Visits Date Type Provider Dept  04/07/22 Office Visit Milinda Pointer, MD [facility]  Showing recent visits within past 90 days and meeting all other requirements Today's Visits Date Type Provider Dept  04/15/22 Procedure visit Milinda Pointer, MD [facility]  Showing today's visits and meeting all other requirements Future Appointments Date Type Provider Dept  04/29/22 Appointment Milinda Pointer, MD [facility]  Showing future appointments within next 90 days and meeting all other requirements  Disposition: Discharge home  Discharge (Date  Time): 04/15/2022; 1023 hrs.   Primary Care Physician: [name], MD Location: [facility] Note by: [name], MD Date: 04/15/2022; Time: 12:10 PM  Disclaimer:  Medicine is not an Chief Strategy Officer. The only guarantee in medicine is that nothing is guaranteed. It is important to note that the decision to proceed with this intervention was based on the information collected from the patient. The Data and conclusions were drawn from  the patient's questionnaire, the interview, and the physical examination. Because the information was provided in large part by the patient, it cannot be guaranteed that it has not been purposely or unconsciously manipulated. Every effort has been made to obtain as much relevant data as possible for this evaluation. It is important to note that the conclusions that lead to this procedure are derived in large part from the available data. Always take into account that the treatment will also be dependent on availability of resources and existing treatment guidelines, considered by other Pain Management Practitioners as being common knowledge and practice, at the time of the intervention. For Medico-Legal purposes, it is also important to point out that variation in procedural techniques and pharmacological choices are the acceptable norm. The indications, contraindications, technique, and results of the above procedure should only be interpreted and judged by a Board-Certified Interventional Pain Specialist with extensive familiarity and expertise in the same exact procedure and technique.

## 2022-04-15 ENCOUNTER — Ambulatory Visit
Admission: RE | Admit: 2022-04-15 | Discharge: 2022-04-15 | Disposition: A | Discharge status: Home / Self Care | Payer: Medicaid Other | Source: Ambulatory Visit | Attending: Pain Medicine | Admitting: Pain Medicine

## 2022-04-15 ENCOUNTER — Encounter: Payer: Self-pay | Admitting: Pain Medicine

## 2022-04-15 ENCOUNTER — Ambulatory Visit: Payer: Medicaid Other | Attending: Pain Medicine | Admitting: Pain Medicine

## 2022-04-15 VITALS — BP 112/70 | HR 50 | Temp 97.0°F | Resp 16 | Ht 62.0 in | Wt 157.0 lb

## 2022-04-15 DIAGNOSIS — M47817 Spondylosis without myelopathy or radiculopathy, lumbosacral region: Secondary | ICD-10-CM | POA: Insufficient documentation

## 2022-04-15 DIAGNOSIS — Z888 Allergy status to other drugs, medicaments and biological substances status: Secondary | ICD-10-CM | POA: Insufficient documentation

## 2022-04-15 DIAGNOSIS — M532X7 Spinal instabilities, lumbosacral region: Secondary | ICD-10-CM | POA: Insufficient documentation

## 2022-04-15 DIAGNOSIS — M79604 Pain in right leg: Secondary | ICD-10-CM | POA: Diagnosis present

## 2022-04-15 DIAGNOSIS — Z91041 Radiographic dye allergy status: Secondary | ICD-10-CM | POA: Diagnosis present

## 2022-04-15 DIAGNOSIS — M5137 Other intervertebral disc degeneration, lumbosacral region: Secondary | ICD-10-CM | POA: Insufficient documentation

## 2022-04-15 DIAGNOSIS — M4310 Spondylolisthesis, site unspecified: Secondary | ICD-10-CM | POA: Diagnosis present

## 2022-04-15 DIAGNOSIS — Z993 Dependence on wheelchair: Secondary | ICD-10-CM | POA: Insufficient documentation

## 2022-04-15 DIAGNOSIS — Z7901 Long term (current) use of anticoagulants: Secondary | ICD-10-CM | POA: Diagnosis present

## 2022-04-15 DIAGNOSIS — M47816 Spondylosis without myelopathy or radiculopathy, lumbar region: Secondary | ICD-10-CM | POA: Insufficient documentation

## 2022-04-15 DIAGNOSIS — M545 Low back pain, unspecified: Secondary | ICD-10-CM | POA: Diagnosis present

## 2022-04-15 DIAGNOSIS — M961 Postlaminectomy syndrome, not elsewhere classified: Secondary | ICD-10-CM | POA: Insufficient documentation

## 2022-04-15 DIAGNOSIS — G8929 Other chronic pain: Secondary | ICD-10-CM | POA: Diagnosis present

## 2022-04-15 MED ORDER — MIDAZOLAM HCL 5 MG/5ML IJ SOLN
0.5000 mg | Freq: Once | INTRAMUSCULAR | Status: AC
  Administered 2022-04-15: 1 mg via INTRAVENOUS

## 2022-04-15 MED ORDER — ROPIVACAINE HCL 2 MG/ML IJ SOLN
INTRAMUSCULAR | Status: AC
  Filled 2022-04-15: qty 20

## 2022-04-15 MED ORDER — MIDAZOLAM HCL 5 MG/5ML IJ SOLN
INTRAMUSCULAR | Status: AC
  Filled 2022-04-15: qty 5

## 2022-04-15 MED ORDER — TRIAMCINOLONE ACETONIDE 40 MG/ML IJ SUSP
INTRAMUSCULAR | Status: AC
  Filled 2022-04-15: qty 1

## 2022-04-15 MED ORDER — TRIAMCINOLONE ACETONIDE 40 MG/ML IJ SUSP
80.0000 mg | Freq: Once | INTRAMUSCULAR | Status: AC
  Administered 2022-04-15: 80 mg

## 2022-04-15 MED ORDER — LACTATED RINGERS IV SOLN: Freq: Once | INTRAVENOUS | Status: DC

## 2022-04-15 MED ORDER — FENTANYL CITRATE (PF) 100 MCG/2ML IJ SOLN
INTRAMUSCULAR | Status: AC
  Filled 2022-04-15: qty 2

## 2022-04-15 MED ORDER — LIDOCAINE HCL 2 % IJ SOLN
INTRAMUSCULAR | Status: AC
  Filled 2022-04-15: qty 20

## 2022-04-15 MED ORDER — FENTANYL CITRATE (PF) 100 MCG/2ML IJ SOLN
25.0000 ug | INTRAMUSCULAR | Status: DC | PRN
  Administered 2022-04-15: 50 ug via INTRAVENOUS

## 2022-04-15 MED ORDER — LIDOCAINE HCL 2 % IJ SOLN
20.0000 mL | Freq: Once | INTRAMUSCULAR | Status: AC
  Administered 2022-04-15: 400 mg

## 2022-04-15 MED ORDER — ROPIVACAINE HCL 2 MG/ML IJ SOLN
18.0000 mL | Freq: Once | INTRAMUSCULAR | Status: AC
  Administered 2022-04-15: 18 mL via PERINEURAL

## 2022-04-15 MED ORDER — PENTAFLUOROPROP-TETRAFLUOROETH EX AERO
INHALATION_SPRAY | Freq: Once | CUTANEOUS | Status: DC
  Filled 2022-04-15: qty 116

## 2022-04-15 NOTE — Patient Instructions (Signed)
____________________________________________________________________________________________  Virtual Visits   What is a "Virtual Visit"? It is a healthcare communication encounter (medical visit) that takes place on real time (NOT TEXT or E-MAIL) over the telephone or computer device (desktop, laptop, tablet, smart phone, etc.). It allows for more location flexibility between the patient and the healthcare provider.  Who decides when these types of visits will be used? The physician.  Who is eligible for these types of visits? Only those patients that can be reliably reached over the telephone.  What do you mean by reliably? We do not have time to call everyone multiple times, therefore those that tend to screen calls and then call back later are not suitable candidates for this system. We understand how people are reluctant to pickup on "unknown" calls, therefore, we suggest adding our telephone numbers to your list of "CONTACT(s)". This way, you should be able to readily identify our calls when you receive one. All of our numbers are available below.   Who is not eligible? This option is not available for medication management encounters, specially for controlled substances. Patients on pain medications that fall under the category of controlled substances have to come in for "Face-to-Face" encounters. This is required for mandatory monitoring of these substances. You may be asked to provide a sample for an unannounced urine drug screening test (UDS), and we will need to count your pain pills. Not bringing your pills to be counted may result in no refill. Obviously, neither one of these can be done over the phone.  When will this type of visits be used? You can request a virtual visit whenever you are physically unable to attend a regular appointment. The decision will be made by the physician (or healthcare provider) on a case by case basis.   At what time will I be called? This is an  excellent question. The providers will try to call you whenever they have time available. Do not expect to be called at any specific time. The secretaries will assign you a time for your virtual visit appointment, but this is done simply to keep a list of those patients that need to be called, but not for the purpose of keeping a time schedule. Be advised that the call may come in anytime during the day, between the hours of 8:00 AM and 8::00 PM, depending on provider availability. We do understand that the system is not perfect. If you are unable to be available that day on a moments notice, then request an "in-person" appointment rather than a "virtual visit".  Can I request my medication visits to be "Virtual"? Yes you may request it, but the decision is entirely up to the healthcare provider. Control substances require specific monitoring that requires Face-to-Face encounters. The number of encounters  and the extent of the monitoring is determined on a case by case basis.  Add a new contact to your smart phone and label it "PAIN CLINIC" Under this contact add the following numbers: Main: [phone] (Official Contact Number) Nurses: [phone] (These are outgoing only calling systems. Do not call this number.) Dr. Zubayr Bednarczyk: [phone] or [phone] (Outgoing calls only. Do not call this number.)  ____________________________________________________________________________________________    ____________________________________________________________________________________________  Post-Procedure Discharge Instructions  Instructions: Apply ice:  Purpose: This will minimize any swelling and discomfort after procedure.  When: Day of procedure, as soon as you get home. How: Fill a plastic sandwich bag with crushed ice. Cover it with a small towel and apply   to injection site. How long: (15 min on, 15 min off) Apply for 15 minutes then remove x 15 minutes.  Repeat sequence on  day of procedure, until you go to bed. Apply heat:  Purpose: To treat any soreness and discomfort from the procedure. When: Starting the next day after the procedure. How: Apply heat to procedure site starting the day following the procedure. How long: May continue to repeat daily, until discomfort goes away. Food intake: Start with clear liquids (like water) and advance to regular food, as tolerated.  Physical activities: Keep activities to a minimum for the first 8 hours after the procedure. After that, then as tolerated. Driving: If you have received any sedation, be responsible and do not drive. You are not allowed to drive for 24 hours after having sedation. Blood thinner: (Applies only to those taking blood thinners) You may restart your blood thinner 6 hours after your procedure. Insulin: (Applies only to Diabetic patients taking insulin) As soon as you can eat, you may resume your normal dosing schedule. Infection prevention: Keep procedure site clean and dry. Shower daily and clean area with soap and water. Post-procedure Pain Diary: Extremely important that this be done correctly and accurately. Recorded information will be used to determine the next step in treatment. For the purpose of accuracy, follow these rules: Evaluate only the area treated. Do not report or include pain from an untreated area. For the purpose of this evaluation, ignore all other areas of pain, except for the treated area. After your procedure, avoid taking a long nap and attempting to complete the pain diary after you wake up. Instead, set your alarm clock to go off every hour, on the hour, for the initial 8 hours after the procedure. Document the duration of the numbing medicine, and the relief you are getting from it. Do not go to sleep and attempt to complete it later. It will not be accurate. If you received sedation, it is likely that you were given a medication that may cause amnesia. Because of this,  completing the diary at a later time may cause the information to be inaccurate. This information is needed to plan your care. Follow-up appointment: Keep your post-procedure follow-up evaluation appointment after the procedure (usually 2 weeks for most procedures, 6 weeks for radiofrequencies). DO NOT FORGET to bring you pain diary with you.   Expect: (What should I expect to see with my procedure?) From numbing medicine (AKA: Local Anesthetics): Numbness or decrease in pain. You may also experience some weakness, which if present, could last for the duration of the local anesthetic. Onset: Full effect within 15 minutes of injected. Duration: It will depend on the type of local anesthetic used. On the average, 1 to 8 hours.  From steroids (Applies only if steroids were used): Decrease in swelling or inflammation. Once inflammation is improved, relief of the pain will follow. Onset of benefits: Depends on the amount of swelling present. The more swelling, the longer it will take for the benefits to be seen. In some cases, up to 10 days. Duration: Steroids will stay in the system x 2 weeks. Duration of benefits will depend on multiple posibilities including persistent irritating factors. Side-effects: If present, they may typically last 2 weeks (the duration of the steroids). Frequent: Cramps (if they occur, drink Gatorade and take over-the-counter Magnesium 450-500 mg once to twice a day); water retention with temporary weight gain; increases in blood sugar; decreased immune system response; increased appetite. Occasional: Facial flushing (  red, warm cheeks); mood swings; menstrual changes. Uncommon: Long-term decrease or suppression of natural hormones; bone thinning. (These are more common with higher doses or more frequent use. This is why we prefer that our patients avoid having any injection therapies in other practices.)  Very Rare: Severe mood changes; psychosis; aseptic necrosis. From  procedure: Some discomfort is to be expected once the numbing medicine wears off. This should be minimal if ice and heat are applied as instructed.  Call if: (When should I call?) You experience numbness and weakness that gets worse with time, as opposed to wearing off. New onset bowel or bladder incontinence. (Applies only to procedures done in the spine)  Emergency Numbers: Durning business hours (Monday - Thursday, 8:00 AM - 4:00 PM) (Friday, 9:00 AM - 12:00 Noon): [phone] After hours: [phone] NOTE: If you are having a problem and are unable connect with, or to talk to a provider, then go to your nearest urgent care or emergency department. If the problem is serious and urgent, please call 911. ____________________________________________________________________________________________    

## 2022-04-16 ENCOUNTER — Telehealth: Payer: Self-pay | Admitting: *Deleted

## 2022-04-16 NOTE — Telephone Encounter (Signed)
Attempted to call for post procedure follow-up. Message left. 

## 2022-04-18 DIAGNOSIS — Q2112 Patent foramen ovale: Secondary | ICD-10-CM | POA: Insufficient documentation

## 2022-04-29 ENCOUNTER — Ambulatory Visit: Payer: Medicaid Other | Attending: Pain Medicine | Admitting: Pain Medicine

## 2022-04-29 DIAGNOSIS — G8929 Other chronic pain: Secondary | ICD-10-CM

## 2022-04-29 DIAGNOSIS — Z7901 Long term (current) use of anticoagulants: Secondary | ICD-10-CM | POA: Diagnosis not present

## 2022-04-29 DIAGNOSIS — M545 Low back pain, unspecified: Secondary | ICD-10-CM | POA: Diagnosis not present

## 2022-04-29 DIAGNOSIS — M47816 Spondylosis without myelopathy or radiculopathy, lumbar region: Secondary | ICD-10-CM

## 2022-04-29 DIAGNOSIS — M961 Postlaminectomy syndrome, not elsewhere classified: Secondary | ICD-10-CM

## 2022-04-29 DIAGNOSIS — Z8673 Personal history of transient ischemic attack (TIA), and cerebral infarction without residual deficits: Secondary | ICD-10-CM

## 2022-04-29 NOTE — Progress Notes (Signed)
Patient: Claire Mcdonald  Service Category: E/M  Provider: [name], MD  DOB: [birth year]  DOS: 04/29/2022  Location: Office  MRN: [id number]  Setting: Ambulatory outpatient  Referring Provider: [name], MD  Type: Established Patient  Specialty: Interventional Pain Management  PCP: [name], MD  Location: Remote location  Delivery: TeleHealth     Virtual Encounter - Pain Management PROVIDER NOTE: Information contained herein reflects review and annotations entered in association with encounter. Interpretation of such information and data should be left to medically-trained personnel. Information provided to patient can be located elsewhere in the medical record under "Patient Instructions". Document created using STT-dictation technology, any transcriptional errors that may result from process are unintentional.    Contact & Pharmacy Preferred: [phone] Home: [phone] (home) Mobile: [phone] (mobile) E-mail: [name]  [place] [place] [address] [place] [address] Phone: [phone] Fax: [phone]  [facility] - [place], [place] - [address] [place] [address] Phone: [phone] Fax: [phone]   Pre-screening  Claire Mcdonald offered "in-person" vs "virtual" encounter. She indicated preferring virtual for this encounter.   Reason COVID-19*  Social distancing based on CDC and AMA recommendations.   I contacted KELSEE PRESLAR on 04/29/2022 via telephone.      I clearly identified myself as [name], MD. I verified that I was speaking with the correct person using two identifiers (Name: Claire Mcdonald, and date of birth: [birth year]).  Consent I sought verbal advanced consent from Claire Mcdonald for virtual visit interactions. I informed Claire Mcdonald of possible security and privacy concerns, risks, and limitations associated with providing "not-in-person" medical evaluation  and management services. I also informed Claire Mcdonald of the availability of "in-person" appointments. Finally, I informed her that there would be a charge for the virtual visit and that she could be  personally, fully or partially, financially responsible for it. Claire Mcdonald expressed understanding and agreed to proceed.   Historic Elements   Claire Mcdonald is a 56 y.o. year old, female patient evaluated today after our last contact on 04/15/2022. 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 She also  has a past surgical history that includes Tubal ligation (1998); Ovarian cyst removal (Left); central line (03/25/2015); Hand surgery (Left); Cardiac catheterization (N/A, 03/27/2015); Cardiac catheterization (N/A, 04/11/2015); Cardiac catheterization (N/A, 07/16/2015); Colonoscopy with propofol (N/A, 10/01/2015); Esophagogastroduodenoscopy (egd) with propofol (N/A, 10/01/2015); Abdominal hysterectomy; and Hip Arthroplasty (Right, 02/27/2020). Claire Mcdonald has a current medication list which includes the following prescription(s): acetaminophen, albuterol, atorvastatin, vitamin d3, furosemide, gabapentin, isosorbide mononitrate, ketoconazole, lisinopril, magnesium, metoprolol tartrate, polyethylene glycol, rivaroxaban, senna-docusate, sertraline, topiramate, triamcinolone, azelastine, gnp calcium 1200, and famotidine. She  reports that she has never smoked. She has never used smokeless tobacco. She reports that she does not drink alcohol and does not use drugs. Claire Mcdonald is allergic to tape,  iodinated contrast media, toradol [ketorolac tromethamine], tramadol, and zofran [ondansetron hcl].   HPI  Today, she is being contacted for a post-procedure assessment.  The patient indicated having attained 100% relief of the pain for the duration of the  local anesthetic, followed by a decrease to only a 50% improvement which later went up to an ongoing 100% relief of the pain, bilaterally.  This type of therapy continues to be very effective in controlling the patient's PRN flareups.  Because she has considerable amount of hardware in the lumbar spine, she is not a good candidate for radiofrequency ablation.  Post-procedure evaluation   Type: Lumbar Facet, Medial Branch Block(s)  R10L9   Laterality: Bilateral  Level: L2, L3, L4, L5, & S1 Medial Branch Level(s). Injecting these levels blocks the L3-4, L4-5 and L5-S1 lumbar facet joints. Imaging: Fluoroscopic guidance         Anesthesia: Local anesthesia (1-2% Lidocaine) Anxiolysis: IV Versed         Sedation: Moderate Sedation              .  DOS: 04/15/2022 Performed by: [name], MD  Primary Purpose: Diagnostic/Therapeutic Indications: Low back pain severe enough to impact quality of life or function. 1. Lumbar facet syndrome (Bilateral) (R>L)   2. Spondylosis without myelopathy or radiculopathy, lumbosacral region   3. 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 Grade 2-3 (13-16 mm) Anterolisthesis of L5/S1 (06/22/2019)   5. DDD (degenerative disc disease), lumbosacral   6. Failed back surgical syndrome   7. 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 Subsequent 4-6 hours post-procedure: 100 %. Analgesia past  initial 6 hours: 50 % (ongoing). Ongoing improvement:  Analgesic: The patient indicates currently having an ongoing 100% relief of the pain on both sides. Function: Claire Mcdonald reports improvement in function ROM: Claire Mcdonald reports improvement in ROM  Pharmacotherapy Assessment   Opioid Analgesic: No opioid analgesics prescribed by our practice.  The patient received a letter from [facility] indicating that they would not be paying for her oxycodone prescriptions.   Monitoring: [place] PMP: PDMP reviewed during this encounter.       Pharmacotherapy: No side-effects or adverse reactions reported. Compliance: No problems identified. Effectiveness: Clinically acceptable. Plan: Refer to "POC". UDS: No results found for: "SUMMARY" No results found for: "CBDTHCR", "D8THCCBX", "D9THCCBX"   Laboratory Chemistry Profile   Renal Lab Results  Component Value Date   BUN 15 12/21/2021   CREATININE 0.59 12/21/2021   BCR 21 04/16/2015   GFRAA >60 03/01/2020   GFRNONAA >60 12/21/2021    Hepatic Lab Results  Component Value Date   AST 20 12/21/2021   ALT 7 12/21/2021   ALBUMIN 3.8 12/21/2021   ALKPHOS 54 12/21/2021   LIPASE 26 12/21/2021    Electrolytes Lab Results  Component Value Date   NA 139 12/21/2021   K 4.1 12/21/2021   CL 106 12/21/2021   CALCIUM 9.2 12/21/2021   MG 2.3 12/21/2021   PHOS 3.8 02/19/2017    Bone Lab Results  Component Value Date   VD25OH 19.3 (L) 11/30/2017    Inflammation (CRP: Acute Phase) (ESR: Chronic Phase) Lab Results  Component Value Date   CRP <0.8 11/30/2017   ESRSEDRATE 19 11/30/2017   LATICACIDVEN 0.8 03/26/2015         Note: Above Lab results reviewed.  Imaging  DG PAIN CLINIC C-ARM  1-60 MIN NO REPORT Fluoro was used, but no Radiologist interpretation will be provided.  Please refer to "NOTES" tab for provider progress note.  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 Diagnoses of Lumbar  facet syndrome (Bilateral) (R>L), Failed back surgical syndrome, Chronic anticoagulation (Xarelto), and History of CVA (cerebrovascular accident) were also pertinent to this visit.  Plan of Care  Problem-specific:  No problem-specific Assessment & Plan notes found for this encounter.  Claire Mcdonald has a current medication list which includes the following long-term medication(s): albuterol, atorvastatin, furosemide, isosorbide mononitrate, metoprolol tartrate, rivaroxaban, topiramate, azelastine, gnp calcium 1200, and famotidine.  Pharmacotherapy (Medications Ordered): No orders of the defined types were placed in this encounter.  Orders:  No orders of the defined types were placed in this encounter.  Follow-up plan:   Return if symptoms worsen or fail to improve.     Interventional Therapies  Risk  Complexity Considerations:   Estimated body mass index is 31.83 kg/m as calculated from the following:   Height as of 05/28/21: 5' 2" (1.575 m).   Weight as of 05/28/21: 174 lb (78.9 kg).   Anticoagulation: Xarelto. (Stop: 3 days  Restart: 6 hrs)  Allergy: Iodine and radiological contrast  Implant: Internal cardiac defibrillator  History of noncompliance with post procedure follow-ups. (Prior patient of Dr. Mohammed Kindle) NOT a candidate for Lumbar RFA 2ry to Fusion Hardware.   Planned  Pending:   Palliative bilateral lumbar facet MBB #R10L9    Under consideration:   She definitely does not want to proceed with the pump. (09/02/2021)    Completed:   Therapeutic left lumbar facet MBB x8 (12/19/2021) (100/100/100/100)  Therapeutic right lumbar facet MBB x9 (12/19/2021) (100/100/100/100)  Therapeutic bilateral L5 TFESI x2 (05/31/2019) (80/80/80/>75)  Therapeutic right lumbar facet RFA x1 (02/07/2020) (DO NOT REPEAT - FUSION hardware)   Completed by other providers:   Diagnostic right lumbar facet MBB x1 (03/12/2015) (n/a) by Dr. Mohammed Kindle  Diagnostic/therapeutic right SI  joint NB x3 (02/05/2015) (n/a) by Dr. Mohammed Kindle  Lumbar spine surgery: ALIF L4-5, PSF L4-S1 (Dr. Elayne Guerin) (09/29/2019)    Therapeutic  Palliative (PRN) options:   Palliative lumbar facet MBB (for low back pain)  Palliative L5 TFESI (for radicular lower extremity pain)       Recent Visits Date Type Provider Dept  04/15/22 Procedure visit Milinda Pointer, MD [facility]  04/07/22 Office Visit Milinda Pointer, MD [facility]  Showing recent visits within past 90 days and meeting all other requirements Today's Visits Date Type Provider Dept  04/29/22 Office Visit Milinda Pointer, MD [facility]  Showing today's visits and meeting all other requirements Future Appointments No visits were found meeting these conditions. Showing future appointments within next 90 days and meeting all other requirements  I discussed the assessment and treatment plan with the patient. The patient was provided an opportunity to ask questions and all were answered. The patient agreed with the plan and demonstrated an understanding of the instructions.  Patient advised to call back or seek an in-person evaluation if the symptoms or condition worsens.  Duration of encounter: 12 minutes.  Note by: [name], MD Date: 04/29/2022; Time: 9:29 AM

## 2022-07-13 NOTE — Progress Notes (Unsigned)
Patient: Claire Mcdonald  Service Category: E/M  Provider: [name], MD  DOB: [birth year]  DOS: 07/14/2022  Location: Office  MRN: [id number]  Setting: Ambulatory outpatient  Referring Provider: [name], MD  Type: Established Patient  Specialty: Interventional Pain Management  PCP: [name], MD  Location: Remote location  Delivery: TeleHealth     Virtual Encounter - Pain Management PROVIDER NOTE: Information contained herein reflects review and annotations entered in association with encounter. Interpretation of such information and data should be left to medically-trained personnel. Information provided to patient can be located elsewhere in the medical record under "Patient Instructions". Document created using STT-dictation technology, any transcriptional errors that may result from process are unintentional.    Contact & Pharmacy Preferred: [phone] Home: [phone] (home) Mobile: [phone] (mobile) E-mail: [name]  [place], [place] - [place] [address] Phone: [phone] Fax: [phone]  [facility] - [place], [place] - [address] [place] [address] Phone: [phone] Fax: [phone]   Pre-screening  Claire Mcdonald offered "in-person" vs "virtual" encounter. She indicated preferring virtual for this encounter.   Reason COVID-19*  Social distancing based on CDC and AMA recommendations.   I contacted Claire Mcdonald on 07/14/2022 via telephone.      I clearly identified myself as [name], MD. I verified that I was speaking with the correct person using two identifiers (Name: Claire Mcdonald, and date of birth: May 30, 1966).  Consent I sought verbal advanced consent from Claire Mcdonald for virtual visit interactions. I informed Claire Mcdonald of possible security and privacy concerns, risks, and limitations associated with providing "not-in-person" medical  evaluation and management services. I also informed Claire Mcdonald of the availability of "in-person" appointments. Finally, I informed her that there would be a charge for the virtual visit and that she could be  personally, fully or partially, financially responsible for it. Claire Mcdonald expressed understanding and agreed to proceed.   Historic Elements   Claire Mcdonald is a 56 y.o. year old, female patient evaluated today after our last contact on 04/29/2022. 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 She also  has a past surgical history that includes Tubal ligation (1998); Ovarian cyst removal (Left); central line (03/25/2015); Hand surgery (Left); Cardiac catheterization (N/A, 03/27/2015); Cardiac catheterization (N/A, 04/11/2015); Cardiac catheterization (N/A, 07/16/2015); Colonoscopy with propofol (N/A, 10/01/2015); Esophagogastroduodenoscopy (egd) with propofol (N/A, 10/01/2015); Abdominal hysterectomy; and Hip Arthroplasty (Right, 02/27/2020). Claire Mcdonald has a current medication list which includes the following prescription(s): acetaminophen, albuterol, atorvastatin, azelastine, gnp calcium 1200, vitamin d3, diclofenac sodium, famotidine, furosemide, gabapentin, isosorbide mononitrate, ketoconazole, lisinopril, magnesium, metoprolol tartrate, polyethylene glycol, potassium chloride sa, rivaroxaban, senna-docusate, sertraline, topiramate, trazodone, and triamcinolone. She  reports that she has never smoked. She has never used smokeless tobacco. She reports that she does not drink alcohol  and does not use drugs. Claire Mcdonald is allergic to tape, iodinated contrast media, toradol [ketorolac tromethamine], tramadol, and zofran [ondansetron hcl].   HPI  Today, she is being contacted for worsening of previously known (established) problem.  Today the patient called indicating that  her low back pain has returned.  Currently the pain in the lower back is bilateral with the right side being worse than the left.  She also describes having some referred pain down the legs with the right being worse than the left.  The pain is primarily in the buttocks area on the right side and travels down through the back of the leg to the level of the knee.  In the case of the left side it is exactly the same but it only goes down halfway.  In the past she has had identical symptoms which we have treated with palliative bilateral lumbar facet blocks which provide her with 100% relief of the pain for approximately 4 months at a time.  The patient indicates that it helps her enough that she does not need to take any pain medications.  Pharmacotherapy Assessment   Opioid Analgesic: No opioid analgesics prescribed by our practice.  The patient received a letter from [facility] indicating that they would not be paying for her oxycodone prescriptions.   Monitoring: [place] PMP: PDMP reviewed during this encounter.       Pharmacotherapy: No side-effects or adverse reactions reported. Compliance: No problems identified. Effectiveness: Clinically acceptable. Plan: Refer to "POC". UDS: No results found for: "SUMMARY" No results found for: "CBDTHCR", "D8THCCBX", "D9THCCBX"   Laboratory Chemistry Profile   Renal Lab Results  Component Value Date   BUN 15 12/21/2021   CREATININE 0.59 12/21/2021   BCR 21 04/16/2015   GFRAA >60 03/01/2020   GFRNONAA >60 12/21/2021    Hepatic Lab Results  Component Value Date   AST 20 12/21/2021   ALT 7 12/21/2021   ALBUMIN 3.8 12/21/2021   ALKPHOS 54 12/21/2021   LIPASE 26  12/21/2021    Electrolytes Lab Results  Component Value Date   NA 139 12/21/2021   K 4.1 12/21/2021   CL 106 12/21/2021   CALCIUM 9.2 12/21/2021   MG 2.3 12/21/2021   PHOS 3.8 02/19/2017    Bone Lab Results  Component Value Date   VD25OH 19.3 (L) 11/30/2017    Inflammation (CRP: Acute Phase) (ESR: Chronic Phase) Lab Results  Component Value Date   CRP <0.8 11/30/2017   ESRSEDRATE 19 11/30/2017   LATICACIDVEN 0.8 03/26/2015         Note: Above Lab results reviewed.  Imaging  DG PAIN CLINIC C-ARM 1-60 MIN NO REPORT Fluoro was used, but no Radiologist interpretation will be provided.  Please refer to "NOTES" tab for provider progress note.  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 Diagnoses of Lumbar facet syndrome (Bilateral) (R>L), Grade 2-3 (13-16 mm) Anterolisthesis of L5/S1 (06/22/2019), Lumbar facet arthropathy, Spondylosis without myelopathy or radiculopathy, lumbosacral region, DDD (degenerative disc disease), lumbosacral, Failed back surgical syndrome, Chronic anticoagulation (Xarelto), and History of implantable cardioverter-defibrillator (ICD) insertion were also pertinent to this visit.  Plan of Care  Problem-specific:  No problem-specific Assessment & Plan notes found for this encounter.  Claire Mcdonald has a current medication list which includes the following long-term medication(s): albuterol, atorvastatin, azelastine, gnp calcium 1200, famotidine, furosemide, isosorbide mononitrate, metoprolol tartrate, rivaroxaban, and topiramate.  Pharmacotherapy (Medications Ordered): No orders of the defined types were placed in this encounter.  Orders:  Orders Placed This Encounter  Procedures   LUMBAR FACET(MEDIAL BRANCH NERVE BLOCK) MBNB    Standing Status:   Future    Standing Expiration Date:   10/14/2022    Scheduling Instructions:  Procedure: Lumbar facet block (AKA.: Lumbosacral medial branch nerve  block)     Side: Bilateral     Level: L3-4 & L5-S1 Facets (L2, L3, L4, L5, & S1 Medial Branch Nerves)     Sedation: Patient's choice.     Timeframe: ASAA    Order Specific Question:   Where will this procedure be performed?    Answer:   [facility]   Blood Thinner Instructions to Nursing    Always make sure patient has clearance from prescribing physician to stop blood thinners for interventional therapies. If the patient requires a Lovenox-bridge therapy, make sure arrangements are made to institute it with the assistance of the PCP.    Scheduling Instructions:     Have Claire Mcdonald stop the Xarelto (Rivaroxaban) x 3 days prior to procedure or surgery.   Follow-up plan:   Return for [facility]): (B) L-FCT Blk, (Blood Thinner Protocol).     Interventional Therapies  Risk  Complexity Considerations:   Estimated body mass index is 31.83 kg/m as calculated from the following:   Height as of 05/28/21: _0  (1.575 m).   Weight as of 05/28/21: 174 lb (78.9 kg).   Anticoagulation: Xarelto. (Stop: 3 days  Restart: 6 hrs)  Allergy: Iodine and radiological contrast  Implant: Internal cardiac defibrillator  History of noncompliance with post procedure follow-ups. (Prior patient of Dr. Mohammed Kindle) NOT a candidate for Lumbar RFA 2ry to Fusion Hardware & implanted Cardiac Defibrillator.   Planned  Pending:   Palliative bilateral lumbar facet MBB #[id number]    Under consideration:   She definitely does not want to proceed with the pump. (09/02/2021)    Completed:   Therapeutic left lumbar facet MBB x8 (12/19/2021) (100/100/100/100)  Therapeutic right lumbar facet MBB x9 (12/19/2021) (100/100/100/100)  Therapeutic bilateral L5 TFESI x2 (05/31/2019) (80/80/80/>75)  Therapeutic right lumbar facet RFA x1 (02/07/2020) (DO NOT REPEAT - FUSION hardware)   Completed by other providers:   Diagnostic right lumbar facet MBB x1 (03/12/2015) (n/a) by Dr. Mohammed Kindle  Diagnostic/therapeutic right  SI joint NB x3 (02/05/2015) (n/a) by Dr. Mohammed Kindle  Lumbar spine surgery: ALIF L4-5, PSF L4-S1 (Dr. Elayne Guerin) (09/29/2019)    Therapeutic  Palliative (PRN) options:   Palliative lumbar facet MBB (for low back pain)  Palliative L5 TFESI (for radicular lower extremity pain)     Recent Visits Date Type Provider Dept  04/29/22 Office Visit Milinda Pointer, [facility]  04/15/22 Procedure visit Milinda Pointer, MD [facility]  Showing recent visits within past 90 days and meeting all other requirements Today's Visits Date Type Provider Dept  07/14/22 Office Visit Milinda Pointer, MD [facility]  Showing today's visits and meeting all other requirements Future Appointments No visits were found meeting these conditions. Showing future appointments within next 90 days and meeting all other requirements  I discussed the assessment and treatment plan with the patient. The patient was provided an opportunity to ask questions and all were answered. The patient agreed with the plan and demonstrated an understanding of the instructions.  Patient advised to call back or seek an in-person evaluation if the symptoms or condition worsens.  Duration of encounter: 16 minutes.  Note by: [name], MD Date: 07/14/2022; Time: 5:00 PM

## 2022-07-14 ENCOUNTER — Ambulatory Visit: Payer: Medicaid Other | Attending: Pain Medicine | Admitting: Pain Medicine

## 2022-07-14 DIAGNOSIS — M47816 Spondylosis without myelopathy or radiculopathy, lumbar region: Secondary | ICD-10-CM

## 2022-07-14 DIAGNOSIS — M545 Low back pain, unspecified: Secondary | ICD-10-CM

## 2022-07-14 DIAGNOSIS — G8929 Other chronic pain: Secondary | ICD-10-CM

## 2022-07-14 DIAGNOSIS — M5137 Other intervertebral disc degeneration, lumbosacral region: Secondary | ICD-10-CM

## 2022-07-14 DIAGNOSIS — M47817 Spondylosis without myelopathy or radiculopathy, lumbosacral region: Secondary | ICD-10-CM

## 2022-07-14 DIAGNOSIS — M961 Postlaminectomy syndrome, not elsewhere classified: Secondary | ICD-10-CM

## 2022-07-14 DIAGNOSIS — M4310 Spondylolisthesis, site unspecified: Secondary | ICD-10-CM

## 2022-07-14 DIAGNOSIS — Z95810 Presence of automatic (implantable) cardiac defibrillator: Secondary | ICD-10-CM

## 2022-07-14 DIAGNOSIS — Z7901 Long term (current) use of anticoagulants: Secondary | ICD-10-CM

## 2022-07-14 NOTE — Patient Instructions (Signed)
____________________________________________________________________________________________  Blood Thinners  IMPORTANT NOTICE:  If you take any of these, make sure to notify the nursing staff.  Failure to do so may result in injury.  Recommended time intervals to stop and restart blood-thinners, before & after invasive procedures  Generic Name Brand Name Pre-procedure. Stop this long before procedure. Post-procedure. Minimum waiting period before restarting.  Abciximab Reopro 15 days 2 hrs  Alteplase Activase 10 days 10 days  Anagrelide Agrylin    Apixaban Eliquis 3 days 6 hrs  Cilostazol Pletal 3 days 5 hrs  Clopidogrel Plavix 7-10 days 2 hrs  Dabigatran Pradaxa 5 days 6 hrs  Dalteparin Fragmin 24 hours 4 hrs  Dipyridamole Aggrenox 11days 2 hrs  Edoxaban Lixiana; Savaysa 3 days 2 hrs  Enoxaparin  Lovenox 24 hours 4 hrs  Eptifibatide Integrillin 8 hours 2 hrs  Fondaparinux  Arixtra 72 hours 12 hrs  Hydroxychloroquine Plaquenil 11 days   Prasugrel Effient 7-10 days 6 hrs  Reteplase Retavase 10 days 10 days  Rivaroxaban Xarelto 3 days 6 hrs  Ticagrelor Brilinta 5-7 days 6 hrs  Ticlopidine Ticlid 10-14 days 2 hrs  Tinzaparin Innohep 24 hours 4 hrs  Tirofiban Aggrastat 8 hours 2 hrs  Warfarin Coumadin 5 days 2 hrs   Other medications with blood-thinning effects  Product indications Generic (Brand) names Note  Cholesterol Lipitor Stop 4 days before procedure  Blood thinner (injectable) Heparin (LMW or LMWH Heparin) Stop 24 hours before procedure  Cancer Ibrutinib (Imbruvica) Stop 7 days before procedure  Malaria/Rheumatoid Hydroxychloroquine (Plaquenil) Stop 11 days before procedure  Thrombolytics  10 days before or after procedures   Over-the-counter (OTC) Products with blood-thinning effects  Product Common names Stop Time  Aspirin > 325 mg Goody Powders, Excedrin, etc. 11 days  Aspirin ? 81 mg  7 days  Fish oil  4 days  Garlic supplements  7 days  Ginkgo biloba  36  hours  Ginseng  24 hours  NSAIDs Ibuprofen, Naprosyn, etc. 3 days  Vitamin E  4 days   ____________________________________________________________________________________________   ______________________________________________________________________  Preparing for your procedure  During your procedure appointment there will be: No Prescription Refills. No disability issues to discussed. No medication changes or discussions.  Instructions: Food intake: Avoid eating anything solid for at least 8 hours prior to your procedure. Clear liquid intake: You may take clear liquids such as water up to 2 hours prior to your procedure. (No carbonated drinks. No soda.) Transportation: Unless otherwise stated by your physician, bring a driver. Morning Medicines: Except for blood thinners, take all of your other morning medications with a sip of water. Make sure to take your heart and blood pressure medicines. If your blood pressure's lower number is above 100, the case will be rescheduled. Blood thinners: If you take a blood thinner, but were not instructed to stop it, call our office [phone] and ask to talk to a nurse. Not stopping a blood thinner prior to certain procedures could lead to serious complications. Diabetics on insulin: Notify the staff so that you can be scheduled 1st case in the morning. If your diabetes requires high dose insulin, take only  of your normal insulin dose the morning of the procedure and notify the staff that you have done so. Preventing infections: Shower with an antibacterial soap the morning of your procedure.  Build-up your immune system: Take 1000 mg of Vitamin C with every meal (3 times a day) the day prior to your procedure. Antibiotics: Inform the nursing staff   if you are taking any antibiotics or if you have any conditions that may require antibiotics prior to procedures. (Example: recent joint implants)   Pregnancy: If you are pregnant make sure to  notify the nursing staff. Not doing so may result in injury to the fetus, including death.  Sickness: If you have a cold, fever, or any active infections, call and cancel or reschedule your procedure. Receiving steroids while having an infection may result in complications. Arrival: You must be in the facility at least 30 minutes prior to your scheduled procedure. Tardiness: Your scheduled time is also the cutoff time. If you do not arrive at least 15 minutes prior to your procedure, you will be rescheduled.  Children: Do not bring any children with you. Make arrangements to keep them home. Dress appropriately: There is always a possibility that your clothing may get soiled. Avoid long dresses. Valuables: Do not bring any jewelry or valuables.  Reasons to call and reschedule or cancel your procedure: (Following these recommendations will minimize the risk of a serious complication.) Surgeries: Avoid having procedures within 2 weeks of any surgery. (Avoid for 2 weeks before or after any surgery). Flu Shots: Avoid having procedures within 2 weeks of a flu shots or . (Avoid for 2 weeks before or after immunizations). Barium: Avoid having a procedure within 7-10 days after having had a radiological study involving the use of radiological contrast. (Myelograms, Barium swallow or enema study). Heart attacks: Avoid any elective procedures or surgeries for the initial 6 months after a "Myocardial Infarction" (Heart Attack). Blood thinners: It is imperative that you stop these medications before procedures. Let us know if you if you take any blood thinner.  Infection: Avoid procedures during or within two weeks of an infection (including chest colds or gastrointestinal problems). Symptoms associated with infections include: Localized redness, fever, chills, night sweats or profuse sweating, burning sensation when voiding, cough, congestion, stuffiness, runny nose, sore throat, diarrhea, nausea, vomiting, cold  or Flu symptoms, recent or current infections. It is specially important if the infection is over the area that we intend to treat. Heart and lung problems: Symptoms that may suggest an active cardiopulmonary problem include: cough, chest pain, breathing difficulties or shortness of breath, dizziness, ankle swelling, uncontrolled high or unusually low blood pressure, and/or palpitations. If you are experiencing any of these symptoms, cancel your procedure and contact your primary care physician for an evaluation.  Remember:  Regular Business hours are:  Monday to Thursday 8:00 AM to 4:00 PM  Provider's Schedule: Reilly Blades, MD:  Procedure days: Tuesday and Thursday 7:30 AM to 4:00 PM  Bilal Lateef, MD:  Procedure days: Monday and Wednesday 7:30 AM to 4:00 PM  ______________________________________________________________________    ____________________________________________________________________________________________  General Risks and Possible Complications  Patient Responsibilities: It is important that you read this as it is part of your informed consent. It is our duty to inform you of the risks and possible complications associated with treatments offered to you. It is your responsibility as a patient to read this and to ask questions about anything that is not clear or that you believe was not covered in this document.  Patient's Rights: You have the right to refuse treatment. You also have the right to change your mind, even after initially having agreed to have the treatment done. However, under this last option, if you wait until the last second to change your mind, you may be charged for the materials used up to that point.    Introduction: Medicine is not an exact science. Everything in Medicine, including the lack of treatment(s), carries the potential for danger, harm, or loss (which is by definition: Risk). In Medicine, a complication is a secondary problem,  condition, or disease that can aggravate an already existing one. All treatments carry the risk of possible complications. The fact that a side effects or complications occurs, does not imply that the treatment was conducted incorrectly. It must be clearly understood that these can happen even when everything is done following the highest safety standards.  No treatment: You can choose not to proceed with the proposed treatment alternative. The "PRO(s)" would include: avoiding the risk of complications associated with the therapy. The "CON(s)" would include: not getting any of the treatment benefits. These benefits fall under one of three categories: diagnostic; therapeutic; and/or palliative. Diagnostic benefits include: getting information which can ultimately lead to improvement of the disease or symptom(s). Therapeutic benefits are those associated with the successful treatment of the disease. Finally, palliative benefits are those related to the decrease of the primary symptoms, without necessarily curing the condition (example: decreasing the pain from a flare-up of a chronic condition, such as incurable terminal cancer).  General Risks and Complications: These are associated to most interventional treatments. They can occur alone, or in combination. They fall under one of the following six (6) categories: no benefit or worsening of symptoms; bleeding; infection; nerve damage; allergic reactions; and/or death. No benefits or worsening of symptoms: In Medicine there are no guarantees, only probabilities. No healthcare provider can ever guarantee that a medical treatment will work, they can only state the probability that it may. Furthermore, there is always the possibility that the condition may worsen, either directly, or indirectly, as a consequence of the treatment. Bleeding: This is more common if the patient is taking a blood thinner, either prescription or over the counter (example: Goody Powders,  Fish oil, Aspirin, Garlic, etc.), or if suffering a condition associated with impaired coagulation (example: Hemophilia, cirrhosis of the liver, low platelet counts, etc.). However, even if you do not have one on these, it can still happen. If you have any of these conditions, or take one of these drugs, make sure to notify your treating physician. Infection: This is more common in patients with a compromised immune system, either due to disease (example: diabetes, cancer, human immunodeficiency virus [HIV], etc.), or due to medications or treatments (example: therapies used to treat cancer and rheumatological diseases). However, even if you do not have one on these, it can still happen. If you have any of these conditions, or take one of these drugs, make sure to notify your treating physician. Nerve Damage: This is more common when the treatment is an invasive one, but it can also happen with the use of medications, such as those used in the treatment of cancer. The damage can occur to small secondary nerves, or to large primary ones, such as those in the spinal cord and brain. This damage may be temporary or permanent and it may lead to impairments that can range from temporary numbness to permanent paralysis and/or brain death. Allergic Reactions: Any time a substance or material comes in contact with our body, there is the possibility of an allergic reaction. These can range from a mild skin rash (contact dermatitis) to a severe systemic reaction (anaphylactic reaction), which can result in death. Death: In general, any medical intervention can result in death, most of the time due to an unforeseen complication. ____________________________________________________________________________________________    

## 2022-07-19 NOTE — Progress Notes (Signed)
PROVIDER NOTE: Interpretation of information contained herein should be left to medically-trained personnel. Specific patient instructions are provided elsewhere under "Patient Instructions" section of medical record. This document was created in part using STT-dictation technology, any transcriptional errors that may result from this process are unintentional.  Patient: Claire Mcdonald Type: Established DOB: [birth year] MRN: [id number] PCP: [name], MD  Service: Procedure DOS: 07/24/2022 Setting: Ambulatory Location: Ambulatory outpatient facility Delivery: Face-to-face Provider: [name], MD Specialty: Interventional Pain Management Specialty designation: 09 Location: Outpatient facility Ref. Prov.: [name], MD    Procedure:           Type: Lumbar Facet, Medial Branch Block(s) #R10L9   Laterality: Bilateral  Level: L2, L3, L4, L5, & S1 Medial Branch Level(s). Injecting these levels blocks the L3-4, L4-5 and L5-S1 lumbar facet joints. Imaging: Fluoroscopic guidance         Anesthesia: Local anesthesia (1-2% Lidocaine) Anxiolysis: IV Versed 2.0 mg Sedation: No Sedation                       DOS: 07/24/2022 Performed by: [name], MD  Primary Purpose: Diagnostic/Therapeutic Indications: Low back pain severe enough to impact quality of life or function. 1. Lumbar facet syndrome (Bilateral) (R>L)   2. Spondylosis without myelopathy or radiculopathy, lumbosacral region   3. Lumbar facet arthropathy   4. Pars defect of lumbar spine   5. Grade 2-3 (13-16 mm) Anterolisthesis of L5/S1 (06/22/2019)   6. DDD (degenerative disc disease), lumbosacral   7. Failed back surgical syndrome   8. Chronic low back pain (Bilateral) w/o sciatica   9. Abnormal CT scan, lumbar spine (02/25/2020)   10. Chronic anticoagulation (Xarelto)    NAS-11 Pain score:   Pre-procedure: 8 /10   Post-procedure: 0-No pain/10     Position / Prep / Materials:  Position: Prone  Prep  solution: DuraPrep (Iodine Povacrylex [0.7% available iodine] and Isopropyl Alcohol, 74% w/w) Area Prepped: Posterolateral Lumbosacral Spine (Wide prep: From the lower border of the scapula down to the end of the tailbone and from flank to flank.)  Materials:  Tray: Block Needle(s):  Type: Spinal  Gauge (G): 22  Length: 3.5-in Qty: 4     Pre-op H&P Assessment:  Claire Mcdonald is a 56 y.o. (year old), female patient, seen today for interventional treatment. She  has a past surgical history that includes Tubal ligation (1998); Ovarian cyst removal (Left); central line (03/25/2015); Hand surgery (Left); Cardiac catheterization (N/A, 03/27/2015); Cardiac catheterization (N/A, 04/11/2015); Cardiac catheterization (N/A, 07/16/2015); Colonoscopy with propofol (N/A, 10/01/2015); Esophagogastroduodenoscopy (egd) with propofol (N/A, 10/01/2015); Abdominal hysterectomy; and Hip Arthroplasty (Right, 02/27/2020). Claire Mcdonald has a current medication list which includes the following prescription(s): acetaminophen, albuterol, atorvastatin, vitamin d3, diclofenac sodium, furosemide, gabapentin, isosorbide mononitrate, ketoconazole, lisinopril, magnesium, metoprolol tartrate, polyethylene glycol, potassium chloride sa, rivaroxaban, senna-docusate, sertraline, topiramate, trazodone, triamcinolone, azelastine, gnp calcium 1200, and famotidine. Her primarily concern today is the Back Pain (lower)  Initial Vital Signs:  Pulse/HCG Rate: 90ECG Heart Rate: 86 Temp: (!) 97.2 F (36.2 C) Resp: 16 BP: (!) 155/119 (Dr Dossie Arbour notified) SpO2: 100 %  BMI: Estimated body mass index is 28.34 kg/m as calculated from the following:   Height as of this encounter: '5\' 1"'$  (1.549 m).   Weight as of this encounter: 150 lb (68 kg).  Risk Assessment: Allergies: Reviewed. She is allergic to tape, iodinated contrast media, toradol [ketorolac tromethamine], tramadol, and zofran [ondansetron hcl].  Allergy Precautions:  None required Coagulopathies:  Reviewed. None identified.  Blood-thinner therapy: None at this time Active Infection(s): Reviewed. None identified. Claire Mcdonald is afebrile  Site Confirmation: Claire Mcdonald was asked to confirm the procedure and laterality before marking the site Procedure checklist: Completed Consent: Before the procedure and under the influence of no sedative(s), amnesic(s), or anxiolytics, the patient was informed of the treatment options, risks and possible complications. To fulfill our ethical and legal obligations, as recommended by the American Medical Association's Code of Ethics, I have informed the patient of my clinical impression; the nature and purpose of the treatment or procedure; the risks, benefits, and possible complications of the intervention; the alternatives, including doing nothing; the risk(s) and benefit(s) of the alternative treatment(s) or procedure(s); and the risk(s) and benefit(s) of doing nothing. The patient was provided information about the general risks and possible complications associated with the procedure. These may include, but are not limited to: failure to achieve desired goals, infection, bleeding, organ or nerve damage, allergic reactions, paralysis, and death. In addition, the patient was informed of those risks and complications associated to Spine-related procedures, such as failure to decrease pain; infection (i.e.: Meningitis, epidural or intraspinal abscess); bleeding (i.e.: epidural hematoma, subarachnoid hemorrhage, or any other type of intraspinal or peri-dural bleeding); organ or nerve damage (i.e.: Any type of peripheral nerve, nerve root, or spinal cord injury) with subsequent damage to sensory, motor, and/or autonomic systems, resulting in permanent pain, numbness, and/or weakness of one or several areas of the body; allergic reactions; (i.e.: anaphylactic reaction); and/or death. Furthermore, the patient was informed of those risks and complications associated with the  medications. These include, but are not limited to: allergic reactions (i.e.: anaphylactic or anaphylactoid reaction(s)); adrenal axis suppression; blood sugar elevation that in diabetics may result in ketoacidosis or comma; water retention that in patients with history of congestive heart failure may result in shortness of breath, pulmonary edema, and decompensation with resultant heart failure; weight gain; swelling or edema; medication-induced neural toxicity; particulate matter embolism and blood vessel occlusion with resultant organ, and/or nervous system infarction; and/or aseptic necrosis of one or more joints. Finally, the patient was informed that Medicine is not an exact science; therefore, there is also the possibility of unforeseen or unpredictable risks and/or possible complications that may result in a catastrophic outcome. The patient indicated having understood very clearly. We have given the patient no guarantees and we have made no promises. Enough time was given to the patient to ask questions, all of which were answered to the patient's satisfaction. Ms. Anastos has indicated that she wanted to continue with the procedure. Attestation: I, the ordering provider, attest that I have discussed with the patient the benefits, risks, side-effects, alternatives, likelihood of achieving goals, and potential problems during recovery for the procedure that I have provided informed consent. Date  Time: 07/24/2022 10:39 AM  Pre-Procedure Preparation:  Monitoring: As per clinic protocol. Respiration, ETCO2, SpO2, BP, heart rate and rhythm monitor placed and checked for adequate function Safety Precautions: Patient was assessed for positional comfort and pressure points before starting the procedure. Time-out: I initiated and conducted the "Time-out" before starting the procedure, as per protocol. The patient was asked to participate by confirming the accuracy of the "Time Out" information. Verification  of the correct person, site, and procedure were performed and confirmed by me, the nursing staff, and the patient. "Time-out" conducted as per Joint Commission's Universal Protocol (UP.01.01.01). Time: 1106  Description of Procedure:  Laterality: Bilateral. The procedure was performed in identical fashion on both sides. Targeted Levels:  L2, L3, L4, L5, & S1 Medial Branch Level(s)  Safety Precautions: Aspiration looking for blood return was conducted prior to all injections. At no point did we inject any substances, as a needle was being advanced. Before injecting, the patient was told to immediately notify me if she was experiencing any new onset of "ringing in the ears, or metallic taste in the mouth". No attempts were made at seeking any paresthesias. Safe injection practices and needle disposal techniques used. Medications properly checked for expiration dates. SDV (single dose vial) medications used. After the completion of the procedure, all disposable equipment used was discarded in the proper designated medical waste containers. Local Anesthesia: Protocol guidelines were followed. The patient was positioned over the fluoroscopy table. The area was prepped in the usual manner. The time-out was completed. The target area was identified using fluoroscopy. A 12-in long, straight, sterile hemostat was used with fluoroscopic guidance to locate the targets for each level blocked. Once located, the skin was marked with an approved surgical skin marker. Once all sites were marked, the skin (epidermis, dermis, and hypodermis), as well as deeper tissues (fat, connective tissue and muscle) were infiltrated with a small amount of a short-acting local anesthetic, loaded on a 10cc syringe with a 25G, 1.5-in  Needle. An appropriate amount of time was allowed for local anesthetics to take effect before proceeding to the next step. Local Anesthetic: Lidocaine 2.0% The unused portion of the local anesthetic  was discarded in the proper designated containers. Technical description of process:  L2 Medial Branch Nerve Block (MBB): The target area for the L2 medial branch is at the junction of the postero-lateral aspect of the superior articular process and the superior, posterior, and medial edge of the transverse process of L3. Under fluoroscopic guidance, a Quincke needle was inserted until contact was made with os over the superior postero-lateral aspect of the pedicular shadow (target area). After negative aspiration for blood, 0.5 mL of the nerve block solution was injected without difficulty or complication. The needle was removed intact. L3 Medial Branch Nerve Block (MBB): The target area for the L3 medial branch is at the junction of the postero-lateral aspect of the superior articular process and the superior, posterior, and medial edge of the transverse process of L4. Under fluoroscopic guidance, a Quincke needle was inserted until contact was made with os over the superior postero-lateral aspect of the pedicular shadow (target area). After negative aspiration for blood, 0.5 mL of the nerve block solution was injected without difficulty or complication. The needle was removed intact. L4 Medial Branch Nerve Block (MBB): The target area for the L4 medial branch is at the junction of the postero-lateral aspect of the superior articular process and the superior, posterior, and medial edge of the transverse process of L5. Under fluoroscopic guidance, a Quincke needle was inserted until contact was made with os over the superior postero-lateral aspect of the pedicular shadow (target area). After negative aspiration for blood, 0.5 mL of the nerve block solution was injected without difficulty or complication. The needle was removed intact. L5 Medial Branch Nerve Block (MBB): The target area for the L5 medial branch is at the junction of the postero-lateral aspect of the superior articular process and the superior,  posterior, and medial edge of the sacral ala. Under fluoroscopic guidance, a Quincke needle was inserted until contact was made with os over the superior postero-lateral aspect of  the pedicular shadow (target area). After negative aspiration for blood, 0.5 mL of the nerve block solution was injected without difficulty or complication. The needle was removed intact. S1 Medial Branch Nerve Block (MBB): The target area for the S1 medial branch is at the posterior and inferior 6 o'clock position of the L5-S1 facet joint. Under fluoroscopic guidance, the Quincke needle inserted for the L5 MBB was redirected until contact was made with os over the inferior and postero aspect of the sacrum, at the 6 o' clock position under the L5-S1 facet joint (Target area). After negative aspiration for blood, 0.5 mL of the nerve block solution was injected without difficulty or complication. The needle was removed intact.  Once the entire procedure was completed, the treated area was cleaned, making sure to leave some of the prepping solution back to take advantage of its long term bactericidal properties.         Illustration of the posterior view of the lumbar spine and the posterior neural structures. Laminae of L2 through S1 are labeled. DPRL5, dorsal primary ramus of L5; DPRS1, dorsal primary ramus of S1; DPR3, dorsal primary ramus of L3; FJ, facet (zygapophyseal) joint L3-L4; I, inferior articular process of L4; LB1, lateral branch of dorsal primary ramus of L1; IAB, inferior articular branches from L3 medial branch (supplies L4-L5 facet joint); IBP, intermediate branch plexus; MB3, medial branch of dorsal primary ramus of L3; NR3, third lumbar nerve root; S, superior articular process of L5; SAB, superior articular branches from L4 (supplies L4-5 facet joint also); TP3, transverse process of L3.  Vitals:   07/24/22 1107 07/24/22 1112 07/24/22 1117 07/24/22 1122  BP: (!) 124/96 (!) 137/108 (!) 129/91 (!) 141/99   Pulse:    72  Resp: '16 18 18   '$ Temp:    (!) 97.4 F (36.3 C)  TempSrc:    Temporal  SpO2: 97% 96% 96% 100%  Weight:      Height:         Start Time: 1106 hrs. End Time: 1116 hrs.  Imaging Guidance (Spinal):          Type of Imaging Technique: Fluoroscopy Guidance (Spinal) Indication(s): Assistance in needle guidance and placement for procedures requiring needle placement in or near specific anatomical locations not easily accessible without such assistance. Exposure Time: Please see nurses notes. Contrast: None used. Fluoroscopic Guidance: I was personally present during the use of fluoroscopy. "Tunnel Vision Technique" used to obtain the best possible view of the target area. Parallax error corrected before commencing the procedure. "Direction-depth-direction" technique used to introduce the needle under continuous pulsed fluoroscopy. Once target was reached, antero-posterior, oblique, and lateral fluoroscopic projection used confirm needle placement in all planes. Images permanently stored in EMR. Interpretation: No contrast injected. I personally interpreted the imaging intraoperatively. Adequate needle placement confirmed in multiple planes. Permanent images saved into the patient's record.  Antibiotic Prophylaxis:   Anti-infectives (From admission, onward)    None      Indication(s): None identified  Post-operative Assessment:  Post-procedure Vital Signs:  Pulse/HCG Rate: 7271 Temp: (!) 97.4 F (36.3 C) Resp: 18 BP: (!) 141/99 SpO2: 100 %  EBL: None  Complications: No immediate post-treatment complications observed by team, or reported by patient.  Note: The patient tolerated the entire procedure well. A repeat set of vitals were taken after the procedure and the patient was kept under observation following institutional policy, for this type of procedure. Post-procedural neurological assessment was performed, showing return to baseline, prior to  discharge. The  patient was provided with post-procedure discharge instructions, including a section on how to identify potential problems. Should any problems arise concerning this procedure, the patient was given instructions to immediately contact us, at any time, without hesitation. In any case, we plan to contact the patient by telephone for a follow-up status report regarding this interventional procedure.  Comments:  No additional relevant information.  Plan of Care  Orders:  Orders Placed This Encounter  Procedures   LUMBAR FACET(MEDIAL BRANCH NERVE BLOCK) MBNB    Scheduling Instructions:     Procedure: Lumbar facet block (AKA.: Lumbosacral medial branch nerve block)     Side: Bilateral     Level: L3-4 & L5-S1 Facets (L2, L3, L4, L5, & S1 Medial Branch Nerves)     Sedation: Patient's choice.     Timeframe: Today    Order Specific Question:   Where will this procedure be performed?    Answer:   [facility]   DG PA[facility] C-ARM 1-60 MIN NO REPORT    Intraoperative interpretation by procedural physician at [place].    Standing Status:   Standing    Number of Occurrences:   1    Order Specific Question:   Reason for exam:    Answer:   Assistance in needle guidance and placement for procedures requiring needle placement in or near specific anatomical locations not easily accessible without such assistance.   Informed Consent Details: Physician/Practitioner Attestation; Transcribe to consent form and obtain patient signature    Nursing Order: Transcribe to consent form and obtain patient signature. Note: Always confirm laterality of pain with Ms. Bucker, before procedure.    Order Specific Question:   Physician/Practitioner attestation of informed consent for procedure/surgical case    Answer:   I, the physician/practitioner, attest that I have discussed with the patient the benefits, risks, side effects, alternatives, likelihood of achieving goals and potential problems during  recovery for the procedure that I have provided informed consent.    Order Specific Question:   Procedure    Answer:   Lumbar Facet Block  under fluoroscopic guidance    Order Specific Question:   Physician/Practitioner performing the procedure    Answer:   Ashantae Pangallo A. Dossie Arbour MD    Order Specific Question:   Indication/Reason    Answer:   Low Back Pain, with our without leg pain, due to Facet Joint Arthralgia (Joint Pain) Spondylosis (Arthritis of the Spine), without myelopathy or radiculopathy (Nerve Damage).   Care order/instruction: Please confirm that the patient has stopped the Xarelto (Rivaroxaban) x 3 days prior to procedure or surgery.    Please confirm that the patient has stopped the Xarelto (Rivaroxaban) x 3 days prior to procedure or surgery.    Standing Status:   Standing    Number of Occurrences:   1   Provide equipment / supplies at bedside    Procedure tray: "Block Tray" (Disposable  single use) Skin infiltration needle: Regular 1.5-in, 25-G, (x1) Block Needle type: Spinal Amount/quantity: 4 Size: Regular (3.5-inch) Gauge: 22G    Standing Status:   Standing    Number of Occurrences:   1    Order Specific Question:   Specify    Answer:   Block Tray   Follow-up    Schedule on my procedure day (Tuesday/Thursday) as a Virtual Visit.    Standing Status:   Standing    Number of Occurrences:   1    Order Specific Question:  Specify    Answer:   (VV) (PPE)   Bleeding precautions    Standing Status:   Standing    Number of Occurrences:   1   Chronic Opioid Analgesic:  No opioid analgesics prescribed by our practice.  The patient received a letter from [facility] indicating that they would not be paying for her oxycodone prescriptions.   Medications ordered for procedure: Meds ordered this encounter  Medications   lidocaine (XYLOCAINE) 2 % (with pres) injection 400 mg   pentafluoroprop-tetrafluoroeth (GEBAUERS) aerosol   lactated ringers infusion   ropivacaine (PF) 2  mg/mL (0.2%) (NAROPIN) injection 18 mL   triamcinolone acetonide (KENALOG-40) injection 80 mg   midazolam (VERSED) injection 0.5-2 mg    Make sure Flumazenil is available in the pyxis when using this medication. If oversedation occurs, administer 0.2 mg IV over 15 sec. If after 45 sec no response, administer 0.2 mg again over 1 min; may repeat at 1 min intervals; not to exceed 4 doses (1 mg)   Medications administered: We administered lidocaine, pentafluoroprop-tetrafluoroeth, lactated ringers, ropivacaine (PF) 2 mg/mL (0.2%), triamcinolone acetonide, and midazolam.  See the medical record for exact dosing, route, and time of administration.  Follow-up plan:   Return in about 2 weeks (around 08/07/2022) for Proc-day (T,Th), (VV), (PPE).       Interventional Therapies  Risk  Complexity Considerations:   Estimated body mass index is 31.83 kg/m as calculated from the following:   Height as of 05/28/21: '5\' 2"'$  (1.575 m).   Weight as of 05/28/21: 174 lb (78.9 kg).   Anticoagulation: Xarelto. (Stop: 3 days  Restart: 6 hrs)  Allergy: Iodine and radiological contrast  Implant: Internal cardiac defibrillator  History of noncompliance with post procedure follow-ups. (Prior patient of Dr. Mohammed Kindle) NOT a candidate for Lumbar RFA 2ry to Fusion Hardware & implanted Cardiac Defibrillator.   Planned  Pending:   Palliative bilateral lumbar facet MBB #R10L9 (07/24/2022)    Under consideration:   She definitely does not want to proceed with the pump. (09/02/2021)    Completed:   Therapeutic left lumbar facet MBB x8 (12/19/2021) (100/100/100/100)  Therapeutic right lumbar facet MBB x9 (12/19/2021) (100/100/100/100)  Therapeutic bilateral L5 TFESI x2 (05/31/2019) (80/80/80/>75)  Therapeutic right lumbar facet RFA x1 (02/07/2020) (DO NOT REPEAT - FUSION hardware)   Completed by other providers:   Diagnostic right lumbar facet MBB x1 (03/12/2015) (n/a) by Dr. Mohammed Kindle   Diagnostic/therapeutic right SI joint NB x3 (02/05/2015) (n/a) by Dr. Mohammed Kindle  Lumbar spine surgery: ALIF L4-5, PSF L4-S1 (Dr. Elayne Guerin) (09/29/2019)    Therapeutic  Palliative (PRN) options:   Palliative lumbar facet MBB (for low back pain)  Palliative L5 TFESI (for radicular lower extremity pain)    Pharmacotherapy:  No chronic opioid analgesics therapy prescribed by our practice. Recommendations:   None at this time.     Recent Visits Date Type Provider Dept  07/14/22 Office Visit Milinda Pointer, MD [facility]  04/29/22 Office Visit Milinda Pointer, MD [facility]  Showing recent visits within past 90 days and meeting all other requirements Today's Visits Date Type Provider Dept  07/24/22 Procedure visit Milinda Pointer, MD [facility]  Showing today's visits and meeting all other requirements Future Appointments Date Type Provider Dept  08/07/22 Appointment Milinda Pointer, MD [facility]  Showing future appointments within next 90 days and meeting all other requirements  Disposition: Discharge home  Discharge (Date  Time): 07/24/2022; 1127 hrs.  Primary Care Physician: [name], MD Location: [facility] Note by: [name], MD Date: 07/24/2022; Time: 12:55 PM  Disclaimer:  Medicine is not an Chief Strategy Officer. The only guarantee in medicine is that nothing is guaranteed. It is important to note that the decision to proceed with this intervention was based on the information collected from the patient. The Data and conclusions were drawn from the patient's questionnaire, the interview, and the physical examination. Because the information was provided in large part by the patient, it cannot be guaranteed that it has not been purposely or unconsciously manipulated. Every effort has been made to obtain as much relevant data as possible for this evaluation. It is  important to note that the conclusions that lead to this procedure are derived in large part from the available data. Always take into account that the treatment will also be dependent on availability of resources and existing treatment guidelines, considered by other Pain Management Practitioners as being common knowledge and practice, at the time of the intervention. For Medico-Legal purposes, it is also important to point out that variation in procedural techniques and pharmacological choices are the acceptable norm. The indications, contraindications, technique, and results of the above procedure should only be interpreted and judged by a Board-Certified Interventional Pain Specialist with extensive familiarity and expertise in the same exact procedure and technique.

## 2022-07-24 ENCOUNTER — Ambulatory Visit
Admission: RE | Admit: 2022-07-24 | Discharge: 2022-07-24 | Disposition: A | Discharge status: Home / Self Care | Payer: Medicaid Other | Source: Ambulatory Visit | Attending: Pain Medicine | Admitting: Pain Medicine

## 2022-07-24 ENCOUNTER — Ambulatory Visit: Payer: Medicaid Other | Attending: Pain Medicine | Admitting: Pain Medicine

## 2022-07-24 ENCOUNTER — Encounter: Payer: Self-pay | Admitting: Pain Medicine

## 2022-07-24 VITALS — BP 141/99 | HR 72 | Temp 97.4°F | Resp 18 | Ht 61.0 in | Wt 150.0 lb

## 2022-07-24 DIAGNOSIS — M47816 Spondylosis without myelopathy or radiculopathy, lumbar region: Secondary | ICD-10-CM | POA: Diagnosis not present

## 2022-07-24 DIAGNOSIS — R937 Abnormal findings on diagnostic imaging of other parts of musculoskeletal system: Secondary | ICD-10-CM

## 2022-07-24 DIAGNOSIS — M5137 Other intervertebral disc degeneration, lumbosacral region: Secondary | ICD-10-CM | POA: Diagnosis present

## 2022-07-24 DIAGNOSIS — G8929 Other chronic pain: Secondary | ICD-10-CM | POA: Insufficient documentation

## 2022-07-24 DIAGNOSIS — M51379 Other intervertebral disc degeneration, lumbosacral region without mention of lumbar back pain or lower extremity pain: Secondary | ICD-10-CM

## 2022-07-24 DIAGNOSIS — M961 Postlaminectomy syndrome, not elsewhere classified: Secondary | ICD-10-CM | POA: Diagnosis present

## 2022-07-24 DIAGNOSIS — M4306 Spondylolysis, lumbar region: Secondary | ICD-10-CM | POA: Diagnosis present

## 2022-07-24 DIAGNOSIS — M4310 Spondylolisthesis, site unspecified: Secondary | ICD-10-CM | POA: Diagnosis present

## 2022-07-24 DIAGNOSIS — M545 Low back pain, unspecified: Secondary | ICD-10-CM

## 2022-07-24 DIAGNOSIS — Z7901 Long term (current) use of anticoagulants: Secondary | ICD-10-CM | POA: Diagnosis present

## 2022-07-24 DIAGNOSIS — M47817 Spondylosis without myelopathy or radiculopathy, lumbosacral region: Secondary | ICD-10-CM

## 2022-07-24 MED ORDER — LACTATED RINGERS IV SOLN: Freq: Once | INTRAVENOUS | Status: AC

## 2022-07-24 MED ORDER — MIDAZOLAM HCL 2 MG/2ML IJ SOLN
INTRAMUSCULAR | Status: AC
  Filled 2022-07-24: qty 2

## 2022-07-24 MED ORDER — LIDOCAINE HCL 2 % IJ SOLN
INTRAMUSCULAR | Status: AC
  Filled 2022-07-24: qty 20

## 2022-07-24 MED ORDER — PENTAFLUOROPROP-TETRAFLUOROETH EX AERO
INHALATION_SPRAY | Freq: Once | CUTANEOUS | Status: AC
  Administered 2022-07-24: 30 via TOPICAL
  Filled 2022-07-24: qty 116

## 2022-07-24 MED ORDER — ROPIVACAINE HCL 2 MG/ML IJ SOLN
INTRAMUSCULAR | Status: AC
  Filled 2022-07-24: qty 20

## 2022-07-24 MED ORDER — LIDOCAINE HCL 2 % IJ SOLN
20.0000 mL | Freq: Once | INTRAMUSCULAR | Status: AC
  Administered 2022-07-24: 400 mg

## 2022-07-24 MED ORDER — TRIAMCINOLONE ACETONIDE 40 MG/ML IJ SUSP
INTRAMUSCULAR | Status: AC
  Filled 2022-07-24: qty 2

## 2022-07-24 MED ORDER — MIDAZOLAM HCL 2 MG/2ML IJ SOLN
0.5000 mg | Freq: Once | INTRAMUSCULAR | Status: AC
  Administered 2022-07-24: 2 mg via INTRAVENOUS

## 2022-07-24 MED ORDER — ROPIVACAINE HCL 2 MG/ML IJ SOLN
18.0000 mL | Freq: Once | INTRAMUSCULAR | Status: AC
  Administered 2022-07-24: 18 mL via PERINEURAL

## 2022-07-24 MED ORDER — TRIAMCINOLONE ACETONIDE 40 MG/ML IJ SUSP
80.0000 mg | Freq: Once | INTRAMUSCULAR | Status: AC
  Administered 2022-07-24: 80 mg

## 2022-07-24 NOTE — Progress Notes (Signed)
Safety precautions to be maintained throughout the outpatient stay will include: orient to surroundings, keep bed in low position, maintain call bell within reach at all times, provide assistance with transfer out of bed and ambulation.  

## 2022-07-24 NOTE — Patient Instructions (Addendum)
Begin Louanna Raw tomorrow ____________________________________________________________________________________________  Post-Procedure Discharge Instructions  Instructions: Apply ice:  Purpose: This will minimize any swelling and discomfort after procedure.  When: Day of procedure, as soon as you get home. How: Fill a plastic sandwich bag with crushed ice. Cover it with a small towel and apply to injection site. How long: (15 min on, 15 min off) Apply for 15 minutes then remove x 15 minutes.  Repeat sequence on day of procedure, until you go to bed. Apply heat:  Purpose: To treat any soreness and discomfort from the procedure. When: Starting the next day after the procedure. How: Apply heat to procedure site starting the day following the procedure. How long: May continue to repeat daily, until discomfort goes away. Food intake: Start with clear liquids (like water) and advance to regular food, as tolerated.  Physical activities: Keep activities to a minimum for the first 8 hours after the procedure. After that, then as tolerated. Driving: If you have received any sedation, be responsible and do not drive. You are not allowed to drive for 24 hours after having sedation. Blood thinner: (Applies only to those taking blood thinners) You may restart your blood thinner 6 hours after your procedure. Insulin: (Applies only to Diabetic patients taking insulin) As soon as you can eat, you may resume your normal dosing schedule. Infection prevention: Keep procedure site clean and dry. Shower daily and clean area with soap and water. Post-procedure Pain Diary: Extremely important that this be done correctly and accurately. Recorded information will be used to determine the next step in treatment. For the purpose of accuracy, follow these rules: Evaluate only the area treated. Do not report or include pain from an untreated area. For the purpose of this evaluation, ignore all other areas of pain, except for  the treated area. After your procedure, avoid taking a long nap and attempting to complete the pain diary after you wake up. Instead, set your alarm clock to go off every hour, on the hour, for the initial 8 hours after the procedure. Document the duration of the numbing medicine, and the relief you are getting from it. Do not go to sleep and attempt to complete it later. It will not be accurate. If you received sedation, it is likely that you were given a medication that may cause amnesia. Because of this, completing the diary at a later time may cause the information to be inaccurate. This information is needed to plan your care. Follow-up appointment: Keep your post-procedure follow-up evaluation appointment after the procedure (usually 2 weeks for most procedures, 6 weeks for radiofrequencies). DO NOT FORGET to bring you pain diary with you.   Expect: (What should I expect to see with my procedure?) From numbing medicine (AKA: Local Anesthetics): Numbness or decrease in pain. You may also experience some weakness, which if present, could last for the duration of the local anesthetic. Onset: Full effect within 15 minutes of injected. Duration: It will depend on the type of local anesthetic used. On the average, 1 to 8 hours.  From steroids (Applies only if steroids were used): Decrease in swelling or inflammation. Once inflammation is improved, relief of the pain will follow. Onset of benefits: Depends on the amount of swelling present. The more swelling, the longer it will take for the benefits to be seen. In some cases, up to 10 days. Duration: Steroids will stay in the system x 2 weeks. Duration of benefits will depend on multiple posibilities including persistent irritating factors. Side-effects:  If present, they may typically last 2 weeks (the duration of the steroids). Frequent: Cramps (if they occur, drink Gatorade and take over-the-counter Magnesium 450-500 mg once to twice a day); water  retention with temporary weight gain; increases in blood sugar; decreased immune system response; increased appetite. Occasional: Facial flushing (red, warm cheeks); mood swings; menstrual changes. Uncommon: Long-term decrease or suppression of natural hormones; bone thinning. (These are more common with higher doses or more frequent use. This is why we prefer that our patients avoid having any injection therapies in other practices.)  Very Rare: Severe mood changes; psychosis; aseptic necrosis. From procedure: Some discomfort is to be expected once the numbing medicine wears off. This should be minimal if ice and heat are applied as instructed.  Call if: (When should I call?) You experience numbness and weakness that gets worse with time, as opposed to wearing off. New onset bowel or bladder incontinence. (Applies only to procedures done in the spine)  Emergency Numbers: Durning business hours (Monday - Thursday, 8:00 AM - 4:00 PM) (Friday, 9:00 AM - 12:00 Noon): [phone] After hours: [phone] NOTE: If you are having a problem and are unable connect with, or to talk to a provider, then go to your nearest urgent care or emergency department. If the problem is serious and urgent, please call 911. ____________________________________________________________________________________________

## 2022-07-25 ENCOUNTER — Telehealth: Payer: Self-pay

## 2022-07-25 NOTE — Telephone Encounter (Signed)
Post procedure follow up.  Patient states she is doing well.   ?

## 2022-08-07 ENCOUNTER — Ambulatory Visit: Payer: Medicaid Other | Attending: Pain Medicine | Admitting: Pain Medicine

## 2022-08-07 DIAGNOSIS — M79604 Pain in right leg: Secondary | ICD-10-CM | POA: Diagnosis not present

## 2022-08-07 DIAGNOSIS — M545 Low back pain, unspecified: Secondary | ICD-10-CM | POA: Diagnosis not present

## 2022-08-07 DIAGNOSIS — M79605 Pain in left leg: Secondary | ICD-10-CM

## 2022-08-07 DIAGNOSIS — G8929 Other chronic pain: Secondary | ICD-10-CM

## 2022-08-07 DIAGNOSIS — M961 Postlaminectomy syndrome, not elsewhere classified: Secondary | ICD-10-CM

## 2022-08-07 DIAGNOSIS — G894 Chronic pain syndrome: Secondary | ICD-10-CM

## 2022-08-07 DIAGNOSIS — M47816 Spondylosis without myelopathy or radiculopathy, lumbar region: Secondary | ICD-10-CM | POA: Diagnosis not present

## 2022-08-07 NOTE — Patient Instructions (Signed)
  ____________________________________________________________________________________________  Patient Information update  To: All of our patients.  Re: Name change.  It has been made official that our current name, "[facility]"   will soon be changed to "[facility]".   The purpose of this change is to eliminate any confusion created by the concept of our practice being a "Medication Management Pain Clinic". In the past this has led to the misconception that we treat pain primarily by the use of prescription medications.  Nothing can be farther from the truth.   Understanding PAIN MANAGEMENT: To further understand what our practice does, you first have to understand that "Pain Management" is a subspecialty that requires additional training once a physician has completed their specialty training, which can be in either Anesthesia, Neurology, Psychiatry, or Physical Medicine and Rehabilitation (PMR). Each one of these contributes to the final approach taken by each physician to the management of their patient's pain. To be a "Pain Management Specialist" you must have first completed one of the specialty trainings below.  Anesthesiologists - trained in clinical pharmacology and interventional techniques such as nerve blockade and regional as well as central neuroanatomy. They are trained to block pain before, during, and after surgical interventions.  Neurologists - trained in the diagnosis and pharmacological treatment of complex neurological conditions, such as Multiple Sclerosis, Parkinson's, spinal cord injuries, and other systemic conditions that may be associated with symptoms that may include but are not limited to pain. They tend to rely primarily on the treatment of chronic pain using prescription medications.  Psychiatrist - trained in conditions affecting the psychosocial  wellbeing of patients including but not limited to depression, anxiety, schizophrenia, personality disorders, addiction, and other substance use disorders that may be associated with chronic pain. They tend to rely primarily on the treatment of chronic pain using prescription medications.   Physical Medicine and Rehabilitation (PMR) physicians, also known as physiatrists - trained to treat a wide variety of medical conditions affecting the brain, spinal cord, nerves, bones, joints, ligaments, muscles, and tendons. Their training is primarily aimed at treating patients that have suffered injuries that have caused severe physical impairment. Their training is primarily aimed at the physical therapy and rehabilitation of those patients. They may also work alongside orthopedic surgeons or neurosurgeons using their expertise in assisting surgical patients to recover after their surgeries.  INTERVENTIONAL PAIN MANAGEMENT is sub-subspecialty of Pain Management.  Our physicians are Board-certified in Anesthesia, Pain Management, and Interventional Pain Management.  This meaning that not only have they been trained and Board-certified in their specialty of Anesthesia, and subspecialty of Pain Management, but they have also received further training in the sub-subspecialty of Interventional Pain Management, in order to become Board-certified as INTERVENTIONAL PAIN MANAGEMENT SPECIALIST.    Mission: Our goal is to use our skills in  INTERVENTIONAL PAIN MANAGEMENT as alternatives to the chronic use of prescription opioid medications for the treatment of pain. To make this more clear, we have changed our name to reflect what we do and offer. We will continue to offer medication management assessment and recommendations, but we will not be taking over any patient's medication management.  ____________________________________________________________________________________________     

## 2022-08-07 NOTE — Progress Notes (Signed)
Patient: Claire Mcdonald  Service Category: E/M  Provider: [name], MD  DOB: [birth year]  DOS: 08/07/2022  Location: Office  MRN: [id number]  Setting: Ambulatory outpatient  Referring Provider: [name], MD  Type: Established Patient  Specialty: Interventional Pain Management  PCP: [name], MD  Location: Remote location  Delivery: TeleHealth     Virtual Encounter - Pain Management PROVIDER NOTE: Information contained herein reflects review and annotations entered in association with encounter. Interpretation of such information and data should be left to medically-trained personnel. Information provided to patient can be located elsewhere in the medical record under "Patient Instructions". Document created using STT-dictation technology, any transcriptional errors that may result from process are unintentional.    Contact & Pharmacy Preferred: [phone] Home: [phone] (home) Mobile: [phone] (mobile) E-mail: [name]  [facility] [place] - [place] [address] Phone: [phone] Fax: [phone]  [facility] - [place], [place] - [address] [place] [address] Phone: [phone] Fax: [phone]   Pre-screening  Claire Mcdonald offered "in-person" vs "virtual" encounter. She indicated preferring virtual for this encounter.   Reason COVID-19*  Social distancing based on CDC and AMA recommendations.   I contacted Claire Mcdonald on 08/07/2022 via telephone.      I clearly identified myself as [name], MD. I verified that I was speaking with the correct person using two identifiers (Name: Claire Mcdonald, and date of birth: 1965-10-18).  Consent I sought verbal advanced consent from Claire Mcdonald for virtual visit interactions. I informed Claire Mcdonald of possible security and privacy concerns, risks, and limitations associated with providing "not-in-person" medical  evaluation and management services. I also informed Claire Mcdonald of the availability of "in-person" appointments. Finally, I informed her that there would be a charge for the virtual visit and that she could be  personally, fully or partially, financially responsible for it. Claire Mcdonald expressed understanding and agreed to proceed.   Historic Elements   Claire Mcdonald is a 56 y.o. year old, female patient evaluated today after our last contact on 07/24/2022. 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 She also  has a past surgical history that includes Tubal ligation (1998); Ovarian cyst removal (Left); central line (03/25/2015); Hand surgery (Left); Cardiac catheterization (N/A, 03/27/2015); Cardiac catheterization (N/A, 04/11/2015); Cardiac catheterization (N/A, 07/16/2015); Colonoscopy with propofol (N/A, 10/01/2015); Esophagogastroduodenoscopy (egd) with propofol (N/A, 10/01/2015); Abdominal hysterectomy; and Hip Arthroplasty (Right, 02/27/2020). Claire Mcdonald has a current medication list which includes the following prescription(s): acetaminophen, albuterol, atorvastatin, vitamin d3, diclofenac sodium, furosemide, gabapentin, isosorbide mononitrate, ketoconazole, lisinopril, magnesium, metoprolol tartrate, polyethylene glycol, potassium chloride sa, rivaroxaban, senna-docusate, sertraline, topiramate, trazodone, triamcinolone, azelastine, gnp calcium 1200, and famotidine. She  reports that she has never smoked. She has never used smokeless tobacco. She reports that she does not drink  alcohol and does not use drugs. Claire Mcdonald is allergic to tape, iodinated contrast media, toradol [ketorolac tromethamine], tramadol, and zofran [ondansetron hcl].  Estimated body mass index is 28.34 kg/m as calculated from the following:   Height as of 07/24/22: _1  (1.549 m).  Weight as of 07/24/22: 150 lb (68 kg).  HPI  Today, she is being contacted for a post-procedure assessment.  Post-procedure evaluation   Type: Lumbar Facet, Medial Branch Block(s) #R10L9   Laterality: Bilateral  Level: L2, L3, L4, L5, & S1 Medial Branch Level(s). Injecting these levels blocks the L3-4, L4-5 and L5-S1 lumbar facet joints. Imaging: Fluoroscopic guidance         Anesthesia: Local anesthesia (1-2% Lidocaine) Anxiolysis: IV Versed 2.0 mg Sedation: No Sedation                       DOS: 07/24/2022 Performed by: [name], MD  Primary Purpose: Diagnostic/Therapeutic Indications: Low back pain severe enough to impact quality of life or function. 1. Lumbar facet syndrome (Bilateral) (R>L)   2. Spondylosis without myelopathy or radiculopathy, lumbosacral region   3. Lumbar facet arthropathy   4. Pars defect of lumbar spine   5. Grade 2-3 (13-16 mm) Anterolisthesis of L5/S1 (06/22/2019)   6. DDD (degenerative disc disease), lumbosacral   7. Failed back surgical syndrome   8. Chronic low back pain (Bilateral) w/o sciatica   9. Abnormal CT scan, lumbar spine (02/25/2020)   10. Chronic anticoagulation (Xarelto)    NAS-11 Pain score:   Pre-procedure: 8 /10   Post-procedure: 0-No pain/10      Effectiveness:  Initial hour after procedure: 100 %. Subsequent 4-6 hours post-procedure: 100 %. Analgesia past initial 6 hours: 70 % (ongoing). Ongoing improvement:  Analgesic: The patient refers currently having 100% relief of the pain that seems to be ongoing. Function: Claire Mcdonald reports improvement in function ROM: Claire Mcdonald reports improvement in ROM  Pharmacotherapy Assessment   Opioid  Analgesic: No opioid analgesics prescribed by our practice.  The patient received a letter from [facility] indicating that they would not be paying for her oxycodone prescriptions.   Monitoring: [place] PMP: PDMP reviewed during this encounter.       Pharmacotherapy: No side-effects or adverse reactions reported. Compliance: No problems identified. Effectiveness: Clinically acceptable. Plan: Refer to "POC". UDS: No results found for: "SUMMARY" No results found for: "CBDTHCR", "D8THCCBX", "D9THCCBX"   Laboratory Chemistry Profile   Renal Lab Results  Component Value Date   BUN 15 12/21/2021   CREATININE 0.59 12/21/2021   BCR 21 04/16/2015   GFRAA >60 03/01/2020   GFRNONAA >60 12/21/2021    Hepatic Lab Results  Component Value Date   AST 20 12/21/2021   ALT 7 12/21/2021   ALBUMIN 3.8 12/21/2021   ALKPHOS 54 12/21/2021   LIPASE 26 12/21/2021    Electrolytes Lab Results  Component Value Date   NA 139 12/21/2021   K 4.1 12/21/2021   CL 106 12/21/2021   CALCIUM 9.2 12/21/2021   MG 2.3 12/21/2021   PHOS 3.8 02/19/2017    Bone Lab Results  Component Value Date   VD25OH 19.3 (L) 11/30/2017    Inflammation (CRP: Acute Phase) (ESR: Chronic Phase) Lab Results  Component Value Date   CRP <0.8 11/30/2017   ESRSEDRATE 19 11/30/2017   LATICACIDVEN 0.8 03/26/2015         Note: Above Lab results reviewed.  Imaging  DG PAIN CLINIC C-ARM 1-60 MIN NO REPORT Fluoro was used, but no Radiologist interpretation will be provided.  Please refer to "NOTES" tab for provider progress note.  Assessment  The primary encounter diagnosis was Lumbar facet syndrome (Bilateral) (R>L). Diagnoses of Chronic low back pain (Bilateral) w/o sciatica, Chronic lower extremity pain (2ry  area of Pain) (Bilateral) (R>L), Failed back surgical syndrome, and Chronic pain syndrome were also pertinent to this visit.  Plan of Care  Problem-specific:  No problem-specific Assessment & Plan notes found for this  encounter.  Claire Mcdonald has a current medication list which includes the following long-term medication(s): albuterol, atorvastatin, furosemide, isosorbide mononitrate, metoprolol tartrate, rivaroxaban, topiramate, azelastine, gnp calcium 1200, and famotidine.  Pharmacotherapy (Medications Ordered): No orders of the defined types were placed in this encounter.  Orders:  No orders of the defined types were placed in this encounter.  Follow-up plan:   Return if symptoms worsen or fail to improve.     Interventional Therapies  Risk  Complexity Considerations:   Estimated body mass index is 31.83 kg/m as calculated from the following:   Height as of 05/28/21: _0  (1.575 m).   Weight as of 05/28/21: 174 lb (78.9 kg).   Anticoagulation: Xarelto. (Stop: 3 days  Restart: 6 hrs)  Allergy: Iodine and radiological contrast  Implant: Internal cardiac defibrillator  History of noncompliance with post procedure follow-ups. (Prior patient of Dr. Mohammed Kindle) NOT a candidate for Lumbar RFA 2ry to Fusion Hardware & implanted Cardiac Defibrillator.   Planned  Pending:      Under consideration:   She definitely does not want to proceed with the pump. (09/02/2021)    Completed:   Therapeutic left lumbar facet MBB x9 (07/24/2022) (100/100/70/100)  Therapeutic right lumbar facet MBB x10 (07/24/2022) (100/100/70/100)  Therapeutic bilateral L5 TFESI x2 (05/31/2019) (80/80/80/>75)  Therapeutic right lumbar facet RFA x1 (02/07/2020) (DO NOT REPEAT - FUSION hardware)   Completed by other providers:   Diagnostic right lumbar facet MBB x1 (03/12/2015) (n/a) by Dr. Mohammed Kindle  Diagnostic/therapeutic right SI joint NB x3 (02/05/2015) (n/a) by Dr. Mohammed Kindle  Lumbar spine surgery: ALIF L4-5, PSF L4-S1 (Dr. Elayne Guerin) (09/29/2019)    Therapeutic  Palliative (PRN) options:   Palliative lumbar facet MBB (for low back pain)  Palliative L5 TFESI (for radicular lower extremity pain)     Pharmacotherapy:  No chronic opioid analgesics therapy prescribed by our practice. Recommendations:   None at this time.     Recent Visits Date Type Provider Dept  07/24/22 Procedure visit Milinda Pointer, MD [facility]  07/14/22 Office Visit Milinda Pointer, MD [facility]  Showing recent visits within past 90 days and meeting all other requirements Today's Visits Date Type Provider Dept  08/07/22 Office Visit Milinda Pointer, MD [facility]  Showing today's visits and meeting all other requirements Future Appointments No visits were found meeting these conditions. Showing future appointments within next 90 days and meeting all other requirements  I discussed the assessment and treatment plan with the patient. The patient was provided an opportunity to ask questions and all were answered. The patient agreed with the plan and demonstrated an understanding of the instructions.  Patient advised to call back or seek an in-person evaluation if the symptoms or condition worsens.  Duration of encounter: 12 minutes.  Note by: [name], MD Date: 08/07/2022; Time: 11:48 AM

## 2022-08-20 ENCOUNTER — Encounter: Payer: Self-pay | Admitting: Sports Medicine

## 2022-08-20 DIAGNOSIS — Z1231 Encounter for screening mammogram for malignant neoplasm of breast: Secondary | ICD-10-CM

## 2022-08-21 DIAGNOSIS — N644 Mastodynia: Secondary | ICD-10-CM | POA: Insufficient documentation

## 2022-11-11 ENCOUNTER — Ambulatory Visit: Payer: Medicaid Other | Attending: Pain Medicine | Admitting: Pain Medicine

## 2022-11-11 ENCOUNTER — Encounter: Payer: Self-pay | Admitting: Pain Medicine

## 2022-11-11 VITALS — BP 127/99 | HR 54 | Temp 97.3°F | Ht 61.0 in | Wt 151.0 lb

## 2022-11-11 DIAGNOSIS — M5137 Other intervertebral disc degeneration, lumbosacral region: Secondary | ICD-10-CM | POA: Diagnosis not present

## 2022-11-11 DIAGNOSIS — G8929 Other chronic pain: Secondary | ICD-10-CM | POA: Insufficient documentation

## 2022-11-11 DIAGNOSIS — Z7901 Long term (current) use of anticoagulants: Secondary | ICD-10-CM | POA: Insufficient documentation

## 2022-11-11 DIAGNOSIS — M545 Low back pain, unspecified: Secondary | ICD-10-CM | POA: Diagnosis not present

## 2022-11-11 DIAGNOSIS — M4310 Spondylolisthesis, site unspecified: Secondary | ICD-10-CM | POA: Diagnosis present

## 2022-11-11 DIAGNOSIS — M47816 Spondylosis without myelopathy or radiculopathy, lumbar region: Secondary | ICD-10-CM | POA: Diagnosis not present

## 2022-11-11 DIAGNOSIS — M961 Postlaminectomy syndrome, not elsewhere classified: Secondary | ICD-10-CM | POA: Insufficient documentation

## 2022-11-11 NOTE — Progress Notes (Signed)
PROVIDER NOTE: Interpretation of information contained herein should be left to medically-trained personnel. Specific patient instructions are provided elsewhere under "Patient Instructions" section of medical record. This document was created in part using STT-dictation technology, any transcriptional errors that may result from this process are unintentional.  Patient: Claire Mcdonald Type: Established DOB: June 12, 1966 MRN: [id number] PCP: [name], MD  Service: Procedure DOS: 11/13/2022 Setting: Ambulatory Location: Ambulatory outpatient facility Delivery: Face-to-face Provider: [name], MD Specialty: Interventional Pain Management Specialty designation: 09 Location: Outpatient facility Ref. Prov.: Milinda Pointer, MD       Interventional Therapy   Procedure: Lumbar Facet, Medial Branch Block(s) [id number]   Laterality: Bilateral  Level: T12, L1, L2, L3, L4, L5, and S1 Medial Branch Level(s). Injecting these levels blocks the L1-2, L2-3, L3-4, L4-5, and L5-S1 lumbar facet joints. Imaging: Fluoroscopic guidance         Anesthesia: Local anesthesia (1-2% Lidocaine) Anxiolysis: IV Versed         Sedation:                         DOS: 11/13/2022 Performed by: [name], MD  Primary Purpose: Diagnostic/Therapeutic Indications: Low back pain severe enough to impact quality of life or function. No diagnosis found. NAS-11 Pain score:   Pre-procedure:  /10   Post-procedure:  /10     Position / Prep / Materials:  Position: Prone  Prep solution: DuraPrep (Iodine Povacrylex [0.7% available iodine] and Isopropyl Alcohol, 74% w/w) Area Prepped: Posterolateral Lumbosacral Spine (Wide prep: From the lower border of the scapula down to the end of the tailbone and from flank to flank.)  Materials:  Tray: Block Needle(s):  Type: Spinal  Gauge (G): 22  Length: 5-in Qty:    ***      Pre-op H&P Assessment:  Claire Mcdonald is a 57 y.o. (year old), female patient, seen today  for interventional treatment. She  has a past surgical history that includes Tubal ligation (1998); Ovarian cyst removal (Left); central line (03/25/2015); Hand surgery (Left); Cardiac catheterization (N/A, 03/27/2015); Cardiac catheterization (N/A, 04/11/2015); Cardiac catheterization (N/A, 07/16/2015); Colonoscopy with propofol (N/A, 10/01/2015); Esophagogastroduodenoscopy (egd) with propofol (N/A, 10/01/2015); Abdominal hysterectomy; and Hip Arthroplasty (Right, 02/27/2020). Claire Mcdonald has a current medication list which includes the following prescription(s): acetaminophen, atorvastatin, azelastine, gnp calcium 1200, vitamin d3, diclofenac sodium, famotidine, furosemide, gabapentin, isosorbide mononitrate, ketoconazole, lisinopril, magnesium, metoprolol tartrate, polyethylene glycol, potassium chloride sa, rivaroxaban, senna-docusate, sertraline, topiramate, trazodone, and triamcinolone. Her primarily concern today is the No chief complaint on file.  Initial Vital Signs:  Pulse/HCG Rate:    Temp:   Resp:   BP:   SpO2:    BMI: Estimated body mass index is 28.53 kg/m as calculated from the following:   Height as of 11/11/22: 5\' 1"  (1.549 m).   Weight as of 11/11/22: 151 lb (68.5 kg).  Risk Assessment: Allergies: Reviewed. She is allergic to tape, iodinated contrast media, toradol [ketorolac tromethamine], tramadol, and zofran [ondansetron hcl].  Allergy Precautions: None required Coagulopathies: Reviewed. None identified.  Blood-thinner therapy: None at this time Active Infection(s): Reviewed. None identified. Claire Mcdonald is afebrile  Site Confirmation: Claire Mcdonald was asked to confirm the procedure and laterality before marking the site Procedure checklist: Completed Consent: Before the procedure and under the influence of no sedative(s), amnesic(s), or anxiolytics, the patient was informed of the treatment options, risks and possible complications. To fulfill our ethical and legal obligations, as recommended  by  the American Medical Association's Code of Ethics, I have informed the patient of my clinical impression; the nature and purpose of the treatment or procedure; the risks, benefits, and possible complications of the intervention; the alternatives, including doing nothing; the risk(s) and benefit(s) of the alternative treatment(s) or procedure(s); and the risk(s) and benefit(s) of doing nothing. The patient was provided information about the general risks and possible complications associated with the procedure. These may include, but are not limited to: failure to achieve desired goals, infection, bleeding, organ or nerve damage, allergic reactions, paralysis, and death. In addition, the patient was informed of those risks and complications associated to Spine-related procedures, such as failure to decrease pain; infection (i.e.: Meningitis, epidural or intraspinal abscess); bleeding (i.e.: epidural hematoma, subarachnoid hemorrhage, or any other type of intraspinal or peri-dural bleeding); organ or nerve damage (i.e.: Any type of peripheral nerve, nerve root, or spinal cord injury) with subsequent damage to sensory, motor, and/or autonomic systems, resulting in permanent pain, numbness, and/or weakness of one or several areas of the body; allergic reactions; (i.e.: anaphylactic reaction); and/or death. Furthermore, the patient was informed of those risks and complications associated with the medications. These include, but are not limited to: allergic reactions (i.e.: anaphylactic or anaphylactoid reaction(s)); adrenal axis suppression; blood sugar elevation that in diabetics may result in ketoacidosis or comma; water retention that in patients with history of congestive heart failure may result in shortness of breath, pulmonary edema, and decompensation with resultant heart failure; weight gain; swelling or edema; medication-induced neural toxicity; particulate matter embolism and blood vessel occlusion with  resultant organ, and/or nervous system infarction; and/or aseptic necrosis of one or more joints. Finally, the patient was informed that Medicine is not an exact science; therefore, there is also the possibility of unforeseen or unpredictable risks and/or possible complications that may result in a catastrophic outcome. The patient indicated having understood very clearly. We have given the patient no guarantees and we have made no promises. Enough time was given to the patient to ask questions, all of which were answered to the patient's satisfaction. Ms. Tesoriero has indicated that she wanted to continue with the procedure. Attestation: I, the ordering provider, attest that I have discussed with the patient the benefits, risks, side-effects, alternatives, likelihood of achieving goals, and potential problems during recovery for the procedure that I have provided informed consent. Date  Time: {CHL [facility]-PAIN TIME CHOICES:[id number]}   Pre-Procedure Preparation:  Monitoring: As per clinic protocol. Respiration, ETCO2, SpO2, BP, heart rate and rhythm monitor placed and checked for adequate function Safety Precautions: Patient was assessed for positional comfort and pressure points before starting the procedure. Time-out: I initiated and conducted the "Time-out" before starting the procedure, as per protocol. The patient was asked to participate by confirming the accuracy of the "Time Out" information. Verification of the correct person, site, and procedure were performed and confirmed by me, the nursing staff, and the patient. "Time-out" conducted as per Joint Commission's Universal Protocol (UP.01.01.01). Time:    Description of Procedure:          Laterality: (see above) Targeted Levels: (see above)  Safety Precautions: Aspiration looking for blood return was conducted prior to all injections. At no point did we inject any substances, as a needle was being advanced. Before injecting, the patient was told  to immediately notify me if she was experiencing any new onset of "ringing in the ears, or metallic taste in the mouth". No attempts were made at seeking any paresthesias. Safe  injection practices and needle disposal techniques used. Medications properly checked for expiration dates. SDV (single dose vial) medications used. After the completion of the procedure, all disposable equipment used was discarded in the proper designated medical waste containers. Local Anesthesia: Protocol guidelines were followed. The patient was positioned over the fluoroscopy table. The area was prepped in the usual manner. The time-out was completed. The target area was identified using fluoroscopy. A 12-in long, straight, sterile hemostat was used with fluoroscopic guidance to locate the targets for each level blocked. Once located, the skin was marked with an approved surgical skin marker. Once all sites were marked, the skin (epidermis, dermis, and hypodermis), as well as deeper tissues (fat, connective tissue and muscle) were infiltrated with a small amount of a short-acting local anesthetic, loaded on a 10cc syringe with a 25G, 1.5-in  Needle. An appropriate amount of time was allowed for local anesthetics to take effect before proceeding to the next step. Local Anesthetic: Lidocaine 2.0% The unused portion of the local anesthetic was discarded in the proper designated containers. Technical description of process:  L2 Medial Branch Nerve Block (MBB): The target area for the L2 medial branch is at the junction of the postero-lateral aspect of the superior articular process and the superior, posterior, and medial edge of the transverse process of L3. Under fluoroscopic guidance, a Quincke needle was inserted until contact was made with os over the superior postero-lateral aspect of the pedicular shadow (target area). After negative aspiration for blood, 0.5 mL of the nerve block solution was injected without difficulty or  complication. The needle was removed intact. L3 Medial Branch Nerve Block (MBB): The target area for the L3 medial branch is at the junction of the postero-lateral aspect of the superior articular process and the superior, posterior, and medial edge of the transverse process of L4. Under fluoroscopic guidance, a Quincke needle was inserted until contact was made with os over the superior postero-lateral aspect of the pedicular shadow (target area). After negative aspiration for blood, 0.5 mL of the nerve block solution was injected without difficulty or complication. The needle was removed intact. L4 Medial Branch Nerve Block (MBB): The target area for the L4 medial branch is at the junction of the postero-lateral aspect of the superior articular process and the superior, posterior, and medial edge of the transverse process of L5. Under fluoroscopic guidance, a Quincke needle was inserted until contact was made with os over the superior postero-lateral aspect of the pedicular shadow (target area). After negative aspiration for blood, 0.5 mL of the nerve block solution was injected without difficulty or complication. The needle was removed intact. L5 Medial Branch Nerve Block (MBB): The target area for the L5 medial branch is at the junction of the postero-lateral aspect of the superior articular process and the superior, posterior, and medial edge of the sacral ala. Under fluoroscopic guidance, a Quincke needle was inserted until contact was made with os over the superior postero-lateral aspect of the pedicular shadow (target area). After negative aspiration for blood, 0.5 mL of the nerve block solution was injected without difficulty or complication. The needle was removed intact. S1 Medial Branch Nerve Block (MBB): The target area for the S1 medial branch is at the posterior and inferior 6 o'clock position of the L5-S1 facet joint. Under fluoroscopic guidance, the Quincke needle inserted for the L5 MBB was  redirected until contact was made with os over the inferior and postero aspect of the sacrum, at the 6 o' clock  position under the L5-S1 facet joint (Target area). After negative aspiration for blood, 0.5 mL of the nerve block solution was injected without difficulty or complication. The needle was removed intact.  Once the entire procedure was completed, the treated area was cleaned, making sure to leave some of the prepping solution back to take advantage of its long term bactericidal properties.         Illustration of the posterior view of the lumbar spine and the posterior neural structures. Laminae of L2 through S1 are labeled. DPRL5, dorsal primary ramus of L5; DPRS1, dorsal primary ramus of S1; DPR3, dorsal primary ramus of L3; FJ, facet (zygapophyseal) joint L3-L4; I, inferior articular process of L4; LB1, lateral branch of dorsal primary ramus of L1; IAB, inferior articular branches from L3 medial branch (supplies L4-L5 facet joint); IBP, intermediate branch plexus; MB3, medial branch of dorsal primary ramus of L3; NR3, third lumbar nerve root; S, superior articular process of L5; SAB, superior articular branches from L4 (supplies L4-5 facet joint also); TP3, transverse process of L3.  There were no vitals filed for this visit.   Start Time:   hrs. End Time:   hrs.  Imaging Guidance (Spinal):          Type of Imaging Technique: Fluoroscopy Guidance (Spinal) Indication(s): Assistance in needle guidance and placement for procedures requiring needle placement in or near specific anatomical locations not easily accessible without such assistance. Exposure Time: Please see nurses notes. Contrast: None used. Fluoroscopic Guidance: I was personally present during the use of fluoroscopy. "Tunnel Vision Technique" used to obtain the best possible view of the target area. Parallax error corrected before commencing the procedure. "Direction-depth-direction" technique used to introduce the needle  under continuous pulsed fluoroscopy. Once target was reached, antero-posterior, oblique, and lateral fluoroscopic projection used confirm needle placement in all planes. Images permanently stored in EMR. Interpretation: No contrast injected. I personally interpreted the imaging intraoperatively. Adequate needle placement confirmed in multiple planes. Permanent images saved into the patient's record.  Post-operative Assessment:  Post-procedure Vital Signs:  Pulse/HCG Rate:    Temp:   Resp:   BP:   SpO2:    EBL: None  Complications: No immediate post-treatment complications observed by team, or reported by patient.  Note: The patient tolerated the entire procedure well. A repeat set of vitals were taken after the procedure and the patient was kept under observation following institutional policy, for this type of procedure. Post-procedural neurological assessment was performed, showing return to baseline, prior to discharge. The patient was provided with post-procedure discharge instructions, including a section on how to identify potential problems. Should any problems arise concerning this procedure, the patient was given instructions to immediately contact us, at any time, without hesitation. In any case, we plan to contact the patient by telephone for a follow-up status report regarding this interventional procedure.  Comments:  No additional relevant information.  Plan of Care (POC)  Orders:  No orders of the defined types were placed in this encounter.  Chronic Opioid Analgesic:  No opioid analgesics prescribed by our practice.  The patient received a letter from [facility] indicating that they would not be paying for her oxycodone prescriptions.   Medications ordered for procedure: No orders of the defined types were placed in this encounter.  Medications administered: [name] had no medications administered during this visit.  See the medical record for exact dosing, route, and  time of administration.  Follow-up plan:   No follow-ups on file.  Interventional Therapies  Risk Factors  Considerations:   Anticoagulation: Xarelto. (Stop: 3 days  Restart: 6 hrs)  Allergy: Iodine and radiological contrast  Implant: Internal cardiac defibrillator  History of noncompliance with post procedure follow-ups. (Prior patient of Dr. Mohammed Kindle) NOT a candidate for Lumbar RFA 2ry to Fusion Hardware & implanted Cardiac Defibrillator.      Planned  Pending:   Palliative bilateral lumbar facet MBB R11L10 (1st of 2024) She is not a good candidate for radiofrequency ablation secondary to the implanted hardware that she has in her lumbar spine.   Under consideration:   She definitely does not want to proceed with the pump. (09/02/2021)    Completed:   Therapeutic left lumbar facet MBB x9 (07/24/2022) (100/100/70/100)  Therapeutic right lumbar facet MBB x10 (07/24/2022) (100/100/70/100)  Therapeutic bilateral L5 TFESI x2 (05/31/2019) (80/80/80/>75)  Therapeutic right lumbar facet RFA x1 (02/07/2020) (DO NOT REPEAT - FUSION hardware)   Completed by other providers:   Diagnostic right lumbar facet MBB x1 (03/12/2015) (n/a) by Dr. Mohammed Kindle  Diagnostic/therapeutic right SI joint NB x3 (02/05/2015) (n/a) by Dr. Mohammed Kindle  Lumbar spine surgery: ALIF L4-5, PSF L4-S1 (Dr. Elayne Guerin) (09/29/2019)    Therapeutic  Palliative (PRN) options:   Palliative lumbar facet MBB (for low back pain)  Palliative L5 TFESI (for radicular lower extremity pain)    Pharmacotherapy  No chronic opioid analgesics therapy prescribed by our practice.        Recent Visits No visits were found meeting these conditions. Showing recent visits within past 90 days and meeting all other requirements Today's Visits Date Type Provider Dept  11/11/22 Office Visit Milinda Pointer, MD [facility]  Showing today's visits and meeting all other requirements Future  Appointments Date Type Provider Dept  11/13/22 Appointment Milinda Pointer, MD [facility]  Showing future appointments within next 90 days and meeting all other requirements  Disposition: Discharge home  Discharge (Date  Time): 11/13/2022;   hrs.   Primary Care Physician: [name], MD Location: [facility] Note by: [name], MD (TTS technology used. I apologize for any typographical errors that were not detected and corrected.) Date: 11/13/2022; Time: 3:09 PM  Disclaimer:  Medicine is not an Chief Strategy Officer. The only guarantee in medicine is that nothing is guaranteed. It is important to note that the decision to proceed with this intervention was based on the information collected from the patient. The Data and conclusions were drawn from the patient's questionnaire, the interview, and the physical examination. Because the information was provided in large part by the patient, it cannot be guaranteed that it has not been purposely or unconsciously manipulated. Every effort has been made to obtain as much relevant data as possible for this evaluation. It is important to note that the conclusions that lead to this procedure are derived in large part from the available data. Always take into account that the treatment will also be dependent on availability of resources and existing treatment guidelines, considered by other Pain Management Practitioners as being common knowledge and practice, at the time of the intervention. For Medico-Legal purposes, it is also important to point out that variation in procedural techniques and pharmacological choices are the acceptable norm. The indications, contraindications, technique, and results of the above procedure should only be interpreted and judged by a Board-Certified Interventional Pain Specialist with extensive familiarity and expertise in the same exact procedure and technique.

## 2022-11-11 NOTE — Patient Instructions (Addendum)
Facet Blocks Patient Information  Description: The facets are joints in the spine between the vertebrae.  Like any joints in the body, facets can become irritated and painful.  Arthritis can also effect the facets.  By injecting steroids and local anesthetic in and around these joints, we can temporarily block the nerve supply to them.  Steroids act directly on irritated nerves and tissues to reduce selling and inflammation which often leads to decreased pain.  Facet blocks may be done anywhere along the spine from the neck to the low back depending upon the location of your pain.   After numbing the skin with local anesthetic (like Novocaine), a small needle is passed onto the facet joints under x-ray guidance.  You may experience a sensation of pressure while this is being done.  The entire block usually lasts about 15-25 minutes.   Conditions which may be treated by facet blocks:  Low back/buttock pain Neck/shoulder pain Certain types of headaches  Preparation for the injection:  Do not eat any solid food or dairy products within 8 hours of your appointment. You may drink clear liquid up to 3 hours before appointment.  Clear liquids include water, black coffee, juice or soda.  No milk or cream please. You may take your regular medication, including pain medications, with a sip of water before your appointment.  Diabetics should hold regular insulin (if taken separately) and take 1/2 normal NPH dose the morning of the procedure.  Carry some sugar containing items with you to your appointment. A driver must accompany you and be prepared to drive you home after your procedure. Bring all your current medications with you. An IV may be inserted and sedation may be given at the discretion of the physician. A blood pressure cuff, EKG and other monitors will often be applied during the procedure.  Some patients may need to have extra oxygen administered for a short period. You will be asked to  provide medical information, including your allergies and medications, prior to the procedure.  We must know immediately if you are taking blood thinners (like Coumadin/Warfarin) or if you are allergic to IV iodine contrast (dye).  We must know if you could possible be pregnant.  Possible side-effects:  Bleeding from needle site Infection (rare, may require surgery) Nerve injury (rare) Numbness & tingling (temporary) Difficulty urinating (rare, temporary) Spinal headache (a headache worse with upright posture) Light-headedness (temporary) Pain at injection site (serveral days) Decreased blood pressure (rare, temporary) Weakness in arm/leg (temporary) Pressure sensation in back/neck (temporary)   Call if you experience:  Fever/chills associated with headache or increased back/neck pain Headache worsened by an upright position New onset, weakness or numbness of an extremity below the injection site Hives or difficulty breathing (go to the emergency room) Inflammation or drainage at the injection site(s) Severe back/neck pain greater than usual New symptoms which are concerning to you  Please note:  Although the local anesthetic injected can often make your back or neck feel good for several hours after the injection, the pain will likely return. It takes 3-7 days for steroids to work.  You may not notice any pain relief for at least one week.  If effective, we will often do a series of 2-3 injections spaced 3-6 weeks apart to maximally decrease your pain.  After the initial series, you may be a candidate for a more permanent nerve block of the facets.  If you have any questions, please call #[phone]) [facility]  Pain Clinic ______________________________________________________________________  Procedure instructions  Do not eat or drink fluids (other than water) for 6 hours before your procedure  No water for 2 hours before your procedure  Take your  blood pressure medicine with a sip of water  Arrive 30 minutes before your appointment  Carefully read the "Preparing for your procedure" detailed instructions  If you have questions call us at [phone]  _____________________________________________________________________    ______________________________________________________________________  Preparing for your procedure  Appointments: If you think you may not be able to keep your appointment, call 24-48 hours in advance to cancel. We need time to make it available to others.  During your procedure appointment there will be: No Prescription Refills. No disability issues to discussed. No medication changes or discussions.  Instructions: Food intake: Avoid eating anything solid for at least 8 hours prior to your procedure. Clear liquid intake: You may take clear liquids such as water up to 2 hours prior to your procedure. (No carbonated drinks. No soda.) Transportation: Unless otherwise stated by your physician, bring a driver. Morning Medicines: Except for blood thinners, take all of your other morning medications with a sip of water. Make sure to take your heart and blood pressure medicines. If your blood pressure's lower number is above 100, the case will be rescheduled. Blood thinners: Make sure to stop your blood thinners as instructed.  If you take a blood thinner, but were not instructed to stop it, call our office [phone] and ask to talk to a nurse. Not stopping a blood thinner prior to certain procedures could lead to serious complications. Diabetics on insulin: Notify the staff so that you can be scheduled 1st case in the morning. If your diabetes requires high dose insulin, take only  of your normal insulin dose the morning of the procedure and notify the staff that you have done so. Preventing infections: Shower with an antibacterial soap the morning of your procedure.  Build-up your immune system: Take 1000  mg of Vitamin C with every meal (3 times a day) the day prior to your procedure. Antibiotics: Inform the nursing staff if you are taking any antibiotics or if you have any conditions that may require antibiotics prior to procedures. (Example: recent joint implants)   Pregnancy: If you are pregnant make sure to notify the nursing staff. Not doing so may result in injury to the fetus, including death.  Sickness: If you have a cold, fever, or any active infections, call and cancel or reschedule your procedure. Receiving steroids while having an infection may result in complications. Arrival: You must be in the facility at least 30 minutes prior to your scheduled procedure. Tardiness: Your scheduled time is also the cutoff time. If you do not arrive at least 15 minutes prior to your procedure, you will be rescheduled.  Children: Do not bring any children with you. Make arrangements to keep them home. Dress appropriately: There is always a possibility that your clothing may get soiled. Avoid long dresses. Valuables: Do not bring any jewelry or valuables.  Reasons to call and reschedule or cancel your procedure: (Following these recommendations will minimize the risk of a serious complication.) Surgeries: Avoid having procedures within 2 weeks of any surgery. (Avoid for 2 weeks before or after any surgery). Flu Shots: Avoid having procedures within 2 weeks of a flu shots or . (Avoid for 2 weeks before or after immunizations). Barium: Avoid having a procedure within 7-10 days after having had a radiological study involving  the use of radiological contrast. (Myelograms, Barium swallow or enema study). Heart attacks: Avoid any elective procedures or surgeries for the initial 6 months after a "Myocardial Infarction" (Heart Attack). Blood thinners: It is imperative that you stop these medications before procedures. Let us know if you if you take any blood thinner.  Infection: Avoid procedures during or within  two weeks of an infection (including chest colds or gastrointestinal problems). Symptoms associated with infections include: Localized redness, fever, chills, night sweats or profuse sweating, burning sensation when voiding, cough, congestion, stuffiness, runny nose, sore throat, diarrhea, nausea, vomiting, cold or Flu symptoms, recent or current infections. It is specially important if the infection is over the area that we intend to treat. Heart and lung problems: Symptoms that may suggest an active cardiopulmonary problem include: cough, chest pain, breathing difficulties or shortness of breath, dizziness, ankle swelling, uncontrolled high or unusually low blood pressure, and/or palpitations. If you are experiencing any of these symptoms, cancel your procedure and contact your primary care physician for an evaluation.  Remember:  Regular Business hours are:  Monday to Thursday 8:00 AM to 4:00 PM  Provider's Schedule: Milinda Pointer, MD:  Procedure days: Tuesday and Thursday 7:30 AM to 4:00 PM  Gillis Santa, MD:  Procedure days: Monday and Wednesday 7:30 AM to 4:00 PM  ______________________________________________________________________    ____________________________________________________________________________________________  General Risks and Possible Complications  Patient Responsibilities: It is important that you read this as it is part of your informed consent. It is our duty to inform you of the risks and possible complications associated with treatments offered to you. It is your responsibility as a patient to read this and to ask questions about anything that is not clear or that you believe was not covered in this document.  Patient's Rights: You have the right to refuse treatment. You also have the right to change your mind, even after initially having agreed to have the treatment done. However, under this last option, if you wait until the last second to change your  mind, you may be charged for the materials used up to that point.  Introduction: Medicine is not an Chief Strategy Officer. Everything in Medicine, including the lack of treatment(s), carries the potential for danger, harm, or loss (which is by definition: Risk). In Medicine, a complication is a secondary problem, condition, or disease that can aggravate an already existing one. All treatments carry the risk of possible complications. The fact that a side effects or complications occurs, does not imply that the treatment was conducted incorrectly. It must be clearly understood that these can happen even when everything is done following the highest safety standards.  No treatment: You can choose not to proceed with the proposed treatment alternative. The "PRO(s)" would include: avoiding the risk of complications associated with the therapy. The "CON(s)" would include: not getting any of the treatment benefits. These benefits fall under one of three categories: diagnostic; therapeutic; and/or palliative. Diagnostic benefits include: getting information which can ultimately lead to improvement of the disease or symptom(s). Therapeutic benefits are those associated with the successful treatment of the disease. Finally, palliative benefits are those related to the decrease of the primary symptoms, without necessarily curing the condition (example: decreasing the pain from a flare-up of a chronic condition, such as incurable terminal cancer).  General Risks and Complications: These are associated to most interventional treatments. They can occur alone, or in combination. They fall under one of the following six (6) categories: no benefit or worsening  of symptoms; bleeding; infection; nerve damage; allergic reactions; and/or death. No benefits or worsening of symptoms: In Medicine there are no guarantees, only probabilities. No healthcare provider can ever guarantee that a medical treatment will work, they can only state  the probability that it may. Furthermore, there is always the possibility that the condition may worsen, either directly, or indirectly, as a consequence of the treatment. Bleeding: This is more common if the patient is taking a blood thinner, either prescription or over the counter (example: Goody Powders, Fish oil, Aspirin, Garlic, etc.), or if suffering a condition associated with impaired coagulation (example: Hemophilia, cirrhosis of the liver, low platelet counts, etc.). However, even if you do not have one on these, it can still happen. If you have any of these conditions, or take one of these drugs, make sure to notify your treating physician. Infection: This is more common in patients with a compromised immune system, either due to disease (example: diabetes, cancer, human immunodeficiency virus [HIV], etc.), or due to medications or treatments (example: therapies used to treat cancer and rheumatological diseases). However, even if you do not have one on these, it can still happen. If you have any of these conditions, or take one of these drugs, make sure to notify your treating physician. Nerve Damage: This is more common when the treatment is an invasive one, but it can also happen with the use of medications, such as those used in the treatment of cancer. The damage can occur to small secondary nerves, or to large primary ones, such as those in the spinal cord and brain. This damage may be temporary or permanent and it may lead to impairments that can range from temporary numbness to permanent paralysis and/or brain death. Allergic Reactions: Any time a substance or material comes in contact with our body, there is the possibility of an allergic reaction. These can range from a mild skin rash (contact dermatitis) to a severe systemic reaction (anaphylactic reaction), which can result in death. Death: In general, any medical intervention can result in death, most of the time due to an unforeseen  complication. ____________________________________________________________________________________________    ____________________________________________________________________________________________  Blood Thinners  IMPORTANT NOTICE:  If you take any of these, make sure to notify the nursing staff.  Failure to do so may result in injury.  Recommended time intervals to stop and restart blood-thinners, before & after invasive procedures  Generic Name Brand Name Pre-procedure. Stop this long before procedure. Post-procedure. Minimum waiting period before restarting.  Abciximab Reopro 15 days 2 hrs  Alteplase Activase 10 days 10 days  Anagrelide Agrylin    Apixaban Eliquis 3 days 6 hrs  Cilostazol Pletal 3 days 5 hrs  Clopidogrel Plavix 7-10 days 2 hrs  Dabigatran Pradaxa 5 days 6 hrs  Dalteparin Fragmin 24 hours 4 hrs  Dipyridamole Aggrenox 11days 2 hrs  Edoxaban Lixiana; Savaysa 3 days 2 hrs  Enoxaparin  Lovenox 24 hours 4 hrs  Eptifibatide Integrillin 8 hours 2 hrs  Fondaparinux  Arixtra 72 hours 12 hrs  Hydroxychloroquine Plaquenil 11 days   Prasugrel Effient 7-10 days 6 hrs  Reteplase Retavase 10 days 10 days  Rivaroxaban Xarelto 3 days 6 hrs  Ticagrelor Brilinta 5-7 days 6 hrs  Ticlopidine Ticlid 10-14 days 2 hrs  Tinzaparin Innohep 24 hours 4 hrs  Tirofiban Aggrastat 8 hours 2 hrs  Warfarin Coumadin 5 days 2 hrs   Other medications with blood-thinning effects  Product indications Generic (Brand) names Note  Cholesterol Lipitor Stop 4  days before procedure  Blood thinner (injectable) Heparin (LMW or LMWH Heparin) Stop 24 hours before procedure  Cancer Ibrutinib (Imbruvica) Stop 7 days before procedure  Malaria/Rheumatoid Hydroxychloroquine (Plaquenil) Stop 11 days before procedure  Thrombolytics  10 days before or after procedures   Over-the-counter (OTC) Products with blood-thinning effects  Product Common names Stop Time  Aspirin > 325 mg Goody Powders,  Excedrin, etc. 11 days  Aspirin ? 81 mg  7 days  Fish oil  4 days  Garlic supplements  7 days  Ginkgo biloba  36 hours  Ginseng  24 hours  NSAIDs Ibuprofen, Naprosyn, etc. 3 days  Vitamin E  4 days   ____________________________________________________________________________________________

## 2022-11-11 NOTE — Progress Notes (Signed)
Safety precautions to be maintained throughout the outpatient stay will include: orient to surroundings, keep bed in low position, maintain call bell within reach at all times, provide assistance with transfer out of bed and ambulation.  

## 2022-11-11 NOTE — Progress Notes (Signed)
PROVIDER NOTE: Information contained herein reflects review and annotations entered in association with encounter. Interpretation of such information and data should be left to medically-trained personnel. Information provided to patient can be located elsewhere in the medical record under "Patient Instructions". Document created using STT-dictation technology, any transcriptional errors that may result from process are unintentional.    Patient: Claire Mcdonald  Service Category: E/M  Provider: [name], MD  DOB: [birth year]  DOS: 11/11/2022  Referring Provider: [name], MD  MRN: [id number]  Specialty: Interventional Pain Management  PCP: [name], MD  Type: Established Patient  Setting: Ambulatory outpatient    Location: Office  Delivery: Face-to-face     HPI  [name], a 57 y.o. year old female, is here today because of her Chronic bilateral low back pain without sciatica [M54.50, G89.29]. [name] primary complain today is Back Pain (lower)  Pertinent problems: 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[place]); Chronic low back pain (1ry area of Pain) (Bilateral) (R>L) w/ sciatica (Bilateral); Chronic lower extremity pain (2ry area of Pain) (Bilateral) (R>L); Chronic pain syndrome; Grade 2-3 (13-16 mm) Anterolisthesis of L5/S1 (06/22/2019); Lumbar foraminal stenosis (L5-S1) (Bilateral); Chronic lumbar radicular pain (S1) (Bilateral); Chronic musculoskeletal pain; Neurogenic pain; Spinal instability of lumbosacral region (L5-S1) (11/30/17); Lumbosacral radiculopathy at S1; Female pelvic pain; Lower extremity edema; Lymphedema of both lower extremities; Restless leg syndrome; Lumbar facet syndrome (Bilateral) (R>L); Spondylosis without  myelopathy or radiculopathy, lumbosacral region; Other headache syndrome; Pars defect of lumbar spine; Myalgia; Pain in joint, lower leg; Chronic knee pain (Right); Failed back surgical syndrome; Fracture of femoral neck, right, closed ([place]); Osteoarthritis of both knees; S/P ORIF (open reduction internal fixation) fracture; Neck pain; Bilateral hip pain; Chronic low back pain (Bilateral) w/o sciatica; Patellofemoral stress syndrome; History of hemiarthroplasty of right hip; Abnormal CT scan, lumbar spine (02/25/2020); Right arm pain; Vaginal pain; Bone pain; Chronic low back pain (Right) w/o sciatica; Fusion of lumbosacral spine; Acute midline thoracic back pain; History of fusion of lumbar spine; 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 Pain Assessment: Severity of Chronic pain is reported as a 8 /10. Location: Back Left, Right/pain radiaities down both leg. Onset: More than a month ago. Quality: Aching, Throbbing, Constant. Timing: Constant. Modifying factor(s): Meds and laying down. Vitals:  height is 5\' 1"  (1.549 m) and weight is 151 lb (68.5 kg). Her temperature is 97.3 F (36.3 C) (abnormal). Her blood pressure is 127/99 (abnormal) and her pulse is 54 (abnormal). Her oxygen saturation is 98%.  BMI: Estimated body mass index is 28.53 kg/m as calculated from the following:   Height as of this encounter: 5\' 1"  (1.549 m).   Weight as of this encounter: 151 lb (68.5 kg). Last encounter: 08/07/2022. Last procedure: 07/24/2022.  Reason for encounter: evaluation of worsening, or previously known (established) problem.  The patient indicates having recurrence of her low back pain, bilaterally.  She is having some referred pain down both lower extremities through the back of the leg to just below the knee.  The pain does not follow a dermatomal/radicular pattern.  Her primary area of pain is axial.  She  continues to use their wheelchair to ambulate.  She is still taking  Xarelto, but she indicates that in the hopes that we could get her in soon as possible she did not take the Xarelto yesterday.  Will see if we can get her in on Thursday.  She describes her primary area of pain to be axial, bilaterally.  Today's physical exam was positive bilaterally for ipsilateral lumbar facet arthralgia upon performing the [facility] maneuver.  She also confirms that it is exactly the same type of pain that she normally experiences and that normally goes away with the facet blocks.  The plan is to bring her in for a palliative bilateral lumbar facet block and I will take that opportunity to check under fluoroscopic guidance the levels that are bothering her demulcent and concentrate on blocking only those.  The plan was shared with the patient who understood and accepted.  Pharmacotherapy Assessment  Analgesic: No opioid analgesics prescribed by our practice.  The patient received a letter from [facility] indicating that they would not be paying for her oxycodone prescriptions.   Monitoring: [place] PMP: PDMP reviewed during this encounter.       Pharmacotherapy: No side-effects or adverse reactions reported. Compliance: No problems identified. Effectiveness: Clinically acceptable.  Chauncey Fischer, RN  11/11/2022  2:04 PM  Sign when Signing Visit Safety precautions to be maintained throughout the outpatient stay will include: orient to surroundings, keep bed in low position, maintain call bell within reach at all times, provide assistance with transfer out of bed and ambulation.     No results found for: "CBDTHCR" No results found for: "D8THCCBX" No results found for: "D9THCCBX"  UDS:  No results found for: "SUMMARY"    ROS  Constitutional: Denies any fever or chills Gastrointestinal: No reported hemesis, hematochezia, vomiting, or acute GI distress Musculoskeletal: Denies any acute onset joint swelling, redness,  loss of ROM, or weakness Neurological: No reported episodes of acute onset apraxia, aphasia, dysarthria, agnosia, amnesia, paralysis, loss of coordination, or loss of consciousness  Medication Review  Cholecalciferol, GNP Calcium 1200, Magnesium, acetaminophen, atorvastatin, azelastine, diclofenac Sodium, famotidine, furosemide, gabapentin, isosorbide mononitrate, ketoconazole, lisinopril, metoprolol tartrate, polyethylene glycol, potassium chloride SA, rivaroxaban, senna-docusate, sertraline, topiramate, traZODone, and triamcinolone  History Review  Allergy: [name] is allergic to tape, iodinated contrast media, toradol [ketorolac tromethamine], tramadol, and zofran [ondansetron hcl]. Drug: [name]  reports no history of drug use. Alcohol:  reports no history of alcohol use. Tobacco:  reports that she has never smoked. She has never used smokeless tobacco. Social: [name]  reports that she has never smoked. She has never used smokeless tobacco. She reports that she does not drink alcohol and does not use drugs. 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 Surgical: [name]  has a past surgical history that includes Tubal ligation (1998); Ovarian cyst removal (Left); central line (03/25/2015); Hand surgery (Left); Cardiac catheterization (N/A, 03/27/2015); Cardiac catheterization (N/A, 04/11/2015); Cardiac catheterization (N/A, 07/16/2015); Colonoscopy with propofol (N/A, 10/01/2015); Esophagogastroduodenoscopy (egd) with propofol (N/A, 10/01/2015); Abdominal  hysterectomy; and Hip Arthroplasty (Right, 02/27/2020). Family: family history includes Allergies in her daughter, son, and son; Asthma in her father and mother; Breast cancer in her maternal grandmother and paternal aunt; Cancer in her maternal grandmother; Depression in  her mother; Diabetes in her father; Early death in her maternal grandmother and paternal grandmother; Heart disease in her son; Hyperlipidemia in her father and mother; Hypertension in her father and mother; Kidney disease in her father.  Laboratory Chemistry Profile   Renal Lab Results  Component Value Date   BUN 15 12/21/2021   CREATININE 0.59 12/21/2021   BCR 21 04/16/2015   GFRAA >60 03/01/2020   GFRNONAA >60 12/21/2021    Hepatic Lab Results  Component Value Date   AST 20 12/21/2021   ALT 7 12/21/2021   ALBUMIN 3.8 12/21/2021   ALKPHOS 54 12/21/2021   LIPASE 26 12/21/2021    Electrolytes Lab Results  Component Value Date   NA 139 12/21/2021   K 4.1 12/21/2021   CL 106 12/21/2021   CALCIUM 9.2 12/21/2021   MG 2.3 12/21/2021   PHOS 3.8 02/19/2017    Bone Lab Results  Component Value Date   VD25OH 19.3 (L) 11/30/2017    Inflammation (CRP: Acute Phase) (ESR: Chronic Phase) Lab Results  Component Value Date   CRP <0.8 11/30/2017   ESRSEDRATE 19 11/30/2017   LATICACIDVEN 0.8 03/26/2015         Note: Above Lab results reviewed.  Recent Imaging Review  DG PAIN CLINIC C-ARM 1-60 MIN NO REPORT Fluoro was used, but no Radiologist interpretation will be provided.  Please refer to "NOTES" tab for provider progress note. Note: Reviewed        Physical Exam  General appearance: Well nourished, well developed, and well hydrated. In no apparent acute distress Mental status: Alert, oriented x 3 (person, place, & time)       Respiratory: No evidence of acute respiratory distress Eyes: PERLA Vitals: BP (!) 127/99   Pulse (!) 54   Temp (!) 97.3 F (36.3 C)   Ht 5\' 1"  (1.549 m)   Wt 151 lb (68.5 kg)    LMP 01/08/2018   SpO2 98%   BMI 28.53 kg/m  BMI: Estimated body mass index is 28.53 kg/m as calculated from the following:   Height as of this encounter: 5\' 1"  (1.549 m).   Weight as of this encounter: 151 lb (68.5 kg). Ideal: Ideal body weight: 47.8 kg (105 lb 6.1 oz) Adjusted ideal body weight: 56.1 kg (123 lb 10 oz)  Assessment   Diagnosis Status  1. Chronic low back pain (Bilateral) w/o sciatica   2. DDD (degenerative disc disease), lumbosacral   3. Failed back surgical syndrome   4. Lumbar facet syndrome (Bilateral) (R>L)   5. Lumbar facet arthropathy   6. Grade 2-3 (13-16 mm) Anterolisthesis of L5/S1 (06/22/2019)   7. Chronic anticoagulation (Xarelto)    Worsening Deteriorating Persistent   Updated Problems: No problems updated.  Plan of Care  Problem-specific:  No problem-specific Assessment & Plan notes found for this encounter.  [name] has a current medication list which includes the following long-term medication(s): atorvastatin, furosemide, isosorbide mononitrate, metoprolol tartrate, rivaroxaban, topiramate, azelastine, gnp calcium 1200, and famotidine.  Pharmacotherapy (Medications Ordered): No orders of the defined types were placed in this encounter.  Orders:  Orders Placed This Encounter  Procedures   LUMBAR FACET(MEDIAL BRANCH NERVE BLOCK) MBNB    Diagnosis: Lumbar Facet Syndrome (M47.816); Lumbosacral Facet Syndrome (M47.817); Lumbar Facet Joint Pain (M54.59) Medical Necessity Statement: 1.Severe chronic axial low back pain causing functional impairment documented by ongoing pain scale assessments. 2.Pain present for longer than 3 months (Chronic) documented to have failed noninvasive conservative therapies. 3.Absence of  untreated radiculopathy. 4.There is no radiological evidence of untreated fractures, tumor, infection, or deformity.  Physical Examination Findings: Positive Kemp Maneuver: (Y)  Positive Lumbar Hyperextension-Rotation  provocative test: (Y)    Standing Status:   Future    Standing Expiration Date:   02/11/2023    Scheduling Instructions:     Procedure: Lumbar facet Medial Branch Block (MBB)     Side: Bilateral     Level: L3-4, L4-5, and L5-S1 Facets (L2, L3, L4, L5, and S1 Medial Branch)     Sedation: Patient's choice.     Timeframe: ASAA    Order Specific Question:   Where will this procedure be performed?    Answer:   [facility]   Blood Thinner Instructions to Nursing    Always make sure patient has clearance from prescribing physician to stop blood thinners for interventional therapies. If the patient requires a Lovenox-bridge therapy, make sure arrangements are made to institute it with the assistance of the PCP.    Scheduling Instructions:     Have [name] stop the Xarelto (Rivaroxaban) x 3 days prior to procedure or surgery.   Follow-up plan:   Return for [facility]): (B) L-FCT Blk, (Blood Thinner Protocol).      Interventional Therapies  Risk Factors  Considerations:   Anticoagulation: Xarelto. (Stop: 3 days  Restart: 6 hrs)  Allergy: Iodine and radiological contrast  Implant: Internal cardiac defibrillator  History of noncompliance with post procedure follow-ups. (Prior patient of Dr. Mohammed Kindle) NOT a candidate for Lumbar RFA 2ry to Fusion Hardware & implanted Cardiac Defibrillator.      Planned  Pending:   Palliative bilateral lumbar facet MBB R11L10 (1st of 2024) She is not a good candidate for radiofrequency ablation secondary to the implanted hardware that she has in her lumbar spine.   Under consideration:   She definitely does not want to proceed with the pump. (09/02/2021)    Completed:   Therapeutic left lumbar facet MBB x9 (07/24/2022) (100/100/70/100)  Therapeutic right lumbar facet MBB x10 (07/24/2022) (100/100/70/100)  Therapeutic bilateral L5 TFESI x2 (05/31/2019) (80/80/80/>75)  Therapeutic right lumbar facet RFA x1 (02/07/2020) (DO NOT REPEAT - FUSION  hardware)   Completed by other providers:   Diagnostic right lumbar facet MBB x1 (03/12/2015) (n/a) by Dr. Mohammed Kindle  Diagnostic/therapeutic right SI joint NB x3 (02/05/2015) (n/a) by Dr. Mohammed Kindle  Lumbar spine surgery: ALIF L4-5, PSF L4-S1 (Dr. Elayne Guerin) (09/29/2019)    Therapeutic  Palliative (PRN) options:   Palliative lumbar facet MBB (for low back pain)  Palliative L5 TFESI (for radicular lower extremity pain)    Pharmacotherapy  No chronic opioid analgesics therapy prescribed by our practice.       Recent Visits No visits were found meeting these conditions. Showing recent visits within past 90 days and meeting all other requirements Today's Visits Date Type Provider Dept  11/11/22 Office Visit Milinda Pointer, MD [facility]  Showing today's visits and meeting all other requirements Future Appointments Date Type Provider Dept  11/13/22 Appointment Milinda Pointer, MD [facility]  Showing future appointments within next 90 days and meeting all other requirements  I discussed the assessment and treatment plan with the patient. The patient was provided an opportunity to ask questions and all were answered. The patient agreed with the plan and demonstrated an understanding of the instructions.  Patient advised to call back or seek an in-person evaluation if the symptoms or condition worsens.  Duration of encounter: 30 minutes.  Total time on encounter, as per AMA guidelines included both the face-to-face and non-face-to-face time personally spent by the physician and/or other qualified health care professional(s) on the day of the encounter (includes time in activities that require the physician or other qualified health care professional and does not include time in activities normally performed by clinical staff). Physician's time may include the following activities when performed: Preparing to see the patient (e.g., pre-charting  review of records, searching for previously ordered imaging, lab work, and nerve conduction tests) Review of prior analgesic pharmacotherapies. Reviewing PMP Interpreting ordered tests (e.g., lab work, imaging, nerve conduction tests) Performing post-procedure evaluations, including interpretation of diagnostic procedures Obtaining and/or reviewing separately obtained history Performing a medically appropriate examination and/or evaluation Counseling and educating the patient/family/caregiver Ordering medications, tests, or procedures Referring and communicating with other health care professionals (when not separately reported) Documenting clinical information in the electronic or other health record Independently interpreting results (not separately reported) and communicating results to the patient/ family/caregiver Care coordination (not separately reported)  Note by: [name], MD Date: 11/11/2022; Time: 2:35 PM

## 2022-11-13 ENCOUNTER — Ambulatory Visit: Payer: Medicaid Other | Attending: Pain Medicine | Admitting: Pain Medicine

## 2022-11-13 ENCOUNTER — Ambulatory Visit
Admission: RE | Admit: 2022-11-13 | Discharge: 2022-11-13 | Disposition: A | Discharge status: Home / Self Care | Payer: Medicaid Other | Source: Ambulatory Visit | Attending: Pain Medicine | Admitting: Pain Medicine

## 2022-11-13 ENCOUNTER — Encounter: Payer: Self-pay | Admitting: Pain Medicine

## 2022-11-13 VITALS — BP 159/103 | HR 74 | Temp 97.0°F | Resp 13 | Ht 61.0 in | Wt 151.0 lb

## 2022-11-13 DIAGNOSIS — M532X7 Spinal instabilities, lumbosacral region: Secondary | ICD-10-CM | POA: Insufficient documentation

## 2022-11-13 DIAGNOSIS — G8929 Other chronic pain: Secondary | ICD-10-CM

## 2022-11-13 DIAGNOSIS — Z95810 Presence of automatic (implantable) cardiac defibrillator: Secondary | ICD-10-CM | POA: Diagnosis present

## 2022-11-13 DIAGNOSIS — M545 Low back pain, unspecified: Secondary | ICD-10-CM | POA: Diagnosis present

## 2022-11-13 DIAGNOSIS — Z7901 Long term (current) use of anticoagulants: Secondary | ICD-10-CM

## 2022-11-13 DIAGNOSIS — M47816 Spondylosis without myelopathy or radiculopathy, lumbar region: Secondary | ICD-10-CM | POA: Insufficient documentation

## 2022-11-13 DIAGNOSIS — M5137 Other intervertebral disc degeneration, lumbosacral region: Secondary | ICD-10-CM

## 2022-11-13 DIAGNOSIS — M961 Postlaminectomy syndrome, not elsewhere classified: Secondary | ICD-10-CM | POA: Diagnosis present

## 2022-11-13 DIAGNOSIS — Z91041 Radiographic dye allergy status: Secondary | ICD-10-CM | POA: Insufficient documentation

## 2022-11-13 DIAGNOSIS — M4310 Spondylolisthesis, site unspecified: Secondary | ICD-10-CM | POA: Diagnosis present

## 2022-11-13 DIAGNOSIS — M47817 Spondylosis without myelopathy or radiculopathy, lumbosacral region: Secondary | ICD-10-CM | POA: Diagnosis present

## 2022-11-13 DIAGNOSIS — Z888 Allergy status to other drugs, medicaments and biological substances status: Secondary | ICD-10-CM | POA: Diagnosis present

## 2022-11-13 MED ORDER — PENTAFLUOROPROP-TETRAFLUOROETH EX AERO
INHALATION_SPRAY | Freq: Once | CUTANEOUS | Status: AC
  Administered 2022-11-13: 30 via TOPICAL

## 2022-11-13 MED ORDER — LIDOCAINE HCL 2 % IJ SOLN
20.0000 mL | Freq: Once | INTRAMUSCULAR | Status: AC
  Administered 2022-11-13: 400 mg
  Filled 2022-11-13: qty 20

## 2022-11-13 MED ORDER — TRIAMCINOLONE ACETONIDE 40 MG/ML IJ SUSP
80.0000 mg | Freq: Once | INTRAMUSCULAR | Status: AC
  Administered 2022-11-13: 80 mg
  Filled 2022-11-13: qty 2

## 2022-11-13 MED ORDER — LACTATED RINGERS IV SOLN: Freq: Once | INTRAVENOUS | Status: AC

## 2022-11-13 MED ORDER — ROPIVACAINE HCL 2 MG/ML IJ SOLN
18.0000 mL | Freq: Once | INTRAMUSCULAR | Status: AC
  Administered 2022-11-13: 18 mL via PERINEURAL
  Filled 2022-11-13: qty 20

## 2022-11-13 MED ORDER — MIDAZOLAM HCL 2 MG/2ML IJ SOLN
0.5000 mg | Freq: Once | INTRAMUSCULAR | Status: AC
  Administered 2022-11-13: 2 mg via INTRAVENOUS
  Filled 2022-11-13: qty 2

## 2022-11-13 MED ORDER — DIPHENHYDRAMINE HCL 50 MG/ML IJ SOLN
25.0000 mg | Freq: Once | INTRAMUSCULAR | Status: AC
  Administered 2022-11-13: 25 mg via INTRAVENOUS
  Filled 2022-11-13: qty 1

## 2022-11-13 NOTE — Patient Instructions (Addendum)
____________________________________________________________________________________________  Virtual Visits   ID our calls: Add these numbers to your list of contacts on your smart phone. Label it as "PAIN Management" Nursing: [phone] (Main) Dr. Jhon Mallozzi: [phone]   What is a "Virtual Visit"? It is an electronic healthcare encounter (medical visit) that takes place on real time (NOT TEXT or E-MAIL) over the telephone or computer device (desktop, laptop, tablet, smart phone, etc.). It allows for more communication flexibility between the patient and the healthcare provider.  Who decides when these types of visits will be used? The physician.  Who is eligible for these types of visits? Only those patients that can be reliably reached over the telephone.  What do you mean by reliably? We do not have time to call everyone multiple times, therefore those patients that tend to screen calls and then call back later are not suitable candidates for this system. We all hate telemarketers and "Robocalls".  We understand how people are reluctant to pickup on calls from "unknown numbers", therefore, we suggest you add our numbers to your list of contacts. This way, you should be able to identify our calls. All of our numbers are available above.   Who is not eligible? This option is not appropriate for medication management. Patients on controlled substances have to come in for "Face-to-Face" encounters. Monitoring of these substances is mandatory. Virtual visits do not allow for unannounced drug screening tests or pill counts. Not bringing your pills, or the empty bottles, may result in no refill.  When will this type of visits be used? For follow-up after procedures on established patients, as long as they have had the same procedure done before.  Whenever you are physically unable to attend a regular appointment.   Can I request my medication visit to be "Virtual"? Yes. Available on  a limited basis, only if you are unable to physically attend your appointment. However, you may only receive a 30-day prescription. Abuse of this option may result in discontinuation of medication due to inability to properly monitor the therapy.  When will I be called?  You will receive an initial call from [phone], from our nursing staff, one business day prior to your appointment. (For Monday appointments you will be called on Friday.) The purpose of this call is to review your medications and the results of any recent procedure.  If the nursing staff is unable to make contact, your virtual encounter may be canceled and rescheduled to a face-to-face visit.  Your provider will call you on the day of your appointment.  At what time will I be called? Providers will call whenever there is time available. Do not expect calls at any specific time. On the schedule, you will have an appointment time assigned to you however, this is seldom accurate. This is done simply to keep a list of patients to be called. Be advised that calls may come at anytime during the day. Calls start as early as 8:00 AM and go as late as 8:00 PM. This will depend on provider availability. The system is not perfect. If this is inconvenient for you, please request to be changed to an "in-person" appointment.   ____________________________________________________________________________________________    ____________________________________________________________________________________________  Post-Procedure Discharge Instructions  Instructions: Apply ice:  Purpose: This will minimize any swelling and discomfort after procedure.  When: Day of procedure, as soon as you get home. How: Fill a plastic sandwich bag with crushed ice. Cover it with a small towel and apply to injection   site. How long: (15 min on, 15 min off) Apply for 15 minutes then remove x 15 minutes.  Repeat sequence on day of procedure, until you go to  bed. Apply heat:  Purpose: To treat any soreness and discomfort from the procedure. When: Starting the next day after the procedure. How: Apply heat to procedure site starting the day following the procedure. How long: May continue to repeat daily, until discomfort goes away. Food intake: Start with clear liquids (like water) and advance to regular food, as tolerated.  Physical activities: Keep activities to a minimum for the first 8 hours after the procedure. After that, then as tolerated. Driving: If you have received any sedation, be responsible and do not drive. You are not allowed to drive for 24 hours after having sedation. Blood thinner: (Applies only to those taking blood thinners) You may restart your blood thinner 6 hours after your procedure. Insulin: (Applies only to Diabetic patients taking insulin) As soon as you can eat, you may resume your normal dosing schedule. Infection prevention: Keep procedure site clean and dry. Shower daily and clean area with soap and water. Post-procedure Pain Diary: Extremely important that this be done correctly and accurately. Recorded information will be used to determine the next step in treatment. For the purpose of accuracy, follow these rules: Evaluate only the area treated. Do not report or include pain from an untreated area. For the purpose of this evaluation, ignore all other areas of pain, except for the treated area. After your procedure, avoid taking a long nap and attempting to complete the pain diary after you wake up. Instead, set your alarm clock to go off every hour, on the hour, for the initial 8 hours after the procedure. Document the duration of the numbing medicine, and the relief you are getting from it. Do not go to sleep and attempt to complete it later. It will not be accurate. If you received sedation, it is likely that you were given a medication that may cause amnesia. Because of this, completing the diary at a later time may  cause the information to be inaccurate. This information is needed to plan your care. Follow-up appointment: Keep your post-procedure follow-up evaluation appointment after the procedure (usually 2 weeks for most procedures, 6 weeks for radiofrequencies). DO NOT FORGET to bring you pain diary with you.   Expect: (What should I expect to see with my procedure?) From numbing medicine (AKA: Local Anesthetics): Numbness or decrease in pain. You may also experience some weakness, which if present, could last for the duration of the local anesthetic. Onset: Full effect within 15 minutes of injected. Duration: It will depend on the type of local anesthetic used. On the average, 1 to 8 hours.  From steroids (Applies only if steroids were used): Decrease in swelling or inflammation. Once inflammation is improved, relief of the pain will follow. Onset of benefits: Depends on the amount of swelling present. The more swelling, the longer it will take for the benefits to be seen. In some cases, up to 10 days. Duration: Steroids will stay in the system x 2 weeks. Duration of benefits will depend on multiple posibilities including persistent irritating factors. Side-effects: If present, they may typically last 2 weeks (the duration of the steroids). Frequent: Cramps (if they occur, drink Gatorade and take over-the-counter Magnesium 450-500 mg once to twice a day); water retention with temporary weight gain; increases in blood sugar; decreased immune system response; increased appetite. Occasional: Facial flushing (red, warm   cheeks); mood swings; menstrual changes. Uncommon: Long-term decrease or suppression of natural hormones; bone thinning. (These are more common with higher doses or more frequent use. This is why we prefer that our patients avoid having any injection therapies in other practices.)  Very Rare: Severe mood changes; psychosis; aseptic necrosis. From procedure: Some discomfort is to be expected once  the numbing medicine wears off. This should be minimal if ice and heat are applied as instructed.  Call if: (When should I call?) You experience numbness and weakness that gets worse with time, as opposed to wearing off. New onset bowel or bladder incontinence. (Applies only to procedures done in the spine)  Emergency Numbers: Durning business hours (Monday - Thursday, 8:00 AM - 4:00 PM) (Friday, 9:00 AM - 12:00 Noon): [phone] After hours: [phone] NOTE: If you are having a problem and are unable connect with, or to talk to a provider, then go to your nearest urgent care or emergency department. If the problem is serious and urgent, please call 911. ____________________________________________________________________________________________    

## 2022-11-14 ENCOUNTER — Telehealth: Payer: Self-pay

## 2022-11-14 NOTE — Telephone Encounter (Signed)
Post procedure follow up. Patient states she is doing fine.  

## 2022-11-27 ENCOUNTER — Ambulatory Visit: Payer: Medicaid Other | Attending: Pain Medicine | Admitting: Pain Medicine

## 2022-11-27 DIAGNOSIS — M47816 Spondylosis without myelopathy or radiculopathy, lumbar region: Secondary | ICD-10-CM | POA: Diagnosis not present

## 2022-11-27 DIAGNOSIS — M961 Postlaminectomy syndrome, not elsewhere classified: Secondary | ICD-10-CM

## 2022-11-27 DIAGNOSIS — M545 Low back pain, unspecified: Secondary | ICD-10-CM | POA: Diagnosis not present

## 2022-11-27 DIAGNOSIS — G8929 Other chronic pain: Secondary | ICD-10-CM

## 2022-11-27 NOTE — Progress Notes (Signed)
Patient: Claire Mcdonald  Service Category: E/M  Provider: [name], MD  DOB: 20-Apr-1966  DOS: 11/27/2022  Location: Office  MRN: [id number]  Setting: Ambulatory outpatient  Referring Provider: [name], MD  Type: Established Patient  Specialty: Interventional Pain Management  PCP: [name], MD  Location: Remote location  Delivery: TeleHealth     Virtual Encounter - Pain Management PROVIDER NOTE: Information contained herein reflects review and annotations entered in association with encounter. Interpretation of such information and data should be left to medically-trained personnel. Information provided to patient can be located elsewhere in the medical record under "Patient Instructions". Document created using STT-dictation technology, any transcriptional errors that may result from process are unintentional.    Contact & Pharmacy Preferred: [phone] Home: [phone] (home) Mobile: [phone] (mobile) E-mail: [name]  [place], [place] - [address] [place] [address] Phone: [phone] Fax: [phone]  [facility] - [place], [place] - [address] [place] [address] Phone: [phone] Fax: [phone]   Pre-screening  Claire Mcdonald offered "in-person" vs "virtual" encounter. She indicated preferring virtual for this encounter.   Reason COVID-19*  Social distancing based on CDC and AMA recommendations.   I contacted Claire Mcdonald on 11/27/2022 via telephone.      I clearly identified myself as [name], MD. I verified that I was speaking with the correct person using two identifiers (Name: AVIELA SALSBURY, and date of birth: 1965-12-11).  Consent I sought verbal advanced consent from Claire Mcdonald for virtual visit interactions. I informed Claire Mcdonald of possible security and privacy concerns, risks, and limitations associated with providing "not-in-person" medical evaluation  and management services. I also informed Claire Mcdonald of the availability of "in-person" appointments. Finally, I informed her that there would be a charge for the virtual visit and that she could be  personally, fully or partially, financially responsible for it. Claire Mcdonald expressed understanding and agreed to proceed.   Historic Elements   Claire Mcdonald is a 57 y.o. year old, female patient evaluated today after our last contact on 11/13/2022. 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 She also  has a past surgical history that includes Tubal ligation (1998); Ovarian cyst removal (Left); central line (03/25/2015); Hand surgery (Left); Cardiac catheterization (N/A, 03/27/2015); Cardiac catheterization (N/A, 04/11/2015); Cardiac catheterization (N/A, 07/16/2015); Colonoscopy with propofol (N/A, 10/01/2015); Esophagogastroduodenoscopy (egd) with propofol (N/A, 10/01/2015); Abdominal hysterectomy; and Hip Arthroplasty (Right, 02/27/2020). Claire Mcdonald has a current medication list which includes the following prescription(s): acetaminophen, atorvastatin, vitamin d3, diclofenac sodium, furosemide, gabapentin, isosorbide mononitrate, ketoconazole, lisinopril, magnesium, metoprolol tartrate, polyethylene glycol, potassium chloride sa, rivaroxaban, senna-docusate, sertraline, topiramate, trazodone, triamcinolone, azelastine, gnp calcium 1200, and famotidine. She  reports that she has never smoked. She has never used smokeless tobacco. She reports that she does not drink alcohol and does not use  drugs. Claire Mcdonald is allergic to tape, iodinated contrast media, toradol [ketorolac tromethamine], tramadol, and zofran [ondansetron hcl].  BMI: Estimated body mass index is 28.53 kg/m as calculated from the following:   Height as of 11/13/22: 5\' 1"  (1.549 m).  Weight as of 11/13/22: 151 lb (68.5 kg). Last encounter: 11/11/2022. Last procedure: 11/13/2022.  HPI  Today, she is being contacted for medication management.  Post-procedure evaluation   Procedure: Lumbar Facet, Medial Branch Block(s) [id number]   Laterality: Bilateral  Level: L2, L3, L4, L5, and S1 Medial Branch Level(s). Injecting these levels blocks the L3-4, L4-5, and L5-S1 lumbar facet joints. Imaging: Fluoroscopic guidance         Anesthesia: Local anesthesia (1-2% Lidocaine) Anxiolysis: IV Versed 2.0 mg + Benadryl 25mg  Sedation: No Sedation                       DOS: 11/13/2022 Performed by: [name], MD  Primary Purpose: Diagnostic/Therapeutic Indications: Low back pain severe enough to impact quality of life or function. 1. Lumbar facet syndrome (Bilateral) (R>L)   2. Lumbar facet arthropathy   3. DDD (degenerative disc disease), lumbosacral   4. 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 Spondylosis without myelopathy or radiculopathy, lumbosacral region   6. Failed back surgical syndrome   7. Grade 2-3 (13-16 mm) Anterolisthesis of L5/S1 (06/22/2019)   8. 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  Today's procedure was again a lot longer than it should have been and it unnecessarily increased exposure to the patient  as well as the staff to the radiation from fluoroscopic guidance.  For this reason, I am hesitant to offer this type of procedure to the patient again.    Effectiveness:  Initial hour after procedure: 100 %. Subsequent 4-6 hours post-procedure: 100 %. Analgesia past initial 6 hours: 50 % (current). Ongoing improvement:  Analgesic: The patient indicates currently having an ongoing 100% relief of the pain on both sides of her lower back. Function: Ms. Tuszynski reports improvement in function ROM: Ms. Laurenzo reports improvement in ROM  Pharmacotherapy Assessment   Opioid Analgesic: No opioid analgesics prescribed by our practice.  The patient received a letter from [facility] indicating that they would not be paying for her oxycodone prescriptions.   Monitoring: [place] PMP: PDMP reviewed during this encounter.       Pharmacotherapy: No side-effects or adverse reactions reported. Compliance: No problems identified. Effectiveness: Clinically acceptable. Plan: Refer to "POC". UDS: No results found for: "SUMMARY" No results found for: "CBDTHCR", "D8THCCBX", "D9THCCBX"   Laboratory Chemistry Profile   Renal Lab Results  Component Value Date   BUN 15 12/21/2021   CREATININE 0.59 12/21/2021   BCR 21 04/16/2015   GFRAA >60 03/01/2020   GFRNONAA >60 12/21/2021    Hepatic Lab Results  Component Value Date   AST 20 12/21/2021   ALT 7 12/21/2021   ALBUMIN 3.8 12/21/2021   ALKPHOS 54 12/21/2021   LIPASE 26 12/21/2021    Electrolytes Lab Results  Component Value Date   NA 139 12/21/2021   K 4.1 12/21/2021   CL 106 12/21/2021   CALCIUM 9.2 12/21/2021   MG 2.3 12/21/2021   PHOS 3.8 02/19/2017    Bone Lab Results  Component Value Date   VD25OH 19.3 (L) 11/30/2017    Inflammation (CRP:  Acute Phase) (ESR: Chronic Phase) Lab Results  Component Value Date   CRP <0.8 11/30/2017   ESRSEDRATE 19 11/30/2017   LATICACIDVEN 0.8 03/26/2015         Note: Above Lab results reviewed.  Imaging   DG PAIN CLINIC C-ARM 1-60 MIN NO REPORT Fluoro was used, but no Radiologist interpretation will be provided.  Please refer to "NOTES" tab for provider progress note.  Assessment  The primary encounter diagnosis was Chronic low back pain (Right) w/o sciatica. Diagnoses of Failed back surgical syndrome, Lumbar facet syndrome (Bilateral) (R>L), and Lumbar facet arthropathy were also pertinent to this visit.  Plan of Care  Problem-specific:  No problem-specific Assessment & Plan notes found for this encounter.  Ms. SHAUNTEA KADY has a current medication list which includes the following long-term medication(s): atorvastatin, furosemide, isosorbide mononitrate, metoprolol tartrate, rivaroxaban, topiramate, azelastine, gnp calcium 1200, and famotidine.  Pharmacotherapy (Medications Ordered): No orders of the defined types were placed in this encounter.  Orders:  No orders of the defined types were placed in this encounter.  Follow-up plan:   Return if symptoms worsen or fail to improve.      Interventional Therapies  Risk Factors  Considerations:   Anticoagulation: Xarelto. (Stop: 3 days  Restart: 6 hrs)  Allergy: Iodine and radiological contrast  Implant: Internal cardiac defibrillator  History of noncompliance with post procedure follow-ups. (Prior patient of Dr. Mohammed Kindle) NOT a candidate for Lumbar RFA 2ry to Fusion Hardware & implanted Cardiac Defibrillator.   NO MORE PROCEDURES (Non-compliant w/ request to stay still.)  (11/13/2022) Unfortunately this patient continues to be noncompliant with our intraprocedure instructions not to move.  Today's procedure was again a lot longer than it should have been and it unnecessarily increased exposure to the patient as well as the staff to the radiation from fluoroscopic guidance.  For this reason, I am hesitant to offer this type of procedure to the patient again.   Planned  Pending:   NO MORE PROCEDURES (Non-compliant w/ request to  stay still.)  (11/13/2022)  She is not a good candidate for radiofrequency ablation secondary to the implanted hardware that she has in her lumbar spine.   Under consideration:   NO MORE PROCEDURES (Non-compliant w/ request to stay still.)    Completed:   Therapeutic left lumbar facet MBB x10 (11/13/2022) (100/100/50/100)  Therapeutic right lumbar facet MBB x11 (11/13/2022) (100/100/50/100)  Therapeutic bilateral L5 TFESI x2 (05/31/2019) (80/80/80/>75)  Therapeutic right lumbar facet RFA x1 (02/07/2020) (DO NOT REPEAT - FUSION hardware)   Completed by other providers:   Diagnostic right lumbar facet MBB x1 (03/12/2015) (n/a) by Dr. Mohammed Kindle  Diagnostic/therapeutic right SI joint NB x3 (02/05/2015) (n/a) by Dr. Mohammed Kindle  Lumbar spine surgery: ALIF L4-5, PSF L4-S1 (Dr. Elayne Guerin) (09/29/2019)    Therapeutic  Palliative (PRN) options:   NO MORE PROCEDURES (Non-compliant w/ request to stay still.)    Pharmacotherapy  No chronic opioid analgesics therapy prescribed by our practice.        Recent Visits Date Type Provider Dept  11/13/22 Procedure visit Milinda Pointer, MD [facility]  11/11/22 Office Visit Milinda Pointer, MD [facility]  Showing recent visits within past 90 days and meeting all other requirements Today's Visits Date Type Provider Dept  11/27/22 Office Visit Milinda Pointer, MD [facility]  Showing today's visits and meeting all other requirements Future Appointments No visits were found meeting these conditions. Showing future appointments within next 90 days  and meeting all other requirements  I discussed the assessment and treatment plan with the patient. The patient was provided an opportunity to ask questions and all were answered. The patient agreed with the plan and demonstrated an understanding of the instructions.  Patient advised to call back or seek an in-person evaluation if the symptoms or condition  worsens.  Duration of encounter: 12 minutes.  Note by: [name], MD Date: 11/27/2022; Time: 11:57 AM

## 2023-02-09 DIAGNOSIS — M720 Palmar fascial fibromatosis [Dupuytren]: Secondary | ICD-10-CM | POA: Insufficient documentation

## 2023-02-17 ENCOUNTER — Ambulatory Visit: Payer: Medicaid Other | Attending: Pain Medicine | Admitting: Pain Medicine

## 2023-02-17 ENCOUNTER — Encounter: Payer: Self-pay | Admitting: Pain Medicine

## 2023-02-17 VITALS — BP 129/84 | HR 59 | Temp 97.8°F | Ht 61.0 in | Wt 154.0 lb

## 2023-02-17 DIAGNOSIS — M79605 Pain in left leg: Secondary | ICD-10-CM | POA: Insufficient documentation

## 2023-02-17 DIAGNOSIS — M47816 Spondylosis without myelopathy or radiculopathy, lumbar region: Secondary | ICD-10-CM | POA: Insufficient documentation

## 2023-02-17 DIAGNOSIS — M4310 Spondylolisthesis, site unspecified: Secondary | ICD-10-CM | POA: Insufficient documentation

## 2023-02-17 DIAGNOSIS — Z993 Dependence on wheelchair: Secondary | ICD-10-CM | POA: Diagnosis present

## 2023-02-17 DIAGNOSIS — M79604 Pain in right leg: Secondary | ICD-10-CM | POA: Diagnosis present

## 2023-02-17 DIAGNOSIS — M545 Low back pain, unspecified: Secondary | ICD-10-CM | POA: Diagnosis present

## 2023-02-17 DIAGNOSIS — M961 Postlaminectomy syndrome, not elsewhere classified: Secondary | ICD-10-CM | POA: Insufficient documentation

## 2023-02-17 DIAGNOSIS — M47817 Spondylosis without myelopathy or radiculopathy, lumbosacral region: Secondary | ICD-10-CM | POA: Insufficient documentation

## 2023-02-17 DIAGNOSIS — G8929 Other chronic pain: Secondary | ICD-10-CM | POA: Diagnosis present

## 2023-02-17 DIAGNOSIS — Z7901 Long term (current) use of anticoagulants: Secondary | ICD-10-CM | POA: Insufficient documentation

## 2023-02-17 DIAGNOSIS — Z91041 Radiographic dye allergy status: Secondary | ICD-10-CM | POA: Insufficient documentation

## 2023-02-17 DIAGNOSIS — Z888 Allergy status to other drugs, medicaments and biological substances status: Secondary | ICD-10-CM | POA: Diagnosis present

## 2023-02-17 NOTE — Progress Notes (Signed)
PROVIDER NOTE: Information contained herein reflects review and annotations entered in association with encounter. Interpretation of such information and data should be left to medically-trained personnel. Information provided to patient can be located elsewhere in the medical record under "Patient Instructions". Document created using STT-dictation technology, any transcriptional errors that may result from process are unintentional.    Patient: Claire Mcdonald  Service Category: E/M  Provider: Oswaldo Done, MD  DOB: 1965/12/16  DOS: 02/17/2023  Referring Provider: Rolm Bookbinder, MD  MRN: [id number]  Specialty: Interventional Pain Management  PCP: Rolm Bookbinder, MD  Type: Established Patient  Setting: Ambulatory outpatient    Location: Office  Delivery: Face-to-face     HPI  [name], a 57 y.o. year old female, is here today because of her Chronic bilateral low back pain without sciatica [M54.50, G89.29]. [name] primary complain today is Back Pain (lower)  Pertinent problems: 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HCC); Chronic low back pain (1ry area of Pain) (Bilateral) (R>L) w/ sciatica (Bilateral); Chronic lower extremity pain (2ry area of Pain) (Bilateral) (R>L); Chronic pain syndrome; Grade 2-3 (13-16 mm) Anterolisthesis of L5/S1 (06/22/2019); Lumbar foraminal stenosis (L5-S1) (Bilateral); Chronic lumbar radicular pain (S1) (Bilateral); Chronic musculoskeletal pain; Neurogenic pain; Spinal instability of lumbosacral region (L5-S1) (11/30/17); Lumbosacral radiculopathy at S1; Female pelvic pain; Lower extremity edema; Lymphedema of both lower extremities; Restless leg syndrome; Lumbar facet syndrome (Bilateral) (R>L); Spondylosis without  myelopathy or radiculopathy, lumbosacral region; Other headache syndrome; Pars defect of lumbar spine; Myalgia; Pain in joint, lower leg; Chronic knee pain (Right); Failed back surgical syndrome; Fracture of femoral neck, right, closed (HCC); Osteoarthritis of both knees; S/P ORIF (open reduction internal fixation) fracture; Neck pain; Bilateral hip pain; Chronic low back pain (Bilateral) w/o sciatica; Patellofemoral stress syndrome; History of hemiarthroplasty of right hip; Abnormal CT scan, lumbar spine (02/25/2020); Right arm pain; Vaginal pain; Bone pain; Chronic low back pain (Right) w/o sciatica; Fusion of lumbosacral spine; Acute midline thoracic back pain; History of fusion of lumbar spine; 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 Pain Assessment: Severity of Chronic pain is reported as a 8 /10. Location: Back Right/pain radaites down the right leg on the back side. Onset: More than a month ago. Quality: Aching, Burning, Constant. Timing: Constant. Modifying factor(s): nothing. Vitals:  height is 5\' 1"  (1.549 m) and weight is 154 lb (69.9 kg). Her temperature is 97.8 F (36.6 C). Her blood pressure is 129/84 and her pulse is 59 (abnormal). Her oxygen saturation is 96%.  BMI: Estimated body mass index is 29.1 kg/m as calculated from the following:   Height as of this encounter: 5\' 1"  (1.549 m).   Weight as of this encounter: 154 lb (69.9 kg). Last encounter: 11/27/2022. Last procedure: 11/13/2022.  Reason for encounter: evaluation of worsening, or previously known (established) problem.  The pain she indicates that she has not been able to sleep well secondary to her low back pain which has recurred.  She states that the last procedure that we did for her did provide her with very good relief of the pain that lasted quite some time.  Today I had a serious conversation with the patient and the fact  that I was  not really planning on repeating that type of procedure secondary to the fact that on the last 1 she was noncompliant with our request not to move.  For this reason I have informed her that should we try 1 more time, we would need to minimize the amount of sedation that she received so that she can be more compliant with our requests to stay still.  I reminded her that moving creates a situation where I have a moving target and it increases the difficulty of the procedure and it also increases not only her exposure but the exposure of my staff to radiation.  She understood and accepted.  The patient indicated that she has stopped her Xarelto for the past 2 days in the hopes that she can have this procedure done this week.  I have reminded the patient to never do that since there was no guarantee that we would be doing that procedure and every time that she stops the blood thinners she runs the risk of getting a blood clot.  She understood and accepted.  Pharmacotherapy Assessment  Analgesic: No opioid analgesics prescribed by our practice.  The patient received a letter from [facility] indicating that they would not be paying for her oxycodone prescriptions.   Monitoring: [place] PMP: PDMP reviewed during this encounter.       Pharmacotherapy: No side-effects or adverse reactions reported. Compliance: No problems identified. Effectiveness: Clinically acceptable.  Brigitte Pulse, RN  02/17/2023  1:11 PM  Sign when Signing Visit Safety precautions to be maintained throughout the outpatient stay will include: orient to surroundings, keep bed in low position, maintain call bell within reach at all times, provide assistance with transfer out of bed and ambulation.     No results found for: "CBDTHCR" No results found for: "D8THCCBX" No results found for: "D9THCCBX"  UDS:  No results found for: "SUMMARY"    ROS  Constitutional: Denies any fever or chills Gastrointestinal: No reported hemesis,  hematochezia, vomiting, or acute GI distress Musculoskeletal: Denies any acute onset joint swelling, redness, loss of ROM, or weakness Neurological: No reported episodes of acute onset apraxia, aphasia, dysarthria, agnosia, amnesia, paralysis, loss of coordination, or loss of consciousness  Medication Review  GNP Calcium 1200, Magnesium, Vitamin D3, acetaminophen, atorvastatin, azelastine, diclofenac Sodium, famotidine, furosemide, gabapentin, isosorbide mononitrate, metoprolol tartrate, polyethylene glycol, potassium chloride SA, rivaroxaban, senna-docusate, sertraline, topiramate, traZODone, and triamcinolone  History Review  Allergy: Claire Mcdonald is allergic to tape, iodinated contrast media, toradol [ketorolac tromethamine], tramadol, and zofran [ondansetron hcl]. Drug: Claire Mcdonald  reports no history of drug use. Alcohol:  reports no history of alcohol use. Tobacco:  reports that she has never smoked. She has never used smokeless tobacco. Social: Claire Mcdonald  reports that she has never smoked. She has never used smokeless tobacco. She reports that she does not drink alcohol and does not use drugs. 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 Surgical: Claire Mcdonald  has a past surgical history that includes Tubal ligation (1998); Ovarian cyst removal (Left); central line (03/25/2015); Hand surgery (Left); Cardiac catheterization (N/A, 03/27/2015); Cardiac catheterization (N/A, 04/11/2015); Cardiac catheterization (N/A, 07/16/2015); Colonoscopy with propofol (N/A, 10/01/2015);  Esophagogastroduodenoscopy (egd) with propofol (N/A,  10/01/2015); Abdominal hysterectomy; and Hip Arthroplasty (Right, 02/27/2020). Family: family history includes Allergies in her daughter, son, and son; Asthma in her father and mother; Breast cancer in her maternal grandmother and paternal aunt; Cancer in her maternal grandmother; Depression in her mother; Diabetes in her father; Early death in her maternal grandmother and paternal grandmother; Heart disease in her son; Hyperlipidemia in her father and mother; Hypertension in her father and mother; Kidney disease in her father.  Laboratory Chemistry Profile   Renal Lab Results  Component Value Date   BUN 15 12/21/2021   CREATININE 0.59 12/21/2021   BCR 21 04/16/2015   GFRAA >60 03/01/2020   GFRNONAA >60 12/21/2021    Hepatic Lab Results  Component Value Date   AST 20 12/21/2021   ALT 7 12/21/2021   ALBUMIN 3.8 12/21/2021   ALKPHOS 54 12/21/2021   LIPASE 26 12/21/2021    Electrolytes Lab Results  Component Value Date   NA 139 12/21/2021   K 4.1 12/21/2021   CL 106 12/21/2021   CALCIUM 9.2 12/21/2021   MG 2.3 12/21/2021   PHOS 3.8 02/19/2017    Bone Lab Results  Component Value Date   VD25OH 19.3 (L) 11/30/2017    Inflammation (CRP: Acute Phase) (ESR: Chronic Phase) Lab Results  Component Value Date   CRP <0.8 11/30/2017   ESRSEDRATE 19 11/30/2017   LATICACIDVEN 0.8 03/26/2015         Note: Above Lab results reviewed.  Recent Imaging Review  DG PAIN CLINIC C-ARM 1-60 MIN NO REPORT Fluoro was used, but no Radiologist interpretation will be provided.  Please refer to "NOTES" tab for provider progress note. Note: Reviewed        Physical Exam  General appearance: Well nourished, well developed, and well hydrated. In no apparent acute distress Mental status: Alert, oriented x 3 (person, place, & time)       Respiratory: No evidence of acute respiratory distress Eyes: PERLA Vitals: BP 129/84   Pulse (!) 59   Temp  97.8 F (36.6 C)   Ht 5\' 1"  (1.549 m)   Wt 154 lb (69.9 kg)   LMP 01/08/2018   SpO2 96%   BMI 29.10 kg/m  BMI: Estimated body mass index is 29.1 kg/m as calculated from the following:   Height as of this encounter: 5\' 1"  (1.549 m).   Weight as of this encounter: 154 lb (69.9 kg). Ideal: Ideal body weight: 47.8 kg (105 lb 6.1 oz) Adjusted ideal body weight: 56.6 kg (124 lb 13.2 oz)  Assessment   Diagnosis Status  1. Chronic low back pain (Bilateral) w/o sciatica   2. Lumbar facet syndrome (Bilateral) (R>L)   3. Lumbar facet arthropathy   4. Spondylosis without myelopathy or radiculopathy, lumbosacral region   5. Grade 2-3 (13-16 mm) Anterolisthesis of L5/S1 (06/22/2019)   6. Failed back surgical syndrome   7. Chronic lower extremity pain (2ry area of Pain) (Bilateral) (R>L)   8. History of allergy to radiographic contrast media   9. History of Allergy to iodine   10. Chronic anticoagulation (Xarelto)   11. Wheelchair dependent    Recurring Recurring Recurring   Updated Problems: Problem  Paralysis Due to Old Stroke IT consultant)   Last Assessment & Plan:  Formatting of this note might be different from the original. Condition is ongoing Medication and treatment plan as described in orders or med list. Counseled on etiology, treatment, warning signs. Patient education handout provided. Last Assessment & Plan: Formatting of this note  might be different from the original. - Patient here today for evaluation for wheelchair and mobility.   - Updated weight and height today.  5'2" and 149# - Given her history of a prior stroke and R-sided deficit because of that, and mobility issues, it is imperative that she have a powerchair for use in the home for assistance with her ADLs and IADLS.  Without the powerchair, she would not be able to complete these necessary tasks.  She is not able to use any other assistive device such as a cane, scooter, non-power wheelchair, or walking aid as her  muscle strength and mobility needs do not allow for movement in the house with such aids.   - The powerchair is imperative due to the fact that she is very limited in ambulation, has deficit in R-sided strength, and can only ambulate carefully short distances (less than 10 feet) with very significant risk of falling - Given her diagnoses listed above, a customized seat and powerchair are needed.   - Given her muscle weakness in her UE, she cannot tolerate using a manual wheelchair.   Migraine Headache   Overview:  Last Assessment & Plan:  - patient wanted to discuss many, many things at this visit, though I assured her we really only had time to discuss her abdominal pain.   - she mentioned worsened HAs over the last few weeks - she is already on max zoloft (for mood), more than typical max topamirate (usually 50mg  BID, but she is on 100mg  BID) for HA prophylaxis - Rx for magnesium oxide 400mg  daily for migraine ppx.  But warned that it may cause worsening of her chronic diarrhea. Last Assessment & Plan:  Takes mag 400mg  qd for migraine prophy. Refilled today.  May also help with her muscle cramps, last mag level wnl.   Formatting of this note might be different from the original. Last Assessment & Plan:  - patient wanted to discuss many, many things at this visit, though I assured her we really only had time to discuss her abdominal pain.   - she mentioned worsened HAs over the last few weeks - she is already on max zoloft (for mood), more than typical max topamirate (usually 50mg  BID, but she is on 100mg  BID) for HA prophylaxis - Rx for magnesium oxide 400mg  daily for migraine ppx.  But warned that it may cause worsening of her chronic diarrhea. Last Assessment & Plan:  Formatting of this note might be different from the original. Takes mag 400mg  qd for migraine prophy. Refilled today.  May also help with her muscle cramps, last mag level wnl. Last Assessment & Plan: Formatting of this note  might be different from the original. Condition is ongoing Medication and treatment plan as described in orders or med list. Counseled on etiology, treatment, warning signs. Provided information regarding diagnoses and treatment plans   Wheelchair Dependent   Last Assessment & Plan: Formatting of this note might be different from the original. 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 Condition is ongoing Medication and treatment plan as described in orders or med list. Counseled on etiology, treatment, warning signs. Patient education handout provided. Maintain appointment with Surgery Last Assessment & Plan: Formatting of  this note might be different from the original. Condition is ongoing Medication and treatment plan as described in orders or med list. Counseled on etiology, treatment, warning signs. 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 Condition is ongoing Medication and treatment plan as described in orders or med list. Counseled on etiology, treatment, warning signs. Provided information regarding diagnoses and treatment plans     Plan of Care  Problem-specific:  No problem-specific Assessment & Plan notes found for this encounter.  Claire Mcdonald has a current medication list which includes the following long-term medication(s): atorvastatin, furosemide, isosorbide mononitrate, metoprolol tartrate, rivaroxaban, topiramate, azelastine, gnp calcium 1200, and famotidine.  Pharmacotherapy (Medications Ordered): No orders of the defined types were placed in this  encounter.  Orders:  Orders Placed This Encounter  Procedures   LUMBAR FACET(MEDIAL BRANCH NERVE BLOCK) MBNB    Scheduling Instructions:     Procedure: Lumbar facet block (AKA.: Lumbosacral medial branch nerve block)     Side: Bilateral     Level: L3-4, L4-5, L5-S1, and TBD Facets (L2, L3, L4, L5, S1, and TBD Medial Branch Nerves)     Sedation: Patient's choice.     Timeframe: Today    Order Specific Question:   Where will this procedure be performed?    Answer:   [facility]   Blood Thinner Instructions to Nursing    Always make sure patient has clearance from prescribing physician to stop blood thinners for interventional therapies. If the patient requires a Lovenox-bridge therapy, make sure arrangements are made to institute it with the assistance of the PCP.    Scheduling Instructions:     Have Claire Mcdonald stop the Xarelto (Rivaroxaban) x 3 days prior to procedure or surgery.   Informed Consent Details: Physician/Practitioner Attestation; Transcribe to consent form and obtain patient signature    Nursing Order: Transcribe to consent form and obtain patient signature. Note: Always confirm laterality of pain with Claire Mcdonald, before procedure.    Order Specific Question:   Physician/Practitioner attestation of informed consent for procedure/surgical case    Answer:   I, the physician/practitioner, attest that I have discussed with the patient the benefits, risks, side effects, alternatives, likelihood of achieving goals and potential problems during recovery for the procedure that I have provided informed consent.    Order Specific Question:   Procedure    Answer:   Lumbar Facet Block  under fluoroscopic guidance    Order Specific Question:   Physician/Practitioner performing the procedure    Answer:   Riely Oetken A. Laban Emperor MD    Order Specific Question:   Indication/Reason    Answer:   Low Back Pain, with our without leg pain, due to Facet Joint Arthralgia (Joint Pain) Spondylosis  (Arthritis of the Spine), without myelopathy or radiculopathy (Nerve Damage).   Follow-up plan:   Return for [facility]): (B) L-FCT Blk #11, (Blood Thinner Protocol).      Interventional Therapies  Risk Factors  Considerations:   Anticoagulation: Xarelto. (Stop: 3 days  Restart: 6 hrs)  Allergy: Iodine and radiological contrast  Implant: Internal cardiac defibrillator  History of noncompliance with post procedure follow-ups. (Prior patient of Dr. Ewing Schlein) NOT a candidate for Lumbar RFA 2ry to Fusion Hardware & implanted Cardiac Defibrillator.   NO MORE PROCEDURES (Non-compliant w/ request to stay still.)  (11/13/2022) Unfortunately this  patient continues to be noncompliant with our intraprocedure instructions not to move.  Today's procedure was again a lot longer than it should have been and it unnecessarily increased exposure to the patient as well as the staff to the radiation from fluoroscopic guidance.  For this reason, I am hesitant to offer this type of procedure to the patient again.   Planned  Pending:   NO MORE PROCEDURES (Non-compliant w/ request to stay still.)  (11/13/2022)  She is not a good candidate for radiofrequency ablation secondary to the implanted hardware that she has in her lumbar spine.   Under consideration:   NO MORE PROCEDURES (Non-compliant w/ request to stay still.)    Completed:   Therapeutic left lumbar facet MBB x10 (11/13/2022) (100/100/50/100)  Therapeutic right lumbar facet MBB x11 (11/13/2022) (100/100/50/100)  Therapeutic bilateral L5 TFESI x2 (05/31/2019) (80/80/80/>75)  Therapeutic right lumbar facet RFA x1 (02/07/2020) (DO NOT REPEAT - FUSION hardware)   Completed by other providers:   Diagnostic right lumbar facet MBB x1 (03/12/2015) (n/a) by Dr. Ewing Schlein  Diagnostic/therapeutic right SI joint NB x3 (02/05/2015) (n/a) by Dr. Ewing Schlein  Lumbar spine surgery: ALIF L4-5, PSF L4-S1 (Dr. Volanda Napoleon) (09/29/2019)    Therapeutic   Palliative (PRN) options:   NO MORE PROCEDURES (Non-compliant w/ request to stay still.)    Pharmacotherapy  No chronic opioid analgesics therapy prescribed by our practice.         Recent Visits Date Type Provider Dept  11/27/22 Office Visit Delano Metz, MD [facility]  Showing recent visits within past 90 days and meeting all other requirements Today's Visits Date Type Provider Dept  02/17/23 Office Visit Delano Metz, MD [facility]  Showing today's visits and meeting all other requirements Future Appointments No visits were found meeting these conditions. Showing future appointments within next 90 days and meeting all other requirements  I discussed the assessment and treatment plan with the patient. The patient was provided an opportunity to ask questions and all were answered. The patient agreed with the plan and demonstrated an understanding of the instructions.  Patient advised to call back or seek an in-person evaluation if the symptoms or condition worsens.  Duration of encounter: 30 minutes.  Total time on encounter, as per AMA guidelines included both the face-to-face and non-face-to-face time personally spent by the physician and/or other qualified health care professional(s) on the day of the encounter (includes time in activities that require the physician or other qualified health care professional and does not include time in activities normally performed by clinical staff). Physician's time may include the following activities when performed: Preparing to see the patient (e.g., pre-charting review of records, searching for previously ordered imaging, lab work, and nerve conduction tests) Review of prior analgesic pharmacotherapies. Reviewing PMP Interpreting ordered tests (e.g., lab work, imaging, nerve conduction tests) Performing post-procedure evaluations, including interpretation of diagnostic procedures Obtaining and/or  reviewing separately obtained history Performing a medically appropriate examination and/or evaluation Counseling and educating the patient/family/caregiver Ordering medications, tests, or procedures Referring and communicating with other health care professionals (when not separately reported) Documenting clinical information in the electronic or other health record Independently interpreting results (not separately reported) and communicating results to the patient/ family/caregiver Care coordination (not separately reported)  Note by: Oswaldo Done, MD Date: 02/17/2023; Time: 1:33 PM

## 2023-02-17 NOTE — Progress Notes (Signed)
Safety precautions to be maintained throughout the outpatient stay will include: orient to surroundings, keep bed in low position, maintain call bell within reach at all times, provide assistance with transfer out of bed and ambulation.  

## 2023-02-17 NOTE — Patient Instructions (Signed)
____________________________________________________________________________________________  Blood Thinners  IMPORTANT NOTICE:  If you take any of these, make sure to notify the nursing staff.  Failure to do so may result in injury.  Recommended time intervals to stop and restart blood-thinners, before & after invasive procedures  Generic Name Brand Name Pre-procedure. Stop this long before procedure. Post-procedure. Minimum waiting period before restarting.  Abciximab Reopro 15 days 2 hrs  Alteplase Activase 10 days 10 days  Anagrelide Agrylin    Apixaban Eliquis 3 days 6 hrs  Cilostazol Pletal 3 days 5 hrs  Clopidogrel Plavix 7-10 days 2 hrs  Dabigatran Pradaxa 5 days 6 hrs  Dalteparin Fragmin 24 hours 4 hrs  Dipyridamole Aggrenox 11days 2 hrs  Edoxaban Lixiana; Savaysa 3 days 2 hrs  Enoxaparin  Lovenox 24 hours 4 hrs  Eptifibatide Integrillin 8 hours 2 hrs  Fondaparinux  Arixtra 72 hours 12 hrs  Hydroxychloroquine Plaquenil 11 days   Prasugrel Effient 7-10 days 6 hrs  Reteplase Retavase 10 days 10 days  Rivaroxaban Xarelto 3 days 6 hrs  Ticagrelor Brilinta 5-7 days 6 hrs  Ticlopidine Ticlid 10-14 days 2 hrs  Tinzaparin Innohep 24 hours 4 hrs  Tirofiban Aggrastat 8 hours 2 hrs  Warfarin Coumadin 5 days 2 hrs   Other medications with blood-thinning effects  Product indications Generic (Brand) names Note  Cholesterol Lipitor Stop 4 days before procedure  Blood thinner (injectable) Heparin (LMW or LMWH Heparin) Stop 24 hours before procedure  Cancer Ibrutinib (Imbruvica) Stop 7 days before procedure  Malaria/Rheumatoid Hydroxychloroquine (Plaquenil) Stop 11 days before procedure  Thrombolytics  10 days before or after procedures   Over-the-counter (OTC) Products with blood-thinning effects  Product Common names Stop Time  Aspirin > 325 mg Goody Powders, Excedrin, etc. 11 days  Aspirin ? 81 mg  7 days  Fish oil  4 days  Garlic supplements  7 days  Ginkgo biloba  36  hours  Ginseng  24 hours  NSAIDs Ibuprofen, Naprosyn, etc. 3 days  Vitamin E  4 days   ____________________________________________________________________________________________    ______________________________________________________________________  Procedure instructions  Do not eat or drink fluids (other than water) for 6 hours before your procedure  No water for 2 hours before your procedure  Take your blood pressure medicine with a sip of water  Arrive 30 minutes before your appointment  Carefully read the "Preparing for your procedure" detailed instructions  If you have questions call us at [phone]  _____________________________________________________________________    ______________________________________________________________________  Preparing for your procedure  Appointments: If you think you may not be able to keep your appointment, call 24-48 hours in advance to cancel. We need time to make it available to others.  During your procedure appointment there will be: No Prescription Refills. No disability issues to discussed. No medication changes or discussions.  Instructions: Food intake: Avoid eating anything solid for at least 8 hours prior to your procedure. Clear liquid intake: You may take clear liquids such as water up to 2 hours prior to your procedure. (No carbonated drinks. No soda.) Transportation: Unless otherwise stated by your physician, bring a driver. Morning Medicines: Except for blood thinners, take all of your other morning medications with a sip of water. Make sure to take your heart and blood pressure medicines. If your blood pressure's lower number is above 100, the case will be rescheduled. Blood thinners: Make sure to stop your blood thinners as instructed.  If you take a blood thinner, but were not instructed to stop it,   call our office [phone] and ask to talk to a nurse. Not stopping a blood thinner prior to certain  procedures could lead to serious complications. Diabetics on insulin: Notify the staff so that you can be scheduled 1st case in the morning. If your diabetes requires high dose insulin, take only  of your normal insulin dose the morning of the procedure and notify the staff that you have done so. Preventing infections: Shower with an antibacterial soap the morning of your procedure.  Build-up your immune system: Take 1000 mg of Vitamin C with every meal (3 times a day) the day prior to your procedure. Antibiotics: Inform the nursing staff if you are taking any antibiotics or if you have any conditions that may require antibiotics prior to procedures. (Example: recent joint implants)   Pregnancy: If you are pregnant make sure to notify the nursing staff. Not doing so may result in injury to the fetus, including death.  Sickness: If you have a cold, fever, or any active infections, call and cancel or reschedule your procedure. Receiving steroids while having an infection may result in complications. Arrival: You must be in the facility at least 30 minutes prior to your scheduled procedure. Tardiness: Your scheduled time is also the cutoff time. If you do not arrive at least 15 minutes prior to your procedure, you will be rescheduled.  Children: Do not bring any children with you. Make arrangements to keep them home. Dress appropriately: There is always a possibility that your clothing may get soiled. Avoid long dresses. Valuables: Do not bring any jewelry or valuables.  Reasons to call and reschedule or cancel your procedure: (Following these recommendations will minimize the risk of a serious complication.) Surgeries: Avoid having procedures within 2 weeks of any surgery. (Avoid for 2 weeks before or after any surgery). Flu Shots: Avoid having procedures within 2 weeks of a flu shots or . (Avoid for 2 weeks before or after immunizations). Barium: Avoid having a procedure within 7-10 days after having  had a radiological study involving the use of radiological contrast. (Myelograms, Barium swallow or enema study). Heart attacks: Avoid any elective procedures or surgeries for the initial 6 months after a "Myocardial Infarction" (Heart Attack). Blood thinners: It is imperative that you stop these medications before procedures. Let us know if you if you take any blood thinner.  Infection: Avoid procedures during or within two weeks of an infection (including chest colds or gastrointestinal problems). Symptoms associated with infections include: Localized redness, fever, chills, night sweats or profuse sweating, burning sensation when voiding, cough, congestion, stuffiness, runny nose, sore throat, diarrhea, nausea, vomiting, cold or Flu symptoms, recent or current infections. It is specially important if the infection is over the area that we intend to treat. Heart and lung problems: Symptoms that may suggest an active cardiopulmonary problem include: cough, chest pain, breathing difficulties or shortness of breath, dizziness, ankle swelling, uncontrolled high or unusually low blood pressure, and/or palpitations. If you are experiencing any of these symptoms, cancel your procedure and contact your primary care physician for an evaluation.  Remember:  Regular Business hours are:  Monday to Thursday 8:00 AM to 4:00 PM  Provider's Schedule: Neville Walston, MD:  Procedure days: Tuesday and Thursday 7:30 AM to 4:00 PM  Bilal Lateef, MD:  Procedure days: Monday and Wednesday 7:30 AM to 4:00 PM  ______________________________________________________________________    ____________________________________________________________________________________________  General Risks and Possible Complications  Patient Responsibilities: It is important that you read this as it   is part of your informed consent. It is our duty to inform you of the risks and possible complications associated with treatments  offered to you. It is your responsibility as a patient to read this and to ask questions about anything that is not clear or that you believe was not covered in this document.  Patient's Rights: You have the right to refuse treatment. You also have the right to change your mind, even after initially having agreed to have the treatment done. However, under this last option, if you wait until the last second to change your mind, you may be charged for the materials used up to that point.  Introduction: Medicine is not an exact science. Everything in Medicine, including the lack of treatment(s), carries the potential for danger, harm, or loss (which is by definition: Risk). In Medicine, a complication is a secondary problem, condition, or disease that can aggravate an already existing one. All treatments carry the risk of possible complications. The fact that a side effects or complications occurs, does not imply that the treatment was conducted incorrectly. It must be clearly understood that these can happen even when everything is done following the highest safety standards.  No treatment: You can choose not to proceed with the proposed treatment alternative. The "PRO(s)" would include: avoiding the risk of complications associated with the therapy. The "CON(s)" would include: not getting any of the treatment benefits. These benefits fall under one of three categories: diagnostic; therapeutic; and/or palliative. Diagnostic benefits include: getting information which can ultimately lead to improvement of the disease or symptom(s). Therapeutic benefits are those associated with the successful treatment of the disease. Finally, palliative benefits are those related to the decrease of the primary symptoms, without necessarily curing the condition (example: decreasing the pain from a flare-up of a chronic condition, such as incurable terminal cancer).  General Risks and Complications: These are associated to most  interventional treatments. They can occur alone, or in combination. They fall under one of the following six (6) categories: no benefit or worsening of symptoms; bleeding; infection; nerve damage; allergic reactions; and/or death. No benefits or worsening of symptoms: In Medicine there are no guarantees, only probabilities. No healthcare provider can ever guarantee that a medical treatment will work, they can only state the probability that it may. Furthermore, there is always the possibility that the condition may worsen, either directly, or indirectly, as a consequence of the treatment. Bleeding: This is more common if the patient is taking a blood thinner, either prescription or over the counter (example: Goody Powders, Fish oil, Aspirin, Garlic, etc.), or if suffering a condition associated with impaired coagulation (example: Hemophilia, cirrhosis of the liver, low platelet counts, etc.). However, even if you do not have one on these, it can still happen. If you have any of these conditions, or take one of these drugs, make sure to notify your treating physician. Infection: This is more common in patients with a compromised immune system, either due to disease (example: diabetes, cancer, human immunodeficiency virus [HIV], etc.), or due to medications or treatments (example: therapies used to treat cancer and rheumatological diseases). However, even if you do not have one on these, it can still happen. If you have any of these conditions, or take one of these drugs, make sure to notify your treating physician. Nerve Damage: This is more common when the treatment is an invasive one, but it can also happen with the use of medications, such as those used in the treatment   of cancer. The damage can occur to small secondary nerves, or to large primary ones, such as those in the spinal cord and brain. This damage may be temporary or permanent and it may lead to impairments that can range from temporary numbness to  permanent paralysis and/or brain death. Allergic Reactions: Any time a substance or material comes in contact with our body, there is the possibility of an allergic reaction. These can range from a mild skin rash (contact dermatitis) to a severe systemic reaction (anaphylactic reaction), which can result in death. Death: In general, any medical intervention can result in death, most of the time due to an unforeseen complication. ____________________________________________________________________________________________    

## 2023-02-19 ENCOUNTER — Ambulatory Visit
Admission: RE | Admit: 2023-02-19 | Discharge: 2023-02-19 | Disposition: A | Discharge status: Home / Self Care | Payer: Medicaid Other | Source: Ambulatory Visit | Attending: Pain Medicine | Admitting: Pain Medicine

## 2023-02-19 ENCOUNTER — Encounter: Payer: Self-pay | Admitting: Pain Medicine

## 2023-02-19 ENCOUNTER — Ambulatory Visit: Payer: Medicaid Other | Attending: Pain Medicine | Admitting: Pain Medicine

## 2023-02-19 VITALS — BP 145/96 | HR 53 | Temp 97.3°F | Resp 16 | Ht 61.0 in | Wt 154.0 lb

## 2023-02-19 DIAGNOSIS — M4310 Spondylolisthesis, site unspecified: Secondary | ICD-10-CM | POA: Insufficient documentation

## 2023-02-19 DIAGNOSIS — M47816 Spondylosis without myelopathy or radiculopathy, lumbar region: Secondary | ICD-10-CM | POA: Diagnosis present

## 2023-02-19 DIAGNOSIS — G8929 Other chronic pain: Secondary | ICD-10-CM | POA: Insufficient documentation

## 2023-02-19 DIAGNOSIS — M5137 Other intervertebral disc degeneration, lumbosacral region: Secondary | ICD-10-CM | POA: Insufficient documentation

## 2023-02-19 DIAGNOSIS — Z981 Arthrodesis status: Secondary | ICD-10-CM | POA: Insufficient documentation

## 2023-02-19 DIAGNOSIS — T85848S Pain due to other internal prosthetic devices, implants and grafts, sequela: Secondary | ICD-10-CM | POA: Insufficient documentation

## 2023-02-19 DIAGNOSIS — Z5189 Encounter for other specified aftercare: Secondary | ICD-10-CM | POA: Diagnosis present

## 2023-02-19 DIAGNOSIS — Z7901 Long term (current) use of anticoagulants: Secondary | ICD-10-CM | POA: Insufficient documentation

## 2023-02-19 DIAGNOSIS — M545 Low back pain, unspecified: Secondary | ICD-10-CM | POA: Insufficient documentation

## 2023-02-19 DIAGNOSIS — M5459 Other low back pain: Secondary | ICD-10-CM | POA: Diagnosis present

## 2023-02-19 MED ORDER — PENTAFLUOROPROP-TETRAFLUOROETH EX AERO
INHALATION_SPRAY | Freq: Once | CUTANEOUS | Status: DC
  Filled 2023-02-19: qty 116

## 2023-02-19 MED ORDER — TRIAMCINOLONE ACETONIDE 40 MG/ML IJ SUSP
80.0000 mg | Freq: Once | INTRAMUSCULAR | Status: AC
  Administered 2023-02-19: 80 mg
  Filled 2023-02-19: qty 2

## 2023-02-19 MED ORDER — LIDOCAINE HCL 2 % IJ SOLN
20.0000 mL | Freq: Once | INTRAMUSCULAR | Status: AC
  Administered 2023-02-19: 400 mg
  Filled 2023-02-19: qty 20

## 2023-02-19 MED ORDER — MIDAZOLAM HCL 2 MG/2ML IJ SOLN
0.5000 mg | Freq: Once | INTRAMUSCULAR | Status: AC
  Administered 2023-02-19: 1 mg via INTRAVENOUS
  Filled 2023-02-19: qty 2

## 2023-02-19 MED ORDER — ROPIVACAINE HCL 2 MG/ML IJ SOLN
18.0000 mL | Freq: Once | INTRAMUSCULAR | Status: AC
  Administered 2023-02-19: 18 mL via PERINEURAL
  Filled 2023-02-19: qty 20

## 2023-02-19 MED ORDER — LACTATED RINGERS IV SOLN: Freq: Once | INTRAVENOUS | Status: AC

## 2023-02-19 NOTE — Patient Instructions (Signed)

## 2023-02-19 NOTE — Progress Notes (Signed)
Safety precautions to be maintained throughout the outpatient stay will include: orient to surroundings, keep bed in low position, maintain call bell within reach at all times, provide assistance with transfer out of bed and ambulation.  

## 2023-02-19 NOTE — Progress Notes (Signed)
PROVIDER NOTE: Interpretation of information contained herein should be left to medically-trained personnel. Specific patient instructions are provided elsewhere under "Patient Instructions" section of medical record. This document was created in part using STT-dictation technology, any transcriptional errors that may result from this process are unintentional.  Patient: Claire Mcdonald Type: Established DOB: [birth year] MRN: [id number] PCP: Rolm Bookbinder, MD  Service: Procedure DOS: 02/19/2023 Setting: Ambulatory Location: Ambulatory outpatient facility Delivery: Face-to-face Provider: Oswaldo Done, MD Specialty: Interventional Pain Management Specialty designation: 09 Location: Outpatient facility Ref. Prov.: Rolm Bookbinder, MD       Interventional Therapy   Procedure: Lumbar Facet, Medial Branch Block(s)  #11  (2nd of 2024) Laterality: Bilateral  Level: L2, L3, L4, L5, and S1 Medial Branch Level(s). Injecting these levels blocks the L3-4, L4-5, and L5-S1 lumbar facet joints. Imaging: Fluoroscopic guidance         Anesthesia: Local anesthesia (1-2% Lidocaine) Anxiolysis: IV Versed         Sedation: No Sedation                       DOS: 02/19/2023 Performed by: Oswaldo Done, MD  Primary Purpose: Diagnostic/Therapeutic Indications: Low back pain severe enough to impact quality of life or function. 1. Chronic low back pain (Bilateral) w/o sciatica   2. Lumbar facet syndrome (Bilateral) (R>L)   3. Lumbar facet arthropathy   4. Grade 2-3 (13-16 mm) Anterolisthesis of L5/S1 (06/22/2019)   5. DDD (degenerative disc disease), lumbosacral   6. Lumbar facet joint pain   7. Implanted hardware pain (pedicle screws)   8. History of fusion of lumbar spine    NAS-11 Pain score:   Pre-procedure: 9 /10   Post-procedure: 0-No pain/10     Position / Prep / Materials:  Position: Prone  Prep solution: DuraPrep (Iodine Povacrylex [0.7% available iodine] and Isopropyl Alcohol, 74%  w/w) Area Prepped: Posterolateral Lumbosacral Spine (Wide prep: From the lower border of the scapula down to the end of the tailbone and from flank to flank.)  Materials:  Tray: Block Needle(s):  Type: Spinal  Gauge (G): 22  Length: 3.5-in Qty: 4      Pre-op H&P Assessment:  Claire Mcdonald is a 57 y.o. (year old), female patient, seen today for interventional treatment. She  has a past surgical history that includes Tubal ligation (1998); Ovarian cyst removal (Left); central line (03/25/2015); Hand surgery (Left); Cardiac catheterization (N/A, 03/27/2015); Cardiac catheterization (N/A, 04/11/2015); Cardiac catheterization (N/A, 07/16/2015); Colonoscopy with propofol (N/A, 10/01/2015); Esophagogastroduodenoscopy (egd) with propofol (N/A, 10/01/2015); Abdominal hysterectomy; and Hip Arthroplasty (Right, 02/27/2020). [name] has a current medication list which includes the following prescription(s): acetaminophen, atorvastatin, vitamin d3, diclofenac sodium, furosemide, gabapentin, isosorbide mononitrate, magnesium, metoprolol tartrate, polyethylene glycol, potassium chloride sa, rivaroxaban, senna-docusate, sertraline, topiramate, trazodone, triamcinolone, azelastine, gnp calcium 1200, and famotidine, and the following Facility-Administered Medications: lactated ringers and pentafluoroprop-tetrafluoroeth. Her primarily concern today is the Back Pain (lower)  Initial Vital Signs:  Pulse/HCG Rate: (!) 52ECG Heart Rate: 62 Temp: (!) 97.3 F (36.3 C) Resp: 16 BP: (!) 161/102 SpO2: 98 %  BMI: Estimated body mass index is 29.1 kg/m as calculated from the following:   Height as of this encounter: 5\' 1"  (1.549 m).   Weight as of this encounter: 154 lb (69.9 kg).  Risk Assessment: Allergies: Reviewed. She is allergic to tape, iodinated contrast media, toradol [ketorolac tromethamine], tramadol, and zofran [ondansetron hcl].  Allergy Precautions: None required Coagulopathies: Reviewed. None identified.  Blood-thinner therapy: None at this time Active Infection(s): Reviewed. None identified. Ms. Averitt is afebrile  Site Confirmation: [name] was asked to confirm the procedure and laterality before marking the site Procedure checklist: Completed Consent: Before the procedure and under the influence of no sedative(s), amnesic(s), or anxiolytics, the patient was informed of the treatment options, risks and possible complications. To fulfill our ethical and legal obligations, as recommended by the American Medical Association's Code of Ethics, I have informed the patient of my clinical impression; the nature and purpose of the treatment or procedure; the risks, benefits, and possible complications of the intervention; the alternatives, including doing nothing; the risk(s) and benefit(s) of the alternative treatment(s) or procedure(s); and the risk(s) and benefit(s) of doing nothing. The patient was provided information about the general risks and possible complications associated with the procedure. These may include, but are not limited to: failure to achieve desired goals, infection, bleeding, organ or nerve damage, allergic reactions, paralysis, and death. In addition, the patient was informed of those risks and complications associated to Spine-related procedures, such as failure to decrease pain; infection (i.e.: Meningitis, epidural or intraspinal abscess); bleeding (i.e.: epidural hematoma, subarachnoid hemorrhage, or any other type of intraspinal or peri-dural bleeding); organ or nerve damage (i.e.: Any type of peripheral nerve, nerve root, or spinal cord injury) with subsequent damage to sensory, motor, and/or autonomic systems, resulting in permanent pain, numbness, and/or weakness of one or several areas of the body; allergic reactions; (i.e.: anaphylactic reaction); and/or death. Furthermore, the patient was informed of those risks and complications associated with the medications. These include,  but are not limited to: allergic reactions (i.e.: anaphylactic or anaphylactoid reaction(s)); adrenal axis suppression; blood sugar elevation that in diabetics may result in ketoacidosis or comma; water retention that in patients with history of congestive heart failure may result in shortness of breath, pulmonary edema, and decompensation with resultant heart failure; weight gain; swelling or edema; medication-induced neural toxicity; particulate matter embolism and blood vessel occlusion with resultant organ, and/or nervous system infarction; and/or aseptic necrosis of one or more joints. Finally, the patient was informed that Medicine is not an exact science; therefore, there is also the possibility of unforeseen or unpredictable risks and/or possible complications that may result in a catastrophic outcome. The patient indicated having understood very clearly. We have given the patient no guarantees and we have made no promises. Enough time was given to the patient to ask questions, all of which were answered to the patient's satisfaction. Ms. Sloma has indicated that she wanted to continue with the procedure. Attestation: I, the ordering provider, attest that I have discussed with the patient the benefits, risks, side-effects, alternatives, likelihood of achieving goals, and potential problems during recovery for the procedure that I have provided informed consent. Date  Time: 02/19/2023  9:44 AM   Pre-Procedure Preparation:  Monitoring: As per clinic protocol. Respiration, ETCO2, SpO2, BP, heart rate and rhythm monitor placed and checked for adequate function Safety Precautions: Patient was assessed for positional comfort and pressure points before starting the procedure. Time-out: I initiated and conducted the "Time-out" before starting the procedure, as per protocol. The patient was asked to participate by confirming the accuracy of the "Time Out" information. Verification of the correct person, site,  and procedure were performed and confirmed by me, the nursing staff, and the patient. "Time-out" conducted as per Joint Commission's Universal Protocol (UP.01.01.01). Time: 1110 Start Time: 1110 hrs.  Description of Procedure:  Laterality: (see above) Targeted Levels: (see above)  Safety Precautions: Aspiration looking for blood return was conducted prior to all injections. At no point did we inject any substances, as a needle was being advanced. Before injecting, the patient was told to immediately notify me if she was experiencing any new onset of "ringing in the ears, or metallic taste in the mouth". No attempts were made at seeking any paresthesias. Safe injection practices and needle disposal techniques used. Medications properly checked for expiration dates. SDV (single dose vial) medications used. After the completion of the procedure, all disposable equipment used was discarded in the proper designated medical waste containers. Local Anesthesia: Protocol guidelines were followed. The patient was positioned over the fluoroscopy table. The area was prepped in the usual manner. The time-out was completed. The target area was identified using fluoroscopy. A 12-in long, straight, sterile hemostat was used with fluoroscopic guidance to locate the targets for each level blocked. Once located, the skin was marked with an approved surgical skin marker. Once all sites were marked, the skin (epidermis, dermis, and hypodermis), as well as deeper tissues (fat, connective tissue and muscle) were infiltrated with a small amount of a short-acting local anesthetic, loaded on a 10cc syringe with a 25G, 1.5-in  Needle. An appropriate amount of time was allowed for local anesthetics to take effect before proceeding to the next step. Local Anesthetic: Lidocaine 2.0% The unused portion of the local anesthetic was discarded in the proper designated containers. Technical description of process:  L2 Medial Branch  Nerve Block (MBB): The target area for the L2 medial branch is at the junction of the postero-lateral aspect of the superior articular process and the superior, posterior, and medial edge of the transverse process of L3. Under fluoroscopic guidance, a Quincke needle was inserted until contact was made with os over the superior postero-lateral aspect of the pedicular shadow (target area). After negative aspiration for blood, 0.5 mL of the nerve block solution was injected without difficulty or complication. The needle was removed intact. L3 Medial Branch Nerve Block (MBB): The target area for the L3 medial branch is at the junction of the postero-lateral aspect of the superior articular process and the superior, posterior, and medial edge of the transverse process of L4. Under fluoroscopic guidance, a Quincke needle was inserted until contact was made with os over the superior postero-lateral aspect of the pedicular shadow (target area). After negative aspiration for blood, 0.5 mL of the nerve block solution was injected without difficulty or complication. The needle was removed intact. L4 Medial Branch Nerve Block (MBB): The target area for the L4 medial branch is at the junction of the postero-lateral aspect of the superior articular process and the superior, posterior, and medial edge of the transverse process of L5. Under fluoroscopic guidance, a Quincke needle was inserted until contact was made with os over the superior postero-lateral aspect of the pedicular shadow (target area). After negative aspiration for blood, 0.5 mL of the nerve block solution was injected without difficulty or complication. The needle was removed intact. L5 Medial Branch Nerve Block (MBB): The target area for the L5 medial branch is at the junction of the postero-lateral aspect of the superior articular process and the superior, posterior, and medial edge of the sacral ala. Under fluoroscopic guidance, a Quincke needle was inserted  until contact was made with os over the superior postero-lateral aspect of the pedicular shadow (target area). After negative aspiration for blood, 0.5 mL of the nerve block solution  was injected without difficulty or complication. The needle was removed intact. S1 Medial Branch Nerve Block (MBB): The target area for the S1 medial branch is at the posterior and inferior 6 o'clock position of the L5-S1 facet joint. Under fluoroscopic guidance, the Quincke needle inserted for the L5 MBB was redirected until contact was made with os over the inferior and postero aspect of the sacrum, at the 6 o' clock position under the L5-S1 facet joint (Target area). After negative aspiration for blood, 0.5 mL of the nerve block solution was injected without difficulty or complication. The needle was removed intact.  Once the entire procedure was completed, the treated area was cleaned, making sure to leave some of the prepping solution back to take advantage of its long term bactericidal properties.         Illustration of the posterior view of the lumbar spine and the posterior neural structures. Laminae of L2 through S1 are labeled. DPRL5, dorsal primary ramus of L5; DPRS1, dorsal primary ramus of S1; DPR3, dorsal primary ramus of L3; FJ, facet (zygapophyseal) joint L3-L4; I, inferior articular process of L4; LB1, lateral branch of dorsal primary ramus of L1; IAB, inferior articular branches from L3 medial branch (supplies L4-L5 facet joint); IBP, intermediate branch plexus; MB3, medial branch of dorsal primary ramus of L3; NR3, third lumbar nerve root; S, superior articular process of L5; SAB, superior articular branches from L4 (supplies L4-5 facet joint also); TP3, transverse process of L3.   Facet Joint Innervation (* possible contribution)  L1-2 T12, L1 (L2*)  Medial Branch  L2-3 L1, L2 (L3*)         "          "  L3-4 L2, L3 (L4*)         "          "  L4-5 L3, L4 (L5*)         "          "  L5-S1 L4, L5, S1           "          "    Vitals:   02/19/23 1113 02/19/23 1118 02/19/23 1122 02/19/23 1128  BP: (!) 172/117 (!) 165/124 (!) 164/122 (!) 145/96  Pulse:    (!) 53  Resp: 16 16 13 16   Temp:      TempSrc:      SpO2: 99% 100% 99% 100%  Weight:      Height:         End Time: 1121 hrs.  Imaging Guidance (Spinal):          Type of Imaging Technique: Fluoroscopy Guidance (Spinal) Indication(s): Assistance in needle guidance and placement for procedures requiring needle placement in or near specific anatomical locations not easily accessible without such assistance. Exposure Time: Please see nurses notes. Contrast: None used. Fluoroscopic Guidance: I was personally present during the use of fluoroscopy. "Tunnel Vision Technique" used to obtain the best possible view of the target area. Parallax error corrected before commencing the procedure. "Direction-depth-direction" technique used to introduce the needle under continuous pulsed fluoroscopy. Once target was reached, antero-posterior, oblique, and lateral fluoroscopic projection used confirm needle placement in all planes. Images permanently stored in EMR. Interpretation: No contrast injected. I personally interpreted the imaging intraoperatively. Adequate needle placement confirmed in multiple planes. Permanent images saved into the patient's record.  Post-operative Assessment:  Post-procedure Vital Signs:  Pulse/HCG Rate: (!) 53(!) 53 Temp: (!) 97.3  F (36.3 C) Resp: 16 BP: (!) 145/96 (asymptomatic) SpO2: 100 %  EBL: None  Complications: No immediate post-treatment complications observed by team, or reported by patient.  Note: The patient tolerated the entire procedure well. A repeat set of vitals were taken after the procedure and the patient was kept under observation following institutional policy, for this type of procedure. Post-procedural neurological assessment was performed, showing return to baseline, prior to discharge. The  patient was provided with post-procedure discharge instructions, including a section on how to identify potential problems. Should any problems arise concerning this procedure, the patient was given instructions to immediately contact us, at any time, without hesitation. In any case, we plan to contact the patient by telephone for a follow-up status report regarding this interventional procedure.  Comments:  No additional relevant information.  Plan of Care (POC)  Orders:  Orders Placed This Encounter  Procedures   LUMBAR FACET(MEDIAL BRANCH NERVE BLOCK) MBNB    Scheduling Instructions:     Procedure: Lumbar facet block (AKA.: Lumbosacral medial branch nerve block)     Side: Bilateral     Level: L3-4, L4-5, L5-S1, and TBD Facets (L2, L3, L4, L5, S1, and TBD Medial Branch Nerves)     Sedation: Patient's choice.     Timeframe: Today    Order Specific Question:   Where will this procedure be performed?    Answer:   [facility]   DG PAIN [facility] C-ARM 1-60 MIN NO REPORT    Intraoperative interpretation by procedural physician at [facility].    Standing Status:   Standing    Number of Occurrences:   1    Order Specific Question:   Reason for exam:    Answer:   Assistance in needle guidance and placement for procedures requiring needle placement in or near specific anatomical locations not easily accessible without such assistance.   Informed Consent Details: Physician/Practitioner Attestation; Transcribe to consent form and obtain patient signature    Nursing Order: Transcribe to consent form and obtain patient signature. Note: Always confirm laterality of pain with Ms. Waxman, before procedure.    Order Specific Question:   Physician/Practitioner attestation of informed consent for procedure/surgical case    Answer:   I, the physician/practitioner, attest that I have discussed with the patient the benefits, risks, side effects, alternatives, likelihood of achieving goals and  potential problems during recovery for the procedure that I have provided informed consent.    Order Specific Question:   Procedure    Answer:   Lumbar Facet Block  under fluoroscopic guidance    Order Specific Question:   Physician/Practitioner performing the procedure    Answer:   Ryker Pherigo A. Laban Emperor MD    Order Specific Question:   Indication/Reason    Answer:   Low Back Pain, with our without leg pain, due to Facet Joint Arthralgia (Joint Pain) Spondylosis (Arthritis of the Spine), without myelopathy or radiculopathy (Nerve Damage).   Care order/instruction: Please confirm that the patient has stopped the Xarelto (Rivaroxaban) x 3 days prior to procedure or surgery.    Please confirm that the patient has stopped the Xarelto (Rivaroxaban) x 3 days prior to procedure or surgery.    Standing Status:   Standing    Number of Occurrences:   1   Provide equipment / supplies at bedside    Procedure tray: "Block Tray" (Disposable  single use) Skin infiltration needle: Regular 1.5-in, 25-G, (x1) Block Needle type: Spinal Amount/quantity: 4 Size: Regular (3.5-inch)  Gauge: 22G    Standing Status:   Standing    Number of Occurrences:   1    Order Specific Question:   Specify    Answer:   Block Tray   Bleeding precautions    Standing Status:   Standing    Number of Occurrences:   1   Chronic Opioid Analgesic:  No opioid analgesics prescribed by our practice.  The patient received a letter from [facility] indicating that they would not be paying for her oxycodone prescriptions.   Medications ordered for procedure: Meds ordered this encounter  Medications   lidocaine (XYLOCAINE) 2 % (with pres) injection 400 mg   pentafluoroprop-tetrafluoroeth (GEBAUERS) aerosol   lactated ringers infusion   midazolam (VERSED) injection 0.5-2 mg    Make sure Flumazenil is available in the pyxis when using this medication. If oversedation occurs, administer 0.2 mg IV over 15 sec. If after 45 sec no response,  administer 0.2 mg again over 1 min; may repeat at 1 min intervals; not to exceed 4 doses (1 mg)   ropivacaine (PF) 2 mg/mL (0.2%) (NAROPIN) injection 18 mL   triamcinolone acetonide (KENALOG-40) injection 80 mg   Medications administered: We administered lidocaine, lactated ringers, midazolam, ropivacaine (PF) 2 mg/mL (0.2%), and triamcinolone acetonide.  See the medical record for exact dosing, route, and time of administration.  Follow-up plan:   Return in about 2 weeks (around 03/05/2023) for Proc-day (T,Th), (Face2F), (PPE).       Interventional Therapies  Risk Factors  Considerations:   Anticoagulation: Xarelto. (Stop: 3 days  Restart: 6 hrs)  Allergy: Iodine and radiological contrast  Implant: Internal cardiac defibrillator  History of noncompliance with post procedure follow-ups. (Prior patient of Dr. Ewing Schlein) NOT a candidate for Lumbar RFA 2ry to Fusion Hardware & implanted Cardiac Defibrillator.     PROCEDURES (Non-compliant w/ request to stay still.)  (11/13/2022) Unfortunately this patient continues to be noncompliant with our intraprocedure instructions not to move.  Today's procedure was again a lot longer than it should have been and it unnecessarily increased exposure to the patient as well as the staff to the radiation from fluoroscopic guidance.  For this reason, I am hesitant to offer this type of procedure to the patient again.   Planned  Pending:   NO MORE PROCEDURES (Non-compliant w/ request to stay still.)  (11/13/2022)  She is not a good candidate for radiofrequency ablation secondary to the implanted hardware that she has in her lumbar spine.   Under consideration:   NO MORE PROCEDURES (Non-compliant w/ request to stay still.)    Completed:   Therapeutic left lumbar facet MBB x10 (11/13/2022) (100/100/50/100)  Therapeutic right lumbar facet MBB x11 (11/13/2022) (100/100/50/100)  Therapeutic bilateral L5 TFESI x2 (05/31/2019) (80/80/80/>75)  Therapeutic  right lumbar facet RFA x1 (02/07/2020) (DO NOT REPEAT - FUSION hardware)   Completed by other providers:   Diagnostic right lumbar facet MBB x1 (03/12/2015) (n/a) by Dr. Ewing Schlein  Diagnostic/therapeutic right SI joint NB x3 (02/05/2015) (n/a) by Dr. Ewing Schlein  Lumbar spine surgery: ALIF L4-5, PSF L4-S1 (Dr. Volanda Napoleon) (09/29/2019)    Therapeutic  Palliative (PRN) options:   NO MORE PROCEDURES (Non-compliant w/ request to stay still.)    Pharmacotherapy  No chronic opioid analgesics therapy prescribed by our practice.          Recent Visits Date Type Provider Dept  02/17/23 Office Visit Delano Metz, MD [facility]  11/27/22 Office Visit Delano Metz, MD [facility]  Showing recent visits within past 90 days and meeting all other requirements Today's Visits Date Type Provider Dept  02/19/23 Procedure visit Delano Metz, MD [facility]  Showing today's visits and meeting all other requirements Future Appointments Date Type Provider Dept  03/19/23 Appointment Delano Metz, MD [facility]  Showing future appointments within next 90 days and meeting all other requirements  Disposition: Discharge home  Discharge (Date  Time): 02/19/2023;   hrs.   Primary Care Physician: Rolm Bookbinder, MD Location: [facility] Note by: Oswaldo Done, MD (TTS technology used. I apologize for any typographical errors that were not detected and corrected.) Date: 02/19/2023; Time: 11:33 AM  Disclaimer:  Medicine is not an Visual merchandiser. The only guarantee in medicine is that nothing is guaranteed. It is important to note that the decision to proceed with this intervention was based on the information collected from the patient. The Data and conclusions were drawn from the patient's questionnaire, the interview, and the physical examination. Because the information was provided in large part  by the patient, it cannot be guaranteed that it has not been purposely or unconsciously manipulated. Every effort has been made to obtain as much relevant data as possible for this evaluation. It is important to note that the conclusions that lead to this procedure are derived in large part from the available data. Always take into account that the treatment will also be dependent on availability of resources and existing treatment guidelines, considered by other Pain Management Practitioners as being common knowledge and practice, at the time of the intervention. For Medico-Legal purposes, it is also important to point out that variation in procedural techniques and pharmacological choices are the acceptable norm. The indications, contraindications, technique, and results of the above procedure should only be interpreted and judged by a Board-Certified Interventional Pain Specialist with extensive familiarity and expertise in the same exact procedure and technique.

## 2023-02-20 ENCOUNTER — Telehealth: Payer: Self-pay | Admitting: *Deleted

## 2023-02-20 NOTE — Telephone Encounter (Signed)
Post procedure call;  voicemail left.   

## 2023-03-18 NOTE — Progress Notes (Unsigned)
Unsuccessful attempt to contact patient for Virtual Visit (Pain Management Telehealth)   Patient provided contact information:  [phone] (home); [phone] (mobile); (Preferred) [phone] [name]   Pre-screening:  Our staff was successful in contacting Claire Mcdonald using the above provided information.   I unsuccessfully attempted to make contact with Claire Mcdonald twice on 03/19/2023 via telephone. I was unable to complete the virtual encounter due to call going directly to voicemail. I was able to leave a message where I clearly identify myself as Oswaldo Done, MD and I left a message to call us back to reschedule the call.  Post-procedure evaluation   Procedure: Lumbar Facet, Medial Branch Block(s)  #11  (2nd of 2024) Laterality: Bilateral  Level: L2, L3, L4, L5, and S1 Medial Branch Level(s). Injecting these levels blocks the L3-4, L4-5, and L5-S1 lumbar facet joints. Imaging: Fluoroscopic guidance         Anesthesia: Local anesthesia (1-2% Lidocaine) Anxiolysis: IV Versed         Sedation: No Sedation                       DOS: 02/19/2023 Performed by: Oswaldo Done, MD  Primary Purpose: Diagnostic/Therapeutic Indications: Low back pain severe enough to impact quality of life or function. 1. Chronic low back pain (Bilateral) w/o sciatica   2. Lumbar facet syndrome (Bilateral) (R>L)   3. Lumbar facet arthropathy   4. Grade 2-3 (13-16 mm) Anterolisthesis of L5/S1 (06/22/2019)   5. DDD (degenerative disc disease), lumbosacral   6. Lumbar facet joint pain   7. Implanted hardware pain (pedicle screws)   8. History of fusion of lumbar spine    NAS-11 Pain score:   Pre-procedure: 9 /10   Post-procedure: 0-No pain/10      Effectiveness:  Initial hour after procedure: 100 %. Subsequent 4-6 hours post-procedure: 100 %. Analgesia past initial 6 hours: 100 %.  Pharmacotherapy Assessment  Analgesic: No opioid analgesics prescribed by our practice.  The  patient received a letter from [facility] indicating that they would not be paying for her oxycodone prescriptions.   Follow-up plan:   Reschedule Visit.    Interventional Therapies  Risk Factors  Considerations:   Anticoagulation: Xarelto. (Stop: 3 days  Restart: 6 hrs)  Allergy: Iodine and radiological contrast  Implant: Internal cardiac defibrillator  History of noncompliance with post procedure follow-ups. (Prior patient of Dr. Ewing Schlein) NOT a candidate for Lumbar RFA 2ry to Fusion Hardware & implanted Cardiac Defibrillator.     PROCEDURES (Non-compliant w/ request to stay still.)  (11/13/2022) Unfortunately this patient continues to be noncompliant with our intraprocedure instructions not to move.  Today's procedure was again a lot longer than it should have been and it unnecessarily increased exposure to the patient as well as the staff to the radiation from fluoroscopic guidance.  For this reason, I am hesitant to offer this type of procedure to the patient again.   Planned  Pending:   NO MORE PROCEDURES (Non-compliant w/ request to stay still.)  (11/13/2022)  She is not a good candidate for radiofrequency ablation secondary to the implanted hardware that she has in her lumbar spine.   Under consideration:   NO MORE PROCEDURES (Non-compliant w/ request to stay still.)    Completed:   Therapeutic left lumbar facet MBB x10 (11/13/2022) (100/100/50/100)  Therapeutic right lumbar facet MBB x11 (11/13/2022) (100/100/50/100)  Therapeutic bilateral L5 TFESI x2 (05/31/2019) (80/80/80/>75)  Therapeutic right lumbar facet RFA x1 (02/07/2020) (  DO NOT REPEAT - FUSION hardware)   Completed by other providers:   Diagnostic right lumbar facet MBB x1 (03/12/2015) (n/a) by Dr. Ewing Schlein  Diagnostic/therapeutic right SI joint NB x3 (02/05/2015) (n/a) by Dr. Ewing Schlein  Lumbar spine surgery: ALIF L4-5, PSF L4-S1 (Dr. Volanda Napoleon) (09/29/2019)    Therapeutic  Palliative (PRN)  options:   NO MORE PROCEDURES (Non-compliant w/ request to stay still.)    Pharmacotherapy  No chronic opioid analgesics therapy prescribed by our practice.          Recent Visits Date Type Provider Dept  02/19/23 Procedure visit Delano Metz, MD [facility]  02/17/23 Office Visit Delano Metz, MD [facility]  Showing recent visits within past 90 days and meeting all other requirements Today's Visits Date Type Provider Dept  03/19/23 Office Visit Delano Metz, MD [facility]  Showing today's visits and meeting all other requirements Future Appointments No visits were found meeting these conditions. Showing future appointments within next 90 days and meeting all other requirements   Note by: Oswaldo Done, MD Date: 03/19/2023; Time: 4:05 PM

## 2023-03-19 ENCOUNTER — Telehealth: Payer: Self-pay

## 2023-03-19 ENCOUNTER — Telehealth: Payer: Self-pay | Admitting: *Deleted

## 2023-03-19 ENCOUNTER — Encounter: Payer: Self-pay | Admitting: Pain Medicine

## 2023-03-19 ENCOUNTER — Ambulatory Visit: Payer: MEDICAID | Attending: Pain Medicine | Admitting: Pain Medicine

## 2023-03-19 DIAGNOSIS — M47816 Spondylosis without myelopathy or radiculopathy, lumbar region: Secondary | ICD-10-CM

## 2023-03-19 DIAGNOSIS — G8929 Other chronic pain: Secondary | ICD-10-CM

## 2023-03-19 DIAGNOSIS — T85848S Pain due to other internal prosthetic devices, implants and grafts, sequela: Secondary | ICD-10-CM

## 2023-03-19 DIAGNOSIS — Z09 Encounter for follow-up examination after completed treatment for conditions other than malignant neoplasm: Secondary | ICD-10-CM

## 2023-03-19 DIAGNOSIS — M545 Low back pain, unspecified: Secondary | ICD-10-CM

## 2023-03-19 DIAGNOSIS — M5459 Other low back pain: Secondary | ICD-10-CM

## 2023-03-19 NOTE — Telephone Encounter (Signed)
[name] called [name] back regarding appt. I wasn't sure what the call was about.

## 2023-03-19 NOTE — Telephone Encounter (Signed)
Attempted to call for pre appointment review of allergies and meds and PPE. Message left.

## 2024-06-10 ENCOUNTER — Emergency Department: Payer: MEDICAID

## 2024-06-10 ENCOUNTER — Emergency Department
Admission: EM | Admit: 2024-06-10 | Discharge: 2024-06-10 | Disposition: A | Discharge status: Home / Self Care | Payer: MEDICAID | Attending: Emergency Medicine | Admitting: Emergency Medicine

## 2024-06-10 ENCOUNTER — Other Ambulatory Visit: Payer: Self-pay

## 2024-06-10 DIAGNOSIS — W01198A Fall on same level from slipping, tripping and stumbling with subsequent striking against other object, initial encounter: Secondary | ICD-10-CM | POA: Insufficient documentation

## 2024-06-10 DIAGNOSIS — E876 Hypokalemia: Secondary | ICD-10-CM | POA: Diagnosis not present

## 2024-06-10 DIAGNOSIS — R531 Weakness: Secondary | ICD-10-CM

## 2024-06-10 DIAGNOSIS — M25511 Pain in right shoulder: Secondary | ICD-10-CM | POA: Diagnosis not present

## 2024-06-10 DIAGNOSIS — R197 Diarrhea, unspecified: Secondary | ICD-10-CM | POA: Insufficient documentation

## 2024-06-10 DIAGNOSIS — I82409 Acute embolism and thrombosis of unspecified deep veins of unspecified lower extremity: Secondary | ICD-10-CM | POA: Insufficient documentation

## 2024-06-10 DIAGNOSIS — M79604 Pain in right leg: Secondary | ICD-10-CM | POA: Insufficient documentation

## 2024-06-10 DIAGNOSIS — S0990XA Unspecified injury of head, initial encounter: Secondary | ICD-10-CM | POA: Insufficient documentation

## 2024-06-10 DIAGNOSIS — I2510 Atherosclerotic heart disease of native coronary artery without angina pectoris: Secondary | ICD-10-CM | POA: Insufficient documentation

## 2024-06-10 DIAGNOSIS — Z7901 Long term (current) use of anticoagulants: Secondary | ICD-10-CM | POA: Diagnosis not present

## 2024-06-10 DIAGNOSIS — M25561 Pain in right knee: Secondary | ICD-10-CM | POA: Insufficient documentation

## 2024-06-10 DIAGNOSIS — M25551 Pain in right hip: Secondary | ICD-10-CM | POA: Insufficient documentation

## 2024-06-10 LAB — URINALYSIS, W/ REFLEX TO CULTURE (INFECTION SUSPECTED)
Bilirubin Urine: NEGATIVE
Glucose, UA: NEGATIVE mg/dL
Hgb urine dipstick: NEGATIVE
Ketones, ur: NEGATIVE mg/dL
Nitrite: NEGATIVE
Protein, ur: NEGATIVE mg/dL
Specific Gravity, Urine: 1.017 (ref 1.005–1.030)
pH: 5 (ref 5.0–8.0)

## 2024-06-10 LAB — CBC WITH DIFFERENTIAL/PLATELET
Abs Immature Granulocytes: 0.04 K/uL (ref 0.00–0.07)
Basophils Absolute: 0 K/uL (ref 0.0–0.1)
Basophils Relative: 0 %
Eosinophils Absolute: 0.2 K/uL (ref 0.0–0.5)
Eosinophils Relative: 2 %
HCT: 45.9 % (ref 36.0–46.0)
Hemoglobin: 14.9 g/dL (ref 12.0–15.0)
Immature Granulocytes: 0 %
Lymphocytes Relative: 5 %
Lymphs Abs: 0.5 K/uL — ABNORMAL LOW (ref 0.7–4.0)
MCH: 30.2 pg (ref 26.0–34.0)
MCHC: 32.5 g/dL (ref 30.0–36.0)
MCV: 92.9 fL (ref 80.0–100.0)
Monocytes Absolute: 0.5 K/uL (ref 0.1–1.0)
Monocytes Relative: 5 %
Neutro Abs: 8.3 K/uL — ABNORMAL HIGH (ref 1.7–7.7)
Neutrophils Relative %: 88 %
Platelets: 234 K/uL (ref 150–400)
RBC: 4.94 MIL/uL (ref 3.87–5.11)
RDW: 14 % (ref 11.5–15.5)
WBC: 9.5 K/uL (ref 4.0–10.5)
nRBC: 0 % (ref 0.0–0.2)

## 2024-06-10 LAB — COMPREHENSIVE METABOLIC PANEL WITH GFR
ALT: 30 U/L (ref 0–44)
AST: 68 U/L — ABNORMAL HIGH (ref 15–41)
Albumin: 4.4 g/dL (ref 3.5–5.0)
Alkaline Phosphatase: 97 U/L (ref 38–126)
Anion gap: 14 (ref 5–15)
BUN: 28 mg/dL — ABNORMAL HIGH (ref 6–20)
CO2: 21 mmol/L — ABNORMAL LOW (ref 22–32)
Calcium: 8.8 mg/dL — ABNORMAL LOW (ref 8.9–10.3)
Chloride: 107 mmol/L (ref 98–111)
Creatinine, Ser: 0.92 mg/dL (ref 0.44–1.00)
GFR, Estimated: >60 mL/min (ref >60)
Glucose, Bld: 70 mg/dL (ref 70–99)
Potassium: 3.1 mmol/L — ABNORMAL LOW (ref 3.5–5.1)
Sodium: 142 mmol/L (ref 135–145)
Total Bilirubin: 0.5 mg/dL (ref 0.0–1.2)
Total Protein: 7.6 g/dL (ref 6.5–8.1)

## 2024-06-10 MED ORDER — PROMETHAZINE HCL 25 MG/ML IJ SOLN
12.5000 mg | Freq: Once | INTRAMUSCULAR | Status: AC
  Administered 2024-06-10: 12.5 mg via INTRAVENOUS
  Filled 2024-06-10: qty 12.5

## 2024-06-10 MED ORDER — METOCLOPRAMIDE HCL 5 MG/ML IJ SOLN
10.0000 mg | Freq: Once | INTRAMUSCULAR | Status: AC
  Administered 2024-06-10: 10 mg via INTRAVENOUS
  Filled 2024-06-10: qty 2

## 2024-06-10 MED ORDER — SODIUM CHLORIDE 0.9 % IV BOLUS
1000.0000 mL | Freq: Once | INTRAVENOUS | Status: AC
  Administered 2024-06-10: 1000 mL via INTRAVENOUS

## 2024-06-10 MED ORDER — MORPHINE SULFATE (PF) 4 MG/ML IV SOLN
4.0000 mg | Freq: Once | INTRAVENOUS | Status: AC
  Administered 2024-06-10: 4 mg via INTRAVENOUS
  Filled 2024-06-10: qty 1

## 2024-06-10 MED ORDER — POTASSIUM CHLORIDE CRYS ER 20 MEQ PO TBCR
40.0000 meq | EXTENDED_RELEASE_TABLET | Freq: Once | ORAL | Status: AC
  Administered 2024-06-10: 40 meq via ORAL
  Filled 2024-06-10: qty 2

## 2024-06-10 NOTE — ED Triage Notes (Signed)
 From home vis EMS, generalized weakness and fatigue for 1 week, UTI diagnosed last week, Pt complains of N/V, pt experience fall early today causing right knee pain, history of stroke, decrease sensation right arm side due to past stroke.

## 2024-06-10 NOTE — Discharge Instructions (Signed)
 You are seen in the emergency department today for weakness and fall.  Your testing fortunately did not show any serious injuries and was overall reassuring.  Follow-up with your primary care doctor for further evaluation of your weakness.  Your urine test here fortunately did not show signs of a significant ongoing infection.  Return to the ER for new or worsening symptoms.

## 2024-06-10 NOTE — ED Provider Notes (Signed)
 [facility] Provider Note    Event Date/Time   First MD Initiated Contact with Patient 06/10/24 [phone]     (approximate)   History   Fall, Weakness, and Emesis   HPI  Claire Mcdonald is a 58 year old female with history of CAD with prior deficits, DVT on Xarelto , CAD presenting to the emergency department for evaluation of weakness.  Patient reports she has felt generally weak over the last week.  Was seen at outside ER a week ago and diagnosed with UTI.  Reports that she has had frequent nonbloody diarrhea since that time estimates 3-5 episodes a day.  Has a history of C. difficile and is concerned that the antibiotics may have precipitated this again.  No fevers.  Today, while in the shower her legs felt weak and she collapsed onto the ground.  Did hit her head.  No LOC.  Reports pain over her head, neck, right shoulder and leg.  No new numbness, tingling, focal weakness.    Physical Exam   Triage Vital Signs: ED Triage Vitals  Encounter Vitals Group     BP 06/10/24 0838 108/76     Girls Systolic BP Percentile --      Girls Diastolic BP Percentile --      Boys Systolic BP Percentile --      Boys Diastolic BP Percentile --      Pulse Rate 06/10/24 0838 64     Resp 06/10/24 0838 (!) 25     Temp 06/10/24 0838 98 F (36.7 C)     Temp Source 06/10/24 0838 Oral     SpO2 06/10/24 0838 100 %     Weight 06/10/24 0842 160 lb (72.6 kg)     Height 06/10/24 0842 5' 1 (1.549 m)     Head Circumference --      Peak Flow --      Pain Score 06/10/24 0839 8     Pain Loc --      Pain Education --      Exclude from Growth Chart --     Most recent vital signs: Vitals:   06/10/24 1330 06/10/24 1413  BP: 98/72 103/89  Pulse:  75  Resp: 10 17  Temp:    SpO2:  99%    Nursing notes and vital signs reviewed.  General: Adult female, laying in bed, awake, no active Head: Atraumatic  Neck: Tenderness throughout the posterior neck Chest: Symmetric chest rise, no  tenderness to palpation.  Cardiac: Regular rhythm and rate.  Respiratory: Lungs clear to auscultation Abdomen: Soft, nondistended. No tenderness to palpation.  Pelvis: Stable in AP and lateral compression.  MSK: No deformity to bilateral upper and lower extremity.  Tenderness to palpation of the right shoulder, hip, knee Neuro: Alert, oriented. GCS 15.  Deficits consistent with prior UAs. Skin: No evidence of burns or lacerations.   ED Results / Procedures / Treatments   Labs (all labs ordered are listed, but only abnormal results are displayed) Labs Reviewed  CBC WITH DIFFERENTIAL/PLATELET - Abnormal; Notable for the following components:      Result Value   Neutro Abs 8.3 (*)    Lymphs Abs 0.5 (*)    All other components within normal limits  COMPREHENSIVE METABOLIC PANEL WITH GFR - Abnormal; Notable for the following components:   Potassium 3.1 (*)    CO2 21 (*)    BUN 28 (*)    Calcium  8.8 (*)    AST 68 (*)  All other components within normal limits  URINALYSIS, W/ REFLEX TO CULTURE (INFECTION SUSPECTED) - Abnormal; Notable for the following components:   Color, Urine YELLOW (*)    APPearance CLOUDY (*)    Leukocytes,Ua SMALL (*)    Bacteria, UA RARE (*)    All other components within normal limits  URINE CULTURE     EKG EKG independently reviewed and interpreted by myself demonstrates:  EKG demonstrates normal sinus 64, PR 172, QRS 90, QTc 447, no acute ST changes  RADIOLOGY Imaging independently reviewed and interpreted by myself demonstrates:  CT head without acute bleed CT C-spine without acute fracture  Extremity x-rays without acute fracture   Formal Radiology Read:  DG Knee Complete 4 Views Right Result Date: 06/10/2024 EXAM: 4 VIEW(S) XRAY OF THE RIGHT KNEE 06/10/2024 09:44:00 AM COMPARISON: Comparison 12/27/2014. Moderate narrowing of lateral joint space is noted. CLINICAL HISTORY: Fall. Fall this morning out of shower. Right shoulder, right hip, and  right knee pain. FINDINGS: BONES AND JOINTS: No acute fracture. No focal osseous lesion. No joint dislocation. No significant joint effusion. Moderate narrowing of lateral joint space is noted. SOFT TISSUES: The soft tissues are unremarkable. IMPRESSION: 1. No acute fracture or dislocation. 2. Moderate lateral compartment joint space narrowing. Electronically signed by: Lynwood Seip MD 06/10/2024 10:08 AM EDT RP Workstation: [id number]   DG Hip Unilat With Pelvis 2-3 Views Right Result Date: 06/10/2024 EXAM: 2 or 3 VIEW(S) XRAY OF THE RIGHT HIP 06/10/2024 09:44:00 AM COMPARISON: None available. CLINICAL HISTORY: fall. Fall this morning out of shower. Right shoulder, right hip, and right knee pain. Previous right hip injury with hip replacement. FINDINGS: BONES AND JOINTS: Status post right hip hemiarthroplasty. SOFT TISSUES: The soft tissues are unremarkable. IMPRESSION: 1. No acute fracture or dislocation. 2. Status post right hip hemiarthroplasty. Electronically signed by: Lynwood Seip MD 06/10/2024 10:06 AM EDT RP Workstation: [id number]   DG Shoulder Right Result Date: 06/10/2024 EXAM: 1 VIEW XRAY OF THE RIGHT SHOULDER 06/10/2024 09:44:00 AM COMPARISON: None available. CLINICAL HISTORY: fall. Fall this morning out of shower. Right shoulder, right hip, and right knee pain. FINDINGS: BONES AND JOINTS: Glenohumeral joint is normally aligned. No acute fracture or dislocation. Moderate degenerative change is seen involving right acromioclavicular joint. SOFT TISSUES: No abnormal calcifications. Visualized lung is unremarkable. IMPRESSION: 1. No acute fracture or dislocation. 2. Moderate right acromioclavicular joint osteoarthritis. Electronically signed by: Lynwood Seip MD 06/10/2024 10:04 AM EDT RP Workstation: [id number]   CT ABDOMEN PELVIS WO CONTRAST Result Date: 06/10/2024 EXAM: CT ABDOMEN AND PELVIS WITHOUT CONTRAST 06/10/2024 09:23:04 AM TECHNIQUE: CT of the abdomen and pelvis was performed without  the administration of intravenous contrast. Multiplanar reformatted images are provided for review. Automated exposure control, iterative reconstruction, and/or weight-based adjustment of the mA/kV was utilized to reduce the radiation dose to as low as reasonably achievable. COMPARISON: CT of the abdomen and pelvis dated 12/22/2021. CLINICAL HISTORY: Abdominal pain, acute, nonlocalized. Generalized weakness and fatigue for 1 week. UTI diagnosed last week. Patient complains of N/V. Patient experienced a fall early today causing right knee pain. History of stroke, decreased sensation right arm side due to past stroke. Unable to get arms up for abdomen/pelvis. FINDINGS: LOWER CHEST: No acute abnormality. LIVER: The liver is unremarkable. GALLBLADDER AND BILE DUCTS: Gallbladder is unremarkable. No biliary ductal dilatation. SPLEEN: No acute abnormality. PANCREAS: No acute abnormality. ADRENAL GLANDS: No acute abnormality. KIDNEYS, URETERS AND BLADDER: No stones in the kidneys or ureters. No hydronephrosis. No perinephric or periureteral stranding.  Urinary bladder is unremarkable. GI AND BOWEL: Stomach demonstrates no acute abnormality. There are numerous colonic diverticula present. There is no bowel obstruction. PERITONEUM AND RETROPERITONEUM: No ascites. No free air. VASCULATURE: Aorta is normal in caliber. LYMPH NODES: No lymphadenopathy. REPRODUCTIVE ORGANS: Status post hysterectomy and bilateral salpingo-oophorectomy. BONES AND SOFT TISSUES: Status post right total hip arthroplasty. Status post interbody fusion and bilateral posterolateral spinal fixation at L4-L5 and L5-S1. No acute osseous abnormality. No focal soft tissue abnormality. IMPRESSION: 1. No acute findings in the abdomen or pelvis. Electronically signed by: Evalene Coho MD 06/10/2024 10:00 AM EDT RP Workstation: [id number]   CT Cervical Spine Wo Contrast Result Date: 06/10/2024 EXAM: CT CERVICAL SPINE WITHOUT CONTRAST 06/10/2024 09:23:04 AM  TECHNIQUE: CT of the cervical spine was performed without the administration of intravenous contrast. Multiplanar reformatted images are provided for review. Automated exposure control, iterative reconstruction, and/or weight based adjustment of the mA/kV was utilized to reduce the radiation dose to as low as reasonably achievable. COMPARISON: None available. CLINICAL HISTORY: Neck trauma, midline tenderness (Age 67-64y). generalized weakness and fatigue for 1 week, UTI diagnosed last week, Pt complains of N/V, pt experience fall early today causing right knee pain, history of stroke, decrease sensation right arm side due to past stroke. FINDINGS: BONES AND ALIGNMENT: No acute fracture or traumatic malalignment. DEGENERATIVE CHANGES: No significant degenerative changes. SOFT TISSUES: No prevertebral soft tissue swelling. IMPRESSION: 1. No acute abnormality of the cervical spine. Electronically signed by: Evalene Coho MD 06/10/2024 09:39 AM EDT RP Workstation: [id number]   CT Head Wo Contrast Result Date: 06/10/2024 EXAM: CT HEAD WITHOUT CONTRAST 06/10/2024 09:23:04 AM TECHNIQUE: CT of the head was performed without the administration of intravenous contrast. Automated exposure control, iterative reconstruction, and/or weight based adjustment of the mA/kV was utilized to reduce the radiation dose to as low as reasonably achievable. COMPARISON: CT of the head dated 02/25/2020. CLINICAL HISTORY: Head trauma, abnormal mental status (Age 43-64y). generalized weakness and fatigue for 1 week, UTI diagnosed last week, Pt complains of N/V, pt experience fall early today causing right knee pain, history of stroke, decrease sensation right arm side due to past stroke. FINDINGS: BRAIN AND VENTRICLES: No acute hemorrhage. No evidence of acute infarct. 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 No hydrocephalus. No extra-axial collection. No mass effect or midline shift. ORBITS: No acute  abnormality. SINUSES: No acute abnormality. SOFT TISSUES AND SKULL: No acute soft tissue abnormality. No skull fracture. IMPRESSION: 1. No acute intracranial abnormality. 2. 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 Electronically signed by: Evalene Coho MD 06/10/2024 09:39 AM EDT RP Workstation: [id number]    PROCEDURES:  Critical Care performed: No  Procedures   MEDICATIONS ORDERED IN ED: Medications  morphine  (PF) 4 MG/ML injection 4 mg (4 mg Intravenous Given 06/10/24 0902)  metoCLOPramide  (REGLAN ) injection 10 mg (10 mg Intravenous Given 06/10/24 0902)  sodium chloride  0.9 % bolus 1,000 mL (0 mLs Intravenous Stopped 06/10/24 1307)  promethazine  (PHENERGAN ) 12.5 mg in sodium chloride  0.9 % 50 mL IVPB (0 mg Intravenous Stopped 06/10/24 1307)  potassium chloride  SA (KLOR-CON  M) CR tablet 40 mEq (40 mEq Oral Given 06/10/24 1410)     IMPRESSION / MDM / ASSESSMENT AND PLAN / ED COURSE  I reviewed the triage vital signs and the nursing notes.  Differential diagnosis includes, but is not limited to, UTI, colitis, diverticulitis, other acute intrabdominal process, intracranial bleed, skull fracture, spine fracture, anemia, electrolyte abnormality  Patient's presentation is  most consistent with acute presentation with potential threat to life or bodily function.  58 year old female presenting to the ER for evaluation of weakness and fall. Stable vitals on presentation.  Labs with reassuring CBC, CMP with mild hypokalemia, likely related to recent diarrhea.  Trauma imaging fortunately reassuring.  CT abdomen pelvis without acute findings.  Awaiting urinalysis. Do note that patient had urinalysis performed at ER visit on 10/10 with urine culture resulting on 10/13 sensitive to cephalosporins as well as ciprofloxacin , Macrobid, Zosyn.  Resistant to ampicillin, intermediate to Unasyn.  Clinical Course as of 06/10/24 1421  Fri Jun 10, 2024  1419 Urinalysis  overall not suggestive of infection.  Urine culture sent.  Do think it is reasonable to hold off on additional antibiotics given recent UTI treatment.  Unable to provide stool sample here, but suspect more likely antibiotic associated diarrhea as opposed to C. difficile shortly after onset of taking medication.  On reassessment, patient feels much improved.  She is eager to be discharged home.  Reviewed results of workup.  Strict return precautions provided.  Patient discharged in stable condition. [NR]    Clinical Course User Index [NR] Levander Slate, MD     FINAL CLINICAL IMPRESSION(S) / ED DIAGNOSES   Final diagnoses:  Generalized weakness  Closed head injury, initial encounter  Acute pain of right shoulder  Right leg pain     Rx / DC Orders   ED Discharge Orders     None        Note:  This document was prepared using Dragon voice recognition software and may include unintentional dictation errors.   Levander Slate, MD 06/10/24 [phone]

## 2024-06-12 LAB — URINE CULTURE: Culture: NO GROWTH
